# Patient Record
Sex: Male | Born: 1960
Health system: Southern US, Community
[De-identification: ages and names within clinical notes are randomized; demographics above are authoritative.]

## PROBLEM LIST (undated history)

## (undated) DIAGNOSIS — N529 Male erectile dysfunction, unspecified: Secondary | ICD-10-CM

## (undated) DIAGNOSIS — E1165 Type 2 diabetes mellitus with hyperglycemia: Secondary | ICD-10-CM

## (undated) DIAGNOSIS — K219 Gastro-esophageal reflux disease without esophagitis: Secondary | ICD-10-CM

## (undated) DIAGNOSIS — I639 Cerebral infarction, unspecified: Secondary | ICD-10-CM

## (undated) DIAGNOSIS — F1910 Other psychoactive substance abuse, uncomplicated: Secondary | ICD-10-CM

## (undated) DIAGNOSIS — G4730 Sleep apnea, unspecified: Secondary | ICD-10-CM

## (undated) DIAGNOSIS — E291 Testicular hypofunction: Secondary | ICD-10-CM

## (undated) DIAGNOSIS — M1990 Unspecified osteoarthritis, unspecified site: Secondary | ICD-10-CM

## (undated) DIAGNOSIS — R011 Cardiac murmur, unspecified: Secondary | ICD-10-CM

## (undated) DIAGNOSIS — I219 Acute myocardial infarction, unspecified: Secondary | ICD-10-CM

## (undated) DIAGNOSIS — I10 Essential (primary) hypertension: Secondary | ICD-10-CM

## (undated) DIAGNOSIS — E785 Hyperlipidemia, unspecified: Secondary | ICD-10-CM

## (undated) HISTORY — PX: COLONOSCOPY: SHX174

## (undated) HISTORY — DX: Cardiac murmur, unspecified: R01.1

## (undated) HISTORY — PX: OTHER SURGICAL HISTORY: SHX169

## (undated) HISTORY — DX: Hyperlipidemia, unspecified: E78.5

## (undated) HISTORY — DX: Type 2 diabetes mellitus with hyperglycemia: E11.65

## (undated) HISTORY — DX: Male erectile dysfunction, unspecified: N52.9

## (undated) HISTORY — DX: Testicular hypofunction: E29.1

## (undated) HISTORY — DX: Essential (primary) hypertension: I10

## (undated) HISTORY — PX: CARDIAC CATHETERIZATION: SHX172

## (undated) HISTORY — DX: Other psychoactive substance abuse, uncomplicated: F19.10

---

## 2000-03-06 ENCOUNTER — Encounter: Payer: Self-pay | Admitting: Family Medicine

## 2000-03-06 ENCOUNTER — Ambulatory Visit (HOSPITAL_COMMUNITY): Admission: RE | Admit: 2000-03-06 | Discharge: 2000-03-06 | Payer: Self-pay | Admitting: Family Medicine

## 2001-09-03 ENCOUNTER — Ambulatory Visit (HOSPITAL_BASED_OUTPATIENT_CLINIC_OR_DEPARTMENT_OTHER): Admission: RE | Admit: 2001-09-03 | Discharge: 2001-09-03 | Payer: Self-pay | Admitting: Family Medicine

## 2003-01-12 ENCOUNTER — Encounter: Admission: RE | Admit: 2003-01-12 | Discharge: 2003-04-12 | Payer: Self-pay | Admitting: Internal Medicine

## 2010-04-02 LAB — HM COLONOSCOPY

## 2010-04-02 LAB — HM DIABETES EYE EXAM

## 2010-08-22 LAB — HM COLONOSCOPY

## 2011-03-23 ENCOUNTER — Ambulatory Visit (INDEPENDENT_AMBULATORY_CARE_PROVIDER_SITE_OTHER): Payer: Managed Care, Other (non HMO)

## 2011-03-23 DIAGNOSIS — R509 Fever, unspecified: Secondary | ICD-10-CM

## 2011-03-23 DIAGNOSIS — E119 Type 2 diabetes mellitus without complications: Secondary | ICD-10-CM

## 2011-03-23 DIAGNOSIS — R197 Diarrhea, unspecified: Secondary | ICD-10-CM

## 2011-03-29 ENCOUNTER — Telehealth: Payer: Self-pay | Admitting: Internal Medicine

## 2011-03-29 NOTE — Telephone Encounter (Signed)
Received copies from [facility] 03/29/11. Forwarded 54pages to Dr. Candi Leash review.

## 2011-04-04 ENCOUNTER — Encounter: Payer: Self-pay | Admitting: Internal Medicine

## 2011-04-04 DIAGNOSIS — L659 Nonscarring hair loss, unspecified: Secondary | ICD-10-CM | POA: Insufficient documentation

## 2011-04-04 DIAGNOSIS — I10 Essential (primary) hypertension: Secondary | ICD-10-CM

## 2011-04-04 DIAGNOSIS — IMO0001 Reserved for inherently not codable concepts without codable children: Secondary | ICD-10-CM

## 2011-04-04 DIAGNOSIS — E785 Hyperlipidemia, unspecified: Secondary | ICD-10-CM

## 2011-04-04 DIAGNOSIS — E291 Testicular hypofunction: Secondary | ICD-10-CM

## 2011-04-04 DIAGNOSIS — Z0000 Encounter for general adult medical examination without abnormal findings: Secondary | ICD-10-CM | POA: Insufficient documentation

## 2011-04-04 DIAGNOSIS — E118 Type 2 diabetes mellitus with unspecified complications: Secondary | ICD-10-CM | POA: Insufficient documentation

## 2011-04-04 DIAGNOSIS — E1169 Type 2 diabetes mellitus with other specified complication: Secondary | ICD-10-CM | POA: Insufficient documentation

## 2011-04-04 DIAGNOSIS — N529 Male erectile dysfunction, unspecified: Secondary | ICD-10-CM

## 2011-04-04 HISTORY — DX: Male erectile dysfunction, unspecified: N52.9

## 2011-04-04 HISTORY — DX: Essential (primary) hypertension: I10

## 2011-04-04 HISTORY — DX: Hyperlipidemia, unspecified: E78.5

## 2011-04-04 HISTORY — DX: Reserved for inherently not codable concepts without codable children: IMO0001

## 2011-04-04 HISTORY — DX: Testicular hypofunction: E29.1

## 2011-04-05 ENCOUNTER — Ambulatory Visit (INDEPENDENT_AMBULATORY_CARE_PROVIDER_SITE_OTHER): Payer: Self-pay | Admitting: Internal Medicine

## 2011-04-05 ENCOUNTER — Other Ambulatory Visit: Payer: Self-pay | Admitting: Internal Medicine

## 2011-04-05 ENCOUNTER — Encounter: Payer: Self-pay | Admitting: Internal Medicine

## 2011-04-05 ENCOUNTER — Other Ambulatory Visit (INDEPENDENT_AMBULATORY_CARE_PROVIDER_SITE_OTHER): Payer: Managed Care, Other (non HMO)

## 2011-04-05 DIAGNOSIS — I10 Essential (primary) hypertension: Secondary | ICD-10-CM

## 2011-04-05 DIAGNOSIS — Z0000 Encounter for general adult medical examination without abnormal findings: Secondary | ICD-10-CM

## 2011-04-05 DIAGNOSIS — IMO0001 Reserved for inherently not codable concepts without codable children: Secondary | ICD-10-CM

## 2011-04-05 DIAGNOSIS — E291 Testicular hypofunction: Secondary | ICD-10-CM

## 2011-04-05 DIAGNOSIS — N529 Male erectile dysfunction, unspecified: Secondary | ICD-10-CM

## 2011-04-05 DIAGNOSIS — E785 Hyperlipidemia, unspecified: Secondary | ICD-10-CM

## 2011-04-05 LAB — URINALYSIS, ROUTINE W REFLEX MICROSCOPIC
Bilirubin Urine: NEGATIVE
Hgb urine dipstick: NEGATIVE
Ketones, ur: NEGATIVE
Leukocytes, UA: NEGATIVE
Nitrite: NEGATIVE
Specific Gravity, Urine: 1.02 (ref 1.000–1.030)
Total Protein, Urine: NEGATIVE
Urine Glucose: 250
Urobilinogen, UA: 0.2 (ref 0.0–1.0)
pH: 6 (ref 5.0–8.0)

## 2011-04-05 LAB — CBC WITH DIFFERENTIAL/PLATELET
Basophils Absolute: 0 10*3/uL (ref 0.0–0.1)
Basophils Relative: 0.4 % (ref 0.0–3.0)
Eosinophils Absolute: 0.1 10*3/uL (ref 0.0–0.7)
Eosinophils Relative: 1.6 % (ref 0.0–5.0)
HCT: 40.4 % (ref 39.0–52.0)
Hemoglobin: 14 g/dL (ref 13.0–17.0)
Lymphocytes Relative: 22.8 % (ref 12.0–46.0)
Lymphs Abs: 1.7 10*3/uL (ref 0.7–4.0)
MCHC: 34.6 g/dL (ref 30.0–36.0)
MCV: 94.1 fl (ref 78.0–100.0)
Monocytes Absolute: 0.5 10*3/uL (ref 0.1–1.0)
Monocytes Relative: 6.2 % (ref 3.0–12.0)
Neutro Abs: 5.3 10*3/uL (ref 1.4–7.7)
Neutrophils Relative %: 69 % (ref 43.0–77.0)
Platelets: 171 10*3/uL (ref 150.0–400.0)
RBC: 4.29 Mil/uL (ref 4.22–5.81)
RDW: 12.8 % (ref 11.5–14.6)
WBC: 7.7 10*3/uL (ref 4.5–10.5)

## 2011-04-05 LAB — LDL CHOLESTEROL, DIRECT: Direct LDL: 99.7 mg/dL

## 2011-04-05 LAB — COMPREHENSIVE METABOLIC PANEL
ALT: 49 U/L (ref 0–53)
AST: 30 U/L (ref 0–37)
Albumin: 4.1 g/dL (ref 3.5–5.2)
Alkaline Phosphatase: 73 U/L (ref 39–117)
BUN: 14 mg/dL (ref 6–23)
CO2: 28 mEq/L (ref 19–32)
Calcium: 9.3 mg/dL (ref 8.4–10.5)
Chloride: 105 mEq/L (ref 96–112)
Creatinine, Ser: 1.2 mg/dL (ref 0.4–1.5)
GFR: 69.77 mL/min (ref >60.00)
Glucose, Bld: 187 mg/dL — ABNORMAL HIGH (ref 70–99)
Potassium: 4.1 mEq/L (ref 3.5–5.1)
Sodium: 140 mEq/L (ref 135–145)
Total Bilirubin: 0.6 mg/dL (ref 0.3–1.2)
Total Protein: 6.4 g/dL (ref 6.0–8.3)

## 2011-04-05 LAB — TSH: TSH: 6.44 u[IU]/mL — ABNORMAL HIGH (ref 0.35–5.50)

## 2011-04-05 LAB — LIPID PANEL
Cholesterol: 172 mg/dL (ref 0–200)
HDL: 29.4 mg/dL — ABNORMAL LOW (ref >39.00)
Total CHOL/HDL Ratio: 6
Triglycerides: 277 mg/dL — ABNORMAL HIGH (ref 0.0–149.0)
VLDL: 55.4 mg/dL — ABNORMAL HIGH (ref 0.0–40.0)

## 2011-04-05 LAB — PSA: PSA: 0.85 ng/mL (ref 0.10–4.00)

## 2011-04-05 LAB — HM DIABETES FOOT EXAM: HM Diabetic Foot Exam: NORMAL

## 2011-04-05 MED ORDER — TESTOSTERONE CYPIONATE 200 MG/ML IM SOLN: 200.0000 mg | INTRAMUSCULAR | Status: DC

## 2011-04-05 NOTE — Patient Instructions (Signed)
Diabetes, Type 2 Diabetes is a long-lasting (chronic) disease. In type 2 diabetes, the pancreas does not make enough insulin (a hormone), and the body does not respond normally to the insulin that is made. This type of diabetes was also previously called adult-onset diabetes. It usually occurs after the age of 40, but it can occur at any age.  CAUSES  Type 2 diabetes happens because the pancreasis not making enough insulin or your body has trouble using the insulin that your pancreas does make properly. SYMPTOMS   Drinking more than usual.   Urinating more than usual.   Blurred vision.   Dry, itchy skin.   Frequent infections.   Feeling more tired than usual (fatigue).  DIAGNOSIS The diagnosis of type 2 diabetes is usually made by one of the following tests:  Fasting blood glucose test. You will not eat for at least 8 hours and then take a blood test.   Random blood glucose test. Your blood glucose (sugar) is checked at any time of the day regardless of when you ate.   Oral glucose tolerance test (OGTT). Your blood glucose is measured after you have not eaten (fasted) and then after you drink a glucose containing beverage.  TREATMENT   Healthy eating.   Exercise.   Medicine, if needed.   Monitoring blood glucose.   Seeing your caregiver regularly.  HOME CARE INSTRUCTIONS   Check your blood glucose at least once a day. More frequent monitoring may be necessary, depending on your medicines and on how well your diabetes is controlled. Your caregiver will advise you.   Take your medicine as directed by your caregiver.   Do not smoke.   Make wise food choices. Ask your caregiver for information. Weight loss can improve your diabetes.   Learn about low blood glucose (hypoglycemia) and how to treat it.   Get your eyes checked regularly.   Have a yearly physical exam. Have your blood pressure checked and your blood and urine tested.   Wear a pendant or bracelet saying  that you have diabetes.   Check your feet every night for cuts, sores, blisters, and redness. Let your caregiver know if you have any problems.  SEEK MEDICAL CARE IF:   You have problems keeping your blood glucose in target range.   You have problems with your medicines.   You have symptoms of an illness that do not improve after 24 hours.   You have a sore or wound that is not healing.   You notice a change in vision or a new problem with your vision.   You have a fever.  MAKE SURE YOU:  Understand these instructions.   Will watch your condition.   Will get help right away if you are not doing well or get worse.  Document Released: 03/19/2005 Document Revised: 11/30/2010 Document Reviewed: 09/04/2010 [employer] Patient Information 2012 [employer].Health Maintenance, Males A healthy lifestyle and preventative care can promote health and wellness.  Maintain regular health, dental, and eye exams.   Eat a healthy diet. Foods like vegetables, fruits, whole grains, low-fat dairy products, and lean protein foods contain the nutrients you need without too many calories. Decrease your intake of foods high in solid fats, added sugars, and salt. Get information about a proper diet from your caregiver, if necessary.   Regular physical exercise is one of the most important things you can do for your health. Most adults should get at least 150 minutes of moderate-intensity exercise (any activity   that increases your heart rate and causes you to sweat) each week. In addition, most adults need muscle-strengthening exercises on 2 or more days a week.    Maintain a healthy weight. The body mass index (BMI) is a screening tool to identify possible weight problems. It provides an estimate of body fat based on height and weight. Your caregiver can help determine your BMI, and can help you achieve or maintain a healthy weight. For adults 20 years and older:   A BMI below 18.5 is considered  underweight.   A BMI of 18.5 to 24.9 is normal.   A BMI of 25 to 29.9 is considered overweight.   A BMI of 30 and above is considered obese.   Maintain normal blood lipids and cholesterol by exercising and minimizing your intake of saturated fat. Eat a balanced diet with plenty of fruits and vegetables. Blood tests for lipids and cholesterol should begin at age 20 and be repeated every 5 years. If your lipid or cholesterol levels are high, you are over 50, or you are a high risk for heart disease, you may need your cholesterol levels checked more frequently.Ongoing high lipid and cholesterol levels should be treated with medicines, if diet and exercise are not effective.   If you smoke, find out from your caregiver how to quit. If you do not use tobacco, do not start.   If you choose to drink alcohol, do not exceed 2 drinks per day. One drink is considered to be 12 ounces (355 mL) of beer, 5 ounces (148 mL) of wine, or 1.5 ounces (44 mL) of liquor.   Avoid use of street drugs. Do not share needles with anyone. Ask for help if you need support or instructions about stopping the use of drugs.   High blood pressure causes heart disease and increases the risk of stroke. Blood pressure should be checked at least every 1 to 2 years. Ongoing high blood pressure should be treated with medicines if weight loss and exercise are not effective.   If you are 45 to 52 years old, ask your caregiver if you should take aspirin to prevent heart disease.   Diabetes screening involves taking a blood sample to check your fasting blood sugar level. This should be done once every 3 years, after age 45, if you are within normal weight and without risk factors for diabetes. Testing should be considered at a younger age or be carried out more frequently if you are overweight and have at least 1 risk factor for diabetes.   Colorectal cancer can be detected and often prevented. Most routine colorectal cancer screening  begins at the age of 50 and continues through age 75. However, your caregiver may recommend screening at an earlier age if you have risk factors for colon cancer. On a yearly basis, your caregiver may provide home test kits to check for hidden blood in the stool. Use of a small camera at the end of a tube, to directly examine the colon (sigmoidoscopy or colonoscopy), can detect the earliest forms of colorectal cancer. Talk to your caregiver about this at age 50, when routine screening begins. Direct examination of the colon should be repeated every 5 to 10 years through age 75, unless early forms of pre-cancerous polyps or small growths are found.   Healthy men should no longer receive prostate-specific antigen (PSA) blood tests as part of routine cancer screening. Consult with your caregiver about prostate cancer screening.   Practice safe sex. Use   condoms and avoid high-risk sexual practices to reduce the spread of sexually transmitted infections (STIs).   Use sunscreen with a sun protection factor (SPF) of 30 or greater. Apply sunscreen liberally and repeatedly throughout the day. You should seek shade when your shadow is shorter than you. Protect yourself by wearing long sleeves, pants, a wide-brimmed hat, and sunglasses year round, whenever you are outdoors.   Notify your caregiver of new moles or changes in moles, especially if there is a change in shape or color. Also notify your caregiver if a mole is larger than the size of a pencil eraser.   A one-time screening for abdominal aortic aneurysm (AAA) and surgical repair of large AAAs by sound wave imaging (ultrasonography) is recommended for ages 65 to 75 years who are current or former smokers.   Stay current with your immunizations.  Document Released: 09/15/2007 Document Revised: 11/29/2010 Document Reviewed: 08/14/2010 [employer] Patient Information 2012 [employer]. 

## 2011-04-05 NOTE — Assessment & Plan Note (Signed)
I will check his a1c and will monitor his renal function as well

## 2011-04-05 NOTE — Progress Notes (Signed)
Subjective:    Patient ID: Karl Morgan, male    DOB: 06-Feb-1961, 51 y.o.   MRN: [id number]  Diabetes He presents for his follow-up diabetic visit. He has type 2 diabetes mellitus. His disease course has been stable. There are no hypoglycemic associated symptoms. Pertinent negatives for hypoglycemia include no headaches, pallor or sweats. Associated symptoms include fatigue. Pertinent negatives for diabetes include no blurred vision, no chest pain, no foot paresthesias, no foot ulcerations, no polydipsia, no polyphagia, no polyuria, no visual change, no weakness and no weight loss. There are no hypoglycemic complications. Symptoms are stable. There are no diabetic complications. Current diabetic treatment includes oral agent (dual therapy). He is compliant with treatment most of the time. His weight is stable. He is following a generally healthy diet. Meal planning includes avoidance of concentrated sweets. He has not had a previous visit with a dietician. He participates in exercise intermittently. There is no change in his home blood glucose trend. An ACE inhibitor/angiotensin II receptor blocker is being taken. Eye exam is current.  Hypertension This is a chronic problem. The current episode started more than 1 year ago. The problem has been gradually improving since onset. The problem is controlled. Pertinent negatives include no anxiety, blurred vision, chest pain, headaches, malaise/fatigue, neck pain, orthopnea, palpitations, peripheral edema, PND, shortness of breath or sweats. Past treatments include ACE inhibitors. The current treatment provides moderate improvement. There are no compliance problems.  There is no history of chronic renal disease.  Hyperlipidemia This is a chronic problem. The current episode started more than 1 year ago. The problem is controlled. Recent lipid tests were reviewed and are variable. Exacerbating diseases include diabetes and obesity. He has no history of chronic  renal disease, hypothyroidism, liver disease or nephrotic syndrome. Pertinent negatives include no chest pain, focal sensory loss, focal weakness, leg pain, myalgias or shortness of breath. Current antihyperlipidemic treatment includes statins. The current treatment provides moderate improvement of lipids. Compliance problems include adherence to exercise and adherence to diet.       Review of Systems  Constitutional: Positive for fatigue. Negative for fever, chills, weight loss, malaise/fatigue, diaphoresis, activity change, appetite change and unexpected weight change.  HENT: Negative.  Negative for neck pain.   Eyes: Negative.  Negative for blurred vision.  Respiratory: Negative for apnea, cough, choking, chest tightness, shortness of breath, wheezing and stridor.   Cardiovascular: Negative for chest pain, palpitations, orthopnea, leg swelling and PND.  Gastrointestinal: Negative for nausea, vomiting, abdominal pain, diarrhea, constipation and blood in stool.  Genitourinary: Negative.  Negative for dysuria, urgency, polyuria, frequency, hematuria, flank pain, decreased urine volume, discharge, penile swelling, scrotal swelling, enuresis, difficulty urinating, genital sores, penile pain and testicular pain.  Musculoskeletal: Negative for myalgias, back pain, joint swelling, arthralgias and gait problem.  Skin: Negative for color change, pallor, rash and wound.  Neurological: Negative.  Negative for focal weakness, weakness and headaches.  Hematological: Negative for polydipsia, polyphagia and adenopathy. Does not bruise/bleed easily.  Psychiatric/Behavioral: Negative.        Objective:   Physical Exam  Vitals reviewed. Constitutional: He is oriented to person, place, and time. He appears well-developed and well-nourished. No distress.  HENT:  Head: Normocephalic and atraumatic.  Mouth/Throat: Oropharynx is clear and moist. No oropharyngeal exudate.  Eyes: Conjunctivae are normal. Right  eye exhibits no discharge. Left eye exhibits no discharge. No scleral icterus.  Neck: Normal range of motion. Neck supple. No JVD present. No tracheal deviation present. No thyromegaly present.  Cardiovascular: Normal rate, regular rhythm, normal heart sounds and intact distal pulses.  Exam reveals no gallop and no friction rub.   No murmur heard. Pulmonary/Chest: Effort normal and breath sounds normal. No stridor. No respiratory distress. He has no wheezes. He has no rales. He exhibits no tenderness.  Abdominal: Soft. Bowel sounds are normal. He exhibits no distension. There is no tenderness. There is no rebound and no guarding. Hernia confirmed negative in the right inguinal area and confirmed negative in the left inguinal area.  Genitourinary: Rectum normal, prostate normal, testes normal and penis normal. Rectal exam shows no external hemorrhoid, no internal hemorrhoid, no fissure, no mass, no tenderness and anal tone normal. Guaiac negative stool. Prostate is not enlarged and not tender. Right testis shows no mass, no swelling and no tenderness. Right testis is descended. Left testis shows no mass, no swelling and no tenderness. Left testis is descended. Circumcised. No penile tenderness. No discharge found.  Musculoskeletal: Normal range of motion. He exhibits no edema and no tenderness.  Lymphadenopathy:    He has no cervical adenopathy.       Right: No inguinal adenopathy present.       Left: No inguinal adenopathy present.  Neurological: He is oriented to person, place, and time.  Skin: Skin is warm and dry. No rash noted. He is not diaphoretic. No erythema. No pallor.  Psychiatric: He has a normal mood and affect. His behavior is normal. Judgment and thought content normal.          Assessment & Plan:

## 2011-04-05 NOTE — Assessment & Plan Note (Signed)
His BP is well controlled, I will check his lytes and renal function 

## 2011-04-05 NOTE — Assessment & Plan Note (Signed)
He has not been on the T for about 6 weeks and has noticed a recurrence of symptoms so I wrote an Rx today

## 2011-04-05 NOTE — Assessment & Plan Note (Signed)
Exam done, labs ordered, vaccines updated, pt ed material was given 

## 2011-04-05 NOTE — Assessment & Plan Note (Signed)
He is doing well on pravachol, I will check his labs today 

## 2011-04-06 ENCOUNTER — Ambulatory Visit: Payer: Managed Care, Other (non HMO)

## 2011-04-06 ENCOUNTER — Encounter: Payer: Self-pay | Admitting: Internal Medicine

## 2011-04-06 DIAGNOSIS — E291 Testicular hypofunction: Secondary | ICD-10-CM

## 2011-04-06 LAB — HEMOGLOBIN A1C: Hgb A1c MFr Bld: 8.3 % — ABNORMAL HIGH (ref 4.6–6.5)

## 2011-04-20 ENCOUNTER — Ambulatory Visit: Payer: Managed Care, Other (non HMO)

## 2011-04-20 DIAGNOSIS — E291 Testicular hypofunction: Secondary | ICD-10-CM

## 2011-04-20 MED ORDER — TESTOSTERONE CYPIONATE 200 MG/ML IM SOLN
200.0000 mg | INTRAMUSCULAR | Status: DC
  Administered 2011-04-20 – 2011-07-02 (×2): 200 mg via INTRAMUSCULAR

## 2011-04-20 MED ORDER — TESTOSTERONE CYPIONATE 200 MG/ML IM SOLN: 400.0000 mg | Freq: Once | INTRAMUSCULAR | Status: DC

## 2011-04-23 ENCOUNTER — Ambulatory Visit: Payer: Managed Care, Other (non HMO)

## 2011-04-23 LAB — HM DIABETES EYE EXAM: HM Diabetic Eye Exam: NORMAL

## 2011-05-04 ENCOUNTER — Encounter: Payer: Self-pay | Admitting: Internal Medicine

## 2011-05-04 ENCOUNTER — Ambulatory Visit (INDEPENDENT_AMBULATORY_CARE_PROVIDER_SITE_OTHER): Payer: Managed Care, Other (non HMO) | Admitting: Internal Medicine

## 2011-05-04 DIAGNOSIS — E785 Hyperlipidemia, unspecified: Secondary | ICD-10-CM

## 2011-05-04 DIAGNOSIS — I10 Essential (primary) hypertension: Secondary | ICD-10-CM

## 2011-05-04 DIAGNOSIS — E039 Hypothyroidism, unspecified: Secondary | ICD-10-CM | POA: Insufficient documentation

## 2011-05-04 DIAGNOSIS — N529 Male erectile dysfunction, unspecified: Secondary | ICD-10-CM

## 2011-05-04 DIAGNOSIS — IMO0001 Reserved for inherently not codable concepts without codable children: Secondary | ICD-10-CM

## 2011-05-04 DIAGNOSIS — E291 Testicular hypofunction: Secondary | ICD-10-CM

## 2011-05-04 MED ORDER — LEVOTHYROXINE SODIUM 25 MCG PO CAPS: 1.0000 | ORAL_CAPSULE | Freq: Every day | ORAL | Status: DC

## 2011-05-04 MED ORDER — SITAGLIP PHOS-METFORMIN HCL ER 50-1000 MG PO TB24: 1.0000 | ORAL_TABLET | Freq: Every day | ORAL | Status: DC

## 2011-05-04 MED ORDER — TADALAFIL 20 MG PO TABS: 20.0000 mg | ORAL_TABLET | Freq: Every day | ORAL | Status: AC | PRN

## 2011-05-04 NOTE — Assessment & Plan Note (Signed)
Continue T replacement

## 2011-05-04 NOTE — Assessment & Plan Note (Signed)
He will improve his lifestyle modifications

## 2011-05-04 NOTE — Assessment & Plan Note (Signed)
His BP is well controlled 

## 2011-05-04 NOTE — Assessment & Plan Note (Signed)
He wants to try a higher dose of cialis

## 2011-05-04 NOTE — Assessment & Plan Note (Signed)
BS has not been well controlled so have changed him to [employer]

## 2011-05-04 NOTE — Progress Notes (Signed)
Subjective:    Patient ID: Karl Morgan, male    DOB: 1960-04-20, 51 y.o.   MRN: [id number]  Diabetes He presents for his follow-up diabetic visit. He has type 2 diabetes mellitus. His disease course has been worsening. Pertinent negatives for hypoglycemia include no dizziness, headaches, pallor, seizures, speech difficulty or tremors. Pertinent negatives for diabetes include no blurred vision, no chest pain, no fatigue, no foot paresthesias, no foot ulcerations, no polydipsia, no polyphagia, no polyuria, no visual change, no weakness and no weight loss. There are no hypoglycemic complications. Symptoms are worsening. Diabetic complications include impotence. Current diabetic treatment includes oral agent (dual therapy). He is compliant with treatment all of the time. His weight is increasing steadily. He is following a generally healthy diet. Meal planning includes avoidance of concentrated sweets. He has not had a previous visit with a dietician. He never participates in exercise. There is no change in his home blood glucose trend. An ACE inhibitor/angiotensin II receptor blocker is being taken. He does not see a podiatrist.Eye exam is current.  Thyroid Problem Presents for follow-up visit. Symptoms include weight gain. Patient reports no anxiety, cold intolerance, constipation, diaphoresis, diarrhea, dry skin, fatigue, hair loss, heat intolerance, hoarse voice, leg swelling, nail problem, palpitations, tremors, visual change or weight loss. The symptoms have been worsening.      Review of Systems  Constitutional: Positive for weight gain. Negative for fever, chills, weight loss, diaphoresis, activity change, appetite change, fatigue and unexpected weight change.  HENT: Negative.  Negative for hoarse voice.   Eyes: Negative.  Negative for blurred vision.  Respiratory: Negative for apnea, cough, choking, chest tightness, shortness of breath, wheezing and stridor.   Cardiovascular: Negative for  chest pain, palpitations and leg swelling.  Gastrointestinal: Negative for nausea, vomiting, abdominal pain, diarrhea, constipation, blood in stool, abdominal distention and anal bleeding.  Genitourinary: Positive for impotence. Negative for polyuria.  Musculoskeletal: Negative for myalgias, back pain, joint swelling, arthralgias and gait problem.  Skin: Negative for color change, pallor, rash and wound.  Neurological: Negative for dizziness, tremors, seizures, syncope, facial asymmetry, speech difficulty, weakness, light-headedness, numbness and headaches.  Hematological: Negative for cold intolerance, heat intolerance, polydipsia, polyphagia and adenopathy. Does not bruise/bleed easily.  Psychiatric/Behavioral: Negative.        Objective:   Physical Exam  Vitals reviewed. Constitutional: He is oriented to person, place, and time. He appears well-developed and well-nourished. No distress.  HENT:  Head: Normocephalic and atraumatic.  Mouth/Throat: Oropharynx is clear and moist. No oropharyngeal exudate.  Eyes: Conjunctivae are normal. Right eye exhibits no discharge. Left eye exhibits no discharge. No scleral icterus.  Neck: Normal range of motion. Neck supple. No JVD present. No tracheal deviation present. No thyromegaly present.  Cardiovascular: Normal rate, normal heart sounds and intact distal pulses.  Exam reveals no gallop and no friction rub.   No murmur heard. Pulmonary/Chest: Effort normal and breath sounds normal. No stridor. No respiratory distress. He has no wheezes. He has no rales. He exhibits no tenderness.  Abdominal: Soft. Bowel sounds are normal. He exhibits no distension and no mass. There is no tenderness. There is no rebound and no guarding.  Musculoskeletal: Normal range of motion. He exhibits edema (trace edema in both legs). He exhibits no tenderness.  Lymphadenopathy:    He has no cervical adenopathy.  Neurological: He is oriented to person, place, and time.    Skin: Skin is warm and dry. No rash noted. He is not diaphoretic. No erythema. No  pallor.  Psychiatric: He has a normal mood and affect. His behavior is normal. Judgment and thought content normal.     Lab Results  Component Value Date   WBC 7.7 04/05/2011   HGB 14.0 04/05/2011   HCT 40.4 04/05/2011   PLT 171.0 04/05/2011   GLUCOSE 187* 04/05/2011   CHOL 172 04/05/2011   TRIG 277.0* 04/05/2011   HDL 29.40* 04/05/2011   LDLDIRECT 99.7 04/05/2011   ALT 49 04/05/2011   AST 30 04/05/2011   NA 140 04/05/2011   K 4.1 04/05/2011   CL 105 04/05/2011   CREATININE 1.2 04/05/2011   BUN 14 04/05/2011   CO2 28 04/05/2011   TSH 6.44* 04/05/2011   PSA 0.85 04/05/2011   HGBA1C 8.3* 04/05/2011       Assessment & Plan:

## 2011-05-04 NOTE — Patient Instructions (Signed)
Hypothyroidism The thyroid is a large gland located in the lower front of your neck. The thyroid gland helps control metabolism. Metabolism is how your body handles food. It controls metabolism with the hormone thyroxine. When this gland is underactive (hypothyroid), it produces too little hormone.  CAUSES These include:   Absence or destruction of thyroid tissue.   Goiter due to iodine deficiency.   Goiter due to medications.   Congenital defects (since birth).   Problems with the pituitary. This causes a lack of TSH (thyroid stimulating hormone). This hormone tells the thyroid to turn out more hormone.  SYMPTOMS  Lethargy (feeling as though you have no energy)   Cold intolerance   Weight gain (in spite of normal food intake)   Dry skin   Coarse hair   Menstrual irregularity (if severe, may lead to infertility)   Slowing of thought processes  Cardiac problems are also caused by insufficient amounts of thyroid hormone. Hypothyroidism in the newborn is cretinism, and is an extreme form. It is important that this form be treated adequately and immediately or it will lead rapidly to retarded physical and mental development. DIAGNOSIS  To prove hypothyroidism, your caregiver may do blood tests and ultrasound tests. Sometimes the signs are hidden. It may be necessary for your caregiver to watch this illness with blood tests either before or after diagnosis and treatment. TREATMENT  Low levels of thyroid hormone are increased by using synthetic thyroid hormone. This is a safe, effective treatment. It usually takes about four weeks to gain the full effects of the medication. After you have the full effect of the medication, it will generally take another four weeks for problems to leave. Your caregiver may start you on low doses. If you have had heart problems the dose may be gradually increased. It is generally not an emergency to get rapidly to normal. HOME CARE INSTRUCTIONS   Take  your medications as your caregiver suggests. Let your caregiver know of any medications you are taking or start taking. Your caregiver will help you with dosage schedules.   As your condition improves, your dosage needs may increase. It will be necessary to have continuing blood tests as suggested by your caregiver.   Report all suspected medication side effects to your caregiver.  SEEK MEDICAL CARE IF: Seek medical care if you develop:  Sweating.   Tremulousness (tremors).   Anxiety.   Rapid weight loss.   Heat intolerance.   Emotional swings.   Diarrhea.   Weakness.  SEEK IMMEDIATE MEDICAL CARE IF:  You develop chest pain, an irregular heart beat (palpitations), or a rapid heart beat. MAKE SURE YOU:   Understand these instructions.   Will watch your condition.   Will get help right away if you are not doing well or get worse.  Document Released: 03/19/2005 Document Revised: 11/29/2010 Document Reviewed: 11/07/2007 [facility] Patient Information 2012 [place], [place].Diabetes, Type 2 Diabetes is a long-lasting (chronic) disease. In type 2 diabetes, the pancreas does not make enough insulin (a hormone), and the body does not respond normally to the insulin that is made. This type of diabetes was also previously called adult-onset diabetes. It usually occurs after the age of 22, but it can occur at any age.  CAUSES  Type 2 diabetes happens because the pancreasis not making enough insulin or your body has trouble using the insulin that your pancreas does make properly. SYMPTOMS   Drinking more than usual.   Urinating more than usual.   Blurred  vision.   Dry, itchy skin.   Frequent infections.   Feeling more tired than usual (fatigue).  DIAGNOSIS The diagnosis of type 2 diabetes is usually made by one of the following tests:  Fasting blood glucose test. You will not eat for at least 8 hours and then take a blood test.   Random blood glucose test. Your blood glucose  (sugar) is checked at any time of the day regardless of when you ate.   Oral glucose tolerance test (OGTT). Your blood glucose is measured after you have not eaten (fasted) and then after you drink a glucose containing beverage.  TREATMENT   Healthy eating.   Exercise.   Medicine, if needed.   Monitoring blood glucose.   Seeing your caregiver regularly.  HOME CARE INSTRUCTIONS   Check your blood glucose at least once a day. More frequent monitoring may be necessary, depending on your medicines and on how well your diabetes is controlled. Your caregiver will advise you.   Take your medicine as directed by your caregiver.   Do not smoke.   Make wise food choices. Ask your caregiver for information. Weight loss can improve your diabetes.   Learn about low blood glucose (hypoglycemia) and how to treat it.   Get your eyes checked regularly.   Have a yearly physical exam. Have your blood pressure checked and your blood and urine tested.   Wear a pendant or bracelet saying that you have diabetes.   Check your feet every night for cuts, sores, blisters, and redness. Let your caregiver know if you have any problems.  SEEK MEDICAL CARE IF:   You have problems keeping your blood glucose in target range.   You have problems with your medicines.   You have symptoms of an illness that do not improve after 24 hours.   You have a sore or wound that is not healing.   You notice a change in vision or a new problem with your vision.   You have a fever.  MAKE SURE YOU:  Understand these instructions.   Will watch your condition.   Will get help right away if you are not doing well or get worse.  Document Released: 03/19/2005 Document Revised: 11/30/2010 Document Reviewed: 09/04/2010 [facility] Patient Information 2012 [place], [place].

## 2011-05-04 NOTE — Assessment & Plan Note (Signed)
Start tirosint 

## 2011-05-18 ENCOUNTER — Ambulatory Visit: Payer: Managed Care, Other (non HMO)

## 2011-05-23 ENCOUNTER — Ambulatory Visit (INDEPENDENT_AMBULATORY_CARE_PROVIDER_SITE_OTHER): Payer: Managed Care, Other (non HMO) | Admitting: *Deleted

## 2011-05-23 DIAGNOSIS — E291 Testicular hypofunction: Secondary | ICD-10-CM

## 2011-05-23 MED ORDER — TESTOSTERONE CYPIONATE 200 MG/ML IM SOLN
200.0000 mg | Freq: Once | INTRAMUSCULAR | Status: AC
  Administered 2011-05-23: 200 mg via INTRAMUSCULAR

## 2011-06-01 ENCOUNTER — Ambulatory Visit: Payer: Managed Care, Other (non HMO)

## 2011-06-05 ENCOUNTER — Ambulatory Visit: Payer: Managed Care, Other (non HMO) | Admitting: Internal Medicine

## 2011-06-13 ENCOUNTER — Ambulatory Visit: Payer: Managed Care, Other (non HMO)

## 2011-06-15 ENCOUNTER — Ambulatory Visit: Payer: Managed Care, Other (non HMO)

## 2011-06-29 ENCOUNTER — Ambulatory Visit: Payer: Managed Care, Other (non HMO)

## 2011-07-02 ENCOUNTER — Ambulatory Visit (INDEPENDENT_AMBULATORY_CARE_PROVIDER_SITE_OTHER): Payer: Managed Care, Other (non HMO)

## 2011-07-02 DIAGNOSIS — E291 Testicular hypofunction: Secondary | ICD-10-CM

## 2011-07-13 ENCOUNTER — Ambulatory Visit (INDEPENDENT_AMBULATORY_CARE_PROVIDER_SITE_OTHER): Payer: Managed Care, Other (non HMO) | Admitting: *Deleted

## 2011-07-13 DIAGNOSIS — N529 Male erectile dysfunction, unspecified: Secondary | ICD-10-CM

## 2011-07-13 DIAGNOSIS — E291 Testicular hypofunction: Secondary | ICD-10-CM

## 2011-07-13 MED ORDER — TESTOSTERONE CYPIONATE 200 MG/ML IM SOLN
200.0000 mg | INTRAMUSCULAR | Status: DC
  Administered 2011-07-13 – 2011-11-23 (×3): 200 mg via INTRAMUSCULAR

## 2011-07-27 ENCOUNTER — Ambulatory Visit (INDEPENDENT_AMBULATORY_CARE_PROVIDER_SITE_OTHER): Payer: Managed Care, Other (non HMO)

## 2011-07-27 DIAGNOSIS — E291 Testicular hypofunction: Secondary | ICD-10-CM

## 2011-07-27 DIAGNOSIS — N529 Male erectile dysfunction, unspecified: Secondary | ICD-10-CM

## 2011-08-03 ENCOUNTER — Ambulatory Visit (INDEPENDENT_AMBULATORY_CARE_PROVIDER_SITE_OTHER): Payer: Managed Care, Other (non HMO) | Admitting: Internal Medicine

## 2011-08-03 ENCOUNTER — Encounter: Payer: Self-pay | Admitting: Internal Medicine

## 2011-08-03 ENCOUNTER — Other Ambulatory Visit (INDEPENDENT_AMBULATORY_CARE_PROVIDER_SITE_OTHER): Payer: Managed Care, Other (non HMO)

## 2011-08-03 VITALS — BP 130/80 | HR 80 | Temp 97.6°F | Resp 16 | Wt 261.0 lb

## 2011-08-03 DIAGNOSIS — N529 Male erectile dysfunction, unspecified: Secondary | ICD-10-CM

## 2011-08-03 DIAGNOSIS — E039 Hypothyroidism, unspecified: Secondary | ICD-10-CM

## 2011-08-03 DIAGNOSIS — I10 Essential (primary) hypertension: Secondary | ICD-10-CM

## 2011-08-03 DIAGNOSIS — IMO0001 Reserved for inherently not codable concepts without codable children: Secondary | ICD-10-CM

## 2011-08-03 DIAGNOSIS — E785 Hyperlipidemia, unspecified: Secondary | ICD-10-CM

## 2011-08-03 LAB — COMPREHENSIVE METABOLIC PANEL
ALT: 33 U/L (ref 0–53)
AST: 20 U/L (ref 0–37)
Albumin: 4.5 g/dL (ref 3.5–5.2)
Alkaline Phosphatase: 70 U/L (ref 39–117)
BUN: 13 mg/dL (ref 6–23)
CO2: 28 mEq/L (ref 19–32)
Calcium: 9.4 mg/dL (ref 8.4–10.5)
Chloride: 101 mEq/L (ref 96–112)
Creatinine, Ser: 1.1 mg/dL (ref 0.4–1.5)
GFR: 74.82 mL/min (ref >60.00)
Glucose, Bld: 119 mg/dL — ABNORMAL HIGH (ref 70–99)
Potassium: 3.9 mEq/L (ref 3.5–5.1)
Sodium: 138 mEq/L (ref 135–145)
Total Bilirubin: 0.7 mg/dL (ref 0.3–1.2)
Total Protein: 7.4 g/dL (ref 6.0–8.3)

## 2011-08-03 LAB — LIPID PANEL
Cholesterol: 170 mg/dL (ref 0–200)
HDL: 29.4 mg/dL — ABNORMAL LOW (ref >39.00)
LDL Cholesterol: 102 mg/dL — ABNORMAL HIGH (ref 0–99)
Total CHOL/HDL Ratio: 6
Triglycerides: 195 mg/dL — ABNORMAL HIGH (ref 0.0–149.0)
VLDL: 39 mg/dL (ref 0.0–40.0)

## 2011-08-03 LAB — HEMOGLOBIN A1C: Hgb A1c MFr Bld: 8.9 % — ABNORMAL HIGH (ref 4.6–6.5)

## 2011-08-03 LAB — TSH: TSH: 6.47 u[IU]/mL — ABNORMAL HIGH (ref 0.35–5.50)

## 2011-08-03 MED ORDER — VARDENAFIL HCL 10 MG PO TABS: 10.0000 mg | ORAL_TABLET | Freq: Every day | ORAL | Status: DC | PRN

## 2011-08-03 NOTE — Patient Instructions (Signed)

## 2011-08-03 NOTE — Progress Notes (Signed)
Subjective:    Patient ID: Karl Morgan, male    DOB: [birth year], 51 y.o.   MRN: [id number]  Erectile Dysfunction This is a chronic problem. The current episode started more than 1 year ago. The problem is unchanged. The nature of his difficulty is achieving erection, maintaining erection and penetration. He reports no anxiety, decreased libido or performance anxiety. He reports his erection duration to be 1 to 5 minutes. Irritative symptoms do not include frequency, nocturia or urgency. Obstructive symptoms do not include dribbling, incomplete emptying, an intermittent stream, a slower stream, straining or a weak stream. Pertinent negatives include no chills, dysuria, genital pain, hematuria, hesitancy or inability to urinate. Past treatments include tadalafil. The treatment provided significant relief. He has had no adverse reactions caused by medications. Risk factors include diabetes mellitus and hypertension.  Diabetes He presents for his follow-up diabetic visit. He has type 2 diabetes mellitus. His disease course has been improving. There are no hypoglycemic associated symptoms. Pertinent negatives for hypoglycemia include no dizziness, headaches, seizures, speech difficulty or tremors. Pertinent negatives for diabetes include no blurred vision, no chest pain, no fatigue, no foot paresthesias, no foot ulcerations, no polydipsia, no polyphagia, no polyuria, no visual change, no weakness and no weight loss. There are no hypoglycemic complications. Symptoms are stable. Diabetic complications include impotence. Current diabetic treatment includes oral agent (triple therapy). He is compliant with treatment all of the time. His weight is increasing steadily. He is following a generally unhealthy diet. When asked about meal planning, he reported none. He never participates in exercise. There is no change in his home blood glucose trend. An ACE inhibitor/angiotensin II receptor blocker is being taken. He  does not see a podiatrist.Eye exam is current.      Review of Systems  Constitutional: Positive for unexpected weight change (some weight gain). Negative for fever, chills, weight loss, diaphoresis, activity change, appetite change and fatigue.  HENT: Negative.   Eyes: Negative.  Negative for blurred vision.  Respiratory: Negative for cough, chest tightness, shortness of breath, wheezing and stridor.   Cardiovascular: Negative for chest pain, palpitations and leg swelling.  Gastrointestinal: Negative.   Genitourinary: Positive for impotence. Negative for dysuria, hesitancy, urgency, polyuria, frequency, hematuria, flank pain, decreased urine volume, enuresis, difficulty urinating, decreased libido, incomplete emptying and nocturia.  Musculoskeletal: Negative for myalgias, back pain, joint swelling, arthralgias and gait problem.  Skin: Negative.   Neurological: Negative for dizziness, tremors, seizures, syncope, facial asymmetry, speech difficulty, weakness, light-headedness, numbness and headaches.  Hematological: Negative for polydipsia, polyphagia and adenopathy. Does not bruise/bleed easily.  Psychiatric/Behavioral: Negative.        Objective:   Physical Exam  Vitals reviewed. Constitutional: He is oriented to person, place, and time. He appears well-developed and well-nourished. No distress.  HENT:  Head: Normocephalic and atraumatic.  Mouth/Throat: Oropharynx is clear and moist. No oropharyngeal exudate.  Eyes: Conjunctivae are normal. Right eye exhibits no discharge. Left eye exhibits no discharge. No scleral icterus.  Neck: Normal range of motion. Neck supple. No JVD present. No tracheal deviation present. No thyromegaly present.  Cardiovascular: Normal rate, regular rhythm, normal heart sounds and intact distal pulses.  Exam reveals no gallop and no friction rub.   No murmur heard. Pulmonary/Chest: Effort normal and breath sounds normal. No stridor. No respiratory distress. He  has no wheezes. He has no rales. He exhibits no tenderness.  Abdominal: Soft. Bowel sounds are normal. He exhibits no distension and no mass. There is no tenderness. There  is no rebound and no guarding.  Musculoskeletal: Normal range of motion. He exhibits edema (trace edema in BLE). He exhibits no tenderness.  Lymphadenopathy:    He has no cervical adenopathy.  Neurological: He is oriented to person, place, and time.  Skin: Skin is warm and dry. No rash noted. He is not diaphoretic. No erythema. No pallor.  Psychiatric: He has a normal mood and affect. His behavior is normal. Judgment and thought content normal.      Lab Results  Component Value Date   WBC 7.7 04/05/2011   HGB 14.0 04/05/2011   HCT 40.4 04/05/2011   PLT 171.0 04/05/2011   GLUCOSE 187* 04/05/2011   CHOL 172 04/05/2011   TRIG 277.0* 04/05/2011   HDL 29.40* 04/05/2011   LDLDIRECT 99.7 04/05/2011   ALT 49 04/05/2011   AST 30 04/05/2011   NA 140 04/05/2011   K 4.1 04/05/2011   CL 105 04/05/2011   CREATININE 1.2 04/05/2011   BUN 14 04/05/2011   CO2 28 04/05/2011   TSH 6.44* 04/05/2011   PSA 0.85 04/05/2011   HGBA1C 8.3* 04/05/2011      Assessment & Plan:

## 2011-08-03 NOTE — Assessment & Plan Note (Signed)
I will check his a1c and renal function today

## 2011-08-03 NOTE — Assessment & Plan Note (Addendum)
TSh today is high so I have increased his dose

## 2011-08-03 NOTE — Assessment & Plan Note (Signed)
I will recheck his trigs today 

## 2011-08-03 NOTE — Assessment & Plan Note (Signed)
Try levitra

## 2011-08-03 NOTE — Assessment & Plan Note (Signed)
BP is well controlled 

## 2011-08-05 ENCOUNTER — Encounter: Payer: Self-pay | Admitting: Internal Medicine

## 2011-08-05 MED ORDER — LEVOTHYROXINE SODIUM 50 MCG PO TABS: 50.0000 ug | ORAL_TABLET | Freq: Every day | ORAL | Status: DC

## 2011-08-05 NOTE — Progress Notes (Signed)
Addended by: Etta Grandchild on: 08/05/2011 10:32 AM   Modules accepted: Orders

## 2011-08-10 ENCOUNTER — Ambulatory Visit: Payer: Managed Care, Other (non HMO)

## 2011-08-13 ENCOUNTER — Other Ambulatory Visit: Payer: Self-pay | Admitting: Internal Medicine

## 2011-08-13 ENCOUNTER — Ambulatory Visit (INDEPENDENT_AMBULATORY_CARE_PROVIDER_SITE_OTHER): Payer: Managed Care, Other (non HMO) | Admitting: Internal Medicine

## 2011-08-13 VITALS — BP 137/89 | HR 73 | Temp 98.4°F | Resp 18 | Ht 72.25 in | Wt 264.0 lb

## 2011-08-13 DIAGNOSIS — L03211 Cellulitis of face: Secondary | ICD-10-CM

## 2011-08-13 DIAGNOSIS — L0201 Cutaneous abscess of face: Secondary | ICD-10-CM

## 2011-08-13 MED ORDER — DOXYCYCLINE HYCLATE 100 MG PO TABS: 100.0000 mg | ORAL_TABLET | Freq: Two times a day (BID) | ORAL | Status: AC

## 2011-08-13 NOTE — Progress Notes (Signed)
  Subjective:    Patient ID: Karl Morgan [place], male    DOB: 1960/05/10, 51 y.o.   MRN: [id number]  HPI Swollen and tender right malar area x4 days/no fever/no injury/no regional pain in nose or teeth   Review of SystemsDM     Objective:   Physical Exam 2 cm x 2 cm induration in the right malar area without redness-tender to pressure Nares clear Right upper teeth and lungs clear No preauricular nodes Lanced with an 18-gauge equals no pus, just blood       Assessment & Plan:  Problem #1 early abscess right malar Cultures sent Start doxycycline 100 twice a day #20 Hot compresses 5 minutes twice a day Follow up 5-7 days if not resolved

## 2011-08-16 LAB — WOUND CULTURE
Gram Stain: NONE SEEN
Gram Stain: NONE SEEN
Gram Stain: NONE SEEN

## 2011-08-24 ENCOUNTER — Ambulatory Visit: Payer: Managed Care, Other (non HMO)

## 2011-09-07 ENCOUNTER — Ambulatory Visit: Payer: Managed Care, Other (non HMO)

## 2011-09-11 ENCOUNTER — Ambulatory Visit (INDEPENDENT_AMBULATORY_CARE_PROVIDER_SITE_OTHER): Payer: Managed Care, Other (non HMO)

## 2011-09-11 DIAGNOSIS — E291 Testicular hypofunction: Secondary | ICD-10-CM

## 2011-09-11 MED ORDER — TESTOSTERONE CYPIONATE 200 MG/ML IM SOLN
200.0000 mg | Freq: Once | INTRAMUSCULAR | Status: AC
  Administered 2011-09-11: 200 mg via INTRAMUSCULAR

## 2011-09-24 ENCOUNTER — Ambulatory Visit (INDEPENDENT_AMBULATORY_CARE_PROVIDER_SITE_OTHER): Payer: Managed Care, Other (non HMO)

## 2011-09-24 DIAGNOSIS — E291 Testicular hypofunction: Secondary | ICD-10-CM

## 2011-09-24 MED ORDER — TESTOSTERONE CYPIONATE 200 MG/ML IM SOLN
200.0000 mg | Freq: Once | INTRAMUSCULAR | Status: AC
  Administered 2011-09-24: 200 mg via INTRAMUSCULAR

## 2011-09-25 ENCOUNTER — Ambulatory Visit: Payer: Managed Care, Other (non HMO)

## 2011-10-09 ENCOUNTER — Ambulatory Visit: Payer: Managed Care, Other (non HMO)

## 2011-10-11 ENCOUNTER — Ambulatory Visit (INDEPENDENT_AMBULATORY_CARE_PROVIDER_SITE_OTHER): Payer: Managed Care, Other (non HMO) | Admitting: *Deleted

## 2011-10-11 DIAGNOSIS — E291 Testicular hypofunction: Secondary | ICD-10-CM

## 2011-10-11 MED ORDER — TESTOSTERONE CYPIONATE 200 MG/ML IM SOLN
200.0000 mg | Freq: Once | INTRAMUSCULAR | Status: AC
  Administered 2011-10-11: 200 mg via INTRAMUSCULAR

## 2011-10-25 ENCOUNTER — Ambulatory Visit: Payer: Managed Care, Other (non HMO)

## 2011-10-26 ENCOUNTER — Ambulatory Visit (INDEPENDENT_AMBULATORY_CARE_PROVIDER_SITE_OTHER): Payer: Managed Care, Other (non HMO)

## 2011-10-26 DIAGNOSIS — E291 Testicular hypofunction: Secondary | ICD-10-CM

## 2011-10-26 MED ORDER — TESTOSTERONE CYPIONATE 200 MG/ML IM SOLN
200.0000 mg | Freq: Once | INTRAMUSCULAR | Status: AC
  Administered 2011-10-26: 200 mg via INTRAMUSCULAR

## 2011-11-08 ENCOUNTER — Ambulatory Visit (INDEPENDENT_AMBULATORY_CARE_PROVIDER_SITE_OTHER): Payer: Managed Care, Other (non HMO)

## 2011-11-08 DIAGNOSIS — E291 Testicular hypofunction: Secondary | ICD-10-CM

## 2011-11-08 MED ORDER — TESTOSTERONE CYPIONATE 200 MG/ML IM SOLN
200.0000 mg | Freq: Once | INTRAMUSCULAR | Status: AC
  Administered 2011-11-08: 200 mg via INTRAMUSCULAR

## 2011-11-20 ENCOUNTER — Telehealth: Payer: Self-pay | Admitting: Internal Medicine

## 2011-11-20 NOTE — Telephone Encounter (Signed)
The pt is coming from [place] on Friday afternoon.  He was very hopeful we could give him his testosterone shot on his way back to his house.  I let him know we dont usually do shots on Friday afternoon, but we would try to accomodate his request.   Is this okay?

## 2011-11-22 ENCOUNTER — Ambulatory Visit: Payer: Managed Care, Other (non HMO)

## 2011-11-23 ENCOUNTER — Ambulatory Visit (INDEPENDENT_AMBULATORY_CARE_PROVIDER_SITE_OTHER): Payer: Managed Care, Other (non HMO)

## 2011-11-23 DIAGNOSIS — E291 Testicular hypofunction: Secondary | ICD-10-CM

## 2011-12-05 ENCOUNTER — Ambulatory Visit (INDEPENDENT_AMBULATORY_CARE_PROVIDER_SITE_OTHER)
Admission: RE | Admit: 2011-12-05 | Discharge: 2011-12-05 | Disposition: A | Discharge status: Home / Self Care | Payer: Managed Care, Other (non HMO) | Source: Ambulatory Visit | Attending: Internal Medicine | Admitting: Internal Medicine

## 2011-12-05 ENCOUNTER — Other Ambulatory Visit (INDEPENDENT_AMBULATORY_CARE_PROVIDER_SITE_OTHER): Payer: Managed Care, Other (non HMO)

## 2011-12-05 ENCOUNTER — Encounter: Payer: Self-pay | Admitting: Internal Medicine

## 2011-12-05 ENCOUNTER — Ambulatory Visit (INDEPENDENT_AMBULATORY_CARE_PROVIDER_SITE_OTHER): Payer: Managed Care, Other (non HMO) | Admitting: Internal Medicine

## 2011-12-05 VITALS — BP 148/98 | HR 94 | Temp 97.5°F | Resp 16 | Wt 254.0 lb

## 2011-12-05 DIAGNOSIS — E291 Testicular hypofunction: Secondary | ICD-10-CM

## 2011-12-05 DIAGNOSIS — I10 Essential (primary) hypertension: Secondary | ICD-10-CM

## 2011-12-05 DIAGNOSIS — E039 Hypothyroidism, unspecified: Secondary | ICD-10-CM

## 2011-12-05 DIAGNOSIS — R200 Anesthesia of skin: Secondary | ICD-10-CM

## 2011-12-05 DIAGNOSIS — R209 Unspecified disturbances of skin sensation: Secondary | ICD-10-CM

## 2011-12-05 DIAGNOSIS — E785 Hyperlipidemia, unspecified: Secondary | ICD-10-CM

## 2011-12-05 DIAGNOSIS — M542 Cervicalgia: Secondary | ICD-10-CM | POA: Insufficient documentation

## 2011-12-05 DIAGNOSIS — R202 Paresthesia of skin: Secondary | ICD-10-CM

## 2011-12-05 DIAGNOSIS — IMO0001 Reserved for inherently not codable concepts without codable children: Secondary | ICD-10-CM

## 2011-12-05 DIAGNOSIS — N529 Male erectile dysfunction, unspecified: Secondary | ICD-10-CM

## 2011-12-05 LAB — CBC WITH DIFFERENTIAL/PLATELET
Basophils Absolute: 0 10*3/uL (ref 0.0–0.1)
Basophils Relative: 0.4 % (ref 0.0–3.0)
Eosinophils Absolute: 0.1 10*3/uL (ref 0.0–0.7)
Eosinophils Relative: 1 % (ref 0.0–5.0)
HCT: 49.3 % (ref 39.0–52.0)
Hemoglobin: 16.5 g/dL (ref 13.0–17.0)
Lymphocytes Relative: 21.6 % (ref 12.0–46.0)
Lymphs Abs: 1.7 10*3/uL (ref 0.7–4.0)
MCHC: 33.5 g/dL (ref 30.0–36.0)
MCV: 92.8 fl (ref 78.0–100.0)
Monocytes Absolute: 0.6 10*3/uL (ref 0.1–1.0)
Monocytes Relative: 7.8 % (ref 3.0–12.0)
Neutro Abs: 5.4 10*3/uL (ref 1.4–7.7)
Neutrophils Relative %: 69.2 % (ref 43.0–77.0)
Platelets: 176 10*3/uL (ref 150.0–400.0)
RBC: 5.31 Mil/uL (ref 4.22–5.81)
RDW: 12.6 % (ref 11.5–14.6)
WBC: 7.9 10*3/uL (ref 4.5–10.5)

## 2011-12-05 LAB — URINALYSIS, ROUTINE W REFLEX MICROSCOPIC
Bilirubin Urine: NEGATIVE
Hgb urine dipstick: NEGATIVE
Ketones, ur: NEGATIVE
Leukocytes, UA: NEGATIVE
Nitrite: NEGATIVE
Specific Gravity, Urine: 1.015 (ref 1.000–1.030)
Total Protein, Urine: NEGATIVE
Urine Glucose: >=1000
Urobilinogen, UA: 0.2 (ref 0.0–1.0)
pH: 6 (ref 5.0–8.0)

## 2011-12-05 LAB — VITAMIN B12: Vitamin B-12: 251 pg/mL (ref 211–911)

## 2011-12-05 LAB — HEMOGLOBIN A1C: Hgb A1c MFr Bld: 9.9 % — ABNORMAL HIGH (ref 4.6–6.5)

## 2011-12-05 LAB — FOLATE: Folate: 13.7 ng/mL (ref >5.9)

## 2011-12-05 LAB — TSH: TSH: 5.43 u[IU]/mL (ref 0.35–5.50)

## 2011-12-05 MED ORDER — GLUCOSE BLOOD VI STRP: ORAL_STRIP | Status: AC

## 2011-12-05 MED ORDER — SILDENAFIL CITRATE 100 MG PO TABS: 100.0000 mg | ORAL_TABLET | Freq: Every day | ORAL | Status: DC | PRN

## 2011-12-05 MED ORDER — TESTOSTERONE CYPIONATE 200 MG/ML IM SOLN
200.0000 mg | Freq: Once | INTRAMUSCULAR | Status: AC
  Administered 2011-12-05: 200 mg via INTRAMUSCULAR

## 2011-12-05 MED ORDER — OLMESARTAN MEDOXOMIL 40 MG PO TABS: 40.0000 mg | ORAL_TABLET | Freq: Every day | ORAL | Status: DC

## 2011-12-05 MED ORDER — BAYER CONTOUR NEXT EZ W/DEVICE KIT: 1.0000 | PACK | Freq: Two times a day (BID) | Status: DC

## 2011-12-05 NOTE — Assessment & Plan Note (Signed)
His BP is not well controlled due to non-compliance, I will check his labs today to look for secondary cause and end organ damage, he will start Benicar

## 2011-12-05 NOTE — Assessment & Plan Note (Signed)
He has responded well to viagra

## 2011-12-05 NOTE — Assessment & Plan Note (Signed)
I will check his testosterone level today. 

## 2011-12-05 NOTE — Patient Instructions (Signed)
Diabetes, Type 2 Diabetes is a long-lasting (chronic) disease. In type 2 diabetes, the pancreas does not make enough insulin (a hormone), and the body does not respond normally to the insulin that is made. This type of diabetes was also previously called adult-onset diabetes. It usually occurs after the age of 40, but it can occur at any age.  CAUSES  Type 2 diabetes happens because the pancreasis not making enough insulin or your body has trouble using the insulin that your pancreas does make properly. SYMPTOMS   Drinking more than usual.   Urinating more than usual.   Blurred vision.   Dry, itchy skin.   Frequent infections.   Feeling more tired than usual (fatigue).  DIAGNOSIS The diagnosis of type 2 diabetes is usually made by one of the following tests:  Fasting blood glucose test. You will not eat for at least 8 hours and then take a blood test.   Random blood glucose test. Your blood glucose (sugar) is checked at any time of the day regardless of when you ate.   Oral glucose tolerance test (OGTT). Your blood glucose is measured after you have not eaten (fasted) and then after you drink a glucose containing beverage.  TREATMENT   Healthy eating.   Exercise.   Medicine, if needed.   Monitoring blood glucose.   Seeing your caregiver regularly.  HOME CARE INSTRUCTIONS   Check your blood glucose at least once a day. More frequent monitoring may be necessary, depending on your medicines and on how well your diabetes is controlled. Your caregiver will advise you.   Take your medicine as directed by your caregiver.   Do not smoke.   Make wise food choices. Ask your caregiver for information. Weight loss can improve your diabetes.   Learn about low blood glucose (hypoglycemia) and how to treat it.   Get your eyes checked regularly.   Have a yearly physical exam. Have your blood pressure checked and your blood and urine tested.   Wear a pendant or bracelet saying  that you have diabetes.   Check your feet every night for cuts, sores, blisters, and redness. Let your caregiver know if you have any problems.  SEEK MEDICAL CARE IF:   You have problems keeping your blood glucose in target range.   You have problems with your medicines.   You have symptoms of an illness that do not improve after 24 hours.   You have a sore or wound that is not healing.   You notice a change in vision or a new problem with your vision.   You have a fever.  MAKE SURE YOU:  Understand these instructions.   Will watch your condition.   Will get help right away if you are not doing well or get worse.  Document Released: 03/19/2005 Document Revised: 03/08/2011 Document Reviewed: 09/04/2010 [employer] Patient Information 2012 [employer].Hypertension As your heart beats, it forces blood through your arteries. This force is your blood pressure. If the pressure is too high, it is called hypertension (HTN) or high blood pressure. HTN is dangerous because you may have it and not know it. High blood pressure may mean that your heart has to work harder to pump blood. Your arteries may be narrow or stiff. The extra work puts you at risk for heart disease, stroke, and other problems.  Blood pressure consists of two numbers, a higher number over a lower, 110/72, for example. It is stated as "110 over 72." The ideal   is below 120 for the top number (systolic) and under 80 for the bottom (diastolic). Write down your blood pressure today. You should pay close attention to your blood pressure if you have certain conditions such as:  Heart failure.   Prior heart attack.   Diabetes   Chronic kidney disease.   Prior stroke.   Multiple risk factors for heart disease.  To see if you have HTN, your blood pressure should be measured while you are seated with your arm held at the level of the heart. It should be measured at least twice. A one-time elevated blood pressure reading  (especially in the Emergency Department) does not mean that you need treatment. There may be conditions in which the blood pressure is different between your right and left arms. It is important to see your caregiver soon for a recheck. Most people have essential hypertension which means that there is not a specific cause. This type of high blood pressure may be lowered by changing lifestyle factors such as:  Stress.   Smoking.   Lack of exercise.   Excessive weight.   Drug/tobacco/alcohol use.   Eating less salt.  Most people do not have symptoms from high blood pressure until it has caused damage to the body. Effective treatment can often prevent, delay or reduce that damage. TREATMENT  When a cause has been identified, treatment for high blood pressure is directed at the cause. There are a large number of medications to treat HTN. These fall into several categories, and your caregiver will help you select the medicines that are best for you. Medications may have side effects. You should review side effects with your caregiver. If your blood pressure stays high after you have made lifestyle changes or started on medicines,   Your medication(s) may need to be changed.   Other problems may need to be addressed.   Be certain you understand your prescriptions, and know how and when to take your medicine.   Be sure to follow up with your caregiver within the time frame advised (usually within two weeks) to have your blood pressure rechecked and to review your medications.   If you are taking more than one medicine to lower your blood pressure, make sure you know how and at what times they should be taken. Taking two medicines at the same time can result in blood pressure that is too low.  SEEK IMMEDIATE MEDICAL CARE IF:  You develop a severe headache, blurred or changing vision, or confusion.   You have unusual weakness or numbness, or a faint feeling.   You have severe chest or  abdominal pain, vomiting, or breathing problems.  MAKE SURE YOU:   Understand these instructions.   Will watch your condition.   Will get help right away if you are not doing well or get worse.  Document Released: 03/19/2005 Document Revised: 03/08/2011 Document Reviewed: 11/07/2007 [employer] Patient Information 2012 [employer]. 

## 2011-12-05 NOTE — Assessment & Plan Note (Signed)
I will check plain films of his neck to look for cervical causes and will check his labs to see if he has any metabolic causes such as B12 deficiency

## 2011-12-05 NOTE — Assessment & Plan Note (Signed)
I will check his TSH level today and will address if needed

## 2011-12-05 NOTE — Assessment & Plan Note (Signed)
Plain films today to look for ddd, spurs, etc.

## 2011-12-05 NOTE — Assessment & Plan Note (Signed)
He had some muscle aches on pravachol and has stopped taking it

## 2011-12-05 NOTE — Assessment & Plan Note (Signed)
He has decided to stop all meds and wants to check his a1c today and start anew

## 2011-12-05 NOTE — Progress Notes (Signed)
Subjective:    Patient ID: Karl Morgan [place], male    DOB: 10-30-1960, 51 y.o.   MRN: [id number]  Diabetes He presents for his follow-up diabetic visit. He has type 2 diabetes mellitus. His disease course has been worsening. There are no hypoglycemic associated symptoms. Pertinent negatives for hypoglycemia include no dizziness, headaches, seizures, speech difficulty, sweats or tremors. Associated symptoms include polydipsia, polyphagia and polyuria. Pertinent negatives for diabetes include no blurred vision, no chest pain, no fatigue, no foot paresthesias, no foot ulcerations, no visual change, no weakness and no weight loss. There are no hypoglycemic complications. Symptoms are worsening. There are no diabetic complications. He is compliant with treatment none of the time. His weight is stable. He is following a generally healthy diet. He participates in exercise intermittently. An ACE inhibitor/angiotensin II receptor blocker is not being taken. He does not see a podiatrist.Eye exam is current.  Hypertension This is a chronic problem. The current episode started more than 1 year ago. The problem has been gradually improving since onset. The problem is uncontrolled. Associated symptoms include neck pain. Pertinent negatives include no anxiety, blurred vision, chest pain, headaches, malaise/fatigue, orthopnea, palpitations, peripheral edema, PND, shortness of breath or sweats. Past treatments include nothing. The current treatment provides no improvement. Compliance problems include psychosocial issues, exercise and diet.       Review of Systems  Constitutional: Negative for fever, chills, weight loss, malaise/fatigue, diaphoresis, activity change, appetite change, fatigue and unexpected weight change.  HENT: Positive for neck pain. Negative for facial swelling and neck stiffness.   Eyes: Negative.  Negative for blurred vision.  Respiratory: Negative for cough, chest tightness, shortness of breath,  wheezing and stridor.   Cardiovascular: Negative for chest pain, palpitations, orthopnea, leg swelling and PND.  Gastrointestinal: Negative.   Genitourinary: Positive for polyuria.  Musculoskeletal: Negative for myalgias, back pain, joint swelling, arthralgias and gait problem.  Skin: Negative.   Neurological: Positive for numbness (and tingling in both hands and arms). Negative for dizziness, tremors, seizures, syncope, facial asymmetry, speech difficulty, weakness, light-headedness and headaches.  Hematological: Positive for polydipsia and polyphagia. Negative for adenopathy. Does not bruise/bleed easily.  Psychiatric/Behavioral: Negative.        Objective:   Physical Exam  Vitals reviewed. Constitutional: He is oriented to person, place, and time. He appears well-developed and well-nourished. No distress.  HENT:  Head: Normocephalic and atraumatic.  Mouth/Throat: Oropharynx is clear and moist. No oropharyngeal exudate.  Eyes: Conjunctivae are normal. Right eye exhibits no discharge. Left eye exhibits no discharge. No scleral icterus.  Neck: Normal range of motion. Neck supple. No JVD present. No tracheal deviation present. No thyromegaly present.  Cardiovascular: Normal rate, regular rhythm, normal heart sounds and intact distal pulses.  Exam reveals no gallop and no friction rub.   No murmur heard. Pulmonary/Chest: Effort normal and breath sounds normal. No stridor. No respiratory distress. He has no wheezes. He has no rales. He exhibits no tenderness.  Abdominal: Soft. Bowel sounds are normal. He exhibits no distension and no mass. There is no tenderness. There is no rebound and no guarding.  Musculoskeletal: Normal range of motion. He exhibits no edema and no tenderness.       Cervical back: Normal. He exhibits normal range of motion, no tenderness, no bony tenderness, no swelling, no edema, no deformity, no laceration, no pain, no spasm and normal pulse.  Lymphadenopathy:    He has  no cervical adenopathy.  Neurological: He is alert and oriented to person,  place, and time. He has normal strength. He displays no atrophy, no tremor and normal reflexes. No cranial nerve deficit or sensory deficit. He exhibits normal muscle tone. He displays a negative Romberg sign. He displays no seizure activity. Coordination and gait normal. He displays no Babinski's sign on the right side. He displays no Babinski's sign on the left side.  Reflex Scores:      Tricep reflexes are 1+ on the right side and 1+ on the left side.      Bicep reflexes are 1+ on the right side and 1+ on the left side.      Brachioradialis reflexes are 1+ on the right side and 1+ on the left side.      Patellar reflexes are 1+ on the right side and 1+ on the left side.      Achilles reflexes are 1+ on the right side and 1+ on the left side. Skin: Skin is warm and dry. No rash noted. He is not diaphoretic. No erythema. No pallor.  Psychiatric: He has a normal mood and affect. His behavior is normal. Judgment and thought content normal.      Lab Results  Component Value Date   WBC 7.7 04/05/2011   HGB 14.0 04/05/2011   HCT 40.4 04/05/2011   PLT 171.0 04/05/2011   GLUCOSE 119* 08/03/2011   CHOL 170 08/03/2011   TRIG 195.0* 08/03/2011   HDL 29.40* 08/03/2011   LDLDIRECT 99.7 04/05/2011   LDLCALC 102* 08/03/2011   ALT 33 08/03/2011   AST 20 08/03/2011   NA 138 08/03/2011   K 3.9 08/03/2011   CL 101 08/03/2011   CREATININE 1.1 08/03/2011   BUN 13 08/03/2011   CO2 28 08/03/2011   TSH 6.47* 08/03/2011   PSA 0.85 04/05/2011   HGBA1C 8.9* 08/03/2011      Assessment & Plan:

## 2011-12-06 ENCOUNTER — Encounter: Payer: Self-pay | Admitting: Internal Medicine

## 2011-12-06 LAB — COMPREHENSIVE METABOLIC PANEL
ALT: 32 U/L (ref 0–53)
AST: 20 U/L (ref 0–37)
Albumin: 4.2 g/dL (ref 3.5–5.2)
Alkaline Phosphatase: 70 U/L (ref 39–117)
BUN: 14 mg/dL (ref 6–23)
CO2: 24 mEq/L (ref 19–32)
Calcium: 9.3 mg/dL (ref 8.4–10.5)
Chloride: 99 mEq/L (ref 96–112)
Creatinine, Ser: 1.1 mg/dL (ref 0.4–1.5)
GFR: 73.94 mL/min (ref >60.00)
Glucose, Bld: 278 mg/dL — ABNORMAL HIGH (ref 70–99)
Potassium: 3.8 mEq/L (ref 3.5–5.1)
Sodium: 134 mEq/L — ABNORMAL LOW (ref 135–145)
Total Bilirubin: 0.9 mg/dL (ref 0.3–1.2)
Total Protein: 7 g/dL (ref 6.0–8.3)

## 2011-12-06 LAB — TESTOSTERONE, FREE, TOTAL, SHBG
Sex Hormone Binding: 22 nmol/L (ref 13–71)
Testosterone, Free: 136.3 pg/mL (ref 47.0–244.0)
Testosterone-% Free: 2.6 % (ref 1.6–2.9)
Testosterone: 523.47 ng/dL (ref 300–890)

## 2011-12-18 ENCOUNTER — Ambulatory Visit (INDEPENDENT_AMBULATORY_CARE_PROVIDER_SITE_OTHER): Payer: Managed Care, Other (non HMO) | Admitting: Internal Medicine

## 2011-12-18 ENCOUNTER — Encounter: Payer: Self-pay | Admitting: Internal Medicine

## 2011-12-18 VITALS — BP 136/84 | HR 58 | Temp 97.7°F | Resp 16 | Wt 249.0 lb

## 2011-12-18 DIAGNOSIS — I10 Essential (primary) hypertension: Secondary | ICD-10-CM

## 2011-12-18 DIAGNOSIS — E039 Hypothyroidism, unspecified: Secondary | ICD-10-CM

## 2011-12-18 DIAGNOSIS — IMO0001 Reserved for inherently not codable concepts without codable children: Secondary | ICD-10-CM

## 2011-12-18 DIAGNOSIS — E538 Deficiency of other specified B group vitamins: Secondary | ICD-10-CM

## 2011-12-18 MED ORDER — CYANOCOBALAMIN 250 MCG PO TABS: 250.0000 ug | ORAL_TABLET | Freq: Every day | ORAL | Status: DC

## 2011-12-18 MED ORDER — CANAGLIFLOZIN 300 MG PO TABS: 1.0000 | ORAL_TABLET | Freq: Every day | ORAL | Status: DC

## 2011-12-18 MED ORDER — SITAGLIPTIN PHOS-METFORMIN HCL 50-1000 MG PO TABS: 1.0000 | ORAL_TABLET | Freq: Two times a day (BID) | ORAL | Status: DC

## 2011-12-18 MED ORDER — OLMESARTAN MEDOXOMIL 40 MG PO TABS: 40.0000 mg | ORAL_TABLET | Freq: Every day | ORAL | Status: DC

## 2011-12-18 NOTE — Progress Notes (Signed)
Subjective:    Patient ID: Karl Morgan [place], male    DOB: May 22, 1960, 51 y.o.   MRN: [id number]  Diabetes He presents for his follow-up diabetic visit. He has type 2 diabetes mellitus. His disease course has been worsening. There are no hypoglycemic associated symptoms. Pertinent negatives for hypoglycemia include no dizziness. Associated symptoms include polydipsia, polyphagia and polyuria. Pertinent negatives for diabetes include no blurred vision, no chest pain, no fatigue, no foot paresthesias, no foot ulcerations, no visual change, no weakness and no weight loss. There are no hypoglycemic complications. Symptoms are worsening. There are no diabetic complications. When asked about current treatments, none were reported. He is compliant with treatment some of the time. His weight is stable. He is following a generally healthy diet. Meal planning includes avoidance of concentrated sweets. He participates in exercise intermittently. There is no change in his home blood glucose trend. His breakfast blood glucose range is generally >200 mg/dl. His lunch blood glucose range is generally >200 mg/dl. His dinner blood glucose range is generally >200 mg/dl. His highest blood glucose is >200 mg/dl. His overall blood glucose range is >200 mg/dl. An ACE inhibitor/angiotensin II receptor blocker is being taken. He does not see a podiatrist.Eye exam is current.      Review of Systems  Constitutional: Negative.  Negative for weight loss and fatigue.  HENT: Negative.   Eyes: Negative.  Negative for blurred vision.  Respiratory: Negative.   Cardiovascular: Negative.  Negative for chest pain.  Gastrointestinal: Negative.   Genitourinary: Positive for polyuria.  Musculoskeletal: Negative.   Skin: Negative.   Neurological: Negative.  Negative for dizziness and weakness.  Hematological: Positive for polydipsia and polyphagia.  Psychiatric/Behavioral: Negative.        Objective:   Physical Exam  Vitals  reviewed. Constitutional: He is oriented to person, place, and time. He appears well-developed and well-nourished. No distress.  HENT:  Head: Normocephalic and atraumatic.  Mouth/Throat: Oropharynx is clear and moist. No oropharyngeal exudate.  Eyes: Conjunctivae normal are normal. Right eye exhibits no discharge. Left eye exhibits no discharge. No scleral icterus.  Neck: Normal range of motion. Neck supple. No JVD present. No tracheal deviation present. No thyromegaly present.  Cardiovascular: Normal rate, regular rhythm, normal heart sounds and intact distal pulses.  Exam reveals no gallop and no friction rub.   No murmur heard. Pulmonary/Chest: Effort normal and breath sounds normal. No stridor. No respiratory distress. He has no wheezes. He has no rales. He exhibits no tenderness.  Abdominal: Soft. Bowel sounds are normal. He exhibits no distension and no mass. There is no tenderness. There is no rebound and no guarding.  Musculoskeletal: Normal range of motion. He exhibits no edema and no tenderness.  Lymphadenopathy:    He has no cervical adenopathy.  Neurological: He is oriented to person, place, and time.  Skin: Skin is warm and dry. No rash noted. He is not diaphoretic. No erythema. No pallor.  Psychiatric: He has a normal mood and affect. His behavior is normal. Judgment and thought content normal.     Lab Results  Component Value Date   WBC 7.9 12/05/2011   HGB 16.5 12/05/2011   HCT 49.3 12/05/2011   PLT 176.0 12/05/2011   GLUCOSE 278* 12/05/2011   CHOL 170 08/03/2011   TRIG 195.0* 08/03/2011   HDL 29.40* 08/03/2011   LDLDIRECT 99.7 04/05/2011   LDLCALC 102* 08/03/2011   ALT 32 12/05/2011   AST 20 12/05/2011   NA 134 Repeated and verified X2.*  12/05/2011   K 3.8 12/05/2011   CL 99 12/05/2011   CREATININE 1.1 12/05/2011   BUN 14 12/05/2011   CO2 24 12/05/2011   TSH 5.43 12/05/2011   PSA 0.85 04/05/2011   HGBA1C 9.9* 12/05/2011       Assessment & Plan:

## 2011-12-18 NOTE — Assessment & Plan Note (Signed)
Recent TSH was ok ?

## 2011-12-18 NOTE — Patient Instructions (Signed)

## 2011-12-18 NOTE — Assessment & Plan Note (Signed)
I have asked him to start a new regimen for blood sugar control

## 2011-12-18 NOTE — Assessment & Plan Note (Signed)
His BP is well controlled 

## 2011-12-18 NOTE — Assessment & Plan Note (Signed)
Start oral B12 replacement therapy 

## 2011-12-19 ENCOUNTER — Ambulatory Visit (INDEPENDENT_AMBULATORY_CARE_PROVIDER_SITE_OTHER): Payer: Managed Care, Other (non HMO)

## 2011-12-19 DIAGNOSIS — E291 Testicular hypofunction: Secondary | ICD-10-CM

## 2011-12-19 MED ORDER — TESTOSTERONE CYPIONATE 200 MG/ML IM SOLN
200.0000 mg | INTRAMUSCULAR | Status: DC
  Administered 2011-12-19: 200 mg via INTRAMUSCULAR

## 2011-12-28 ENCOUNTER — Telehealth: Payer: Self-pay

## 2011-12-28 NOTE — Telephone Encounter (Signed)
Received fax from pharmacy stating that insurance will not cover Benicar w/o a prior auth. Need to call [phone]  to proceed with request (ID#   [id number]   ). PA form request received from [facility], pending completion.

## 2012-01-01 NOTE — Telephone Encounter (Signed)
Received PA form from insurance company, pending completeion

## 2012-01-02 ENCOUNTER — Ambulatory Visit (INDEPENDENT_AMBULATORY_CARE_PROVIDER_SITE_OTHER): Payer: Managed Care, Other (non HMO)

## 2012-01-02 DIAGNOSIS — E291 Testicular hypofunction: Secondary | ICD-10-CM

## 2012-01-02 MED ORDER — TESTOSTERONE CYPIONATE 200 MG/ML IM SOLN
200.0000 mg | Freq: Once | INTRAMUSCULAR | Status: AC
  Administered 2012-01-02: 200 mg via INTRAMUSCULAR

## 2012-01-04 ENCOUNTER — Ambulatory Visit: Payer: Managed Care, Other (non HMO) | Admitting: Internal Medicine

## 2012-01-07 NOTE — Telephone Encounter (Signed)
Pa approved 12/05/11-01/03/13, pharmacy notiifed

## 2012-01-16 ENCOUNTER — Other Ambulatory Visit: Payer: Self-pay | Admitting: Internal Medicine

## 2012-01-22 ENCOUNTER — Ambulatory Visit (INDEPENDENT_AMBULATORY_CARE_PROVIDER_SITE_OTHER): Payer: Managed Care, Other (non HMO)

## 2012-01-22 DIAGNOSIS — E291 Testicular hypofunction: Secondary | ICD-10-CM

## 2012-01-22 MED ORDER — TESTOSTERONE CYPIONATE 200 MG/ML IM SOLN
200.0000 mg | Freq: Once | INTRAMUSCULAR | Status: AC
  Administered 2012-01-22: 200 mg via INTRAMUSCULAR

## 2012-02-08 ENCOUNTER — Ambulatory Visit: Payer: Managed Care, Other (non HMO)

## 2012-02-08 DIAGNOSIS — E291 Testicular hypofunction: Secondary | ICD-10-CM

## 2012-02-18 MED ORDER — TESTOSTERONE CYPIONATE 200 MG/ML IM SOLN: 400.0000 mg | Freq: Once | INTRAMUSCULAR | Status: DC

## 2012-02-20 ENCOUNTER — Ambulatory Visit (INDEPENDENT_AMBULATORY_CARE_PROVIDER_SITE_OTHER): Payer: Managed Care, Other (non HMO) | Admitting: *Deleted

## 2012-02-20 DIAGNOSIS — E291 Testicular hypofunction: Secondary | ICD-10-CM

## 2012-02-20 MED ORDER — TESTOSTERONE CYPIONATE 200 MG/ML IM SOLN
200.0000 mg | Freq: Once | INTRAMUSCULAR | Status: AC
  Administered 2012-02-20: 200 mg via INTRAMUSCULAR

## 2012-02-21 ENCOUNTER — Ambulatory Visit: Payer: Managed Care, Other (non HMO)

## 2012-03-05 ENCOUNTER — Ambulatory Visit (INDEPENDENT_AMBULATORY_CARE_PROVIDER_SITE_OTHER): Payer: Managed Care, Other (non HMO)

## 2012-03-05 DIAGNOSIS — E291 Testicular hypofunction: Secondary | ICD-10-CM

## 2012-03-05 MED ORDER — TESTOSTERONE CYPIONATE 200 MG/ML IM SOLN
200.0000 mg | Freq: Once | INTRAMUSCULAR | Status: AC
  Administered 2012-03-05: 200 mg via INTRAMUSCULAR

## 2012-03-06 ENCOUNTER — Ambulatory Visit: Payer: Managed Care, Other (non HMO)

## 2012-03-12 ENCOUNTER — Other Ambulatory Visit: Payer: Self-pay

## 2012-03-12 DIAGNOSIS — IMO0001 Reserved for inherently not codable concepts without codable children: Secondary | ICD-10-CM

## 2012-03-12 MED ORDER — SITAGLIPTIN PHOS-METFORMIN HCL 50-1000 MG PO TABS
1.0000 | ORAL_TABLET | Freq: Two times a day (BID) | ORAL | Status: DC
Start: 2012-03-12 — End: 2012-08-11

## 2012-03-18 ENCOUNTER — Ambulatory Visit (INDEPENDENT_AMBULATORY_CARE_PROVIDER_SITE_OTHER): Payer: Managed Care, Other (non HMO) | Admitting: Internal Medicine

## 2012-03-18 ENCOUNTER — Encounter: Payer: Self-pay | Admitting: Internal Medicine

## 2012-03-18 ENCOUNTER — Other Ambulatory Visit (INDEPENDENT_AMBULATORY_CARE_PROVIDER_SITE_OTHER): Payer: Managed Care, Other (non HMO)

## 2012-03-18 VITALS — BP 124/88 | HR 76 | Temp 97.6°F | Resp 16 | Wt 248.0 lb

## 2012-03-18 DIAGNOSIS — I10 Essential (primary) hypertension: Secondary | ICD-10-CM

## 2012-03-18 DIAGNOSIS — E039 Hypothyroidism, unspecified: Secondary | ICD-10-CM

## 2012-03-18 DIAGNOSIS — E785 Hyperlipidemia, unspecified: Secondary | ICD-10-CM

## 2012-03-18 DIAGNOSIS — E291 Testicular hypofunction: Secondary | ICD-10-CM

## 2012-03-18 DIAGNOSIS — IMO0001 Reserved for inherently not codable concepts without codable children: Secondary | ICD-10-CM

## 2012-03-18 LAB — COMPREHENSIVE METABOLIC PANEL
ALT: 27 U/L (ref 0–53)
AST: 21 U/L (ref 0–37)
Albumin: 4.4 g/dL (ref 3.5–5.2)
Alkaline Phosphatase: 62 U/L (ref 39–117)
BUN: 17 mg/dL (ref 6–23)
CO2: 28 mEq/L (ref 19–32)
Calcium: 9.4 mg/dL (ref 8.4–10.5)
Chloride: 102 mEq/L (ref 96–112)
Creatinine, Ser: 1.2 mg/dL (ref 0.4–1.5)
GFR: 65.61 mL/min (ref >60.00)
Glucose, Bld: 118 mg/dL — ABNORMAL HIGH (ref 70–99)
Potassium: 4.2 mEq/L (ref 3.5–5.1)
Sodium: 138 mEq/L (ref 135–145)
Total Bilirubin: 0.8 mg/dL (ref 0.3–1.2)
Total Protein: 7.5 g/dL (ref 6.0–8.3)

## 2012-03-18 LAB — LIPID PANEL
Cholesterol: 195 mg/dL (ref 0–200)
HDL: 25.4 mg/dL — ABNORMAL LOW (ref >39.00)
Total CHOL/HDL Ratio: 8
Triglycerides: 204 mg/dL — ABNORMAL HIGH (ref 0.0–149.0)
VLDL: 40.8 mg/dL — ABNORMAL HIGH (ref 0.0–40.0)

## 2012-03-18 LAB — TSH: TSH: 3.72 u[IU]/mL (ref 0.35–5.50)

## 2012-03-18 LAB — HEMOGLOBIN A1C: Hgb A1c MFr Bld: 8 % — ABNORMAL HIGH (ref 4.6–6.5)

## 2012-03-18 LAB — LDL CHOLESTEROL, DIRECT: Direct LDL: 157.3 mg/dL

## 2012-03-18 LAB — HM DIABETES FOOT EXAM: HM Diabetic Foot Exam: NORMAL

## 2012-03-18 MED ORDER — TESTOSTERONE CYPIONATE 200 MG/ML IM SOLN
200.0000 mg | Freq: Once | INTRAMUSCULAR | Status: AC
  Administered 2012-03-18: 200 mg via INTRAMUSCULAR

## 2012-03-18 NOTE — Progress Notes (Signed)
Subjective:    Patient ID: Karl Morgan, male    DOB: [birth year], 51 y.o.   MRN: [id number]  Diabetes He presents for his follow-up diabetic visit. He has type 2 diabetes mellitus. There are no hypoglycemic associated symptoms. Pertinent negatives for hypoglycemia include no pallor. Pertinent negatives for diabetes include no blurred vision, no chest pain, no fatigue, no foot paresthesias, no foot ulcerations, no polydipsia, no polyphagia, no polyuria, no visual change, no weakness and no weight loss. There are no hypoglycemic complications. Symptoms are stable. There are no diabetic complications. Current diabetic treatment includes oral agent (dual therapy) (he never started invokana). He is compliant with treatment most of the time. His weight is stable. He is following a generally healthy diet. Meal planning includes avoidance of concentrated sweets. He participates in exercise intermittently. There is no change in his home blood glucose trend. His breakfast blood glucose range is generally 130-140 mg/dl. His lunch blood glucose range is generally 140-180 mg/dl. His dinner blood glucose range is generally 180-200 mg/dl. His highest blood glucose is 180-200 mg/dl. His overall blood glucose range is 140-180 mg/dl. An ACE inhibitor/angiotensin II receptor blocker is being taken. He does not see a podiatrist.Eye exam is current.      Review of Systems  Constitutional: Negative for fever, chills, weight loss, diaphoresis, activity change, appetite change, fatigue and unexpected weight change.  HENT: Negative.   Eyes: Negative.  Negative for blurred vision.  Respiratory: Negative for cough, chest tightness, shortness of breath, wheezing and stridor.   Cardiovascular: Negative for chest pain, palpitations and leg swelling.  Gastrointestinal: Negative for nausea, vomiting, abdominal pain, diarrhea, constipation and anal bleeding.  Genitourinary: Negative.  Negative for polyuria.  Musculoskeletal:  Negative for myalgias, back pain, joint swelling, arthralgias and gait problem.  Skin: Negative for color change, pallor, rash and wound.  Neurological: Negative.  Negative for weakness.  Hematological: Negative for polydipsia, polyphagia and adenopathy. Does not bruise/bleed easily.  Psychiatric/Behavioral: Negative.        Objective:   Physical Exam  Vitals reviewed. Constitutional: He is oriented to person, place, and time. He appears well-developed and well-nourished. No distress.  HENT:  Head: Normocephalic and atraumatic.  Mouth/Throat: Oropharynx is clear and moist. No oropharyngeal exudate.  Eyes: Conjunctivae normal are normal. Right eye exhibits no discharge. Left eye exhibits no discharge. No scleral icterus.  Neck: Normal range of motion. Neck supple. No JVD present. No tracheal deviation present. No thyromegaly present.  Cardiovascular: Normal rate, regular rhythm, normal heart sounds and intact distal pulses.  Exam reveals no gallop and no friction rub.   No murmur heard. Pulmonary/Chest: Effort normal and breath sounds normal. No stridor. No respiratory distress. He has no wheezes. He has no rales. He exhibits no tenderness.  Abdominal: Soft. Bowel sounds are normal. He exhibits no distension and no mass. There is no tenderness. There is no rebound and no guarding.  Musculoskeletal: Normal range of motion. He exhibits no edema and no tenderness.  Lymphadenopathy:    He has no cervical adenopathy.  Neurological: He is oriented to person, place, and time.  Skin: Skin is warm and dry. No rash noted. He is not diaphoretic. No erythema. No pallor.  Psychiatric: He has a normal mood and affect. His behavior is normal. Judgment and thought content normal.     Lab Results  Component Value Date   WBC 7.9 12/05/2011   HGB 16.5 12/05/2011   HCT 49.3 12/05/2011   PLT 176.0 12/05/2011  GLUCOSE 278* 12/05/2011   CHOL 170 08/03/2011   TRIG 195.0* 08/03/2011   HDL 29.40* 08/03/2011    LDLDIRECT 99.7 04/05/2011   LDLCALC 102* 08/03/2011   ALT 32 12/05/2011   AST 20 12/05/2011   NA 134 Repeated and verified X2.* 12/05/2011   K 3.8 12/05/2011   CL 99 12/05/2011   CREATININE 1.1 12/05/2011   BUN 14 12/05/2011   CO2 24 12/05/2011   TSH 5.43 12/05/2011   PSA 0.85 04/05/2011   HGBA1C 9.9* 12/05/2011       Assessment & Plan:

## 2012-03-18 NOTE — Assessment & Plan Note (Signed)
His BP is well controlled Today I will check his lytes and renal function 

## 2012-03-18 NOTE — Patient Instructions (Signed)

## 2012-03-18 NOTE — Assessment & Plan Note (Signed)
I will recheck his TSH today and will treat if needed

## 2012-03-18 NOTE — Assessment & Plan Note (Signed)
I will repeat his FLP today

## 2012-03-18 NOTE — Assessment & Plan Note (Signed)
I will recheck his a1c today and will also look at his c-peptide He has not been willing to add invokana I will monitor his renal function today

## 2012-03-19 ENCOUNTER — Encounter: Payer: Self-pay | Admitting: Internal Medicine

## 2012-03-19 LAB — C-PEPTIDE: C-Peptide: 2.7 ng/mL (ref 0.80–3.90)

## 2012-04-03 ENCOUNTER — Ambulatory Visit: Payer: Managed Care, Other (non HMO)

## 2012-04-04 ENCOUNTER — Ambulatory Visit (INDEPENDENT_AMBULATORY_CARE_PROVIDER_SITE_OTHER): Payer: Managed Care, Other (non HMO) | Admitting: *Deleted

## 2012-04-04 DIAGNOSIS — E291 Testicular hypofunction: Secondary | ICD-10-CM

## 2012-04-04 MED ORDER — TESTOSTERONE CYPIONATE 200 MG/ML IM SOLN
200.0000 mg | Freq: Once | INTRAMUSCULAR | Status: AC
  Administered 2012-04-04: 200 mg via INTRAMUSCULAR

## 2012-04-17 ENCOUNTER — Ambulatory Visit: Payer: Managed Care, Other (non HMO)

## 2012-04-18 ENCOUNTER — Ambulatory Visit (INDEPENDENT_AMBULATORY_CARE_PROVIDER_SITE_OTHER): Payer: Managed Care, Other (non HMO)

## 2012-04-18 DIAGNOSIS — E291 Testicular hypofunction: Secondary | ICD-10-CM

## 2012-04-18 MED ORDER — TESTOSTERONE CYPIONATE 200 MG/ML IM SOLN
200.0000 mg | INTRAMUSCULAR | Status: DC
  Administered 2012-04-18 – 2012-07-29 (×6): 200 mg via INTRAMUSCULAR

## 2012-05-01 ENCOUNTER — Ambulatory Visit: Payer: Managed Care, Other (non HMO)

## 2012-05-02 ENCOUNTER — Ambulatory Visit (INDEPENDENT_AMBULATORY_CARE_PROVIDER_SITE_OTHER): Payer: Managed Care, Other (non HMO)

## 2012-05-02 DIAGNOSIS — E291 Testicular hypofunction: Secondary | ICD-10-CM

## 2012-05-02 MED ORDER — TESTOSTERONE CYPIONATE 200 MG/ML IM SOLN: 200.0000 mg | INTRAMUSCULAR | Status: AC

## 2012-06-02 ENCOUNTER — Ambulatory Visit (INDEPENDENT_AMBULATORY_CARE_PROVIDER_SITE_OTHER): Payer: Managed Care, Other (non HMO)

## 2012-06-02 DIAGNOSIS — E291 Testicular hypofunction: Secondary | ICD-10-CM

## 2012-06-02 MED ORDER — TESTOSTERONE CYPIONATE 200 MG/ML IM SOLN
200.0000 mg | Freq: Once | INTRAMUSCULAR | Status: AC
  Administered 2012-06-02: 200 mg via INTRAMUSCULAR

## 2012-06-17 ENCOUNTER — Ambulatory Visit (INDEPENDENT_AMBULATORY_CARE_PROVIDER_SITE_OTHER): Payer: Managed Care, Other (non HMO)

## 2012-06-17 DIAGNOSIS — E291 Testicular hypofunction: Secondary | ICD-10-CM

## 2012-07-01 ENCOUNTER — Ambulatory Visit (INDEPENDENT_AMBULATORY_CARE_PROVIDER_SITE_OTHER): Payer: Managed Care, Other (non HMO) | Admitting: Internal Medicine

## 2012-07-01 DIAGNOSIS — E291 Testicular hypofunction: Secondary | ICD-10-CM

## 2012-07-15 ENCOUNTER — Ambulatory Visit: Payer: Managed Care, Other (non HMO)

## 2012-07-15 DIAGNOSIS — E291 Testicular hypofunction: Secondary | ICD-10-CM

## 2012-07-17 ENCOUNTER — Ambulatory Visit: Payer: Managed Care, Other (non HMO) | Admitting: Internal Medicine

## 2012-07-29 ENCOUNTER — Ambulatory Visit (INDEPENDENT_AMBULATORY_CARE_PROVIDER_SITE_OTHER): Payer: Managed Care, Other (non HMO)

## 2012-07-29 DIAGNOSIS — E291 Testicular hypofunction: Secondary | ICD-10-CM

## 2012-08-11 ENCOUNTER — Ambulatory Visit (INDEPENDENT_AMBULATORY_CARE_PROVIDER_SITE_OTHER): Payer: Managed Care, Other (non HMO) | Admitting: Family Medicine

## 2012-08-11 ENCOUNTER — Encounter: Payer: Self-pay | Admitting: Family Medicine

## 2012-08-11 VITALS — BP 167/102 | HR 77 | Temp 97.0°F | Resp 16 | Ht 73.0 in | Wt 246.0 lb

## 2012-08-11 DIAGNOSIS — IMO0001 Reserved for inherently not codable concepts without codable children: Secondary | ICD-10-CM

## 2012-08-11 DIAGNOSIS — I10 Essential (primary) hypertension: Secondary | ICD-10-CM

## 2012-08-11 LAB — BASIC METABOLIC PANEL
BUN: 16 mg/dL (ref 6–23)
CO2: 22 mEq/L (ref 19–32)
Calcium: 9.2 mg/dL (ref 8.4–10.5)
Chloride: 102 mEq/L (ref 96–112)
Creat: 1.12 mg/dL (ref 0.50–1.35)
Glucose, Bld: 353 mg/dL — ABNORMAL HIGH (ref 70–99)
Potassium: 4.2 mEq/L (ref 3.5–5.3)
Sodium: 133 mEq/L — ABNORMAL LOW (ref 135–145)

## 2012-08-11 LAB — POCT GLYCOSYLATED HEMOGLOBIN (HGB A1C): Hemoglobin A1C: 9.1

## 2012-08-11 LAB — GLUCOSE, POCT (MANUAL RESULT ENTRY): POC Glucose: 355 mg/dl — AB (ref 70–99)

## 2012-08-11 MED ORDER — GLIMEPIRIDE 2 MG PO TABS: 2.0000 mg | ORAL_TABLET | Freq: Every day | ORAL | Status: DC

## 2012-08-11 MED ORDER — LISINOPRIL 20 MG PO TABS: 20.0000 mg | ORAL_TABLET | Freq: Every day | ORAL | Status: DC

## 2012-08-11 MED ORDER — METFORMIN HCL 1000 MG PO TABS: 1000.0000 mg | ORAL_TABLET | Freq: Two times a day (BID) | ORAL | Status: DC

## 2012-08-11 NOTE — Patient Instructions (Signed)
Keep a record of your blood pressures outside of the office and bring them to the next office visit. Start the metformin and amaryl as discussed, recheck with levels in next 2 weeks.

## 2012-08-11 NOTE — Progress Notes (Signed)
Subjective:    Patient ID: Karl Morgan, male    DOB: 08-01-1960, 52 y.o.   MRN: [id number]  HPI Karl Morgan is a 52 y.o. male New patient to me, seen at [facility] for acute issues in past. Hx of HTN, Dm2, hypothyroidism, hypogonadism, hyperlipdemia.  Last appt with primary provider 03/18/2012 - Sanda Linger. Planning on changing doctors.  Could not come to agreement on meds - Janumet and Benicar not covered by insurance. Prior on Victoza - unknown dose, and metformin and felt like those were working better.  Also had taken glimepiride in past. No trouble with these 2 meds.   DM2 - off all meds for past 6 weeks. Did try discount card, and worked first time, but $300 next time.  Symptomatic lows on victoza, but not on glimepiride. home blood sugars in 250-300 range. Last Hemoglobin Aic 8.0 in December on Janumet. 50/1000mg  BID. No abd pain, but has been thirsty past 6 weeks of meds.    Hypogonadism - takes testosterone injections. Still going to other office for injections every 2 weeks.   HTN - off Benicar for past 6 weeks due to cost. No chest pains, weakness, headache. No known allergies to Ace inhibitors and took lisinopril in past without difficulty.   Recovered alcoholic - sober for past 13 years.  No recent AA involvement, no desire to drink.   Hx of hyperlipidemia - but myalgias with pravastatin.    Review of Systems  Constitutional: Negative for fatigue and unexpected weight change.  Eyes: Negative for visual disturbance.  Respiratory: Negative for cough, chest tightness and shortness of breath.   Cardiovascular: Negative for chest pain, palpitations and leg swelling.  Gastrointestinal: Negative for abdominal pain and blood in stool.  Neurological: Negative for dizziness, speech difficulty, light-headedness and headaches.      Objective:   Physical Exam  Vitals reviewed. Constitutional: He is oriented to person, place, and time. He appears well-developed and well-nourished.   Obese.  HENT:  Head: Normocephalic and atraumatic.  Eyes: Pupils are equal, round, and reactive to light.  Cardiovascular: Normal rate, regular rhythm, normal heart sounds and intact distal pulses.   Pulmonary/Chest: Effort normal and breath sounds normal.  Abdominal: Soft. There is no tenderness.  Neurological: He is alert and oriented to person, place, and time.  Microfilament testing of feet normal bilaterally.  Skin: Skin is warm, dry and intact. No rash noted.  Psychiatric: He has a normal mood and affect. His behavior is normal.      Results for orders placed in visit on 08/11/12  GLUCOSE, POCT (MANUAL RESULT ENTRY)      Result Value Range   POC Glucose 355 (*) 70 - 99 mg/dl  POCT GLYCOSYLATED HEMOGLOBIN (HGB A1C)      Result Value Range   Hemoglobin A1C 9.1         Assessment & Plan:  Karl Morgan is a 52 y.o. male Type II or unspecified type diabetes mellitus without mention of complication, uncontrolled - Plan: POCT glucose (manual entry), POCT glycosylated hemoglobin (Hb A1C), Basic metabolic panel, metFORMIN (GLUCOPHAGE) 1000 MG tablet, glimepiride (AMARYL) 2 MG tablet  Unspecified essential hypertension - Plan: Basic metabolic panel, lisinopril (PRINIVIL,ZESTRIL) 20 MG tablet  HTN - uncontrolled with med nonadherence due to cost. No know hx of ACE allergy and tolerated lisinopril prior, so will try that again. Will change to lisinopril 20mg  QD, check home BP's and recheck in 2 weeks. Likely will need higher dose as prior  at 40mg  Benicar. Orthostatic precautions discussed.   DM2- uncontrolled, med nonadherence due to cost. Start metformin 1000mg  BID, amaryl 2mg  po qd in place of prior regimen.  Hypoglycemic precautions reviewed. Recheck in next 2 weeks.  Discussed that we can reschedule physical, and address concerns on patient health survey at future ov as primary role today was to restart treatment for HTN, DM2.   Patient Instructions  Keep a record of your  blood pressures outside of the office and bring them to the next office visit. Start the metformin and amaryl as discussed, recheck with levels in next 2 weeks.

## 2012-08-12 ENCOUNTER — Ambulatory Visit: Payer: Managed Care, Other (non HMO)

## 2012-08-18 ENCOUNTER — Ambulatory Visit (INDEPENDENT_AMBULATORY_CARE_PROVIDER_SITE_OTHER): Payer: Managed Care, Other (non HMO) | Admitting: *Deleted

## 2012-08-18 DIAGNOSIS — E291 Testicular hypofunction: Secondary | ICD-10-CM

## 2012-08-18 MED ORDER — TESTOSTERONE CYPIONATE 200 MG/ML IM SOLN
200.0000 mg | Freq: Once | INTRAMUSCULAR | Status: AC
  Administered 2012-08-18: 200 mg via INTRAMUSCULAR

## 2012-09-01 ENCOUNTER — Ambulatory Visit (INDEPENDENT_AMBULATORY_CARE_PROVIDER_SITE_OTHER): Payer: Managed Care, Other (non HMO) | Admitting: Family Medicine

## 2012-09-01 ENCOUNTER — Encounter: Payer: Self-pay | Admitting: Family Medicine

## 2012-09-01 VITALS — BP 132/84 | HR 74 | Temp 98.1°F | Resp 16 | Ht 72.0 in | Wt 245.4 lb

## 2012-09-01 DIAGNOSIS — R7989 Other specified abnormal findings of blood chemistry: Secondary | ICD-10-CM

## 2012-09-01 DIAGNOSIS — IMO0001 Reserved for inherently not codable concepts without codable children: Secondary | ICD-10-CM

## 2012-09-01 DIAGNOSIS — E291 Testicular hypofunction: Secondary | ICD-10-CM

## 2012-09-01 DIAGNOSIS — I10 Essential (primary) hypertension: Secondary | ICD-10-CM

## 2012-09-01 DIAGNOSIS — N529 Male erectile dysfunction, unspecified: Secondary | ICD-10-CM

## 2012-09-01 MED ORDER — LISINOPRIL 20 MG PO TABS: 20.0000 mg | ORAL_TABLET | Freq: Every day | ORAL | Status: DC

## 2012-09-01 MED ORDER — GLIMEPIRIDE 4 MG PO TABS: 4.0000 mg | ORAL_TABLET | Freq: Every day | ORAL | Status: DC

## 2012-09-01 MED ORDER — METFORMIN HCL 1000 MG PO TABS: 1000.0000 mg | ORAL_TABLET | Freq: Two times a day (BID) | ORAL | Status: DC

## 2012-09-01 NOTE — Progress Notes (Signed)
Subjective:    Patient ID: Karl Morgan, male    DOB: [birth year], 52 y.o.   MRN: [id number]  HPI Karl Morgan is a 52 y.o. male  See last ov May 12th - new pt to me at that time. Plans on rescheduling physical.   DM2- started on Metformin 1000mg  BID, amaryl 2mg  qd last ov. Home blood sugars - home blood sugars around 10, 240 after meals, lowest 98 (didnt eat all day and working outside).  No symptomatic lows. Average 160-170's. Taking amaryl in am. No more 300's.   Results for orders placed in visit on 08/11/12  BASIC METABOLIC PANEL      Result Value Range   Sodium 133 (*) 135 - 145 mEq/L   Potassium 4.2  3.5 - 5.3 mEq/L   Chloride 102  96 - 112 mEq/L   CO2 22  19 - 32 mEq/L   Glucose, Bld 353 (*) 70 - 99 mg/dL   BUN 16  6 - 23 mg/dL   Creat 4.09  8.11 - 9.14 mg/dL   Calcium 9.2  8.4 - 78.2 mg/dL  GLUCOSE, POCT (MANUAL RESULT ENTRY)      Result Value Range   POC Glucose 355 (*) 70 - 99 mg/dl  POCT GLYCOSYLATED HEMOGLOBIN (HGB A1C)      Result Value Range   Hemoglobin A1C 9.1      HTN - uncontrolled with med nonadherence due to cost (benicar) last ov.  lisinopril 20mg  QD started. home BP's.  No new side effects of meds.   Hypogonadism - takes testosterone injections. Still going to other office for injections every 2 weeks. On injections for past 3 years. No recent urology eval. Feels better taking this med, takes viagra, but just works ok. Would be willing to discuss.   Hx of hyperlipidemia - but myalgias with pravastatin.    Review of Systems  Constitutional: Negative for fatigue and unexpected weight change.  Eyes: Negative for visual disturbance.  Respiratory: Negative for cough, chest tightness and shortness of breath.   Cardiovascular: Negative for chest pain, palpitations and leg swelling.  Gastrointestinal: Negative for abdominal pain and blood in stool.  Neurological: Negative for dizziness, light-headedness and headaches.       Objective:   Physical Exam   Vitals reviewed. Constitutional: He is oriented to person, place, and time. He appears well-developed and well-nourished.  Overweight/obese.   HENT:  Head: Normocephalic and atraumatic.  Eyes: Pupils are equal, round, and reactive to light.  Cardiovascular: Normal rate, regular rhythm, normal heart sounds and intact distal pulses.   Pulmonary/Chest: Effort normal and breath sounds normal.  Abdominal: Soft. There is no tenderness.  Neurological: He is alert and oriented to person, place, and time.  Microfilament testing of feet normal bilaterally.  Skin: Skin is warm, dry and intact. No rash noted.  Psychiatric: He has a normal mood and affect. His behavior is normal.      Assessment & Plan:  Karl Morgan is a 52 y.o. male Type II or unspecified type diabetes mellitus without mention of complication, uncontrolled prior off meds.  improving control on home readings, but still elevated.   No true lows, but discussed not to skip meals while taking these meds. , but  Plan: metFORMIN (GLUCOPHAGE) 1000 MG tablet BID - continue, and increase glimepiride (AMARYL) to 4 MG tablet with first large meal. Plan on A1c in 2 months.   Unspecified essential hypertension - Plan: lisinopril (PRINIVIL,ZESTRIL) 20 MG tablet.  Stable.  continue same dose.   Low testosterone - Plan: Ambulatory referral to Urology to discuss treatment, given underlying diabetes and possible cardiovascular risk. He would like to transition to different practice than prior provider for his injections additionally.  Additionally plans on discussing erectile dysfunction options with urology.  Meds ordered this encounter  Medications  . metFORMIN (GLUCOPHAGE) 1000 MG tablet    Sig: Take 1 tablet (1,000 mg total) by mouth 2 (two) times daily with a meal.    Dispense:  180 tablet    Refill:  0  . lisinopril (PRINIVIL,ZESTRIL) 20 MG tablet    Sig: Take 1 tablet (20 mg total) by mouth daily.    Dispense:  90 tablet    Refill:  0   . glimepiride (AMARYL) 4 MG tablet    Sig: Take 1 tablet (4 mg total) by mouth daily before breakfast.    Dispense:  90 tablet    Refill:  1   Patient Instructions  Increase amaryl to 4mg  each day with first main meal.  Continue same doses of other meds. Recheck in 2 months for repeat hemoglobin A1c. Keep a record of your blood pressures/blood sugars outside of the office and bring them to the next office visit. Return to the clinic or go to the nearest emergency room if any of your symptoms worsen or new symptoms occur. I will also refer you to a urologist to discuss the testosterone replacement and your other symptoms.

## 2012-09-01 NOTE — Patient Instructions (Signed)
Increase amaryl to 4mg  each day with first main meal.  Continue same doses of other meds. Recheck in 2 months for repeat hemoglobin A1c. Keep a record of your blood pressures/blood sugars outside of the office and bring them to the next office visit. Return to the clinic or go to the nearest emergency room if any of your symptoms worsen or new symptoms occur. I will also refer you to a urologist to discuss the testosterone replacement and your other symptoms.

## 2012-11-03 ENCOUNTER — Telehealth: Payer: Self-pay | Admitting: *Deleted

## 2012-11-03 ENCOUNTER — Encounter: Payer: Managed Care, Other (non HMO) | Admitting: Family Medicine

## 2012-11-03 NOTE — Telephone Encounter (Signed)
Called patient's mobile number not in service, then I left message at the home phone voice-mail to come in 3:30 p m because we have a slot available. Per Dr Neva Seat.

## 2012-11-03 NOTE — Progress Notes (Signed)
Subjective:    Patient ID: Karl Morgan, male    DOB: [birth year], 52 y.o.   MRN: [id number]  HPI Karl Morgan is a 52 y.o. male DM2 -  Hgb A1c off meds 9.1 on 08/11/12.  intially started on metformin 1000 MG BID, amaryl 2mg  QD - increased glimepiride (AMARYL) to 4 MG tablet at last ov d/t elevated readings.  Home blood sugars: Dentist: Optho: Hx of myalgias/intolerance to statin - pravastatin.   Unspecified essential hypertension - on lisinopril 20mg  qd. Outside Bp's:   Results for orders placed in visit on 08/11/12  BASIC METABOLIC PANEL      Result Value Range   Sodium 133 (*) 135 - 145 mEq/L   Potassium 4.2  3.5 - 5.3 mEq/L   Chloride 102  96 - 112 mEq/L   CO2 22  19 - 32 mEq/L   Glucose, Bld 353 (*) 70 - 99 mg/dL   BUN 16  6 - 23 mg/dL   Creat 4.09  8.11 - 9.14 mg/dL   Calcium 9.2  8.4 - 78.2 mg/dL  GLUCOSE, POCT (MANUAL RESULT ENTRY)      Result Value Range   POC Glucose 355 (*) 70 - 99 mg/dl  POCT GLYCOSYLATED HEMOGLOBIN (HGB A1C)      Result Value Range   Hemoglobin A1C 9.1      Low testosterone - referred to urology for mgt options. Prior on testosterone injections at last practice.  Past Medical History  Diagnosis Date  . Type II or unspecified type diabetes mellitus without mention of complication, uncontrolled 04/04/2011  . Unspecified essential hypertension 04/04/2011  . Hyperlipidemia 04/04/2011  . Hypogonadism male 04/04/2011  . Alopecia 04/04/2011  . Erectile dysfunction 04/04/2011  . Diabetes mellitus   . Substance abuse   . Heart murmur    No past surgical history on file. Allergies  Allergen Reactions  . Pravachol (Pravastatin Sodium)     Muscle aches   Prior to Admission medications   Medication Sig Start Date End Date Taking? Authorizing Provider  Blood Glucose Monitoring Suppl (CONTOUR NEXT EZ MONITOR) W/DEVICE KIT 1 Act by Does not apply route 2 (two) times daily. 12/05/11   Etta Grandchild, MD  glimepiride (AMARYL) 4 MG tablet Take 1 tablet (4 mg  total) by mouth daily before breakfast. 09/01/12   Shade Flood, MD  glucose blood [facility] CONTOUR NEXT TEST) test strip Use as instructed 12/05/11 12/04/12  Etta Grandchild, MD  lisinopril (PRINIVIL,ZESTRIL) 20 MG tablet Take 1 tablet (20 mg total) by mouth daily. 09/01/12   Shade Flood, MD  metFORMIN (GLUCOPHAGE) 1000 MG tablet Take 1 tablet (1,000 mg total) by mouth 2 (two) times daily with a meal. 09/01/12   Shade Flood, MD  sildenafil (VIAGRA) 100 MG tablet Take 1 tablet (100 mg total) by mouth daily as needed for erectile dysfunction. 12/05/11 08/11/12  Etta Grandchild, MD  vitamin B-12 (CYANOCOBALAMIN) 250 MCG tablet Take 1 tablet (250 mcg total) by mouth daily. 12/18/11   Etta Grandchild, MD   History   Social History  . Marital Status: Married    Spouse Name: N/A    Number of Children: N/A  . Years of Education: N/A   Occupational History  . OPERATIONS MANAGER of maintenance [employer]   Social History Main Topics  . Smoking status: Never Smoker   . Smokeless tobacco: Never Used  . Alcohol Use: No  . Drug Use: No  . Sexually Active:  Yes   Other Topics Concern  . Not on file   Social History Narrative   Caffienated drinks-no   Seat belt use often-yes   Regular Exercise-no   Smoke alarm in the home-yes   Firearms/guns in the home-yes   History of physical abuse-no                 Review of Systems     Objective:   Physical Exam        Assessment & Plan:   This encounter was created in error - please disregard.

## 2012-11-04 ENCOUNTER — Telehealth: Payer: Self-pay

## 2012-11-04 NOTE — Telephone Encounter (Signed)
Patient wanted to call and apologize to Dr. Neva Seat for missing his appointment with him on Monday.  He had been out of town and when he got home last night he heard the message calling to remind him of his appointment.  He has rescheduled for 11-10-12.

## 2012-11-10 ENCOUNTER — Ambulatory Visit (INDEPENDENT_AMBULATORY_CARE_PROVIDER_SITE_OTHER): Payer: 59 | Admitting: Family Medicine

## 2012-11-10 ENCOUNTER — Encounter: Payer: Self-pay | Admitting: Family Medicine

## 2012-11-10 VITALS — BP 138/72 | HR 86 | Temp 98.6°F | Resp 16 | Ht 72.0 in | Wt 256.8 lb

## 2012-11-10 DIAGNOSIS — N529 Male erectile dysfunction, unspecified: Secondary | ICD-10-CM

## 2012-11-10 DIAGNOSIS — I10 Essential (primary) hypertension: Secondary | ICD-10-CM

## 2012-11-10 DIAGNOSIS — E119 Type 2 diabetes mellitus without complications: Secondary | ICD-10-CM

## 2012-11-10 DIAGNOSIS — IMO0001 Reserved for inherently not codable concepts without codable children: Secondary | ICD-10-CM

## 2012-11-10 LAB — POCT GLYCOSYLATED HEMOGLOBIN (HGB A1C): Hemoglobin A1C: 7.1

## 2012-11-10 MED ORDER — SILDENAFIL CITRATE 100 MG PO TABS: 50.0000 mg | ORAL_TABLET | Freq: Every day | ORAL | Status: DC | PRN

## 2012-11-10 MED ORDER — LISINOPRIL 20 MG PO TABS: 20.0000 mg | ORAL_TABLET | Freq: Every day | ORAL | Status: DC

## 2012-11-10 MED ORDER — METFORMIN HCL 1000 MG PO TABS: 1000.0000 mg | ORAL_TABLET | Freq: Two times a day (BID) | ORAL | Status: DC

## 2012-11-10 NOTE — Progress Notes (Signed)
Subjective:    Patient ID: Stephfon F [place], male    DOB: 1960-11-24, 52 y.o.   MRN: [id number]  HPI Clearance F [place] is a 52 y.o. male  DM2 - last ov 09/01/12. Intially off meds on May office visit, restarted meds, then in follow up home readings still elevated.  Increased amaryl to 4mg  Qam, continue d metformin 1gram BID. Occasional queasy if not eating enough food in the morning - jittery, but lowest reading 94, no true hypoglycemia. After meals around 160, usually around 110-120.  Missed few doses of meds on vacation few weeks ago, otherwise taking meds well.  HTN - outside BP 130/80's.    Low testosterone - followed by Dr. Retta Diones. Has testosterone self injections once per week.     Review of Systems  Constitutional: Negative for fatigue and unexpected weight change.  Eyes: Negative for visual disturbance.  Respiratory: Negative for cough, chest tightness and shortness of breath.   Cardiovascular: Negative for chest pain, palpitations and leg swelling.  Gastrointestinal: Negative for abdominal pain and blood in stool.  Neurological: Negative for dizziness, light-headedness and headaches.       Objective:   Physical Exam  Vitals reviewed. Constitutional: He is oriented to person, place, and time. He appears well-developed and well-nourished. No distress.  Overweight.  HENT:  Head: Normocephalic and atraumatic.  Eyes: Pupils are equal, round, and reactive to light.  Cardiovascular: Normal rate, regular rhythm, normal heart sounds and intact distal pulses.   Pulmonary/Chest: Effort normal and breath sounds normal.  Abdominal: Soft. There is no tenderness.  Neurological: He is alert and oriented to person, place, and time.  Skin: Skin is warm, dry and intact. No rash noted.  Psychiatric: He has a normal mood and affect. His behavior is normal.    Results for orders placed in visit on 11/10/12  POCT GLYCOSYLATED HEMOGLOBIN (HGB A1C)      Result Value Range   Hemoglobin A1C 7.1         Assessment & Plan:  Nyron F [place] is a 52 y.o. male Type II or unspecified type diabetes mellitus without mention of complication, not stated as uncontrolled - Plan: - improved - near goal - scontineu same doses of meds, encouraged wt loss - goal of 8-10 pounds by next ov.   Unspecified essential hypertension - Plan: lisinopril (PRINIVIL,ZESTRIL) 20 MG tablet.  Stable. Cont current meds.   Erectile dysfunction - Plan: sildenafil (VIAGRA) 100 MG tablet refilled - try 1/2 dose if possible, but also advised not to exceed 100mg  dosage.   Continue follow up with urology for testosterone.   Meds ordered this encounter  Medications  . sildenafil (VIAGRA) 100 MG tablet    Sig: Take 0.5-1 tablets (50-100 mg total) by mouth daily as needed for erectile dysfunction.    Dispense:  5 tablet    Refill:  11  . metFORMIN (GLUCOPHAGE) 1000 MG tablet    Sig: Take 1 tablet (1,000 mg total) by mouth 2 (two) times daily with a meal.    Dispense:  180 tablet    Refill:  0  . lisinopril (PRINIVIL,ZESTRIL) 20 MG tablet    Sig: Take 1 tablet (20 mg total) by mouth daily.    Dispense:  90 tablet    Refill:  0   Patient Instructions  Continue same doses of meds, recheck in 3 months. Work on diet and goal of 8-10 pound weight loss in 3 months. Return to the clinic or go to the nearest  emergency room if any of your symptoms worsen or new symptoms occur.

## 2012-11-10 NOTE — Patient Instructions (Signed)
Continue same doses of meds, recheck in 3 months. Work on diet and goal of 8-10 pound weight loss in 3 months. Return to the clinic or go to the nearest emergency room if any of your symptoms worsen or new symptoms occur.

## 2012-11-11 ENCOUNTER — Encounter: Payer: Self-pay | Admitting: Radiology

## 2012-11-11 DIAGNOSIS — E291 Testicular hypofunction: Secondary | ICD-10-CM

## 2012-11-30 ENCOUNTER — Other Ambulatory Visit: Payer: Self-pay | Admitting: Family Medicine

## 2013-01-11 ENCOUNTER — Other Ambulatory Visit: Payer: Self-pay | Admitting: Internal Medicine

## 2013-04-03 ENCOUNTER — Other Ambulatory Visit: Payer: Self-pay | Admitting: Family Medicine

## 2013-04-20 ENCOUNTER — Encounter: Payer: Self-pay | Admitting: Family Medicine

## 2013-04-20 ENCOUNTER — Ambulatory Visit (INDEPENDENT_AMBULATORY_CARE_PROVIDER_SITE_OTHER): Payer: 59 | Admitting: Family Medicine

## 2013-04-20 VITALS — BP 124/88 | HR 74 | Temp 98.5°F | Resp 18 | Ht 72.0 in | Wt 255.6 lb

## 2013-04-20 DIAGNOSIS — E1165 Type 2 diabetes mellitus with hyperglycemia: Principal | ICD-10-CM

## 2013-04-20 DIAGNOSIS — E119 Type 2 diabetes mellitus without complications: Secondary | ICD-10-CM

## 2013-04-20 DIAGNOSIS — IMO0001 Reserved for inherently not codable concepts without codable children: Secondary | ICD-10-CM

## 2013-04-20 DIAGNOSIS — I10 Essential (primary) hypertension: Secondary | ICD-10-CM

## 2013-04-20 LAB — POCT GLYCOSYLATED HEMOGLOBIN (HGB A1C): Hemoglobin A1C: 7.6

## 2013-04-20 LAB — GLUCOSE, POCT (MANUAL RESULT ENTRY): POC Glucose: 167 mg/dl — AB (ref 70–99)

## 2013-04-20 MED ORDER — GLIMEPIRIDE 4 MG PO TABS: ORAL_TABLET | ORAL | Status: DC

## 2013-04-20 MED ORDER — LISINOPRIL 20 MG PO TABS: ORAL_TABLET | ORAL | Status: DC

## 2013-04-20 MED ORDER — METFORMIN HCL 1000 MG PO TABS: 1000.0000 mg | ORAL_TABLET | Freq: Two times a day (BID) | ORAL | Status: DC

## 2013-04-20 NOTE — Patient Instructions (Signed)
Work on improved diet as discussed. Stay on same diabetes medications for now. You should receive a call or letter about your lab results within the next week to 10 days.  Recheck in the next three months - fasting for bloodwork that visit, or have blood drawn fasting morning of visit.  Return to the clinic or go to the nearest emergency room if any of your symptoms worsen or new symptoms occur.

## 2013-04-20 NOTE — Progress Notes (Signed)
Subjective:    Patient ID: Karl Morgan, male    DOB: 26-Dec-1960, 53 y.o.   MRN: [id number]  This chart was scribed for Wendie Agreste, MD by Maree Erie, ED Scribe. The patient was seen in room 23. Patient's care was started at 4:08 PM.  Chief Complaint  Patient presents with  . Medication Refill  . Diabetes   PCP: Scarlette Calico, MD   HPI  Karl Morgan is a 53 y.o. male who presents to office for follow up. Last office visit 11/10/12.  Diabetes: Last A1C 7.1, weight 256 lb. Goal of 8-10 lb weight loss by next office visit. Weight 255 today. Takes Lisinopril for hypertension and diabetes. Last creatinine 1.12, 07/2012. He states that he is checking his blood sugar at home sporadically. The morning readings have been around 160-170. The blood sugar has been up to the 280's in the afternoon. He believes there may be a chance his glucometer is inaccurate. He states he had decreased diet control over the holidays, but he is back in control this month. He has not been exercising. He checked it once and had a low reading of 78 a few months ago. He has had possible low symptoms but has been able to eat something with complete relief. He reports increased anxiety recently due to changes in work environment and stressful environment at home with his house foundation issues. He believes the situational anxiety should subside soon.   ED/Hypogonadism: Followed by Dr. Diona Fanti last testosterone 439, PSA 1.18, Hemoglobin 15.9 on 04/14/13.    Patient Active Problem List   Diagnosis Date Noted  . B12 deficiency 12/18/2011  . Unspecified hypothyroidism 05/04/2011  . Routine general medical examination at a health care facility 04/05/2011  . Type II or unspecified type diabetes mellitus without mention of complication, uncontrolled 04/04/2011  . Unspecified essential hypertension 04/04/2011  . Hyperlipidemia 04/04/2011  . Hypogonadism male 04/04/2011  . Alopecia 04/04/2011  . Erectile  dysfunction 04/04/2011   Past Medical History  Diagnosis Date  . Type II or unspecified type diabetes mellitus without mention of complication, uncontrolled 04/04/2011  . Unspecified essential hypertension 04/04/2011  . Hyperlipidemia 04/04/2011  . Hypogonadism male 04/04/2011  . Alopecia 04/04/2011  . Erectile dysfunction 04/04/2011  . Diabetes mellitus   . Substance abuse   . Heart murmur    No past surgical history on file. Allergies  Allergen Reactions  . Pravachol [Pravastatin Sodium]     Muscle aches   Prior to Admission medications   Medication Sig Start Date End Date Taking? Authorizing Provider  Blood Glucose Monitoring Suppl (CONTOUR NEXT EZ MONITOR) W/DEVICE KIT 1 Act by Does not apply route 2 (two) times daily. 12/05/11  Yes Janith Lima, MD  glimepiride (AMARYL) 4 MG tablet TAKE 1 TABLET (4 MG TOTAL) BY MOUTH DAILY BEFORE BREAKFAST. 11/30/12  Yes Wendie Agreste, MD  lisinopril (PRINIVIL,ZESTRIL) 20 MG tablet Take 1 tablet (20 mg total) by mouth daily. 11/10/12  Yes Wendie Agreste, MD  lisinopril (PRINIVIL,ZESTRIL) 20 MG tablet TAKE 1 TABLET (20 MG TOTAL) BY MOUTH DAILY. 11/30/12  Yes Wendie Agreste, MD  metFORMIN (GLUCOPHAGE) 1000 MG tablet Take 1 tablet (1,000 mg total) by mouth 2 (two) times daily with a meal. 11/10/12  Yes Wendie Agreste, MD  metFORMIN (GLUCOPHAGE) 1000 MG tablet Take 1 tablet (1,000 mg total) by mouth 2 (two) times daily with a meal. PATIENT NEEDS OFFICE VISIT FOR ADDITIONAL REFILLS 04/03/13  Yes Ranell Patrick  Carlota Raspberry, MD  sildenafil (VIAGRA) 100 MG tablet Take 0.5-1 tablets (50-100 mg total) by mouth daily as needed for erectile dysfunction. 11/10/12  Yes Wendie Agreste, MD  vitamin B-12 (CYANOCOBALAMIN) 250 MCG tablet TAKE 1 TABLET (250 MCG TOTAL) BY MOUTH DAILY. 01/11/13  Yes Janith Lima, MD   History   Social History  . Marital Status: Married    Spouse Name: N/A    Number of Children: N/A  . Years of Education: N/A   Occupational History  .  OPERATIONS MANAGER of maintenance [employer]   Social History Main Topics  . Smoking status: Never Smoker   . Smokeless tobacco: Never Used  . Alcohol Use: No  . Drug Use: No  . Sexual Activity: Yes   Other Topics Concern  . Not on file   Social History Narrative   Caffienated drinks-no   Seat belt use often-yes   Regular Exercise-no   Smoke alarm in the home-yes   Firearms/guns in the home-yes   History of physical abuse-no                   Review of Systems  Constitutional: Negative for fever, chills, diaphoresis, appetite change and fatigue.  HENT: Negative for mouth sores, sore throat and trouble swallowing.   Eyes: Negative for visual disturbance.  Respiratory: Negative for cough, chest tightness, shortness of breath and wheezing.   Cardiovascular: Negative for chest pain.  Gastrointestinal: Negative for nausea, vomiting, abdominal pain, diarrhea and abdominal distention.  Endocrine: Negative for polydipsia, polyphagia and polyuria.  Genitourinary: Negative for dysuria, frequency and hematuria.  Musculoskeletal: Negative for gait problem.  Skin: Negative for color change, pallor and rash.  Neurological: Negative for dizziness, syncope, light-headedness and headaches.  Hematological: Does not bruise/bleed easily.  Psychiatric/Behavioral: Negative for behavioral problems and confusion. The patient is nervous/anxious (with recent stressors only. ).        Objective:   Physical Exam  Vitals reviewed. Constitutional: He is oriented to person, place, and time. He appears well-developed and well-nourished.  HENT:  Head: Normocephalic and atraumatic.  Eyes: EOM are normal. Pupils are equal, round, and reactive to light.  Neck: No JVD present. Carotid bruit is not present.  Cardiovascular: Normal rate, regular rhythm and normal heart sounds.   No murmur heard. Pulmonary/Chest: Effort normal and breath sounds normal. He has no rales.  Musculoskeletal: He exhibits  no edema.  Neurological: He is alert and oriented to person, place, and time.  Skin: Skin is warm and dry.  Psychiatric: He has a normal mood and affect.     Filed Vitals:   04/20/13 1528  BP: 124/88  Pulse: 74  Temp: 98.5 F (36.9 C)  TempSrc: Oral  Resp: 18  Height: 6' (1.829 m)  Weight: 255 lb 9.6 oz (115.939 kg)  SpO2: 99%   Lab Results  Component Value Date   HGBA1C 7.6 04/20/2013      Assessment & Plan:   Stephanie F [place] Morgan is a 53 y.o. male Type II or unspecified type diabetes mellitus without mention of complication, uncontrolled - Plan: POCT glucose (manual entry), POCT glycosylated hemoglobin (Hb A1C), glimepiride (AMARYL) 4 MG tablet, metFORMIN (GLUCOPHAGE) 1000 MG tablet - less controlled, but no changes at present as he plans on improved diet control and activity - limited some recently with work/life stressors - if these do not improve, advised to come talk to me about these if needed. Continue metformin and Amaryl at current doses.   HTN (  hypertension) - Plan: lisinopril (PRINIVIL,ZESTRIL) 20 MG tablet, Basic metabolic panel - controlled. Cont same doses, recheck BMP. Repeat ov in 3 months.    Meds ordered this encounter  Medications  . lisinopril (PRINIVIL,ZESTRIL) 20 MG tablet    Sig: TAKE 1 TABLET (20 MG TOTAL) BY MOUTH DAILY.    Dispense:  90 tablet    Refill:  1  . glimepiride (AMARYL) 4 MG tablet    Sig: TAKE 1 TABLET (4 MG TOTAL) BY MOUTH DAILY BEFORE BREAKFAST.    Dispense:  90 tablet    Refill:  1  . metFORMIN (GLUCOPHAGE) 1000 MG tablet    Sig: Take 1 tablet (1,000 mg total) by mouth 2 (two) times daily with a meal.    Dispense:  180 tablet    Refill:  1   Patient Instructions  Work on improved diet as discussed. Stay on same diabetes medications for now. You should receive a call or letter about your lab results within the next week to 10 days.  Recheck in the next three months - fasting for bloodwork that visit, or have blood drawn fasting morning  of visit.  Return to the clinic or go to the nearest emergency room if any of your symptoms worsen or new symptoms occur.   I personally performed the services described in this documentation, which was scribed in my presence. The recorded information has been reviewed and considered, and addended by me as needed.

## 2013-04-21 LAB — BASIC METABOLIC PANEL
BUN: 14 mg/dL (ref 6–23)
CO2: 25 mEq/L (ref 19–32)
Calcium: 9.1 mg/dL (ref 8.4–10.5)
Chloride: 101 mEq/L (ref 96–112)
Creat: 1.53 mg/dL — ABNORMAL HIGH (ref 0.50–1.35)
Glucose, Bld: 164 mg/dL — ABNORMAL HIGH (ref 70–99)
Potassium: 4.1 mEq/L (ref 3.5–5.3)
Sodium: 138 mEq/L (ref 135–145)

## 2013-04-28 ENCOUNTER — Other Ambulatory Visit: Payer: Self-pay | Admitting: Family Medicine

## 2013-04-28 DIAGNOSIS — R7989 Other specified abnormal findings of blood chemistry: Secondary | ICD-10-CM

## 2013-05-07 ENCOUNTER — Other Ambulatory Visit: Payer: Self-pay | Admitting: Family Medicine

## 2013-06-23 ENCOUNTER — Inpatient Hospital Stay: Payer: Self-pay | Admitting: Internal Medicine

## 2013-06-23 LAB — CK TOTAL AND CKMB (NOT AT ARMC)
CK, Total: 553 U/L — ABNORMAL HIGH
CK, Total: 1574 U/L — ABNORMAL HIGH
CK, Total: 1156 U/L — ABNORMAL HIGH
CK-MB: 12 ng/mL — ABNORMAL HIGH (ref 0.5–3.6)
CK-MB: 42.8 ng/mL — ABNORMAL HIGH (ref 0.5–3.6)
CK-MB: 42 ng/mL — ABNORMAL HIGH (ref 0.5–3.6)

## 2013-06-23 LAB — CBC WITH DIFFERENTIAL/PLATELET
Basophil #: 0.1 10*3/uL (ref 0.0–0.1)
Basophil %: 0.6 %
Eosinophil #: 0.1 10*3/uL (ref 0.0–0.7)
Eosinophil %: 1.3 %
HCT: 49.4 % (ref 40.0–52.0)
HGB: 16.8 g/dL (ref 13.0–18.0)
Lymphocyte #: 1.8 10*3/uL (ref 1.0–3.6)
Lymphocyte %: 15.7 %
MCH: 31.5 pg (ref 26.0–34.0)
MCHC: 34 g/dL (ref 32.0–36.0)
MCV: 93 fL (ref 80–100)
Monocyte #: 0.7 x10 3/mm (ref 0.2–1.0)
Monocyte %: 6.3 %
Neutrophil #: 8.8 10*3/uL — ABNORMAL HIGH (ref 1.4–6.5)
Neutrophil %: 76.1 %
Platelet: 181 10*3/uL (ref 150–440)
RBC: 5.34 10*6/uL (ref 4.40–5.90)
RDW: 13 % (ref 11.5–14.5)
WBC: 11.5 10*3/uL — ABNORMAL HIGH (ref 3.8–10.6)

## 2013-06-23 LAB — TROPONIN I
Troponin-I: >40 ng/mL
Troponin-I: >40 ng/mL
Troponin-I: >40 ng/mL

## 2013-06-23 LAB — COMPREHENSIVE METABOLIC PANEL
Albumin: 4.3 g/dL (ref 3.4–5.0)
Alkaline Phosphatase: 97 U/L
Anion Gap: 5 — ABNORMAL LOW (ref 7–16)
BUN: 14 mg/dL (ref 7–18)
Bilirubin,Total: 0.8 mg/dL (ref 0.2–1.0)
Calcium, Total: 9.1 mg/dL (ref 8.5–10.1)
Chloride: 101 mmol/L (ref 98–107)
Co2: 28 mmol/L (ref 21–32)
Creatinine: 1.18 mg/dL (ref 0.60–1.30)
EGFR (African American): >60
EGFR (Non-African Amer.): >60
Glucose: 258 mg/dL — ABNORMAL HIGH (ref 65–99)
Osmolality: 278 (ref 275–301)
Potassium: 4.1 mmol/L (ref 3.5–5.1)
SGOT(AST): 101 U/L — ABNORMAL HIGH (ref 15–37)
SGPT (ALT): 53 U/L (ref 12–78)
Sodium: 134 mmol/L — ABNORMAL LOW (ref 136–145)
Total Protein: 7.6 g/dL (ref 6.4–8.2)

## 2013-06-23 LAB — URINALYSIS, COMPLETE
Bacteria: NONE SEEN
Glucose,UR: >=500 mg/dL (ref 0–75)
Ketone: NEGATIVE
Leukocyte Esterase: NEGATIVE
Nitrite: NEGATIVE
Ph: 6 (ref 4.5–8.0)
Protein: NEGATIVE
RBC,UR: <1 /HPF (ref 0–5)
Specific Gravity: 1.023 (ref 1.003–1.030)
Squamous Epithelial: NONE SEEN
WBC UR: <1 /HPF (ref 0–5)

## 2013-06-23 LAB — LIPID PANEL
Cholesterol: 182 mg/dL (ref 0–200)
HDL Cholesterol: 21 mg/dL — ABNORMAL LOW (ref 40–60)
Ldl Cholesterol, Calc: 109 mg/dL — ABNORMAL HIGH (ref 0–100)
Triglycerides: 261 mg/dL — ABNORMAL HIGH (ref 0–200)
VLDL Cholesterol, Calc: 52 mg/dL — ABNORMAL HIGH (ref 5–40)

## 2013-06-23 LAB — HEMOGLOBIN A1C: Hemoglobin A1C: 7.6 % — ABNORMAL HIGH (ref 4.2–6.3)

## 2013-06-24 LAB — CK TOTAL AND CKMB (NOT AT ARMC)
CK, Total: 444 U/L — ABNORMAL HIGH
CK-MB: 12 ng/mL — ABNORMAL HIGH (ref 0.5–3.6)

## 2013-06-24 LAB — CBC WITH DIFFERENTIAL/PLATELET
Basophil #: 0 10*3/uL (ref 0.0–0.1)
Basophil %: 0.3 %
Eosinophil #: 0.1 10*3/uL (ref 0.0–0.7)
Eosinophil %: 0.8 %
HCT: 40.5 % (ref 40.0–52.0)
HGB: 14 g/dL (ref 13.0–18.0)
Lymphocyte #: 1.7 10*3/uL (ref 1.0–3.6)
Lymphocyte %: 14 %
MCH: 32 pg (ref 26.0–34.0)
MCHC: 34.7 g/dL (ref 32.0–36.0)
MCV: 92 fL (ref 80–100)
Monocyte #: 1 x10 3/mm (ref 0.2–1.0)
Monocyte %: 8.5 %
Neutrophil #: 9 10*3/uL — ABNORMAL HIGH (ref 1.4–6.5)
Neutrophil %: 76.4 %
Platelet: 159 10*3/uL (ref 150–440)
RBC: 4.39 10*6/uL — ABNORMAL LOW (ref 4.40–5.90)
RDW: 12.8 % (ref 11.5–14.5)
WBC: 11.8 10*3/uL — ABNORMAL HIGH (ref 3.8–10.6)

## 2013-06-24 LAB — BASIC METABOLIC PANEL
Anion Gap: 6 — ABNORMAL LOW (ref 7–16)
BUN: 13 mg/dL (ref 7–18)
Calcium, Total: 8.1 mg/dL — ABNORMAL LOW (ref 8.5–10.1)
Chloride: 103 mmol/L (ref 98–107)
Co2: 28 mmol/L (ref 21–32)
Creatinine: 1.23 mg/dL (ref 0.60–1.30)
EGFR (African American): >60
EGFR (Non-African Amer.): >60
Glucose: 152 mg/dL — ABNORMAL HIGH (ref 65–99)
Osmolality: 277 (ref 275–301)
Potassium: 3.5 mmol/L (ref 3.5–5.1)
Sodium: 137 mmol/L (ref 136–145)

## 2013-06-25 LAB — BASIC METABOLIC PANEL
Anion Gap: 3 — ABNORMAL LOW (ref 7–16)
BUN: 17 mg/dL (ref 7–18)
Calcium, Total: 8.3 mg/dL — ABNORMAL LOW (ref 8.5–10.1)
Chloride: 103 mmol/L (ref 98–107)
Co2: 27 mmol/L (ref 21–32)
Creatinine: 1.22 mg/dL (ref 0.60–1.30)
EGFR (African American): >60
EGFR (Non-African Amer.): >60
Glucose: 142 mg/dL — ABNORMAL HIGH (ref 65–99)
Osmolality: 270 (ref 275–301)
Potassium: 3.6 mmol/L (ref 3.5–5.1)
Sodium: 133 mmol/L — ABNORMAL LOW (ref 136–145)

## 2013-07-20 ENCOUNTER — Ambulatory Visit: Payer: 59 | Admitting: Family Medicine

## 2013-09-22 ENCOUNTER — Other Ambulatory Visit: Payer: Self-pay | Admitting: Family Medicine

## 2013-09-22 LAB — LIPID PANEL
Cholesterol: 109 mg/dL (ref 0–200)
HDL Cholesterol: 25 mg/dL — ABNORMAL LOW (ref 40–60)
Ldl Cholesterol, Calc: 64 mg/dL (ref 0–100)
Triglycerides: 99 mg/dL (ref 0–200)
VLDL Cholesterol, Calc: 20 mg/dL (ref 5–40)

## 2013-09-22 LAB — HEMOGLOBIN A1C: Hemoglobin A1C: 7.4 % — ABNORMAL HIGH (ref 4.2–6.3)

## 2014-05-03 DIAGNOSIS — E291 Testicular hypofunction: Secondary | ICD-10-CM

## 2014-07-24 NOTE — Consult Note (Signed)
Chief Complaint:  Subjective/Chief Complaint Pt state sto fel great . He is ready to go home, Denies cp or sob   VITAL SIGNS/ANCILLARY NOTES: **Vital Signs.:   26-Mar-15 06:10  Vital Signs Type Routine  Temperature Temperature (F) 97.8  Celsius 36.5  Temperature Source oral  Pulse Pulse 63  Systolic BP Systolic BP 456  Diastolic BP (mmHg) Diastolic BP (mmHg) 69  Mean BP 81  Pulse Ox % Pulse Ox % 95  Pulse Ox Activity Level  At rest  Oxygen Delivery Room Air/ 21 %  *Intake and Output.:   Daily 26-Mar-15 07:00  Grand Totals Intake:  1082 Output:  1350    Net:  -52 24 Hr.:  -268  Oral Intake      In:  832  IV (Primary)      In:  250  Urine ml     Out:  1350  Length of Stay Totals Intake:  4512 Output:  3750    Net:  762   Brief Assessment:  GEN well developed, well nourished, no acute distress   Cardiac Regular  murmur present   Respiratory normal resp effort  clear BS   Gastrointestinal Normal   Gastrointestinal details normal Soft   EXTR negative cyanosis/clubbing, negative edema   Lab Results:  Hepatic:  24-Mar-15 09:20   Bilirubin, Total 0.8  Alkaline Phosphatase 97 (45-117 NOTE: New Reference Range 02/20/13)  SGPT (ALT) 53  SGOT (AST)  101  Total Protein, Serum 7.6  Albumin, Serum 4.3  Cardiology:  24-Mar-15 08:57   Ventricular Rate 86  Atrial Rate 86  P-R Interval 156  QRS Duration 82  QT 342  QTc 409  P Axis 41  R Axis 19  T Axis 9  ECG interpretation Normal sinus rhythm Low voltage QRS ST elevation consider lateral injury or acute infarct Statement Not Found (#821) Abnormal ECG No previous ECGs available ----------unconfirmed---------- Confirmed by OVERREAD, NOT ([id number]), editor PEARSON, BARBARA ([id number]) on 06/24/2013 9:59:52 AM    09:34   Ventricular Rate 86  Atrial Rate 86  P-R Interval 164  QRS Duration 84  QT 346  QTc 414  P Axis 49  R Axis 22  T Axis 9  ECG interpretation Normal sinus rhythm ST elevation consider lateral injury  or acute infarct *** ** ** ** * ACUTE MI  ** ** ** ** Abnormal ECG No previous ECGs available ----------unconfirmed---------- Confirmed by OVERREAD, NOT ([id number]), editor PEARSON, BARBARA ([id number]) on 06/24/2013 10:00:09 AM  Routine Chem:  24-Mar-15 09:20   Glucose, Serum  258  BUN 14  Creatinine (comp) 1.18  Sodium, Serum  134  Potassium, Serum 4.1  Chloride, Serum 101  CO2, Serum 28  Calcium (Total), Serum 9.1  Anion Gap  5  Osmolality (calc) 278  eGFR (African American) >60  eGFR (Non-African American) >60 (eGFR values <20m/min/1.73 m2 may be an indication of chronic kidney disease (CKD). Calculated eGFR is useful in patients with stable renal function. The eGFR calculation will not be reliable in acutely ill patients when serum creatinine is changing rapidly. It is not useful in  patients on dialysis. The eGFR calculation may not be applicable to patients at the low and high extremes of body sizes, pregnant women, and vegetarians.)  Result Comment troponin - REPEATED AND CONFIRMED BY DILUTION  - c ann thompson,rn 06/23/2013 '@1057' /srb  - READ-BACK PROCESS PERFORMED.  Result(s) reported on 23 Jun 2013 at 10:04AM.  Hemoglobin A1c ([facility]  7.6 (The American Diabetes Association recommends that  a primary goal of therapy should be <7% and that physicians should reevaluate the treatment regimen in patients with HbA1c values consistently >8%.)    13:04   Result Comment troponin - REPEATED AND CONFIRMED BY DILUTION  - previously called 06/23/2013 @ 1057  - by srb..srb  Result(s) reported on 23 Jun 2013 at 02:29PM.  Cholesterol, Serum 182  Triglycerides, Serum  261  HDL (INHOUSE)  21  VLDL Cholesterol Calculated  52  LDL Cholesterol Calculated  109 (Result(s) reported on 23 Jun 2013 at 01:49PM.)    17:13   Result Comment TROPONIN - RESULTS VERIFIED BY REPEAT TESTING.  - PREVIOUSLY CALLED/1057;06-23-13/BY [facility]B.  Result(s) reported on 23 Jun 2013 at 06:40PM.  Cardiac:  24-Mar-15 09:20    CK, Total  553 (39-308 NOTE: NEW REFERENCE RANGE  05/04/2013)  CPK-MB, Serum  12.0 (Result(s) reported on 23 Jun 2013 at 12:50PM.)  Troponin I  > 40.00 (0.00-0.05 0.05 ng/mL or less: NEGATIVE  Repeat testing in 3-6 hrs  if clinically indicated. >0.05 ng/mL: POTENTIAL  MYOCARDIAL INJURY. Repeat  testing in 3-6 hrs if  clinically indicated. NOTE: An increase or decrease  of 30% or more on serial  testing suggests a  clinically important change)    13:04   CK, Total  1574 (39-308 NOTE: NEW REFERENCE RANGE  05/04/2013)  CPK-MB, Serum  42.8 (Result(s) reported on 23 Jun 2013 at 02:29PM.)  Troponin I  > 40.00 (0.00-0.05 0.05 ng/mL or less: NEGATIVE  Repeat testing in 3-6 hrs  if clinically indicated. >0.05 ng/mL: POTENTIAL  MYOCARDIAL INJURY. Repeat  testing in 3-6 hrs if  clinically indicated. NOTE: An increase or decrease  of 30% or more on serial  testing suggests a  clinically important change)    17:13   CK, Total  1156 (39-308 NOTE: NEW REFERENCE RANGE  05/04/2013)  CPK-MB, Serum  42.0 (Result(s) reported on 23 Jun 2013 at 06:49PM.)  Troponin I  > 40.00 (0.00-0.05 0.05 ng/mL or less: NEGATIVE  Repeat testing in 3-6 hrs  if clinically indicated. >0.05 ng/mL: POTENTIAL  MYOCARDIAL INJURY. Repeat  testing in 3-6 hrs if  clinically indicated. NOTE: An increase or decrease  of 30% or more on serial  testing suggests a  clinically important change)  Routine UA:  24-Mar-15 19:42   Color (UA) Straw  Clarity (UA) Clear  Glucose (UA) >=500  Ketones (UA) Negative  Specific Gravity (UA) 1.023  pH (UA) 6.0  Protein (UA) Negative  Nitrite (UA) Negative  Leukocyte Esterase (UA) Negative (Result(s) reported on 23 Jun 2013 at 08:32PM.)  RBC (UA) <1 /HPF  WBC (UA) <1 /HPF  Bacteria (UA) NONE SEEN  Epithelial Cells (UA) NONE SEEN  Result(s) reported on 23 Jun 2013 at 08:32PM.  Routine Hem:  24-Mar-15 09:20   WBC (CBC)  11.5  RBC (CBC) 5.34  Hemoglobin (CBC) 16.8   Hematocrit (CBC) 49.4  Platelet Count (CBC) 181  MCV 93  MCH 31.5  MCHC 34.0  RDW 13.0  Neutrophil % 76.1  Lymphocyte % 15.7  Monocyte % 6.3  Eosinophil % 1.3  Basophil % 0.6  Neutrophil #  8.8  Lymphocyte # 1.8  Monocyte # 0.7  Eosinophil # 0.1  Basophil # 0.1 (Result(s) reported on 23 Jun 2013 at 09:42AM.)   Radiology Results: XRay:    24-Mar-15 09:15, Chest Portable Single View  Chest Portable Single View   REASON FOR EXAM:    cp  COMMENTS:       PROCEDURE: DXR -  DXR PORTABLE CHEST SINGLE VIEW  - Jun 23 2013  9:15AM     CLINICAL DATA:  Chest pain    EXAM:  PORTABLE CHEST - 1 VIEW    COMPARISON:  None.    FINDINGS:  Cardiomediastinal silhouette is unremarkable. No acute infiltrate or  pleural effusion. No pulmonary edema. Bony thorax is unremarkable.   IMPRESSION:  No active disease.      Electronically Signed    By: Lahoma Crocker M.D.    On: 06/23/2013 09:21         Verified By: Ephraim Hamburger, M.D.,  Cardiology:    24-Mar-15 08:57, ED ECG  Ventricular Rate 86  Atrial Rate 86  P-R Interval 156  QRS Duration 82  QTc 409  P Axis 41  R Axis 19  T Axis 9  ECG interpretation   Normal sinus rhythm  Low voltage QRS  ST elevation consider lateral injury or acute infarct  Statement Not Found (#821)  Abnormal ECG  No previous ECGs available  ----------unconfirmed----------  Confirmed by OVERREAD, NOT ([id number]), editor PEARSON, BARBARA ([id number]) on 06/24/2013 9:59:52 AM  ED ECG     24-Mar-15 09:34, ECG  Ventricular Rate 86  Atrial Rate 86  P-R Interval 164  QRS Duration 84  QT 346  QTc 414  P Axis 49  R Axis 22  T Axis 9  ECG interpretation   Normal sinus rhythm  ST elevation consider lateral injury or acute infarct  *** ** ** ** * ACUTE MI  ** ** ** **  Abnormal ECG  No previous ECGs available  ----------unconfirmed----------  Confirmed by OVERREAD, NOT ([id number]), editor PEARSON, BARBARA ([id number]) on 06/24/2013 10:00:09 AM  ECG    Assessment/Plan:   Assessment/Plan:  Assessment IMP S/P STEMI Ant MI S/P PCI stent DES LAD DM HTN Obesity Hyperlipidemia DM .   Plan PLAN ASA 81 mg QD Plavix 75 mg daily x 1 yr B-blockers ACE Statin Cardiac rehab Wgt lost and exerise DM control D/C home today  RTW 07/06/13 F/U Adryanna Friedt 07/06/13 am   Electronic Signatures: Lujean Amel D (MD)  (Signed 26-Mar-15 10:12)  Authored: Chief Complaint, VITAL SIGNS/ANCILLARY NOTES, Brief Assessment, Lab Results, Radiology Results, Assessment/Plan   Last Updated: 26-Mar-15 10:12 by Lujean Amel D (MD)

## 2014-07-24 NOTE — Consult Note (Signed)
PATIENT NAME:  Karl Morgan, Karl Morgan MR#:  [id number] DATE OF BIRTH:  01-17-1961  DATE OF CONSULTATION:  06/23/2013  REFERRING PHYSICIAN:  Dwayne D. Clayborn Bigness, MD CONSULTING PHYSICIAN:  Karl Sink, MD  REASON FOR ADMISSION: ST elevation MI, acute, of the anterior wall.   CHIEF COMPLAINT:  chest pain.   PRIMARY CARE PHYSICIAN: Karl Dupre Myrla Halsted, MD.   HISTORY OF PRESENT ILLNESS:  [name] is a very nice 54 year old gentleman who has history of diabetes and hypertension. The patient is also taking testosterone and Viagra for erectile dysfunction and low energy. He was doing well up until 3 weeks ago, whenever he started having occasional chest pains after meals that he describes as indigestion. Last night, around 11:00 p.m. after dinner, the patient started having a fullness sensation and heaviness sensation at the level of the chest. He felt that it was ingestion so he provoked himself to vomit. After that, he continued to have pain all over the night, 5 to 6 out of 10 in intensity, for what the patient was not able to sleep. He says that he was able to sleep for an hour whenever his pain is starting to come down around 3 to 4. This morning he had an appointment to see his doctor in the office and he actually was having less discomfort. He said it was about 2 out of 10, for what he was apprehensive of coming into the Emergency Department. The patient was offered to have an ambulance from the clinic, but he decided to drive himself to the ER. The pain was mid sternal with radiation to the left arm and it was waxing and waning through the night. There was mild nausea associated but no significant shortness of breath although the patient states that lately, whenever he goes up stairs, he gets really short of breath and tired. He took some TUMS and Mylanta without any resolution of the symptoms. In the Emergency Department, the patient was evaluated and showed anterior ST elevation at the level of  leads III, IV and V5 for what it was about 2 mm elevation. He had significant elevation of his systolic blood pressures in the 40s for what the patient was sent to the cath lab. He had evidence of multivessel disease involving the LAD, total distal LAD, mid LAD and circ. The RCA had moderate disease. The patient had a mid LAD drug-eluting stent. Patient was transferred to the CCU after stent placement and we were asked to evaluate the patient for medical management.   REVIEW OF SYSTEMS:  A 12-system review of systems is done.   CONSTITUTIONAL: The patient denies any fever. Positive weight gain 3 years but no significant weight loss.  EYES: No blurry vision, double vision.  EARS, NOSE, THROAT: No tinnitus, ear pain, hearing loss.  RESPIRATORY: No cough, wheezing or hemoptysis. Positive shortness of breath whenever he walks distances more than 150 feet or 200 feet, or going upstairs. No painful respirations. No COPD.   CARDIOVASCULAR: Positive chest pain. No orthopnea, no edema. No arrhythmias. No syncope. No palpitations.  GASTROINTESTINAL: No nausea, vomiting, abdominal pain, constipation or diarrhea. Positive for indigestion as mentioned above.  GENITOURINARY: No dysuria, hematuria, changes in frequency.  ENDOCRINE: No polyuria, polydipsia, polyphagia, cold or heat intolerance.  HEMATOLOGIC AND LYMPHATIC: No anemia, easy bruising or bleeding.  SKIN: No rashes or petechiae.  MUSCULOSKELETAL: No significant neck pain or back pain.  NEUROLOGIC: No numbness, tingling, CVAs or TIA.   PSYCHIATRIC: No insomnia or  depression.   PAST MEDICAL HISTORY: 1.  Hypertension.  2.  Uncontrolled diabetes type 2, non-insulin-dependent, last hemoglobin A1c 8.1.  3.  Low testosterone.  4.  Hyperlipidemia with hypertriglyceridemia.  5.  Colon polyps.  ALLERGIES: Not known drug allergies.   PAST SURGICAL HISTORY: Colonoscopy with resection of polyps about 3 years ago. The patient is due for his next colonoscopy  now.   FAMILY HISTORY: Negative for coronary artery disease or MIs. Cancer runs in his family, multiple uncles, but the patient does not know what type of cancers. No diabetes in the family. Positive for hypertension in his father. His mom had COPD and his dad had significant dementia.   SOCIAL HISTORY: The patient is not a smoker he has never smoked. He is a recovered alcoholic, he has been clean for 14 years. He does not use any drugs. 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 He just started this work 2 months ago. He just recently moved into [place]. The patient has a diet that he is heavy on pork and fat.   MEDICATIONS: Metformin 1000 mg twice daily, lisinopril 20 mg daily, glimepiride 4 mg daily, testosterone 2 mg subcutaneously scheduled weekly.   PHYSICAL EXAMINATION: VITAL SIGNS: Currently his blood pressure is ranging about 254Y to 706C systolic, diastolics in between 69 to 94. His heart rate is about 70, temperature 98.5, pulse ox 98% on 2 liters nasal cannula.  GENERAL: The patient is alert, oriented x 3, in no acute distress at this moment. No respiratory distress. Hemodynamically stable.  HEENT: His pupils are equal and reactive. Extraocular movements are intact. Mucosa are moist. Anicteric sclerae. Pink conjunctivae. No oral lesions. No oropharyngeal exudates.  NECK: Supple. No JVD. No thyromegaly. No adenopathy. No rigidity.  CARDIOVASCULAR: Regular rate and rhythm. No murmurs, rubs or gallops are appreciated. No displacement of PMI. No tenderness to palpation of anterior chest wall.  LUNGS: Clear without any wheezing or crepitus. No use of accessory muscles.  ABDOMEN: Soft, nontender, nondistended. No hepatosplenomegaly. No masses. Bowel sounds are positive.  GENITAL: Negative for external lesions. The patient had a catheterization in the right inguinal area which is normal without any signs of hematomas.  EXTREMITIES: No edema, cyanosis or clubbing.   SKIN: Warm, no rashes or petechiae.  VASCULAR: Pulses +2. Capillary refill less than 3.  NEUROLOGIC: Cranial nerves II through XII intact. Strength is 5 out of 5 all 4 extremities.  PSYCHIATRY: Alert and oriented x 3 without any agitation.  LYMPHATIC: Negative for lymphadenopathy in neck or supraclavicular areas.   LABORATORY, DIAGNOSTIC AND RADIOLOGICAL DATA:    1.  Glucose is ranging about 217 to 258, creatinine 1.18, sodium 134, potassium 4.1. His LDL is 109, VDL is 52, triglycerides 261, HDL is low at 21. His hemoglobin A1c is 7.6. His AST is slightly elevated 101, other LFTs within normal limits. His total CK was 553 with a CK-MB of 12 and a troponin above 40.  2.  His white blood count was 11.5, hemoglobin 16, platelet count 181.  3.  Chest x-ray shows no active cardiopulmonary disease, no infiltrates.  4.  His ejection fraction is 40% to 45% on cardiac catheterization.   ASSESSMENT AND PLAN: This is a very nice 54 year old gentleman with diabetes, hypertension, hyperlipidemia, low testosterone, colon polyps, comes with acute ST elevation myocardial infarction.  1.  ST elevation myocardial infarction. The patient came to the Emergency Department from the clinic, having chest pain since 11:00 p.m. last  night. Likely, the intensity of the pain was not severe due to patient's diabetes, which could cause to have certain types of neuropathy and decreased pain perception. The patient went to the catheterization lab and had a drug-eluding stent placed x 2 on the left anterior descending artery. The patient has multiple-vessel disease and Dr. Clayborn Morgan was considering sending him to [facility] for cardiac bypass although he decided to put the stents for now. The patient is admitted to the Critical Care Unit and we were consulted to manage other medical problems.  2.  As far as his hypertension, we are going to start him on a beta blocker since the patient had a heart attack anyway and we are going to try to  keep his the blood pressures under control, at least below 140. Labetalol has been ordered for p.r.n. blood pressure management of more than 160.  3.  As far as his diabetes, his hemoglobin A1c last time in the office was 8.1, right now is 7.6. His blood sugars are above 200. We are going to try to keep his blood sugars definitely below 150 if possible without compromising him to have hypoglycemia. The patient is on metformin and glimepiride at home. Since he has been n.p.o. and he has had cardiac catheterization, we are going to hold metformin and glimepiride and start him on low-dose Lantus for now and insulin sliding scale. Check Accu-Cheks every 4 hours and correct from them. We have to be aggressive with his diabetes management. Continue diet. The patient will benefit with diabetes education as he is really not compliant with any diet.  4.  Hyperlipidemia. The patient has LDL above 100 with elevated triglycerides. He will benefit from starting a statin.  I put him on atorvastatin 20 mg once a day. The patient will benefit from niacin since his HDL is low and fenofibrate since his triglycerides are high.  For now, we are just going to start him on a statin and tailor his lipid medications later. It seems he is noncompliant for treatment.  5.  As far as his low testosterone, we are going to hold testosterone treatments for now.  6.  As far as his deep vein thrombosis prophylaxis, the patient is currently on Angiomax drip. He was on a heparin drip. That has been discontinued.  7.  Gastrointestinal prophylaxis. Start the patient on Protonix.  8.  Ischemic cardiomyopathy with ejection fraction of 40% to 45%. The patient is already taking lisinopril. Continue beta blocker for now. Do an echocardiogram within the next several weeks to see if there is any significant improvement after cardiac stents. Continue aspirin and Plavix as antiplatelet therapy.   TIME SPENT: I spent about 45 minutes with this patient.    Thank you, Dr. Clayborn Morgan, for allowing me to participate in the care of your patient.   ____________________________ Babbie Sink, MD rsg:cs D: 06/23/2013 14:16:00 ET T: 06/23/2013 15:12:47 ET JOB#: [id number]  cc: Shedd Sink, MD, <Dictator> Natnael Biederman America Brown MD ELECTRONICALLY SIGNED 06/28/2013 13:49

## 2014-07-24 NOTE — Consult Note (Signed)
Chief Complaint:  Subjective/Chief Complaint Pt pain free today and feels well. Denies sob or palps.   VITAL SIGNS/ANCILLARY NOTES: **Vital Signs.:   25-Mar-15 08:00  Vital Signs Type Routine  Temperature Temperature (F) 98.2  Celsius 36.7  Temperature Source oral  Pulse Pulse 78  Pulse source if not from Vital Sign Device per cardiac monitor  Systolic BP Systolic BP 93  Diastolic BP (mmHg) Diastolic BP (mmHg) 60  Mean BP 71  Pulse Ox % Pulse Ox % 94  Pulse Ox Activity Level  At rest  Oxygen Delivery Room Air/ 21 %  Pulse Ox Heart Rate 78  *Intake and Output.:   25-Mar-15 09:40  Grand Totals Intake:   Output:  500    Net:  -500 24 Hr.:  -13  Urine ml     Out:  500  Urinary Method  Urinal   Brief Assessment:  GEN well developed, well nourished, no acute distress   Cardiac Regular  murmur present   Respiratory normal resp effort  clear BS   Gastrointestinal Normal   Gastrointestinal details normal Soft   EXTR negative cyanosis/clubbing, negative edema   Lab Results: Hepatic:  24-Mar-15 09:20   Bilirubin, Total 0.8  Alkaline Phosphatase 97 (45-117 NOTE: New Reference Range 02/20/13)  SGPT (ALT) 53  SGOT (AST)  101  Total Protein, Serum 7.6  Albumin, Serum 4.3  Cardiology:  24-Mar-15 08:57   Ventricular Rate 86  Atrial Rate 86  P-R Interval 156  QRS Duration 82  QT 342  QTc 409  P Axis 41  R Axis 19  T Axis 9  ECG interpretation Normal sinus rhythm Low voltage QRS ST elevation consider lateral injury or acute infarct Statement Not Found (#821) Abnormal ECG No previous ECGs available ----------unconfirmed---------- Confirmed by OVERREAD, NOT ([id number]), editor PEARSON, BARBARA ([id number]) on 06/24/2013 9:59:52 AM    09:34   Ventricular Rate 86  Atrial Rate 86  P-R Interval 164  QRS Duration 84  QT 346  QTc 414  P Axis 49  R Axis 22  T Axis 9  ECG interpretation Normal sinus rhythm ST elevation consider lateral injury or acute infarct *** ** ** ** *  ACUTE MI  ** ** ** ** Abnormal ECG No previous ECGs available ----------unconfirmed---------- Confirmed by OVERREAD, NOT (100), editor PEARSON, BARBARA ([id number]) on 06/24/2013 10:00:09 AM  Routine Chem:  24-Mar-15 09:20   Glucose, Serum  258  BUN 14  Creatinine (comp) 1.18  Sodium, Serum  134  Potassium, Serum 4.1  Chloride, Serum 101  CO2, Serum 28  Calcium (Total), Serum 9.1  Anion Gap  5  Osmolality (calc) 278  eGFR (African American) >60  eGFR (Non-African American) >60 (eGFR values <77m/min/1.73 m2 may be an indication of chronic kidney disease (CKD). Calculated eGFR is useful in patients with stable renal function. The eGFR calculation will not be reliable in acutely ill patients when serum creatinine is changing rapidly. It is not useful in  patients on dialysis. The eGFR calculation may not be applicable to patients at the low and high extremes of body sizes, pregnant women, and vegetarians.)  Result Comment troponin - REPEATED AND CONFIRMED BY DILUTION  - c ann thompson,rn 06/23/2013 '@1057' /srb  - READ-BACK PROCESS PERFORMED.  Result(s) reported on 23 Jun 2013 at 10:04AM.  Hemoglobin A1c ([facility]  7.6 (The American Diabetes Association recommends that a primary goal of therapy should be <7% and that physicians should reevaluate the treatment regimen in patients with HbA1c values consistently >8%.)  13:04   Result Comment troponin - REPEATED AND CONFIRMED BY DILUTION  - previously called 06/23/2013 @ 1057  - by srb..srb  Result(s) reported on 23 Jun 2013 at 02:29PM.  Cholesterol, Serum 182  Triglycerides, Serum  261  HDL (INHOUSE)  21  VLDL Cholesterol Calculated  52  LDL Cholesterol Calculated  109 (Result(s) reported on 23 Jun 2013 at 01:49PM.)    17:13   Result Comment TROPONIN - RESULTS VERIFIED BY REPEAT TESTING.  - PREVIOUSLY CALLED/1057;06-23-13/BY [facility].  Result(s) reported on 23 Jun 2013 at 06:40PM.  Cardiac:  24-Mar-15 09:20   CK, Total  553 (39-308 NOTE:  NEW REFERENCE RANGE  05/04/2013)  CPK-MB, Serum  12.0 (Result(s) reported on 23 Jun 2013 at 12:50PM.)  Troponin I  > 40.00 (0.00-0.05 0.05 ng/mL or less: NEGATIVE  Repeat testing in 3-6 hrs  if clinically indicated. >0.05 ng/mL: POTENTIAL  MYOCARDIAL INJURY. Repeat  testing in 3-6 hrs if  clinically indicated. NOTE: An increase or decrease  of 30% or more on serial  testing suggests a  clinically important change)    13:04   CK, Total  1574 (39-308 NOTE: NEW REFERENCE RANGE  05/04/2013)  CPK-MB, Serum  42.8 (Result(s) reported on 23 Jun 2013 at 02:29PM.)  Troponin I  > 40.00 (0.00-0.05 0.05 ng/mL or less: NEGATIVE  Repeat testing in 3-6 hrs  if clinically indicated. >0.05 ng/mL: POTENTIAL  MYOCARDIAL INJURY. Repeat  testing in 3-6 hrs if  clinically indicated. NOTE: An increase or decrease  of 30% or more on serial  testing suggests a  clinically important change)    17:13   CK, Total  1156 (39-308 NOTE: NEW REFERENCE RANGE  05/04/2013)  CPK-MB, Serum  42.0 (Result(s) reported on 23 Jun 2013 at 06:49PM.)  Troponin I  > 40.00 (0.00-0.05 0.05 ng/mL or less: NEGATIVE  Repeat testing in 3-6 hrs  if clinically indicated. >0.05 ng/mL: POTENTIAL  MYOCARDIAL INJURY. Repeat  testing in 3-6 hrs if  clinically indicated. NOTE: An increase or decrease  of 30% or more on serial  testing suggests a  clinically important change)  Routine UA:  24-Mar-15 19:42   Color (UA) Straw  Clarity (UA) Clear  Glucose (UA) >=500  Ketones (UA) Negative  Specific Gravity (UA) 1.023  pH (UA) 6.0  Protein (UA) Negative  Nitrite (UA) Negative  Leukocyte Esterase (UA) Negative (Result(s) reported on 23 Jun 2013 at 08:32PM.)  RBC (UA) <1 /HPF  WBC (UA) <1 /HPF  Bacteria (UA) NONE SEEN  Epithelial Cells (UA) NONE SEEN  Result(s) reported on 23 Jun 2013 at 08:32PM.  Routine Hem:  24-Mar-15 09:20   WBC (CBC)  11.5  RBC (CBC) 5.34  Hemoglobin (CBC) 16.8  Hematocrit (CBC) 49.4   Platelet Count (CBC) 181  MCV 93  MCH 31.5  MCHC 34.0  RDW 13.0  Neutrophil % 76.1  Lymphocyte % 15.7  Monocyte % 6.3  Eosinophil % 1.3  Basophil % 0.6  Neutrophil #  8.8  Lymphocyte # 1.8  Monocyte # 0.7  Eosinophil # 0.1  Basophil # 0.1 (Result(s) reported on 23 Jun 2013 at 09:42AM.)   Radiology Results: XRay:    24-Mar-15 09:15, Chest Portable Single View  Chest Portable Single View   REASON FOR EXAM:    cp  COMMENTS:       PROCEDURE: DXR - DXR PORTABLE CHEST SINGLE VIEW  - Jun 23 2013  9:15AM     CLINICAL DATA:  Chest pain    EXAM:  PORTABLE CHEST - 1 VIEW    COMPARISON:  None.    FINDINGS:  Cardiomediastinal silhouette is unremarkable. No acute infiltrate or  pleural effusion. No pulmonary edema. Bony thorax is unremarkable.   IMPRESSION:  No active disease.      Electronically Signed    By: Lahoma Crocker M.D.    On: 06/23/2013 09:21         Verified By: Ephraim Hamburger, M.D.,  Cardiology:    24-Mar-15 08:57, ED ECG  Ventricular Rate 86  Atrial Rate 86  P-R Interval 156  QRS Duration 82  QT 342  QTc 409  P Axis 41  R Axis 19  T Axis 9  ECG interpretation   Normal sinus rhythm  Low voltage QRS  ST elevation consider lateral injury or acute infarct  Statement Not Found (#821)  Abnormal ECG  No previous ECGs available  ----------unconfirmed----------  Confirmed by OVERREAD, NOT ([id number]), editor PEARSON, BARBARA ([id number]) on 06/24/2013 9:59:52 AM  ED ECG     24-Mar-15 09:34, ECG  Ventricular Rate 86  Atrial Rate 86  P-R Interval 164  QRS Duration 84  QT 346  QTc 414  P Axis 49  R Axis 22  T Axis 9  ECG interpretation   Normal sinus rhythm  ST elevation consider lateral injury or acute infarct  *** ** ** ** * ACUTE MI  ** ** ** **  Abnormal ECG  No previous ECGs available  ----------unconfirmed----------  Confirmed by OVERREAD, NOT (100), editor PEARSON, BARBARA ([id number]) on 06/24/2013 10:00:09 AM  ECG    Assessment/Plan:  Assessment/Plan:   Assessment IMP S/P STEMI Ant MI S/P PCI stent LAD HTN DM Hyper;lipiidemia Obesity .   Plan PLAN Transfer to tele Increase activity ambulate in hall Lipitor 80 mg QD Metoprolol bid ACE post MI Agree with DM control Brillinta bid x 26yrHTN control ASA 81 mg daily Hopefu;lly d/c home in am   Electronic Signatures: CLujean AmelD (MD)  (Signed 25-Mar-15 12:37)  Authored: Chief Complaint, VITAL SIGNS/ANCILLARY NOTES, Brief Assessment, Lab Results, Radiology Results, Assessment/Plan   Last Updated: 25-Mar-15 12:37 by CYolonda Kida(MD)

## 2014-07-24 NOTE — Consult Note (Signed)
Brief Consult Note: Diagnosis: STEMI.   Patient was seen by consultant.   Consult note dictated.   Orders entered.   Comments: 54 yo male with stemi 2 stents/\ dm and htn control.  Electronic Signatures: James Ivanoff, Roselie Awkward (MD)  (Signed 24-Mar-15 12:34)  Authored: Brief Consult Note   Last Updated: 24-Mar-15 12:34 by James Ivanoff, Roselie Awkward (MD)

## 2014-07-24 NOTE — Consult Note (Signed)
PATIENT NAME:  Karl Morgan, Karl Morgan MR#:  [id number] DATE OF BIRTH:  [birth year]  DATE OF CONSULTATION:  06/23/2013  REFERRING PHYSICIAN:  Dr. Manuella Ghazi, as well as Dr. Cinda Quest in the emergency room. CONSULTING PHYSICIAN:  Denese Mentink D. Britainy Kozub, MD  INDICATION: Acute STEMI anterior wall myocardial infarction.   HISTORY OF PRESENT ILLNESS: 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 States he has a history of diabetes, obesity, low testosterone, hypertension, was in his usual state of health until yesterday when he started having what he describes as indigestion or chest pain, midsternal with radiation to his arm. He had it off and on all evening long, starting at about 8:00 p.m. The did not essentially go away. He got sweaty, he got hot. He took a shower. He took Nurse, adult or Mylanta. His symptoms waxed and waned. He finally came in the morning. He went to his primary doctor. After describing the symptoms, his primary doctor told him to come to the Emergency Room by ambulance. The patient refused and drove himself. On evaluation in the ER at about 9:00, he had EKG done which showed anterior ST elevation, III, IV and V, of at least 2 mm in normal sinus rhythm. He was also hypertensive at about 200, so, at that point, a STEMI was initiated. There was a patient on the table in the Cath Lab at the time, that we removed, and brought [name] to the Cath Lab for evaluation. Initial cath showed evidence of multivessel disease involving the LAD, which he had total distal LAD in 1995, mid LAD, also had significant disease in the circ and a diagonal. RCA had mild to moderate disease. EF was somewhere around to 40% to 45% with anterior hypo. There was consideration about sending him to surgery, but our initial reaction was to try to balloon open the vessels to allow flow and then maybe transfer him to surgery. After ballooning the vessels, he had some flow with dissection in the distal LAD  and reduction of stenosis in the mid LAD. He was then, at that point, reconsidered for possible treatment of the LAD with partial revascularization but with effort of avoiding surgery, so our plan changed and we decided to stent the distal LAD, as well as the mid LAD with DES.  The patient did reasonably well and our plan was to essentially treat additional vessels medically. The decision tree about possible surgery, then reconsideration for intervention may have delayed his door to balloon time slightly, but the patient was a late presenter to begin with by presenting over 12 hours after his initial symptoms began.   REVIEW OF SYSTEMS: The patient complains of shortness of breath, chest pain, sweating, mild weakness, mild fatigue, low testosterone, hypertension. Denies any blackouts or syncope. Denies any significant nausea. No vomiting. No fever. No chills. No weight loss. No weight gain. No hemoptysis or hematemesis. Denies bright red blood per rectum.   PAST MEDICAL HISTORY: Again, diabetes, obesity, low T, hypertension.   FAMILY HISTORY: Noncontributory.   SOCIAL HISTORY: Married. Denies smoking. He is an alcoholic but recovering. Has not drunk anything in over 14 years and works in maintenance.   MEDICATIONS: He is on testosterone 2 mg subQ, metformin 1000 mg twice a day, lisinopril 20 mg once a day, glimepiride 4 mg a day.   ALLERGIES: He states none   LABORATORY DATA: Glucose was 239. White count 11.5, H and H 16.8 and 49, platelet count of  181. BUN of 14, creatinine 1.18, sodium 138, potassium 4.1, chloride of 101, CO2 of 28. LFTs negative. Troponin was greater than 40, total CK 553, MB 12. EKG: Again, normal sinus rhythm, diffuse ST elevation anteriorly of 2 mm.   IMPRESSION:  1.  Acute ST elevation myocardial infarction, late presenter, anterior myocardial infarction.  2.  Hypertension, malignant.  3.  Obesity.  4.  Diabetes.  5.  History of alcohol abuse.  6.  Abnormal EKG.    7.   Probable hyperlipidemia.   PLAN: Agree with admit. Take to the Cath Lab for immediate cardiac cath with possible intervention. Proceed with aggressive blood pressure control. Proceed with diabetes management. We will proceed with lipid management and add statin therapy. Recommend weight loss, exercise. Recommend cardiac rehab. Consider whether intervention is necessary, whether percutaneous or surgery. We will base further evaluation on the results of the cath.   ____________________________ Jacalynn Buzzell D. Clayborn Bigness, MD ddc:dmm D: 06/23/2013 12:57:00 ET T: 06/23/2013 13:15:13 ET JOB#: [id number]  cc: Andres Vest D. Clayborn Bigness, MD, <Dictator> Yolonda Kida MD ELECTRONICALLY SIGNED 07/14/2013 12:40

## 2014-09-28 ENCOUNTER — Telehealth: Payer: Self-pay | Admitting: Family Medicine

## 2014-09-28 DIAGNOSIS — E785 Hyperlipidemia, unspecified: Secondary | ICD-10-CM

## 2014-09-28 DIAGNOSIS — E1165 Type 2 diabetes mellitus with hyperglycemia: Secondary | ICD-10-CM

## 2014-09-28 DIAGNOSIS — IMO0002 Reserved for concepts with insufficient information to code with codable children: Secondary | ICD-10-CM

## 2014-09-28 NOTE — Telephone Encounter (Signed)
Have upcoming appointment for 10-07-14 @ 4p. States he normally do lab work before coming to his appointment. He is requesting that an lab order be sent over to the hospital so he is able to do it tomorrow before he goes out of town. Once this is done please call to let him know.

## 2014-09-29 NOTE — Telephone Encounter (Signed)
Called Pt, lm that he needs to go to [facility] at [place] 3-4 days prior to his 10-07-14 appt.  Will need to be fasting when going for blood draw.

## 2014-09-29 NOTE — Telephone Encounter (Signed)
Lab work including hemoglobin A1c and fasting lipid panel is entered

## 2014-09-29 NOTE — Telephone Encounter (Signed)
Here's one 

## 2014-10-07 ENCOUNTER — Other Ambulatory Visit: Payer: Self-pay | Admitting: Family Medicine

## 2014-10-07 ENCOUNTER — Ambulatory Visit (INDEPENDENT_AMBULATORY_CARE_PROVIDER_SITE_OTHER): Payer: 59 | Admitting: Family Medicine

## 2014-10-07 ENCOUNTER — Encounter: Payer: Self-pay | Admitting: Family Medicine

## 2014-10-07 VITALS — BP 124/82 | HR 73 | Wt 255.0 lb

## 2014-10-07 DIAGNOSIS — M25512 Pain in left shoulder: Secondary | ICD-10-CM | POA: Diagnosis not present

## 2014-10-07 DIAGNOSIS — E785 Hyperlipidemia, unspecified: Secondary | ICD-10-CM

## 2014-10-07 DIAGNOSIS — I219 Acute myocardial infarction, unspecified: Secondary | ICD-10-CM | POA: Insufficient documentation

## 2014-10-07 DIAGNOSIS — E349 Endocrine disorder, unspecified: Secondary | ICD-10-CM | POA: Insufficient documentation

## 2014-10-07 DIAGNOSIS — I2510 Atherosclerotic heart disease of native coronary artery without angina pectoris: Secondary | ICD-10-CM | POA: Insufficient documentation

## 2014-10-07 DIAGNOSIS — E1165 Type 2 diabetes mellitus with hyperglycemia: Secondary | ICD-10-CM

## 2014-10-07 DIAGNOSIS — IMO0002 Reserved for concepts with insufficient information to code with codable children: Secondary | ICD-10-CM

## 2014-10-07 HISTORY — DX: Acute myocardial infarction, unspecified: I21.9

## 2014-10-07 NOTE — Progress Notes (Signed)
Name: Karl Morgan   MRN: [id number]    DOB: 04/12/1960   Date:10/07/2014       Progress Note  Subjective  Chief Complaint  Chief Complaint  Patient presents with  . Diabetes    3 month f/u    Diabetes He presents for his follow-up diabetic visit. He has type 2 diabetes mellitus. His disease course has been stable. There are no hypoglycemic associated symptoms. Pertinent negatives for diabetes include no chest pain, no polydipsia and no polyuria. Symptoms are stable. Current diabetic treatment includes oral agent (triple therapy). An ACE inhibitor/angiotensin II receptor blocker is being taken.  Hyperlipidemia This is a chronic problem. Recent lipid tests were reviewed and are high. Exacerbating diseases include diabetes. Associated symptoms include shortness of breath. Pertinent negatives include no chest pain or myalgias. Current antihyperlipidemic treatment includes statins. There are no compliance problems.  Risk factors for coronary artery disease include dyslipidemia, obesity, male sex and stress.  Shoulder Pain  The pain is present in the left shoulder and back. This is a new problem. There has been no history of extremity trauma. The problem occurs intermittently. The problem has been unchanged. The quality of the pain is described as aching. The pain is mild. Pertinent negatives include no fever, inability to bear weight, joint locking, numbness or stiffness. The symptoms are aggravated by activity. Family history does not include gout. His past medical history is significant for diabetes.      Past Medical History  Diagnosis Date  . Type II or unspecified type diabetes mellitus without mention of complication, uncontrolled 04/04/2011  . Unspecified essential hypertension 04/04/2011  . Hyperlipidemia 04/04/2011  . Hypogonadism male 04/04/2011  . Alopecia 04/04/2011  . Erectile dysfunction 04/04/2011  . Diabetes mellitus   . Substance abuse   . Heart murmur     No past surgical history  on file.  Family History  Problem Relation Age of Onset  . Hypertension Father   . Dementia Father   . Cancer Neg Hx   . Diabetes Neg Hx   . Heart disease Neg Hx   . Stroke Neg Hx   . Emphysema Mother     History   Social History  . Marital Status: Married    Spouse Name: N/A  . Number of Children: N/A  . Years of Education: N/A   Occupational History  . OPERATIONS MANAGER of [employer]   Social History Main Topics  . Smoking status: Never Smoker   . Smokeless tobacco: Never Used  . Alcohol Use: No  . Drug Use: No  . Sexual Activity: Yes   Other Topics Concern  . Not on file   Social History Narrative   Caffienated drinks-no   Seat belt use often-yes   Regular Exercise-no   Smoke alarm in the home-yes   Firearms/guns in the home-yes   History of physical abuse-no                 Current outpatient prescriptions:  .  aspirin 81 MG tablet, Take 81 mg by mouth daily., Disp: , Rfl:  .  Blood Glucose Monitoring Suppl (CONTOUR NEXT EZ MONITOR) W/DEVICE KIT, 1 Act by Does not apply route 2 (two) times daily., Disp: 1 kit, Rfl: 0 .  Clopidogrel Bisulfate (PLAVIX PO), Take by mouth daily., Disp: , Rfl:  .  glimepiride (AMARYL) 4 MG tablet, TAKE 1 TABLET (4 MG TOTAL) BY MOUTH DAILY BEFORE BREAKFAST., Disp: 90 tablet, Rfl: 1 .  lisinopril (  PRINIVIL,ZESTRIL) 20 MG tablet, TAKE 1 TABLET (20 MG TOTAL) BY MOUTH DAILY., Disp: 90 tablet, Rfl: 1 .  metFORMIN (GLUCOPHAGE) 850 MG tablet, Take 850 mg by mouth 2 (two) times daily with a meal., Disp: , Rfl:  .  sildenafil (VIAGRA) 100 MG tablet, Take 0.5-1 tablets (50-100 mg total) by mouth daily as needed for erectile dysfunction., Disp: 5 tablet, Rfl: 11 .  sitaGLIPtin (JANUVIA) 100 MG tablet, Take 100 mg by mouth daily., Disp: , Rfl:  .  vitamin B-12 (CYANOCOBALAMIN) 250 MCG tablet, TAKE 1 TABLET (250 MCG TOTAL) BY MOUTH DAILY., Disp: 90 tablet, Rfl: 2  Current facility-administered medications:  .  testosterone  cypionate (DEPOTESTOTERONE CYPIONATE) injection 200 mg, 200 mg, Intramuscular, Q14 Days, Janith Lima, MD, 200 mg at 07/29/12 1635  Allergies  Allergen Reactions  . Pravachol [Pravastatin Sodium]     Muscle aches     Review of Systems  Constitutional: Negative for fever.  Respiratory: Positive for shortness of breath.   Cardiovascular: Negative for chest pain.  Gastrointestinal: Negative for nausea, vomiting, diarrhea and constipation.  Musculoskeletal: Positive for joint pain. Negative for myalgias, back pain and stiffness.  Neurological: Negative for numbness.  Endo/Heme/Allergies: Negative for polydipsia.      Objective  Filed Vitals:   10/07/14 1608  BP: 124/82  Pulse: 73  Weight: 255 lb (115.667 kg)  SpO2: 97%    Physical Exam  Constitutional: He is oriented to person, place, and time and well-developed, well-nourished, and in no distress.  HENT:  Head: Normocephalic and atraumatic.  Eyes: Pupils are equal, round, and reactive to light.  Cardiovascular: Normal rate and regular rhythm.   Pulmonary/Chest: Effort normal and breath sounds normal.  Abdominal: Soft. Bowel sounds are normal.  Musculoskeletal:       Left shoulder: He exhibits bony tenderness and pain. He exhibits normal range of motion, no swelling, no effusion, no spasm and normal strength.  Neurological: He is alert and oriented to person, place, and time.  Skin: Skin is warm.  Nursing note and vitals reviewed.    Assessment & Plan 1. Diabetes mellitus type 2, uncontrolled  - HgB A1c  2. Hyperlipidemia  - Lipid Profile - Comprehensive metabolic panel  3. Acute pain of left shoulder Conservative treatment including gentle range of motion recommended. Patient had a complete workup for cardiac etiologies, which was negative.    Lindsey Demonte Asad A. [facility] 10/07/2014 7:09 PM

## 2014-10-13 ENCOUNTER — Other Ambulatory Visit
Admission: RE | Admit: 2014-10-13 | Discharge: 2014-10-13 | Disposition: A | Discharge status: Home / Self Care | Payer: 59 | Source: Ambulatory Visit | Attending: Family Medicine | Admitting: Family Medicine

## 2014-10-13 DIAGNOSIS — E1165 Type 2 diabetes mellitus with hyperglycemia: Secondary | ICD-10-CM | POA: Insufficient documentation

## 2014-10-13 LAB — COMPREHENSIVE METABOLIC PANEL
ALT: 61 U/L (ref 17–63)
AST: 40 U/L (ref 15–41)
Albumin: 4.3 g/dL (ref 3.5–5.0)
Alkaline Phosphatase: 189 U/L — ABNORMAL HIGH (ref 38–126)
Anion gap: 6 (ref 5–15)
BUN: 16 mg/dL (ref 6–20)
CO2: 25 mmol/L (ref 22–32)
Calcium: 9.1 mg/dL (ref 8.9–10.3)
Chloride: 105 mmol/L (ref 101–111)
Creatinine, Ser: 1.12 mg/dL (ref 0.61–1.24)
GFR calc Af Amer: >60 mL/min (ref >60)
GFR calc non Af Amer: >60 mL/min (ref >60)
Glucose, Bld: 105 mg/dL — ABNORMAL HIGH (ref 65–99)
Potassium: 4.2 mmol/L (ref 3.5–5.1)
Sodium: 136 mmol/L (ref 135–145)
Total Bilirubin: 0.8 mg/dL (ref 0.3–1.2)
Total Protein: 7.1 g/dL (ref 6.5–8.1)

## 2014-10-13 LAB — LIPID PANEL
Cholesterol: 120 mg/dL (ref 0–200)
HDL: 30 mg/dL — ABNORMAL LOW (ref >40)
LDL Cholesterol: 73 mg/dL (ref 0–99)
Total CHOL/HDL Ratio: 4 RATIO
Triglycerides: 87 mg/dL (ref <150)
VLDL: 17 mg/dL (ref 0–40)

## 2014-10-13 LAB — HEMOGLOBIN A1C: Hgb A1c MFr Bld: 7.3 % — ABNORMAL HIGH (ref 4.0–6.0)

## 2014-10-18 ENCOUNTER — Other Ambulatory Visit: Payer: Self-pay | Admitting: Family Medicine

## 2014-10-18 DIAGNOSIS — E119 Type 2 diabetes mellitus without complications: Secondary | ICD-10-CM

## 2014-11-04 ENCOUNTER — Other Ambulatory Visit: Payer: Self-pay | Admitting: Family Medicine

## 2014-12-27 ENCOUNTER — Other Ambulatory Visit: Payer: Self-pay | Admitting: Family Medicine

## 2015-01-11 ENCOUNTER — Other Ambulatory Visit: Payer: Self-pay | Admitting: Family Medicine

## 2015-01-20 ENCOUNTER — Ambulatory Visit: Payer: 59 | Admitting: Family Medicine

## 2015-01-24 ENCOUNTER — Ambulatory Visit (INDEPENDENT_AMBULATORY_CARE_PROVIDER_SITE_OTHER): Payer: 59 | Admitting: Family Medicine

## 2015-01-24 ENCOUNTER — Encounter: Payer: Self-pay | Admitting: Family Medicine

## 2015-01-24 ENCOUNTER — Other Ambulatory Visit: Payer: Self-pay | Admitting: Family Medicine

## 2015-01-24 VITALS — BP 136/82 | HR 82 | Temp 98.4°F | Resp 16 | Ht 72.0 in | Wt 248.6 lb

## 2015-01-24 DIAGNOSIS — E1165 Type 2 diabetes mellitus with hyperglycemia: Secondary | ICD-10-CM | POA: Diagnosis not present

## 2015-01-24 DIAGNOSIS — E785 Hyperlipidemia, unspecified: Secondary | ICD-10-CM

## 2015-01-24 DIAGNOSIS — IMO0001 Reserved for inherently not codable concepts without codable children: Secondary | ICD-10-CM

## 2015-01-24 DIAGNOSIS — E119 Type 2 diabetes mellitus without complications: Secondary | ICD-10-CM

## 2015-01-24 LAB — POCT GLYCOSYLATED HEMOGLOBIN (HGB A1C): Hemoglobin A1C: 8.1

## 2015-01-24 LAB — GLUCOSE, POCT (MANUAL RESULT ENTRY): POC Glucose: 117 mg/dl — AB (ref 70–99)

## 2015-01-24 NOTE — Progress Notes (Signed)
Name: Karl Morgan   MRN: [id number]    DOB: 08-29-1960   Date:01/24/2015       Progress Note  Subjective  Chief Complaint  Chief Complaint  Patient presents with  . Thyroid Problem  . Hypertension  . Gout    Diabetes He presents for his follow-up diabetic visit. He has type 2 diabetes mellitus. There are no hypoglycemic associated symptoms. Symptoms are stable. Risk factors for coronary artery disease include diabetes mellitus. He is following a diabetic diet. An ACE inhibitor/angiotensin II receptor blocker is being taken.  Hyperlipidemia This is a chronic problem. The problem is controlled. Exacerbating diseases include diabetes and obesity. Pertinent negatives include no leg pain or myalgias. Current antihyperlipidemic treatment includes statins. Risk factors for coronary artery disease include dyslipidemia, diabetes mellitus, obesity, male sex, stress and hypertension.   Past Medical History  Diagnosis Date  . Type II or unspecified type diabetes mellitus without mention of complication, uncontrolled 04/04/2011  . Unspecified essential hypertension 04/04/2011  . Hyperlipidemia 04/04/2011  . Hypogonadism male 04/04/2011  . Alopecia 04/04/2011  . Erectile dysfunction 04/04/2011  . Diabetes mellitus   . Substance abuse   . Heart murmur     No past surgical history on file.  Family History  Problem Relation Age of Onset  . Hypertension Father   . Dementia Father   . Cancer Neg Hx   . Diabetes Neg Hx   . Heart disease Neg Hx   . Stroke Neg Hx   . Emphysema Mother     Social History   Social History  . Marital Status: Married    Spouse Name: N/A  . Number of Children: N/A  . Years of Education: N/A   Occupational History  . OPERATIONS MANAGER of maintenance [place]   Social History Main Topics  . Smoking status: Never Smoker   . Smokeless tobacco: Never Used  . Alcohol Use: No  . Drug Use: No  . Sexual Activity: Yes   Other Topics Concern  . Not on file    Social History Narrative   Caffienated drinks-no   Seat belt use often-yes   Regular Exercise-no   Smoke alarm in the home-yes   Firearms/guns in the home-yes   History of physical abuse-no                 Current outpatient prescriptions:  .  aspirin 81 MG tablet, Take 81 mg by mouth daily., Disp: , Rfl:  .  atorvastatin (LIPITOR) 80 MG tablet, TAKE 1 TABLET BY MOUTH ONCE DAILY, Disp: 30 tablet, Rfl: 2 .  Blood Glucose Monitoring Suppl (CONTOUR NEXT EZ MONITOR) W/DEVICE KIT, 1 Act by Does not apply route 2 (two) times daily., Disp: 1 kit, Rfl: 0 .  Clopidogrel Bisulfate (PLAVIX PO), Take by mouth daily., Disp: , Rfl:  .  glimepiride (AMARYL) 4 MG tablet, TAKE 1 TABLET BY MOUTH ONCE DAILY, Disp: 30 tablet, Rfl: 2 .  JANUVIA 100 MG tablet, TAKE 1 TABLET BY MOUTH ONCE DAILY, Disp: 90 tablet, Rfl: 0 .  lisinopril (PRINIVIL,ZESTRIL) 5 MG tablet, TAKE 1 TABLET BY MOUTH ONCE DAILY, Disp: 30 tablet, Rfl: 2 .  metFORMIN (GLUCOPHAGE) 850 MG tablet, Take 1 tablet (850 mg total) by mouth 2 (two) times daily with a meal., Disp: 60 tablet, Rfl: 2 .  metoprolol succinate (TOPROL-XL) 25 MG 24 hr tablet, TAKE 1 TABLET BY MOUTH ONCE DAILY, Disp: 30 tablet, Rfl: 2 .  sildenafil (VIAGRA) 100 MG tablet, Take 0.5-1  tablets (50-100 mg total) by mouth daily as needed for erectile dysfunction., Disp: 5 tablet, Rfl: 11 .  vitamin B-12 (CYANOCOBALAMIN) 250 MCG tablet, TAKE 1 TABLET (250 MCG TOTAL) BY MOUTH DAILY., Disp: 90 tablet, Rfl: 2  Current facility-administered medications:  .  testosterone cypionate (DEPOTESTOTERONE CYPIONATE) injection 200 mg, 200 mg, Intramuscular, Q14 Days, Janith Lima, MD, 200 mg at 07/29/12 1635  Allergies  Allergen Reactions  . Pravachol [Pravastatin Sodium]     Muscle aches     Review of Systems  Musculoskeletal: Negative for myalgias.      Objective  Filed Vitals:   01/24/15 1536  BP: 136/82  Pulse: 82  Temp: 98.4 F (36.9 C)  Resp: 16  Height: 6'  (1.829 m)  Weight: 248 lb 9 oz (112.747 kg)  SpO2: 96%    Physical Exam  Constitutional: He is oriented to person, place, and time and well-developed, well-nourished, and in no distress.  HENT:  Head: Normocephalic and atraumatic.  Cardiovascular: Normal rate, regular rhythm and normal heart sounds.   No murmur heard. Pulmonary/Chest: Effort normal and breath sounds normal. He has no wheezes.  Abdominal: Soft. Bowel sounds are normal. He exhibits no distension. There is no tenderness.  Neurological: He is alert and oriented to person, place, and time.  Nursing note and vitals reviewed.   Assessment & Plan  1. Uncontrolled type 2 diabetes mellitus without complication, without long-term current use of insulin (HCC) We'll obtain A1c and adjust medication as appropriate. - POCT Glucose (CBG) - POCT HgB A1C  2. Hyperlipidemia  - Lipid Profile - Comprehensive Metabolic Panel (CMET)    Leeanna Slaby [facility] 01/24/2015 3:44 PM

## 2015-01-27 ENCOUNTER — Other Ambulatory Visit
Admission: RE | Admit: 2015-01-27 | Discharge: 2015-01-27 | Disposition: A | Discharge status: Home / Self Care | Payer: 59 | Source: Ambulatory Visit | Attending: Family Medicine | Admitting: Family Medicine

## 2015-01-27 DIAGNOSIS — Z029 Encounter for administrative examinations, unspecified: Secondary | ICD-10-CM | POA: Insufficient documentation

## 2015-01-27 LAB — COMPREHENSIVE METABOLIC PANEL
ALT: 43 U/L (ref 17–63)
AST: 34 U/L (ref 15–41)
Albumin: 4.2 g/dL (ref 3.5–5.0)
Alkaline Phosphatase: 141 U/L — ABNORMAL HIGH (ref 38–126)
Anion gap: 6 (ref 5–15)
BUN: 13 mg/dL (ref 6–20)
CO2: 26 mmol/L (ref 22–32)
Calcium: 9.3 mg/dL (ref 8.9–10.3)
Chloride: 105 mmol/L (ref 101–111)
Creatinine, Ser: 1.07 mg/dL (ref 0.61–1.24)
GFR calc Af Amer: >60 mL/min (ref >60)
GFR calc non Af Amer: >60 mL/min (ref >60)
Glucose, Bld: 138 mg/dL — ABNORMAL HIGH (ref 65–99)
Potassium: 4.1 mmol/L (ref 3.5–5.1)
Sodium: 137 mmol/L (ref 135–145)
Total Bilirubin: 0.8 mg/dL (ref 0.3–1.2)
Total Protein: 7.1 g/dL (ref 6.5–8.1)

## 2015-01-27 LAB — LIPID PANEL
Cholesterol: 110 mg/dL (ref 0–200)
HDL: 29 mg/dL — ABNORMAL LOW (ref >40)
LDL Cholesterol: 59 mg/dL (ref 0–99)
Total CHOL/HDL Ratio: 3.8 RATIO
Triglycerides: 108 mg/dL (ref <150)
VLDL: 22 mg/dL (ref 0–40)

## 2015-02-08 ENCOUNTER — Other Ambulatory Visit: Payer: Self-pay | Admitting: Family Medicine

## 2015-02-14 ENCOUNTER — Ambulatory Visit (INDEPENDENT_AMBULATORY_CARE_PROVIDER_SITE_OTHER): Payer: 59 | Admitting: Family Medicine

## 2015-02-14 VITALS — BP 128/90 | HR 84 | Temp 97.6°F | Resp 16 | Ht 73.0 in | Wt 246.0 lb

## 2015-02-14 DIAGNOSIS — J0100 Acute maxillary sinusitis, unspecified: Secondary | ICD-10-CM | POA: Diagnosis not present

## 2015-02-14 MED ORDER — AMOXICILLIN-POT CLAVULANATE 875-125 MG PO TABS: 1.0000 | ORAL_TABLET | Freq: Two times a day (BID) | ORAL | Status: DC

## 2015-02-14 NOTE — Progress Notes (Signed)
This chart was scribed for Karl Haber, MD by Moises Blood, medical scribe at [facility].The patient was seen in exam room 14 and the patient's care was started at 11:28 AM.  Patient ID: Bart F San Morgan MRN: [id number], DOB: 1961/01/01, 54 y.o. Date of Encounter: 02/14/2015  Primary Physician: Keith Rake, MD  Chief Complaint:  Chief Complaint  Patient presents with  . Cough    x 3 day   . Nasal Congestion    x 3 day     HPI:  Karl Morgan is a 54 y.o. male who presents to [facility] complaining of cough with nasal congestion and rhinorrhea that started 3 days ago. He's been taking tylenol but without much relief. His right ear has been a little tender but does not believe that it's infected. He denies asthma.   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   Past Medical History  Diagnosis Date  . Type II or unspecified type diabetes mellitus without mention of complication, uncontrolled 04/04/2011  . Unspecified essential hypertension 04/04/2011  . Hyperlipidemia 04/04/2011  . Hypogonadism male 04/04/2011  . Alopecia 04/04/2011  . Erectile dysfunction 04/04/2011  . Diabetes mellitus   . Substance abuse   . Heart murmur      Home Meds: Prior to Admission medications   Medication Sig Start Date End Date Taking? Authorizing Provider  aspirin 81 MG tablet Take 81 mg by mouth daily.   Yes Historical Provider, MD  atorvastatin (LIPITOR) 80 MG tablet TAKE 1 TABLET BY MOUTH ONCE DAILY 01/12/15  Yes Roselee Nova, MD  Blood Glucose Monitoring Suppl (CONTOUR NEXT EZ MONITOR) W/DEVICE KIT 1 Act by Does not apply route 2 (two) times daily. 12/05/11  Yes Janith Lima, MD  Clopidogrel Bisulfate (PLAVIX PO) Take by mouth daily.   Yes Historical Provider, MD  glimepiride (AMARYL) 4 MG tablet TAKE 1 TABLET BY MOUTH ONCE DAILY 12/29/14  Yes Roselee Nova, MD  JANUVIA 100 MG tablet TAKE 1 TABLET BY MOUTH ONCE DAILY 01/12/15   Yes Roselee Nova, MD  lisinopril (PRINIVIL,ZESTRIL) 5 MG tablet TAKE 1 TABLET BY MOUTH ONCE DAILY 02/08/15  Yes Roselee Nova, MD  metFORMIN (GLUCOPHAGE) 850 MG tablet Take 1 tablet (850 mg total) by mouth 2 (two) times daily with a meal. 01/24/15  Yes Roselee Nova, MD  metoprolol succinate (TOPROL-XL) 25 MG 24 hr tablet TAKE 1 TABLET BY MOUTH ONCE DAILY 01/12/15  Yes Roselee Nova, MD  sildenafil (VIAGRA) 100 MG tablet Take 0.5-1 tablets (50-100 mg total) by mouth daily as needed for erectile dysfunction. 11/10/12  Yes Wendie Agreste, MD  vitamin B-12 (CYANOCOBALAMIN) 250 MCG tablet TAKE 1 TABLET (250 MCG TOTAL) BY MOUTH DAILY. 01/11/13  Yes Janith Lima, MD    Allergies:  Allergies  Allergen Reactions  . Pravachol [Pravastatin Sodium]     Muscle aches    Social History   Social History  . Marital Status: Married    Spouse Name: N/A  . Number of Children: N/A  . Years of Education: N/A   Occupational History  . OPERATIONS MANAGER of maintenance [place]   Social History Main Topics  . Smoking status: Never Smoker   . Smokeless tobacco: Never Used  . Alcohol Use: No  . Drug Use: No  . Sexual Activity: Yes   Other Topics Concern  . Not  on file   Social History Narrative   Caffienated drinks-no   Seat belt use often-yes   Regular Exercise-no   Smoke alarm in the home-yes   Firearms/guns in the home-yes   History of physical abuse-no                 Review of Systems: Constitutional: negative for fever, chills, night sweats, weight changes, or fatigue  HEENT: negative for vision changes, hearing loss, ST, epistaxis, or sinus pressure; positive for rhinorrhea, congestion Cardiovascular: negative for chest pain or palpitations Respiratory: negative for hemoptysis, wheezing, shortness of breath; positive for cough Abdominal: negative for abdominal pain, nausea, vomiting, diarrhea, or constipation Dermatological: negative for rash Neurologic:  negative for headache, dizziness, or syncope All other systems reviewed and are otherwise negative with the exception to those above and in the HPI.  Physical Exam: Blood pressure 128/90, pulse 84, temperature 97.6 F (36.4 C), temperature source Oral, resp. rate 16, height '6\' 1"'  (1.854 m), weight 246 lb (111.585 kg), SpO2 98 %., Body mass index is 32.46 kg/(m^2). General: Well developed, well nourished, in no acute distress. Head: Normocephalic, atraumatic, eyes without discharge, sclera non-icteric, Bilateral auditory canals clear, TM's are without perforation, pearly grey and translucent with reflective cone of light bilaterally. Oral cavity moist, posterior pharynx without exudate, erythema, peritonsillar abscess, or post nasal drip; swollen nasal passages Neck: Supple. No thyromegaly. Full ROM. No lymphadenopathy. Lungs: Clear bilaterally to auscultation without wheezes, rales, or rhonchi. Breathing is unlabored. Heart: RRR with S1 S2. No murmurs, rubs, or gallops appreciated. Msk:  Strength and tone normal for age. Extremities/Skin: Warm and dry. No clubbing or cyanosis. No edema. No rashes or suspicious lesions. Neuro: Alert and oriented X 3. Moves all extremities spontaneously. Gait is normal. CNII-XII grossly in tact. Psych:  Responds to questions appropriately with a normal affect.    ASSESSMENT AND PLAN:  54 y.o. year old male with acute sinu-itis This chart was scribed in my presence and reviewed by me personally.    ICD-9-CM ICD-10-CM   1. Acute maxillary sinusitis, recurrence not specified 461.0 J01.00 amoxicillin-clavulanate (AUGMENTIN) 875-125 MG tablet     Signed, Karl Haber, MD    By signing my name below, I, Moises Blood, attest that this documentation has been prepared under the direction and in the presence of Karl Haber, MD. Electronically Signed: Moises Blood, Scribe. 02/14/2015 , 11:28 AM .  Signed, Karl Haber, MD 02/14/2015 11:28  AM

## 2015-02-14 NOTE — Patient Instructions (Signed)

## 2015-02-16 ENCOUNTER — Other Ambulatory Visit: Payer: Self-pay | Admitting: Family Medicine

## 2015-02-16 ENCOUNTER — Inpatient Hospital Stay
Admission: EM | Admit: 2015-02-16 | Discharge: 2015-02-17 | DRG: 066 | Disposition: A | Discharge status: Home / Self Care | Payer: 59 | Attending: Internal Medicine | Admitting: Internal Medicine

## 2015-02-16 ENCOUNTER — Emergency Department: Payer: 59

## 2015-02-16 ENCOUNTER — Encounter: Payer: Self-pay | Admitting: Internal Medicine

## 2015-02-16 DIAGNOSIS — Z888 Allergy status to other drugs, medicaments and biological substances status: Secondary | ICD-10-CM

## 2015-02-16 DIAGNOSIS — Z8249 Family history of ischemic heart disease and other diseases of the circulatory system: Secondary | ICD-10-CM

## 2015-02-16 DIAGNOSIS — R011 Cardiac murmur, unspecified: Secondary | ICD-10-CM | POA: Diagnosis present

## 2015-02-16 DIAGNOSIS — IMO0001 Reserved for inherently not codable concepts without codable children: Secondary | ICD-10-CM

## 2015-02-16 DIAGNOSIS — I252 Old myocardial infarction: Secondary | ICD-10-CM | POA: Diagnosis not present

## 2015-02-16 DIAGNOSIS — I2510 Atherosclerotic heart disease of native coronary artery without angina pectoris: Secondary | ICD-10-CM | POA: Diagnosis present

## 2015-02-16 DIAGNOSIS — I6340 Cerebral infarction due to embolism of unspecified cerebral artery: Principal | ICD-10-CM | POA: Diagnosis present

## 2015-02-16 DIAGNOSIS — Z79899 Other long term (current) drug therapy: Secondary | ICD-10-CM | POA: Diagnosis not present

## 2015-02-16 DIAGNOSIS — N529 Male erectile dysfunction, unspecified: Secondary | ICD-10-CM | POA: Diagnosis present

## 2015-02-16 DIAGNOSIS — R4781 Slurred speech: Secondary | ICD-10-CM

## 2015-02-16 DIAGNOSIS — Z820 Family history of epilepsy and other diseases of the nervous system: Secondary | ICD-10-CM | POA: Diagnosis not present

## 2015-02-16 DIAGNOSIS — Z825 Family history of asthma and other chronic lower respiratory diseases: Secondary | ICD-10-CM | POA: Diagnosis not present

## 2015-02-16 DIAGNOSIS — J0100 Acute maxillary sinusitis, unspecified: Secondary | ICD-10-CM | POA: Diagnosis present

## 2015-02-16 DIAGNOSIS — I10 Essential (primary) hypertension: Secondary | ICD-10-CM | POA: Diagnosis present

## 2015-02-16 DIAGNOSIS — Z955 Presence of coronary angioplasty implant and graft: Secondary | ICD-10-CM

## 2015-02-16 DIAGNOSIS — I639 Cerebral infarction, unspecified: Secondary | ICD-10-CM

## 2015-02-16 DIAGNOSIS — E785 Hyperlipidemia, unspecified: Secondary | ICD-10-CM | POA: Diagnosis present

## 2015-02-16 DIAGNOSIS — E119 Type 2 diabetes mellitus without complications: Secondary | ICD-10-CM | POA: Diagnosis present

## 2015-02-16 DIAGNOSIS — R200 Anesthesia of skin: Secondary | ICD-10-CM

## 2015-02-16 DIAGNOSIS — Z7982 Long term (current) use of aspirin: Secondary | ICD-10-CM | POA: Diagnosis not present

## 2015-02-16 DIAGNOSIS — E1165 Type 2 diabetes mellitus with hyperglycemia: Secondary | ICD-10-CM | POA: Diagnosis present

## 2015-02-16 DIAGNOSIS — R748 Abnormal levels of other serum enzymes: Secondary | ICD-10-CM | POA: Diagnosis present

## 2015-02-16 DIAGNOSIS — Z8673 Personal history of transient ischemic attack (TIA), and cerebral infarction without residual deficits: Secondary | ICD-10-CM

## 2015-02-16 HISTORY — DX: Acute myocardial infarction, unspecified: I21.9

## 2015-02-16 HISTORY — DX: Cerebral infarction, unspecified: I63.9

## 2015-02-16 LAB — CBC
HCT: 46.6 % (ref 40.0–52.0)
Hemoglobin: 15.6 g/dL (ref 13.0–18.0)
MCH: 31.4 pg (ref 26.0–34.0)
MCHC: 33.6 g/dL (ref 32.0–36.0)
MCV: 93.4 fL (ref 80.0–100.0)
Platelets: 155 10*3/uL (ref 150–440)
RBC: 4.98 MIL/uL (ref 4.40–5.90)
RDW: 14 % (ref 11.5–14.5)
WBC: 8.7 10*3/uL (ref 3.8–10.6)

## 2015-02-16 LAB — GLUCOSE, CAPILLARY: Glucose-Capillary: 231 mg/dL — ABNORMAL HIGH (ref 65–99)

## 2015-02-16 LAB — LIPID PANEL
Cholesterol: 120 mg/dL (ref 0–200)
HDL: 27 mg/dL — ABNORMAL LOW (ref >40)
LDL Cholesterol: 57 mg/dL (ref 0–99)
Total CHOL/HDL Ratio: 4.4 RATIO
Triglycerides: 181 mg/dL — ABNORMAL HIGH (ref <150)
VLDL: 36 mg/dL (ref 0–40)

## 2015-02-16 LAB — COMPREHENSIVE METABOLIC PANEL
ALT: 41 U/L (ref 17–63)
AST: 43 U/L — ABNORMAL HIGH (ref 15–41)
Albumin: 4.1 g/dL (ref 3.5–5.0)
Alkaline Phosphatase: 124 U/L (ref 38–126)
Anion gap: 9 (ref 5–15)
BUN: 14 mg/dL (ref 6–20)
CO2: 24 mmol/L (ref 22–32)
Calcium: 9.2 mg/dL (ref 8.9–10.3)
Chloride: 107 mmol/L (ref 101–111)
Creatinine, Ser: 1.21 mg/dL (ref 0.61–1.24)
GFR calc Af Amer: >60 mL/min (ref >60)
GFR calc non Af Amer: >60 mL/min (ref >60)
Glucose, Bld: 130 mg/dL — ABNORMAL HIGH (ref 65–99)
Potassium: 4.6 mmol/L (ref 3.5–5.1)
Sodium: 140 mmol/L (ref 135–145)
Total Bilirubin: 0.9 mg/dL (ref 0.3–1.2)
Total Protein: 7.3 g/dL (ref 6.5–8.1)

## 2015-02-16 LAB — DIFFERENTIAL
Basophils Absolute: 0 10*3/uL (ref 0–0.1)
Basophils Relative: 1 %
Eosinophils Absolute: 0.2 10*3/uL (ref 0–0.7)
Eosinophils Relative: 3 %
Lymphocytes Relative: 14 %
Lymphs Abs: 1.2 10*3/uL (ref 1.0–3.6)
Monocytes Absolute: 0.9 10*3/uL (ref 0.2–1.0)
Monocytes Relative: 10 %
Neutro Abs: 6.3 10*3/uL (ref 1.4–6.5)
Neutrophils Relative %: 72 %

## 2015-02-16 LAB — TROPONIN I
Troponin I: 0.39 ng/mL — ABNORMAL HIGH (ref <0.031)
Troponin I: 0.32 ng/mL — ABNORMAL HIGH (ref <0.031)

## 2015-02-16 MED ORDER — ASPIRIN 81 MG PO CHEW
324.0000 mg | CHEWABLE_TABLET | Freq: Once | ORAL | Status: AC
  Administered 2015-02-16: 324 mg via ORAL
  Filled 2015-02-16: qty 4

## 2015-02-16 MED ORDER — AMOXICILLIN-POT CLAVULANATE 875-125 MG PO TABS
1.0000 | ORAL_TABLET | Freq: Two times a day (BID) | ORAL | Status: DC
  Administered 2015-02-16 – 2015-02-17 (×2): 1 via ORAL
  Filled 2015-02-16 (×2): qty 1

## 2015-02-16 MED ORDER — INSULIN ASPART 100 UNIT/ML ~~LOC~~ SOLN: 0.0000 [IU] | Freq: Three times a day (TID) | SUBCUTANEOUS | Status: DC

## 2015-02-16 MED ORDER — METFORMIN HCL 850 MG PO TABS
850.0000 mg | ORAL_TABLET | Freq: Two times a day (BID) | ORAL | Status: DC
  Filled 2015-02-16 (×3): qty 1

## 2015-02-16 MED ORDER — CLOPIDOGREL BISULFATE 75 MG PO TABS
75.0000 mg | ORAL_TABLET | Freq: Every day | ORAL | Status: DC
  Administered 2015-02-17: 12:00:00 75 mg via ORAL
  Filled 2015-02-16: qty 1

## 2015-02-16 MED ORDER — INSULIN ASPART 100 UNIT/ML ~~LOC~~ SOLN: 0.0000 [IU] | Freq: Every day | SUBCUTANEOUS | Status: DC

## 2015-02-16 MED ORDER — LINAGLIPTIN 5 MG PO TABS
5.0000 mg | ORAL_TABLET | Freq: Every day | ORAL | Status: DC
  Administered 2015-02-17: 5 mg via ORAL
  Filled 2015-02-16: qty 1

## 2015-02-16 MED ORDER — GLIMEPIRIDE 2 MG PO TABS
4.0000 mg | ORAL_TABLET | Freq: Every day | ORAL | Status: DC
Start: 2015-02-17 — End: 2015-02-17
  Administered 2015-02-17: 12:00:00 4 mg via ORAL
  Filled 2015-02-16: qty 2

## 2015-02-16 MED ORDER — ASPIRIN EC 81 MG PO TBEC
81.0000 mg | DELAYED_RELEASE_TABLET | Freq: Every day | ORAL | Status: DC
  Administered 2015-02-17: 81 mg via ORAL
  Filled 2015-02-16: qty 1

## 2015-02-16 MED ORDER — SODIUM CHLORIDE 0.9 % IJ SOLN
3.0000 mL | Freq: Two times a day (BID) | INTRAMUSCULAR | Status: DC
  Administered 2015-02-16 – 2015-02-17 (×2): 3 mL via INTRAVENOUS

## 2015-02-16 MED ORDER — ATORVASTATIN CALCIUM 20 MG PO TABS
80.0000 mg | ORAL_TABLET | Freq: Every day | ORAL | Status: DC
  Administered 2015-02-16: 80 mg via ORAL
  Filled 2015-02-16: qty 4

## 2015-02-16 NOTE — ED Notes (Signed)
Report given to Iris, RN.

## 2015-02-16 NOTE — ED Notes (Signed)
Tele neurologist on [facility] with EDP at this time.

## 2015-02-16 NOTE — H&P (Addendum)
[place] [facility] [place] NAME: Karl Morgan    MR#:  [id number]  DATE OF BIRTH:  03/21/1961  DATE OF ADMISSION:  02/16/2015  PRIMARY CARE PHYSICIAN: Keith Rake, MD   REQUESTING/REFERRING PHYSICIAN: Archie Balboa  CHIEF COMPLAINT:   Chief Complaint  Patient presents with  . Code Stroke    HISTORY OF PRESENT ILLNESS: Karl Morgan  is a 54 y.o. 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 When he was at work today around 3 PM he suddenly felt numbness and tingling in his arms and he felt uneasy- so he went to his secretary's office and wanted to tell her but he could not speak clearly. Concerned with this they just immediately transferred him to emergency room, code stroke was activated- CT scan of the head showed the questionable artifact on left cerebellum. TeleMedicine neurologic consult was done- he suggested as his symptoms are very minor, and a stroke might be subacute- not a candidate for TPA but to admit for further workup for stroke. During my exam patient says that he still have some fine motor deficit on his left arm, he has very good power we can hold the things but he does not feel he would be able to write with that hand- and he is left-handed. But he denies any other complaints.  PAST MEDICAL HISTORY:   Past Medical History  Diagnosis Date  . Type II or unspecified type diabetes mellitus without mention of complication, uncontrolled 04/04/2011  . Unspecified essential hypertension 04/04/2011  . Hyperlipidemia 04/04/2011  . Hypogonadism male 04/04/2011  . Alopecia 04/04/2011  . Erectile dysfunction 04/04/2011  . Diabetes mellitus   . Substance abuse   . Heart murmur   . Myocardial infarction [facility])     PAST SURGICAL HISTORY:  Past Surgical History  Procedure Laterality Date   . Cardiac catheterization      SOCIAL HISTORY:  Social History  Substance Use Topics  . Smoking status: Never Smoker   . Smokeless tobacco: Never Used  . Alcohol Use: No    FAMILY HISTORY:  Family History  Problem Relation Age of Onset  . Hypertension Father   . Dementia Father   . Cancer Neg Hx   . Diabetes Neg Hx   . Heart disease Neg Hx   . Stroke Neg Hx   . Emphysema Mother     DRUG ALLERGIES:  Allergies  Allergen Reactions  . Pravachol [Pravastatin Sodium] Other (See Comments)    Reaction:  Muscle aches     REVIEW OF SYSTEMS:   CONSTITUTIONAL: No fever, fatigue or weakness.  EYES: No blurred or double vision.  EARS, NOSE, AND THROAT: No tinnitus or ear pain.  RESPIRATORY: No cough, shortness of breath, wheezing or hemoptysis.  CARDIOVASCULAR: No chest pain, orthopnea, edema.  GASTROINTESTINAL: No nausea, vomiting, diarrhea or abdominal pain.  GENITOURINARY: No dysuria, hematuria.  ENDOCRINE: No polyuria, nocturia,  HEMATOLOGY: No anemia, easy bruising or bleeding SKIN: No rash or lesion. MUSCULOSKELETAL: No joint pain or arthritis.   NEUROLOGIC: Left upper extremity tingling, numbness, weakness.  PSYCHIATRY: No anxiety or depression.   MEDICATIONS AT HOME:  Prior to Admission medications   Medication Sig Start Date End Date Taking? Authorizing Provider  Alprostadil, Vasodilator, (EDEX IC) 1 Dose by Intracavernosal route as needed (for erectile dysfunstion). Pt is unsure of the dose.  Yes Historical Provider, MD  amoxicillin-clavulanate (AUGMENTIN) 875-125 MG tablet Take 1 tablet by mouth 2 (two) times daily. 02/14/15  Yes Robyn Haber, MD  aspirin EC 81 MG tablet Take 81 mg by mouth daily.   Yes Historical Provider, MD  atorvastatin (LIPITOR) 80 MG tablet Take 80 mg by mouth at bedtime.   Yes Historical Provider, MD  clopidogrel (PLAVIX) 75 MG tablet Take 75 mg by mouth daily.   Yes Historical Provider, MD  glimepiride (AMARYL) 4 MG tablet Take 4 mg by  mouth daily.   Yes Historical Provider, MD  lisinopril (PRINIVIL,ZESTRIL) 5 MG tablet Take 5 mg by mouth at bedtime.   Yes Historical Provider, MD  metFORMIN (GLUCOPHAGE) 850 MG tablet Take 1 tablet (850 mg total) by mouth 2 (two) times daily with a meal. 01/24/15  Yes Roselee Nova, MD  metoprolol succinate (TOPROL-XL) 25 MG 24 hr tablet Take 25 mg by mouth daily.   Yes Historical Provider, MD  sildenafil (VIAGRA) 100 MG tablet Take 0.5-1 tablets (50-100 mg total) by mouth daily as needed for erectile dysfunction. 11/10/12  Yes Wendie Agreste, MD  sitaGLIPtin (JANUVIA) 100 MG tablet Take 100 mg by mouth daily.   Yes Historical Provider, MD  testosterone cypionate (DEPOTESTOSTERONE CYPIONATE) 200 MG/ML injection Inject 200 mg into the muscle every 7 (seven) days. Pt uses on Saturday.   Yes Historical Provider, MD  valACYclovir (VALTREX) 1000 MG tablet Take 1,000 mg by mouth 2 (two) times daily as needed (for fever blisters).   Yes Historical Provider, MD      PHYSICAL EXAMINATION:   VITAL SIGNS: Blood pressure 162/128, pulse 80, temperature 97.8 F (36.6 C), temperature source Oral, resp. rate 18, SpO2 97 %.  GENERAL:  54 y.o.-year-old patient lying in the bed with no acute distress.  EYES: Pupils equal, round, reactive to light and accommodation. No scleral icterus. Extraocular muscles intact.  HEENT: Head atraumatic, normocephalic. Oropharynx and nasopharynx clear.  NECK:  Supple, no jugular venous distention. No thyroid enlargement, no tenderness.  LUNGS: Normal breath sounds bilaterally, no wheezing, rales,rhonchi or crepitation. No use of accessory muscles of respiration.  CARDIOVASCULAR: S1, S2 normal. No murmurs, rubs, or gallops.  ABDOMEN: Soft, nontender, nondistended. Bowel sounds present. No organomegaly or mass.  EXTREMITIES: No pedal edema, cyanosis, or clubbing.  NEUROLOGIC: Cranial nerves II through XII are intact. Muscle strength 5/5 in all extremities. Sensation intact.  Gait not checked.  PSYCHIATRIC: The patient is alert and oriented x 3.  SKIN: No obvious rash, lesion, or ulcer.   LABORATORY PANEL:   CBC  Recent Labs Lab 02/16/15 1543  WBC 8.7  HGB 15.6  HCT 46.6  PLT 155  MCV 93.4  MCH 31.4  MCHC 33.6  RDW 14.0  LYMPHSABS 1.2  MONOABS 0.9  EOSABS 0.2  BASOSABS 0.0   ------------------------------------------------------------------------------------------------------------------  Chemistries   Recent Labs Lab 02/16/15 1543  NA 140  K 4.6  CL 107  CO2 24  GLUCOSE 130*  BUN 14  CREATININE 1.21  CALCIUM 9.2  AST 43*  ALT 41  ALKPHOS 124  BILITOT 0.9   ------------------------------------------------------------------------------------------------------------------ estimated creatinine clearance is 90.3 mL/min (by C-G formula based on Cr of 1.21). ------------------------------------------------------------------------------------------------------------------ No results for input(s): TSH, T4TOTAL, T3FREE, THYROIDAB in the last 72 hours.  Invalid input(s): FREET3   Coagulation profile No results for input(s): INR, PROTIME in the last 168 hours. ------------------------------------------------------------------------------------------------------------------- No results for input(s): DDIMER in the last 72 hours. -------------------------------------------------------------------------------------------------------------------  Cardiac Enzymes  Recent  Labs Lab 02/16/15 1543  TROPONINI 0.39*   ------------------------------------------------------------------------------------------------------------------ Invalid input(s): POCBNP  ---------------------------------------------------------------------------------------------------------------  Urinalysis    Component Value Date/Time   COLORURINE Straw 06/23/2013 1942   COLORURINE Yellow 12/05/2011 1615   APPEARANCEUR Clear 06/23/2013 1942   APPEARANCEUR CLEAR  12/05/2011 1615   LABSPEC 1.023 06/23/2013 1942   LABSPEC 1.015 12/05/2011 1615   PHURINE 6.0 06/23/2013 1942   PHURINE 6.0 12/05/2011 1615   GLUCOSEU >=500 06/23/2013 1942   GLUCOSEU >=1000 12/05/2011 1615   HGBUR NEGATIVE 12/05/2011 1615   BILIRUBINUR NEGATIVE 12/05/2011 1615   KETONESUR Negative 06/23/2013 1942   KETONESUR NEGATIVE 12/05/2011 1615   PROTEINUR Negative 06/23/2013 1942   UROBILINOGEN 0.2 12/05/2011 1615   NITRITE Negative 06/23/2013 1942   NITRITE NEGATIVE 12/05/2011 1615   LEUKOCYTESUR Negative 06/23/2013 1942   LEUKOCYTESUR NEGATIVE 12/05/2011 1615     RADIOLOGY: Ct Head Wo Contrast  02/16/2015  CLINICAL DATA:  Code stroke, sudden onset of slurred speech and numbness to BILATERAL upper extremities and face, type II diabetes mellitus, unspecified essential hypertension, hyperlipidemia EXAM: CT HEAD WITHOUT CONTRAST TECHNIQUE: Contiguous axial images were obtained from the base of the skull through the vertex without intravenous contrast. COMPARISON:  None FINDINGS: Normal ventricular morphology. No midline shift or mass effect. Low-attenuation at lateral aspect of LEFT cerebellum question infarct versus artifact from beam hardening images 7-8. Otherwise normal appearance of brain parenchyma. No intracranial hemorrhage, mass lesion, or acute infarction. Visualized paranasal sinuses and mastoid air cells clear. Bones unremarkable. IMPRESSION: Questionable artifact at lateral aspect of LEFT cerebellum, cannot exclude infarct. No intracranial hemorrhage or additional focal parenchymal brain abnormalities identified. Findings called to Dr. Jimmye Norman on 02/16/2015 at 1525 hours. Electronically Signed   By: Lavonia Dana M.D.   On: 02/16/2015 15:27    EKG:  Normal sinus rhythm  IMPRESSION AND PLAN:  * Acute stroke  Admit to medical surgical floor. Cardiac monitoring.  He is already on aspirin and Plavix continue that for now.  Check his lipid profile, hemoglobin A1c.   Blood pressure is running around 150-160- so we will hold his hypertensive medications and allow permissive hypertension at this point.  Get an MRI of the brain and MR angiogram on brain, carotid Doppler study and echocardiogram.  Neurology consult for further management.  As he has no significant weakness he does not need a physical therapy but he might need occupational therapy help for his fine motor skills.  * Diabetes  Continue home medications, give insulin sliding scale coverage.  * Hypertension  Currently we'll hold his hypertensive medications and allow permissive hypertension.  * Coronary artery disease status post stent  Continue aspirin and Plavix, continue statins.  * Erectile dysfunction Hold testosterone and sildenafil at this point.  * Sinusitis  He was seen by ENT and started on Augmentin 2 days ago, so will continue that in here Hospital.  * Elevated troponin   It can be high in stroke, No chest pain.    Monitor serial troponin.  All the records are reviewed and case discussed with ED provider. Management plans discussed with the patient, family and they are in agreement.  CODE STATUS: Full    TOTAL TIME TAKING CARE OF THIS PATIENT: 50 minutes.    Vaughan Basta M.D on 02/16/2015   Between 7am to 6pm - Pager - [phone]  After 6pm go to [email or web address] - password [email or web address] [facility]  [facility]  Office  [phone]  CC: Primary care physician; Keith Rake, MD  Note: This dictation was prepared with Dragon dictation along with smaller phrase technology. Any transcriptional errors that result from this process are unintentional.

## 2015-02-16 NOTE — ED Provider Notes (Signed)
[facility] Emergency Department Provider Note   ____________________________________________  Time seen: 1522  I have reviewed the triage vital signs and the nursing notes.   HISTORY  Chief Complaint Code Stroke   History limited by: Not Limited   HPI Karl Morgan is a 54 y.o. male who presents to the emergency department today because of concerns for strokelike symptoms. Patient states that these symptoms started roughly 25 minutes ago. He states he was walking down the hallway when he started feeling dizziness he got into the office and when he tried to speaking noticed he had slurred speech. In addition he had bilateral arm and hand numbness. He denied any headache. He denied any concurrent chest pain or shortness of breath. He currently states that he feels like his speech has completely normalized. His only complaints at the time of my examination was some continual left lower arm numbness and left face altered sensation. He denies similar symptoms in the past. He states he has been getting over a recent cold    Past Medical History  Diagnosis Date  . Type II or unspecified type diabetes mellitus without mention of complication, uncontrolled 04/04/2011  . Unspecified essential hypertension 04/04/2011  . Hyperlipidemia 04/04/2011  . Hypogonadism male 04/04/2011  . Alopecia 04/04/2011  . Erectile dysfunction 04/04/2011  . Diabetes mellitus   . Substance abuse   . Heart murmur     Patient Active Problem List   Diagnosis Date Noted  . Acute pain of left shoulder 10/07/2014  . Arteriosclerosis of coronary artery 10/07/2014  . Heart attack ([place]) 10/07/2014  . Hypotestosteronism 10/07/2014  . B12 deficiency 12/18/2011  . Unspecified hypothyroidism 05/04/2011  . Routine general medical examination at a health care facility 04/05/2011  . Diabetes mellitus type 2, uncontrolled ([place]) 04/04/2011  . Unspecified essential hypertension 04/04/2011  . Hyperlipidemia  04/04/2011  . Hypogonadism male 04/04/2011  . Alopecia 04/04/2011  . Erectile dysfunction 04/04/2011    No past surgical history on file.  Current Outpatient Rx  Name  Route  Sig  Dispense  Refill  . amoxicillin-clavulanate (AUGMENTIN) 875-125 MG tablet   Oral   Take 1 tablet by mouth 2 (two) times daily.   20 tablet   0   . aspirin 81 MG tablet   Oral   Take 81 mg by mouth daily.         Marland Kitchen atorvastatin (LIPITOR) 80 MG tablet      TAKE 1 TABLET BY MOUTH ONCE DAILY   30 tablet   2   . Blood Glucose Monitoring Suppl (CONTOUR NEXT EZ MONITOR) W/DEVICE KIT   Does not apply   1 Act by Does not apply route 2 (two) times daily.   1 kit   0   . Clopidogrel Bisulfate (PLAVIX PO)   Oral   Take by mouth daily.         Marland Kitchen glimepiride (AMARYL) 4 MG tablet      TAKE 1 TABLET BY MOUTH ONCE DAILY   30 tablet   2   . JANUVIA 100 MG tablet      TAKE 1 TABLET BY MOUTH ONCE DAILY   90 tablet   0   . lisinopril (PRINIVIL,ZESTRIL) 5 MG tablet      TAKE 1 TABLET BY MOUTH ONCE DAILY   30 tablet   2   . metFORMIN (GLUCOPHAGE) 850 MG tablet   Oral   Take 1 tablet (850 mg total) by mouth 2 (two) times  daily with a meal.   180 tablet   0   . metoprolol succinate (TOPROL-XL) 25 MG 24 hr tablet      TAKE 1 TABLET BY MOUTH ONCE DAILY   30 tablet   2   . sildenafil (VIAGRA) 100 MG tablet   Oral   Take 0.5-1 tablets (50-100 mg total) by mouth daily as needed for erectile dysfunction.   5 tablet   11   . valACYclovir (VALTREX) 1000 MG tablet      TAKE 2 TABLETS BY MOUTH EVERY 12 HOURS   4 tablet   5   . vitamin B-12 (CYANOCOBALAMIN) 250 MCG tablet      TAKE 1 TABLET (250 MCG TOTAL) BY MOUTH DAILY.   90 tablet   2     Allergies Pravachol  Family History  Problem Relation Age of Onset  . Hypertension Father   . Dementia Father   . Cancer Neg Hx   . Diabetes Neg Hx   . Heart disease Neg Hx   . Stroke Neg Hx   . Emphysema Mother     Social  History Social History  Substance Use Topics  . Smoking status: Never Smoker   . Smokeless tobacco: Never Used  . Alcohol Use: No    Review of Systems  Constitutional: Negative for fever. Cardiovascular: Negative for chest pain. Respiratory: Negative for shortness of breath. Gastrointestinal: Negative for abdominal pain, vomiting and diarrhea. Genitourinary: Negative for dysuria. Musculoskeletal: Negative for back pain. Skin: Negative for rash. Neurological: Slurred speech, left arm numbness, facial numbness  10-point ROS otherwise negative.  ____________________________________________   PHYSICAL EXAM:  VITAL SIGNS: ED Triage Vitals  Enc Vitals Group     BP 02/16/15 1518 184/109 mmHg     Pulse Rate 02/16/15 1518 74     Resp 02/16/15 1518 19     Temp 02/16/15 1518 97.4 F (36.3 C)     Temp Source 02/16/15 1518 Oral     SpO2 02/16/15 1518 100 %     Weight --      Height --      Head Cir --      Peak Flow --      Pain Score 02/16/15 1523 0   Constitutional: Alert and oriented. Well appearing and in no distress. Eyes: Conjunctivae are normal. PERRL. Normal extraocular movements. ENT   Head: Normocephalic and atraumatic.   Nose: No congestion/rhinnorhea.   Mouth/Throat: Mucous membranes are moist.   Neck: No stridor. Hematological/Lymphatic/Immunilogical: No cervical lymphadenopathy. Cardiovascular: Normal rate, regular rhythm.  No murmurs, rubs, or gallops. Respiratory: Normal respiratory effort without tachypnea nor retractions. Breath sounds are clear and equal bilaterally. No wheezes/rales/rhonchi. Gastrointestinal: Soft and nontender. No distention. There is no CVA tenderness. Genitourinary: Deferred Musculoskeletal: Normal range of motion in all extremities. No joint effusions.  No lower extremity tenderness nor edema. Neurologic:  Normal speech and language. Strength 5 out of 5 in upper and lower extremities. No drift. Face symmetric. Tongue  midline. No gross focal neurologic deficits are appreciated. Subjective numbness to the left hand. NIHSS 1 Skin:  Skin is warm, dry and intact. No rash noted. Psychiatric: Mood and affect are normal. Speech and behavior are normal. Patient exhibits appropriate insight and judgment.  ____________________________________________    LABS (pertinent positives/negatives)  Labs Reviewed  COMPREHENSIVE METABOLIC PANEL - Abnormal; Notable for the following:    Glucose, Bld 130 (*)    AST 43 (*)    All other components within normal limits  TROPONIN I - Abnormal; Notable for the following:    Troponin I 0.39 (*)    All other components within normal limits  CBC  DIFFERENTIAL  HEMOGLOBIN A1C  LIPID PANEL  CBG MONITORING, ED     ____________________________________________   EKG  I, Nance Pear, attending physician, personally viewed and interpreted this EKG  EKG Time: 1525 Rate: 77 Rhythm: NSR Axis: normal Intervals: qtc 414 QRS: narrow ST changes: no st elevation Impression: normal ekg ____________________________________________    RADIOLOGY  CT head IMPRESSION: Questionable artifact at lateral aspect of LEFT cerebellum, cannot exclude infarct.  No intracranial hemorrhage or additional focal parenchymal brain abnormalities identified.   ____________________________________________   PROCEDURES  Procedure(s) performed: None  Critical Care performed: Yes, see critical care note(s)  CRITICAL CARE Performed by: Nance Pear   Total critical care time: 35 minutes  Critical care time was exclusive of separately billable procedures and treating other patients.  Critical care was necessary to treat or prevent imminent or life-threatening deterioration.  Critical care was time spent personally by me on the following activities: development of treatment plan with patient and/or surrogate as well as nursing, discussions with consultants, evaluation of  patient's response to treatment, examination of patient, obtaining history from patient or surrogate, ordering and performing treatments and interventions, ordering and review of laboratory studies, ordering and review of radiographic studies, pulse oximetry and re-evaluation of patient's condition. ____________________________________________   INITIAL IMPRESSION / ASSESSMENT AND PLAN / ED COURSE  Pertinent labs & imaging results that were available during my care of the patient were reviewed by me and considered in my medical decision making (see chart for details).  Patient presented to the emergency department today after episode of dizziness, slurred speech and arm numbness. By the time of my examination the patient slurred speech had improved however he still had subjective numbness to his left distal arm and face. No weakness appreciated. Given the concern for possible acute stroke versus TIA head CT was done which did show an area of hypoattenuation in the cerebellum. Furthermore telemetry neurology was consulted. They did evaluate the patient and did not recommend TPA even though the patient was within the time window. I agree with foregoing TPA at this time given rapidly improving symptoms and only finding of some left arm numbness and left face numbness. We will however plan on admission to the hospital for further stroke workup.  ____________________________________________   FINAL CLINICAL IMPRESSION(S) / ED DIAGNOSES  Final diagnoses:  Slurred speech  Hand numbness     Nance Pear, MD 02/16/15 1807

## 2015-02-16 NOTE — ED Notes (Signed)
Pt here from work, reports dizziness followed by slurred speech. Pt reports bilateral facial numbness, numbness and tingling to bilateral arms.

## 2015-02-16 NOTE — ED Notes (Signed)
Report called to H[place], RN.  Pt transferring to floor.

## 2015-02-16 NOTE — ED Notes (Signed)
3333 called. Pt taken to CT by this RN.

## 2015-02-16 NOTE — Progress Notes (Signed)
Because of Suspicion of stroke - We will allow permissive Htn of systolic Up to [id number] and diastolic up to [id number]. Can give Hydralazine 10 mg Inj- if BP is more than that.

## 2015-02-17 ENCOUNTER — Inpatient Hospital Stay
Admit: 2015-02-17 | Discharge: 2015-02-17 | Disposition: A | Discharge status: Home / Self Care | Payer: 59 | Attending: Internal Medicine | Admitting: Internal Medicine

## 2015-02-17 ENCOUNTER — Inpatient Hospital Stay: Payer: 59

## 2015-02-17 LAB — TROPONIN I: Troponin I: 0.32 ng/mL — ABNORMAL HIGH (ref <0.031)

## 2015-02-17 LAB — BASIC METABOLIC PANEL
Anion gap: 3 — ABNORMAL LOW (ref 5–15)
BUN: 11 mg/dL (ref 6–20)
CO2: 26 mmol/L (ref 22–32)
Calcium: 9 mg/dL (ref 8.9–10.3)
Chloride: 111 mmol/L (ref 101–111)
Creatinine, Ser: 0.94 mg/dL (ref 0.61–1.24)
GFR calc Af Amer: >60 mL/min (ref >60)
GFR calc non Af Amer: >60 mL/min (ref >60)
Glucose, Bld: 138 mg/dL — ABNORMAL HIGH (ref 65–99)
Potassium: 4 mmol/L (ref 3.5–5.1)
Sodium: 140 mmol/L (ref 135–145)

## 2015-02-17 LAB — CBC
HCT: 44.2 % (ref 40.0–52.0)
Hemoglobin: 15.1 g/dL (ref 13.0–18.0)
MCH: 31.6 pg (ref 26.0–34.0)
MCHC: 34.1 g/dL (ref 32.0–36.0)
MCV: 92.7 fL (ref 80.0–100.0)
Platelets: 140 10*3/uL — ABNORMAL LOW (ref 150–440)
RBC: 4.77 MIL/uL (ref 4.40–5.90)
RDW: 14.1 % (ref 11.5–14.5)
WBC: 7.6 10*3/uL (ref 3.8–10.6)

## 2015-02-17 LAB — GLUCOSE, CAPILLARY
Glucose-Capillary: 129 mg/dL — ABNORMAL HIGH (ref 65–99)
Glucose-Capillary: 196 mg/dL — ABNORMAL HIGH (ref 65–99)

## 2015-02-17 LAB — SEDIMENTATION RATE: Sed Rate: 5 mm/hr (ref 0–20)

## 2015-02-17 LAB — HEMOGLOBIN A1C: Hgb A1c MFr Bld: 7.2 % — ABNORMAL HIGH (ref 4.0–6.0)

## 2015-02-17 LAB — VITAMIN B12: Vitamin B-12: 266 pg/mL (ref 180–914)

## 2015-02-17 MED ORDER — GADOBENATE DIMEGLUMINE 529 MG/ML IV SOLN
20.0000 mL | Freq: Once | INTRAVENOUS | Status: AC | PRN
  Administered 2015-02-17: 20 mL via INTRAVENOUS

## 2015-02-17 NOTE — Discharge Summary (Signed)
[facility] NAME: Karl Morgan    MR#:  [id number]  DATE OF BIRTH:  06-18-1960  DATE OF ADMISSION:  02/16/2015 ADMITTING PHYSICIAN: Vaughan Basta, MD  DATE OF DISCHARGE: 02/17/15  PRIMARY CARE PHYSICIAN: Keith Rake, MD    ADMISSION DIAGNOSIS:  Slurred speech [R47.81] CVA (cerebral infarction) [I63.9] Hand numbness [R20.8]  DISCHARGE DIAGNOSIS:  Principal Problem:   Stroke (cerebrum) ([place])   SECONDARY DIAGNOSIS:   Past Medical History  Diagnosis Date  . Type II or unspecified type diabetes mellitus without mention of complication, uncontrolled 04/04/2011  . Unspecified essential hypertension 04/04/2011  . Hyperlipidemia 04/04/2011  . Hypogonadism male 04/04/2011  . Alopecia 04/04/2011  . Erectile dysfunction 04/04/2011  . Diabetes mellitus   . Substance abuse   . Heart murmur   . Myocardial infarction ([place])   . Stroke [facility])     HOSPITAL COURSE:   54 year old male with past medical history significant for diabetes, hypertension, hyperlipidemia, erectile dysfunction presents to the hospital secondary to changes in speech and also left-sided tingling and numbness.  #1 acute CVA-infarct in the posterior right MCA territory. Neurological symptoms have resolved almost completely except minimal tingling cough left hand medial 3 fingers. -Patient already on aspirin and Plavix. He might have come off of Plavix recently for colonoscopy procedure. -Could have been an embolic infarct. MRA with no occlusions -Echocardiogram for cardiac emboli done but pending at this time. That will need outpatient follow-up for TEE and may be a loop recorder at this time. -Continue his aspirin and Plavix and statin. -Appreciate neurology consult. Outpatient physical therapy and occupational therapy recommended however patient refused at this time.  #2 hypertension-blood pressure medicines held yesterday to improve perfusion - restart  metoprolol and lisinopril at discharge  #3 DM- hba1c of 7.2 - cont metformin, januvia, amaryl as well  #4 Erectile dysfunction-continue outpatient medications.  Patient will be discharged today.  DISCHARGE CONDITIONS:   Stable  CONSULTS OBTAINED:  Treatment Team:  Valora Corporal, MD  DRUG ALLERGIES:   Allergies  Allergen Reactions  . Pravachol [Pravastatin Sodium] Other (See Comments)    Reaction:  Muscle aches     DISCHARGE MEDICATIONS:   Current Discharge Medication List    CONTINUE these medications which have NOT CHANGED   Details  Alprostadil, Vasodilator, (EDEX IC) 1 Dose by Intracavernosal route as needed (for erectile dysfunstion). Pt is unsure of the dose.    amoxicillin-clavulanate (AUGMENTIN) 875-125 MG tablet Take 1 tablet by mouth 2 (two) times daily. Qty: 20 tablet, Refills: 0   Associated Diagnoses: Acute maxillary sinusitis, recurrence not specified    aspirin EC 81 MG tablet Take 81 mg by mouth daily.    atorvastatin (LIPITOR) 80 MG tablet Take 80 mg by mouth at bedtime.    clopidogrel (PLAVIX) 75 MG tablet Take 75 mg by mouth daily.    glimepiride (AMARYL) 4 MG tablet Take 4 mg by mouth daily.    lisinopril (PRINIVIL,ZESTRIL) 5 MG tablet Take 5 mg by mouth at bedtime.    metFORMIN (GLUCOPHAGE) 850 MG tablet Take 1 tablet (850 mg total) by mouth 2 (two) times daily with a meal. Qty: 180 tablet, Refills: 0   Associated Diagnoses: Type 2 diabetes mellitus without complication, without long-term current use of insulin (HCC)    metoprolol succinate (TOPROL-XL) 25 MG 24 hr tablet Take 25 mg by mouth daily.    sildenafil (VIAGRA) 100 MG tablet Take 0.5-1 tablets (50-100 mg total) by  mouth daily as needed for erectile dysfunction. Qty: 5 tablet, Refills: 11   Associated Diagnoses: Erectile dysfunction    sitaGLIPtin (JANUVIA) 100 MG tablet Take 100 mg by mouth daily.    testosterone cypionate (DEPOTESTOSTERONE CYPIONATE) 200 MG/ML injection Inject  200 mg into the muscle every 7 (seven) days. Pt uses on Saturday.    valACYclovir (VALTREX) 1000 MG tablet Take 1,000 mg by mouth 2 (two) times daily as needed (for fever blisters).         DISCHARGE INSTRUCTIONS:   1. PCP f/u in 1-2 weeks 2. F/u with cardiology in 2-3 weeks for possible loop monitor or TEE to find cause of embolic stroke  If you experience worsening of your admission symptoms, develop shortness of breath, life threatening emergency, suicidal or homicidal thoughts you must seek medical attention immediately by calling 911 or calling your MD immediately  if symptoms less severe.  You Must read complete instructions/literature along with all the possible adverse reactions/side effects for all the Medicines you take and that have been prescribed to you. Take any new Medicines after you have completely understood and accept all the possible adverse reactions/side effects.   Please note  You were cared for by a hospitalist during your hospital stay. If you have any questions about your discharge medications or the care you received while you were in the hospital after you are discharged, you can call the unit and asked to speak with the hospitalist on call if the hospitalist that took care of you is not available. Once you are discharged, your primary care physician will handle any further medical issues. Please note that NO REFILLS for any discharge medications will be authorized once you are discharged, as it is imperative that you return to your primary care physician (or establish a relationship with a primary care physician if you do not have one) for your aftercare needs so that they can reassess your need for medications and monitor your lab values.    Today   CHIEF COMPLAINT:   Chief Complaint  Patient presents with  . Code Stroke    VITAL SIGNS:  Blood pressure 189/107, pulse 73, temperature 97.7 F (36.5 C), temperature source Oral, resp. rate 19, height 6'  (1.829 m), weight 110.542 kg (243 lb 11.2 oz), SpO2 100 %.  I/O:   Intake/Output Summary (Last 24 hours) at 02/17/15 1306 Last data filed at 02/17/15 1009  Gross per 24 hour  Intake    120 ml  Output    850 ml  Net   -730 ml    PHYSICAL EXAMINATION:   Physical Exam  GENERAL:  54 y.o.-year-old patient lying in the bed with no acute distress.  EYES: Pupils equal, round, reactive to light and accommodation. No scleral icterus. Extraocular muscles intact.  HEENT: Head atraumatic, normocephalic. Oropharynx and nasopharynx clear.  NECK:  Supple, no jugular venous distention. No thyroid enlargement, no tenderness.  LUNGS: Normal breath sounds bilaterally, no wheezing, rales,rhonchi or crepitation. No use of accessory muscles of respiration.  CARDIOVASCULAR: S1, S2 normal. No murmurs, rubs, or gallops.  ABDOMEN: Soft, non-tender, non-distended. Bowel sounds present. No organomegaly or mass.  EXTREMITIES: No pedal edema, cyanosis, or clubbing.  NEUROLOGIC: Patient is left handed. Cranial nerves II through XII are intact. Muscle strength 5/5 in all extremities. Sensation intact except some tingling of medial 3 fingers of left hand. Gait not checked.  PSYCHIATRIC: The patient is alert and oriented x 3.  SKIN: No obvious rash, lesion, or ulcer.  DATA REVIEW:   CBC  Recent Labs Lab 02/17/15 0536  WBC 7.6  HGB 15.1  HCT 44.2  PLT 140*    Chemistries   Recent Labs Lab 02/16/15 1543 02/17/15 0536  NA 140 140  K 4.6 4.0  CL 107 111  CO2 24 26  GLUCOSE 130* 138*  BUN 14 11  CREATININE 1.21 0.94  CALCIUM 9.2 9.0  AST 43*  --   ALT 41  --   ALKPHOS 124  --   BILITOT 0.9  --     Cardiac Enzymes  Recent Labs Lab 02/17/15 0536  TROPONINI 0.32*    Microbiology Results  Results for orders placed or performed in visit on 08/13/11  Wound culture     Status: None   Collection Time: 08/13/11 10:01 AM  Result Value Ref Range Status   Gram Stain No WBC Seen  Final   Gram  Stain No Squamous Epithelial Cells Seen  Final   Gram Stain No Organisms Seen  Final   Organism ID, Bacteria Multiple Organisms Present,None Predominant  Final    Comment: No Staphylococcus aureus isolated NO GROUP A STREP (S. PYOGENES) ISOLATED    RADIOLOGY:  Ct Head Wo Contrast  02/16/2015  CLINICAL DATA:  Code stroke, sudden onset of slurred speech and numbness to BILATERAL upper extremities and face, type II diabetes mellitus, unspecified essential hypertension, hyperlipidemia EXAM: CT HEAD WITHOUT CONTRAST TECHNIQUE: Contiguous axial images were obtained from the base of the skull through the vertex without intravenous contrast. COMPARISON:  None FINDINGS: Normal ventricular morphology. No midline shift or mass effect. Low-attenuation at lateral aspect of LEFT cerebellum question infarct versus artifact from beam hardening images 7-8. Otherwise normal appearance of brain parenchyma. No intracranial hemorrhage, mass lesion, or acute infarction. Visualized paranasal sinuses and mastoid air cells clear. Bones unremarkable. IMPRESSION: Questionable artifact at lateral aspect of LEFT cerebellum, cannot exclude infarct. No intracranial hemorrhage or additional focal parenchymal brain abnormalities identified. Findings called to Dr. Jimmye Norman on 02/16/2015 at 1525 hours. Electronically Signed   By: Lavonia Dana M.D.   On: 02/16/2015 15:27   Mr Virgel Paling Wo Contrast  02/17/2015  CLINICAL DATA:  54 year old male with episode of abnormal speech and lightheadedness yesterday with left hand numbness. Symptom improvement, mild residual left hand numbness. Initial encounter. EXAM: MRI HEAD WITHOUT CONTRAST MRA HEAD WITHOUT CONTRAST MRA NECK WITHOUT AND WITH CONTRAST TECHNIQUE: Multiplanar, multiecho pulse sequences of the brain and surrounding structures were obtained without intravenous contrast. Angiographic images of the Circle of Willis were obtained using MRA technique without intravenous contrast.  Angiographic images of the neck were obtained using MRA technique without and with intravenous contrast. Carotid stenosis measurements (when applicable) are obtained utilizing NASCET criteria, using the distal internal carotid diameter as the denominator. CONTRAST:  44mL MULTIHANCE GADOBENATE DIMEGLUMINE 529 MG/ML IV SOLN COMPARISON:  Head CT without contrast 02/16/2015. FINDINGS: MRI HEAD FINDINGS Small area of cortically based restricted diffusion in the superior right motor strip (series 100, image 44). Associated T2 and FLAIR hyperintensity without hemorrhage or mass effect. No contralateral restricted diffusion. Major intracranial vascular flow voids are preserved with dominant appearing distal left vertebral artery. The hypodensity in the left cerebellum seen yesterday by CT corresponds to a small area of chronic cerebellar encephalomalacia (series 11, image 14). No definite associated chronic blood products. Aside from this an the acute findings gray and white matter signal is within normal limits throughout the brain. No midline shift, mass effect, evidence of mass lesion,  ventriculomegaly, extra-axial collection or acute intracranial hemorrhage. Cervicomedullary junction and pituitary are within normal limits. Negative visualized cervical spine. Normal bone marrow signal. Visible internal auditory structures appear normal. Right mastoid effusion. Negative nasopharynx. Normal stylomastoid foramina. Mild paranasal sinus mucosal thickening. Negative orbit and scalp soft tissues. MRA NECK FINDINGS Precontrast time-of-flight images demonstrate antegrade flow in both carotid and vertebral arteries in the neck, however, the right vertebral artery is very diminutive. There is some irregularity at both carotid bifurcations. Post-contrast MRA images reveal a 3 vessel arch configuration. No great vessel origin stenosis. Mildly tortuous right CCA. No right CCA stenosis. At the right carotid bifurcation the right ICA  origin and bulb are irregular, but there is no hemodynamically significant stenosis. Mild cervical right ICA tortuosity. Mild tortuosity and irregularity proximal left CCA without hemodynamically significant stenosis. Widely patent left carotid bifurcation, left ICA origin, and bulb. Mild cervical left ICA tortuosity. No proximal subclavian artery stenosis. The dominant left vertebral artery has a normal origin and is without stenosis to the posterior fossa. The non dominant right vertebral artery is diminutive. Its origin is poorly visible. It might be primarily formed by thyrocervical branches. A remains patent throughout the neck to the skullbase. MRA HEAD FINDINGS Antegrade flow in the posterior circulation which appears supplied by the dominant left vertebral artery. The distal left vertebral artery is better demonstrated on the post-contrast neck MRA images above. No distal left vertebral artery stenosis. Normal left PICA origin. The distal right vertebral artery is diminutive and poorly visualized. On the basis of the above MRA, I suspected terminates in the PICA. No basilar artery stenosis. SCA and PCA origins are within normal limits, however, the left PCA is "Duplicated" . The superior left PCA division is supplied from the left posterior communicating artery, the low inferior division is supplied from the basilar. The right posterior communicating artery is normal. Right PCA branches are within normal limits. Antegrade flow in both ICA siphons. No siphon stenosis. Ophthalmic and posterior communicating artery origins are normal. Patent carotid termini. MCA and ACA origins are normal. Anterior communicating artery and visualized bilateral ACA branches are within normal limits. Visualized left MCA branches are within normal limits. Right MCA origin, M1 segment, bifurcation, and visualized right MCA branches are within normal limits. IMPRESSION: 1. Small acute infarcts in the posterior right MCA territory  affecting the motor strip. No hemorrhage or mass effect. 2. Small chronic left cerebellar infarct, corresponding to the head CT finding yesterday. 3. No emergent large vessel occlusion or stenosis. No major circle of Willis branch stenosis or occlusion. Atherosclerosis primarily at the right carotid bifurcation. 4. Normal variation of the posterior circulation anatomy including diminutive right vertebral artery and "duplicated" left PCA. Electronically Signed   By: Genevie Ann M.D.   On: 02/17/2015 11:15   Mr Angiogram Neck W Wo Contrast  02/17/2015  CLINICAL DATA:  54 year old male with episode of abnormal speech and lightheadedness yesterday with left hand numbness. Symptom improvement, mild residual left hand numbness. Initial encounter. EXAM: MRI HEAD WITHOUT CONTRAST MRA HEAD WITHOUT CONTRAST MRA NECK WITHOUT AND WITH CONTRAST TECHNIQUE: Multiplanar, multiecho pulse sequences of the brain and surrounding structures were obtained without intravenous contrast. Angiographic images of the Circle of Willis were obtained using MRA technique without intravenous contrast. Angiographic images of the neck were obtained using MRA technique without and with intravenous contrast. Carotid stenosis measurements (when applicable) are obtained utilizing NASCET criteria, using the distal internal carotid diameter as the denominator. CONTRAST:  78mL MULTIHANCE  GADOBENATE DIMEGLUMINE 529 MG/ML IV SOLN COMPARISON:  Head CT without contrast 02/16/2015. FINDINGS: MRI HEAD FINDINGS Small area of cortically based restricted diffusion in the superior right motor strip (series 100, image 44). Associated T2 and FLAIR hyperintensity without hemorrhage or mass effect. No contralateral restricted diffusion. Major intracranial vascular flow voids are preserved with dominant appearing distal left vertebral artery. The hypodensity in the left cerebellum seen yesterday by CT corresponds to a small area of chronic cerebellar encephalomalacia  (series 11, image 14). No definite associated chronic blood products. Aside from this an the acute findings gray and white matter signal is within normal limits throughout the brain. No midline shift, mass effect, evidence of mass lesion, ventriculomegaly, extra-axial collection or acute intracranial hemorrhage. Cervicomedullary junction and pituitary are within normal limits. Negative visualized cervical spine. Normal bone marrow signal. Visible internal auditory structures appear normal. Right mastoid effusion. Negative nasopharynx. Normal stylomastoid foramina. Mild paranasal sinus mucosal thickening. Negative orbit and scalp soft tissues. MRA NECK FINDINGS Precontrast time-of-flight images demonstrate antegrade flow in both carotid and vertebral arteries in the neck, however, the right vertebral artery is very diminutive. There is some irregularity at both carotid bifurcations. Post-contrast MRA images reveal a 3 vessel arch configuration. No great vessel origin stenosis. Mildly tortuous right CCA. No right CCA stenosis. At the right carotid bifurcation the right ICA origin and bulb are irregular, but there is no hemodynamically significant stenosis. Mild cervical right ICA tortuosity. Mild tortuosity and irregularity proximal left CCA without hemodynamically significant stenosis. Widely patent left carotid bifurcation, left ICA origin, and bulb. Mild cervical left ICA tortuosity. No proximal subclavian artery stenosis. The dominant left vertebral artery has a normal origin and is without stenosis to the posterior fossa. The non dominant right vertebral artery is diminutive. Its origin is poorly visible. It might be primarily formed by thyrocervical branches. A remains patent throughout the neck to the skullbase. MRA HEAD FINDINGS Antegrade flow in the posterior circulation which appears supplied by the dominant left vertebral artery. The distal left vertebral artery is better demonstrated on the post-contrast  neck MRA images above. No distal left vertebral artery stenosis. Normal left PICA origin. The distal right vertebral artery is diminutive and poorly visualized. On the basis of the above MRA, I suspected terminates in the PICA. No basilar artery stenosis. SCA and PCA origins are within normal limits, however, the left PCA is "Duplicated" . The superior left PCA division is supplied from the left posterior communicating artery, the low inferior division is supplied from the basilar. The right posterior communicating artery is normal. Right PCA branches are within normal limits. Antegrade flow in both ICA siphons. No siphon stenosis. Ophthalmic and posterior communicating artery origins are normal. Patent carotid termini. MCA and ACA origins are normal. Anterior communicating artery and visualized bilateral ACA branches are within normal limits. Visualized left MCA branches are within normal limits. Right MCA origin, M1 segment, bifurcation, and visualized right MCA branches are within normal limits. IMPRESSION: 1. Small acute infarcts in the posterior right MCA territory affecting the motor strip. No hemorrhage or mass effect. 2. Small chronic left cerebellar infarct, corresponding to the head CT finding yesterday. 3. No emergent large vessel occlusion or stenosis. No major circle of Willis branch stenosis or occlusion. Atherosclerosis primarily at the right carotid bifurcation. 4. Normal variation of the posterior circulation anatomy including diminutive right vertebral artery and "duplicated" left PCA. Electronically Signed   By: Genevie Ann M.D.   On: 02/17/2015 11:15  Mr Brain Wo Contrast  02/17/2015  CLINICAL DATA:  54 year old male with episode of abnormal speech and lightheadedness yesterday with left hand numbness. Symptom improvement, mild residual left hand numbness. Initial encounter. EXAM: MRI HEAD WITHOUT CONTRAST MRA HEAD WITHOUT CONTRAST MRA NECK WITHOUT AND WITH CONTRAST TECHNIQUE: Multiplanar,  multiecho pulse sequences of the brain and surrounding structures were obtained without intravenous contrast. Angiographic images of the Circle of Willis were obtained using MRA technique without intravenous contrast. Angiographic images of the neck were obtained using MRA technique without and with intravenous contrast. Carotid stenosis measurements (when applicable) are obtained utilizing NASCET criteria, using the distal internal carotid diameter as the denominator. CONTRAST:  65mL MULTIHANCE GADOBENATE DIMEGLUMINE 529 MG/ML IV SOLN COMPARISON:  Head CT without contrast 02/16/2015. FINDINGS: MRI HEAD FINDINGS Small area of cortically based restricted diffusion in the superior right motor strip (series 100, image 44). Associated T2 and FLAIR hyperintensity without hemorrhage or mass effect. No contralateral restricted diffusion. Major intracranial vascular flow voids are preserved with dominant appearing distal left vertebral artery. The hypodensity in the left cerebellum seen yesterday by CT corresponds to a small area of chronic cerebellar encephalomalacia (series 11, image 14). No definite associated chronic blood products. Aside from this an the acute findings gray and white matter signal is within normal limits throughout the brain. No midline shift, mass effect, evidence of mass lesion, ventriculomegaly, extra-axial collection or acute intracranial hemorrhage. Cervicomedullary junction and pituitary are within normal limits. Negative visualized cervical spine. Normal bone marrow signal. Visible internal auditory structures appear normal. Right mastoid effusion. Negative nasopharynx. Normal stylomastoid foramina. Mild paranasal sinus mucosal thickening. Negative orbit and scalp soft tissues. MRA NECK FINDINGS Precontrast time-of-flight images demonstrate antegrade flow in both carotid and vertebral arteries in the neck, however, the right vertebral artery is very diminutive. There is some irregularity at both  carotid bifurcations. Post-contrast MRA images reveal a 3 vessel arch configuration. No great vessel origin stenosis. Mildly tortuous right CCA. No right CCA stenosis. At the right carotid bifurcation the right ICA origin and bulb are irregular, but there is no hemodynamically significant stenosis. Mild cervical right ICA tortuosity. Mild tortuosity and irregularity proximal left CCA without hemodynamically significant stenosis. Widely patent left carotid bifurcation, left ICA origin, and bulb. Mild cervical left ICA tortuosity. No proximal subclavian artery stenosis. The dominant left vertebral artery has a normal origin and is without stenosis to the posterior fossa. The non dominant right vertebral artery is diminutive. Its origin is poorly visible. It might be primarily formed by thyrocervical branches. A remains patent throughout the neck to the skullbase. MRA HEAD FINDINGS Antegrade flow in the posterior circulation which appears supplied by the dominant left vertebral artery. The distal left vertebral artery is better demonstrated on the post-contrast neck MRA images above. No distal left vertebral artery stenosis. Normal left PICA origin. The distal right vertebral artery is diminutive and poorly visualized. On the basis of the above MRA, I suspected terminates in the PICA. No basilar artery stenosis. SCA and PCA origins are within normal limits, however, the left PCA is "Duplicated" . The superior left PCA division is supplied from the left posterior communicating artery, the low inferior division is supplied from the basilar. The right posterior communicating artery is normal. Right PCA branches are within normal limits. Antegrade flow in both ICA siphons. No siphon stenosis. Ophthalmic and posterior communicating artery origins are normal. Patent carotid termini. MCA and ACA origins are normal. Anterior communicating artery and visualized bilateral ACA branches  are within normal limits. Visualized left  MCA branches are within normal limits. Right MCA origin, M1 segment, bifurcation, and visualized right MCA branches are within normal limits. IMPRESSION: 1. Small acute infarcts in the posterior right MCA territory affecting the motor strip. No hemorrhage or mass effect. 2. Small chronic left cerebellar infarct, corresponding to the head CT finding yesterday. 3. No emergent large vessel occlusion or stenosis. No major circle of Willis branch stenosis or occlusion. Atherosclerosis primarily at the right carotid bifurcation. 4. Normal variation of the posterior circulation anatomy including diminutive right vertebral artery and "duplicated" left PCA. Electronically Signed   By: Genevie Ann M.D.   On: 02/17/2015 11:15    EKG:   Orders placed or performed during the hospital encounter of 02/16/15  . ED EKG  . ED EKG  . EKG 12-Lead  . EKG 12-Lead      Management plans discussed with the patient, family and they are in agreement.  CODE STATUS:     Code Status Orders        Start     Ordered   02/16/15 2005  Full code   Continuous     02/16/15 2004      TOTAL TIME TAKING CARE OF THIS PATIENT: 37 minutes.    Gladstone Lighter M.D on 02/17/2015 at 1:06 PM  Between 7am to 6pm - Pager - [phone]  After 6pm go to [email or web address] - password [email or web address] [facility]  [facility]  Office  [phone]  CC: Primary care physician; Keith Rake, MD

## 2015-02-17 NOTE — Progress Notes (Signed)
MD order received to discharge pt home today; verbally reviewed CHL's AVS with pt including medications/no new Rxs; diet (low sodium/heart health/carb mod); increase activity slowly; pt to call Dr Manuella Ghazi and Dr Etta Quill offices on 02/18/15 to schedule appointments due to offices being closed at present; no questions voiced at this time; pt discharged via wheelchair by volunteer to the visitor's entrance

## 2015-02-17 NOTE — Progress Notes (Signed)
Inpatient Diabetes Program Recommendations  AACE/ADA: New Consensus Statement on Inpatient Glycemic Control (2015)  Target Ranges:  Prepandial:   less than 140 mg/dL      Peak postprandial:   less than 180 mg/dL (1-2 hours)      Critically ill patients:  140 - 180 mg/dL    Inpatient Diabetes Program : Spoke with the patient and his wife at the bedside- discussed the benefits of insulin and the use of insulin in the hospital per ADA guidelines. He was concerned why were not giving him Metformin and Glimiperide while he was inpatient but verbalized understanding once I discussed this with him.  Discussed the Link to Wellness program and the benefit offered through [facility]- gave him my Link to Wellness number if he should have questions. He is not interested in the Link to Wellness program because "Dr. Manuella Ghazi has me well controlled".  I have also given him the [facility] 24 hour nurse line if he has questions.   Gentry Fitz, RN, BA, MHA, CDE Diabetes Coordinator Inpatient Diabetes Program  [phone] (Team Pager) [phone] ([place]) 02/17/2015 1:55 PM

## 2015-02-17 NOTE — Progress Notes (Signed)
Patient to special procedures for echo.

## 2015-02-17 NOTE — Consult Note (Addendum)
Reason for Consult: stroke Referring Physician: Dr. Kalisetti  Karl Morgan is an 54 y.o. male.  HPI: 54 yo LHD M presents to [facility] from work due to sudden onset of facial numbness, headache and difficulty with words.  This lasted only like 5 minutes then pt developed some L arm weakness and difficulty walking.  Pt denies further headache and states that the L sided weakness last for about 5 hours and then improved but he is not quite back to baseline yet  Past Medical History  Diagnosis Date  . Type II or unspecified type diabetes mellitus without mention of complication, uncontrolled 04/04/2011  . Unspecified essential hypertension 04/04/2011  . Hyperlipidemia 04/04/2011  . Hypogonadism male 04/04/2011  . Alopecia 04/04/2011  . Erectile dysfunction 04/04/2011  . Diabetes mellitus   . Substance abuse   . Heart murmur   . Myocardial infarction ([place])   . Stroke [facility])     Past Surgical History  Procedure Laterality Date  . Cardiac catheterization      Family History  Problem Relation Age of Onset  . Hypertension Father   . Dementia Father   . Cancer Neg Hx   . Diabetes Neg Hx   . Heart disease Neg Hx   . Stroke Neg Hx   . Emphysema Mother     Social History:  reports that he has never smoked. He has never used smokeless tobacco. He reports that he does not drink alcohol or use illicit drugs.  Allergies:  Allergies  Allergen Reactions  . Pravachol [Pravastatin Sodium] Other (See Comments)    Reaction:  Muscle aches     Medications: personally reviewed by me   Results for orders placed or performed during the hospital encounter of 02/16/15 (from the past 48 hour(s))  CBC     Status: None   Collection Time: 02/16/15  3:43 PM  Result Value Ref Range   WBC 8.7 3.8 - 10.6 K/uL   RBC 4.98 4.40 - 5.90 MIL/uL   Hemoglobin 15.6 13.0 - 18.0 g/dL   HCT 46.6 40.0 - 52.0 %   MCV 93.4 80.0 - 100.0 fL   MCH 31.4 26.0 - 34.0 pg   MCHC 33.6 32.0 - 36.0 g/dL   RDW 14.0 11.5 - 14.5 %   Platelets 155 150 - 440 K/uL  Differential     Status: None   Collection Time: 02/16/15  3:43 PM  Result Value Ref Range   Neutrophils Relative % 72 %   Neutro Abs 6.3 1.4 - 6.5 K/uL   Lymphocytes Relative 14 %   Lymphs Abs 1.2 1.0 - 3.6 K/uL   Monocytes Relative 10 %   Monocytes Absolute 0.9 0.2 - 1.0 K/uL   Eosinophils Relative 3 %   Eosinophils Absolute 0.2 0 - 0.7 K/uL   Basophils Relative 1 %   Basophils Absolute 0.0 0 - 0.1 K/uL  Comprehensive metabolic panel     Status: Abnormal   Collection Time: 02/16/15  3:43 PM  Result Value Ref Range   Sodium 140 135 - 145 mmol/L   Potassium 4.6 3.5 - 5.1 mmol/L    Comment: HEMOLYSIS AT THIS LEVEL MAY AFFECT RESULT   Chloride 107 101 - 111 mmol/L   CO2 24 22 - 32 mmol/L   Glucose, Bld 130 (H) 65 - 99 mg/dL   BUN 14 6 - 20 mg/dL   Creatinine, Ser 1.21 0.61 - 1.24 mg/dL   Calcium 9.2 8.9 - 10.3 mg/dL   Total Protein  7.3 6.5 - 8.1 g/dL   Albumin 4.1 3.5 - 5.0 g/dL   AST 43 (H) 15 - 41 U/L   ALT 41 17 - 63 U/L   Alkaline Phosphatase 124 38 - 126 U/L   Total Bilirubin 0.9 0.3 - 1.2 mg/dL   GFR calc non Af Amer >60 >60 mL/min   GFR calc Af Amer >60 >60 mL/min    Comment: (NOTE) The eGFR has been calculated using the CKD EPI equation. This calculation has not been validated in all clinical situations. eGFR's persistently <60 mL/min signify possible Chronic Kidney Disease.    Anion gap 9 5 - 15  Troponin I     Status: Abnormal   Collection Time: 02/16/15  3:43 PM  Result Value Ref Range   Troponin I 0.39 (H) <0.031 ng/mL    Comment: RESULTS PREVIOUSLY CALLED MEGAN JONES ON 02/16/15 AT 1645PM BY TB.        PERSISTENTLY INCREASED TROPONIN VALUES IN THE RANGE OF 0.04-0.49 ng/mL CAN BE SEEN IN:       -UNSTABLE ANGINA       -CONGESTIVE HEART FAILURE       -MYOCARDITIS       -CHEST TRAUMA       -ARRYHTHMIAS       -LATE PRESENTING MYOCARDIAL INFARCTION       -COPD   CLINICAL FOLLOW-UP RECOMMENDED.   Hemoglobin A1c     Status:  Abnormal   Collection Time: 02/16/15  3:43 PM  Result Value Ref Range   Hgb A1c MFr Bld 7.2 (H) 4.0 - 6.0 %  Lipid panel     Status: Abnormal   Collection Time: 02/16/15  3:43 PM  Result Value Ref Range   Cholesterol 120 0 - 200 mg/dL   Triglycerides 181 (H) <150 mg/dL   HDL 27 (L) >40 mg/dL   Total CHOL/HDL Ratio 4.4 RATIO   VLDL 36 0 - 40 mg/dL   LDL Cholesterol 57 0 - 99 mg/dL    Comment:        Total Cholesterol/HDL:CHD Risk Coronary Heart Disease Risk Table                     Men   Women  1/2 Average Risk   3.4   3.3  Average Risk       5.0   4.4  2 X Average Risk   9.6   7.1  3 X Average Risk  23.4   11.0        Use the calculated Patient Ratio above and the CHD Risk Table to determine the patient's CHD Risk.        ATP III CLASSIFICATION (LDL):  <100     mg/dL   Optimal  100-129  mg/dL   Near or Above                    Optimal  130-159  mg/dL   Borderline  160-189  mg/dL   High  >190     mg/dL   Very High   Troponin I     Status: Abnormal   Collection Time: 02/16/15  9:31 PM  Result Value Ref Range   Troponin I 0.32 (H) <0.031 ng/mL    Comment: RESULTS PREVIOUSLY CALLED TO MEGAN JONES ON 02/16/15 AT 1645PM BY TB.        PERSISTENTLY INCREASED TROPONIN VALUES IN THE RANGE OF 0.04-0.49 ng/mL CAN BE SEEN IN:       -  UNSTABLE ANGINA       -CONGESTIVE HEART FAILURE       -MYOCARDITIS       -CHEST TRAUMA       -ARRYHTHMIAS       -LATE PRESENTING MYOCARDIAL INFARCTION       -COPD   CLINICAL FOLLOW-UP RECOMMENDED.   Glucose, capillary     Status: Abnormal   Collection Time: 02/16/15  9:54 PM  Result Value Ref Range   Glucose-Capillary 231 (H) 65 - 99 mg/dL  Troponin I     Status: Abnormal   Collection Time: 02/17/15  5:36 AM  Result Value Ref Range   Troponin I 0.32 (H) <0.031 ng/mL    Comment: READ BACK AND VERIFIED WITH ROBIN THOMAS ON 02/17/2015 AT 0719 [place]        PERSISTENTLY INCREASED TROPONIN VALUES IN THE RANGE OF 0.04-0.49 ng/mL CAN BE SEEN  IN:       -UNSTABLE ANGINA       -CONGESTIVE HEART FAILURE       -MYOCARDITIS       -CHEST TRAUMA       -ARRYHTHMIAS       -LATE PRESENTING MYOCARDIAL INFARCTION       -COPD   CLINICAL FOLLOW-UP RECOMMENDED.   Basic metabolic panel     Status: Abnormal   Collection Time: 02/17/15  5:36 AM  Result Value Ref Range   Sodium 140 135 - 145 mmol/L   Potassium 4.0 3.5 - 5.1 mmol/L   Chloride 111 101 - 111 mmol/L   CO2 26 22 - 32 mmol/L   Glucose, Bld 138 (H) 65 - 99 mg/dL   BUN 11 6 - 20 mg/dL   Creatinine, Ser 0.94 0.61 - 1.24 mg/dL   Calcium 9.0 8.9 - 10.3 mg/dL   GFR calc non Af Amer >60 >60 mL/min   GFR calc Af Amer >60 >60 mL/min    Comment: (NOTE) The eGFR has been calculated using the CKD EPI equation. This calculation has not been validated in all clinical situations. eGFR's persistently <60 mL/min signify possible Chronic Kidney Disease.    Anion gap 3 (L) 5 - 15  CBC     Status: Abnormal   Collection Time: 02/17/15  5:36 AM  Result Value Ref Range   WBC 7.6 3.8 - 10.6 K/uL   RBC 4.77 4.40 - 5.90 MIL/uL   Hemoglobin 15.1 13.0 - 18.0 g/dL   HCT 44.2 40.0 - 52.0 %   MCV 92.7 80.0 - 100.0 fL   MCH 31.6 26.0 - 34.0 pg   MCHC 34.1 32.0 - 36.0 g/dL   RDW 14.1 11.5 - 14.5 %   Platelets 140 (L) 150 - 440 K/uL  Glucose, capillary     Status: Abnormal   Collection Time: 02/17/15  7:17 AM  Result Value Ref Range   Glucose-Capillary 129 (H) 65 - 99 mg/dL    Ct Head Wo Contrast  02/16/2015  CLINICAL DATA:  Code stroke, sudden onset of slurred speech and numbness to BILATERAL upper extremities and face, type II diabetes mellitus, unspecified essential hypertension, hyperlipidemia EXAM: CT HEAD WITHOUT CONTRAST TECHNIQUE: Contiguous axial images were obtained from the base of the skull through the vertex without intravenous contrast. COMPARISON:  None FINDINGS: Normal ventricular morphology. No midline shift or mass effect. Low-attenuation at lateral aspect of LEFT cerebellum  question infarct versus artifact from beam hardening images 7-8. Otherwise normal appearance of brain parenchyma. No intracranial hemorrhage, mass lesion, or acute infarction. Visualized paranasal  sinuses and mastoid air cells clear. Bones unremarkable. IMPRESSION: Questionable artifact at lateral aspect of LEFT cerebellum, cannot exclude infarct. No intracranial hemorrhage or additional focal parenchymal brain abnormalities identified. Findings called to Dr. Jimmye Norman on 02/16/2015 at 1525 hours. Electronically Signed   By: Lavonia Dana M.D.   On: 02/16/2015 15:27    Review of Systems  Constitutional: Negative.   HENT: Negative.   Eyes: Negative.   Respiratory: Negative.   Cardiovascular: Negative.   Gastrointestinal: Negative.   Genitourinary: Negative.   Musculoskeletal: Negative.   Skin: Negative.   Neurological: Positive for dizziness, tingling, speech change and focal weakness. Negative for tremors, sensory change, seizures and loss of consciousness.  Psychiatric/Behavioral: Negative.    Blood pressure 139/98, pulse 65, temperature 97.9 F (36.6 C), temperature source Oral, resp. rate 18, height 6' (1.829 m), weight 110.542 kg (243 lb 11.2 oz), SpO2 98 %. Physical Exam  Nursing note and vitals reviewed. Constitutional: He appears well-developed and well-nourished. No distress.  HENT:  Head: Normocephalic and atraumatic.  Right Ear: External ear normal.  Left Ear: External ear normal.  Nose: Nose normal.  Mouth/Throat: Oropharynx is clear and moist.  Eyes: Conjunctivae and EOM are normal. Pupils are equal, round, and reactive to light. No scleral icterus.  Neck: Normal range of motion. Neck supple.  Cardiovascular: Normal rate, regular rhythm, normal heart sounds and intact distal pulses.   No murmur heard. Respiratory: Effort normal and breath sounds normal.  GI: Soft. Bowel sounds are normal. He exhibits no distension.  Musculoskeletal: Normal range of motion. He exhibits no  edema.  Neurological:  A+Ox4, nl speech and language PERRLA, EOMI, nl VF, face symmetric, tongue midline 5/5 B, nl tone L dysmmetria arm > leg, nl on R 1+/4 B, mute plantars No neglect, nl pin and temp  NIHSS 2  Skin: Skin is warm and dry. He is not diaphoretic.  Psychiatric: He has a normal mood and affect.   CT of head personally reviewed by me and shows trace white matter changes  Assessment/Plan: 1.  Probable L cerebellar infarct-  Due to dysmmetria on exam but weakness can sometimes appear similar when mild.  Likely due to embolic source if true such as vertebrobasilar disease vs. cardioembolic -  Pt not given tPA due to mild symptoms per chart -  MRI of brain -  MRA of head and neck -  Echo pending -  Continue ASA 34m daily, plavix and lipitor 887m-  Pt needs to exercise more -  Check B12 -  Needs better DM control with goal Hem A1c < 6 -  Permissive HTN ok now until MRI returns -  Will follow  Caitlan Chauca, MACrowell1/17/2016, 9:58 AM     MRI shows small R embolic appearing stroke.  Will need TEE with loop recorder if TTE and carotids negative.  Will send hypercoaguable disorder panel too.

## 2015-02-17 NOTE — Progress Notes (Signed)
   02/17/15 1400  Clinical Encounter Type  Visited With Patient and family together  Visit Type Initial  Referral From Nurse  Consult/Referral To Chaplain  Spiritual Encounters  Spiritual Needs Literature  Advance Directives (For Healthcare)  Does patient have an advance directive? No  Would patient like information on creating an advanced directive? Yes - Scientist, clinical (histocompatibility and immunogenetics) given  Provided Advance Directive forms and education to patient and his wife.  Pt was in the process of being discharged so he said he would take the information home and would bring it back if he decided to complete it.  Pt and his wife thanked me for coming to visit with them.  [place] [phone]

## 2015-02-17 NOTE — Progress Notes (Signed)
*  PRELIMINARY RESULTS* Echocardiogram 2D Echocardiogram has been performed.  Karl Morgan 02/17/2015, 9:24 AM

## 2015-02-17 NOTE — Evaluation (Signed)
Physical Therapy Evaluation Patient Details Name: Karl Morgan MRN: [id number] DOB: 09-30-1960 Today's Date: 02/17/2015   History of Present Illness  54 yo male with onset of CVA with acute findings posterior R MCA, chronic cerebellar encephalomalacia  Clinical Impression  Pt voices concern to be finished with therapy today and ready to go.  Reinforced that PT was ordered to ensure his safety.  Pt agreed to think about outpt therapy and will possibly go for a couple sessions to get some balance training exercises.      Follow Up Recommendations Outpatient PT    Equipment Recommendations  None recommended by PT    Recommendations for Other Services       Precautions / Restrictions Precautions Precautions: Fall Restrictions Weight Bearing Restrictions: No      Mobility  Bed Mobility Overal bed mobility: Modified Independent                Transfers Overall transfer level: Modified independent                  Ambulation/Gait Ambulation/Gait assistance: Modified independent (Device/Increase time) (pt rushes through but no LOB or ) Ambulation Distance (Feet): 250 Feet Assistive device: None (supervised) Gait Pattern/deviations: Step-through pattern;Wide base of support (faster than recommended pace) Gait velocity: rushed Gait velocity interpretation: at or above normal speed for age/gender General Gait Details: Pt is complaining about wanting to leave and walking faster than recommended  Stairs Stairs: Yes Stairs assistance: Supervision Stair Management: One rail Right;Alternating pattern;Forwards Number of Stairs: 14 (x 2) General stair comments: using railing to stabilze gait  Wheelchair Mobility    Modified Rankin (Stroke Patients Only) Modified Rankin (Stroke Patients Only) Pre-Morbid Rankin Score: No significant disability Modified Rankin: Slight disability     Balance Overall balance assessment: Needs assistance Sitting-balance support:  Feet supported Sitting balance-Leahy Scale: Good   Postural control: Posterior lean Standing balance support: No upper extremity supported Standing balance-Leahy Scale: Fair                   Standardized Balance Assessment Standardized Balance Assessment : Berg Balance Test Berg Balance Test Sit to Stand: Able to stand without using hands and stabilize independently Standing Unsupported: Able to stand safely 2 minutes Sitting with Back Unsupported but Feet Supported on Floor or Stool: Able to sit safely and securely 2 minutes Stand to Sit: Sits safely with minimal use of hands Transfers: Able to transfer safely, minor use of hands Turn 360 Degrees: Able to turn 360 degrees safely one side only in 4 seconds or less Standing Unsupported, One Foot in Front: Able to plae foot ahead of the other independently and hold 30 seconds Standing on One Leg: Able to lift leg independently and hold 5-10 seconds         Pertinent Vitals/Pain      Home Living Family/patient expects to be discharged to:: Private residence Living Arrangements: Spouse/significant other Available Help at Discharge: Family;Available 24 hours/day Type of Home: House Home Access: Stairs to enter Entrance Stairs-Rails:  (yes one rail did not state a side) Entrance Stairs-Number of Steps:  (Pt was impatient and did not say)   Home Equipment: None      Prior Function 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)               Hand Dominance        Extremity/Trunk Assessment   Upper Extremity Assessment: LUE deficits/detail  LUE Deficits / Details: coordination changes related to accuracy of aim    Lower Extremity Assessment:  (Pt has coordination changes with single leg stance and tande)      Cervical / Trunk Assessment: Normal  Communication   Communication: No difficulties  Cognition Arousal/Alertness: Awake/alert Behavior During Therapy: Agitated Overall  Cognitive Status: Within Functional Limits for tasks assessed                      General Comments General comments (skin integrity, edema, etc.): Pt was able to walk with some slight changes in gait but no real LOB, but did have isues with his tandem and backward gait, unsteady.  Will recommend  to follow up with outpatient but may not be interested.    Exercises        Assessment/Plan    PT Assessment All further PT needs can be met in the next venue of care  PT Diagnosis Other (comment) (balance changes BLE's)   PT Problem List Decreased balance;Decreased coordination;Decreased safety awareness;Obesity  PT Treatment Interventions     PT Goals (Current goals can be found in the Care Plan section) Acute Rehab PT Goals Patient Stated Goal: go home today PT Goal Formulation: With patient/family Time For Goal Achievement: 03/03/15 Potential to Achieve Goals: Good    Frequency     Barriers to discharge        Co-evaluation               End of Session   Activity Tolerance: Patient tolerated treatment well Patient left: in bed;with call bell/phone within reach;with family/visitor present (sitting bedside) Nurse Communication: Mobility status         Time: 5183-4373 PT Time Calculation (min) (ACUTE ONLY): 23 min   Charges:   PT Evaluation $Initial PT Evaluation Tier I: 1 Procedure PT Treatments $Gait Training: 8-22 mins   PT G CodesRamond Dial 03/10/15, 2:28 PM   [name], PT MS Acute Rehab Dept. Number: [facility] [phone] and [place] [phone]

## 2015-02-17 NOTE — Plan of Care (Signed)
Problem: Physical Regulation: Goal: Will remain free from infection Outcome: Progressing Pt on Augmentin that was initiated prior to admission  Problem: Skin Integrity: Goal: Risk for impaired skin integrity will decrease Outcome: Progressing No skin issues  Problem: Nutrition: Goal: Adequate nutrition will be maintained Outcome: Progressing Pt ate 100 % of Chick Fila brought in by spouse. CBG at bedtime was > 200. Pt refused ssi coverage  Problem: Health Behavior/Discharge Planning: Goal: Ability to manage health-related needs will improve Outcome: Progressing Pt verbalized understanding of stroke related materials. Also, pt said after he had his heart attack he told himself that he would do better as far as his health was concerned. Pt has history of diabetes. Refused bedtime insulin wanted his Metformin. Explanation given that insulin is given depending on the blood sugar. NIH (0). No deficits noted.

## 2015-02-17 NOTE — Progress Notes (Signed)
Patient remains in special procedures at this time.  1000 medications unable to be given.

## 2015-02-21 ENCOUNTER — Ambulatory Visit (INDEPENDENT_AMBULATORY_CARE_PROVIDER_SITE_OTHER): Payer: 59 | Admitting: Family Medicine

## 2015-02-21 ENCOUNTER — Encounter: Payer: Self-pay | Admitting: Family Medicine

## 2015-02-21 VITALS — BP 144/90 | HR 96 | Temp 97.9°F | Resp 16 | Ht 72.0 in | Wt 244.3 lb

## 2015-02-21 DIAGNOSIS — B009 Herpesviral infection, unspecified: Secondary | ICD-10-CM | POA: Diagnosis not present

## 2015-02-21 DIAGNOSIS — I63511 Cerebral infarction due to unspecified occlusion or stenosis of right middle cerebral artery: Secondary | ICD-10-CM | POA: Insufficient documentation

## 2015-02-21 DIAGNOSIS — B0089 Other herpesviral infection: Secondary | ICD-10-CM

## 2015-02-21 MED ORDER — VALACYCLOVIR HCL 1 G PO TABS: 1000.0000 mg | ORAL_TABLET | Freq: Two times a day (BID) | ORAL | Status: DC

## 2015-02-21 NOTE — Progress Notes (Signed)
Name: Karl Morgan   MRN: [id number]    DOB: 03-10-1961   Date:02/21/2015       Progress Note  Subjective  Chief Complaint  Chief Complaint  Patient presents with  . Hospitalization Follow-up    Stroke 02/16/2015    HPI  Pt. Is here for hospital follow up for Stroke. He had an acute MCA territory infarct on Wednesday, November 14th, 2016. Hospital records reviewed.   Patient is also requesting prescription for Valtrex 1000 mg every 12 hours to be taken in case of cold sores. Sc[place]dt has history of herpes simplex infection with outbreaks when he is experiencing increased stress such as work stress. Past Medical History  Diagnosis Date  . Type II or unspecified type diabetes mellitus without mention of complication, uncontrolled 04/04/2011  . Unspecified essential hypertension 04/04/2011  . Hyperlipidemia 04/04/2011  . Hypogonadism male 04/04/2011  . Alopecia 04/04/2011  . Erectile dysfunction 04/04/2011  . Diabetes mellitus   . Substance abuse   . Heart murmur   . Myocardial infarction ([place])   . Stroke [facility])     Past Surgical History  Procedure Laterality Date  . Cardiac catheterization      Family History  Problem Relation Age of Onset  . Hypertension Father   . Dementia Father   . Cancer Neg Hx   . Diabetes Neg Hx   . Heart disease Neg Hx   . Stroke Neg Hx   . Emphysema Mother     Social History   Social History  . Marital Status: Married    Spouse Name: N/A  . Number of Children: N/A  . Years of Education: N/A   Occupational History  . OPERATIONS MANAGER of maintenance [place]   Social History Main Topics  . Smoking status: Never Smoker   . Smokeless tobacco: Never Used  . Alcohol Use: No  . Drug Use: No  . Sexual Activity: Yes   Other Topics Concern  . Not on file   Social History Narrative   Caffienated drinks-no   Seat belt use often-yes   Regular Exercise-no   Smoke alarm in the home-yes   Firearms/guns in the home-yes   History of physical  abuse-no                 Current outpatient prescriptions:  .  Alprostadil, Vasodilator, (EDEX IC), 1 Dose by Intracavernosal route as needed (for erectile dysfunstion). Pt is unsure of the dose., Disp: , Rfl:  .  amoxicillin-clavulanate (AUGMENTIN) 875-125 MG tablet, Take 1 tablet by mouth 2 (two) times daily., Disp: 20 tablet, Rfl: 0 .  aspirin EC 81 MG tablet, Take 81 mg by mouth daily., Disp: , Rfl:  .  atorvastatin (LIPITOR) 80 MG tablet, Take 80 mg by mouth at bedtime., Disp: , Rfl:  .  clopidogrel (PLAVIX) 75 MG tablet, Take 75 mg by mouth daily., Disp: , Rfl:  .  glimepiride (AMARYL) 4 MG tablet, Take 4 mg by mouth daily., Disp: , Rfl:  .  lisinopril (PRINIVIL,ZESTRIL) 5 MG tablet, Take 5 mg by mouth at bedtime., Disp: , Rfl:  .  metFORMIN (GLUCOPHAGE) 850 MG tablet, Take 1 tablet (850 mg total) by mouth 2 (two) times daily with a meal., Disp: 180 tablet, Rfl: 0 .  metoprolol succinate (TOPROL-XL) 25 MG 24 hr tablet, Take 25 mg by mouth daily., Disp: , Rfl:  .  sildenafil (VIAGRA) 100 MG tablet, Take 0.5-1 tablets (50-100 mg total) by mouth daily as needed for  erectile dysfunction., Disp: 5 tablet, Rfl: 11 .  sitaGLIPtin (JANUVIA) 100 MG tablet, Take 100 mg by mouth daily., Disp: , Rfl:  .  testosterone cypionate (DEPOTESTOSTERONE CYPIONATE) 200 MG/ML injection, Inject 200 mg into the muscle every 7 (seven) days. Pt uses on Saturday., Disp: , Rfl:  .  valACYclovir (VALTREX) 1000 MG tablet, Take 1,000 mg by mouth 2 (two) times daily as needed (for fever blisters)., Disp: , Rfl:   Current facility-administered medications:  .  testosterone cypionate (DEPOTESTOTERONE CYPIONATE) injection 200 mg, 200 mg, Intramuscular, Q14 Days, Janith Lima, MD, 200 mg at 07/29/12 1635  Allergies  Allergen Reactions  . Pravachol [Pravastatin Sodium] Other (See Comments)    Reaction:  Muscle aches      Review of Systems  Respiratory: Negative for shortness of breath.   Cardiovascular:  Negative for chest pain.  Neurological: Positive for weakness. Negative for tingling and speech change.    Objective  Filed Vitals:   02/21/15 1409  BP: 144/90  Pulse: 96  Temp: 97.9 F (36.6 C)  TempSrc: Oral  Resp: 16  Height: 6' (1.829 m)  Weight: 244 lb 4.8 oz (110.814 kg)  SpO2: 96%    Physical Exam  Constitutional: He is oriented to person, place, and time and well-developed, well-nourished, and in no distress.  HENT:  Head: Normocephalic and atraumatic.  Cardiovascular: Normal rate and regular rhythm.   Pulmonary/Chest: Effort normal and breath sounds normal.  Neurological: He is alert and oriented to person, place, and time. He has normal strength.  Nursing note and vitals reviewed.    Assessment & Plan  1. Acute right MCA stroke [facility]) Referral to neurology for further continuation of care. Lab work obtained at the hospital for assessment of stroke is still pending and will need follow-up by neurologist. Will also follow-up with cardiology for TEE. Continue on Plavix. - Ambulatory referral to Neurology  2. Recurrent oral herpes simplex infection  - valACYclovir (VALTREX) 1000 MG tablet; Take 1 tablet (1,000 mg total) by mouth 2 (two) times daily.  Dispense: 30 tablet; Refill: 0   Jannely Henthorn [facility] 02/21/2015 2:46 PM

## 2015-02-22 LAB — PROTHROMBIN GENE MUTATION

## 2015-02-22 LAB — LUPUS ANTICOAGULANT PANEL
DRVVT: 36.4 s (ref 0.0–44.0)
PTT Lupus Anticoagulant: 32 s (ref 0.0–40.6)

## 2015-02-22 LAB — FACTOR 5 LEIDEN

## 2015-02-22 LAB — PROTEIN S, TOTAL AND FREE
Protein S Ag, Free: 116 % (ref 57–157)
Protein S Ag, Total: 129 % (ref 60–150)

## 2015-03-03 ENCOUNTER — Encounter: Payer: Self-pay | Admitting: Family Medicine

## 2015-03-29 ENCOUNTER — Other Ambulatory Visit: Payer: Self-pay | Admitting: Family Medicine

## 2015-04-08 DIAGNOSIS — Z8673 Personal history of transient ischemic attack (TIA), and cerebral infarction without residual deficits: Secondary | ICD-10-CM | POA: Diagnosis not present

## 2015-04-11 ENCOUNTER — Other Ambulatory Visit: Payer: Self-pay | Admitting: Family Medicine

## 2015-04-20 ENCOUNTER — Other Ambulatory Visit: Payer: Self-pay | Admitting: Family Medicine

## 2015-04-27 ENCOUNTER — Ambulatory Visit (INDEPENDENT_AMBULATORY_CARE_PROVIDER_SITE_OTHER): Payer: 59 | Admitting: Family Medicine

## 2015-04-27 ENCOUNTER — Encounter: Payer: Self-pay | Admitting: Family Medicine

## 2015-04-27 VITALS — BP 128/80 | HR 80 | Temp 97.9°F | Resp 18 | Ht 72.0 in | Wt 244.5 lb

## 2015-04-27 DIAGNOSIS — E118 Type 2 diabetes mellitus with unspecified complications: Secondary | ICD-10-CM | POA: Diagnosis not present

## 2015-04-27 DIAGNOSIS — I10 Essential (primary) hypertension: Secondary | ICD-10-CM | POA: Diagnosis not present

## 2015-04-27 DIAGNOSIS — E785 Hyperlipidemia, unspecified: Secondary | ICD-10-CM

## 2015-04-27 NOTE — Progress Notes (Signed)
Name: Karl Morgan   MRN: [id number]    DOB: 1960-05-14   Date:04/27/2015       Progress Note  Subjective  Chief Complaint  Chief Complaint  Patient presents with  . Hypertension    3 month follow ups  . Diabetes  . Hyperlipidemia    Hypertension This is a chronic problem. The problem is controlled. Pertinent negatives include no blurred vision, chest pain, headaches, palpitations or shortness of breath. Past treatments include beta blockers and ACE inhibitors. Hypertensive end-organ damage includes CAD/MI and CVA.  Diabetes He presents for his follow-up diabetic visit. He has type 2 diabetes mellitus. Pertinent negatives for hypoglycemia include no headaches. Pertinent negatives for diabetes include no blurred vision and no chest pain. Diabetic complications include a CVA.  Hyperlipidemia This is a chronic problem. The problem is controlled. Recent lipid tests were reviewed and are normal. Exacerbating diseases include obesity. Pertinent negatives include no chest pain, myalgias or shortness of breath. Current antihyperlipidemic treatment includes statins.    Past Medical History  Diagnosis Date  . Type II or unspecified type diabetes mellitus without mention of complication, uncontrolled 04/04/2011  . Unspecified essential hypertension 04/04/2011  . Hyperlipidemia 04/04/2011  . Hypogonadism male 04/04/2011  . Alopecia 04/04/2011  . Erectile dysfunction 04/04/2011  . Diabetes mellitus   . Substance abuse   . Heart murmur   . Myocardial infarction ([place])   . Stroke [facility])     Past Surgical History  Procedure Laterality Date  . Cardiac catheterization      Family History  Problem Relation Age of Onset  . Hypertension Father   . Dementia Father   . Cancer Neg Hx   . Diabetes Neg Hx   . Heart disease Neg Hx   . Stroke Neg Hx   . Emphysema Mother     Social History   Social History  . Marital Status: Married    Spouse Name: N/A  . Number of Children: N/A  . Years of  Education: N/A   Occupational History  . 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 Smoking status: Never Smoker   . Smokeless tobacco: Never Used  . Alcohol Use: No  . Drug Use: No  . Sexual Activity: Yes   Other Topics Concern  . Not on file   Social History Narrative   Caffienated drinks-no   Seat belt use often-yes   Regular Exercise-no   Smoke alarm in the home-yes   Firearms/guns in the home-yes   History of physical abuse-no                Current outpatient prescriptions:  .  Alprostadil, Vasodilator, (EDEX IC), 1 Dose by Intracavernosal route as needed (for erectile dysfunstion). Pt is unsure of the dose., Disp: , Rfl:  .  aspirin EC 81 MG tablet, Take 81 mg by mouth daily., Disp: , Rfl:  .  atorvastatin (LIPITOR) 80 MG tablet, Take 80 mg by mouth at bedtime., Disp: , Rfl:  .  atorvastatin (LIPITOR) 80 MG tablet, TAKE 1 TABLET BY MOUTH ONCE DAILY, Disp: 30 tablet, Rfl: 2 .  clopidogrel (PLAVIX) 75 MG tablet, Take 75 mg by mouth daily., Disp: , Rfl:  .  glimepiride (AMARYL) 4 MG tablet, TAKE 1 TABLET BY MOUTH ONCE DAILY, Disp: 30 tablet, Rfl: 2 .  JANUVIA 100 MG tablet, TAKE 1 TABLET BY MOUTH ONCE DAILY, Disp: 90 tablet, Rfl: 0 .  lisinopril (PRINIVIL,ZESTRIL) 5 MG tablet,  Take 5 mg by mouth at bedtime., Disp: , Rfl:  .  metFORMIN (GLUCOPHAGE) 850 MG tablet, Take 1 tablet (850 mg total) by mouth 2 (two) times daily with a meal., Disp: 180 tablet, Rfl: 0 .  metoprolol succinate (TOPROL-XL) 25 MG 24 hr tablet, Take 25 mg by mouth daily., Disp: , Rfl:  .  metoprolol succinate (TOPROL-XL) 25 MG 24 hr tablet, TAKE 1 TABLET BY MOUTH ONCE DAILY, Disp: 30 tablet, Rfl: 2 .  sildenafil (VIAGRA) 100 MG tablet, Take 0.5-1 tablets (50-100 mg total) by mouth daily as needed for erectile dysfunction., Disp: 5 tablet, Rfl: 11 .  sitaGLIPtin (JANUVIA) 100 MG tablet, Take 100 mg by mouth daily., Disp: , Rfl:  .  testosterone cypionate  (DEPOTESTOSTERONE CYPIONATE) 200 MG/ML injection, Inject 200 mg into the muscle every 7 (seven) days. Pt uses on Saturday., Disp: , Rfl:  .  valACYclovir (VALTREX) 1000 MG tablet, Take 1 tablet (1,000 mg total) by mouth 2 (two) times daily., Disp: 30 tablet, Rfl: 0  Current facility-administered medications:  .  testosterone cypionate (DEPOTESTOTERONE CYPIONATE) injection 200 mg, 200 mg, Intramuscular, Q14 Days, Janith Lima, MD, 200 mg at 07/29/12 1635  Allergies  Allergen Reactions  . Pravachol [Pravastatin Sodium] Other (See Comments)    Reaction:  Muscle aches     Review of Systems  Eyes: Negative for blurred vision.  Respiratory: Negative for shortness of breath.   Cardiovascular: Negative for chest pain and palpitations.  Musculoskeletal: Negative for myalgias.  Neurological: Negative for headaches.    Objective  Filed Vitals:   04/27/15 1326  BP: 128/80  Pulse: 80  Temp: 97.9 F (36.6 C)  TempSrc: Oral  Resp: 18  Height: 6' (1.829 m)  Weight: 244 lb 8 oz (110.904 kg)  SpO2: 98%    Physical Exam  Constitutional: He is oriented to person, place, and time and well-developed, well-nourished, and in no distress.  HENT:  Head: Normocephalic and atraumatic.  Cardiovascular: Normal rate, regular rhythm and normal heart sounds.   Pulmonary/Chest: Effort normal and breath sounds normal.  Musculoskeletal: He exhibits no edema.  Neurological: He is alert and oriented to person, place, and time.  Psychiatric: Mood, memory, affect and judgment normal.  Nursing note and vitals reviewed.    Assessment & Plan  1. Controlled type 2 diabetes mellitus with complication, without long-term current use of insulin (HCC) A1c stable on current therapy. Advised patient to increase physical activity to achieve sustained weight loss which will improve blood pressure, diabetes, and cholesterol and lower the risk of subsequent heart attack or stroke.  2. Essential hypertension BP  stable and controlled on present therapy  3. Hyperlipidemia FLP obtained while inpatient at [facility] for acute stroke. Continue on high-dose statin therapy and recheck in one month.   Cresencio Reesor [facility] 04/27/2015 1:36 PM

## 2015-05-03 DIAGNOSIS — H2000 Unspecified acute and subacute iridocyclitis: Secondary | ICD-10-CM | POA: Diagnosis not present

## 2015-05-05 ENCOUNTER — Telehealth: Payer: Self-pay | Admitting: Family Medicine

## 2015-05-05 ENCOUNTER — Other Ambulatory Visit: Payer: Self-pay | Admitting: Family Medicine

## 2015-05-05 DIAGNOSIS — E785 Hyperlipidemia, unspecified: Secondary | ICD-10-CM

## 2015-05-05 NOTE — Telephone Encounter (Signed)
Pt has an appt on 05/30/15 for 1 mth FU and Fasting Labs. Pt would like his order before he comes in so he can get his labs done and have the results back by the time he comes to his appt. Please advise pt.

## 2015-05-05 NOTE — Telephone Encounter (Signed)
Lab orders are ready for pick up

## 2015-05-09 ENCOUNTER — Other Ambulatory Visit: Payer: Self-pay | Admitting: Family Medicine

## 2015-05-10 DIAGNOSIS — H2000 Unspecified acute and subacute iridocyclitis: Secondary | ICD-10-CM | POA: Diagnosis not present

## 2015-05-19 DIAGNOSIS — L821 Other seborrheic keratosis: Secondary | ICD-10-CM | POA: Diagnosis not present

## 2015-05-19 DIAGNOSIS — L570 Actinic keratosis: Secondary | ICD-10-CM | POA: Diagnosis not present

## 2015-05-19 DIAGNOSIS — L820 Inflamed seborrheic keratosis: Secondary | ICD-10-CM | POA: Diagnosis not present

## 2015-05-26 DIAGNOSIS — E119 Type 2 diabetes mellitus without complications: Secondary | ICD-10-CM | POA: Diagnosis not present

## 2015-05-30 ENCOUNTER — Ambulatory Visit: Payer: 59 | Admitting: Family Medicine

## 2015-06-10 ENCOUNTER — Other Ambulatory Visit
Admission: RE | Admit: 2015-06-10 | Discharge: 2015-06-10 | Disposition: A | Discharge status: Home / Self Care | Payer: 59 | Source: Ambulatory Visit | Attending: Urology | Admitting: Urology

## 2015-06-10 DIAGNOSIS — E291 Testicular hypofunction: Secondary | ICD-10-CM | POA: Diagnosis not present

## 2015-06-10 LAB — HEMOGLOBIN AND HEMATOCRIT, BLOOD
HCT: 48.7 % (ref 40.0–52.0)
Hemoglobin: 16.2 g/dL (ref 13.0–18.0)

## 2015-06-11 LAB — PSA: PSA: 1.11 ng/mL (ref 0.00–4.00)

## 2015-06-11 LAB — TESTOSTERONE: Testosterone: 987 ng/dL (ref 348–1197)

## 2015-06-13 ENCOUNTER — Other Ambulatory Visit: Payer: Self-pay | Admitting: Family Medicine

## 2015-06-13 DIAGNOSIS — E785 Hyperlipidemia, unspecified: Secondary | ICD-10-CM

## 2015-06-15 DIAGNOSIS — N5201 Erectile dysfunction due to arterial insufficiency: Secondary | ICD-10-CM | POA: Diagnosis not present

## 2015-06-15 DIAGNOSIS — E291 Testicular hypofunction: Secondary | ICD-10-CM | POA: Diagnosis not present

## 2015-06-20 ENCOUNTER — Other Ambulatory Visit
Admission: RE | Admit: 2015-06-20 | Discharge: 2015-06-20 | Disposition: A | Discharge status: Home / Self Care | Payer: 59 | Source: Ambulatory Visit | Attending: Family Medicine | Admitting: Family Medicine

## 2015-06-20 ENCOUNTER — Other Ambulatory Visit: Payer: Self-pay | Admitting: Family Medicine

## 2015-06-20 DIAGNOSIS — E785 Hyperlipidemia, unspecified: Secondary | ICD-10-CM | POA: Insufficient documentation

## 2015-06-20 LAB — BASIC METABOLIC PANEL
Anion gap: 6 (ref 5–15)
BUN: 12 mg/dL (ref 6–20)
CO2: 25 mmol/L (ref 22–32)
Calcium: 9.2 mg/dL (ref 8.9–10.3)
Chloride: 104 mmol/L (ref 101–111)
Creatinine, Ser: 1.03 mg/dL (ref 0.61–1.24)
GFR calc Af Amer: >60 mL/min (ref >60)
GFR calc non Af Amer: >60 mL/min (ref >60)
Glucose, Bld: 137 mg/dL — ABNORMAL HIGH (ref 65–99)
Potassium: 4.1 mmol/L (ref 3.5–5.1)
Sodium: 135 mmol/L (ref 135–145)

## 2015-06-20 LAB — LIPID PANEL
Cholesterol: 101 mg/dL (ref 0–200)
HDL: 28 mg/dL — ABNORMAL LOW (ref >40)
LDL Cholesterol: 56 mg/dL (ref 0–99)
Total CHOL/HDL Ratio: 3.6 RATIO
Triglycerides: 84 mg/dL (ref <150)
VLDL: 17 mg/dL (ref 0–40)

## 2015-06-22 ENCOUNTER — Encounter: Payer: Self-pay | Admitting: Family Medicine

## 2015-06-22 ENCOUNTER — Ambulatory Visit: Payer: 59 | Admitting: Family Medicine

## 2015-06-22 ENCOUNTER — Ambulatory Visit (INDEPENDENT_AMBULATORY_CARE_PROVIDER_SITE_OTHER): Payer: 59 | Admitting: Family Medicine

## 2015-06-22 VITALS — BP 136/82 | HR 78 | Temp 97.7°F | Resp 18 | Ht 72.0 in | Wt 245.4 lb

## 2015-06-22 DIAGNOSIS — E119 Type 2 diabetes mellitus without complications: Secondary | ICD-10-CM | POA: Diagnosis not present

## 2015-06-22 DIAGNOSIS — I10 Essential (primary) hypertension: Secondary | ICD-10-CM

## 2015-06-22 DIAGNOSIS — R748 Abnormal levels of other serum enzymes: Secondary | ICD-10-CM | POA: Diagnosis not present

## 2015-06-22 DIAGNOSIS — E785 Hyperlipidemia, unspecified: Secondary | ICD-10-CM | POA: Diagnosis not present

## 2015-06-22 LAB — POCT GLYCOSYLATED HEMOGLOBIN (HGB A1C): Hemoglobin A1C: 6.4

## 2015-06-22 LAB — GLUCOSE, POCT (MANUAL RESULT ENTRY): POC Glucose: 130 mg/dl — AB (ref 70–99)

## 2015-06-22 NOTE — Progress Notes (Signed)
Name: Karl Morgan   MRN: [id number]    DOB: 07/21/1960   Date:06/22/2015       Progress Note  Subjective  Chief Complaint  Chief Complaint  Patient presents with  . Diabetes    pt here for DM follow up    HPI  Diabetes Mellitus Type II: A1c 6.4%, doing well, has lost 10 lbs in last 7-8 months. Has eliminated all sodas from his dietAnd is trying to be more physically active. Currently on Januvia, metformin, and glimepiride. Taking all 3 without any side effects.  Hyperlipidemia: FLP shows LDL 56, (goal <70). On high-dose statin therapy. No side effects reported.   Hypertension: Blood pressure is well controlled, takes metoprolol, and lisinopril for elevated blood pressure. No chest pain, palpitations, headaches, blurry vision, or other concerning symptoms. No medication side effects reported  Past Medical History  Diagnosis Date  . Type II or unspecified type diabetes mellitus without mention of complication, uncontrolled 04/04/2011  . Unspecified essential hypertension 04/04/2011  . Hyperlipidemia 04/04/2011  . Hypogonadism male 04/04/2011  . Alopecia 04/04/2011  . Erectile dysfunction 04/04/2011  . Diabetes mellitus   . Substance abuse   . Heart murmur   . Myocardial infarction ([place])   . Stroke [facility])     Past Surgical History  Procedure Laterality Date  . Cardiac catheterization      Family History  Problem Relation Age of Onset  . Hypertension Father   . Dementia Father   . Cancer Neg Hx   . Diabetes Neg Hx   . Heart disease Neg Hx   . Stroke Neg Hx   . Emphysema Mother     Social History   Social History  . Marital Status: Married    Spouse Name: N/A  . Number of Children: N/A  . Years of Education: N/A   Occupational History  . 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 Smoking status: Never Smoker   . Smokeless tobacco: Never Used  . Alcohol Use: No  . Drug Use: No  . Sexual Activity: Yes   Other Topics Concern  . Not  on file   Social History Narrative   Caffienated drinks-no   Seat belt use often-yes   Regular Exercise-no   Smoke alarm in the home-yes   Firearms/guns in the home-yes   History of physical abuse-no                 Current outpatient prescriptions:  .  Alprostadil, Vasodilator, (EDEX IC), 1 Dose by Intracavernosal route as needed (for erectile dysfunstion). Pt is unsure of the dose., Disp: , Rfl:  .  aspirin EC 81 MG tablet, Take 81 mg by mouth daily., Disp: , Rfl:  .  atorvastatin (LIPITOR) 80 MG tablet, Take 80 mg by mouth at bedtime., Disp: , Rfl:  .  atorvastatin (LIPITOR) 80 MG tablet, TAKE 1 TABLET BY MOUTH ONCE DAILY, Disp: 30 tablet, Rfl: 2 .  clopidogrel (PLAVIX) 75 MG tablet, Take 75 mg by mouth daily., Disp: , Rfl:  .  glimepiride (AMARYL) 4 MG tablet, TAKE 1 TABLET BY MOUTH ONCE DAILY, Disp: 30 tablet, Rfl: 2 .  JANUVIA 100 MG tablet, TAKE 1 TABLET BY MOUTH ONCE DAILY, Disp: 90 tablet, Rfl: 0 .  lisinopril (PRINIVIL,ZESTRIL) 5 MG tablet, TAKE 1 TABLET BY MOUTH ONCE DAILY, Disp: 30 tablet, Rfl: 3 .  metFORMIN (GLUCOPHAGE) 850 MG tablet, TAKE 1 TABLET BY MOUTH 2 TIMES DAILY WITH A  MEAL., Disp: 180 tablet, Rfl: 0 .  metoprolol succinate (TOPROL-XL) 25 MG 24 hr tablet, Take 25 mg by mouth daily., Disp: , Rfl:  .  metoprolol succinate (TOPROL-XL) 25 MG 24 hr tablet, TAKE 1 TABLET BY MOUTH ONCE DAILY, Disp: 30 tablet, Rfl: 2 .  sildenafil (VIAGRA) 100 MG tablet, Take 0.5-1 tablets (50-100 mg total) by mouth daily as needed for erectile dysfunction., Disp: 5 tablet, Rfl: 11 .  sitaGLIPtin (JANUVIA) 100 MG tablet, Take 100 mg by mouth daily., Disp: , Rfl:  .  testosterone cypionate (DEPOTESTOSTERONE CYPIONATE) 200 MG/ML injection, Inject 200 mg into the muscle every 7 (seven) days. Pt uses on Saturday., Disp: , Rfl:  .  valACYclovir (VALTREX) 1000 MG tablet, Take 1 tablet (1,000 mg total) by mouth 2 (two) times daily., Disp: 30 tablet, Rfl: 0  Current facility-administered  medications:  .  testosterone cypionate (DEPOTESTOTERONE CYPIONATE) injection 200 mg, 200 mg, Intramuscular, Q14 Days, Janith Lima, MD, 200 mg at 07/29/12 1635  Allergies  Allergen Reactions  . Pravachol [Pravastatin Sodium] Other (See Comments)    Reaction:  Muscle aches      Review of Systems  Constitutional: Negative for fever and chills.  Eyes: Negative for blurred vision and double vision.  Cardiovascular: Negative for chest pain and palpitations.  Gastrointestinal: Negative for abdominal pain.  Genitourinary: Negative for frequency.  Musculoskeletal: Negative for myalgias and back pain.  Neurological: Negative for headaches.    Objective  Filed Vitals:   06/22/15 1401  BP: 136/82  Pulse: 78  Temp: 97.7 F (36.5 C)  Resp: 18  Height: 6' (1.829 m)  Weight: 245 lb 6 oz (111.301 kg)  SpO2: 96%    Physical Exam  Constitutional: He is oriented to person, place, and time and well-developed, well-nourished, and in no distress.  HENT:  Head: Normocephalic and atraumatic.  Cardiovascular: Normal rate and regular rhythm.   Pulmonary/Chest: Effort normal and breath sounds normal.  Abdominal: Soft. Bowel sounds are normal.  Neurological: He is alert and oriented to person, place, and time.  Psychiatric: Mood, memory, affect and judgment normal.  Nursing note and vitals reviewed.    Assessment & Plan  1. Controlled type 2 diabetes mellitus without complication, without long-term current use of insulin (HCC)  A1c is very well controlled. Advised to continue on present dietary and pharmacotherapy. Recheck in 3-4 month - POCT HgB A1C - POCT Glucose (CBG) - Urine Microalbumin w/creat. ratio  2. Essential hypertension BP is at goal on present therapy.  3. Elevated liver enzymes  - Comprehensive Metabolic Panel (CMET)  4. Hyperlipidemia Continue high-dose statin therapy, no adverse effects. Goal LDL less than 70.    Romyn Boswell [facility] 06/22/2015 2:37 PM

## 2015-06-25 ENCOUNTER — Emergency Department (HOSPITAL_COMMUNITY): Payer: 59

## 2015-06-25 ENCOUNTER — Encounter (HOSPITAL_COMMUNITY): Payer: Self-pay | Admitting: Nurse Practitioner

## 2015-06-25 ENCOUNTER — Emergency Department (HOSPITAL_COMMUNITY)
Admission: EM | Admit: 2015-06-25 | Discharge: 2015-06-25 | Disposition: A | Discharge status: Home / Self Care | Payer: 59 | Attending: Emergency Medicine | Admitting: Emergency Medicine

## 2015-06-25 ENCOUNTER — Emergency Department (HOSPITAL_COMMUNITY)
Admission: EM | Admit: 2015-06-25 | Discharge: 2015-06-25 | Discharge status: Against Medical Advice | Payer: 59 | Source: Home / Self Care

## 2015-06-25 DIAGNOSIS — Z79899 Other long term (current) drug therapy: Secondary | ICD-10-CM | POA: Diagnosis not present

## 2015-06-25 DIAGNOSIS — E785 Hyperlipidemia, unspecified: Secondary | ICD-10-CM | POA: Insufficient documentation

## 2015-06-25 DIAGNOSIS — Z7902 Long term (current) use of antithrombotics/antiplatelets: Secondary | ICD-10-CM | POA: Diagnosis not present

## 2015-06-25 DIAGNOSIS — E119 Type 2 diabetes mellitus without complications: Secondary | ICD-10-CM | POA: Diagnosis not present

## 2015-06-25 DIAGNOSIS — Y998 Other external cause status: Secondary | ICD-10-CM | POA: Insufficient documentation

## 2015-06-25 DIAGNOSIS — I10 Essential (primary) hypertension: Secondary | ICD-10-CM | POA: Diagnosis not present

## 2015-06-25 DIAGNOSIS — Y9289 Other specified places as the place of occurrence of the external cause: Secondary | ICD-10-CM | POA: Insufficient documentation

## 2015-06-25 DIAGNOSIS — I252 Old myocardial infarction: Secondary | ICD-10-CM | POA: Insufficient documentation

## 2015-06-25 DIAGNOSIS — Z7984 Long term (current) use of oral hypoglycemic drugs: Secondary | ICD-10-CM | POA: Diagnosis not present

## 2015-06-25 DIAGNOSIS — W293XXA Contact with powered garden and outdoor hand tools and machinery, initial encounter: Secondary | ICD-10-CM | POA: Diagnosis not present

## 2015-06-25 DIAGNOSIS — R011 Cardiac murmur, unspecified: Secondary | ICD-10-CM | POA: Diagnosis not present

## 2015-06-25 DIAGNOSIS — Z7982 Long term (current) use of aspirin: Secondary | ICD-10-CM | POA: Insufficient documentation

## 2015-06-25 DIAGNOSIS — N529 Male erectile dysfunction, unspecified: Secondary | ICD-10-CM | POA: Diagnosis not present

## 2015-06-25 DIAGNOSIS — Z9889 Other specified postprocedural states: Secondary | ICD-10-CM | POA: Diagnosis not present

## 2015-06-25 DIAGNOSIS — Z23 Encounter for immunization: Secondary | ICD-10-CM | POA: Insufficient documentation

## 2015-06-25 DIAGNOSIS — Z872 Personal history of diseases of the skin and subcutaneous tissue: Secondary | ICD-10-CM | POA: Insufficient documentation

## 2015-06-25 DIAGNOSIS — Z8673 Personal history of transient ischemic attack (TIA), and cerebral infarction without residual deficits: Secondary | ICD-10-CM | POA: Diagnosis not present

## 2015-06-25 DIAGNOSIS — S81812A Laceration without foreign body, left lower leg, initial encounter: Secondary | ICD-10-CM | POA: Diagnosis not present

## 2015-06-25 DIAGNOSIS — Y9389 Activity, other specified: Secondary | ICD-10-CM | POA: Diagnosis not present

## 2015-06-25 MED ORDER — SULFAMETHOXAZOLE-TRIMETHOPRIM 800-160 MG PO TABS: 1.0000 | ORAL_TABLET | Freq: Two times a day (BID) | ORAL | Status: AC

## 2015-06-25 MED ORDER — LIDOCAINE-EPINEPHRINE (PF) 2 %-1:200000 IJ SOLN
10.0000 mL | Freq: Once | INTRAMUSCULAR | Status: AC
  Administered 2015-06-25: 10 mL
  Filled 2015-06-25: qty 20

## 2015-06-25 MED ORDER — TETANUS-DIPHTH-ACELL PERTUSSIS 5-2.5-18.5 LF-MCG/0.5 IM SUSP
0.5000 mL | Freq: Once | INTRAMUSCULAR | Status: AC
  Administered 2015-06-25: 0.5 mL via INTRAMUSCULAR
  Filled 2015-06-25: qty 0.5

## 2015-06-25 NOTE — Discharge Instructions (Signed)
Laceration Care, Adult  A laceration is a cut that goes through all layers of the skin. The cut also goes into the tissue that is right under the skin. Some cuts heal on their own. Others need to be closed with stitches (sutures), staples, skin adhesive strips, or wound glue. Taking care of your cut lowers your risk of infection and helps your cut to heal better.  HOW TO TAKE CARE OF YOUR CUT  For stitches or staples:  · Keep the wound clean and dry.  · If you were given a bandage (dressing), you should change it at least one time per day or as told by your doctor. You should also change it if it gets wet or dirty.  · Keep the wound completely dry for the first 24 hours or as told by your doctor. After that time, you may take a shower or a bath. However, make sure that the wound is not soaked in water until after the stitches or staples have been removed.  · Clean the wound one time each day or as told by your doctor:    Wash the wound with soap and water.    Rinse the wound with water until all of the soap comes off.    Pat the wound dry with a clean towel. Do not rub the wound.  · After you clean the wound, put a thin layer of antibiotic ointment on it as told by your doctor. This ointment:    Helps to prevent infection.    Keeps the bandage from sticking to the wound.  · Have your stitches or staples removed as told by your doctor.  If your doctor used skin adhesive strips:   · Keep the wound clean and dry.  · If you were given a bandage, you should change it at least one time per day or as told by your doctor. You should also change it if it gets dirty or wet.  · Do not get the skin adhesive strips wet. You can take a shower or a bath, but be careful to keep the wound dry.  · If the wound gets wet, pat it dry with a clean towel. Do not rub the wound.  · Skin adhesive strips fall off on their own. You can trim the strips as the wound heals. Do not remove any strips that are still stuck to the wound. They will  fall off after a while.  If your doctor used wound glue:  · Try to keep your wound dry, but you may briefly wet it in the shower or bath. Do not soak the wound in water, such as by swimming.  · After you take a shower or a bath, gently pat the wound dry with a clean towel. Do not rub the wound.  · Do not do any activities that will make you really sweaty until the skin glue has fallen off on its own.  · Do not apply liquid, cream, or ointment medicine to your wound while the skin glue is still on.  · If you were given a bandage, you should change it at least one time per day or as told by your doctor. You should also change it if it gets dirty or wet.  · If a bandage is placed over the wound, do not let the tape for the bandage touch the skin glue.  · Do not pick at the glue. The skin glue usually stays on for 5-10 days. Then, it   or when wound glue stays in place and the wound is healed. Make sure to wear a sunscreen of at least 30 SPF.  Take over-the-counter and prescription medicines only as told by your doctor.  If you were given antibiotic medicine or ointment, take or apply it as told by your doctor. Do not stop using the antibiotic even if your wound is getting better.  Do not scratch or pick at the wound.  Keep all follow-up visits as told by your doctor. This is important.  Check your wound every day for signs of infection. Watch for:  Redness, swelling, or pain.  Fluid, blood, or pus.  Raise (elevate) the injured area above the level of your heart while you are sitting or lying down, if possible. GET HELP IF:  You got a tetanus shot and you have any of these problems at the injection site:  Swelling.  Very bad pain.  Redness.  Bleeding.  You have a fever.  A wound that was  closed breaks open.  You notice a bad smell coming from your wound or your bandage.  You notice something coming out of the wound, such as wood or glass.  Medicine does not help your pain.  You have more redness, swelling, or pain at the site of your wound.  You have fluid, blood, or pus coming from your wound.  You notice a change in the color of your skin near your wound.  You need to change the bandage often because fluid, blood, or pus is coming from the wound.  You start to have a new rash.  You start to have numbness around the wound. GET HELP RIGHT AWAY IF:  You have very bad swelling around the wound.  Your pain suddenly gets worse and is very bad.  You notice painful lumps near the wound or on skin that is anywhere on your body.  You have a red streak going away from your wound.  The wound is on your hand or foot and you cannot move a finger or toe like you usually can.  The wound is on your hand or foot and you notice that your fingers or toes look pale or bluish.   This information is not intended to replace advice given to you by your health care provider. Make sure you discuss any questions you have with your health care provider.   Take antibiotics as prescribed to prevent infection. Keep wound clean and dry. May wash with soap and water. You will need to have your sutures removed in 7 days. Return to the emergency department if you experience severe redness and swelling around your wound, fevers, chills, drainage from the wound.

## 2015-06-25 NOTE — ED Provider Notes (Signed)
CSN: [id number]     Arrival date & time 06/25/15  1727 History  By signing my name below, I, Emmanuella Mensah, attest that this documentation has been prepared under the direction and in the presence of [employer], PA-C. Electronically Signed: Judithann Sauger, ED Scribe. 06/25/2015. 6:18 PM.    Chief Complaint  Patient presents with  . Laceration   The history is provided by the patient. No language interpreter was used.   HPI Comments: Karl Morgan is a 55 y.o. male with a hx of DM and HLD who presents to the Emergency Department complaining of an non-actively bleeding laceration to the medial aspect of his left leg above his left ankle s/p injury with a chainsaw that occurred at 4 pm (2 hours ago). Pt presents with a gauze over the area. He explains that he was cutting down a stump and he was kicking it down when the chainsaw cut him in his left leg. He states that he did not feel any pain immediately but noticed that his pants had been cut in the process. Pt is unaware of his last tetanus vaccine. He denies any fever, numbness, or tingling.    Past Medical History  Diagnosis Date  . Type II or unspecified type diabetes mellitus without mention of complication, uncontrolled 04/04/2011  . Unspecified essential hypertension 04/04/2011  . Hyperlipidemia 04/04/2011  . Hypogonadism male 04/04/2011  . Alopecia 04/04/2011  . Erectile dysfunction 04/04/2011  . Diabetes mellitus   . Substance abuse   . Heart murmur   . Myocardial infarction ([place])   . Stroke [facility])    Past Surgical History  Procedure Laterality Date  . Cardiac catheterization     Family History  Problem Relation Age of Onset  . Hypertension Father   . Dementia Father   . Cancer Neg Hx   . Diabetes Neg Hx   . Heart disease Neg Hx   . Stroke Neg Hx   . Emphysema Mother    Social History  Substance Use Topics  . Smoking status: Never Smoker   . Smokeless tobacco: Never Used  . Alcohol Use: No    Review of Systems   All other systems reviewed and are negative.     Allergies  Pravachol  Home Medications   Prior to Admission medications   Medication Sig Start Date End Date Taking? Authorizing Provider  Alprostadil, Vasodilator, (EDEX IC) 1 Dose by Intracavernosal route as needed (for erectile dysfunstion). Pt is unsure of the dose.    Historical Provider, MD  aspirin EC 81 MG tablet Take 81 mg by mouth daily.    Historical Provider, MD  atorvastatin (LIPITOR) 80 MG tablet Take 80 mg by mouth at bedtime.    Historical Provider, MD  atorvastatin (LIPITOR) 80 MG tablet TAKE 1 TABLET BY MOUTH ONCE DAILY 04/21/15   Roselee Nova, MD  clopidogrel (PLAVIX) 75 MG tablet Take 75 mg by mouth daily.    Historical Provider, MD  glimepiride (AMARYL) 4 MG tablet TAKE 1 TABLET BY MOUTH ONCE DAILY 06/20/15   Roselee Nova, MD  JANUVIA 100 MG tablet TAKE 1 TABLET BY MOUTH ONCE DAILY 04/11/15   Roselee Nova, MD  lisinopril (PRINIVIL,ZESTRIL) 5 MG tablet TAKE 1 TABLET BY MOUTH ONCE DAILY 06/13/15   Roselee Nova, MD  metFORMIN (GLUCOPHAGE) 850 MG tablet TAKE 1 TABLET BY MOUTH 2 TIMES DAILY WITH A MEAL. 05/09/15   Roselee Nova, MD  metoprolol succinate (  TOPROL-XL) 25 MG 24 hr tablet Take 25 mg by mouth daily.    Historical Provider, MD  metoprolol succinate (TOPROL-XL) 25 MG 24 hr tablet TAKE 1 TABLET BY MOUTH ONCE DAILY 04/11/15   Roselee Nova, MD  sildenafil (VIAGRA) 100 MG tablet Take 0.5-1 tablets (50-100 mg total) by mouth daily as needed for erectile dysfunction. 11/10/12   Wendie Agreste, MD  sitaGLIPtin (JANUVIA) 100 MG tablet Take 100 mg by mouth daily.    Historical Provider, MD  testosterone cypionate (DEPOTESTOSTERONE CYPIONATE) 200 MG/ML injection Inject 200 mg into the muscle every 7 (seven) days. Pt uses on Saturday.    Historical Provider, MD  valACYclovir (VALTREX) 1000 MG tablet Take 1 tablet (1,000 mg total) by mouth 2 (two) times daily. 02/21/15   Roselee Nova, MD   BP 183/94 mmHg   Pulse 95  Temp(Src) 98 F (36.7 C) (Oral)  Resp 18  Wt 242 lb 11.2 oz (110.088 kg)  SpO2 98% Physical Exam  Constitutional: He is oriented to person, place, and time. He appears well-developed and well-nourished. No distress.  HENT:  Head: Normocephalic and atraumatic.  Eyes: Conjunctivae are normal. Right eye exhibits no discharge. Left eye exhibits no discharge. No scleral icterus.  Cardiovascular: Normal rate.   Pulmonary/Chest: Effort normal.  Musculoskeletal:  3 cm horizontal laceration to left medial shin. No foreign body seen, no surrounding erythema, no active bleeding, no obvious bony deformity, no sensory deficits.   Neurological: He is alert and oriented to person, place, and time. Coordination normal.  Skin: Skin is warm and dry. No rash noted. He is not diaphoretic. No erythema. No pallor.  Psychiatric: He has a normal mood and affect. His behavior is normal.  Nursing note and vitals reviewed.   ED Course  Procedures (including critical care time) DIAGNOSTIC STUDIES: Oxygen Saturation is 98% on RA, normal by my interpretation.    COORDINATION OF CARE: 6:00 PM- Pt advised of plan for treatment and pt agrees. Pt will receive x-ray for further evaluation.   Imaging Review Dg Tibia/fibula Left  06/25/2015  CLINICAL DATA:  Laceration to anterior shin from chainsaw this p.m., possible foreign body EXAM: LEFT TIBIA AND FIBULA - 2 VIEW COMPARISON:  None. FINDINGS: Two views of the left tibia fibula submitted. No acute fracture or subluxation. No radiopaque foreign body is noted. Mild soft tissue/ skin regularity distal tibial region medially. IMPRESSION: No acute fracture or subluxation. No radiopaque foreign body. Soft tissue and skin irregularity distal tibial region medially. Clinical correlation is necessary. Electronically Signed   By: Lahoma Crocker M.D.   On: 06/25/2015 19:30    Donnald Garre, PA-C has personally reviewed and evaluated these images as part of her medical  decision-making.  LACERATION REPAIR PROCEDURE NOTE The patient's identification was confirmed and consent was obtained. This procedure was performed by Donnald Garre, PA-C at 8:25 PM. Site: left shin Sterile procedures observed: yes Anesthetic used (type and amt): lidocaine 2% w/ epi, 44ml Suture type/size: 3-0 prolene Length: 3cm3 # of Sutures: 4 Technique:simple interrupted Complexity: simple Antibx ointment applied: bacitracin Tetanus UTD or ordered: yes Site anesthetized, irrigated with NS, explored without evidence of foreign body, wound well approximated, site covered with dry, sterile dressing.  Patient tolerated procedure well without complications. Instructions for care discussed verbally and patient provided with additional written instructions for homecare and f/u.   MDM   Final diagnoses:  Laceration of leg, left, initial encounter  Tdap booster given.Pressure irrigation performed. Laceration  occurred < 8 hours prior to repair which was well tolerated. Pt has hx of DM, will d/c home with abx.  Discussed suture home care w pt and answered questions. Pt to f-u for wound check and suture removal in 7 days. Pt is hemodynamically stable w no complaints prior to dc.      I personally performed the services described in this documentation, which was scribed in my presence. The recorded information has been reviewed and is accurate.      Dondra Spry [place], PA-C 06/25/15 2029  Davonna Belling, MD 06/25/15 [id number]

## 2015-06-25 NOTE — ED Notes (Signed)
Pt with laceration to L inner ankle/calf with chainsaw pta, oozing blood, no pain, cms intact

## 2015-07-05 ENCOUNTER — Encounter: Payer: Self-pay | Admitting: Family Medicine

## 2015-07-05 ENCOUNTER — Ambulatory Visit (INDEPENDENT_AMBULATORY_CARE_PROVIDER_SITE_OTHER): Payer: 59 | Admitting: Family Medicine

## 2015-07-05 VITALS — BP 118/70 | HR 75 | Temp 98.0°F | Resp 17 | Ht 72.0 in | Wt 241.8 lb

## 2015-07-05 DIAGNOSIS — S81812D Laceration without foreign body, left lower leg, subsequent encounter: Secondary | ICD-10-CM | POA: Diagnosis not present

## 2015-07-05 DIAGNOSIS — S81812A Laceration without foreign body, left lower leg, initial encounter: Secondary | ICD-10-CM | POA: Insufficient documentation

## 2015-07-05 NOTE — Progress Notes (Signed)
Name: Karl Morgan   MRN: [id number]    DOB: July 08, 1960   Date:07/05/2015       Progress Note  Subjective  Chief Complaint  Chief Complaint  Patient presents with  . Acute Visit    Removal of stitches    HPI  Patient presents for removal of stitches placed in the [facility] ED after sustaining injury to his left lower leg while operating a chainsaw. Sutures placed on 06/25/2015. He was advised to have the sutures removed within 7-10 days  Past Medical History  Diagnosis Date  . Type II or unspecified type diabetes mellitus without mention of complication, uncontrolled 04/04/2011  . Unspecified essential hypertension 04/04/2011  . Hyperlipidemia 04/04/2011  . Hypogonadism male 04/04/2011  . Alopecia 04/04/2011  . Erectile dysfunction 04/04/2011  . Diabetes mellitus   . Substance abuse   . Heart murmur   . Myocardial infarction ([place])   . Stroke [facility])     Past Surgical History  Procedure Laterality Date  . Cardiac catheterization      Family History  Problem Relation Age of Onset  . Hypertension Father   . Dementia Father   . Cancer Neg Hx   . Diabetes Neg Hx   . Heart disease Neg Hx   . Stroke Neg Hx   . Emphysema Mother     Social History   Social History  . Marital Status: Married    Spouse Name: N/A  . Number of Children: N/A  . Years of Education: N/A   Occupational History  . OPERATIONS MANAGER of maintenance [employer]   Social History Main Topics  . Smoking status: Never Smoker   . Smokeless tobacco: Never Used  . Alcohol Use: No  . Drug Use: No  . Sexual Activity: Yes   Other Topics Concern  . Not on file   Social History Narrative   Caffienated drinks-no   Seat belt use often-yes   Regular Exercise-no   Smoke alarm in the home-yes   Firearms/guns in the home-yes   History of physical abuse-no                 Current outpatient prescriptions:  .  Alprostadil, Vasodilator, (EDEX IC), 1 Dose by Intracavernosal route as needed (for erectile  dysfunstion). Pt is unsure of the dose., Disp: , Rfl:  .  aspirin EC 81 MG tablet, Take 81 mg by mouth daily., Disp: , Rfl:  .  atorvastatin (LIPITOR) 80 MG tablet, Take 80 mg by mouth at bedtime., Disp: , Rfl:  .  atorvastatin (LIPITOR) 80 MG tablet, TAKE 1 TABLET BY MOUTH ONCE DAILY, Disp: 30 tablet, Rfl: 2 .  clopidogrel (PLAVIX) 75 MG tablet, Take 75 mg by mouth daily., Disp: , Rfl:  .  glimepiride (AMARYL) 4 MG tablet, TAKE 1 TABLET BY MOUTH ONCE DAILY, Disp: 30 tablet, Rfl: 2 .  JANUVIA 100 MG tablet, TAKE 1 TABLET BY MOUTH ONCE DAILY, Disp: 90 tablet, Rfl: 0 .  lisinopril (PRINIVIL,ZESTRIL) 5 MG tablet, TAKE 1 TABLET BY MOUTH ONCE DAILY, Disp: 30 tablet, Rfl: 3 .  metFORMIN (GLUCOPHAGE) 850 MG tablet, TAKE 1 TABLET BY MOUTH 2 TIMES DAILY WITH A MEAL., Disp: 180 tablet, Rfl: 0 .  metoprolol succinate (TOPROL-XL) 25 MG 24 hr tablet, Take 25 mg by mouth daily., Disp: , Rfl:  .  metoprolol succinate (TOPROL-XL) 25 MG 24 hr tablet, TAKE 1 TABLET BY MOUTH ONCE DAILY, Disp: 30 tablet, Rfl: 2 .  sildenafil (VIAGRA) 100 MG tablet,  Take 0.5-1 tablets (50-100 mg total) by mouth daily as needed for erectile dysfunction., Disp: 5 tablet, Rfl: 11 .  sitaGLIPtin (JANUVIA) 100 MG tablet, Take 100 mg by mouth daily., Disp: , Rfl:  .  testosterone cypionate (DEPOTESTOSTERONE CYPIONATE) 200 MG/ML injection, Inject 200 mg into the muscle every 7 (seven) days. Pt uses on Saturday., Disp: , Rfl:  .  valACYclovir (VALTREX) 1000 MG tablet, Take 1 tablet (1,000 mg total) by mouth 2 (two) times daily., Disp: 30 tablet, Rfl: 0  Current facility-administered medications:  .  testosterone cypionate (DEPOTESTOTERONE CYPIONATE) injection 200 mg, 200 mg, Intramuscular, Q14 Days, Janith Lima, MD, 200 mg at 07/29/12 1635  Allergies  Allergen Reactions  . Pravachol [Pravastatin Sodium] Other (See Comments)    Reaction:  Muscle aches      ROS    Objective  Filed Vitals:   07/05/15 1518  BP: 118/70  Pulse:  75  Temp: 98 F (36.7 C)  TempSrc: Oral  Resp: 17  Height: 6' (1.829 m)  Weight: 241 lb 12.8 oz (109.68 kg)  SpO2: 98%    Physical Exam  Musculoskeletal:       Legs: Left lower extremity medial aspect has a healing sutured laceration, some surrounding erythema, no drainage,  Nursing note and vitals reviewed.     Assessment & Plan  1. Laceration of left lower leg, subsequent encounter Advised to have sutures removed either at an urgent care or the ER within a day or 2 after the laceration has healed completely. patient verbalized agreement   Algie Westry Asad A. [facility] 07/05/2015 7:07 PM

## 2015-07-06 ENCOUNTER — Other Ambulatory Visit
Admission: RE | Admit: 2015-07-06 | Discharge: 2015-07-06 | Disposition: A | Discharge status: Home / Self Care | Payer: 59 | Source: Ambulatory Visit | Attending: Family Medicine | Admitting: Family Medicine

## 2015-07-06 DIAGNOSIS — R748 Abnormal levels of other serum enzymes: Secondary | ICD-10-CM | POA: Diagnosis not present

## 2015-07-06 LAB — COMPREHENSIVE METABOLIC PANEL
ALT: 31 U/L (ref 17–63)
AST: 26 U/L (ref 15–41)
Albumin: 4.5 g/dL (ref 3.5–5.0)
Alkaline Phosphatase: 93 U/L (ref 38–126)
Anion gap: 5 (ref 5–15)
BUN: 15 mg/dL (ref 6–20)
CO2: 25 mmol/L (ref 22–32)
Calcium: 9.2 mg/dL (ref 8.9–10.3)
Chloride: 104 mmol/L (ref 101–111)
Creatinine, Ser: 1.15 mg/dL (ref 0.61–1.24)
GFR calc Af Amer: >60 mL/min (ref >60)
GFR calc non Af Amer: >60 mL/min (ref >60)
Glucose, Bld: 170 mg/dL — ABNORMAL HIGH (ref 65–99)
Potassium: 4.3 mmol/L (ref 3.5–5.1)
Sodium: 134 mmol/L — ABNORMAL LOW (ref 135–145)
Total Bilirubin: 0.8 mg/dL (ref 0.3–1.2)
Total Protein: 7.4 g/dL (ref 6.5–8.1)

## 2015-07-07 DIAGNOSIS — Z4802 Encounter for removal of sutures: Secondary | ICD-10-CM | POA: Diagnosis not present

## 2015-07-07 LAB — MICROALBUMIN / CREATININE URINE RATIO
Creatinine, Urine: 62.6 mg/dL
Microalb Creat Ratio: <4.8 mg/g creat (ref 0.0–30.0)
Microalb, Ur: <3 ug/mL — ABNORMAL HIGH

## 2015-07-11 ENCOUNTER — Other Ambulatory Visit: Payer: Self-pay | Admitting: Family Medicine

## 2015-07-20 ENCOUNTER — Other Ambulatory Visit: Payer: Self-pay | Admitting: Family Medicine

## 2015-08-05 DIAGNOSIS — S81812S Laceration without foreign body, left lower leg, sequela: Secondary | ICD-10-CM | POA: Diagnosis not present

## 2015-08-05 DIAGNOSIS — L03116 Cellulitis of left lower limb: Secondary | ICD-10-CM | POA: Diagnosis not present

## 2015-08-09 ENCOUNTER — Telehealth: Payer: Self-pay | Admitting: *Deleted

## 2015-08-09 NOTE — Telephone Encounter (Signed)
oened in error

## 2015-08-15 ENCOUNTER — Other Ambulatory Visit: Payer: Self-pay | Admitting: Family Medicine

## 2015-09-06 DIAGNOSIS — E669 Obesity, unspecified: Secondary | ICD-10-CM | POA: Diagnosis not present

## 2015-09-06 DIAGNOSIS — I2510 Atherosclerotic heart disease of native coronary artery without angina pectoris: Secondary | ICD-10-CM | POA: Diagnosis not present

## 2015-09-06 DIAGNOSIS — I209 Angina pectoris, unspecified: Secondary | ICD-10-CM | POA: Diagnosis not present

## 2015-09-06 DIAGNOSIS — I10 Essential (primary) hypertension: Secondary | ICD-10-CM | POA: Diagnosis not present

## 2015-09-08 ENCOUNTER — Other Ambulatory Visit: Payer: Self-pay | Admitting: Family Medicine

## 2015-09-19 ENCOUNTER — Other Ambulatory Visit: Payer: Self-pay | Admitting: Family Medicine

## 2015-09-22 ENCOUNTER — Encounter: Payer: Self-pay | Admitting: Family Medicine

## 2015-09-22 ENCOUNTER — Ambulatory Visit (INDEPENDENT_AMBULATORY_CARE_PROVIDER_SITE_OTHER): Payer: 59 | Admitting: Family Medicine

## 2015-09-22 VITALS — BP 118/75 | HR 78 | Temp 98.0°F | Resp 15 | Ht 72.0 in | Wt 243.8 lb

## 2015-09-22 DIAGNOSIS — E785 Hyperlipidemia, unspecified: Secondary | ICD-10-CM | POA: Diagnosis not present

## 2015-09-22 DIAGNOSIS — E118 Type 2 diabetes mellitus with unspecified complications: Secondary | ICD-10-CM | POA: Diagnosis not present

## 2015-09-22 DIAGNOSIS — I10 Essential (primary) hypertension: Secondary | ICD-10-CM

## 2015-09-22 NOTE — Progress Notes (Signed)
Name: Karl Morgan   MRN: [id number]    DOB: 1961/02/21   Date:09/22/2015       Progress Note  Subjective  Chief Complaint  Chief Complaint  Patient presents with  . Follow-up    3 mo  . Diabetes    Diabetes He presents for his follow-up diabetic visit. He has type 2 diabetes mellitus. His disease course has been stable. Pertinent negatives for hypoglycemia include no headaches. Pertinent negatives for diabetes include no chest pain, no fatigue, no polydipsia and no polyuria. Diabetic complications include a CVA. Current diabetic treatment includes oral agent (triple therapy).  Hypertension This is a chronic problem. The problem is unchanged. The problem is controlled. Pertinent negatives include no chest pain, headaches or palpitations. Past treatments include beta blockers and ACE inhibitors. Hypertensive end-organ damage includes CAD/MI and CVA.  Hyperlipidemia This is a chronic problem. The problem is controlled. Recent lipid tests were reviewed and are normal. Exacerbating diseases include diabetes and obesity. Pertinent negatives include no chest pain, leg pain or myalgias. Current antihyperlipidemic treatment includes statins.     Past Medical History  Diagnosis Date  . Type II or unspecified type diabetes mellitus without mention of complication, uncontrolled 04/04/2011  . Unspecified essential hypertension 04/04/2011  . Hyperlipidemia 04/04/2011  . Hypogonadism male 04/04/2011  . Alopecia 04/04/2011  . Erectile dysfunction 04/04/2011  . Diabetes mellitus   . Substance abuse   . Heart murmur   . Myocardial infarction ([place])   . Stroke [facility])     Past Surgical History  Procedure Laterality Date  . Cardiac catheterization      Family History  Problem Relation Age of Onset  . Hypertension Father   . Dementia Father   . Cancer Neg Hx   . Diabetes Neg Hx   . Heart disease Neg Hx   . Stroke Neg Hx   . Emphysema Mother     Social History   Social History  . Marital Status:  Married    Spouse Name: N/A  . Number of Children: N/A  . Years of Education: N/A   Occupational History  . 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 Smoking status: Never Smoker   . Smokeless tobacco: Never Used  . Alcohol Use: No  . Drug Use: No  . Sexual Activity: Yes   Other Topics Concern  . Not on file   Social History Narrative   Caffienated drinks-no   Seat belt use often-yes   Regular Exercise-no   Smoke alarm in the home-yes   Firearms/guns in the home-yes   History of physical abuse-no                Current outpatient prescriptions:  .  Alprostadil, Vasodilator, (EDEX IC), 1 Dose by Intracavernosal route as needed (for erectile dysfunstion). Pt is unsure of the dose., Disp: , Rfl:  .  aspirin EC 81 MG tablet, Take 81 mg by mouth daily., Disp: , Rfl:  .  atorvastatin (LIPITOR) 80 MG tablet, TAKE 1 TABLET BY MOUTH ONCE DAILY, Disp: 30 tablet, Rfl: 2 .  clopidogrel (PLAVIX) 75 MG tablet, Take 75 mg by mouth daily., Disp: , Rfl:  .  glimepiride (AMARYL) 4 MG tablet, TAKE 1 TABLET BY MOUTH ONCE DAILY, Disp: 30 tablet, Rfl: 2 .  JANUVIA 100 MG tablet, TAKE 1 TABLET BY MOUTH ONCE DAILY, Disp: 90 tablet, Rfl: 0 .  lisinopril (PRINIVIL,ZESTRIL) 5 MG tablet, TAKE 1 TABLET BY MOUTH  ONCE DAILY, Disp: 30 tablet, Rfl: 3 .  metFORMIN (GLUCOPHAGE) 850 MG tablet, TAKE 1 TABLET BY MOUTH 2 TIMES DAILY WITH A MEAL., Disp: 180 tablet, Rfl: 0 .  metoprolol succinate (TOPROL-XL) 25 MG 24 hr tablet, TAKE 1 TABLET BY MOUTH ONCE DAILY, Disp: 30 tablet, Rfl: 2 .  sildenafil (VIAGRA) 100 MG tablet, Take 0.5-1 tablets (50-100 mg total) by mouth daily as needed for erectile dysfunction., Disp: 5 tablet, Rfl: 11 .  testosterone cypionate (DEPOTESTOSTERONE CYPIONATE) 200 MG/ML injection, Inject 200 mg into the muscle every 7 (seven) days. Pt uses on Saturday., Disp: , Rfl:  .  valACYclovir (VALTREX) 1000 MG tablet, TAKE 1 TABLET BY MOUTH TWICE DAILY, Disp:  30 tablet, Rfl: 0  Current facility-administered medications:  .  testosterone cypionate (DEPOTESTOTERONE CYPIONATE) injection 200 mg, 200 mg, Intramuscular, Q14 Days, Janith Lima, MD, 200 mg at 07/29/12 1635  Allergies  Allergen Reactions  . Pravachol [Pravastatin Sodium] Other (See Comments)    Reaction:  Muscle aches     Review of Systems  Constitutional: Negative for fatigue.  Cardiovascular: Negative for chest pain and palpitations.  Musculoskeletal: Negative for myalgias.  Neurological: Negative for headaches.  Endo/Heme/Allergies: Negative for polydipsia.    Objective  Filed Vitals:   09/22/15 1532  BP: 118/75  Pulse: 78  Temp: 98 F (36.7 C)  TempSrc: Oral  Resp: 15  Height: 6' (1.829 m)  Weight: 243 lb 12.8 oz (110.587 kg)  SpO2: 97%    Physical Exam  Constitutional: He is oriented to person, place, and time and well-developed, well-nourished, and in no distress.  HENT:  Head: Normocephalic and atraumatic.  Cardiovascular: Normal rate, regular rhythm and normal heart sounds.   Pulmonary/Chest: Effort normal and breath sounds normal. He has no wheezes. He has no rhonchi.  Abdominal: Soft. Bowel sounds are normal. There is no tenderness.  Musculoskeletal:       Right ankle: He exhibits no swelling.       Left ankle: He exhibits no swelling.  Neurological: He is alert and oriented to person, place, and time.  Nursing note and vitals reviewed.   Assessment & Plan  1. Controlled type 2 diabetes mellitus with complication, without long-term current use of insulin (HCC) A1c is at goal at 6.9%, continue on present diabetic regimen. - POCT HgB A1C - POCT Glucose (CBG)  2. Hyperlipidemia Fasting lipid panel at goal, labs reviewed from March 2017. Repeat today - Lipid Profile - Comprehensive Metabolic Panel (CMET)  3. Essential hypertension Blood pressure at goal and well controlled on present therapy   Micheala Morissette Asad A. [facility] 09/22/2015 3:51 PM

## 2015-09-28 ENCOUNTER — Other Ambulatory Visit: Payer: Self-pay | Admitting: Family Medicine

## 2015-10-03 ENCOUNTER — Other Ambulatory Visit: Payer: Self-pay | Admitting: Family Medicine

## 2015-10-03 DIAGNOSIS — M25511 Pain in right shoulder: Secondary | ICD-10-CM | POA: Diagnosis not present

## 2015-10-03 DIAGNOSIS — M19011 Primary osteoarthritis, right shoulder: Secondary | ICD-10-CM | POA: Diagnosis not present

## 2015-10-20 DIAGNOSIS — M67911 Unspecified disorder of synovium and tendon, right shoulder: Secondary | ICD-10-CM | POA: Diagnosis not present

## 2015-10-20 DIAGNOSIS — M542 Cervicalgia: Secondary | ICD-10-CM | POA: Diagnosis not present

## 2015-10-21 ENCOUNTER — Other Ambulatory Visit: Payer: Self-pay | Admitting: Family Medicine

## 2015-11-14 ENCOUNTER — Other Ambulatory Visit: Payer: Self-pay | Admitting: Family Medicine

## 2015-11-19 DIAGNOSIS — M25511 Pain in right shoulder: Secondary | ICD-10-CM | POA: Diagnosis not present

## 2015-11-19 DIAGNOSIS — M542 Cervicalgia: Secondary | ICD-10-CM | POA: Diagnosis not present

## 2015-11-19 DIAGNOSIS — M4722 Other spondylosis with radiculopathy, cervical region: Secondary | ICD-10-CM | POA: Diagnosis not present

## 2015-12-26 ENCOUNTER — Ambulatory Visit: Payer: 59 | Admitting: Family Medicine

## 2015-12-28 ENCOUNTER — Other Ambulatory Visit: Payer: Self-pay | Admitting: Family Medicine

## 2015-12-30 ENCOUNTER — Telehealth: Payer: Self-pay | Admitting: Family Medicine

## 2015-12-30 DIAGNOSIS — E785 Hyperlipidemia, unspecified: Secondary | ICD-10-CM

## 2015-12-30 NOTE — Telephone Encounter (Signed)
PT IS COMING IN FOR APPT ON OCT 9TH FOR FOLLOW UP WITH BLOOD WORK BUT THE PATIENT IS WANTING ORDERS FOR BLOOD WORK FIRST SO THAT WHEN HE SEES YOU ON THE 9TH YOU WILL HAVE THE BLOOD WORK RESULTS ALREADY WITH YOU . PLEASE ADVISE FOR HE SAID HE DID NOT WANTTO MAKE 2 TRIPS.

## 2015-12-30 NOTE — Telephone Encounter (Signed)
Lab work is printed and ready for pick-up. We will obtain point-of-care testing at the time of his office visit

## 2016-01-02 ENCOUNTER — Telehealth: Payer: Self-pay | Admitting: Family Medicine

## 2016-01-02 ENCOUNTER — Other Ambulatory Visit: Payer: Self-pay

## 2016-01-02 DIAGNOSIS — E785 Hyperlipidemia, unspecified: Secondary | ICD-10-CM

## 2016-01-02 DIAGNOSIS — E118 Type 2 diabetes mellitus with unspecified complications: Secondary | ICD-10-CM

## 2016-01-02 MED ORDER — GLIMEPIRIDE 4 MG PO TABS: 4.0000 mg | ORAL_TABLET | Freq: Every day | ORAL | 0 refills | Status: DC

## 2016-01-02 NOTE — Telephone Encounter (Signed)
Prescription for glimepiride is sent to [facility] employee pharmacy

## 2016-01-02 NOTE — Telephone Encounter (Signed)
Pt needs refill on Glimepiride to be sent to [facility]. Pt took his last one today. Pt has an appt on 01/09/2016.

## 2016-01-03 ENCOUNTER — Other Ambulatory Visit
Admission: RE | Admit: 2016-01-03 | Discharge: 2016-01-03 | Disposition: A | Discharge status: Home / Self Care | Payer: 59 | Source: Ambulatory Visit | Attending: Family Medicine | Admitting: Family Medicine

## 2016-01-03 DIAGNOSIS — E785 Hyperlipidemia, unspecified: Secondary | ICD-10-CM | POA: Insufficient documentation

## 2016-01-03 LAB — COMPREHENSIVE METABOLIC PANEL
ALT: 49 U/L (ref 17–63)
AST: 33 U/L (ref 15–41)
Albumin: 4.2 g/dL (ref 3.5–5.0)
Alkaline Phosphatase: 69 U/L (ref 38–126)
Anion gap: 6 (ref 5–15)
BUN: 12 mg/dL (ref 6–20)
CO2: 25 mmol/L (ref 22–32)
Calcium: 9 mg/dL (ref 8.9–10.3)
Chloride: 107 mmol/L (ref 101–111)
Creatinine, Ser: 1.19 mg/dL (ref 0.61–1.24)
GFR calc Af Amer: >60 mL/min (ref >60)
GFR calc non Af Amer: >60 mL/min (ref >60)
Glucose, Bld: 150 mg/dL — ABNORMAL HIGH (ref 65–99)
Potassium: 4.3 mmol/L (ref 3.5–5.1)
Sodium: 138 mmol/L (ref 135–145)
Total Bilirubin: 1 mg/dL (ref 0.3–1.2)
Total Protein: 6.7 g/dL (ref 6.5–8.1)

## 2016-01-03 LAB — LIPID PANEL
Cholesterol: 127 mg/dL (ref 0–200)
HDL: 28 mg/dL — ABNORMAL LOW (ref >40)
LDL Cholesterol: 79 mg/dL (ref 0–99)
Total CHOL/HDL Ratio: 4.5 RATIO
Triglycerides: 98 mg/dL (ref <150)
VLDL: 20 mg/dL (ref 0–40)

## 2016-01-03 NOTE — Telephone Encounter (Signed)
Tried calling pt to inform of refill, no answer.

## 2016-01-09 ENCOUNTER — Encounter: Payer: Self-pay | Admitting: Family Medicine

## 2016-01-09 ENCOUNTER — Ambulatory Visit (INDEPENDENT_AMBULATORY_CARE_PROVIDER_SITE_OTHER): Payer: 59 | Admitting: Family Medicine

## 2016-01-09 VITALS — BP 126/72 | HR 76 | Temp 97.8°F | Resp 16 | Ht 72.0 in | Wt 240.8 lb

## 2016-01-09 DIAGNOSIS — Z8673 Personal history of transient ischemic attack (TIA), and cerebral infarction without residual deficits: Secondary | ICD-10-CM | POA: Diagnosis not present

## 2016-01-09 DIAGNOSIS — E118 Type 2 diabetes mellitus with unspecified complications: Secondary | ICD-10-CM

## 2016-01-09 DIAGNOSIS — E785 Hyperlipidemia, unspecified: Secondary | ICD-10-CM | POA: Diagnosis not present

## 2016-01-09 DIAGNOSIS — I10 Essential (primary) hypertension: Secondary | ICD-10-CM | POA: Diagnosis not present

## 2016-01-09 LAB — POCT GLYCOSYLATED HEMOGLOBIN (HGB A1C): Hemoglobin A1C: 7.1

## 2016-01-09 LAB — GLUCOSE, POCT (MANUAL RESULT ENTRY): POC Glucose: 187 mg/dl — AB (ref 70–99)

## 2016-01-09 MED ORDER — SITAGLIPTIN PHOSPHATE 100 MG PO TABS: 100.0000 mg | ORAL_TABLET | Freq: Every day | ORAL | 0 refills | Status: DC

## 2016-01-09 NOTE — Progress Notes (Signed)
Name: Karl Morgan   MRN: [id number]    DOB: October 06, 1960   Date:01/09/2016       Progress Note  Subjective  Chief Complaint  Chief Complaint  Patient presents with  . Diabetes    3 month follow up  . Hyperlipidemia  . Hypertension    Diabetes  He presents for his follow-up diabetic visit. He has type 2 diabetes mellitus. His disease course has been stable. Pertinent negatives for hypoglycemia include no headaches. Pertinent negatives for diabetes include no blurred vision, no chest pain, no fatigue, no polydipsia and no polyuria. Diabetic complications include a CVA. Current diabetic treatment includes oral agent (monotherapy). He is following a diabetic (drinks a lot of diet soda) diet. His breakfast blood glucose range is generally 110-130 mg/dl. An ACE inhibitor/angiotensin II receptor blocker is being taken.  Hyperlipidemia  This is a chronic problem. The problem is uncontrolled. Recent lipid tests were reviewed and are high. Exacerbating diseases include diabetes and obesity. He has no history of nephrotic syndrome. Pertinent negatives include no chest pain, myalgias or shortness of breath. Current antihyperlipidemic treatment includes statins.  Hypertension  This is a chronic problem. The problem is unchanged. The problem is controlled. Pertinent negatives include no blurred vision, chest pain, headaches, palpitations or shortness of breath. Hypertensive end-organ damage includes CAD/MI and CVA. There is no history of kidney disease.     Past Medical History:  Diagnosis Date  . Alopecia 04/04/2011  . Diabetes mellitus   . Erectile dysfunction 04/04/2011  . Heart murmur   . Hyperlipidemia 04/04/2011  . Hypogonadism male 04/04/2011  . Myocardial infarction   . Stroke ([place])   . Substance abuse   . Type II or unspecified type diabetes mellitus without mention of complication, uncontrolled 04/04/2011  . Unspecified essential hypertension 04/04/2011    Past Surgical History:  Procedure  Laterality Date  . CARDIAC CATHETERIZATION      Family History  Problem Relation Age of Onset  . Hypertension Father   . Dementia Father   . Emphysema Mother   . Cancer Neg Hx   . Diabetes Neg Hx   . Heart disease Neg Hx   . Stroke Neg Hx     Social History   Social History  . Marital status: Married    Spouse name: N/A  . Number of children: N/A  . Years of education: N/A   Occupational History  . OPERATIONS MANAGER of maintenance [place]   Social History Main Topics  . Smoking status: Never Smoker  . Smokeless tobacco: Never Used  . Alcohol use No  . Drug use: No  . Sexual activity: Yes   Other Topics Concern  . Not on file   Social History Narrative   Caffienated drinks-no   Seat belt use often-yes   Regular Exercise-no   Smoke alarm in the home-yes   Firearms/guns in the home-yes   History of physical abuse-no                 Current Outpatient Prescriptions:  .  Alprostadil, Vasodilator, (EDEX IC), 1 Dose by Intracavernosal route as needed (for erectile dysfunstion). Pt is unsure of the dose., Disp: , Rfl:  .  aspirin EC 81 MG tablet, Take 81 mg by mouth daily., Disp: , Rfl:  .  atorvastatin (LIPITOR) 80 MG tablet, TAKE 1 TABLET BY MOUTH ONCE DAILY, Disp: 30 tablet, Rfl: 2 .  clopidogrel (PLAVIX) 75 MG tablet, Take 75 mg by mouth daily., Disp: ,  Rfl:  .  glimepiride (AMARYL) 4 MG tablet, Take 1 tablet (4 mg total) by mouth daily., Disp: 90 tablet, Rfl: 0 .  JANUVIA 100 MG tablet, TAKE 1 TABLET BY MOUTH ONCE DAILY, Disp: 90 tablet, Rfl: 0 .  lisinopril (PRINIVIL,ZESTRIL) 5 MG tablet, TAKE 1 TABLET BY MOUTH ONCE DAILY, Disp: 30 tablet, Rfl: 4 .  metFORMIN (GLUCOPHAGE) 850 MG tablet, TAKE 1 TABLET BY MOUTH 2 TIMES DAILY WITH A MEAL, Disp: 180 tablet, Rfl: 0 .  metoprolol succinate (TOPROL-XL) 25 MG 24 hr tablet, TAKE 1 TABLET BY MOUTH ONCE DAILY, Disp: 30 tablet, Rfl: 2 .  sildenafil (VIAGRA) 100 MG tablet, Take 0.5-1 tablets (50-100 mg total) by  mouth daily as needed for erectile dysfunction., Disp: 5 tablet, Rfl: 11 .  testosterone cypionate (DEPOTESTOSTERONE CYPIONATE) 200 MG/ML injection, Inject 200 mg into the muscle every 7 (seven) days. Pt uses on Saturday., Disp: , Rfl:  .  valACYclovir (VALTREX) 1000 MG tablet, TAKE 1 TABLET BY MOUTH TWICE DAILY, Disp: 30 tablet, Rfl: 0  Current Facility-Administered Medications:  .  testosterone cypionate (DEPOTESTOTERONE CYPIONATE) injection 200 mg, 200 mg, Intramuscular, Q14 Days, Janith Lima, MD, 200 mg at 07/29/12 1635  Allergies  Allergen Reactions  . Pravachol [Pravastatin Sodium] Other (See Comments)    Reaction:  Muscle aches      Review of Systems  Constitutional: Negative for fatigue.  Eyes: Negative for blurred vision.  Respiratory: Negative for shortness of breath.   Cardiovascular: Negative for chest pain and palpitations.  Musculoskeletal: Negative for myalgias.  Neurological: Negative for headaches.  Endo/Heme/Allergies: Negative for polydipsia.    Objective  Vitals:   01/09/16 0853  BP: 126/72  Pulse: 76  Resp: 16  Temp: 97.8 F (36.6 C)  TempSrc: Oral  SpO2: 96%  Weight: 240 lb 12.8 oz (109.2 kg)  Height: 6' (1.829 m)    Physical Exam  Constitutional: He is oriented to person, place, and time and well-developed, well-nourished, and in no distress.  HENT:  Head: Normocephalic and atraumatic.  Cardiovascular: Normal rate, regular rhythm and normal heart sounds.   No murmur heard. Pulmonary/Chest: Effort normal and breath sounds normal. He has no wheezes.  Neurological: He is alert and oriented to person, place, and time.  Psychiatric: Mood, memory, affect and judgment normal.  Nursing note and vitals reviewed.      Assessment & Plan  1. Controlled type 2 diabetes mellitus with complication, without long-term current use of insulin (HCC) Hemoglobin A1c 7.1%, well-controlled diabetes, continue present pharmacotherapy - POCT HgB A1C - POCT  Glucose (CBG) - sitaGLIPtin (JANUVIA) 100 MG tablet; Take 1 tablet (100 mg total) by mouth daily.  Dispense: 90 tablet; Refill: 0  2. History of stroke Takes Plavix 75 mg daily  3. Essential hypertension BP stable and controlled on present antihypertensive therapy  4. Hyperlipidemia, unspecified hyperlipidemia type Goal LDL less than 70, on high-dose atorvastatin. Will work on diet and exercise for next 3 months and recheck   [employer] [facility] 01/09/2016 9:14 AM

## 2016-01-16 ENCOUNTER — Other Ambulatory Visit: Payer: Self-pay | Admitting: Family Medicine

## 2016-02-29 ENCOUNTER — Encounter: Payer: Self-pay | Admitting: Family Medicine

## 2016-02-29 ENCOUNTER — Ambulatory Visit (INDEPENDENT_AMBULATORY_CARE_PROVIDER_SITE_OTHER): Payer: 59 | Admitting: Family Medicine

## 2016-02-29 VITALS — BP 124/80 | HR 83 | Temp 97.7°F | Resp 18 | Ht 72.0 in | Wt 245.3 lb

## 2016-02-29 DIAGNOSIS — Z0000 Encounter for general adult medical examination without abnormal findings: Secondary | ICD-10-CM | POA: Diagnosis not present

## 2016-02-29 LAB — CBC WITH DIFFERENTIAL/PLATELET
Basophils Absolute: 0 cells/uL (ref 0–200)
Basophils Relative: 0 %
Eosinophils Absolute: 152 cells/uL (ref 15–500)
Eosinophils Relative: 2 %
HCT: 46.2 % (ref 38.5–50.0)
Hemoglobin: 15.7 g/dL (ref 13.2–17.1)
Lymphocytes Relative: 15 %
Lymphs Abs: 1140 cells/uL (ref 850–3900)
MCH: 31.5 pg (ref 27.0–33.0)
MCHC: 34 g/dL (ref 32.0–36.0)
MCV: 92.8 fL (ref 80.0–100.0)
MPV: 11.4 fL (ref 7.5–12.5)
Monocytes Absolute: 684 cells/uL (ref 200–950)
Monocytes Relative: 9 %
Neutro Abs: 5624 cells/uL (ref 1500–7800)
Neutrophils Relative %: 74 %
Platelets: 166 10*3/uL (ref 140–400)
RBC: 4.98 MIL/uL (ref 4.20–5.80)
RDW: 13.5 % (ref 11.0–15.0)
WBC: 7.6 10*3/uL (ref 3.8–10.8)

## 2016-02-29 LAB — TSH: TSH: 3.42 mIU/L (ref 0.40–4.50)

## 2016-02-29 NOTE — Progress Notes (Signed)
Name: Karl Morgan   MRN: [id number]    DOB: 10/29/1960   Date:02/29/2016       Progress Note  Subjective  Chief Complaint  Chief Complaint  Patient presents with  . Annual Exam    HPI  Pt. presents for a Complete Physical Exam.  PSA and DRE usually performed by Urologist.  Colonoscopy performed in November 2016.    Past Medical History:  Diagnosis Date  . Alopecia 04/04/2011  . Diabetes mellitus   . Erectile dysfunction 04/04/2011  . Heart murmur   . Hyperlipidemia 04/04/2011  . Hypogonadism male 04/04/2011  . Myocardial infarction   . Stroke ([place])   . Substance abuse   . Type II or unspecified type diabetes mellitus without mention of complication, uncontrolled 04/04/2011  . Unspecified essential hypertension 04/04/2011    Past Surgical History:  Procedure Laterality Date  . CARDIAC CATHETERIZATION      Family History  Problem Relation Age of Onset  . Hypertension Father   . Dementia Father   . Emphysema Mother   . Cancer Neg Hx   . Diabetes Neg Hx   . Heart disease Neg Hx   . Stroke Neg Hx     Social History   Social History  . Marital status: Married    Spouse name: N/A  . Number of children: N/A  . Years of education: N/A   Occupational History  . OPERATIONS MANAGER of maintenance    Social History Main Topics  . Smoking status: Never Smoker  . Smokeless tobacco: Never Used  . Alcohol use No  . Drug use: No  . Sexual activity: Yes   Other Topics Concern  . Not on file   Social History Narrative   Caffienated drinks-no   Seat belt use often-yes   Regular Exercise-no   Smoke alarm in the home-yes   Firearms/guns in the home-yes   History of physical abuse-no                 Current Outpatient Prescriptions:  .  Alprostadil, Vasodilator, (EDEX IC), 1 Dose by Intracavernosal route as needed (for erectile dysfunstion). Pt is unsure of the dose., Disp: , Rfl:  .  aspirin EC 81 MG tablet, Take 81 mg by mouth daily., Disp: , Rfl:  .   atorvastatin (LIPITOR) 80 MG tablet, TAKE 1 TABLET BY MOUTH ONCE DAILY, Disp: 30 tablet, Rfl: 2 .  clopidogrel (PLAVIX) 75 MG tablet, Take 75 mg by mouth daily., Disp: , Rfl:  .  glimepiride (AMARYL) 4 MG tablet, Take 1 tablet (4 mg total) by mouth daily., Disp: 90 tablet, Rfl: 0 .  lisinopril (PRINIVIL,ZESTRIL) 5 MG tablet, TAKE 1 TABLET BY MOUTH ONCE DAILY, Disp: 30 tablet, Rfl: 4 .  metFORMIN (GLUCOPHAGE) 850 MG tablet, TAKE 1 TABLET BY MOUTH 2 TIMES DAILY WITH A MEAL, Disp: 180 tablet, Rfl: 0 .  metoprolol succinate (TOPROL-XL) 25 MG 24 hr tablet, TAKE 1 TABLET BY MOUTH ONCE DAILY, Disp: 30 tablet, Rfl: 2 .  sildenafil (VIAGRA) 100 MG tablet, Take 0.5-1 tablets (50-100 mg total) by mouth daily as needed for erectile dysfunction., Disp: 5 tablet, Rfl: 11 .  sitaGLIPtin (JANUVIA) 100 MG tablet, Take 1 tablet (100 mg total) by mouth daily., Disp: 90 tablet, Rfl: 0 .  testosterone cypionate (DEPOTESTOSTERONE CYPIONATE) 200 MG/ML injection, Inject 200 mg into the muscle every 7 (seven) days. Pt uses on Saturday., Disp: , Rfl:  .  valACYclovir (VALTREX) 1000 MG tablet, TAKE 1 TABLET  BY MOUTH TWICE DAILY, Disp: 30 tablet, Rfl: 0  Current Facility-Administered Medications:  .  testosterone cypionate (DEPOTESTOTERONE CYPIONATE) injection 200 mg, 200 mg, Intramuscular, Q14 Days, Janith Lima, MD, 200 mg at 07/29/12 1635  Allergies  Allergen Reactions  . Pravachol [Pravastatin Sodium] Other (See Comments)    Reaction:  Muscle aches     Review of Systems  Constitutional: Negative for chills, fever and malaise/fatigue.  HENT: Negative for congestion and sore throat.   Eyes: Negative for blurred vision and double vision.  Respiratory: Negative for cough, sputum production and shortness of breath.   Cardiovascular: Negative for chest pain and leg swelling.  Gastrointestinal: Negative for abdominal pain, blood in stool and melena.  Genitourinary: Negative for dysuria and hematuria.   Musculoskeletal: Negative for back pain and neck pain.  Neurological: Negative for dizziness and headaches.  Psychiatric/Behavioral: Negative for depression. The patient is not nervous/anxious and does not have insomnia.     Objective  Vitals:   02/29/16 0911  BP: 124/80  Pulse: 83  Resp: 18  Temp: 97.7 F (36.5 C)  TempSrc: Oral  SpO2: 95%  Weight: 245 lb 4.8 oz (111.3 kg)  Height: 6' (1.829 m)    Physical Exam  Constitutional: He is oriented to person, place, and time and well-developed, well-nourished, and in no distress.  HENT:  Head: Normocephalic and atraumatic.  Cardiovascular: Normal rate, regular rhythm, S1 normal, S2 normal and normal heart sounds.   No murmur heard. Pulmonary/Chest: Effort normal and breath sounds normal. He has no wheezes. He has no rhonchi.  Abdominal: Soft. Bowel sounds are normal. There is no tenderness.  Genitourinary:  Genitourinary Comments: Deferred   Musculoskeletal:       Right ankle: He exhibits no swelling.       Left ankle: He exhibits no swelling.  Neurological: He is alert and oriented to person, place, and time.  Psychiatric: Mood, memory, affect and judgment normal.  Nursing note and vitals reviewed.     Assessment & Plan  1. Annual physical exam Age appropriate laboratory screenings. - CBC with Differential - TSH - Vitamin D (25 hydroxy)   Jimmie Dattilio Asad A. [facility] 02/29/2016 9:21 AM

## 2016-03-01 LAB — VITAMIN D 25 HYDROXY (VIT D DEFICIENCY, FRACTURES): Vit D, 25-Hydroxy: 37 ng/mL (ref 30–100)

## 2016-03-05 ENCOUNTER — Other Ambulatory Visit: Payer: Self-pay | Admitting: Family Medicine

## 2016-03-30 ENCOUNTER — Other Ambulatory Visit: Payer: Self-pay | Admitting: Family Medicine

## 2016-03-30 DIAGNOSIS — E118 Type 2 diabetes mellitus with unspecified complications: Secondary | ICD-10-CM

## 2016-04-02 DIAGNOSIS — J018 Other acute sinusitis: Secondary | ICD-10-CM | POA: Diagnosis not present

## 2016-04-02 DIAGNOSIS — R0982 Postnasal drip: Secondary | ICD-10-CM | POA: Diagnosis not present

## 2016-04-09 ENCOUNTER — Other Ambulatory Visit: Payer: Self-pay | Admitting: Family Medicine

## 2016-04-09 DIAGNOSIS — E118 Type 2 diabetes mellitus with unspecified complications: Secondary | ICD-10-CM

## 2016-04-13 ENCOUNTER — Ambulatory Visit: Payer: 59 | Admitting: Family Medicine

## 2016-04-16 ENCOUNTER — Other Ambulatory Visit: Payer: Self-pay | Admitting: Family Medicine

## 2016-04-20 ENCOUNTER — Encounter: Payer: Self-pay | Admitting: Family Medicine

## 2016-04-20 ENCOUNTER — Ambulatory Visit (INDEPENDENT_AMBULATORY_CARE_PROVIDER_SITE_OTHER): Payer: 59 | Admitting: Family Medicine

## 2016-04-20 VITALS — BP 122/77 | HR 78 | Temp 98.2°F | Resp 16 | Ht 72.0 in | Wt 245.7 lb

## 2016-04-20 DIAGNOSIS — I10 Essential (primary) hypertension: Secondary | ICD-10-CM

## 2016-04-20 DIAGNOSIS — E118 Type 2 diabetes mellitus with unspecified complications: Secondary | ICD-10-CM | POA: Diagnosis not present

## 2016-04-20 DIAGNOSIS — E785 Hyperlipidemia, unspecified: Secondary | ICD-10-CM | POA: Diagnosis not present

## 2016-04-20 DIAGNOSIS — J0101 Acute recurrent maxillary sinusitis: Secondary | ICD-10-CM

## 2016-04-20 DIAGNOSIS — J0100 Acute maxillary sinusitis, unspecified: Secondary | ICD-10-CM | POA: Insufficient documentation

## 2016-04-20 LAB — GLUCOSE, POCT (MANUAL RESULT ENTRY): POC Glucose: 206 mg/dl — AB (ref 70–99)

## 2016-04-20 LAB — POCT GLYCOSYLATED HEMOGLOBIN (HGB A1C): Hemoglobin A1C: 7.7

## 2016-04-20 MED ORDER — FLUTICASONE PROPIONATE 50 MCG/ACT NA SUSP: 2.0000 | Freq: Every day | NASAL | 0 refills | Status: DC

## 2016-04-20 NOTE — Progress Notes (Signed)
Name: Karl Morgan   MRN: [id number]    DOB: 08-30-1960   Date:04/20/2016       Progress Note  Subjective  Chief Complaint  Chief Complaint  Patient presents with  . Follow-up    3 mo    Diabetes  He presents for his follow-up diabetic visit. He has type 2 diabetes mellitus. His disease course has been worsening. Pertinent negatives for hypoglycemia include no headaches. Pertinent negatives for diabetes include no blurred vision, no chest pain, no fatigue, no polydipsia and no polyuria. Diabetic complications include a CVA. Current diabetic treatment includes oral agent (triple therapy). He is following a generally healthy (has had a lot of sweets over the holidays.) diet. An ACE inhibitor/angiotensin II receptor blocker is being taken.  Hyperlipidemia  This is a chronic problem. The problem is uncontrolled. Recent lipid tests were reviewed and are high (Goal LDL< 70). Exacerbating diseases include diabetes and obesity. He has no history of nephrotic syndrome. Pertinent negatives include no chest pain, myalgias or shortness of breath. Current antihyperlipidemic treatment includes statins.  Hypertension  This is a chronic problem. The problem is unchanged. The problem is controlled. Pertinent negatives include no blurred vision, chest pain, headaches, palpitations or shortness of breath. Past treatments include beta blockers and ACE inhibitors. Hypertensive end-organ damage includes CAD/MI and CVA. There is no history of kidney disease.    Past Medical History:  Diagnosis Date  . Alopecia 04/04/2011  . Diabetes mellitus   . Erectile dysfunction 04/04/2011  . Heart murmur   . Hyperlipidemia 04/04/2011  . Hypogonadism male 04/04/2011  . Myocardial infarction   . Stroke ([place])   . Substance abuse   . Type II or unspecified type diabetes mellitus without mention of complication, uncontrolled 04/04/2011  . Unspecified essential hypertension 04/04/2011    Past Surgical History:  Procedure Laterality  Date  . CARDIAC CATHETERIZATION      Family History  Problem Relation Age of Onset  . Hypertension Father   . Dementia Father   . Emphysema Mother   . Cancer Neg Hx   . Diabetes Neg Hx   . Heart disease Neg Hx   . Stroke Neg Hx     Social History   Social History  . Marital status: Married    Spouse name: N/A  . Number of children: N/A  . Years of education: N/A   Occupational History  . OPERATIONS MANAGER of maintenance    Social History Main Topics  . Smoking status: Never Smoker  . Smokeless tobacco: Never Used  . Alcohol use No  . Drug use: No  . Sexual activity: Yes   Other Topics Concern  . Not on file   Social History Narrative   Caffienated drinks-no   Seat belt use often-yes   Regular Exercise-no   Smoke alarm in the home-yes   Firearms/guns in the home-yes   History of physical abuse-no                 Current Outpatient Prescriptions:  .  Alprostadil, Vasodilator, (EDEX IC), 1 Dose by Intracavernosal route as needed (for erectile dysfunstion). Pt is unsure of the dose., Disp: , Rfl:  .  aspirin EC 81 MG tablet, Take 81 mg by mouth daily., Disp: , Rfl:  .  atorvastatin (LIPITOR) 80 MG tablet, TAKE 1 TABLET BY MOUTH ONCE DAILY, Disp: 30 tablet, Rfl: 2 .  clopidogrel (PLAVIX) 75 MG tablet, Take 75 mg by mouth daily., Disp: , Rfl:  .  glimepiride (AMARYL) 4 MG tablet, TAKE 1 TABLET (4 MG TOTAL) BY MOUTH DAILY., Disp: 90 tablet, Rfl: 0 .  JANUVIA 100 MG tablet, TAKE 1 TABLET (100 MG TOTAL) BY MOUTH DAILY., Disp: 90 tablet, Rfl: 0 .  lisinopril (PRINIVIL,ZESTRIL) 5 MG tablet, TAKE 1 TABLET BY MOUTH ONCE DAILY, Disp: 30 tablet, Rfl: 4 .  metFORMIN (GLUCOPHAGE) 850 MG tablet, TAKE 1 TABLET BY MOUTH 2 TIMES DAILY WITH A MEAL, Disp: 180 tablet, Rfl: 0 .  metoprolol succinate (TOPROL-XL) 25 MG 24 hr tablet, TAKE 1 TABLET BY MOUTH ONCE DAILY, Disp: 30 tablet, Rfl: 2 .  sildenafil (VIAGRA) 100 MG tablet, Take 0.5-1 tablets (50-100 mg total) by  mouth daily as needed for erectile dysfunction., Disp: 5 tablet, Rfl: 11 .  testosterone cypionate (DEPOTESTOSTERONE CYPIONATE) 200 MG/ML injection, Inject 200 mg into the muscle every 7 (seven) days. Pt uses on Saturday., Disp: , Rfl:  .  valACYclovir (VALTREX) 1000 MG tablet, TAKE 1 TABLET BY MOUTH TWICE DAILY, Disp: 30 tablet, Rfl: 0  Current Facility-Administered Medications:  .  testosterone cypionate (DEPOTESTOTERONE CYPIONATE) injection 200 mg, 200 mg, Intramuscular, Q14 Days, Janith Lima, MD, 200 mg at 07/29/12 1635  Allergies  Allergen Reactions  . Pravachol [Pravastatin Sodium] Other (See Comments)    Reaction:  Muscle aches      Review of Systems  Constitutional: Negative for fatigue.  Eyes: Negative for blurred vision.  Respiratory: Negative for shortness of breath.   Cardiovascular: Negative for chest pain and palpitations.  Musculoskeletal: Negative for myalgias.  Neurological: Negative for headaches.  Endo/Heme/Allergies: Negative for polydipsia.    Objective  Vitals:   04/20/16 1015  BP: 122/77  Pulse: 78  Resp: 16  Temp: 98.2 F (36.8 C)  TempSrc: Oral  SpO2: 97%  Weight: 245 lb 11.2 oz (111.4 kg)  Height: 6' (1.829 m)    Physical Exam  Constitutional: He is oriented to person, place, and time and well-developed, well-nourished, and in no distress.  HENT:  Head: Normocephalic and atraumatic.  Nose: Right sinus exhibits no maxillary sinus tenderness. Left sinus exhibits no maxillary sinus tenderness.  Nasal turbinate hypertrophy, mucosal inflammation.   Cardiovascular: Normal rate, regular rhythm and normal heart sounds.   No murmur heard. Pulmonary/Chest: Effort normal and breath sounds normal. He has no wheezes.  Abdominal: Soft. Bowel sounds are normal. There is no tenderness.  Neurological: He is alert and oriented to person, place, and time.  Psychiatric: Mood, memory, affect and judgment normal.  Nursing note and vitals  reviewed.      Assessment & Plan  1. Controlled type 2 diabetes mellitus with complication, without long-term current use of insulin (HCC) Point-of-care A1c is 7.7%, worse due to eating sweets over the holidays etc, sometimes forgets to take the second dose of metformin. Reassess in 3 months. - POCT HgB A1C - POCT Glucose (CBG)  2. Essential hypertension BP stable on present anti-hypertensive therapy.  3. Hyperlipidemia, unspecified hyperlipidemia type LDL not at goal, repeat today, consider switching to Crestor 40 mg. - Lipid Profile - COMPLETE METABOLIC PANEL WITH GFR  4. Acute recurrent maxillary sinusitis Has been to Urgent Care and finished a course of Doxycycline, still has drainage and congestion with pressure over the maxillary sinuses. Recommended Flonase and referral to ENT if still experiencing persistent symptoms. - fluticasone (FLONASE) 50 MCG/ACT nasal spray; Place 2 sprays into both nostrils daily.  Dispense: 16 g; Refill: 0   Marvel Mcphillips Asad A. [facility] 04/20/2016 10:24 AM

## 2016-05-03 ENCOUNTER — Ambulatory Visit (INDEPENDENT_AMBULATORY_CARE_PROVIDER_SITE_OTHER): Payer: 59 | Admitting: Family Medicine

## 2016-05-03 ENCOUNTER — Encounter: Payer: Self-pay | Admitting: Family Medicine

## 2016-05-03 VITALS — BP 112/84 | HR 87 | Temp 98.1°F | Resp 16 | Ht 72.0 in | Wt 245.9 lb

## 2016-05-03 DIAGNOSIS — J0100 Acute maxillary sinusitis, unspecified: Secondary | ICD-10-CM | POA: Diagnosis not present

## 2016-05-03 MED ORDER — AZITHROMYCIN 250 MG PO TABS: ORAL_TABLET | ORAL | 0 refills | Status: DC

## 2016-05-03 NOTE — Progress Notes (Signed)
Name: Karl Morgan   MRN: [id number]    DOB: June 08, 1960   Date:05/03/2016       Progress Note  Subjective  Chief Complaint  Chief Complaint  Patient presents with  . URI    cough, congested, burning in chest, thick white phelgm    Sinusitis  This is a new problem. The problem is unchanged. There has been no fever. Associated symptoms include congestion, coughing, headaches and sinus pressure. Pertinent negatives include no sore throat. Treatments tried: has been taking Flonase, has finished 10-day course of Amoxicillin prescribed by Urgent Care in January.     Past Medical History:  Diagnosis Date  . Alopecia 04/04/2011  . Diabetes mellitus   . Erectile dysfunction 04/04/2011  . Heart murmur   . Hyperlipidemia 04/04/2011  . Hypogonadism male 04/04/2011  . Myocardial infarction   . Stroke ([place])   . Substance abuse   . Type II or unspecified type diabetes mellitus without mention of complication, uncontrolled 04/04/2011  . Unspecified essential hypertension 04/04/2011    Past Surgical History:  Procedure Laterality Date  . CARDIAC CATHETERIZATION      Family History  Problem Relation Age of Onset  . Hypertension Father   . Dementia Father   . Emphysema Mother   . Cancer Neg Hx   . Diabetes Neg Hx   . Heart disease Neg Hx   . Stroke Neg Hx     Social History   Social History  . Marital status: Married    Spouse name: N/A  . Number of children: N/A  . 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 Smoking status: Never Smoker  . Smokeless tobacco: Never Used  . Alcohol use No  . Drug use: No  . Sexual activity: Yes   Other Topics Concern  . Not on file   Social History Narrative   Caffienated drinks-no   Seat belt use often-yes   Regular Exercise-no   Smoke alarm in the home-yes   Firearms/guns in the home-yes   History of physical abuse-no                 Current  Outpatient Prescriptions:  .  Alprostadil, Vasodilator, (EDEX IC), 1 Dose by Intracavernosal route as needed (for erectile dysfunstion). Pt is unsure of the dose., Disp: , Rfl:  .  aspirin EC 81 MG tablet, Take 81 mg by mouth daily., Disp: , Rfl:  .  atorvastatin (LIPITOR) 80 MG tablet, TAKE 1 TABLET BY MOUTH ONCE DAILY, Disp: 30 tablet, Rfl: 2 .  clopidogrel (PLAVIX) 75 MG tablet, Take 75 mg by mouth daily., Disp: , Rfl:  .  fluticasone (FLONASE) 50 MCG/ACT nasal spray, Place 2 sprays into both nostrils daily., Disp: 16 g, Rfl: 0 .  glimepiride (AMARYL) 4 MG tablet, TAKE 1 TABLET (4 MG TOTAL) BY MOUTH DAILY., Disp: 90 tablet, Rfl: 0 .  JANUVIA 100 MG tablet, TAKE 1 TABLET (100 MG TOTAL) BY MOUTH DAILY., Disp: 90 tablet, Rfl: 0 .  lisinopril (PRINIVIL,ZESTRIL) 5 MG tablet, TAKE 1 TABLET BY MOUTH ONCE DAILY, Disp: 30 tablet, Rfl: 4 .  metFORMIN (GLUCOPHAGE) 850 MG tablet, TAKE 1 TABLET BY MOUTH 2 TIMES DAILY WITH A MEAL, Disp: 180 tablet, Rfl: 0 .  metoprolol succinate (TOPROL-XL) 25 MG 24 hr tablet, TAKE 1 TABLET BY MOUTH ONCE DAILY, Disp: 30 tablet, Rfl: 2 .  sildenafil (VIAGRA) 100 MG tablet,  Take 0.5-1 tablets (50-100 mg total) by mouth daily as needed for erectile dysfunction., Disp: 5 tablet, Rfl: 11 .  testosterone cypionate (DEPOTESTOSTERONE CYPIONATE) 200 MG/ML injection, Inject 200 mg into the muscle every 7 (seven) days. Pt uses on Saturday., Disp: , Rfl:  .  valACYclovir (VALTREX) 1000 MG tablet, TAKE 1 TABLET BY MOUTH TWICE DAILY, Disp: 30 tablet, Rfl: 0  Current Facility-Administered Medications:  .  testosterone cypionate (DEPOTESTOTERONE CYPIONATE) injection 200 mg, 200 mg, Intramuscular, Q14 Days, Janith Lima, MD, 200 mg at 07/29/12 1635  Allergies  Allergen Reactions  . Pravachol [Pravastatin Sodium] Other (See Comments)    Reaction:  Muscle aches      Review of Systems  HENT: Positive for congestion and sinus pressure. Negative for sore throat.   Respiratory: Positive  for cough.   Neurological: Positive for headaches.      Objective  Vitals:   05/03/16 1523  BP: 112/84  Pulse: 87  Resp: 16  Temp: 98.1 F (36.7 C)  TempSrc: Oral  SpO2: 97%  Weight: 245 lb 14.4 oz (111.5 kg)  Height: 6' (1.829 m)    Physical Exam  Constitutional: He is oriented to person, place, and time and well-developed, well-nourished, and in no distress.  HENT:  Head: Normocephalic and atraumatic.  Right Ear: Tympanic membrane and ear canal normal.  Left Ear: Tympanic membrane and ear canal normal.  Nose: Right sinus exhibits maxillary sinus tenderness. Left sinus exhibits maxillary sinus tenderness.  Mouth/Throat: Posterior oropharyngeal erythema present.  Nasal turbinate hypertrophy, mucosal inflammation.  Cardiovascular: Normal rate, regular rhythm, S1 normal, S2 normal and normal heart sounds.   No murmur heard. Pulmonary/Chest: Effort normal and breath sounds normal. He has no wheezes.  Neurological: He is alert and oriented to person, place, and time.  Nursing note and vitals reviewed.      Assessment & Plan  1. Acute non-recurrent maxillary sinusitis By symptoms and exam, patient has taken amoxicillin in the recent past. We will start on Z-Pak for treatment. Advised increased fluid intake. - azithromycin (ZITHROMAX) 250 MG tablet; 2 tabs po day 1, then 1 tab po q  day x 4 days  Dispense: 6 each; Refill: 0   Syed Asad A. [facility] 05/03/2016 3:46 PM

## 2016-05-21 DIAGNOSIS — L578 Other skin changes due to chronic exposure to nonionizing radiation: Secondary | ICD-10-CM | POA: Diagnosis not present

## 2016-05-21 DIAGNOSIS — L570 Actinic keratosis: Secondary | ICD-10-CM | POA: Diagnosis not present

## 2016-05-21 DIAGNOSIS — I10 Essential (primary) hypertension: Secondary | ICD-10-CM | POA: Diagnosis not present

## 2016-05-21 DIAGNOSIS — E119 Type 2 diabetes mellitus without complications: Secondary | ICD-10-CM | POA: Diagnosis not present

## 2016-05-21 DIAGNOSIS — D229 Melanocytic nevi, unspecified: Secondary | ICD-10-CM | POA: Diagnosis not present

## 2016-05-21 DIAGNOSIS — L812 Freckles: Secondary | ICD-10-CM | POA: Diagnosis not present

## 2016-05-21 DIAGNOSIS — Z1283 Encounter for screening for malignant neoplasm of skin: Secondary | ICD-10-CM | POA: Diagnosis not present

## 2016-05-21 DIAGNOSIS — L821 Other seborrheic keratosis: Secondary | ICD-10-CM | POA: Diagnosis not present

## 2016-06-12 ENCOUNTER — Other Ambulatory Visit: Payer: Self-pay | Admitting: Family Medicine

## 2016-06-19 ENCOUNTER — Other Ambulatory Visit: Payer: Self-pay | Admitting: Family Medicine

## 2016-06-19 ENCOUNTER — Encounter: Payer: Self-pay | Admitting: Family Medicine

## 2016-06-19 DIAGNOSIS — E118 Type 2 diabetes mellitus with unspecified complications: Secondary | ICD-10-CM

## 2016-06-25 ENCOUNTER — Other Ambulatory Visit
Admission: RE | Admit: 2016-06-25 | Discharge: 2016-06-25 | Disposition: A | Discharge status: Home / Self Care | Payer: 59 | Source: Ambulatory Visit | Attending: Family Medicine | Admitting: Family Medicine

## 2016-06-25 DIAGNOSIS — E785 Hyperlipidemia, unspecified: Secondary | ICD-10-CM | POA: Diagnosis not present

## 2016-06-25 LAB — COMPREHENSIVE METABOLIC PANEL
ALT: 35 U/L (ref 17–63)
AST: 28 U/L (ref 15–41)
Albumin: 4 g/dL (ref 3.5–5.0)
Alkaline Phosphatase: 62 U/L (ref 38–126)
Anion gap: 7 (ref 5–15)
BUN: 14 mg/dL (ref 6–20)
CO2: 23 mmol/L (ref 22–32)
Calcium: 8.9 mg/dL (ref 8.9–10.3)
Chloride: 106 mmol/L (ref 101–111)
Creatinine, Ser: 1.15 mg/dL (ref 0.61–1.24)
GFR calc Af Amer: >60 mL/min (ref >60)
GFR calc non Af Amer: >60 mL/min (ref >60)
Glucose, Bld: 174 mg/dL — ABNORMAL HIGH (ref 65–99)
Potassium: 4.1 mmol/L (ref 3.5–5.1)
Sodium: 136 mmol/L (ref 135–145)
Total Bilirubin: 1.1 mg/dL (ref 0.3–1.2)
Total Protein: 6.6 g/dL (ref 6.5–8.1)

## 2016-06-25 LAB — LIPID PANEL
Cholesterol: 122 mg/dL (ref 0–200)
HDL: 28 mg/dL — ABNORMAL LOW (ref >40)
LDL Cholesterol: 74 mg/dL (ref 0–99)
Total CHOL/HDL Ratio: 4.4 RATIO
Triglycerides: 98 mg/dL (ref <150)
VLDL: 20 mg/dL (ref 0–40)

## 2016-07-09 ENCOUNTER — Other Ambulatory Visit: Payer: Self-pay | Admitting: Family Medicine

## 2016-07-09 DIAGNOSIS — E118 Type 2 diabetes mellitus with unspecified complications: Secondary | ICD-10-CM

## 2016-07-17 ENCOUNTER — Encounter: Payer: Self-pay | Admitting: Family Medicine

## 2016-07-17 ENCOUNTER — Ambulatory Visit (INDEPENDENT_AMBULATORY_CARE_PROVIDER_SITE_OTHER): Payer: 59 | Admitting: Family Medicine

## 2016-07-17 VITALS — BP 115/80 | HR 86 | Temp 98.0°F | Resp 17 | Ht 72.0 in | Wt 247.9 lb

## 2016-07-17 DIAGNOSIS — E785 Hyperlipidemia, unspecified: Secondary | ICD-10-CM | POA: Diagnosis not present

## 2016-07-17 DIAGNOSIS — I10 Essential (primary) hypertension: Secondary | ICD-10-CM | POA: Diagnosis not present

## 2016-07-17 DIAGNOSIS — E118 Type 2 diabetes mellitus with unspecified complications: Secondary | ICD-10-CM | POA: Diagnosis not present

## 2016-07-17 LAB — POCT GLYCOSYLATED HEMOGLOBIN (HGB A1C): Hemoglobin A1C: 8.2

## 2016-07-17 NOTE — Progress Notes (Signed)
Name: Karl Morgan   MRN: [id number]    DOB: 1960/07/05   Date:07/17/2016       Progress Note  Subjective  Chief Complaint  Chief Complaint  Patient presents with  . Follow-up    3 mo    Diabetes  He presents for his follow-up diabetic visit. He has type 2 diabetes mellitus. His disease course has been worsening. Pertinent negatives for hypoglycemia include no headaches. Pertinent negatives for diabetes include no blurred vision, no chest pain, no fatigue, no polydipsia and no polyuria. Diabetic complications include a CVA and heart disease. Current diabetic treatment includes oral agent (triple therapy). He is following a generally healthy (has had a lot of sweets over the last few days and weeks in addition to other calorie rich foods) diet. He monitors blood glucose at home 1-2 x per week. His breakfast blood glucose range is generally 140-180 mg/dl. An ACE inhibitor/angiotensin II receptor blocker is being taken.  Hyperlipidemia  This is a chronic problem. The problem is uncontrolled. Recent lipid tests were reviewed and are high (Goal LDL< 70). Exacerbating diseases include diabetes and obesity. He has no history of nephrotic syndrome. Pertinent negatives include no chest pain, myalgias or shortness of breath. Current antihyperlipidemic treatment includes statins.  Hypertension  This is a chronic problem. The problem is unchanged. The problem is controlled. Pertinent negatives include no blurred vision, chest pain, headaches, palpitations or shortness of breath. Past treatments include beta blockers and ACE inhibitors. Hypertensive end-organ damage includes CAD/MI and CVA. There is no history of kidney disease.     Past Medical History:  Diagnosis Date  . Alopecia 04/04/2011  . Diabetes mellitus   . Erectile dysfunction 04/04/2011  . Heart murmur   . Hyperlipidemia 04/04/2011  . Hypogonadism male 04/04/2011  . Myocardial infarction ([place])   . Stroke ([place])   . Substance abuse   . Type II  or unspecified type diabetes mellitus without mention of complication, uncontrolled 04/04/2011  . Unspecified essential hypertension 04/04/2011    Past Surgical History:  Procedure Laterality Date  . CARDIAC CATHETERIZATION      Family History  Problem Relation Age of Onset  . Hypertension Father   . Dementia Father   . Emphysema Mother   . Cancer Neg Hx   . Diabetes Neg Hx   . Heart disease Neg Hx   . Stroke Neg Hx     Social History   Social History  . Marital status: Married    Spouse name: N/A  . Number of children: N/A  . Years of education: N/A   Occupational History  . OPERATIONS MANAGER of maintenance [employer]   Social History Main Topics  . Smoking status: Never Smoker  . Smokeless tobacco: Never Used  . Alcohol use No  . Drug use: No  . Sexual activity: Yes   Other Topics Concern  . Not on file   Social History Narrative   Caffienated drinks-no   Seat belt use often-yes   Regular Exercise-no   Smoke alarm in the home-yes   Firearms/guns in the home-yes   History of physical abuse-no                 Current Outpatient Prescriptions:  .  Alprostadil, Vasodilator, (EDEX IC), 1 Dose by Intracavernosal route as needed (for erectile dysfunstion). Pt is unsure of the dose., Disp: , Rfl:  .  aspirin EC 81 MG tablet, Take 81 mg by mouth daily., Disp: , Rfl:  .  atorvastatin (LIPITOR) 80 MG tablet, TAKE 1 TABLET BY MOUTH ONCE DAILY, Disp: 30 tablet, Rfl: 2 .  clopidogrel (PLAVIX) 75 MG tablet, Take 75 mg by mouth daily., Disp: , Rfl:  .  glimepiride (AMARYL) 4 MG tablet, TAKE 1 TABLET (4 MG TOTAL) BY MOUTH DAILY., Disp: 90 tablet, Rfl: 0 .  JANUVIA 100 MG tablet, TAKE 1 TABLET (100 MG TOTAL) BY MOUTH DAILY., Disp: 90 tablet, Rfl: 0 .  lisinopril (PRINIVIL,ZESTRIL) 5 MG tablet, TAKE 1 TABLET BY MOUTH ONCE DAILY, Disp: 30 tablet, Rfl: 4 .  metFORMIN (GLUCOPHAGE) 850 MG tablet, TAKE 1 TABLET BY MOUTH 2 TIMES DAILY WITH A MEAL, Disp: 180 tablet, Rfl: 0 .   metoprolol succinate (TOPROL-XL) 25 MG 24 hr tablet, TAKE 1 TABLET BY MOUTH ONCE DAILY, Disp: 30 tablet, Rfl: 2 .  sildenafil (VIAGRA) 100 MG tablet, Take 0.5-1 tablets (50-100 mg total) by mouth daily as needed for erectile dysfunction., Disp: 5 tablet, Rfl: 11 .  testosterone cypionate (DEPOTESTOSTERONE CYPIONATE) 200 MG/ML injection, Inject 200 mg into the muscle every 7 (seven) days. Pt uses on Saturday., Disp: , Rfl:  .  valACYclovir (VALTREX) 1000 MG tablet, TAKE 1 TABLET BY MOUTH TWICE DAILY, Disp: 30 tablet, Rfl: 0 .  azithromycin (ZITHROMAX) 250 MG tablet, 2 tabs po day 1, then 1 tab po q  day x 4 days (Patient not taking: Reported on 07/17/2016), Disp: 6 each, Rfl: 0 .  fluticasone (FLONASE) 50 MCG/ACT nasal spray, Place 2 sprays into both nostrils daily. (Patient not taking: Reported on 07/17/2016), Disp: 16 g, Rfl: 0  Current Facility-Administered Medications:  .  testosterone cypionate (DEPOTESTOTERONE CYPIONATE) injection 200 mg, 200 mg, Intramuscular, Q14 Days, Janith Lima, MD, 200 mg at 07/29/12 1635  Allergies  Allergen Reactions  . Pravachol [Pravastatin Sodium] Other (See Comments)    Reaction:  Muscle aches      Review of Systems  Constitutional: Negative for fatigue.  Eyes: Negative for blurred vision.  Respiratory: Negative for shortness of breath.   Cardiovascular: Negative for chest pain and palpitations.  Musculoskeletal: Negative for myalgias.  Neurological: Negative for headaches.  Endo/Heme/Allergies: Negative for polydipsia.     Objective  Vitals:   07/17/16 1537  BP: 115/80  Pulse: 86  Resp: 17  Temp: 98 F (36.7 C)  TempSrc: Oral  SpO2: 96%  Weight: 247 lb 14.4 oz (112.4 kg)  Height: 6' (1.829 m)    Physical Exam  Constitutional: He is oriented to person, place, and time and well-developed, well-nourished, and in no distress.  HENT:  Head: Normocephalic and atraumatic.  Cardiovascular: Normal rate, regular rhythm and normal heart sounds.    No murmur heard. Pulmonary/Chest: Effort normal and breath sounds normal. He has no wheezes.  Abdominal: Soft. Bowel sounds are normal. There is no tenderness.  Neurological: He is alert and oriented to person, place, and time.  Psychiatric: Mood, memory, affect and judgment normal.  Nursing note and vitals reviewed.      Assessment & Plan  1. Controlled type 2 diabetes mellitus with complication, without long-term current use of insulin (HCC) Point-of-care A1c is 8.2%, poorly controlled to the patient advised to modify dietary and lifestyle to lower carbohydrate consumption, increase physical activity, recheck in 3 months - POCT HgB A1C  2. Hyperlipidemia, unspecified hyperlipidemia type Elevated LDL cholesterol over 70, advised to improve dietary and lifestyle factors, decrease consumption of high-cholesterol foods. Increase physical activity  3. Essential hypertension BP stable on present antihypertensive therapy   Dossie Der  Asad A[facility] 07/17/2016 3:57 PM

## 2016-07-19 ENCOUNTER — Ambulatory Visit: Payer: 59 | Admitting: Family Medicine

## 2016-08-08 DIAGNOSIS — N5201 Erectile dysfunction due to arterial insufficiency: Secondary | ICD-10-CM | POA: Diagnosis not present

## 2016-08-08 DIAGNOSIS — E291 Testicular hypofunction: Secondary | ICD-10-CM | POA: Diagnosis not present

## 2016-08-08 DIAGNOSIS — Z125 Encounter for screening for malignant neoplasm of prostate: Secondary | ICD-10-CM | POA: Diagnosis not present

## 2016-09-04 ENCOUNTER — Other Ambulatory Visit: Payer: Self-pay | Admitting: Family Medicine

## 2016-09-10 ENCOUNTER — Other Ambulatory Visit: Payer: Self-pay | Admitting: Family Medicine

## 2016-09-10 DIAGNOSIS — E118 Type 2 diabetes mellitus with unspecified complications: Secondary | ICD-10-CM

## 2016-09-17 DIAGNOSIS — I2510 Atherosclerotic heart disease of native coronary artery without angina pectoris: Secondary | ICD-10-CM | POA: Diagnosis not present

## 2016-09-17 DIAGNOSIS — E669 Obesity, unspecified: Secondary | ICD-10-CM | POA: Diagnosis not present

## 2016-09-17 DIAGNOSIS — I209 Angina pectoris, unspecified: Secondary | ICD-10-CM | POA: Diagnosis not present

## 2016-09-17 DIAGNOSIS — I10 Essential (primary) hypertension: Secondary | ICD-10-CM | POA: Diagnosis not present

## 2016-10-10 ENCOUNTER — Telehealth: Payer: Self-pay | Admitting: Family Medicine

## 2016-10-10 DIAGNOSIS — E118 Type 2 diabetes mellitus with unspecified complications: Secondary | ICD-10-CM

## 2016-10-10 NOTE — Telephone Encounter (Signed)
Pt needs refill on Januvia. He is out. [facility] pharmacy.

## 2016-10-11 MED ORDER — SITAGLIPTIN PHOSPHATE 100 MG PO TABS: 100.0000 mg | ORAL_TABLET | Freq: Every day | ORAL | 0 refills | Status: DC

## 2016-10-11 NOTE — Telephone Encounter (Signed)
Prescription for Januvia is sent to patient's pharmacy

## 2016-10-11 NOTE — Telephone Encounter (Signed)
LMOM to inform pt refill at pharmacy

## 2016-10-16 ENCOUNTER — Ambulatory Visit (INDEPENDENT_AMBULATORY_CARE_PROVIDER_SITE_OTHER): Payer: 59 | Admitting: Family Medicine

## 2016-10-16 ENCOUNTER — Encounter: Payer: Self-pay | Admitting: Family Medicine

## 2016-10-16 ENCOUNTER — Ambulatory Visit: Payer: 59 | Admitting: Family Medicine

## 2016-10-16 VITALS — BP 138/81 | HR 76 | Temp 98.3°F | Resp 16 | Ht 72.0 in | Wt 240.2 lb

## 2016-10-16 DIAGNOSIS — E118 Type 2 diabetes mellitus with unspecified complications: Secondary | ICD-10-CM | POA: Diagnosis not present

## 2016-10-16 DIAGNOSIS — I10 Essential (primary) hypertension: Secondary | ICD-10-CM | POA: Diagnosis not present

## 2016-10-16 DIAGNOSIS — E7800 Pure hypercholesterolemia, unspecified: Secondary | ICD-10-CM

## 2016-10-16 LAB — POCT GLYCOSYLATED HEMOGLOBIN (HGB A1C): Hemoglobin A1C: 7.1

## 2016-10-16 MED ORDER — ATORVASTATIN CALCIUM 80 MG PO TABS: 80.0000 mg | ORAL_TABLET | Freq: Every day | ORAL | 1 refills | Status: DC

## 2016-10-16 MED ORDER — METFORMIN HCL 850 MG PO TABS: 850.0000 mg | ORAL_TABLET | Freq: Every day | ORAL | 0 refills | Status: DC

## 2016-10-16 MED ORDER — METOPROLOL SUCCINATE ER 25 MG PO TB24: 25.0000 mg | ORAL_TABLET | Freq: Every day | ORAL | 0 refills | Status: DC

## 2016-10-16 NOTE — Progress Notes (Signed)
Name: Karl Morgan   MRN: [id number]    DOB: 1960-07-17   Date:10/16/2016       Progress Note  Subjective  Chief Complaint  Chief Complaint  Patient presents with  . Follow-up    3 mo    Diabetes  He presents for his follow-up diabetic visit. He has type 2 diabetes mellitus. His disease course has been stable. Pertinent negatives for hypoglycemia include no headaches. Pertinent negatives for diabetes include no blurred vision, no chest pain, no fatigue, no foot paresthesias, no polydipsia and no polyuria. Diabetic complications include a CVA and heart disease. Current diabetic treatment includes oral agent (triple therapy). He is following a generally healthy and diabetic diet. Frequency home blood tests: does not check blood glucose  An ACE inhibitor/angiotensin II receptor blocker is being taken.  Hyperlipidemia  This is a chronic problem. The problem is uncontrolled. Recent lipid tests were reviewed and are normal (Goal LDL< 70). Exacerbating diseases include diabetes and obesity. He has no history of nephrotic syndrome. Pertinent negatives include no chest pain, myalgias or shortness of breath. Current antihyperlipidemic treatment includes statins.  Hypertension  This is a chronic problem. The problem is unchanged. The problem is controlled. Pertinent negatives include no blurred vision, chest pain, headaches, palpitations or shortness of breath. Past treatments include beta blockers and ACE inhibitors. Hypertensive end-organ damage includes CAD/MI and CVA. There is no history of kidney disease.    Past Medical History:  Diagnosis Date  . Alopecia 04/04/2011  . Diabetes mellitus   . Erectile dysfunction 04/04/2011  . Heart murmur   . Hyperlipidemia 04/04/2011  . Hypogonadism male 04/04/2011  . Myocardial infarction ([place])   . Stroke ([place])   . Substance abuse   . Type II or unspecified type diabetes mellitus without mention of complication, uncontrolled 04/04/2011  . Unspecified essential  hypertension 04/04/2011    Past Surgical History:  Procedure Laterality Date  . CARDIAC CATHETERIZATION      Family History  Problem Relation Age of Onset  . Hypertension Father   . Dementia Father   . Emphysema Mother   . Cancer Neg Hx   . Diabetes Neg Hx   . Heart disease Neg Hx   . Stroke Neg Hx     Social History   Social History  . Marital status: Married    Spouse name: N/A  . Number of children: N/A  . Years of education: N/A   Occupational History  . 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 Smoking status: Never Smoker  . Smokeless tobacco: Never Used  . Alcohol use No  . Drug use: No  . Sexual activity: Yes   Other Topics Concern  . Not on file   Social History Narrative   Caffienated drinks-no   Seat belt use often-yes   Regular Exercise-no   Smoke alarm in the home-yes   Firearms/guns in the home-yes   History of physical abuse-no                 Current Outpatient Prescriptions:  .  Alprostadil, Vasodilator, (EDEX IC), 1 Dose by Intracavernosal route as needed (for erectile dysfunstion). Pt is unsure of the dose., Disp: , Rfl:  .  aspirin EC 81 MG tablet, Take 81 mg by mouth daily., Disp: , Rfl:  .  atorvastatin (LIPITOR) 80 MG tablet, TAKE 1 TABLET BY MOUTH ONCE DAILY, Disp: 30 tablet, Rfl: 2 .  clopidogrel (PLAVIX) 75 MG tablet,  Take 75 mg by mouth daily., Disp: , Rfl:  .  glimepiride (AMARYL) 4 MG tablet, TAKE 1 TABLET (4 MG TOTAL) BY MOUTH DAILY., Disp: 90 tablet, Rfl: 0 .  lisinopril (PRINIVIL,ZESTRIL) 5 MG tablet, TAKE 1 TABLET BY MOUTH ONCE DAILY, Disp: 90 tablet, Rfl: 0 .  metFORMIN (GLUCOPHAGE) 850 MG tablet, TAKE 1 TABLET BY MOUTH 2 TIMES DAILY WITH A MEAL, Disp: 180 tablet, Rfl: 0 .  metoprolol succinate (TOPROL-XL) 25 MG 24 hr tablet, TAKE 1 TABLET BY MOUTH ONCE DAILY, Disp: 30 tablet, Rfl: 2 .  sildenafil (VIAGRA) 100 MG tablet, Take 0.5-1 tablets (50-100 mg total) by mouth daily as needed for  erectile dysfunction., Disp: 5 tablet, Rfl: 11 .  sitaGLIPtin (JANUVIA) 100 MG tablet, Take 1 tablet (100 mg total) by mouth daily., Disp: 90 tablet, Rfl: 0 .  testosterone cypionate (DEPOTESTOSTERONE CYPIONATE) 200 MG/ML injection, Inject 200 mg into the muscle every 7 (seven) days. Pt uses on Saturday., Disp: , Rfl:  .  valACYclovir (VALTREX) 1000 MG tablet, TAKE 1 TABLET BY MOUTH TWICE DAILY, Disp: 30 tablet, Rfl: 0  Current Facility-Administered Medications:  .  testosterone cypionate (DEPOTESTOTERONE CYPIONATE) injection 200 mg, 200 mg, Intramuscular, Q14 Days, Janith Lima, MD, 200 mg at 07/29/12 1635  Allergies  Allergen Reactions  . Pravachol [Pravastatin Sodium] Other (See Comments)    Reaction:  Muscle aches      Review of Systems  Constitutional: Negative for fatigue.  Eyes: Negative for blurred vision.  Respiratory: Negative for shortness of breath.   Cardiovascular: Negative for chest pain and palpitations.  Musculoskeletal: Negative for myalgias.  Neurological: Negative for headaches.  Endo/Heme/Allergies: Negative for polydipsia.     Objective  Vitals:   10/16/16 0950  BP: 138/81  Pulse: 76  Resp: 16  Temp: 98.3 F (36.8 C)  TempSrc: Oral  SpO2: 96%  Weight: 240 lb 3.2 oz (109 kg)  Height: 6' (1.829 m)    Physical Exam  Constitutional: He is oriented to person, place, and time and well-developed, well-nourished, and in no distress.  HENT:  Head: Normocephalic and atraumatic.  Cardiovascular: Normal rate, regular rhythm and normal heart sounds.   No murmur heard. Pulmonary/Chest: Effort normal and breath sounds normal. He has no wheezes.  Abdominal: Soft. Bowel sounds are normal. There is no tenderness.  Neurological: He is alert and oriented to person, place, and time.  Psychiatric: Mood, memory, affect and judgment normal.  Nursing note and vitals reviewed.     Recent Results (from the past 2160 hour(s))  POCT HgB A1C     Status: Abnormal    Collection Time: 10/16/16 10:05 AM  Result Value Ref Range   Hemoglobin A1C 7.1      Assessment & Plan  1. Controlled type 2 diabetes mellitus with complication, without long-term current use of insulin (HCC) Point-of-care A1c 7.1%, well-controlled diabetes, change metformin to 850 mg daily - POCT HgB A1C - metFORMIN (GLUCOPHAGE) 850 MG tablet; Take 1 tablet (850 mg total) by mouth daily with breakfast.  Dispense: 90 tablet; Refill: 0  2. Essential hypertension BP stable on present hypertensive therapy - metoprolol succinate (TOPROL-XL) 25 MG 24 hr tablet; Take 1 tablet (25 mg total) by mouth daily.  Dispense: 90 tablet; Refill: 0  3. Pure hypercholesterolemia  - atorvastatin (LIPITOR) 80 MG tablet; Take 1 tablet (80 mg total) by mouth daily.  Dispense: 90 tablet; Refill: 1   Tyreanna Bisesi Asad A. [facility] 10/16/2016 10:09  AM

## 2016-12-11 ENCOUNTER — Other Ambulatory Visit: Payer: Self-pay | Admitting: Family Medicine

## 2016-12-25 ENCOUNTER — Other Ambulatory Visit: Payer: Self-pay | Admitting: Family Medicine

## 2016-12-25 DIAGNOSIS — E118 Type 2 diabetes mellitus with unspecified complications: Secondary | ICD-10-CM

## 2017-01-07 ENCOUNTER — Other Ambulatory Visit: Payer: Self-pay | Admitting: Family Medicine

## 2017-01-07 DIAGNOSIS — E118 Type 2 diabetes mellitus with unspecified complications: Secondary | ICD-10-CM

## 2017-01-17 ENCOUNTER — Other Ambulatory Visit: Payer: Self-pay | Admitting: Family Medicine

## 2017-01-17 DIAGNOSIS — I10 Essential (primary) hypertension: Secondary | ICD-10-CM

## 2017-01-28 ENCOUNTER — Other Ambulatory Visit: Payer: Self-pay | Admitting: Family Medicine

## 2017-01-28 DIAGNOSIS — E118 Type 2 diabetes mellitus with unspecified complications: Secondary | ICD-10-CM

## 2017-02-18 ENCOUNTER — Ambulatory Visit: Payer: 59 | Admitting: Family Medicine

## 2017-02-25 ENCOUNTER — Ambulatory Visit (INDEPENDENT_AMBULATORY_CARE_PROVIDER_SITE_OTHER): Payer: 59 | Admitting: Family Medicine

## 2017-02-25 ENCOUNTER — Encounter: Payer: Self-pay | Admitting: Family Medicine

## 2017-02-25 VITALS — BP 122/78 | HR 74 | Temp 98.4°F | Resp 14 | Wt 238.6 lb

## 2017-02-25 DIAGNOSIS — E118 Type 2 diabetes mellitus with unspecified complications: Secondary | ICD-10-CM

## 2017-02-25 DIAGNOSIS — E7800 Pure hypercholesterolemia, unspecified: Secondary | ICD-10-CM

## 2017-02-25 DIAGNOSIS — I10 Essential (primary) hypertension: Secondary | ICD-10-CM | POA: Diagnosis not present

## 2017-02-25 LAB — POCT GLYCOSYLATED HEMOGLOBIN (HGB A1C): Hemoglobin A1C: 6.7

## 2017-02-25 MED ORDER — GLIMEPIRIDE 2 MG PO TABS: 2.0000 mg | ORAL_TABLET | Freq: Every day | ORAL | 1 refills | Status: DC

## 2017-02-25 NOTE — Progress Notes (Signed)
Name: Karl Morgan   MRN: [id number]    DOB: 1960/04/09   Date:02/25/2017       Progress Note  Subjective  Chief Complaint  Chief Complaint  Patient presents with  . Diabetes    f/u    Diabetes  He presents for his follow-up diabetic visit. He has type 2 diabetes mellitus. His disease course has been improving. Hypoglycemia symptoms include dizziness, nervousness/anxiousness and sweats. Pertinent negatives for hypoglycemia include no headaches. Pertinent negatives for diabetes include no blurred vision, no chest pain, no fatigue, no polydipsia and no polyuria. Diabetic complications include a CVA and heart disease. Current diabetic treatment includes oral agent (triple therapy). He is following a generally healthy diet. His breakfast blood glucose range is generally 140-180 mg/dl. An ACE inhibitor/angiotensin II receptor blocker is being taken.  Hyperlipidemia  This is a chronic problem. The problem is uncontrolled. Recent lipid tests were reviewed and are high (Goal LDL< 70). Exacerbating diseases include diabetes and obesity. He has no history of nephrotic syndrome. Pertinent negatives include no chest pain, myalgias or shortness of breath. Current antihyperlipidemic treatment includes statins.  Hypertension  This is a chronic problem. The problem is unchanged. The problem is controlled. Associated symptoms include sweats. Pertinent negatives include no blurred vision, chest pain, headaches, palpitations or shortness of breath. Past treatments include beta blockers and ACE inhibitors. Hypertensive end-organ damage includes CAD/MI and CVA. There is no history of kidney disease.     Past Medical History:  Diagnosis Date  . Alopecia 04/04/2011  . Diabetes mellitus   . Erectile dysfunction 04/04/2011  . Heart murmur   . Hyperlipidemia 04/04/2011  . Hypogonadism male 04/04/2011  . Myocardial infarction ([place])   . Stroke ([place])   . Substance abuse ([place])   . Type II or unspecified type diabetes  mellitus without mention of complication, uncontrolled 04/04/2011  . Unspecified essential hypertension 04/04/2011    Past Surgical History:  Procedure Laterality Date  . CARDIAC CATHETERIZATION      Family History  Problem Relation Age of Onset  . Hypertension Father   . Dementia Father   . Emphysema Mother   . Cancer Neg Hx   . Diabetes Neg Hx   . Heart disease Neg Hx   . Stroke Neg Hx     Social History   Socioeconomic History  . Marital status: Married    Spouse name: Not on file  . Number of children: Not on file  . Years of education: Not on file  . Highest education level: Not on file  Social Needs  . Financial resource strain: Not on file  . Food insecurity - worry: Not on file  . Food insecurity - inability: Not on file  . Transportation needs - medical: Not on file  . Transportation needs - non-medical: Not on file  Occupational History  . Occupation: OPERATIONS MANAGER of maintenance    Employer: [employer]  Tobacco Use  . Smoking status: Never Smoker  . Smokeless tobacco: Never Used  Substance and Sexual Activity  . Alcohol use: No  . Drug use: No  . Sexual activity: Yes  Other Topics Concern  . Not on file  Social History Narrative   Caffienated drinks-no   Seat belt use often-yes   Regular Exercise-no   Smoke alarm in the home-yes   Firearms/guns in the home-yes   History of physical abuse-no                 Current Outpatient  Medications:  .  aspirin EC 81 MG tablet, Take 81 mg by mouth daily., Disp: , Rfl:  .  atorvastatin (LIPITOR) 80 MG tablet, Take 1 tablet (80 mg total) by mouth daily., Disp: 90 tablet, Rfl: 1 .  clopidogrel (PLAVIX) 75 MG tablet, Take 75 mg by mouth daily., Disp: , Rfl:  .  glimepiride (AMARYL) 4 MG tablet, TAKE 1 TABLET (4 MG TOTAL) BY MOUTH DAILY., Disp: 90 tablet, Rfl: 0 .  JANUVIA 100 MG tablet, TAKE 1 TABLET (100 MG TOTAL) BY MOUTH DAILY., Disp: 90 tablet, Rfl: 0 .  lisinopril (PRINIVIL,ZESTRIL) 5 MG tablet,  TAKE 1 TABLET BY MOUTH ONCE DAILY, Disp: 90 tablet, Rfl: 0 .  metFORMIN (GLUCOPHAGE) 850 MG tablet, TAKE 1 TABLET BY MOUTH DAILY WITH BREAKFAST., Disp: 90 tablet, Rfl: 0 .  metoprolol succinate (TOPROL-XL) 25 MG 24 hr tablet, TAKE 1 TABLET (25 MG TOTAL) BY MOUTH DAILY., Disp: 90 tablet, Rfl: 0 .  nitroGLYCERIN (NITROSTAT) 0.4 MG SL tablet, Take 1 tablet under tongue as directed as needed [Take 1 tablet q5 minutes with max dose of 3 doses. If 3rd dose is needed, call 911], Disp: , Rfl:  .  testosterone cypionate (DEPOTESTOSTERONE CYPIONATE) 200 MG/ML injection, Inject 200 mg into the muscle every 7 (seven) days. Pt uses on Saturday., Disp: , Rfl:  .  valACYclovir (VALTREX) 1000 MG tablet, TAKE 1 TABLET BY MOUTH TWICE DAILY, Disp: 30 tablet, Rfl: 0 .  Alprostadil, Vasodilator, (EDEX IC), 1 Dose by Intracavernosal route as needed (for erectile dysfunstion). Pt is unsure of the dose., Disp: , Rfl:  .  sildenafil (VIAGRA) 100 MG tablet, Take 0.5-1 tablets (50-100 mg total) by mouth daily as needed for erectile dysfunction. (Patient not taking: Reported on 02/25/2017), Disp: 5 tablet, Rfl: 11  Current Facility-Administered Medications:  .  testosterone cypionate (DEPOTESTOTERONE CYPIONATE) injection 200 mg, 200 mg, Intramuscular, Q14 Days, Janith Lima, MD, 200 mg at 07/29/12 1635  Allergies  Allergen Reactions  . Pravachol [Pravastatin Sodium] Other (See Comments)    Reaction:  Muscle aches      Review of Systems  Constitutional: Negative for fatigue.  Eyes: Negative for blurred vision.  Respiratory: Negative for shortness of breath.   Cardiovascular: Negative for chest pain and palpitations.  Musculoskeletal: Negative for myalgias.  Neurological: Positive for dizziness. Negative for headaches.  Endo/Heme/Allergies: Negative for polydipsia.  Psychiatric/Behavioral: The patient is nervous/anxious.      Objective  Vitals:   02/25/17 1602  BP: 122/78  Pulse: 74  Resp: 14  Temp: 98.4  F (36.9 C)  TempSrc: Oral  SpO2: 95%  Weight: 238 lb 9.6 oz (108.2 kg)    Physical Exam  Constitutional: He is oriented to person, place, and time and well-developed, well-nourished, and in no distress.  HENT:  Head: Normocephalic and atraumatic.  Musculoskeletal: He exhibits no edema.  Neurological: He is alert and oriented to person, place, and time.  Psychiatric: Mood, memory, affect and judgment normal.  Nursing note and vitals reviewed.      Assessment & Plan  1. Controlled type 2 diabetes mellitus with complication, without long-term current use of insulin (HCC) A1c was 6.7%, well controlled diabetes, decrease Glimepiride to 2 mg daily to avoid hypoglycemic episodes at least 3 months - POCT HgB A1C - glimepiride (AMARYL) 2 MG tablet; Take 1 tablet (2 mg total) by mouth daily with breakfast.  Dispense: 90 tablet; Refill: 1  2. Pure hypercholesterolemia  - Lipid panel - COMPLETE METABOLIC PANEL WITH GFR  3. Essential hypertension BP stable on present anti-hypertensive treatment   Brae Gartman Asad A. [facility] 02/25/2017 4:21 PM

## 2017-03-08 ENCOUNTER — Other Ambulatory Visit: Payer: Self-pay | Admitting: Family Medicine

## 2017-03-20 ENCOUNTER — Other Ambulatory Visit: Payer: Self-pay | Admitting: Family Medicine

## 2017-03-21 ENCOUNTER — Other Ambulatory Visit
Admission: RE | Admit: 2017-03-21 | Discharge: 2017-03-21 | Disposition: A | Discharge status: Home / Self Care | Payer: 59 | Source: Ambulatory Visit | Attending: Family Medicine | Admitting: Family Medicine

## 2017-03-21 DIAGNOSIS — E7800 Pure hypercholesterolemia, unspecified: Secondary | ICD-10-CM | POA: Diagnosis not present

## 2017-03-21 LAB — COMPREHENSIVE METABOLIC PANEL
ALT: 29 U/L (ref 17–63)
AST: 25 U/L (ref 15–41)
Albumin: 4 g/dL (ref 3.5–5.0)
Alkaline Phosphatase: 80 U/L (ref 38–126)
Anion gap: 8 (ref 5–15)
BUN: 12 mg/dL (ref 6–20)
CO2: 25 mmol/L (ref 22–32)
Calcium: 9.3 mg/dL (ref 8.9–10.3)
Chloride: 103 mmol/L (ref 101–111)
Creatinine, Ser: 1.06 mg/dL (ref 0.61–1.24)
GFR calc Af Amer: >60 mL/min (ref >60)
GFR calc non Af Amer: >60 mL/min (ref >60)
Glucose, Bld: 200 mg/dL — ABNORMAL HIGH (ref 65–99)
Potassium: 4 mmol/L (ref 3.5–5.1)
Sodium: 136 mmol/L (ref 135–145)
Total Bilirubin: 1.1 mg/dL (ref 0.3–1.2)
Total Protein: 6.9 g/dL (ref 6.5–8.1)

## 2017-03-21 LAB — LIPID PANEL
Cholesterol: 122 mg/dL (ref 0–200)
HDL: 30 mg/dL — ABNORMAL LOW (ref >40)
LDL Cholesterol: 75 mg/dL (ref 0–99)
Total CHOL/HDL Ratio: 4.1 RATIO
Triglycerides: 87 mg/dL (ref <150)
VLDL: 17 mg/dL (ref 0–40)

## 2017-03-29 ENCOUNTER — Other Ambulatory Visit: Payer: Self-pay | Admitting: Family Medicine

## 2017-03-29 DIAGNOSIS — E118 Type 2 diabetes mellitus with unspecified complications: Secondary | ICD-10-CM

## 2017-04-15 ENCOUNTER — Other Ambulatory Visit: Payer: Self-pay | Admitting: Family Medicine

## 2017-04-15 DIAGNOSIS — E118 Type 2 diabetes mellitus with unspecified complications: Secondary | ICD-10-CM

## 2017-04-22 ENCOUNTER — Other Ambulatory Visit: Payer: Self-pay | Admitting: Family Medicine

## 2017-04-22 DIAGNOSIS — E7800 Pure hypercholesterolemia, unspecified: Secondary | ICD-10-CM

## 2017-04-22 DIAGNOSIS — I10 Essential (primary) hypertension: Secondary | ICD-10-CM

## 2017-04-22 DIAGNOSIS — E118 Type 2 diabetes mellitus with unspecified complications: Secondary | ICD-10-CM

## 2017-06-25 ENCOUNTER — Other Ambulatory Visit: Payer: Self-pay

## 2017-06-25 NOTE — Telephone Encounter (Signed)
Refill request for Hypertension medication: Lisinopril 5 mg   Last office visit pertaining to hypertension: 02/25/2017  BP Readings from Last 3 Encounters:  05/03/16 112/84  04/20/16 122/77  02/29/16 124/80     Lab Results  Component Value Date   CREATININE 1.06 03/21/2017   BUN 12 03/21/2017   NA 136 03/21/2017   K 4.0 03/21/2017   CL 103 03/21/2017   CO2 25 03/21/2017     Follow-ups on file. 08/22/2017

## 2017-06-27 MED ORDER — LISINOPRIL 5 MG PO TABS: 5.0000 mg | ORAL_TABLET | Freq: Every day | ORAL | 0 refills | Status: DC

## 2017-06-27 NOTE — Telephone Encounter (Signed)
Left detailed voicemail

## 2017-06-27 NOTE — Telephone Encounter (Signed)
This is actually Dr Delight Ovens pt and he has an appt scheduled for 08/22/17. Please advise

## 2017-06-27 NOTE — Telephone Encounter (Signed)
Please let patient know that I've sent in his refill and look forward to seeing him in May

## 2017-06-27 NOTE — Telephone Encounter (Signed)
Last BP 122/78 Last Cr and K+ normal Rx approved

## 2017-06-27 NOTE — Addendum Note (Signed)
Addended by: Nizar Cutler, Satira Anis on: 06/27/2017 12:30 PM   Modules accepted: Orders

## 2017-06-27 NOTE — Telephone Encounter (Signed)
Please call and schedule appointment for refills.

## 2017-07-19 ENCOUNTER — Other Ambulatory Visit: Payer: Self-pay

## 2017-07-19 DIAGNOSIS — E118 Type 2 diabetes mellitus with unspecified complications: Secondary | ICD-10-CM

## 2017-07-19 MED ORDER — SITAGLIPTIN PHOSPHATE 100 MG PO TABS: 100.0000 mg | ORAL_TABLET | Freq: Every day | ORAL | 1 refills | Status: DC

## 2017-07-19 NOTE — Telephone Encounter (Signed)
Patient wants to know if Dr Sanda Klein will be prompting this before the end of the day as he requested this at the first of the week with the pharmacy. Advised patient it can take anywhere from 24-72 business hours.

## 2017-07-19 NOTE — Telephone Encounter (Signed)
Cr reviewed; A1c reviewed Rx sent Patient notified by voicemail

## 2017-07-25 ENCOUNTER — Other Ambulatory Visit: Payer: Self-pay

## 2017-07-25 DIAGNOSIS — I10 Essential (primary) hypertension: Secondary | ICD-10-CM

## 2017-07-25 DIAGNOSIS — E118 Type 2 diabetes mellitus with unspecified complications: Secondary | ICD-10-CM

## 2017-07-25 MED ORDER — METOPROLOL SUCCINATE ER 25 MG PO TB24: 25.0000 mg | ORAL_TABLET | Freq: Every day | ORAL | 0 refills | Status: DC

## 2017-07-25 MED ORDER — METFORMIN HCL 850 MG PO TABS: 850.0000 mg | ORAL_TABLET | Freq: Every day | ORAL | 0 refills | Status: DC

## 2017-07-25 NOTE — Telephone Encounter (Signed)
Last Cr and A1c reviewed; Rxs approved

## 2017-08-16 DIAGNOSIS — Z125 Encounter for screening for malignant neoplasm of prostate: Secondary | ICD-10-CM | POA: Diagnosis not present

## 2017-08-16 DIAGNOSIS — E291 Testicular hypofunction: Secondary | ICD-10-CM | POA: Diagnosis not present

## 2017-08-16 DIAGNOSIS — N5201 Erectile dysfunction due to arterial insufficiency: Secondary | ICD-10-CM | POA: Diagnosis not present

## 2017-08-22 ENCOUNTER — Encounter: Payer: Self-pay | Admitting: Family Medicine

## 2017-08-22 ENCOUNTER — Ambulatory Visit: Payer: 59 | Admitting: Family Medicine

## 2017-08-22 VITALS — BP 144/92 | HR 83 | Temp 98.6°F | Resp 14 | Ht 72.0 in | Wt 236.9 lb

## 2017-08-22 DIAGNOSIS — Z5181 Encounter for therapeutic drug level monitoring: Secondary | ICD-10-CM | POA: Diagnosis not present

## 2017-08-22 DIAGNOSIS — L659 Nonscarring hair loss, unspecified: Secondary | ICD-10-CM | POA: Diagnosis not present

## 2017-08-22 DIAGNOSIS — I10 Essential (primary) hypertension: Secondary | ICD-10-CM

## 2017-08-22 DIAGNOSIS — E349 Endocrine disorder, unspecified: Secondary | ICD-10-CM

## 2017-08-22 DIAGNOSIS — M778 Other enthesopathies, not elsewhere classified: Secondary | ICD-10-CM

## 2017-08-22 DIAGNOSIS — E669 Obesity, unspecified: Secondary | ICD-10-CM | POA: Diagnosis not present

## 2017-08-22 DIAGNOSIS — N529 Male erectile dysfunction, unspecified: Secondary | ICD-10-CM | POA: Diagnosis not present

## 2017-08-22 DIAGNOSIS — E039 Hypothyroidism, unspecified: Secondary | ICD-10-CM

## 2017-08-22 DIAGNOSIS — I2510 Atherosclerotic heart disease of native coronary artery without angina pectoris: Secondary | ICD-10-CM | POA: Diagnosis not present

## 2017-08-22 DIAGNOSIS — E118 Type 2 diabetes mellitus with unspecified complications: Secondary | ICD-10-CM

## 2017-08-22 DIAGNOSIS — Z6832 Body mass index (BMI) 32.0-32.9, adult: Secondary | ICD-10-CM

## 2017-08-22 DIAGNOSIS — E7800 Pure hypercholesterolemia, unspecified: Secondary | ICD-10-CM

## 2017-08-22 DIAGNOSIS — Z8673 Personal history of transient ischemic attack (TIA), and cerebral infarction without residual deficits: Secondary | ICD-10-CM | POA: Diagnosis not present

## 2017-08-22 DIAGNOSIS — E291 Testicular hypofunction: Secondary | ICD-10-CM

## 2017-08-22 DIAGNOSIS — E1169 Type 2 diabetes mellitus with other specified complication: Secondary | ICD-10-CM | POA: Diagnosis not present

## 2017-08-22 MED ORDER — METOPROLOL SUCCINATE ER 50 MG PO TB24: 50.0000 mg | ORAL_TABLET | Freq: Every day | ORAL | 3 refills | Status: DC

## 2017-08-22 MED ORDER — LISINOPRIL 10 MG PO TABS: 10.0000 mg | ORAL_TABLET | Freq: Every day | ORAL | 3 refills | Status: DC

## 2017-08-22 NOTE — Assessment & Plan Note (Addendum)
Explained SGLT-2 and GLP-1 options for secondary prevention of coronary artery disease; explained that the DPP-4 does not give [place] those benefits; he opted to NOT change today, leave meds as they are; see what the labs show; foot exam by MD; encouraged weight loss; patient will call for eye appt (due)

## 2017-08-22 NOTE — Assessment & Plan Note (Signed)
Diabetes complicated by his obesity

## 2017-08-22 NOTE — Assessment & Plan Note (Signed)
Encouraged weight loss; he declined referral to nutritionist

## 2017-08-22 NOTE — Assessment & Plan Note (Signed)
Managed by urologist 

## 2017-08-22 NOTE — Assessment & Plan Note (Signed)
Managed by urolologist; aware of cardiac risk

## 2017-08-22 NOTE — Assessment & Plan Note (Signed)
Alopecia totalis; chronic, stable

## 2017-08-22 NOTE — Assessment & Plan Note (Signed)
Encouraged diet low in saturated fats; check lipids; goal LDL is less than 70 right now

## 2017-08-22 NOTE — Assessment & Plan Note (Signed)
Managed by urologist; patient is aware of cardiac risk with testosterone supplementation

## 2017-08-22 NOTE — Assessment & Plan Note (Signed)
Managed by cardiologist; will increase the beta-blocker given his hypertension today and heart rate above 80; continue anti-platelet agents per cardiologist; goal LDL less than 70

## 2017-08-22 NOTE — Assessment & Plan Note (Signed)
Not to goal; heart rate above 80, so will increase metoprolol from 25 mg to 50 mg daily, and also increase ACE-I from 5 to 10 mg daily; return in 1 week for recheck with CMA and BMP (ordered); stress reduction, weight loss will also help

## 2017-08-22 NOTE — Progress Notes (Signed)
BP (!) 144/92   Pulse 83   Temp 98.6 F (37 C) (Oral)   Resp 14   Ht 6' (1.829 m)   Wt 236 lb 14.4 oz (107.5 kg)   SpO2 97%   BMI 32.13 kg/m    Subjective:    Patient ID: Karl Morgan, male    DOB: 01/21/1961, 57 y.o.   MRN: [id number]  HPI: Karl Morgan is a 57 y.o. male  Chief Complaint  Patient presents with  . Follow-up    former [facility] pt  . Hypertension    pt states has been increasing  . Cough    with congestion  . Elbow Pain    onset 6-8 months in right    HPI Patient is new to me; previous provider left our practice  Type 2 diabetes; diagnosed with this about 15 years ago, but really got good treatment about 10 years ago; eye exam was March 2018; Karl Morgan'll schedule an appointment soon; no problems with medicines in terms of cost or compliance  Hypertension; BP has been going up; loves Karl Morgan job but more stress; if Karl Morgan can get by himself, that helps; 5:30 to 7:00 pm quiet time to catch so not behind the next day   Obesity; Karl Morgan has lost 25 pounds on purpose; Karl Morgan has a bowflex and works out with Karl Morgan wife; she is disciplined to work out more than Karl Morgan is; watches TV and will try to go to bed by 11 pm and get up at 7 am; was up to 260 pounds at one point  Urologist, Dr. Eulogio Ditch; advised him to get a cuff; testosterone supplementation and ED, patient says Karl Morgan is aware of cardiac risk with testosterone; number was over 1100 and urologist is having him get retested soon; Karl Morgan was extremely lethargic before starting the testosterone  Hx of MI and stroke; no symptoms; seeing Dr. Clayborn Bigness in July (cardiologist)  High cholesterol; on statin; no muscle aches; low HDL noted on last 4 draws  Cough; Karl Morgan never gets sick and then woke up Monday, cold chills, teeth chattering; tylenol and woke up in pouring sweat, then a little congestion in the throat; odd just mentioned, fine since then  RIGHT elbow pain; hx of bursitiis; fam hx of arthritis (GF); in the joint and between the two bones  and radiates through the elbow medially; really bothers him when Karl Morgan uses; resting helps; LEFT-handed; taking tylenol now and then; 3.5 to 4 in pain; lifting something heavy hurts  Karl Morgan mentions that Karl Morgan has chronic alopecia (totalis)  Karl Morgan is in recovery from alcoholism, 20 years  Lab Results  Component Value Date   TSH 3.42 02/29/2016    Depression screen [facility] 2/9 08/22/2017 02/25/2017 10/16/2016 07/17/2016 04/20/2016  Decreased Interest 0 0 0 0 0  Down, Depressed, Hopeless 0 0 0 0 0  PHQ - 2 Score 0 0 0 0 0    Relevant past medical, surgical, family and social history reviewed Past Medical History:  Diagnosis Date  . Alopecia 04/04/2011  . Diabetes mellitus   . Erectile dysfunction 04/04/2011  . Heart murmur   . Hyperlipidemia 04/04/2011  . Hypogonadism male 04/04/2011  . Myocardial infarction ([place])   . Stroke ([place])   . Substance abuse ([place])   . Type II or unspecified type diabetes mellitus without mention of complication, uncontrolled 04/04/2011  . Unspecified essential hypertension 04/04/2011   Past Surgical History:  Procedure Laterality Date  . CARDIAC CATHETERIZATION     Family History  Problem Relation Age of Onset  . Hypertension Father   . Dementia Father   . Emphysema Mother   . Cancer Neg Hx   . Diabetes Neg Hx   . Heart disease Neg Hx   . Stroke Neg Hx    Social History   Tobacco Use  . Smoking status: Never Smoker  . Smokeless tobacco: Never Used  Substance Use Topics  . Alcohol use: No  . Drug use: No    Interim medical history since last visit reviewed. Allergies and medications reviewed  Review of Systems Per HPI unless specifically indicated above     Objective:    BP (!) 144/92   Pulse 83   Temp 98.6 F (37 C) (Oral)   Resp 14   Ht 6' (1.829 m)   Wt 236 lb 14.4 oz (107.5 kg)   SpO2 97%   BMI 32.13 kg/m   Wt Readings from Last 3 Encounters:  08/22/17 236 lb 14.4 oz (107.5 kg)  02/25/17 238 lb 9.6 oz (108.2 kg)  10/16/16 240 lb 3.2 oz (109 kg)      Physical Exam  Constitutional: Karl Morgan appears well-developed and well-nourished. No distress.  obese  HENT:  Head: Normocephalic and atraumatic.  Eyes: EOM are normal. No scleral icterus.  Neck: No thyromegaly present.  Cardiovascular: Normal rate and regular rhythm.  Pulmonary/Chest: Effort normal and breath sounds normal.  Abdominal: Soft. Bowel sounds are normal. Karl Morgan exhibits no distension.  Musculoskeletal: Karl Morgan exhibits no edema.       Right elbow: Karl Morgan exhibits normal range of motion, no swelling and no deformity.  Area of usual tenderness in between the lateral epicondyle and the olecranon, NOT between olecranon and medial epicondyle (RIGHT elbow)  Neurological: Karl Morgan is alert.  UE strength 5/5; no muscle atrophy of the RUE  Skin: Skin is warm and dry. No pallor.  Alopecia totalis  Psychiatric: Karl Morgan has a normal mood and affect. Karl Morgan behavior is normal. Judgment and thought content normal.   Diabetic Foot Form - Detailed   Diabetic Foot Exam - detailed Diabetic Foot exam was performed with the following findings:  Yes 08/22/2017  9:35 AM  Visual Foot Exam completed.:  Yes  Pulse Foot Exam completed.:  Yes  Right Dorsalis Pedis:  Present Left Dorsalis Pedis:  Present  Sensory Foot Exam Completed.:  Yes Semmes-Weinstein Monofilament Test R Site 1-Great Toe:  Pos L Site 1-Great Toe:  Pos         Results for orders placed or performed during the hospital encounter of 03/21/17  Lipid panel  Result Value Ref Range   Cholesterol 122 0 - 200 mg/dL   Triglycerides 87 <150 mg/dL   HDL 30 (L) >40 mg/dL   Total CHOL/HDL Ratio 4.1 RATIO   VLDL 17 0 - 40 mg/dL   LDL Cholesterol 75 0 - 99 mg/dL  Comprehensive metabolic panel  Result Value Ref Range   Sodium 136 135 - 145 mmol/L   Potassium 4.0 3.5 - 5.1 mmol/L   Chloride 103 101 - 111 mmol/L   CO2 25 22 - 32 mmol/L   Glucose, Bld 200 (H) 65 - 99 mg/dL   BUN 12 6 - 20 mg/dL   Creatinine, Ser 1.06 0.61 - 1.24 mg/dL   Calcium 9.3 8.9 -  10.3 mg/dL   Total Protein 6.9 6.5 - 8.1 g/dL   Albumin 4.0 3.5 - 5.0 g/dL   AST 25 15 - 41 U/L   ALT 29 17 - 63 U/L  Alkaline Phosphatase 80 38 - 126 U/L   Total Bilirubin 1.1 0.3 - 1.2 mg/dL   GFR calc non Af Amer >60 >60 mL/min   GFR calc Af Amer >60 >60 mL/min   Anion gap 8 5 - 15      Assessment & Plan:   Problem List Items Addressed This Visit      Cardiovascular and Mediastinum   Essential hypertension (Chronic)    Not to goal; heart rate above 80, so will increase metoprolol from 25 mg to 50 mg daily, and also increase ACE-I from 5 to 10 mg daily; return in 1 week for recheck with CMA and BMP (ordered); stress reduction, weight loss will also help      Relevant Medications   metoprolol succinate (TOPROL-XL) 50 MG 24 hr tablet   lisinopril (PRINIVIL,ZESTRIL) 10 MG tablet   Arteriosclerosis of coronary artery (Chronic)    Managed by cardiologist; will increase the beta-blocker given Karl Morgan hypertension today and heart rate above 80; continue anti-platelet agents per cardiologist; goal LDL less than 70      Relevant Medications   metoprolol succinate (TOPROL-XL) 50 MG 24 hr tablet   lisinopril (PRINIVIL,ZESTRIL) 10 MG tablet     Endocrine   Hypothyroidism    Check TSH today      Relevant Medications   metoprolol succinate (TOPROL-XL) 50 MG 24 hr tablet   Other Relevant Orders   TSH   Hypogonadism male (Chronic)    Managed by urologist; patient is aware of cardiac risk with testosterone supplementation      Diabetes mellitus type 2, controlled, with complications ([place]) - Primary (Chronic)    Explained SGLT-2 and GLP-1 options for secondary prevention of coronary artery disease; explained that the DPP-4 does not give [place] those benefits; Karl Morgan opted to NOT change today, leave meds as they are; see what the labs show; foot exam by MD; encouraged weight loss; patient will call for eye appt (due)      Relevant Medications   lisinopril (PRINIVIL,ZESTRIL) 10 MG tablet    Other Relevant Orders   Microalbumin / creatinine urine ratio   Hemoglobin A1c   Diabetes mellitus type 2 in obese (HCC)    Diabetes complicated by Karl Morgan obesity      Relevant Medications   lisinopril (PRINIVIL,ZESTRIL) 10 MG tablet     Musculoskeletal and Integument   Alopecia (Chronic)    Alopecia totalis; chronic, stable        Genitourinary   Erectile dysfunction (Chronic)    Managed by urologist        Other   Obesity (BMI 30.0-34.9)    Encouraged weight loss; Karl Morgan declined referral to nutritionist      Hypotestosteronism    Managed by urolologist; aware of cardiac risk      Hyperlipidemia (Chronic)    Encouraged diet low in saturated fats; check lipids; goal LDL is less than 70 right now      Relevant Medications   metoprolol succinate (TOPROL-XL) 50 MG 24 hr tablet   lisinopril (PRINIVIL,ZESTRIL) 10 MG tablet   Other Relevant Orders   Lipid panel   History of stroke    Goal LDL less than 70; increase antihypertensives; stress reduction encouraged, mindfulness, relaxed breathing, etc. ; Karl Morgan is not interested in classes through the hospital       Other Visit Diagnoses    Medication monitoring encounter       Relevant Orders   COMPLETE METABOLIC PANEL WITH GFR   Basic metabolic panel  BMI 32.0-32.9,adult       see AVS   Right elbow tendonitis       try band, limit activity; topical ice with protective cloth layer; tylenol; advised AGAINST NSAIDs; we'll treat conservatively now, to ortho if not improving       Follow up plan: Return in about 1 week (around 08/29/2017) for blood pressure recheck with CMA; 3.5 months with Dr. Sanda Klein, labs 1 week prior.  An after-visit summary was printed and given to the patient at [place].  Please see the patient instructions which may contain other information and recommendations beyond what is mentioned above in the assessment and plan.  Meds ordered this encounter  Medications  . metoprolol succinate (TOPROL-XL) 50 MG  24 hr tablet    Sig: Take 1 tablet (50 mg total) by mouth daily. Take with or immediately following a meal.    Dispense:  90 tablet    Refill:  3  . lisinopril (PRINIVIL,ZESTRIL) 10 MG tablet    Sig: Take 1 tablet (10 mg total) by mouth daily.    Dispense:  90 tablet    Refill:  3    Orders Placed This Encounter  Procedures  . Microalbumin / creatinine urine ratio  . Lipid panel  . Hemoglobin A1c  . COMPLETE METABOLIC PANEL WITH GFR  . TSH  . Basic metabolic panel

## 2017-08-22 NOTE — Assessment & Plan Note (Signed)
Check TSH today

## 2017-08-22 NOTE — Assessment & Plan Note (Signed)
Goal LDL less than 70; increase antihypertensives; stress reduction encouraged, mindfulness, relaxed breathing, etc. ; he is not interested in classes through the hospital

## 2017-08-22 NOTE — Patient Instructions (Addendum)
Please do increase your metoprolol to 50 mg daily and your lisinopril to 10 mg daily Try to follow the DASH guidelines (DASH stands for Dietary Approaches to Stop Hypertension). Try to limit the sodium in your diet to no more than 1,500mg  of sodium per day. Certainly try to not exceed 2,000 mg per day at the very most. Do not add salt when cooking or at the table.  Check the sodium amount on labels when shopping, and choose items lower in sodium when given a choice. Avoid or limit foods that already contain a lot of sodium. Eat a diet rich in fruits and vegetables and whole grains, and try to lose weight if overweight or obese  Check out the information at familydoctor.org entitled "Nutrition for Weight Loss: What You Need to Know about Fad Diets" Try to lose between 1-2 pounds per week by taking in fewer calories and burning off more calories You can succeed by limiting portions, limiting foods dense in calories and fat, becoming more active, and drinking 8 glasses of water a day (64 ounces) Don't skip meals, especially breakfast, as skipping meals may alter your metabolism Do not use over-the-counter weight loss pills or gimmicks that claim rapid weight loss A healthy BMI (or body mass index) is between 18.5 and 24.9 You can calculate your ideal BMI at the [place] website [employer]fr  Please do see your eye doctor regularly, and have your eyes examined every year (or more often per his or her recommendation) Check your feet every night and let me know right away of any sores, infections, numbness, etc. Try to limit sweets, white bread, white rice, white potatoes It is okay with me for you to not check your fingerstick blood sugars (per [employer] of Endocrinology Best Practices), unless you are interested and feel it would be helpful for you  You can try temperature (ice after using the arm), tylenol, band; let me know if not better

## 2017-08-23 LAB — COMPLETE METABOLIC PANEL WITH GFR
AG Ratio: 1.8 (calc) (ref 1.0–2.5)
ALT: 24 U/L (ref 9–46)
AST: 18 U/L (ref 10–35)
Albumin: 4.4 g/dL (ref 3.6–5.1)
Alkaline phosphatase (APISO): 79 U/L (ref 40–115)
BUN: 14 mg/dL (ref 7–25)
CO2: 25 mmol/L (ref 20–32)
Calcium: 9.6 mg/dL (ref 8.6–10.3)
Chloride: 103 mmol/L (ref 98–110)
Creat: 1.23 mg/dL (ref 0.70–1.33)
GFR, Est African American: 75 mL/min/{1.73_m2} (ref >=60)
GFR, Est Non African American: 65 mL/min/{1.73_m2} (ref >=60)
Globulin: 2.4 g/dL (calc) (ref 1.9–3.7)
Glucose, Bld: 136 mg/dL (ref 65–139)
Potassium: 4.6 mmol/L (ref 3.5–5.3)
Sodium: 138 mmol/L (ref 135–146)
Total Bilirubin: 0.9 mg/dL (ref 0.2–1.2)
Total Protein: 6.8 g/dL (ref 6.1–8.1)

## 2017-08-23 LAB — HEMOGLOBIN A1C
Hgb A1c MFr Bld: 6.6 % of total Hgb — ABNORMAL HIGH (ref <5.7)
Mean Plasma Glucose: 143 (calc)
eAG (mmol/L): 7.9 (calc)

## 2017-08-23 LAB — TSH: TSH: 3.78 mIU/L (ref 0.40–4.50)

## 2017-08-23 LAB — LIPID PANEL
Cholesterol: 122 mg/dL (ref <200)
HDL: 34 mg/dL — ABNORMAL LOW (ref >40)
LDL Cholesterol (Calc): 71 mg/dL (calc)
Non-HDL Cholesterol (Calc): 88 mg/dL (calc) (ref <130)
Total CHOL/HDL Ratio: 3.6 (calc) (ref <5.0)
Triglycerides: 85 mg/dL (ref <150)

## 2017-08-23 LAB — MICROALBUMIN / CREATININE URINE RATIO
Creatinine, Urine: 128 mg/dL (ref 20–320)
Microalb Creat Ratio: 2 mcg/mg creat (ref <30)
Microalb, Ur: 0.3 mg/dL

## 2017-08-28 ENCOUNTER — Telehealth: Payer: Self-pay | Admitting: Family Medicine

## 2017-08-28 ENCOUNTER — Telehealth: Payer: Self-pay

## 2017-08-28 NOTE — Telephone Encounter (Signed)
Pt notified about labs, but does not want them sent through my chart.  He states does not have/check and would always like a call about them.

## 2017-08-28 NOTE — Telephone Encounter (Signed)
Left detailed voicemail

## 2017-08-28 NOTE — Telephone Encounter (Signed)
Copied from [place] [id number]. Topic: Quick Communication - See Telephone Encounter >> Aug 28, 2017 12:54 PM Bea Graff, NT wrote: CRM for notification. See Telephone encounter for: 08/28/17. Pt would like a call with his lab results

## 2017-08-28 NOTE — Telephone Encounter (Signed)
Then please either offer to mail the information or read him the information, as I went into great detail Thank you

## 2017-08-28 NOTE — Telephone Encounter (Signed)
Please ask him to read the detailed MyChart notes that I sent him about his lab results please Thank you Also, we received a note from the insurance company reminding him about the need for an eye exam; offer assistance with referral if desired; ask him to have eye doctor send [place] a copy of the note

## 2017-08-29 ENCOUNTER — Ambulatory Visit: Payer: 59

## 2017-08-29 NOTE — Telephone Encounter (Signed)
Already did, but for future reference

## 2017-09-02 DIAGNOSIS — E291 Testicular hypofunction: Secondary | ICD-10-CM | POA: Diagnosis not present

## 2017-09-05 ENCOUNTER — Ambulatory Visit: Payer: 59

## 2017-09-05 VITALS — BP 126/80 | HR 77

## 2017-09-05 DIAGNOSIS — I10 Essential (primary) hypertension: Secondary | ICD-10-CM

## 2017-09-17 DIAGNOSIS — I10 Essential (primary) hypertension: Secondary | ICD-10-CM | POA: Diagnosis not present

## 2017-09-17 DIAGNOSIS — I2510 Atherosclerotic heart disease of native coronary artery without angina pectoris: Secondary | ICD-10-CM | POA: Diagnosis not present

## 2017-09-17 DIAGNOSIS — I209 Angina pectoris, unspecified: Secondary | ICD-10-CM | POA: Diagnosis not present

## 2017-09-17 DIAGNOSIS — E669 Obesity, unspecified: Secondary | ICD-10-CM | POA: Diagnosis not present

## 2017-09-23 ENCOUNTER — Other Ambulatory Visit: Payer: Self-pay

## 2017-09-23 DIAGNOSIS — E118 Type 2 diabetes mellitus with unspecified complications: Secondary | ICD-10-CM

## 2017-09-23 MED ORDER — GLIMEPIRIDE 2 MG PO TABS: 2.0000 mg | ORAL_TABLET | Freq: Every day | ORAL | 1 refills | Status: DC

## 2017-10-14 ENCOUNTER — Other Ambulatory Visit: Payer: Self-pay

## 2017-10-14 MED ORDER — VALACYCLOVIR HCL 1 G PO TABS
ORAL_TABLET | ORAL | 5 refills | Status: DC
Start: 2017-10-14 — End: 2018-10-21

## 2017-10-14 NOTE — Telephone Encounter (Signed)
New instructions written

## 2017-10-29 ENCOUNTER — Other Ambulatory Visit: Payer: Self-pay | Admitting: Family Medicine

## 2017-10-29 ENCOUNTER — Telehealth: Payer: Self-pay | Admitting: Family Medicine

## 2017-10-29 DIAGNOSIS — E118 Type 2 diabetes mellitus with unspecified complications: Secondary | ICD-10-CM

## 2017-10-29 DIAGNOSIS — E7800 Pure hypercholesterolemia, unspecified: Secondary | ICD-10-CM

## 2017-10-29 MED ORDER — ATORVASTATIN CALCIUM 80 MG PO TABS: 80.0000 mg | ORAL_TABLET | Freq: Every day | ORAL | 1 refills | Status: DC

## 2017-10-29 NOTE — Telephone Encounter (Signed)
Left voicemail with pharmacy that only 7 day supply given due to needing blood work

## 2017-10-29 NOTE — Telephone Encounter (Signed)
Pt notified states he will get blood work done next week.  He is currently at a conference out state this week.  Please provide 1 weeks supply

## 2017-10-29 NOTE — Telephone Encounter (Signed)
Copied from [place] [id number]. Topic: Quick Communication - Rx Refill/Question >> Oct 29, 2017 11:21 AM Oliver Pila B wrote: Medication: metFORMIN (GLUCOPHAGE) 850 MG tablet [[id number]]   Pharmacy called to clarify the quantity; contact to advise

## 2017-10-29 NOTE — Telephone Encounter (Signed)
Patient was supposed to have a BMP done in June I understand he refused at that time Please ask him to have this done at the hospital today or tomorrow (please ORDER) I'll need this for the metformin We need to make sure his kidneys are okay

## 2017-10-29 NOTE — Telephone Encounter (Signed)
Limited Rx approved of the metformin He refused BMP in June; CMA stressed the importance of getting this done

## 2017-11-05 ENCOUNTER — Telehealth: Payer: Self-pay | Admitting: Family Medicine

## 2017-11-05 NOTE — Telephone Encounter (Signed)
Copied from [place]. Topic: General - Other >> Nov 05, 2017  5:01 PM Cecelia Byars, NT wrote: Reason for CRM: Patient called and needs to have have the order for labs please call once done  [phone]

## 2017-11-06 ENCOUNTER — Other Ambulatory Visit: Payer: Self-pay

## 2017-11-06 DIAGNOSIS — Z5181 Encounter for therapeutic drug level monitoring: Secondary | ICD-10-CM

## 2017-11-06 NOTE — Telephone Encounter (Signed)
BMP ordered and released to hospital

## 2017-11-11 ENCOUNTER — Other Ambulatory Visit: Payer: Self-pay

## 2017-11-11 ENCOUNTER — Other Ambulatory Visit
Admission: RE | Admit: 2017-11-11 | Discharge: 2017-11-11 | Disposition: A | Discharge status: Home / Self Care | Payer: 59 | Source: Ambulatory Visit | Attending: Family Medicine | Admitting: Family Medicine

## 2017-11-11 ENCOUNTER — Telehealth: Payer: Self-pay | Admitting: Family Medicine

## 2017-11-11 DIAGNOSIS — Z5181 Encounter for therapeutic drug level monitoring: Secondary | ICD-10-CM | POA: Diagnosis not present

## 2017-11-11 DIAGNOSIS — K635 Polyp of colon: Secondary | ICD-10-CM

## 2017-11-11 DIAGNOSIS — E782 Mixed hyperlipidemia: Secondary | ICD-10-CM

## 2017-11-11 DIAGNOSIS — E118 Type 2 diabetes mellitus with unspecified complications: Secondary | ICD-10-CM

## 2017-11-11 DIAGNOSIS — K625 Hemorrhage of anus and rectum: Secondary | ICD-10-CM

## 2017-11-11 DIAGNOSIS — E778 Other disorders of glycoprotein metabolism: Secondary | ICD-10-CM

## 2017-11-11 LAB — COMPREHENSIVE METABOLIC PANEL
ALT: 26 U/L (ref 0–44)
AST: 21 U/L (ref 15–41)
Albumin: 3.9 g/dL (ref 3.5–5.0)
Alkaline Phosphatase: 68 U/L (ref 38–126)
Anion gap: 4 — ABNORMAL LOW (ref 5–15)
BUN: 13 mg/dL (ref 6–20)
CO2: 26 mmol/L (ref 22–32)
Calcium: 8.9 mg/dL (ref 8.9–10.3)
Chloride: 108 mmol/L (ref 98–111)
Creatinine, Ser: 1.22 mg/dL (ref 0.61–1.24)
GFR calc Af Amer: >60 mL/min (ref >60)
GFR calc non Af Amer: >60 mL/min (ref >60)
Glucose, Bld: 148 mg/dL — ABNORMAL HIGH (ref 70–99)
Potassium: 4.2 mmol/L (ref 3.5–5.1)
Sodium: 138 mmol/L (ref 135–145)
Total Bilirubin: 0.8 mg/dL (ref 0.3–1.2)
Total Protein: 6.1 g/dL — ABNORMAL LOW (ref 6.5–8.1)

## 2017-11-11 MED ORDER — METFORMIN HCL 850 MG PO TABS: 850.0000 mg | ORAL_TABLET | Freq: Every day | ORAL | 5 refills | Status: DC

## 2017-11-11 NOTE — Telephone Encounter (Signed)
For some reason, lab did not come to me Says provider not in system Please resolve with lab or Karl Morgan so his results come to my box officially ---------------------------------- Let patient know that his kidney function is stable and he can continue the metformin His protein level is low; that can come from liver problems, loss of protein through the kidneys, through the gut, etc. I'd like to ask him to have a urine dip for protein, which can certainly be done at the hospital if that's more convenient for him, or he may come by here Once I see that, I'll decide on the next step Thank you

## 2017-11-11 NOTE — Telephone Encounter (Signed)
CMA did talk to him earlier

## 2017-11-11 NOTE — Telephone Encounter (Signed)
I spoke with patient; I'll be glad to get labs prior to his visits going forward; I explained why I needed the labs after adjusting his BP medicine prior to approving metformin (discussed kidney issues, lactic acidosis, etc.); he understood  We discussed his low protein; protein loss through kidneys or GI tract discussed; note to CMA also talked about liver issues; he has hx of liver enzyme abnormality but last transaminases were normal  He'll get urine dip POCT here next week to look for protein  His bowel movements have changed; seeing some blood in the stool; touch of blood when wiping; some stomach issues he explains as we talked about protein and enteropathy; he did some research on-line He'll see Dr. Melina Copa, he'll make his own appointment; referral entered I entered his labs to be done a few days prior to his September 10th appointment so we can discuss at his visit

## 2017-11-11 NOTE — Telephone Encounter (Signed)
Please call patient to discuss

## 2017-11-12 NOTE — Telephone Encounter (Signed)
Called they canceled and changed lab due to it the order not being through [place].

## 2017-11-18 DIAGNOSIS — L570 Actinic keratosis: Secondary | ICD-10-CM | POA: Diagnosis not present

## 2017-11-18 DIAGNOSIS — L821 Other seborrheic keratosis: Secondary | ICD-10-CM | POA: Diagnosis not present

## 2017-11-18 DIAGNOSIS — L918 Other hypertrophic disorders of the skin: Secondary | ICD-10-CM | POA: Diagnosis not present

## 2017-11-18 DIAGNOSIS — D2372 Other benign neoplasm of skin of left lower limb, including hip: Secondary | ICD-10-CM | POA: Diagnosis not present

## 2017-11-18 DIAGNOSIS — L820 Inflamed seborrheic keratosis: Secondary | ICD-10-CM | POA: Diagnosis not present

## 2017-11-18 DIAGNOSIS — L812 Freckles: Secondary | ICD-10-CM | POA: Diagnosis not present

## 2017-11-18 DIAGNOSIS — L578 Other skin changes due to chronic exposure to nonionizing radiation: Secondary | ICD-10-CM | POA: Diagnosis not present

## 2017-11-18 DIAGNOSIS — Z1283 Encounter for screening for malignant neoplasm of skin: Secondary | ICD-10-CM | POA: Diagnosis not present

## 2017-11-28 ENCOUNTER — Ambulatory Visit (INDEPENDENT_AMBULATORY_CARE_PROVIDER_SITE_OTHER): Payer: 59

## 2017-11-28 ENCOUNTER — Other Ambulatory Visit: Payer: Self-pay

## 2017-11-28 DIAGNOSIS — R809 Proteinuria, unspecified: Secondary | ICD-10-CM

## 2017-11-28 DIAGNOSIS — E778 Other disorders of glycoprotein metabolism: Secondary | ICD-10-CM

## 2017-11-28 DIAGNOSIS — E782 Mixed hyperlipidemia: Secondary | ICD-10-CM

## 2017-11-28 DIAGNOSIS — Z5181 Encounter for therapeutic drug level monitoring: Secondary | ICD-10-CM

## 2017-11-28 DIAGNOSIS — E118 Type 2 diabetes mellitus with unspecified complications: Secondary | ICD-10-CM

## 2017-11-28 LAB — POCT URINALYSIS DIPSTICK
Bilirubin, UA: NEGATIVE
Blood, UA: NEGATIVE
Glucose, UA: NEGATIVE
Ketones, UA: NEGATIVE
Leukocytes, UA: NEGATIVE
Nitrite, UA: NEGATIVE
Odor: NORMAL
Protein, UA: NEGATIVE
Spec Grav, UA: 1.02 (ref 1.010–1.025)
Urobilinogen, UA: 0.2 E.U./dL
pH, UA: 6 (ref 5.0–8.0)

## 2017-11-28 NOTE — Addendum Note (Signed)
Addended by: Docia Furl on: 11/28/2017 11:42 AM   Modules accepted: Orders

## 2017-12-05 ENCOUNTER — Other Ambulatory Visit
Admission: RE | Admit: 2017-12-05 | Discharge: 2017-12-05 | Disposition: A | Discharge status: Home / Self Care | Payer: 59 | Source: Ambulatory Visit | Attending: Family Medicine | Admitting: Family Medicine

## 2017-12-05 DIAGNOSIS — E778 Other disorders of glycoprotein metabolism: Secondary | ICD-10-CM | POA: Diagnosis present

## 2017-12-05 DIAGNOSIS — E118 Type 2 diabetes mellitus with unspecified complications: Secondary | ICD-10-CM | POA: Insufficient documentation

## 2017-12-05 DIAGNOSIS — Z5181 Encounter for therapeutic drug level monitoring: Secondary | ICD-10-CM | POA: Diagnosis present

## 2017-12-05 DIAGNOSIS — E782 Mixed hyperlipidemia: Secondary | ICD-10-CM | POA: Insufficient documentation

## 2017-12-05 DIAGNOSIS — K625 Hemorrhage of anus and rectum: Secondary | ICD-10-CM | POA: Diagnosis not present

## 2017-12-05 DIAGNOSIS — K5900 Constipation, unspecified: Secondary | ICD-10-CM | POA: Diagnosis not present

## 2017-12-05 LAB — LIPID PANEL
Cholesterol: 113 mg/dL (ref 0–200)
HDL: 26 mg/dL — ABNORMAL LOW (ref >40)
LDL Cholesterol: 59 mg/dL (ref 0–99)
Total CHOL/HDL Ratio: 4.3 RATIO
Triglycerides: 138 mg/dL (ref <150)
VLDL: 28 mg/dL (ref 0–40)

## 2017-12-05 LAB — COMPREHENSIVE METABOLIC PANEL
ALT: 25 U/L (ref 0–44)
AST: 22 U/L (ref 15–41)
Albumin: 4 g/dL (ref 3.5–5.0)
Alkaline Phosphatase: 62 U/L (ref 38–126)
Anion gap: 6 (ref 5–15)
BUN: 18 mg/dL (ref 6–20)
CO2: 24 mmol/L (ref 22–32)
Calcium: 8.8 mg/dL — ABNORMAL LOW (ref 8.9–10.3)
Chloride: 110 mmol/L (ref 98–111)
Creatinine, Ser: 1.25 mg/dL — ABNORMAL HIGH (ref 0.61–1.24)
GFR calc Af Amer: >60 mL/min (ref >60)
GFR calc non Af Amer: >60 mL/min (ref >60)
Glucose, Bld: 172 mg/dL — ABNORMAL HIGH (ref 70–99)
Potassium: 4.2 mmol/L (ref 3.5–5.1)
Sodium: 140 mmol/L (ref 135–145)
Total Bilirubin: 0.9 mg/dL (ref 0.3–1.2)
Total Protein: 6.5 g/dL (ref 6.5–8.1)

## 2017-12-05 LAB — HEMOGLOBIN A1C
Hgb A1c MFr Bld: 6.6 % — ABNORMAL HIGH (ref 4.8–5.6)
Mean Plasma Glucose: 142.72 mg/dL

## 2017-12-05 LAB — PROTIME-INR
INR: 1
Prothrombin Time: 13.1 seconds (ref 11.4–15.2)

## 2017-12-06 LAB — MICROALBUMIN / CREATININE URINE RATIO
Creatinine, Urine: 121.1 mg/dL
Microalb Creat Ratio: 2.6 mg/g creat (ref 0.0–30.0)
Microalb, Ur: 3.1 ug/mL — ABNORMAL HIGH

## 2017-12-10 ENCOUNTER — Encounter: Payer: Self-pay | Admitting: Family Medicine

## 2017-12-10 ENCOUNTER — Ambulatory Visit: Payer: 59 | Admitting: Family Medicine

## 2017-12-10 DIAGNOSIS — Z8673 Personal history of transient ischemic attack (TIA), and cerebral infarction without residual deficits: Secondary | ICD-10-CM

## 2017-12-10 DIAGNOSIS — I2510 Atherosclerotic heart disease of native coronary artery without angina pectoris: Secondary | ICD-10-CM

## 2017-12-10 DIAGNOSIS — E291 Testicular hypofunction: Secondary | ICD-10-CM | POA: Diagnosis not present

## 2017-12-10 DIAGNOSIS — E1169 Type 2 diabetes mellitus with other specified complication: Secondary | ICD-10-CM | POA: Diagnosis not present

## 2017-12-10 DIAGNOSIS — E669 Obesity, unspecified: Secondary | ICD-10-CM | POA: Diagnosis not present

## 2017-12-10 DIAGNOSIS — E782 Mixed hyperlipidemia: Secondary | ICD-10-CM | POA: Diagnosis not present

## 2017-12-10 NOTE — Progress Notes (Signed)
BP 136/74   Pulse 77   Temp 98.4 F (36.9 C) (Oral)   Ht 6' (1.829 m)   Wt 238 lb 4.8 oz (108.1 kg)   SpO2 98%   BMI 32.32 kg/m    Subjective:    Patient ID: Karl Morgan, male    DOB: 1960-07-11, 57 y.o.   MRN: [id number]  HPI: Karl Morgan is a 57 y.o. male  Chief Complaint  Patient presents with  . Follow-up    HPI Patient is here for f/u He had labs done at the hospital last week and we reviewed them in detail  Type 2 diabetes mellitus; last glucose at the hospital was 172 Lab Results  Component Value Date   HGBA1C 6.6 (H) 12/05/2017  Urine microalb:Cr normal  Creatinine bumped up just a bit to 1.25; normal potassium  HDL was only 26 at the last draw; he is on testosterone; this is a drop from 34; he will try to be more consistent with injections; works and active and body will be aching at the end of the day, doing projects and things like that, weekend warrior; he will try to use stairs  Blood in stool had stopped; hard stool, hard to pass, little fissure; GI checked him out and it was good; started Miralax and benefiber; drinks soda, lots of that with ice  Protein level back to normal; normal INR  Depression screen [facility] 2/9 12/10/2017 08/22/2017 02/25/2017 10/16/2016 07/17/2016  Decreased Interest 0 0 0 0 0  Down, Depressed, Hopeless 0 0 0 0 0  PHQ - 2 Score 0 0 0 0 0    Relevant past medical, surgical, family and social history reviewed Past Medical History:  Diagnosis Date  . Alopecia 04/04/2011  . Diabetes mellitus   . Erectile dysfunction 04/04/2011  . Heart murmur   . Hyperlipidemia 04/04/2011  . Hypogonadism male 04/04/2011  . Myocardial infarction ([place])   . Stroke ([place])   . Substance abuse ([place])   . Type II or unspecified type diabetes mellitus without mention of complication, uncontrolled 04/04/2011  . Unspecified essential hypertension 04/04/2011   Past Surgical History:  Procedure Laterality Date  . CARDIAC CATHETERIZATION     Family History    Problem Relation Age of Onset  . Hypertension Father   . Dementia Father   . Emphysema Mother   . Cancer Neg Hx   . Diabetes Neg Hx   . Heart disease Neg Hx   . Stroke Neg Hx    Social History   Tobacco Use  . Smoking status: Never Smoker  . Smokeless tobacco: Never Used  Substance Use Topics  . Alcohol use: No  . Drug use: No    Interim medical history since last visit reviewed. Allergies and medications reviewed  Review of Systems Per HPI unless specifically indicated above     Objective:    BP 136/74   Pulse 77   Temp 98.4 F (36.9 C) (Oral)   Ht 6' (1.829 m)   Wt 238 lb 4.8 oz (108.1 kg)   SpO2 98%   BMI 32.32 kg/m   Wt Readings from Last 3 Encounters:  12/10/17 238 lb 4.8 oz (108.1 kg)  08/22/17 236 lb 14.4 oz (107.5 kg)  02/25/17 238 lb 9.6 oz (108.2 kg)    Physical Exam  Constitutional: He appears well-developed and well-nourished. No distress.  HENT:  Head: Normocephalic and atraumatic.  Eyes: EOM are normal. No scleral icterus.  Neck: No thyromegaly present.  Cardiovascular: Normal rate and regular rhythm.  Pulmonary/Chest: Effort normal and breath sounds normal.  Abdominal: Soft. Bowel sounds are normal. He exhibits no distension.  Musculoskeletal: He exhibits no edema.  Neurological: Coordination normal.  Skin: Skin is warm and dry. No pallor.  Psychiatric: He has a normal mood and affect. His behavior is normal. Judgment and thought content normal.   Diabetic Foot Form - Detailed   Diabetic Foot Exam - detailed Diabetic Foot exam was performed with the following findings:  Yes 12/10/2017  8:45 AM  Visual Foot Exam completed.:  Yes  Pulse Foot Exam completed.:  Yes  Right Dorsalis Pedis:  Present Left Dorsalis Pedis:  Present  Sensory Foot Exam Completed.:  Yes Semmes-Weinstein Monofilament Test R Site 1-Great Toe:  Pos L Site 1-Great Toe:  Pos        Results for orders placed or performed during the hospital encounter of 12/05/17   Urine Microalbumin w/creat. ratio  Result Value Ref Range   Microalb, Ur 3.1 (H) Not Estab. ug/mL   Microalb Creat Ratio 2.6 0.0 - 30.0 mg/g creat   Creatinine, Urine 121.1 Not Estab. mg/dL  Comprehensive Metabolic Panel (CMET)  Result Value Ref Range   Sodium 140 135 - 145 mmol/L   Potassium 4.2 3.5 - 5.1 mmol/L   Chloride 110 98 - 111 mmol/L   CO2 24 22 - 32 mmol/L   Glucose, Bld 172 (H) 70 - 99 mg/dL   BUN 18 6 - 20 mg/dL   Creatinine, Ser 1.25 (H) 0.61 - 1.24 mg/dL   Calcium 8.8 (L) 8.9 - 10.3 mg/dL   Total Protein 6.5 6.5 - 8.1 g/dL   Albumin 4.0 3.5 - 5.0 g/dL   AST 22 15 - 41 U/L   ALT 25 0 - 44 U/L   Alkaline Phosphatase 62 38 - 126 U/L   Total Bilirubin 0.9 0.3 - 1.2 mg/dL   GFR calc non Af Amer >60 >60 mL/min   GFR calc Af Amer >60 >60 mL/min   Anion gap 6 5 - 15  Lipid panel  Result Value Ref Range   Cholesterol 113 0 - 200 mg/dL   Triglycerides 138 <150 mg/dL   HDL 26 (L) >40 mg/dL   Total CHOL/HDL Ratio 4.3 RATIO   VLDL 28 0 - 40 mg/dL   LDL Cholesterol 59 0 - 99 mg/dL  HgB A1c  Result Value Ref Range   Hgb A1c MFr Bld 6.6 (H) 4.8 - 5.6 %   Mean Plasma Glucose 142.72 mg/dL  INR/PT  Result Value Ref Range   Prothrombin Time 13.1 11.4 - 15.2 seconds   INR 1.00       Assessment & Plan:   Problem List Items Addressed This Visit      Cardiovascular and Mediastinum   Arteriosclerosis of coronary artery (Chronic)    Discussed the low HDL; encouraged modest weight loss to raise the HDL; discussed possible effect of testosterone on the HDL and he can talk to cardiologista bout that        Endocrine   Hypogonadism male (Chronic)    He is working with his urologist; explained that testosterone may adversely affect lipids, lower HDL; he may discuss with urologist and cardiologist      Diabetes mellitus type 2 in obese ([place])    Encouraged modest weight loss; foot exam by MD; A1c controlled; normal urine microalbumin:Cr; if working hard on days when he might  not eat, skip the sulfonylurea; okay to not check  FSBS with my blessing        Other   Obesity (BMI 30.0-34.9)    Encouraged weight loss      Hyperlipidemia (Chronic)    LDL at goal, but HDL too low; encuraged him to talk to his cardiologist      History of stroke    Goal LDL under 70; he is there          Follow up plan: No follow-ups on file.  An after-visit summary was printed and given to the patient at [place].  Please see the patient instructions which may contain other information and recommendations beyond what is mentioned above in the assessment and plan.  No orders of the defined types were placed in this encounter.   No orders of the defined types were placed in this encounter.

## 2017-12-10 NOTE — Assessment & Plan Note (Signed)
Goal LDL under 70; he is there

## 2017-12-10 NOTE — Patient Instructions (Addendum)
Recheck labs in 3 months and have labs done a few days prior "Just breathe" Try to increase steps and activity with Dr. Etta Quill blessing Talk with him about the low HDL Try to lose 5-10 pounds

## 2017-12-10 NOTE — Assessment & Plan Note (Signed)
Encouraged modest weight loss; foot exam by MD; A1c controlled; normal urine microalbumin:Cr; if working hard on days when he might not eat, skip the sulfonylurea; okay to not check FSBS with my blessing

## 2017-12-10 NOTE — Assessment & Plan Note (Signed)
LDL at goal, but HDL too low; encuraged him to talk to his cardiologist

## 2017-12-10 NOTE — Assessment & Plan Note (Signed)
He is working with his urologist; explained that testosterone may adversely affect lipids, lower HDL; he may discuss with urologist and cardiologist

## 2017-12-10 NOTE — Assessment & Plan Note (Signed)
Discussed the low HDL; encouraged modest weight loss to raise the HDL; discussed possible effect of testosterone on the HDL and he can talk to cardiologista bout that

## 2017-12-10 NOTE — Assessment & Plan Note (Signed)
Encouraged weight loss 

## 2017-12-26 DIAGNOSIS — H5213 Myopia, bilateral: Secondary | ICD-10-CM | POA: Diagnosis not present

## 2017-12-26 LAB — HM DIABETES EYE EXAM

## 2018-01-13 ENCOUNTER — Other Ambulatory Visit: Payer: Self-pay | Admitting: Family Medicine

## 2018-01-13 DIAGNOSIS — E118 Type 2 diabetes mellitus with unspecified complications: Secondary | ICD-10-CM

## 2018-01-13 NOTE — Telephone Encounter (Signed)
Reviewed GFR; Rx approved

## 2018-01-16 ENCOUNTER — Other Ambulatory Visit: Payer: Self-pay | Admitting: Otolaryngology

## 2018-01-16 DIAGNOSIS — J3489 Other specified disorders of nose and nasal sinuses: Secondary | ICD-10-CM | POA: Diagnosis not present

## 2018-01-16 DIAGNOSIS — J342 Deviated nasal septum: Secondary | ICD-10-CM | POA: Diagnosis not present

## 2018-01-16 DIAGNOSIS — R0981 Nasal congestion: Secondary | ICD-10-CM

## 2018-01-16 DIAGNOSIS — R51 Headache: Secondary | ICD-10-CM | POA: Diagnosis not present

## 2018-01-22 ENCOUNTER — Ambulatory Visit
Admission: RE | Admit: 2018-01-22 | Discharge: 2018-01-22 | Disposition: A | Discharge status: Home / Self Care | Payer: 59 | Source: Ambulatory Visit | Attending: Otolaryngology | Admitting: Otolaryngology

## 2018-01-22 DIAGNOSIS — J342 Deviated nasal septum: Secondary | ICD-10-CM | POA: Insufficient documentation

## 2018-01-22 DIAGNOSIS — R0981 Nasal congestion: Secondary | ICD-10-CM

## 2018-01-22 DIAGNOSIS — J3489 Other specified disorders of nose and nasal sinuses: Secondary | ICD-10-CM | POA: Diagnosis not present

## 2018-01-27 DIAGNOSIS — J324 Chronic pansinusitis: Secondary | ICD-10-CM | POA: Diagnosis not present

## 2018-01-27 DIAGNOSIS — J301 Allergic rhinitis due to pollen: Secondary | ICD-10-CM | POA: Diagnosis not present

## 2018-03-12 ENCOUNTER — Ambulatory Visit: Payer: 59 | Admitting: Family Medicine

## 2018-03-20 ENCOUNTER — Ambulatory Visit: Payer: 59 | Admitting: Family Medicine

## 2018-03-20 ENCOUNTER — Encounter: Payer: Self-pay | Admitting: Family Medicine

## 2018-03-20 VITALS — BP 122/78 | HR 74 | Temp 97.7°F | Ht 72.0 in | Wt 243.6 lb

## 2018-03-20 DIAGNOSIS — E1169 Type 2 diabetes mellitus with other specified complication: Secondary | ICD-10-CM

## 2018-03-20 DIAGNOSIS — E782 Mixed hyperlipidemia: Secondary | ICD-10-CM

## 2018-03-20 DIAGNOSIS — I2510 Atherosclerotic heart disease of native coronary artery without angina pectoris: Secondary | ICD-10-CM | POA: Diagnosis not present

## 2018-03-20 DIAGNOSIS — E118 Type 2 diabetes mellitus with unspecified complications: Secondary | ICD-10-CM

## 2018-03-20 DIAGNOSIS — Z8673 Personal history of transient ischemic attack (TIA), and cerebral infarction without residual deficits: Secondary | ICD-10-CM | POA: Diagnosis not present

## 2018-03-20 DIAGNOSIS — B009 Herpesviral infection, unspecified: Secondary | ICD-10-CM | POA: Diagnosis not present

## 2018-03-20 DIAGNOSIS — I10 Essential (primary) hypertension: Secondary | ICD-10-CM

## 2018-03-20 DIAGNOSIS — E669 Obesity, unspecified: Secondary | ICD-10-CM

## 2018-03-20 DIAGNOSIS — Z5181 Encounter for therapeutic drug level monitoring: Secondary | ICD-10-CM | POA: Diagnosis not present

## 2018-03-20 NOTE — Assessment & Plan Note (Signed)
He'll be working on this

## 2018-03-20 NOTE — Patient Instructions (Signed)
Consider L-lysine or B complex vitamins to help prevent some of the fever blisters Check out the information at familydoctor.org entitled "Nutrition for Weight Loss: What You Need to Know about Fad Diets" Try to lose between 1-2 pounds per week by taking in fewer calories and burning off more calories You can succeed by limiting portions, limiting foods dense in calories and fat, becoming more active, and drinking 8 glasses of water a day (64 ounces) Don't skip meals, especially breakfast, as skipping meals may alter your metabolism Do not use over-the-counter weight loss pills or gimmicks that claim rapid weight loss A healthy BMI (or body mass index) is between 18.5 and 24.9 You can calculate your ideal BMI at the [place] website ClubMonetize.fr Try to follow the DASH guidelines (DASH stands for Dietary Approaches to Stop Hypertension). Try to limit the sodium in your diet to no more than 1,500mg  of sodium per day. Certainly try to not exceed 2,000 mg per day at the very most. Do not add salt when cooking or at the table.  Check the sodium amount on labels when shopping, and choose items lower in sodium when given a choice. Avoid or limit foods that already contain a lot of sodium. Eat a diet rich in fruits and vegetables and whole grains, and try to lose weight if overweight or obese Try to limit saturated fats in your diet (bologna, hot dogs, barbeque, cheeseburgers, hamburgers, steak, bacon, sausage, cheese, etc.) and get more fresh fruits, vegetables, and whole grains

## 2018-03-20 NOTE — Assessment & Plan Note (Signed)
Asx; continue to f/u with cardiologist; work to lose weight, control sugars, pressure, lipids

## 2018-03-20 NOTE — Assessment & Plan Note (Signed)
Valtrex PRN; consider L-lysine or B complex vitamin

## 2018-03-20 NOTE — Progress Notes (Signed)
BP 122/78   Pulse 74   Temp 97.7 F (36.5 C) (Oral)   Ht 6' (1.829 m)   Wt 243 lb 9.6 oz (110.5 kg)   SpO2 98%   BMI 33.04 kg/m    Subjective:    Patient ID: Karl Morgan, male    DOB: [birth year], 57 y.o.   MRN: [id number]  HPI: Karl Morgan is a 57 y.o. male  Chief Complaint  Patient presents with  . 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 Pryor Ochoa; would need to come off plavix and not worth the risk right now to patient  Fever blister outbreaks occasionally; takes them early and it wipes them out  Depression screen [facility] 2/9 03/20/2018 04/20/2016 09/22/2015 07/05/2015 06/22/2015  Decreased Interest 0 0 0 0 0  Down, Depressed, Hopeless 0 0 0 0 0  PHQ - 2 Score 0 0 0 0 0  Altered sleeping 0 - - - -  Tired, decreased energy 0 - - - -  Change in appetite 0 - - - -  Feeling bad or failure about yourself  0 - - - -  Trouble concentrating 0 - - - -  Moving slowly or fidgety/restless 0 - - - -  PHQ-9 Score 0 - - - -  Difficult doing work/chores Not difficult at all - - - -  Some encounter information is confidential and restricted. Go to Review Flowsheets activity to see all data.   Fall Risk   03/20/2018 04/20/2016 09/22/2015 07/05/2015 06/22/2015  Falls in the past year? 0 No No No No  Number falls in past yr: 0 - - - -  Injury with Fall? 0 - - - -  Some encounter information is confidential and restricted. Go to Review Flowsheets activity to see all data.    Relevant past medical, surgical, family and social history reviewed Past Medical History:  Diagnosis Date  . Alopecia 04/04/2011  . Diabetes mellitus   . Erectile dysfunction 04/04/2011  . Heart murmur   . Hyperlipidemia 04/04/2011  . Hypogonadism male 04/04/2011  . Myocardial infarction ([place])   . Stroke ([place])   . Substance abuse ([place])   . Type II or unspecified type diabetes mellitus without mention of complication, uncontrolled 04/04/2011  . Unspecified essential hypertension 04/04/2011   Past Surgical History:  Procedure Laterality Date  . CARDIAC CATHETERIZATION     Family History  Problem Relation Age of Onset  . Hypertension Father   . Dementia Father   . Emphysema Mother   . Cancer Neg Hx   . Diabetes Neg Hx   .  Heart disease Neg Hx   . Stroke Neg Hx    Social History   Tobacco Use  . Smoking status: Never Smoker  . Smokeless tobacco: Never Used  Substance Use Topics  . Alcohol use: No  . Drug use: No     Office Visit from 03/20/2018 in [facility]  AUDIT-C Score  0      Interim medical history since last visit reviewed. Allergies and medications reviewed  Review of Systems Per HPI unless specifically indicated above     Objective:    BP 122/78   Pulse 74   Temp 97.7 F (36.5 C) (Oral)   Ht 6' (1.829 m)   Wt 243 lb 9.6 oz (110.5 kg)   SpO2 98%   BMI 33.04 kg/m   Wt Readings from Last 3 Encounters:  03/20/18 243 lb 9.6 oz (110.5 kg)  05/03/16 245 lb 14.4 oz (111.5 kg)  04/20/16 245 lb 11.2 oz (111.4 kg)    Physical Exam Constitutional:      General: He is not in acute distress.    Appearance: He is well-developed.  HENT:     Head: Normocephalic and atraumatic.    Eyes:     General: No scleral icterus. Neck:     Thyroid: No thyromegaly.  Cardiovascular:     Rate and Rhythm: Normal rate and regular rhythm.  Pulmonary:     Effort: Pulmonary effort is normal.     Breath sounds: Normal breath sounds.  Abdominal:     General: Bowel sounds are normal. There is no distension.     Palpations: Abdomen is soft.  Skin:    General: Skin is warm and dry.     Coloration: Skin is not pale.  Neurological:     Coordination: Coordination normal.  Psychiatric:        Behavior: Behavior normal.        Thought Content: Thought content normal.        Judgment: Judgment normal.     Results for orders placed or performed in visit on 12/30/17  HM DIABETES EYE EXAM  Result Value Ref Range   HM Diabetic Eye Exam No Retinopathy No Retinopathy      Assessment & Plan:   Problem List Items Addressed This Visit      Cardiovascular and Mediastinum   Essential hypertension (Chronic)    Continue ACE-I, beta-blocker; he'll be working on weight loss      Arteriosclerosis of coronary artery (Chronic)    Asx; continue to f/u with cardiologist; work to lose weight, control sugars, pressure, lipids        Endocrine   Diabetes mellitus type 2, controlled, with complications ([place]) (Chronic)   Diabetes mellitus type 2 in obese ([place])    Foot exam by MD today; last A1c readings show good control; check A1c 6 months from last; he'll try to lose weight      Relevant Orders   Hemoglobin A1c     Other   Obesity (BMI 30.0-34.9)    He'll be working on this      Hyperlipidemia (Chronic)    Goal LDL less than 70; discussed his low HDL; he'll work on weight loss      Relevant Orders   Lipid panel   History of stroke    On aspirin and plavix      Herpes simplex type 1 infection    Valtrex PRN; consider L-lysine or B complex vitamin  Other Visit Diagnoses    Medication monitoring encounter    -  Primary   Relevant Orders   CBC with  Differential/Platelet   Comprehensive Metabolic Panel (CMET)       Follow up plan: Return in about 3 months (around 06/06/2018) for labs only, then visit with Dr. Sanda Klein several days later.  An after-visit summary was printed and given to the patient at [facility].  Please see the patient instructions which may contain other information and recommendations beyond what is mentioned above in the assessment and plan.  No orders of the defined types were placed in this encounter.   Orders Placed This Encounter  Procedures  . CBC with Differential/Platelet  . Lipid panel  . Hemoglobin A1c  . Comprehensive Metabolic Panel (CMET)

## 2018-03-20 NOTE — Assessment & Plan Note (Signed)
Continue ACE-I, beta-blocker; he'll be working on weight loss

## 2018-03-20 NOTE — Assessment & Plan Note (Deleted)
Foot exam by MD today; last A1c readings show good control; check A1c 6 months from last; he'll try to lose weight

## 2018-03-20 NOTE — Assessment & Plan Note (Signed)
Goal LDL less than 70; discussed his low HDL; he'll work on weight loss

## 2018-03-20 NOTE — Assessment & Plan Note (Signed)
Foot exam by MD today; last A1c readings show good control; check A1c 6 months from last; he'll try to lose weight

## 2018-03-20 NOTE — Assessment & Plan Note (Signed)
On aspirin and plavix.  

## 2018-03-25 ENCOUNTER — Other Ambulatory Visit: Payer: Self-pay | Admitting: Family Medicine

## 2018-03-25 DIAGNOSIS — E118 Type 2 diabetes mellitus with unspecified complications: Secondary | ICD-10-CM

## 2018-04-28 ENCOUNTER — Other Ambulatory Visit: Payer: Self-pay | Admitting: Family Medicine

## 2018-04-28 DIAGNOSIS — E7800 Pure hypercholesterolemia, unspecified: Secondary | ICD-10-CM

## 2018-05-08 ENCOUNTER — Other Ambulatory Visit: Payer: Self-pay | Admitting: Family Medicine

## 2018-05-08 DIAGNOSIS — E118 Type 2 diabetes mellitus with unspecified complications: Secondary | ICD-10-CM

## 2018-05-08 NOTE — Telephone Encounter (Signed)
Lab Results  Component Value Date   CREATININE 1.25 (H) 12/05/2017

## 2018-06-24 ENCOUNTER — Ambulatory Visit: Payer: 59 | Admitting: Family Medicine

## 2018-07-16 ENCOUNTER — Other Ambulatory Visit: Payer: Self-pay | Admitting: Family Medicine

## 2018-07-16 DIAGNOSIS — E118 Type 2 diabetes mellitus with unspecified complications: Secondary | ICD-10-CM

## 2018-07-17 NOTE — Telephone Encounter (Signed)
Please remind patient that he was due for labs in early March. We are aware of the Coronavirus outbreak causing many patients to be wary of having office visits and labs drawn. If he is comfortable getting labs done, we would appreciate that. If he wants to wait another few weeks, we understand. Just a reminder that his medicines do require monitoring.  Thank you I'll refill med

## 2018-07-18 ENCOUNTER — Other Ambulatory Visit
Admission: RE | Admit: 2018-07-18 | Discharge: 2018-07-18 | Disposition: A | Discharge status: Home / Self Care | Payer: 59 | Attending: Family Medicine | Admitting: Family Medicine

## 2018-07-18 ENCOUNTER — Other Ambulatory Visit: Payer: Self-pay

## 2018-07-18 DIAGNOSIS — E1169 Type 2 diabetes mellitus with other specified complication: Secondary | ICD-10-CM | POA: Insufficient documentation

## 2018-07-18 DIAGNOSIS — E782 Mixed hyperlipidemia: Secondary | ICD-10-CM | POA: Diagnosis not present

## 2018-07-18 DIAGNOSIS — Z5181 Encounter for therapeutic drug level monitoring: Secondary | ICD-10-CM | POA: Insufficient documentation

## 2018-07-18 DIAGNOSIS — E669 Obesity, unspecified: Secondary | ICD-10-CM | POA: Insufficient documentation

## 2018-07-18 LAB — CBC WITH DIFFERENTIAL/PLATELET
Abs Immature Granulocytes: 0.02 10*3/uL (ref 0.00–0.07)
Basophils Absolute: 0 10*3/uL (ref 0.0–0.1)
Basophils Relative: 1 %
Eosinophils Absolute: 0.2 10*3/uL (ref 0.0–0.5)
Eosinophils Relative: 2 %
HCT: 44.7 % (ref 39.0–52.0)
Hemoglobin: 15.5 g/dL (ref 13.0–17.0)
Immature Granulocytes: 0 %
Lymphocytes Relative: 18 %
Lymphs Abs: 1.5 10*3/uL (ref 0.7–4.0)
MCH: 32.2 pg (ref 26.0–34.0)
MCHC: 34.7 g/dL (ref 30.0–36.0)
MCV: 92.9 fL (ref 80.0–100.0)
Monocytes Absolute: 0.7 10*3/uL (ref 0.1–1.0)
Monocytes Relative: 9 %
Neutro Abs: 6.1 10*3/uL (ref 1.7–7.7)
Neutrophils Relative %: 70 %
Platelets: 157 10*3/uL (ref 150–400)
RBC: 4.81 MIL/uL (ref 4.22–5.81)
RDW: 12.2 % (ref 11.5–15.5)
WBC: 8.6 10*3/uL (ref 4.0–10.5)
nRBC: 0 % (ref 0.0–0.2)

## 2018-07-18 LAB — COMPREHENSIVE METABOLIC PANEL
ALT: 32 U/L (ref 0–44)
AST: 21 U/L (ref 15–41)
Albumin: 4.1 g/dL (ref 3.5–5.0)
Alkaline Phosphatase: 88 U/L (ref 38–126)
Anion gap: 10 (ref 5–15)
BUN: 15 mg/dL (ref 6–20)
CO2: 23 mmol/L (ref 22–32)
Calcium: 9.2 mg/dL (ref 8.9–10.3)
Chloride: 101 mmol/L (ref 98–111)
Creatinine, Ser: 1.01 mg/dL (ref 0.61–1.24)
GFR calc Af Amer: >60 mL/min (ref >60)
GFR calc non Af Amer: >60 mL/min (ref >60)
Glucose, Bld: 236 mg/dL — ABNORMAL HIGH (ref 70–99)
Potassium: 4.3 mmol/L (ref 3.5–5.1)
Sodium: 134 mmol/L — ABNORMAL LOW (ref 135–145)
Total Bilirubin: 0.9 mg/dL (ref 0.3–1.2)
Total Protein: 6.6 g/dL (ref 6.5–8.1)

## 2018-07-18 LAB — HEMOGLOBIN A1C
Hgb A1c MFr Bld: 7.5 % — ABNORMAL HIGH (ref 4.8–5.6)
Mean Plasma Glucose: 168.55 mg/dL

## 2018-07-18 LAB — LIPID PANEL
Cholesterol: 128 mg/dL (ref 0–200)
HDL: 26 mg/dL — ABNORMAL LOW (ref >40)
LDL Cholesterol: 80 mg/dL (ref 0–99)
Total CHOL/HDL Ratio: 4.9 RATIO
Triglycerides: 110 mg/dL (ref <150)
VLDL: 22 mg/dL (ref 0–40)

## 2018-07-18 NOTE — Telephone Encounter (Signed)
Pt.notified

## 2018-07-22 ENCOUNTER — Encounter: Payer: Self-pay | Admitting: Family Medicine

## 2018-07-22 ENCOUNTER — Other Ambulatory Visit: Payer: Self-pay

## 2018-07-22 ENCOUNTER — Ambulatory Visit (INDEPENDENT_AMBULATORY_CARE_PROVIDER_SITE_OTHER): Payer: 59 | Admitting: Family Medicine

## 2018-07-22 VITALS — Ht 72.0 in | Wt 241.7 lb

## 2018-07-22 DIAGNOSIS — E118 Type 2 diabetes mellitus with unspecified complications: Secondary | ICD-10-CM | POA: Diagnosis not present

## 2018-07-22 DIAGNOSIS — E669 Obesity, unspecified: Secondary | ICD-10-CM

## 2018-07-22 DIAGNOSIS — I10 Essential (primary) hypertension: Secondary | ICD-10-CM

## 2018-07-22 DIAGNOSIS — I2510 Atherosclerotic heart disease of native coronary artery without angina pectoris: Secondary | ICD-10-CM | POA: Diagnosis not present

## 2018-07-22 DIAGNOSIS — Z5181 Encounter for therapeutic drug level monitoring: Secondary | ICD-10-CM | POA: Diagnosis not present

## 2018-07-22 DIAGNOSIS — E1169 Type 2 diabetes mellitus with other specified complication: Secondary | ICD-10-CM | POA: Diagnosis not present

## 2018-07-22 DIAGNOSIS — E782 Mixed hyperlipidemia: Secondary | ICD-10-CM

## 2018-07-22 MED ORDER — METOPROLOL SUCCINATE ER 50 MG PO TB24: 50.0000 mg | ORAL_TABLET | Freq: Every day | ORAL | 3 refills | Status: DC

## 2018-07-22 MED ORDER — METFORMIN HCL 850 MG PO TABS: 850.0000 mg | ORAL_TABLET | Freq: Every day | ORAL | 1 refills | Status: DC

## 2018-07-22 NOTE — Assessment & Plan Note (Signed)
Discussed options, staying on statin and adding zetia and adding injectable; he wants to do better eating and recheck in 3 months and then agrees to adjust medicine at that time if needed

## 2018-07-22 NOTE — Assessment & Plan Note (Signed)
Patient's LDL is not to goal; explained risk of heart attack, witih higher than ideal LDL and low HDL; offered adding zetia or adding injectable (PCSK9 inhibitor); he declined; he wants to stay right where he is on 80 mg of atorvastatin and try to watch his diet and eat better; he knows he has been controlled (LDL 59) on regimen with better eating, so he wants to try to change his lifestyle and recheck labs in 3 months; if still not to goal then, he agrees to adjust medicine

## 2018-07-22 NOTE — Progress Notes (Signed)
Ht 6' (1.829 m)   Wt 241 lb 11.2 oz (109.6 kg)   BMI 32.78 kg/m    Subjective:    Patient ID: Karl Morgan, male    DOB: 1960-06-15, 58 y.o.   MRN: [id number]  HPI: Karl Morgan is a 58 y.o. male  Chief Complaint  Patient presents with  . Follow-up    HPI Virtual Visit via Telephone/Video Note   I connected with the patient via:  Doximity video I verified that I am speaking with the correct person using two identifiers.  Call started: 12:10 pm Call terminated: 12:36 pm Total length of call: 36 minutes   I discussed the limitations, risks, and privacy concerns of performing an evaluation and management service by telephone and the availability of in-person appointments. I explained that he/she may be responsible for charges related to this service. The patient expressed understanding and agreed to proceed.  Provider location: home, upstairs office with door closed, earphones/headset on Patient location: office Additional participants: no one  Patient recently had labs done and we are reviewing those today  Type 2 diabetes he has not been eating well these last few months; he does not check his FSBS; he can tell when it goes low; lots of stress eating, ice cream at night; A1c 7.5; he has gained 10 pounds since last June; 5 pounds over last few months; he has had some thirst  High cholesterol; "I've eaten horribly the last two mnths"; he stopping and grabbing hot dogs and hamburgers; anything he can eat; he can improve it  Hypertension; all stress induced; checks BP every day; this morning 138/86; at work, 150/91; you wouldn't believe what's going on here" with COVID-19; they are asking him for plans to do more with less; he thinks it will get better; dealing with his mother who is   Recent loss of his father; it was dementia, long term issue; he was on comfort care and died at home; he was a shell of a man; he is doing okay 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 Go to Review Flowsheets activity to see all data.   Fall Risk  07/22/2018 03/20/2018 04/20/2016 09/22/2015 07/05/2015  Falls in the past year? 0 0 No No No  Number falls in past yr: - 0 - - -  Injury with Fall? - 0 - - -  Some encounter information is confidential and restricted. Go to Review Flowsheets activity to see all data.    Relevant past medical, surgical, family and social history reviewed Past Medical History:  Diagnosis Date  . Alopecia 04/04/2011  . Diabetes mellitus   . Erectile dysfunction 04/04/2011  . Heart murmur   . Hyperlipidemia 04/04/2011  . Hypogonadism male 04/04/2011  . Myocardial infarction ([place])   .  Stroke ([place])   . Substance abuse ([place])   . Type II or unspecified type diabetes mellitus without mention of complication, uncontrolled 04/04/2011  . Unspecified essential hypertension 04/04/2011   Past Surgical History:  Procedure Laterality Date  . CARDIAC CATHETERIZATION     Family History  Problem Relation Age of Onset  . Hypertension Father   . Dementia Father   . Emphysema Mother   . Cancer Neg Hx   . Diabetes Neg Hx   . Heart disease Neg Hx   . Stroke Neg Hx    Social History   Tobacco Use  . Smoking status: Never Smoker  . Smokeless tobacco: Never Used  Substance Use Topics  . Alcohol use: No  . Drug use: No     Office Visit from 07/22/2018 in [facility]  AUDIT-C Score  0      Interim  medical history since last visit reviewed. Allergies and medications reviewed  Review of Systems  Constitutional: Positive for unexpected weight change.  Cardiovascular: Negative for chest pain.   Per HPI unless specifically indicated above     Objective:    Ht 6' (1.829 m)   Wt 241 lb 11.2 oz (109.6 kg)   BMI 32.78 kg/m   Wt Readings from Last 3 Encounters:  07/22/18 241 lb 11.2 oz (109.6 kg)  03/20/18 243 lb 9.6 oz (110.5 kg)  05/03/16 245 lb 14.4 oz (111.5 kg)    Physical Exam Constitutional:      General: He is not in acute distress. Eyes:     General: No scleral icterus. Pulmonary:     Effort: No tachypnea or respiratory distress.  Skin:    Coloration: Skin is not jaundiced or pale.  Neurological:     Mental Status: He is alert.  Psychiatric:        Mood and Affect: Mood is not anxious or depressed.        Speech: Speech is not rapid and pressured, delayed or slurred.        Behavior: Behavior normal.        Cognition and Memory: Cognition normal.     Results for orders placed or performed during the hospital encounter of 07/18/18  Comprehensive Metabolic Panel (CMET)  Result Value Ref Range   Sodium 134 (L) 135 - 145 mmol/L   Potassium 4.3 3.5 - 5.1 mmol/L   Chloride 101 98 - 111 mmol/L   CO2 23 22 - 32 mmol/L   Glucose, Bld 236 (H) 70 - 99 mg/dL   BUN 15 6 - 20 mg/dL   Creatinine, Ser 1.01 0.61 - 1.24 mg/dL   Calcium 9.2 8.9 - 10.3 mg/dL   Total Protein 6.6 6.5 - 8.1 g/dL   Albumin 4.1 3.5 - 5.0 g/dL   AST 21 15 - 41 U/L   ALT 32 0 - 44 U/L   Alkaline Phosphatase 88 38 - 126 U/L   Total Bilirubin 0.9 0.3 - 1.2 mg/dL   GFR calc non Af Amer >60 >60 mL/min   GFR calc Af Amer >60 >60 mL/min   Anion gap 10 5 - 15  Hemoglobin A1c  Result Value Ref Range   Hgb A1c MFr Bld 7.5 (H) 4.8 - 5.6 %   Mean Plasma Glucose 168.55 mg/dL  Lipid panel  Result Value Ref Range   Cholesterol 128 0 - 200 mg/dL   Triglycerides 110 <150 mg/dL   HDL 26 (L) >40 mg/dL    Total CHOL/HDL Ratio 4.9  RATIO   VLDL 22 0 - 40 mg/dL   LDL Cholesterol 80 0 - 99 mg/dL  CBC with Differential/Platelet  Result Value Ref Range   WBC 8.6 4.0 - 10.5 K/uL   RBC 4.81 4.22 - 5.81 MIL/uL   Hemoglobin 15.5 13.0 - 17.0 g/dL   HCT 44.7 39.0 - 52.0 %   MCV 92.9 80.0 - 100.0 fL   MCH 32.2 26.0 - 34.0 pg   MCHC 34.7 30.0 - 36.0 g/dL   RDW 12.2 11.5 - 15.5 %   Platelets 157 150 - 400 K/uL   nRBC 0.0 0.0 - 0.2 %   Neutrophils Relative % 70 %   Neutro Abs 6.1 1.7 - 7.7 K/uL   Lymphocytes Relative 18 %   Lymphs Abs 1.5 0.7 - 4.0 K/uL   Monocytes Relative 9 %   Monocytes Absolute 0.7 0.1 - 1.0 K/uL   Eosinophils Relative 2 %   Eosinophils Absolute 0.2 0.0 - 0.5 K/uL   Basophils Relative 1 %   Basophils Absolute 0.0 0.0 - 0.1 K/uL   Immature Granulocytes 0 %   Abs Immature Granulocytes 0.02 0.00 - 0.07 K/uL      Assessment & Plan:   Problem List Items Addressed This Visit      Cardiovascular and Mediastinum   Essential hypertension (Chronic)    Suggested yoga or other relaxation activities to help with his stress      Relevant Medications   metoprolol succinate (TOPROL-XL) 50 MG 24 hr tablet   Arteriosclerosis of coronary artery (Chronic)    Patient's LDL is not to goal; explained risk of heart attack, witih higher than ideal LDL and low HDL; offered adding zetia or adding injectable (PCSK9 inhibitor); he declined; he wants to stay right where he is on 80 mg of atorvastatin and try to watch his diet and eat better; he knows he has been controlled (LDL 59) on regimen with better eating, so he wants to try to change his lifestyle and recheck labs in 3 months; if still not to goal then, he agrees to adjust medicine      Relevant Medications   metoprolol succinate (TOPROL-XL) 50 MG 24 hr tablet     Endocrine   Diabetes mellitus type 2, controlled, with complications (H[facility]) (Chronic)   Relevant Medications   metFORMIN (GLUCOPHAGE) 850 MG tablet   Diabetes mellitus type 2  in obese [facility]    He feels strongly about not adding medicine; wants to continue his current regimen and will try to do what he's supposed to      Relevant Medications   metFORMIN (GLUCOPHAGE) 850 MG tablet   Other Relevant Orders   Comprehensive metabolic panel   Hemoglobin A1c   Lipid panel     Other   Hyperlipidemia (Chronic)    Discussed options, staying on statin and adding zetia and adding injectable; he wants to do better eating and recheck in 3 months and then agrees to adjust medicine at that time if needed      Relevant Medications   metoprolol succinate (TOPROL-XL) 50 MG 24 hr tablet    Other Visit Diagnoses    Medication monitoring encounter    -  Primary   Relevant Orders   Comprehensive metabolic panel       Follow up plan: Return in about 3 months (around 10/21/2018) for follow-up visit with Dr. Sanda Klein, labs 2-5 days prior.  Meds ordered this encounter  Medications  . metFORMIN (GLUCOPHAGE) 850 MG tablet  Sig: Take 1 tablet (850 mg total) by mouth daily with breakfast.    Dispense:  90 tablet    Refill:  1  . metoprolol succinate (TOPROL-XL) 50 MG 24 hr tablet    Sig: Take 1 tablet (50 mg total) by mouth daily. Take with or immediately following a meal.    Dispense:  90 tablet    Refill:  3    Orders Placed This Encounter  Procedures  . Comprehensive metabolic panel  . Hemoglobin A1c  . Lipid panel

## 2018-07-22 NOTE — Assessment & Plan Note (Signed)
Suggested yoga or other relaxation activities to help with his stress

## 2018-07-22 NOTE — Assessment & Plan Note (Signed)
He feels strongly about not adding medicine; wants to continue his current regimen and will try to do what he's supposed to

## 2018-08-27 ENCOUNTER — Other Ambulatory Visit: Payer: Self-pay | Admitting: Family Medicine

## 2018-09-15 DIAGNOSIS — Z125 Encounter for screening for malignant neoplasm of prostate: Secondary | ICD-10-CM | POA: Diagnosis not present

## 2018-09-15 DIAGNOSIS — E291 Testicular hypofunction: Secondary | ICD-10-CM | POA: Diagnosis not present

## 2018-10-01 ENCOUNTER — Other Ambulatory Visit: Payer: Self-pay | Admitting: Family Medicine

## 2018-10-01 DIAGNOSIS — E118 Type 2 diabetes mellitus with unspecified complications: Secondary | ICD-10-CM

## 2018-10-01 NOTE — Telephone Encounter (Signed)
Pt called in and stated that he is out of this med.  He would like one more refill til he can make it to his new pt appt with Dr [facility]und[facility] pharmacy

## 2018-10-02 ENCOUNTER — Encounter: Payer: Self-pay | Admitting: Family Medicine

## 2018-10-21 ENCOUNTER — Ambulatory Visit: Payer: 59 | Admitting: Family Medicine

## 2018-10-21 ENCOUNTER — Other Ambulatory Visit: Payer: Self-pay

## 2018-10-21 ENCOUNTER — Encounter: Payer: Self-pay | Admitting: Internal Medicine

## 2018-10-21 ENCOUNTER — Ambulatory Visit (INDEPENDENT_AMBULATORY_CARE_PROVIDER_SITE_OTHER): Payer: 59 | Admitting: Internal Medicine

## 2018-10-21 DIAGNOSIS — I219 Acute myocardial infarction, unspecified: Secondary | ICD-10-CM | POA: Diagnosis not present

## 2018-10-21 DIAGNOSIS — E782 Mixed hyperlipidemia: Secondary | ICD-10-CM | POA: Diagnosis not present

## 2018-10-21 DIAGNOSIS — Z1159 Encounter for screening for other viral diseases: Secondary | ICD-10-CM

## 2018-10-21 DIAGNOSIS — Z8673 Personal history of transient ischemic attack (TIA), and cerebral infarction without residual deficits: Secondary | ICD-10-CM | POA: Diagnosis not present

## 2018-10-21 DIAGNOSIS — E1169 Type 2 diabetes mellitus with other specified complication: Secondary | ICD-10-CM

## 2018-10-21 DIAGNOSIS — Z13818 Encounter for screening for other digestive system disorders: Secondary | ICD-10-CM

## 2018-10-21 DIAGNOSIS — E559 Vitamin D deficiency, unspecified: Secondary | ICD-10-CM | POA: Diagnosis not present

## 2018-10-21 DIAGNOSIS — J329 Chronic sinusitis, unspecified: Secondary | ICD-10-CM | POA: Insufficient documentation

## 2018-10-21 DIAGNOSIS — E7800 Pure hypercholesterolemia, unspecified: Secondary | ICD-10-CM

## 2018-10-21 DIAGNOSIS — E669 Obesity, unspecified: Secondary | ICD-10-CM

## 2018-10-21 DIAGNOSIS — B009 Herpesviral infection, unspecified: Secondary | ICD-10-CM

## 2018-10-21 DIAGNOSIS — R809 Proteinuria, unspecified: Secondary | ICD-10-CM | POA: Diagnosis not present

## 2018-10-21 DIAGNOSIS — M1990 Unspecified osteoarthritis, unspecified site: Secondary | ICD-10-CM | POA: Diagnosis not present

## 2018-10-21 DIAGNOSIS — I10 Essential (primary) hypertension: Secondary | ICD-10-CM

## 2018-10-21 DIAGNOSIS — Z1329 Encounter for screening for other suspected endocrine disorder: Secondary | ICD-10-CM

## 2018-10-21 MED ORDER — VALACYCLOVIR HCL 1 G PO TABS: ORAL_TABLET | ORAL | 5 refills | Status: DC

## 2018-10-21 MED ORDER — ATORVASTATIN CALCIUM 80 MG PO TABS: 80.0000 mg | ORAL_TABLET | Freq: Every day | ORAL | 3 refills | Status: DC

## 2018-10-21 NOTE — Patient Instructions (Signed)
Please try sugar free ice cream Look into The next 56 days online nutritional plan not sure if there is a cost but has helped people reach health related goals I.e control diabetes, cholesterol, and blood pressure   Please make sure when you see a specialist they always fax me the note   Cinnamon capsules 500 mg 1-2 x per day may also help you blood sugars     Diabetes Mellitus and Nutrition, Adult When you have diabetes (diabetes mellitus), it is very important to have healthy eating habits because your blood sugar (glucose) levels are greatly affected by what you eat and drink. Eating healthy foods in the appropriate amounts, at about the same times every day, can help you:  Control your blood glucose.  Lower your risk of heart disease.  Improve your blood pressure.  Reach or maintain a healthy weight. Every person with diabetes is different, and each person has different needs for a meal plan. Your health care provider may recommend that you work with a diet and nutrition specialist (dietitian) to make a meal plan that is best for you. Your meal plan may vary depending on factors such as:  The calories you need.  The medicines you take.  Your weight.  Your blood glucose, blood pressure, and cholesterol levels.  Your activity level.  Other health conditions you have, such as heart or kidney disease. How do carbohydrates affect me? Carbohydrates, also called carbs, affect your blood glucose level more than any other type of food. Eating carbs naturally raises the amount of glucose in your blood. Carb counting is a method for keeping track of how many carbs you eat. Counting carbs is important to keep your blood glucose at a healthy level, especially if you use insulin or take certain oral diabetes medicines. It is important to know how many carbs you can safely have in each meal. This is different for every person. Your dietitian can help you calculate how many carbs you should  have at each meal and for each snack. Foods that contain carbs include:  Bread, cereal, rice, pasta, and crackers.  Potatoes and corn.  Peas, beans, and lentils.  Milk and yogurt.  Fruit and juice.  Desserts, such as cakes, cookies, ice cream, and candy. How does alcohol affect me? Alcohol can cause a sudden decrease in blood glucose (hypoglycemia), especially if you use insulin or take certain oral diabetes medicines. Hypoglycemia can be a life-threatening condition. Symptoms of hypoglycemia (sleepiness, dizziness, and confusion) are similar to symptoms of having too much alcohol. If your health care provider says that alcohol is safe for you, follow these guidelines:  Limit alcohol intake to no more than 1 drink per day for nonpregnant women and 2 drinks per day for men. One drink equals 12 oz of beer, 5 oz of wine, or 1 oz of hard liquor.  Do not drink on an empty stomach.  Keep yourself hydrated with water, diet soda, or unsweetened iced tea.  Keep in mind that regular soda, juice, and other mixers may contain a lot of sugar and must be counted as carbs. What are tips for following this plan?  Reading food labels  Start by checking the serving size on the "Nutrition Facts" label of packaged foods and drinks. The amount of calories, carbs, fats, and other nutrients listed on the label is based on one serving of the item. Many items contain more than one serving per package.  Check the total grams (g) of carbs  in one serving. You can calculate the number of servings of carbs in one serving by dividing the total carbs by 15. For example, if a food has 30 g of total carbs, it would be equal to 2 servings of carbs.  Check the number of grams (g) of saturated and trans fats in one serving. Choose foods that have low or no amount of these fats.  Check the number of milligrams (mg) of salt (sodium) in one serving. Most people should limit total sodium intake to less than 2,300 mg per  day.  Always check the nutrition information of foods labeled as "low-fat" or "nonfat". These foods may be higher in added sugar or refined carbs and should be avoided.  Talk to your dietitian to identify your daily goals for nutrients listed on the label. Shopping  Avoid buying canned, premade, or processed foods. These foods tend to be high in fat, sodium, and added sugar.  Shop around the outside edge of the grocery store. This includes fresh fruits and vegetables, bulk grains, fresh meats, and fresh dairy. Cooking  Use low-heat cooking methods, such as baking, instead of high-heat cooking methods like deep frying.  Cook using healthy oils, such as olive, canola, or sunflower oil.  Avoid cooking with butter, cream, or high-fat meats. Meal planning  Eat meals and snacks regularly, preferably at the same times every day. Avoid going long periods of time without eating.  Eat foods high in fiber, such as fresh fruits, vegetables, beans, and whole grains. Talk to your dietitian about how many servings of carbs you can eat at each meal.  Eat 4-6 ounces (oz) of lean protein each day, such as lean meat, chicken, fish, eggs, or tofu. One oz of lean protein is equal to: ? 1 oz of meat, chicken, or fish. ? 1 egg. ?  cup of tofu.  Eat some foods each day that contain healthy fats, such as avocado, nuts, seeds, and fish. Lifestyle  Check your blood glucose regularly.  Exercise regularly as told by your health care provider. This may include: ? 150 minutes of moderate-intensity or vigorous-intensity exercise each week. This could be brisk walking, biking, or water aerobics. ? Stretching and doing strength exercises, such as yoga or weightlifting, at least 2 times a week.  Take medicines as told by your health care provider.  Do not use any products that contain nicotine or tobacco, such as cigarettes and e-cigarettes. If you need help quitting, ask your health care provider.  Work with  a Social worker or diabetes educator to identify strategies to manage stress and any emotional and social challenges. Questions to ask a health care provider  Do I need to meet with a diabetes educator?  Do I need to meet with a dietitian?  What number can I call if I have questions?  When are the best times to check my blood glucose? Where to find more information:  American Diabetes Association: diabetes.org  Academy of Nutrition and Dietetics: www.eatright.[employer] of Diabetes and Digestive and Kidney Diseases (NIH): [employer] Summary  A healthy meal plan will help you control your blood glucose and maintain a healthy lifestyle.  Working with a diet and nutrition specialist (dietitian) can help you make a meal plan that is best for you.  Keep in mind that carbohydrates (carbs) and alcohol have immediate effects on your blood glucose levels. It is important to count carbs and to use alcohol carefully. This information is not intended to replace advice  given to you by your health care provider. Make sure you discuss any questions you have with your health care provider. Document Released: 12/14/2004 Document Revised: 03/01/2017 Document Reviewed: 04/23/2016 Elsevier Patient Education  2020 [employer].  Exercising to Lose Weight Exercise is structured, repetitive physical activity to improve fitness and health. Getting regular exercise is important for everyone. It is especially important if you are overweight. Being overweight increases your risk of heart disease, stroke, diabetes, high blood pressure, and several types of cancer. Reducing your calorie intake and exercising can help you lose weight. Exercise is usually categorized as moderate or vigorous intensity. To lose weight, most people need to do a certain amount of moderate-intensity or vigorous-intensity exercise each week. Moderate-intensity exercise  Moderate-intensity exercise is any activity that  gets you moving enough to burn at least three times more energy (calories) than if you were sitting. Examples of moderate exercise include:  Walking a mile in 15 minutes.  Doing light yard work.  Biking at an easy pace. Most people should get at least 150 minutes (2 hours and 30 minutes) a week of moderate-intensity exercise to maintain their body weight. Vigorous-intensity exercise Vigorous-intensity exercise is any activity that gets you moving enough to burn at least six times more calories than if you were sitting. When you exercise at this intensity, you should be working hard enough that you are not able to carry on a conversation. Examples of vigorous exercise include:  Running.  Playing a team sport, such as football, basketball, and soccer.  Jumping rope. Most people should get at least 75 minutes (1 hour and 15 minutes) a week of vigorous-intensity exercise to maintain their body weight. How can exercise affect me? When you exercise enough to burn more calories than you eat, you lose weight. Exercise also reduces body fat and builds muscle. The more muscle you have, the more calories you burn. Exercise also:  Improves mood.  Reduces stress and tension.  Improves your overall fitness, flexibility, and endurance.  Increases bone strength. The amount of exercise you need to lose weight depends on:  Your age.  The type of exercise.  Any health conditions you have.  Your overall physical ability. Talk to your health care provider about how much exercise you need and what types of activities are safe for you. What actions can I take to lose weight? Nutrition   Make changes to your diet as told by your health care provider or diet and nutrition specialist (dietitian). This may include: ? Eating fewer calories. ? Eating more protein. ? Eating less unhealthy fats. ? Eating a diet that includes fresh fruits and vegetables, whole grains, low-fat dairy products, and lean  protein. ? Avoiding foods with added fat, salt, and sugar.  Drink plenty of water while you exercise to prevent dehydration or heat stroke. Activity  Choose an activity that you enjoy and set realistic goals. Your health care provider can help you make an exercise plan that works for you.  Exercise at a moderate or vigorous intensity most days of the week. ? The intensity of exercise may vary from person to person. You can tell how intense a workout is for you by paying attention to your breathing and heartbeat. Most people will notice their breathing and heartbeat get faster with more intense exercise.  Do resistance training twice each week, such as: ? Push-ups. ? Sit-ups. ? Lifting weights. ? Using resistance bands.  Getting short amounts of exercise can be just as helpful  as long structured periods of exercise. If you have trouble finding time to exercise, try to include exercise in your daily routine. ? Get up, stretch, and walk around every 30 minutes throughout the day. ? Go for a walk during your lunch break. ? Park your car farther away from your destination. ? If you take public transportation, get off one stop early and walk the rest of the way. ? Make phone calls while standing up and walking around. ? Take the stairs instead of elevators or escalators.  Wear comfortable clothes and shoes with good support.  Do not exercise so much that you hurt yourself, feel dizzy, or get very short of breath. Where to find more information  U.S. Department of Health and Human Services: [employer].at  Centers for Disease Control and Prevention (CDC): http://www.wolf.info/ Contact a health care provider:  Before starting a new exercise program.  If you have questions or concerns about your weight.  If you have a medical problem that keeps you from exercising. Get help right away if you have any of the following while exercising:  Injury.  Dizziness.  Difficulty breathing or shortness of  breath that does not go away when you stop exercising.  Chest pain.  Rapid heartbeat. Summary  Being overweight increases your risk of heart disease, stroke, diabetes, high blood pressure, and several types of cancer.  Losing weight happens when you burn more calories than you eat.  Reducing the amount of calories you eat in addition to getting regular moderate or vigorous exercise each week helps you lose weight. This information is not intended to replace advice given to you by your health care provider. Make sure you discuss any questions you have with your health care provider. Document Released: 04/21/2010 Document Revised: 04/01/2017 Document Reviewed: 04/01/2017 Elsevier Patient Education  2020 [employer].

## 2018-10-21 NOTE — Progress Notes (Addendum)
Virtual Visit via Video Note  I connected with Karl Morgan   on 10/21/18 at  4:15 PM EDT by a video enabled telemedicine application and verified that I am speaking with the correct person using two identifiers.  Location patient: work [employer]  Persons participating in the virtual visit: patient, provider  I discussed the limitations of evaluation and management by telemedicine and the availability of in person appointments. The patient expressed understanding and agreed to proceed.   HPI: 1. HTN/HLD/CAD/stroke on lipitor 80 mg qhs, plavix 75 mg qd, aspirin 81 mg qd, lisinopril 10 mg qd, toprol xl 50 mg qd BP per pt runs 130s-140s/80s-90s disc with him goal with history <130/<80 and consider titration up of meds if BP not at goal for now he does not want to make medication changes  2. DM 2 A1c 7.5 07/18/2018 on metformin 850 mg qd when tried to go up had GI sxs on januvia 100 mg qd and amaryl 2 mg qd. He does like to eat ice cream and when stressed out does not make healthy diet choices at times. He has had sx's of low blood sugar at times which are nausea, lightheadedness so he eats candy when this happens  3. HSV on lips needs refill of valtrex which he takes prn 2 mg bid x 1 day   ROS: See pertinent positives and negatives per HPI. General: energy normal, weight stable  HEENT: no sore throat  CV: no chest pain Lungs: no sob  GI: no abdominal pain  MSK: no joint pain  GU+ED f/u [facility]  Skin: no issues due dermatology upcoming appt  Neuro: no h/a  Psych: no anxiety/depression   Past Medical History:  Diagnosis Date  . Alopecia 04/04/2011  . Diabetes mellitus   . Erectile dysfunction 04/04/2011  . Heart murmur   . Hyperlipidemia 04/04/2011  . Hypogonadism male 04/04/2011  . Myocardial infarction ([place])   . Stroke ([place])   . Substance abuse ([place])   . Type II or unspecified type diabetes mellitus without mention of complication, uncontrolled 04/04/2011  . Unspecified  essential hypertension 04/04/2011    Past Surgical History:  Procedure Laterality Date  . CARDIAC CATHETERIZATION      Family History  Problem Relation Age of Onset  . Hypertension Father   . Dementia Father        died age 59 in 06-25-17  . Memory loss Father   . Emphysema Mother   . COPD Mother   . Thyroid disease Mother   . Cancer Neg Hx   . Diabetes Neg Hx   . Heart disease Neg Hx   . Stroke Neg Hx     SOCIAL HX:  Married 2 kids daughter and son  Oldest of siblings has 2 brothers and 1 sister  Never smoker  Works Manufacturing systems engineer of [employer]   DPR wife Loletha Carrow   Wants to retire age 58 y.o and travel to [place] and [place]   Current Outpatient Medications:  .  Alprostadil, Vasodilator, (EDEX IC), 1 Dose by Intracavernosal route as needed (for erectile dysfunstion). Pt is unsure of the dose., Disp: , Rfl:  .  aspirin EC 81 MG tablet, Take 81 mg by mouth daily., Disp: , Rfl:  .  atorvastatin (LIPITOR) 80 MG tablet, Take 1 tablet (80 mg total) by mouth daily at 6 PM., Disp: 90 tablet, Rfl: 3 .  clopidogrel (PLAVIX) 75 MG tablet, Take 75 mg by mouth daily., Disp: , Rfl:  .  glimepiride (AMARYL) 2 MG tablet, TAKE 1 TABLET (2 MG TOTAL) BY MOUTH DAILY WITH BREAKFAST., Disp: 90 tablet, Rfl: 1 .  JANUVIA 100 MG tablet, TAKE 1 TABLET BY MOUTH DAILY., Disp: 90 tablet, Rfl: 1 .  lisinopril (ZESTRIL) 10 MG tablet, TAKE 1 TABLET (10 MG TOTAL) BY MOUTH DAILY., Disp: 90 tablet, Rfl: 3 .  metFORMIN (GLUCOPHAGE) 850 MG tablet, Take 1 tablet (850 mg total) by mouth daily with breakfast., Disp: 90 tablet, Rfl: 1 .  metoprolol succinate (TOPROL-XL) 50 MG 24 hr tablet, Take 1 tablet (50 mg total) by mouth daily. Take with or immediately following a meal., Disp: 90 tablet, Rfl: 3 .  nitroGLYCERIN (NITROSTAT) 0.4 MG SL tablet, Take 1 tablet under tongue as directed as needed [Take 1 tablet q5 minutes with max dose of 3 doses. If 3rd dose is needed, call 911], Disp: , Rfl:  .  sildenafil (VIAGRA) 100 MG  tablet, Take 0.5-1 tablets (50-100 mg total) by mouth daily as needed for erectile dysfunction., Disp: 5 tablet, Rfl: 11 .  testosterone cypionate (DEPOTESTOSTERONE CYPIONATE) 200 MG/ML injection, Inject 200 mg into the muscle every 7 (seven) days. Pt uses on Saturday., Disp: , Rfl:  .  valACYclovir (VALTREX) 1000 MG tablet, Take two pills (2,000 mg) by mouth at onset of oral fever blister, then two more pills twelve hours later x 1 day, Disp: 30 tablet, Rfl: 5  Current Facility-Administered Medications:  .  testosterone cypionate (DEPOTESTOTERONE CYPIONATE) injection 200 mg, 200 mg, Intramuscular, Q14 Days, Janith Lima, MD, 200 mg at 07/29/12 1635  EXAM:  VITALS per patient if applicable:  GENERAL: alert, oriented, appears well and in no acute distress  HEENT: atraumatic, conjunttiva clear, no obvious abnormalities on inspection of external nose and ears  NECK: normal movements of the head and neck  LUNGS: on inspection no signs of respiratory distress, breathing rate appears normal, no obvious gross SOB, gasping or wheezing  CV: no obvious cyanosis  MS: moves all visible extremities without noticeable abnormality  PSYCH/NEURO: pleasant and cooperative, no obvious depression or anxiety, speech and thought processing grossly intact  ASSESSMENT AND PLAN:  Discussed the following assessment and plan:  Essential hypertension  Mixed hyperlipidemia  Myocardial infarction, unspecified MI type, unspecified artery (HCC)  H/o stroke  -cont meds toprol 50 mg xl qd, plavix 75, aspirin 81 mg qd, lisinopril 10 mg qd if BP uncontrolled > goal or <130/,80 consider increase lisinopril to 20 mg  -refilled lipitor today 80 mg qhs  -will CC Dr. Clayborn Bigness cards appt upcoming   Diabetes mellitus type 2 in obese with proteinuria and hyperglycemia  A1C 7.5  -cont meds  Check fasting labs  -if A1C uncontrolled consider increase amaryl to 4 mg daily from 2 mg qd   HSV infection - Plan:  valACYclovir (VALTREX) 1000 MG tablet, 2 g bid prn outbreak refilled today   HM Flu shot at work  tdap had 06/25/15 Disc shingrix today to consider in the future   PSA, ED, low T with Dr. Tonette Bihari [facility] saw w/in the last few months 2020 get records  09/15/18 [facility] appt low T PSA 09/15/18 1.28, T was 1003.6  Colonoscopy 02/04/15 Dr. Melina Copa diverticulosis f/u in 5 years [place]    Prior PCP Dr. Manuella Ghazi, Dr. Sanda Klein  Dermatology Dr. Nehemiah Massed due to f/u in 10 or 02/2019  [facility] [place] [facility] due to see 11/2018   ENT Dr. Pryor Ochoa  Cards-Dr. Clayborn Bigness   I discussed the assessment and treatment  plan with the patient. The patient was provided an opportunity to ask questions and all were answered. The patient agreed with the plan and demonstrated an understanding of the instructions.   The patient was advised to call back or seek an in-person evaluation if the symptoms worsen or if the condition fails to improve as anticipated.  Time spent 25 minutes  Delorise Jackson, MD

## 2018-10-23 ENCOUNTER — Encounter: Payer: Self-pay | Admitting: Internal Medicine

## 2018-10-23 DIAGNOSIS — I2510 Atherosclerotic heart disease of native coronary artery without angina pectoris: Secondary | ICD-10-CM | POA: Diagnosis not present

## 2018-10-23 DIAGNOSIS — E669 Obesity, unspecified: Secondary | ICD-10-CM | POA: Diagnosis not present

## 2018-10-23 DIAGNOSIS — I209 Angina pectoris, unspecified: Secondary | ICD-10-CM | POA: Diagnosis not present

## 2018-10-23 DIAGNOSIS — I10 Essential (primary) hypertension: Secondary | ICD-10-CM | POA: Diagnosis not present

## 2018-10-28 ENCOUNTER — Other Ambulatory Visit
Admission: RE | Admit: 2018-10-28 | Discharge: 2018-10-28 | Disposition: A | Discharge status: Home / Self Care | Payer: 59 | Source: Ambulatory Visit | Attending: Internal Medicine | Admitting: Internal Medicine

## 2018-10-28 DIAGNOSIS — E1169 Type 2 diabetes mellitus with other specified complication: Secondary | ICD-10-CM | POA: Diagnosis not present

## 2018-10-28 DIAGNOSIS — Z5181 Encounter for therapeutic drug level monitoring: Secondary | ICD-10-CM | POA: Diagnosis not present

## 2018-10-28 DIAGNOSIS — E669 Obesity, unspecified: Secondary | ICD-10-CM | POA: Insufficient documentation

## 2018-10-28 LAB — CBC WITH DIFFERENTIAL/PLATELET
Abs Immature Granulocytes: 0.04 10*3/uL (ref 0.00–0.07)
Basophils Absolute: 0 10*3/uL (ref 0.0–0.1)
Basophils Relative: 1 %
Eosinophils Absolute: 0.2 10*3/uL (ref 0.0–0.5)
Eosinophils Relative: 3 %
HCT: 43.7 % (ref 39.0–52.0)
Hemoglobin: 15 g/dL (ref 13.0–17.0)
Immature Granulocytes: 1 %
Lymphocytes Relative: 18 %
Lymphs Abs: 1.4 10*3/uL (ref 0.7–4.0)
MCH: 32 pg (ref 26.0–34.0)
MCHC: 34.3 g/dL (ref 30.0–36.0)
MCV: 93.2 fL (ref 80.0–100.0)
Monocytes Absolute: 0.8 10*3/uL (ref 0.1–1.0)
Monocytes Relative: 10 %
Neutro Abs: 5.3 10*3/uL (ref 1.7–7.7)
Neutrophils Relative %: 67 %
Platelets: 142 10*3/uL — ABNORMAL LOW (ref 150–400)
RBC: 4.69 MIL/uL (ref 4.22–5.81)
RDW: 12.2 % (ref 11.5–15.5)
WBC: 7.8 10*3/uL (ref 4.0–10.5)
nRBC: 0 % (ref 0.0–0.2)

## 2018-10-28 LAB — COMPREHENSIVE METABOLIC PANEL
ALT: 31 U/L (ref 0–44)
AST: 27 U/L (ref 15–41)
Albumin: 4.2 g/dL (ref 3.5–5.0)
Alkaline Phosphatase: 68 U/L (ref 38–126)
Anion gap: 6 (ref 5–15)
BUN: 11 mg/dL (ref 6–20)
CO2: 23 mmol/L (ref 22–32)
Calcium: 8.9 mg/dL (ref 8.9–10.3)
Chloride: 108 mmol/L (ref 98–111)
Creatinine, Ser: 1 mg/dL (ref 0.61–1.24)
GFR calc Af Amer: >60 mL/min (ref >60)
GFR calc non Af Amer: >60 mL/min (ref >60)
Glucose, Bld: 140 mg/dL — ABNORMAL HIGH (ref 70–99)
Potassium: 4.1 mmol/L (ref 3.5–5.1)
Sodium: 137 mmol/L (ref 135–145)
Total Bilirubin: 1 mg/dL (ref 0.3–1.2)
Total Protein: 6.6 g/dL (ref 6.5–8.1)

## 2018-10-28 LAB — HEMOGLOBIN A1C
Hgb A1c MFr Bld: 7 % — ABNORMAL HIGH (ref 4.8–5.6)
Mean Plasma Glucose: 154.2 mg/dL

## 2018-12-29 DIAGNOSIS — H5213 Myopia, bilateral: Secondary | ICD-10-CM | POA: Diagnosis not present

## 2018-12-29 LAB — HM DIABETES EYE EXAM

## 2018-12-31 ENCOUNTER — Encounter: Payer: Self-pay | Admitting: Internal Medicine

## 2019-01-26 ENCOUNTER — Telehealth: Payer: Self-pay | Admitting: Internal Medicine

## 2019-01-26 ENCOUNTER — Other Ambulatory Visit: Payer: Self-pay | Admitting: Internal Medicine

## 2019-01-26 ENCOUNTER — Other Ambulatory Visit: Payer: Self-pay | Admitting: Family Medicine

## 2019-01-26 DIAGNOSIS — E118 Type 2 diabetes mellitus with unspecified complications: Secondary | ICD-10-CM

## 2019-01-26 MED ORDER — SITAGLIPTIN PHOSPHATE 100 MG PO TABS: 100.0000 mg | ORAL_TABLET | Freq: Every day | ORAL | 1 refills | Status: DC

## 2019-01-26 NOTE — Telephone Encounter (Signed)
Medication: JANUVIA 100 MG tablet [id number]   Has the patient contacted their pharmacy? Yes  (Agent: If no, request that the patient contact the pharmacy for the refill.) (Agent: If yes, when and what did the pharmacy advise?)  Preferred Pharmacy (with phone number or street name): [place], [place] [phone] (Phone) [phone] (Fax)    Agent: Please be advised that RX refills may take up to 3 business days. We ask that you follow-up with your pharmacy.

## 2019-02-24 ENCOUNTER — Other Ambulatory Visit: Payer: Self-pay

## 2019-02-24 ENCOUNTER — Encounter: Payer: Self-pay | Admitting: Internal Medicine

## 2019-02-24 ENCOUNTER — Ambulatory Visit (INDEPENDENT_AMBULATORY_CARE_PROVIDER_SITE_OTHER): Payer: 59 | Admitting: Internal Medicine

## 2019-02-24 VITALS — Ht 72.0 in | Wt 235.0 lb

## 2019-02-24 DIAGNOSIS — E118 Type 2 diabetes mellitus with unspecified complications: Secondary | ICD-10-CM

## 2019-02-24 DIAGNOSIS — E538 Deficiency of other specified B group vitamins: Secondary | ICD-10-CM

## 2019-02-24 DIAGNOSIS — F439 Reaction to severe stress, unspecified: Secondary | ICD-10-CM

## 2019-02-24 DIAGNOSIS — E782 Mixed hyperlipidemia: Secondary | ICD-10-CM | POA: Diagnosis not present

## 2019-02-24 DIAGNOSIS — E039 Hypothyroidism, unspecified: Secondary | ICD-10-CM | POA: Diagnosis not present

## 2019-02-24 DIAGNOSIS — E559 Vitamin D deficiency, unspecified: Secondary | ICD-10-CM | POA: Diagnosis not present

## 2019-02-24 DIAGNOSIS — I10 Essential (primary) hypertension: Secondary | ICD-10-CM

## 2019-02-24 DIAGNOSIS — Z0000 Encounter for general adult medical examination without abnormal findings: Secondary | ICD-10-CM

## 2019-02-24 DIAGNOSIS — Z13818 Encounter for screening for other digestive system disorders: Secondary | ICD-10-CM | POA: Diagnosis not present

## 2019-02-24 MED ORDER — LISINOPRIL 20 MG PO TABS: 20.0000 mg | ORAL_TABLET | Freq: Every day | ORAL | 3 refills | Status: DC

## 2019-02-24 NOTE — Progress Notes (Signed)
Virtual Visit via Video Note  I connected with Karl Morgan   on 02/24/19 at  4:00 PM EST by a video enabled telemedicine application and verified that I am speaking with the correct person using two identifiers.  Location patient:work  Location provider:work or home office Persons participating in the virtual visit: patient, provider  I discussed the limitations of evaluation and management by telemedicine and the availability of in person appointments. The patient expressed understanding and agreed to proceed.   HPI: 1. Annual  2. HTN uncontrolled esp when under stress at work can be 150s/90s avg 130s/80s he does have cuff he is c/w increase of BP meds causing ED which he f/u with urology about  On toprol 50 mg xl and lis 10 mg qd  3. Stress/anxiety declines medications to tx  4. DM 2 A1C 7.0 on amaryl 2 mg qd metformin 850 mg qd   ROS: See pertinent positives and negatives per HPI. General: wt stable  HEENT: no sore throat  CV: no chest pain  Lungs: no sob  GI: no ab pain  GU: +ED  MSK: no issues  Neuro: no h/a  Psych: +stress/anxiety  Skin : no issues    Past Medical History:  Diagnosis Date  . Alopecia 04/04/2011  . Diabetes mellitus   . Erectile dysfunction 04/04/2011  . Heart murmur   . Hyperlipidemia 04/04/2011  . Hypogonadism male 04/04/2011  . Myocardial infarction ([place])   . Stroke ([place])   . Substance abuse ([place])   . Type II or unspecified type diabetes mellitus without mention of complication, uncontrolled 04/04/2011  . Unspecified essential hypertension 04/04/2011    Past Surgical History:  Procedure Laterality Date  . CARDIAC CATHETERIZATION      Family History  Problem Relation Age of Onset  . Hypertension Father   . Dementia Father        died age 52 in 07-17-2017  . Memory loss Father   . Emphysema Mother   . COPD Mother   . Thyroid disease Mother   . Cancer Neg Hx   . Diabetes Neg Hx   . Heart disease Neg Hx   . Stroke Neg Hx     SOCIAL HX:  Caffienated  drinks-no Seat belt use often-yes Regular Exercise-no Smoke alarm in the home-yes Firearms/guns in the home-yes History of physical abuse-no  Married 2 kids daughter and son  Oldest of siblings has 2 brothers and 1 sister  Never smoker  Works Physicist, medical and [employer] wants to retire at 58 y.o. works in Education administrator   DPR wife Vickie     Current Outpatient Medications:  .  Alprostadil, Vasodilator, (EDEX IC), 1 Dose by Intracavernosal route as needed (for erectile dysfunstion). Pt is unsure of the dose., Disp: , Rfl:  .  aspirin EC 81 MG tablet, Take 81 mg by mouth daily., Disp: , Rfl:  .  atorvastatin (LIPITOR) 80 MG tablet, Take 1 tablet (80 mg total) by mouth daily at 6 PM., Disp: 90 tablet, Rfl: 3 .  clopidogrel (PLAVIX) 75 MG tablet, Take 75 mg by mouth daily., Disp: , Rfl:  .  glimepiride (AMARYL) 2 MG tablet, TAKE 1 TABLET (2 MG TOTAL) BY MOUTH DAILY WITH BREAKFAST., Disp: 90 tablet, Rfl: 1 .  lisinopril (ZESTRIL) 20 MG tablet, Take 1 tablet (20 mg total) by mouth daily., Disp: 90 tablet, Rfl: 3 .  metFORMIN (GLUCOPHAGE) 850 MG tablet, Take 1 tablet (850 mg total) by mouth daily  with breakfast., Disp: 90 tablet, Rfl: 1 .  metoprolol succinate (TOPROL-XL) 50 MG 24 hr tablet, Take 1 tablet (50 mg total) by mouth daily. Take with or immediately following a meal., Disp: 90 tablet, Rfl: 3 .  nitroGLYCERIN (NITROSTAT) 0.4 MG SL tablet, Take 1 tablet under tongue as directed as needed [Take 1 tablet q5 minutes with max dose of 3 doses. If 3rd dose is needed, call 911], Disp: , Rfl:  .  sildenafil (VIAGRA) 100 MG tablet, Take 0.5-1 tablets (50-100 mg total) by mouth daily as needed for erectile dysfunction., Disp: 5 tablet, Rfl: 11 .  sitaGLIPtin (JANUVIA) 100 MG tablet, Take 1 tablet (100 mg total) by mouth daily., Disp: 90 tablet, Rfl: 1 .  testosterone cypionate (DEPOTESTOSTERONE CYPIONATE) 200 MG/ML injection, Inject 200 mg into the muscle every 7 (seven)  days. Pt uses on Saturday., Disp: , Rfl:  .  valACYclovir (VALTREX) 1000 MG tablet, Take two pills (2,000 mg) by mouth at onset of oral fever blister, then two more pills twelve hours later x 1 day, Disp: 30 tablet, Rfl: 5  Current Facility-Administered Medications:  .  testosterone cypionate (DEPOTESTOTERONE CYPIONATE) injection 200 mg, 200 mg, Intramuscular, Q14 Days, Janith Lima, MD, 200 mg at 07/29/12 1635  EXAM:  VITALS per patient if applicable:  GENERAL: alert, oriented, appears well and in no acute distress  HEENT: atraumatic, conjunttiva clear, no obvious abnormalities on inspection of external nose and ears  NECK: normal movements of the head and neck  LUNGS: on inspection no signs of respiratory distress, breathing rate appears normal, no obvious gross SOB, gasping or wheezing  CV: no obvious cyanosis  MS: moves all visible extremities without noticeable abnormality  PSYCH/NEURO: pleasant and cooperative, no obvious depression or anxiety, speech and thought processing grossly intact  ASSESSMENT AND PLAN:  Discussed the following assessment and plan:  Annual physical exam Flu shot at work 12/2018  tdap had 06/25/15 Disc shingrix today to consider in the future wants to wait until covid 19 vaccine comes out and he gest this  pna 23 had 04/05/14 due 04/2019    PSA, ED, low T with Dr. Tonette Bihari [facility] saw w/in the last few months 2020 get records  09/15/18 [facility] appt low T PSA 09/15/18 1.28, T was 1003.6  -on testosterone Q1-2 weeks per urology   Colonoscopy 02/04/15 Dr. Melina Copa diverticulosis f/u in 5 years  due 02/2020   Will check hep C   Dermatology Dr. Nehemiah Massed due to f/u in 10 or 02/2019 pt will call for appt as of 02/24/2019  Never smoker  Former etoh abuse in 34s but quit in 50s   rec healthy diet and exercise  Essential hypertension - Plan: lisinopril (ZESTRIL) 20 MG tablet, Comprehensive metabolic panel, CBC with  Differential/Platelet, Lipid panel Increase lis 10 to 20 mg  Cont BB same dose Consider add hctz 12.5 mg in future if not at goal <130/<80    Controlled type 2 diabetes mellitus with complication, without long-term current use of insulin ([place]) - Plan: HgB A1c, Microalbumin / creatinine urine ratio ROI eye exam   Mixed hyperlipidemia -cont statin   Hypothyroidism, unspecified type - Plan: TSH was on levo/tirosint 25 mcg to 50 cmg in 2013 now not on any medication   Vitamin D deficiency - Plan: Vitamin D (25 hydroxy)  B12 deficiency - Plan: B12  Stress/anxiety  Declines medication to tx  Disc deep breathing techniques  Eye [place] [facility] saw 11/2018 ROI sent today 02/24/2019  ENT Dr. Pryor Ochoa  Cards-Dr. Clayborn Bigness  Prior PCP Dr. Manuella Ghazi, Dr. Sanda Klein  -we discussed possible serious and likely etiologies, options for evaluation and workup, limitations of telemedicine visit vs in person visit, treatment, treatment risks and precautions. Pt prefers to treat via telemedicine empirically rather then risking or undertaking an in person visit at this moment. Patient agrees to seek prompt in person care if worsening, new symptoms arise, or if is not improving with treatment.   I discussed the assessment and treatment plan with the patient. The patient was provided an opportunity to ask questions and all were answered. The patient agreed with the plan and demonstrated an understanding of the instructions.   The patient was advised to call back or seek an in-person evaluation if the symptoms worsen or if the condition fails to improve as anticipated.  Time spent 20 minutes  Delorise Jackson, MD

## 2019-02-24 NOTE — Patient Instructions (Addendum)
Goal blood pressure <130/<80 call the office before your next appointment if you are not at goal with the increase in medication   Preventing Unhealthy Weight Gain, Adult Staying at a healthy weight is important to your overall health. When fat builds up in your body, you may become overweight or obese. Being overweight or obese increases your risk of developing certain health problems, such as heart disease, diabetes, sleeping problems, joint problems, and some types of cancer. Unhealthy weight gain is often the result of making unhealthy food choices or not getting enough exercise. You can make changes to your lifestyle to prevent obesity and stay as healthy as possible. What nutrition changes can be made?   Eat only as much as your body needs. To do this: ? Pay attention to signs that you are hungry or full. Stop eating as soon as you feel full. ? If you feel hungry, try drinking water first before eating. Drink enough water so your urine is clear or pale yellow. ? Eat smaller portions. Pay attention to portion sizes when eating out. ? Look at serving sizes on food labels. Most foods contain more than one serving per container. ? Eat the recommended number of calories for your gender and activity level. For most active people, a daily total of 2,000 calories is appropriate. If you are trying to lose weight or are not very active, you may need to eat fewer calories. Talk with your health care provider or a diet and nutrition specialist (dietitian) about how many calories you need each day.  Choose healthy foods, such as: ? Fruits and vegetables. At each meal, try to fill at least half of your plate with fruits and vegetables. ? Whole grains, such as whole-wheat bread, brown rice, and quinoa. ? Lean meats, such as chicken or fish. ? Other healthy proteins, such as beans, eggs, or tofu. ? Healthy fats, such as nuts, seeds, fatty fish, and olive oil. ? Low-fat or fat-free dairy products.  Check  food labels, and avoid food and drinks that: ? Are high in calories. ? Have added sugar. ? Are high in sodium. ? Have saturated fats or trans fats.  Cook foods in healthier ways, such as by baking, broiling, or grilling.  Make a meal plan for the week, and shop with a grocery list to help you stay on track with your purchases. Try to avoid going to the grocery store when you are hungry.  When grocery shopping, try to shop around the outside of the store first, where the fresh foods are. Doing this helps you to avoid prepackaged foods, which can be high in sugar, salt (sodium), and fat. What lifestyle changes can be made?   Exercise for 30 or more minutes on 5 or more days each week. Exercising may include brisk walking, yard work, biking, running, swimming, and team sports like basketball and soccer. Ask your health care provider which exercises are safe for you.  Do muscle-strengthening activities, such as lifting weights or using resistance bands, on 2 or more days a week.  Do not use any products that contain nicotine or tobacco, such as cigarettes and e-cigarettes. If you need help quitting, ask your health care provider.  Limit alcohol intake to no more than 1 drink a day for nonpregnant women and 2 drinks a day for men. One drink equals 12 oz of beer, 5 oz of wine, or 1 oz of hard liquor.  Try to get 7-9 hours of sleep each night. What  other changes can be made?  Keep a food and activity journal to keep track of: ? What you ate and how many calories you had. Remember to count the calories in sauces, dressings, and side dishes. ? Whether you were active, and what exercises you did. ? Your calorie, weight, and activity goals.  Check your weight regularly. Track any changes. If you notice you have gained weight, make changes to your diet or activity routine.  Avoid taking weight-loss medicines or supplements. Talk to your health care provider before starting any new medicine or  supplement.  Talk to your health care provider before trying any new diet or exercise plan. Why are these changes important? Eating healthy, staying active, and having healthy habits can help you to prevent obesity. Those changes also:  Help you manage stress and emotions.  Help you connect with friends and family.  Improve your self-esteem.  Improve your sleep.  Prevent long-term health problems. What can happen if changes are not made? Being obese or overweight can cause you to develop joint or bone problems, which can make it hard for you to stay active or do activities you enjoy. Being obese or overweight also puts stress on your heart and lungs and can lead to health problems like diabetes, heart disease, and some cancers. Where to find more information Talk with your health care provider or a dietitian about healthy eating and healthy lifestyle choices. You may also find information from:  [place] Department of Agriculture, MyPlate: FormerBoss.no  American Heart Association: www.heart.org  Centers for Disease Control and Prevention: http://www.wolf.info/ Summary  Staying at a healthy weight is important to your overall health. It helps you to prevent certain diseases and health problems, such as heart disease, diabetes, joint problems, sleep disorders, and some types of cancer.  Being obese or overweight can cause you to develop joint or bone problems, which can make it hard for you to stay active or do activities you enjoy.  You can prevent unhealthy weight gain by eating a healthy diet, exercising regularly, not smoking, limiting alcohol, and getting enough sleep.  Talk with your health care provider or a dietitian for guidance about healthy eating and healthy lifestyle choices. This information is not intended to replace advice given to you by your health care provider. Make sure you discuss any questions you have with your health care provider. Document Released: 03/20/2016  Document Revised: 03/22/2017 Document Reviewed: 04/25/2016 Elsevier Patient Education  2020 [employer].  Budget-Friendly Healthy Eating There are many ways to save money at the grocery store and continue to eat healthy. You can be successful if you:  Plan meals according to your budget.  Make a grocery list and only purchase food according to your grocery list.  Prepare food yourself. What are tips for following this plan?  Reading food labels  Compare food labels between brand name foods and the store brand. Often the nutritional value is the same, but the store brand is lower cost.  Look for products that do not have added sugar, fat, or salt (sodium). These often cost the same but are healthier for you. Products may be labeled as: ? Sugar-free. ? Nonfat. ? Low-fat. ? Sodium-free. ? Low-sodium.  Look for lean ground beef labeled as at least 92% lean and 8% fat. Shopping  Buy only the items on your grocery list and go only to the areas of the store that have the items on your list.  Use coupons only for foods and brands  you normally buy. Avoid buying items you wouldn't normally buy simply because they are on sale.  Check online and in newspapers for weekly deals.  Buy healthy items from the bulk bins when available, such as herbs, spices, flour, pasta, nuts, and dried fruit.  Buy fruits and vegetables that are in season. Prices are usually lower on in-season produce.  Look at the unit price on the price tag. Use it to compare different brands and sizes to find out which item is the best deal.  Choose healthy items that are often low-cost, such as carrots, potatoes, apples, bananas, and oranges. Dried or canned beans are a low-cost protein source.  Buy in bulk and freeze extra food. Items you can buy in bulk include meats, fish, poultry, frozen fruits, and frozen vegetables.  Avoid buying "ready-to-eat" foods, such as pre-cut fruits and vegetables and pre-made  salads.  If possible, shop around to discover where you can find the best prices. Consider other retailers such as dollar stores, larger [employer], local fruit and vegetable stands, and farmers markets.  Do not shop when you are hungry. If you shop while hungry, it may be hard to stick to your list and budget.  Resist impulse buying. Use your grocery list as your official plan for the week.  Buy a variety of vegetables and fruits by purchasing fresh, frozen, and canned items.  Look at the top and bottom shelves for deals. Foods at eye level (eye level of an adult or child) are usually more expensive.  Be efficient with your time when shopping. The more time you spend at the store, the more money you are likely to spend.  To save money when choosing more expensive foods like meats and dairy: ? Choose cheaper cuts of meat, such as bone-in chicken thighs and drumsticks instead of skinless and boneless chicken. When you are ready to prepare the chicken, you can remove the skin yourself to make it healthier. ? Choose lean meats like chicken or [place] instead of beef. ? Choose canned seafood, such as tuna, salmon, or sardines. ? Buy eggs as a low-cost source of protein. ? Buy dried beans and peas, such as lentils, split peas, or kidney beans instead of meats. Dried beans and peas are a good alternative source of protein. ? Buy the larger tubs of yogurt instead of individual-sized containers.  Choose water instead of sodas and other sweetened beverages.  Avoid buying chips, cookies, and other "junk food." These items are usually expensive and not healthy. Cooking  Make extra food and freeze the extras in meal-sized containers or in individual portions for fast meals and snacks.  Pre-cook on days when you have extra time to prepare meals in advance. You can keep these meals in the fridge or freezer and reheat for a quick meal.  When you come home from the grocery store, wash, peel, and  cut fruits and vegetables so they are ready to use and eat. This will help reduce food waste. Meal planning  Do not eat out or get fast food. Prepare food at home.  Make a grocery list and make sure to bring it with you to the store. If you have a smart phone, you could use your phone to create your shopping list.  Plan meals and snacks according to a grocery list and budget you create.  Use leftovers in your meal plan for the week.  Look for recipes where you can cook once and make enough food for two  meals.  Include budget-friendly meals like stews, casseroles, and stir-fry dishes.  Try some meatless meals or try "no cook" meals like salads.  Make sure that half your plate is filled with fruits or vegetables. Choose from fresh, frozen, or canned fruits and vegetables. If eating canned, remember to rinse them before eating. This will remove any excess salt added for packaging. Summary  Eating healthy on a budget is possible if you plan your meals according to your budget, purchase according to your budget and grocery list, and prepare food yourself.  Tips for buying more food on a limited budget include buying generic brands, using coupons only for foods you normally buy, and buying healthy items from the bulk bins when available.  Tips for buying cheaper food to replace expensive food include choosing cheaper, lean cuts of meat, and buying dried beans and peas. This information is not intended to replace advice given to you by your health care provider. Make sure you discuss any questions you have with your health care provider. Document Released: 11/20/2013 Document Revised: 03/20/2017 Document Reviewed: 03/20/2017 Elsevier Patient Education  2020 [place] Following a healthy eating pattern may help you to achieve and maintain a healthy body weight, reduce the risk of chronic disease, and live a long and productive life. It is important to follow a healthy  eating pattern at an appropriate calorie level for your body. Your nutritional needs should be met primarily through food by choosing a variety of nutrient-rich foods. What are tips for following this plan? Reading food labels  Read labels and choose the following: ? Reduced or low sodium. ? Juices with 100% fruit juice. ? Foods with low saturated fats and high polyunsaturated and monounsaturated fats. ? Foods with whole grains, such as whole wheat, cracked wheat, brown rice, and wild rice. ? Whole grains that are fortified with folic acid. This is recommended for women who are pregnant or who want to become pregnant.  Read labels and avoid the following: ? Foods with a lot of added sugars. These include foods that contain brown sugar, corn sweetener, corn syrup, dextrose, fructose, glucose, high-fructose corn syrup, honey, invert sugar, lactose, malt syrup, maltose, molasses, raw sugar, sucrose, trehalose, or turbinado sugar.  Do not eat more than the following amounts of added sugar per day:  6 teaspoons (25 g) for women.  9 teaspoons (38 g) for men. ? Foods that contain processed or refined starches and grains. ? Refined grain products, such as white flour, degermed cornmeal, white bread, and white rice. Shopping  Choose nutrient-rich snacks, such as vegetables, whole fruits, and nuts. Avoid high-calorie and high-sugar snacks, such as potato chips, fruit snacks, and candy.  Use oil-based dressings and spreads on foods instead of solid fats such as butter, stick margarine, or cream cheese.  Limit pre-made sauces, mixes, and "instant" products such as flavored rice, instant noodles, and ready-made pasta.  Try more plant-protein sources, such as tofu, tempeh, black beans, edamame, lentils, nuts, and seeds.  Explore eating plans such as the Mediterranean diet or vegetarian diet. Cooking  Use oil to saut or stir-fry foods instead of solid fats such as butter, stick margarine, or  lard.  Try baking, boiling, grilling, or broiling instead of frying.  Remove the fatty part of meats before cooking.  Steam vegetables in water or broth. Meal planning   At meals, imagine dividing your plate into fourths: ? One-half of your plate is fruits and vegetables. ? One-fourth of your  plate is whole grains. ? One-fourth of your plate is protein, especially lean meats, poultry, eggs, tofu, beans, or nuts.  Include low-fat dairy as part of your daily diet. Lifestyle  Choose healthy options in all settings, including home, work, school, restaurants, or stores.  Prepare your food safely: ? Wash your hands after handling raw meats. ? Keep food preparation surfaces clean by regularly washing with hot, soapy water. ? Keep raw meats separate from ready-to-eat foods, such as fruits and vegetables. ? Cook seafood, meat, poultry, and eggs to the recommended internal temperature. ? Store foods at safe temperatures. In general:  Keep cold foods at 25F (4.4C) or below.  Keep hot foods at 125F (60C) or above.  Keep your freezer at [facility] (-17.8C) or below.  Foods are no longer safe to eat when they have been between the temperatures of 40-125F (4.4-60C) for more than 2 hours. What foods should I eat? Fruits Aim to eat 2 cup-equivalents of fresh, canned (in natural juice), or frozen fruits each day. Examples of 1 cup-equivalent of fruit include 1 small apple, 8 large strawberries, 1 cup canned fruit,  cup dried fruit, or 1 cup 100% juice. Vegetables Aim to eat 2-3 cup-equivalents of fresh and frozen vegetables each day, including different varieties and colors. Examples of 1 cup-equivalent of vegetables include 2 medium carrots, 2 cups raw, leafy greens, 1 cup chopped vegetable (raw or cooked), or 1 medium baked potato. Grains Aim to eat 6 ounce-equivalents of whole grains each day. Examples of 1 ounce-equivalent of grains include 1 slice of bread, 1 cup ready-to-eat cereal, 3  cups popcorn, or  cup cooked rice, pasta, or cereal. Meats and other proteins Aim to eat 5-6 ounce-equivalents of protein each day. Examples of 1 ounce-equivalent of protein include 1 egg, 1/2 cup nuts or seeds, or 1 tablespoon (16 g) peanut butter. A cut of meat or fish that is the size of a deck of cards is about 3-4 ounce-equivalents.  Of the protein you eat each week, try to have at least 8 ounces come from seafood. This includes salmon, trout, herring, and anchovies. Dairy Aim to eat 3 cup-equivalents of fat-free or low-fat dairy each day. Examples of 1 cup-equivalent of dairy include 1 cup (240 mL) milk, 8 ounces (250 g) yogurt, 1 ounces (44 g) natural cheese, or 1 cup (240 mL) fortified soy milk. Fats and oils  Aim for about 5 teaspoons (21 g) per day. Choose monounsaturated fats, such as canola and olive oils, avocados, peanut butter, and most nuts, or polyunsaturated fats, such as sunflower, corn, and soybean oils, walnuts, pine nuts, sesame seeds, sunflower seeds, and flaxseed. Beverages  Aim for six 8-oz glasses of water per day. Limit coffee to three to five 8-oz cups per day.  Limit caffeinated beverages that have added calories, such as soda and energy drinks.  Limit alcohol intake to no more than 1 drink a day for nonpregnant women and 2 drinks a day for men. One drink equals 12 oz of beer (355 mL), 5 oz of wine (148 mL), or 1 oz of hard liquor (44 mL). Seasoning and other foods  Avoid adding excess amounts of salt to your foods. Try flavoring foods with herbs and spices instead of salt.  Avoid adding sugar to foods.  Try using oil-based dressings, sauces, and spreads instead of solid fats. This information is based on general [place] nutrition guidelines. For more information, visit BuildDNA.es. Exact amounts may vary based on your nutrition needs. Summary  A healthy eating plan may help you to maintain a healthy weight, reduce the risk of chronic diseases, and stay  active throughout your life.  Plan your meals. Make sure you eat the right portions of a variety of nutrient-rich foods.  Try baking, boiling, grilling, or broiling instead of frying.  Choose healthy options in all settings, including home, work, school, restaurants, or stores. This information is not intended to replace advice given to you by your health care provider. Make sure you discuss any questions you have with your health care provider. Document Released: 07/01/2017 Document Revised: 07/01/2017 Document Reviewed: 07/01/2017 Elsevier Patient Education  [place] DASH stands for "Dietary Approaches to Stop Hypertension." The DASH eating plan is a healthy eating plan that has been shown to reduce high blood pressure (hypertension). It may also reduce your risk for type 2 diabetes, heart disease, and stroke. The DASH eating plan may also help with weight loss. What are tips for following this plan?  General guidelines  Avoid eating more than 2,300 mg (milligrams) of salt (sodium) a day. If you have hypertension, you may need to reduce your sodium intake to 1,500 mg a day.  Limit alcohol intake to no more than 1 drink a day for nonpregnant women and 2 drinks a day for men. One drink equals 12 oz of beer, 5 oz of wine, or 1 oz of hard liquor.  Work with your health care provider to maintain a healthy body weight or to lose weight. Ask what an ideal weight is for you.  Get at least 30 minutes of exercise that causes your heart to beat faster (aerobic exercise) most days of the week. Activities may include walking, swimming, or biking.  Work with your health care provider or diet and nutrition specialist (dietitian) to adjust your eating plan to your individual calorie needs. Reading food labels   Check food labels for the amount of sodium per serving. Choose foods with less than 5 percent of the Daily Value of sodium. Generally, foods with less than 300 mg  of sodium per serving fit into this eating plan.  To find whole grains, look for the word "whole" as the first word in the ingredient list. Shopping  Buy products labeled as "low-sodium" or "no salt added."  Buy fresh foods. Avoid canned foods and premade or frozen meals. Cooking  Avoid adding salt when cooking. Use salt-free seasonings or herbs instead of table salt or sea salt. Check with your health care provider or pharmacist before using salt substitutes.  Do not fry foods. Cook foods using healthy methods such as baking, boiling, grilling, and broiling instead.  Cook with heart-healthy oils, such as olive, canola, soybean, or sunflower oil. Meal planning  Eat a balanced diet that includes: ? 5 or more servings of fruits and vegetables each day. At each meal, try to fill half of your plate with fruits and vegetables. ? Up to 6-8 servings of whole grains each day. ? Less than 6 oz of lean meat, poultry, or fish each day. A 3-oz serving of meat is about the same size as a deck of cards. One egg equals 1 oz. ? 2 servings of low-fat dairy each day. ? A serving of nuts, seeds, or beans 5 times each week. ? Heart-healthy fats. Healthy fats called Omega-3 fatty acids are found in foods such as flaxseeds and coldwater fish, like sardines, salmon, and mackerel.  Limit how much you eat of the following: ?  Canned or prepackaged foods. ? Food that is high in trans fat, such as fried foods. ? Food that is high in saturated fat, such as fatty meat. ? Sweets, desserts, sugary drinks, and other foods with added sugar. ? Full-fat dairy products.  Do not salt foods before eating.  Try to eat at least 2 vegetarian meals each week.  Eat more home-cooked food and less restaurant, buffet, and fast food.  When eating at a restaurant, ask that your food be prepared with less salt or no salt, if possible. What foods are recommended? The items listed may not be a complete list. Talk with your  dietitian about what dietary choices are best for you. Grains Whole-grain or whole-wheat bread. Whole-grain or whole-wheat pasta. Brown rice. [place]. Bulgur. Whole-grain and low-sodium cereals. Pita bread. Low-fat, low-sodium crackers. Whole-wheat flour tortillas. Vegetables Fresh or frozen vegetables (raw, steamed, roasted, or grilled). Low-sodium or reduced-sodium tomato and vegetable juice. Low-sodium or reduced-sodium tomato sauce and tomato paste. Low-sodium or reduced-sodium canned vegetables. Fruits All fresh, dried, or frozen fruit. Canned fruit in natural juice (without added sugar). Meat and other protein foods Skinless chicken or [place]. Ground chicken or [place]. Pork with fat trimmed off. Fish and seafood. Egg whites. Dried beans, peas, or lentils. Unsalted nuts, nut butters, and seeds. Unsalted canned beans. Lean cuts of beef with fat trimmed off. Low-sodium, lean deli meat. Dairy Low-fat (1%) or fat-free (skim) milk. Fat-free, low-fat, or reduced-fat cheeses. Nonfat, low-sodium ricotta or cottage cheese. Low-fat or nonfat yogurt. Low-fat, low-sodium cheese. Fats and oils Soft margarine without trans fats. Vegetable oil. Low-fat, reduced-fat, or light mayonnaise and salad dressings (reduced-sodium). Canola, safflower, olive, soybean, and sunflower oils. Avocado. Seasoning and other foods Herbs. Spices. Seasoning mixes without salt. Unsalted popcorn and pretzels. Fat-free sweets. What foods are not recommended? The items listed may not be a complete list. Talk with your dietitian about what dietary choices are best for you. Grains Baked goods made with fat, such as croissants, muffins, or some breads. Dry pasta or rice meal packs. Vegetables Creamed or fried vegetables. Vegetables in a cheese sauce. Regular canned vegetables (not low-sodium or reduced-sodium). Regular canned tomato sauce and paste (not low-sodium or reduced-sodium). Regular tomato and vegetable juice (not  low-sodium or reduced-sodium). Angie Fava. Olives. Fruits Canned fruit in a light or heavy syrup. Fried fruit. Fruit in cream or butter sauce. Meat and other protein foods Fatty cuts of meat. Ribs. Fried meat. Berniece Salines. Sausage. Bologna and other processed lunch meats. Salami. Fatback. Hotdogs. Bratwurst. Salted nuts and seeds. Canned beans with added salt. Canned or smoked fish. Whole eggs or egg yolks. Chicken or [place] with skin. Dairy Whole or 2% milk, cream, and half-and-half. Whole or full-fat cream cheese. Whole-fat or sweetened yogurt. Full-fat cheese. Nondairy creamers. Whipped toppings. Processed cheese and cheese spreads. Fats and oils Butter. Stick margarine. Lard. Shortening. Ghee. Bacon fat. Tropical oils, such as coconut, palm kernel, or palm oil. Seasoning and other foods Salted popcorn and pretzels. Onion salt, garlic salt, seasoned salt, table salt, and sea salt. Worcestershire sauce. Tartar sauce. Barbecue sauce. Teriyaki sauce. Soy sauce, including reduced-sodium. Steak sauce. Canned and packaged gravies. Fish sauce. Oyster sauce. Cocktail sauce. Horseradish that you find on the shelf. Ketchup. Mustard. Meat flavorings and tenderizers. Bouillon cubes. Hot sauce and Tabasco sauce. Premade or packaged marinades. Premade or packaged taco seasonings. Relishes. Regular salad dressings. Where to find more information:  National Heart, Lung, and [place]: https://wilson-eaton.com/  American Heart Association: www.heart.org Summary  The DASH eating plan is a healthy eating plan that has been shown to reduce high blood pressure (hypertension). It may also reduce your risk for type 2 diabetes, heart disease, and stroke.  With the DASH eating plan, you should limit salt (sodium) intake to 2,300 mg a day. If you have hypertension, you may need to reduce your sodium intake to 1,500 mg a day.  When on the DASH eating plan, aim to eat more fresh fruits and vegetables, whole grains, lean proteins,  low-fat dairy, and heart-healthy fats.  Work with your health care provider or diet and nutrition specialist (dietitian) to adjust your eating plan to your individual calorie needs. This information is not intended to replace advice given to you by your health care provider. Make sure you discuss any questions you have with your health care provider. Document Released: 03/08/2011 Document Revised: 03/01/2017 Document Reviewed: 03/12/2016 Elsevier Patient Education  [place].  Low-Sodium Eating Plan Sodium, which is an element that makes up salt, helps you maintain a healthy balance of fluids in your body. Too much sodium can increase your blood pressure and cause fluid and waste to be held in your body. Your health care provider or dietitian may recommend following this plan if you have high blood pressure (hypertension), kidney disease, liver disease, or heart failure. Eating less sodium can help lower your blood pressure, reduce swelling, and protect your heart, liver, and kidneys. What are tips for following this plan? General guidelines  Most people on this plan should limit their sodium intake to 1,500-2,000 mg (milligrams) of sodium each day. Reading food labels   The Nutrition Facts label lists the amount of sodium in one serving of the food. If you eat more than one serving, you must multiply the listed amount of sodium by the number of servings.  Choose foods with less than 140 mg of sodium per serving.  Avoid foods with 300 mg of sodium or more per serving. Shopping  Look for lower-sodium products, often labeled as "low-sodium" or "no salt added."  Always check the sodium content even if foods are labeled as "unsalted" or "no salt added".  Buy fresh foods. ? Avoid canned foods and premade or frozen meals. ? Avoid canned, cured, or processed meats  Buy breads that have less than 80 mg of sodium per slice. Cooking  Eat more home-cooked food and less restaurant,  buffet, and fast food.  Avoid adding salt when cooking. Use salt-free seasonings or herbs instead of table salt or sea salt. Check with your health care provider or pharmacist before using salt substitutes.  Cook with plant-based oils, such as canola, sunflower, or olive oil. Meal planning  When eating at a restaurant, ask that your food be prepared with less salt or no salt, if possible.  Avoid foods that contain MSG (monosodium glutamate). MSG is sometimes added to [place] food, bouillon, and some canned foods. What foods are recommended? The items listed may not be a complete list. Talk with your dietitian about what dietary choices are best for you. Grains Low-sodium cereals, including oats, puffed wheat and rice, and shredded wheat. Low-sodium crackers. Unsalted rice. Unsalted pasta. Low-sodium bread. Whole-grain breads and whole-grain pasta. Vegetables Fresh or frozen vegetables. "No salt added" canned vegetables. "No salt added" tomato sauce and paste. Low-sodium or reduced-sodium tomato and vegetable juice. Fruits Fresh, frozen, or canned fruit. Fruit juice. Meats and other protein foods Fresh or frozen (no salt added) meat, poultry, seafood, and fish. Low-sodium canned  tuna and salmon. Unsalted nuts. Dried peas, beans, and lentils without added salt. Unsalted canned beans. Eggs. Unsalted nut butters. Dairy Milk. Soy milk. Cheese that is naturally low in sodium, such as ricotta cheese, fresh mozzarella, or Swiss cheese Low-sodium or reduced-sodium cheese. Cream cheese. Yogurt. Fats and oils Unsalted butter. Unsalted margarine with no trans fat. Vegetable oils such as canola or olive oils. Seasonings and other foods Fresh and dried herbs and spices. Salt-free seasonings. Low-sodium mustard and ketchup. Sodium-free salad dressing. Sodium-free light mayonnaise. Fresh or refrigerated horseradish. Lemon juice. Vinegar. Homemade, reduced-sodium, or low-sodium soups. Unsalted popcorn and  pretzels. Low-salt or salt-free chips. What foods are not recommended? The items listed may not be a complete list. Talk with your dietitian about what dietary choices are best for you. Grains Instant hot cereals. Bread stuffing, pancake, and biscuit mixes. Croutons. Seasoned rice or pasta mixes. Noodle soup cups. Boxed or frozen macaroni and cheese. Regular salted crackers. Self-rising flour. Vegetables Sauerkraut, pickled vegetables, and relishes. Olives. [place] fries. Onion rings. Regular canned vegetables (not low-sodium or reduced-sodium). Regular canned tomato sauce and paste (not low-sodium or reduced-sodium). Regular tomato and vegetable juice (not low-sodium or reduced-sodium). Frozen vegetables in sauces. Meats and other protein foods Meat or fish that is salted, canned, smoked, spiced, or pickled. Bacon, ham, sausage, hotdogs, corned beef, chipped beef, packaged lunch meats, salt pork, jerky, pickled herring, anchovies, regular canned tuna, sardines, salted nuts. Dairy Processed cheese and cheese spreads. Cheese curds. Blue cheese. Feta cheese. String cheese. Regular cottage cheese. Buttermilk. Canned milk. Fats and oils Salted butter. Regular margarine. Ghee. Bacon fat. Seasonings and other foods Onion salt, garlic salt, seasoned salt, table salt, and sea salt. Canned and packaged gravies. Worcestershire sauce. Tartar sauce. Barbecue sauce. Teriyaki sauce. Soy sauce, including reduced-sodium. Steak sauce. Fish sauce. Oyster sauce. Cocktail sauce. Horseradish that you find on the shelf. Regular ketchup and mustard. Meat flavorings and tenderizers. Bouillon cubes. Hot sauce and Tabasco sauce. Premade or packaged marinades. Premade or packaged taco seasonings. Relishes. Regular salad dressings. Salsa. Potato and tortilla chips. Corn chips and puffs. Salted popcorn and pretzels. Canned or dried soups. Pizza. Frozen entrees and pot pies. Summary  Eating less sodium can help lower your blood  pressure, reduce swelling, and protect your heart, liver, and kidneys.  Most people on this plan should limit their sodium intake to 1,500-2,000 mg (milligrams) of sodium each day.  Canned, boxed, and frozen foods are high in sodium. Restaurant foods, fast foods, and pizza are also very high in sodium. You also get sodium by adding salt to food.  Try to cook at home, eat more fresh fruits and vegetables, and eat less fast food, canned, processed, or prepared foods. This information is not intended to replace advice given to you by your health care provider. Make sure you discuss any questions you have with your health care provider. Document Released: 09/08/2001 Document Revised: 03/01/2017 Document Reviewed: 03/12/2016 Elsevier Patient Education  2020 [place] is a normal reaction to life events. Stress is what you feel when life demands more than you are used to, or more than you think you can handle. Some stress can be useful, such as studying for a test or meeting a deadline at work. Stress that occurs too often or for too long can cause problems. It can affect your emotional health and interfere with relationships and normal daily activities. Too much stress can weaken your body's defense system (immune system) and increase your risk  for physical illness. If you already have a medical problem, stress can make it worse. What are the causes? All sorts of life events can cause stress. An event that causes stress for one person may not be stressful for another person. Major life events, whether positive or negative, commonly cause stress. Examples include:  Losing a job or starting a new job.  Losing a loved one.  Moving to a new town or home.  Getting married or divorced.  Having a baby.  Injury or illness. Less obvious life events can also cause stress, especially if they occur day after day or in combination with each other. Examples include:  Working long  hours.  Driving in traffic.  Caring for children.  Being in debt.  Being in a difficult relationship. What are the signs or symptoms? Stress can cause emotional symptoms, including:  Anxiety. This is feeling worried, afraid, on edge, overwhelmed, or out of control.  Anger, including irritation or impatience.  Depression. This is feeling sad, down, helpless, or guilty.  Trouble focusing, remembering, or making decisions. Stress can cause physical symptoms, including:  Aches and pains. These may affect your head, neck, back, stomach, or other areas of your body.  Tight muscles or a clenched jaw.  Low energy.  Trouble sleeping. Stress can cause unhealthy behaviors, including:  Eating to feel better (overeating) or skipping meals.  Working too much or putting off tasks.  Smoking, drinking alcohol, or using drugs to feel better. How is this diagnosed? Stress is diagnosed through an assessment by your health care provider. He or she may diagnose this condition based on:  Your symptoms and any stressful life events.  Your medical history.  Tests to rule out other causes of your symptoms. Depending on your condition, your health care provider may refer you to a specialist for further evaluation. How is this treated?  Stress management techniques are the recommended treatment for stress. Medicine is not typically recommended for the treatment of stress. Techniques to reduce your reaction to stressful life events include:  Stress identification. Monitor yourself for symptoms of stress and identify what causes stress for you. These skills may help you to avoid or prepare for stressful events.  Time management. Set your priorities, keep a calendar of events, and learn to say no. Taking these actions can help you avoid making too many commitments. Techniques for coping with stress include:  Rethinking the problem. Try to think realistically about stressful events rather  than ignoring them or overreacting. Try to find the positives in a stressful situation rather than focusing on the negatives.  Exercise. Physical exercise can release both physical and emotional tension. The key is to find a form of exercise that you enjoy and do it regularly.  Relaxation techniques. These relax the body and mind. The key is to find one or more that you enjoy and use the technique(s) regularly. Examples include: ? Meditation, deep breathing, or progressive relaxation techniques. ? Yoga or tai chi. ? Biofeedback, mindfulness techniques, or journaling. ? Listening to music, being out in nature, or participating in other hobbies.  Practicing a healthy lifestyle. Eat a balanced diet, drink plenty of water, limit or avoid caffeine, and get plenty of sleep.  Having a strong support network. Spend time with family, friends, or other people you enjoy being around. Express your feelings and talk things over with someone you trust. Counseling or talk therapy with a mental health professional may be helpful if you are having trouble managing  stress on your own. Follow these instructions at home: Lifestyle   Avoid drugs.  Do not use any products that contain nicotine or tobacco, such as cigarettes and e-cigarettes. If you need help quitting, ask your health care provider.  Limit alcohol intake to no more than 1 drink a day for nonpregnant women and 2 drinks a day for men. One drink equals 12 oz of beer, 5 oz of wine, or 1 oz of hard liquor.  Do not use alcohol or drugs to relax.  Eat a balanced diet that includes fresh fruits and vegetables, whole grains, lean meats, fish, eggs, and beans, and low-fat dairy. Avoid processed foods and foods high in added fat, sugar, and salt.  Exercise at least 30 minutes on 5 or more days each week.  Get 7-8 hours of sleep each night. General instructions   Practice stress management techniques as discussed with your health care  provider.  Drink enough fluid to keep your urine clear or pale yellow.  Take over-the-counter and prescription medicines only as told by your health care provider.  Keep all follow-up visits as told by your health care provider. This is important. Contact a health care provider if:  Your symptoms get worse.  You have new symptoms.  You feel overwhelmed by your problems and can no longer manage them on your own. Get help right away if:  You have thoughts of hurting yourself or others. If you ever feel like you may hurt yourself or others, or have thoughts about taking your own life, get help right away. You can go to your nearest emergency department or call:  Your local emergency services (911 in the [place]  A suicide crisis helpline, such as the [place] at 8593884165. This is open 24 hours a day. Summary  Stress is a normal reaction to life events. It can cause problems if it happens too often or for too long.  Practicing stress management techniques is the best way to treat stress.  Counseling or talk therapy with a mental health professional may be helpful if you are having trouble managing stress on your own. This information is not intended to replace advice given to you by your health care provider. Make sure you discuss any questions you have with your health care provider. Document Released: 09/12/2000 Document Revised: 03/01/2017 Document Reviewed: 05/09/2016 Elsevier Patient Education  2020 [employer].

## 2019-03-31 ENCOUNTER — Other Ambulatory Visit: Payer: Self-pay

## 2019-03-31 ENCOUNTER — Other Ambulatory Visit
Admission: RE | Admit: 2019-03-31 | Discharge: 2019-03-31 | Disposition: A | Discharge status: Home / Self Care | Payer: 59 | Source: Ambulatory Visit | Attending: Internal Medicine | Admitting: Internal Medicine

## 2019-03-31 DIAGNOSIS — E039 Hypothyroidism, unspecified: Secondary | ICD-10-CM | POA: Insufficient documentation

## 2019-03-31 DIAGNOSIS — I10 Essential (primary) hypertension: Secondary | ICD-10-CM | POA: Diagnosis not present

## 2019-03-31 DIAGNOSIS — E538 Deficiency of other specified B group vitamins: Secondary | ICD-10-CM | POA: Insufficient documentation

## 2019-03-31 DIAGNOSIS — E559 Vitamin D deficiency, unspecified: Secondary | ICD-10-CM | POA: Diagnosis not present

## 2019-03-31 DIAGNOSIS — Z13818 Encounter for screening for other digestive system disorders: Secondary | ICD-10-CM | POA: Insufficient documentation

## 2019-03-31 DIAGNOSIS — E118 Type 2 diabetes mellitus with unspecified complications: Secondary | ICD-10-CM | POA: Diagnosis not present

## 2019-03-31 LAB — COMPREHENSIVE METABOLIC PANEL
ALT: 39 U/L (ref 0–44)
AST: 24 U/L (ref 15–41)
Albumin: 4.2 g/dL (ref 3.5–5.0)
Alkaline Phosphatase: 90 U/L (ref 38–126)
Anion gap: 10 (ref 5–15)
BUN: 16 mg/dL (ref 6–20)
CO2: 20 mmol/L — ABNORMAL LOW (ref 22–32)
Calcium: 9.4 mg/dL (ref 8.9–10.3)
Chloride: 102 mmol/L (ref 98–111)
Creatinine, Ser: 1.1 mg/dL (ref 0.61–1.24)
GFR calc Af Amer: >60 mL/min (ref >60)
GFR calc non Af Amer: >60 mL/min (ref >60)
Glucose, Bld: 283 mg/dL — ABNORMAL HIGH (ref 70–99)
Potassium: 4.3 mmol/L (ref 3.5–5.1)
Sodium: 132 mmol/L — ABNORMAL LOW (ref 135–145)
Total Bilirubin: 1.1 mg/dL (ref 0.3–1.2)
Total Protein: 7 g/dL (ref 6.5–8.1)

## 2019-03-31 LAB — LIPID PANEL
Cholesterol: 148 mg/dL (ref 0–200)
HDL: 28 mg/dL — ABNORMAL LOW (ref >40)
LDL Cholesterol: 93 mg/dL (ref 0–99)
Total CHOL/HDL Ratio: 5.3 RATIO
Triglycerides: 136 mg/dL (ref <150)
VLDL: 27 mg/dL (ref 0–40)

## 2019-03-31 LAB — CBC WITH DIFFERENTIAL/PLATELET
Abs Immature Granulocytes: 0.04 10*3/uL (ref 0.00–0.07)
Basophils Absolute: 0 10*3/uL (ref 0.0–0.1)
Basophils Relative: 0 %
Eosinophils Absolute: 0.2 10*3/uL (ref 0.0–0.5)
Eosinophils Relative: 2 %
HCT: 45.3 % (ref 39.0–52.0)
Hemoglobin: 16.2 g/dL (ref 13.0–17.0)
Immature Granulocytes: 1 %
Lymphocytes Relative: 16 %
Lymphs Abs: 1.3 10*3/uL (ref 0.7–4.0)
MCH: 31.2 pg (ref 26.0–34.0)
MCHC: 35.8 g/dL (ref 30.0–36.0)
MCV: 87.3 fL (ref 80.0–100.0)
Monocytes Absolute: 0.6 10*3/uL (ref 0.1–1.0)
Monocytes Relative: 8 %
Neutro Abs: 6.1 10*3/uL (ref 1.7–7.7)
Neutrophils Relative %: 73 %
Platelets: 168 10*3/uL (ref 150–400)
RBC: 5.19 MIL/uL (ref 4.22–5.81)
RDW: 12.2 % (ref 11.5–15.5)
WBC: 8.3 10*3/uL (ref 4.0–10.5)
nRBC: 0 % (ref 0.0–0.2)

## 2019-03-31 LAB — HEPATITIS C ANTIBODY: HCV Ab: NONREACTIVE

## 2019-03-31 LAB — TSH: TSH: 5.328 u[IU]/mL — ABNORMAL HIGH (ref 0.350–4.500)

## 2019-03-31 LAB — HEMOGLOBIN A1C
Hgb A1c MFr Bld: 8.7 % — ABNORMAL HIGH (ref 4.8–5.6)
Mean Plasma Glucose: 202.99 mg/dL

## 2019-03-31 LAB — VITAMIN D 25 HYDROXY (VIT D DEFICIENCY, FRACTURES): Vit D, 25-Hydroxy: 28.08 ng/mL — ABNORMAL LOW (ref 30–100)

## 2019-03-31 LAB — VITAMIN B12: Vitamin B-12: 211 pg/mL (ref 180–914)

## 2019-04-01 ENCOUNTER — Other Ambulatory Visit: Payer: Self-pay | Admitting: Internal Medicine

## 2019-04-01 DIAGNOSIS — R946 Abnormal results of thyroid function studies: Secondary | ICD-10-CM

## 2019-04-01 LAB — MICROALBUMIN / CREATININE URINE RATIO
Creatinine, Urine: 121 mg/dL
Microalb Creat Ratio: 10 mg/g creat (ref 0–29)
Microalb, Ur: 11.7 ug/mL — ABNORMAL HIGH

## 2019-04-15 ENCOUNTER — Other Ambulatory Visit: Payer: Self-pay | Admitting: Internal Medicine

## 2019-04-15 DIAGNOSIS — E118 Type 2 diabetes mellitus with unspecified complications: Secondary | ICD-10-CM

## 2019-04-15 MED ORDER — GLIMEPIRIDE 2 MG PO TABS: 2.0000 mg | ORAL_TABLET | Freq: Every day | ORAL | 3 refills | Status: DC

## 2019-05-13 ENCOUNTER — Other Ambulatory Visit: Payer: Self-pay | Admitting: Family Medicine

## 2019-05-13 DIAGNOSIS — E118 Type 2 diabetes mellitus with unspecified complications: Secondary | ICD-10-CM

## 2019-05-13 NOTE — Telephone Encounter (Signed)
Request for refill- needs PCP changed on Rx.

## 2019-05-18 ENCOUNTER — Other Ambulatory Visit: Payer: Self-pay | Admitting: Internal Medicine

## 2019-05-18 DIAGNOSIS — E118 Type 2 diabetes mellitus with unspecified complications: Secondary | ICD-10-CM

## 2019-05-18 DIAGNOSIS — E1165 Type 2 diabetes mellitus with hyperglycemia: Secondary | ICD-10-CM

## 2019-05-18 MED ORDER — METFORMIN HCL 850 MG PO TABS: 850.0000 mg | ORAL_TABLET | Freq: Every day | ORAL | 3 refills | Status: DC

## 2019-07-22 ENCOUNTER — Other Ambulatory Visit: Payer: Self-pay | Admitting: Internal Medicine

## 2019-07-22 DIAGNOSIS — E118 Type 2 diabetes mellitus with unspecified complications: Secondary | ICD-10-CM

## 2019-07-22 MED ORDER — SITAGLIPTIN PHOSPHATE 100 MG PO TABS: 100.0000 mg | ORAL_TABLET | Freq: Every day | ORAL | 3 refills | Status: DC

## 2019-07-31 ENCOUNTER — Emergency Department (HOSPITAL_COMMUNITY): Payer: 59

## 2019-07-31 ENCOUNTER — Inpatient Hospital Stay (HOSPITAL_COMMUNITY)
Admission: EM | Admit: 2019-07-31 | Discharge: 2019-08-03 | DRG: 041 | Disposition: A | Discharge status: Home / Self Care | Payer: 59 | Attending: Neurology | Admitting: Neurology

## 2019-07-31 DIAGNOSIS — I081 Rheumatic disorders of both mitral and tricuspid valves: Secondary | ICD-10-CM | POA: Diagnosis not present

## 2019-07-31 DIAGNOSIS — R531 Weakness: Secondary | ICD-10-CM | POA: Diagnosis not present

## 2019-07-31 DIAGNOSIS — I129 Hypertensive chronic kidney disease with stage 1 through stage 4 chronic kidney disease, or unspecified chronic kidney disease: Secondary | ICD-10-CM | POA: Diagnosis present

## 2019-07-31 DIAGNOSIS — E039 Hypothyroidism, unspecified: Secondary | ICD-10-CM | POA: Diagnosis not present

## 2019-07-31 DIAGNOSIS — E1169 Type 2 diabetes mellitus with other specified complication: Secondary | ICD-10-CM | POA: Diagnosis present

## 2019-07-31 DIAGNOSIS — R52 Pain, unspecified: Secondary | ICD-10-CM | POA: Diagnosis not present

## 2019-07-31 DIAGNOSIS — Z79899 Other long term (current) drug therapy: Secondary | ICD-10-CM

## 2019-07-31 DIAGNOSIS — I2510 Atherosclerotic heart disease of native coronary artery without angina pectoris: Secondary | ICD-10-CM | POA: Diagnosis present

## 2019-07-31 DIAGNOSIS — Z825 Family history of asthma and other chronic lower respiratory diseases: Secondary | ICD-10-CM

## 2019-07-31 DIAGNOSIS — N1831 Chronic kidney disease, stage 3a: Secondary | ICD-10-CM | POA: Diagnosis not present

## 2019-07-31 DIAGNOSIS — H5509 Other forms of nystagmus: Secondary | ICD-10-CM | POA: Diagnosis present

## 2019-07-31 DIAGNOSIS — Z8349 Family history of other endocrine, nutritional and metabolic diseases: Secondary | ICD-10-CM | POA: Diagnosis not present

## 2019-07-31 DIAGNOSIS — E291 Testicular hypofunction: Secondary | ICD-10-CM | POA: Diagnosis present

## 2019-07-31 DIAGNOSIS — Z8249 Family history of ischemic heart disease and other diseases of the circulatory system: Secondary | ICD-10-CM

## 2019-07-31 DIAGNOSIS — N529 Male erectile dysfunction, unspecified: Secondary | ICD-10-CM | POA: Diagnosis present

## 2019-07-31 DIAGNOSIS — I619 Nontraumatic intracerebral hemorrhage, unspecified: Secondary | ICD-10-CM | POA: Diagnosis not present

## 2019-07-31 DIAGNOSIS — E1159 Type 2 diabetes mellitus with other circulatory complications: Secondary | ICD-10-CM | POA: Diagnosis not present

## 2019-07-31 DIAGNOSIS — R42 Dizziness and giddiness: Secondary | ICD-10-CM | POA: Diagnosis not present

## 2019-07-31 DIAGNOSIS — I340 Nonrheumatic mitral (valve) insufficiency: Secondary | ICD-10-CM | POA: Diagnosis not present

## 2019-07-31 DIAGNOSIS — I700 Atherosclerosis of aorta: Secondary | ICD-10-CM | POA: Diagnosis present

## 2019-07-31 DIAGNOSIS — E669 Obesity, unspecified: Secondary | ICD-10-CM | POA: Diagnosis present

## 2019-07-31 DIAGNOSIS — I63441 Cerebral infarction due to embolism of right cerebellar artery: Secondary | ICD-10-CM | POA: Diagnosis not present

## 2019-07-31 DIAGNOSIS — I10 Essential (primary) hypertension: Secondary | ICD-10-CM | POA: Diagnosis present

## 2019-07-31 DIAGNOSIS — Z888 Allergy status to other drugs, medicaments and biological substances status: Secondary | ICD-10-CM

## 2019-07-31 DIAGNOSIS — E118 Type 2 diabetes mellitus with unspecified complications: Secondary | ICD-10-CM | POA: Diagnosis present

## 2019-07-31 DIAGNOSIS — R270 Ataxia, unspecified: Secondary | ICD-10-CM | POA: Diagnosis present

## 2019-07-31 DIAGNOSIS — Z7982 Long term (current) use of aspirin: Secondary | ICD-10-CM

## 2019-07-31 DIAGNOSIS — Z6832 Body mass index (BMI) 32.0-32.9, adult: Secondary | ICD-10-CM

## 2019-07-31 DIAGNOSIS — H9191 Unspecified hearing loss, right ear: Secondary | ICD-10-CM | POA: Diagnosis present

## 2019-07-31 DIAGNOSIS — R29818 Other symptoms and signs involving the nervous system: Secondary | ICD-10-CM | POA: Diagnosis not present

## 2019-07-31 DIAGNOSIS — R4781 Slurred speech: Secondary | ICD-10-CM | POA: Diagnosis not present

## 2019-07-31 DIAGNOSIS — I252 Old myocardial infarction: Secondary | ICD-10-CM | POA: Diagnosis not present

## 2019-07-31 DIAGNOSIS — Z818 Family history of other mental and behavioral disorders: Secondary | ICD-10-CM | POA: Diagnosis not present

## 2019-07-31 DIAGNOSIS — N183 Chronic kidney disease, stage 3 unspecified: Secondary | ICD-10-CM | POA: Diagnosis present

## 2019-07-31 DIAGNOSIS — B009 Herpesviral infection, unspecified: Secondary | ICD-10-CM

## 2019-07-31 DIAGNOSIS — E1165 Type 2 diabetes mellitus with hyperglycemia: Secondary | ICD-10-CM | POA: Diagnosis not present

## 2019-07-31 DIAGNOSIS — I161 Hypertensive emergency: Secondary | ICD-10-CM | POA: Diagnosis present

## 2019-07-31 DIAGNOSIS — Z20822 Contact with and (suspected) exposure to covid-19: Secondary | ICD-10-CM | POA: Diagnosis present

## 2019-07-31 DIAGNOSIS — E785 Hyperlipidemia, unspecified: Secondary | ICD-10-CM | POA: Diagnosis present

## 2019-07-31 DIAGNOSIS — Z7984 Long term (current) use of oral hypoglycemic drugs: Secondary | ICD-10-CM

## 2019-07-31 DIAGNOSIS — I639 Cerebral infarction, unspecified: Secondary | ICD-10-CM | POA: Diagnosis not present

## 2019-07-31 DIAGNOSIS — Z955 Presence of coronary angioplasty implant and graft: Secondary | ICD-10-CM | POA: Diagnosis not present

## 2019-07-31 DIAGNOSIS — G4489 Other headache syndrome: Secondary | ICD-10-CM | POA: Diagnosis not present

## 2019-07-31 DIAGNOSIS — Z8673 Personal history of transient ischemic attack (TIA), and cerebral infarction without residual deficits: Secondary | ICD-10-CM | POA: Diagnosis not present

## 2019-07-31 DIAGNOSIS — E1122 Type 2 diabetes mellitus with diabetic chronic kidney disease: Secondary | ICD-10-CM | POA: Diagnosis present

## 2019-07-31 DIAGNOSIS — I6523 Occlusion and stenosis of bilateral carotid arteries: Secondary | ICD-10-CM | POA: Diagnosis not present

## 2019-07-31 DIAGNOSIS — R519 Headache, unspecified: Secondary | ICD-10-CM | POA: Diagnosis not present

## 2019-07-31 DIAGNOSIS — Z7902 Long term (current) use of antithrombotics/antiplatelets: Secondary | ICD-10-CM

## 2019-07-31 DIAGNOSIS — I6389 Other cerebral infarction: Secondary | ICD-10-CM | POA: Diagnosis not present

## 2019-07-31 LAB — CBC
HCT: 45.2 % (ref 39.0–52.0)
Hemoglobin: 15.5 g/dL (ref 13.0–17.0)
MCH: 32 pg (ref 26.0–34.0)
MCHC: 34.3 g/dL (ref 30.0–36.0)
MCV: 93.2 fL (ref 80.0–100.0)
Platelets: 133 10*3/uL — ABNORMAL LOW (ref 150–400)
RBC: 4.85 MIL/uL (ref 4.22–5.81)
RDW: 11.9 % (ref 11.5–15.5)
WBC: 8.8 10*3/uL (ref 4.0–10.5)
nRBC: 0 % (ref 0.0–0.2)

## 2019-07-31 LAB — APTT: aPTT: 24 seconds (ref 24–36)

## 2019-07-31 LAB — COMPREHENSIVE METABOLIC PANEL
ALT: 35 U/L (ref 0–44)
AST: 26 U/L (ref 15–41)
Albumin: 4 g/dL (ref 3.5–5.0)
Alkaline Phosphatase: 79 U/L (ref 38–126)
Anion gap: 11 (ref 5–15)
BUN: 16 mg/dL (ref 6–20)
CO2: 25 mmol/L (ref 22–32)
Calcium: 9.4 mg/dL (ref 8.9–10.3)
Chloride: 100 mmol/L (ref 98–111)
Creatinine, Ser: 1.34 mg/dL — ABNORMAL HIGH (ref 0.61–1.24)
GFR calc Af Amer: >60 mL/min (ref >60)
GFR calc non Af Amer: 58 mL/min — ABNORMAL LOW (ref >60)
Glucose, Bld: 190 mg/dL — ABNORMAL HIGH (ref 70–99)
Potassium: 4.2 mmol/L (ref 3.5–5.1)
Sodium: 136 mmol/L (ref 135–145)
Total Bilirubin: 0.9 mg/dL (ref 0.3–1.2)
Total Protein: 6.4 g/dL — ABNORMAL LOW (ref 6.5–8.1)

## 2019-07-31 LAB — DIFFERENTIAL
Abs Immature Granulocytes: 0.03 10*3/uL (ref 0.00–0.07)
Basophils Absolute: 0 10*3/uL (ref 0.0–0.1)
Basophils Relative: 1 %
Eosinophils Absolute: 0.2 10*3/uL (ref 0.0–0.5)
Eosinophils Relative: 3 %
Immature Granulocytes: 0 %
Lymphocytes Relative: 19 %
Lymphs Abs: 1.6 10*3/uL (ref 0.7–4.0)
Monocytes Absolute: 0.7 10*3/uL (ref 0.1–1.0)
Monocytes Relative: 8 %
Neutro Abs: 6.2 10*3/uL (ref 1.7–7.7)
Neutrophils Relative %: 69 %

## 2019-07-31 LAB — ETHANOL: Alcohol, Ethyl (B): <10 mg/dL (ref <10)

## 2019-07-31 LAB — PROTIME-INR
INR: 1 (ref 0.8–1.2)
Prothrombin Time: 12.5 seconds (ref 11.4–15.2)

## 2019-07-31 LAB — I-STAT CHEM 8, ED
BUN: 17 mg/dL (ref 6–20)
Calcium, Ion: 1.13 mmol/L — ABNORMAL LOW (ref 1.15–1.40)
Chloride: 100 mmol/L (ref 98–111)
Creatinine, Ser: 1.2 mg/dL (ref 0.61–1.24)
Glucose, Bld: 175 mg/dL — ABNORMAL HIGH (ref 70–99)
HCT: 44 % (ref 39.0–52.0)
Hemoglobin: 15 g/dL (ref 13.0–17.0)
Potassium: 4.1 mmol/L (ref 3.5–5.1)
Sodium: 136 mmol/L (ref 135–145)
TCO2: 27 mmol/L (ref 22–32)

## 2019-07-31 LAB — GLUCOSE, CAPILLARY: Glucose-Capillary: 242 mg/dL — ABNORMAL HIGH (ref 70–99)

## 2019-07-31 MED ORDER — SODIUM CHLORIDE 0.9 % IV SOLN: 50.0000 mL | Freq: Once | INTRAVENOUS | Status: DC

## 2019-07-31 MED ORDER — CHLORHEXIDINE GLUCONATE CLOTH 2 % EX PADS: 6.0000 | MEDICATED_PAD | Freq: Every day | CUTANEOUS | Status: DC

## 2019-07-31 MED ORDER — CLEVIDIPINE BUTYRATE 0.5 MG/ML IV EMUL
0.0000 mg/h | INTRAVENOUS | Status: DC
  Administered 2019-07-31: 4 mg/h via INTRAVENOUS
  Administered 2019-07-31: 23:00:00 8 mg/h via INTRAVENOUS
  Filled 2019-07-31: qty 50

## 2019-07-31 MED ORDER — SODIUM CHLORIDE 0.9 % IV SOLN: INTRAVENOUS | Status: DC

## 2019-07-31 MED ORDER — ACETAMINOPHEN 160 MG/5ML PO SOLN: 650.0000 mg | ORAL | Status: DC | PRN

## 2019-07-31 MED ORDER — IOHEXOL 350 MG/ML SOLN
75.0000 mL | Freq: Once | INTRAVENOUS | Status: AC | PRN
  Administered 2019-07-31: 75 mL via INTRAVENOUS

## 2019-07-31 MED ORDER — ACETAMINOPHEN 650 MG RE SUPP: 650.0000 mg | RECTAL | Status: DC | PRN

## 2019-07-31 MED ORDER — LABETALOL HCL 5 MG/ML IV SOLN
20.0000 mg | Freq: Once | INTRAVENOUS | Status: AC
  Administered 2019-07-31: 20:00:00 20 mg via INTRAVENOUS

## 2019-07-31 MED ORDER — SODIUM CHLORIDE 0.9 % IV SOLN
50.0000 mL/h | INTRAVENOUS | Status: DC
  Administered 2019-07-31 – 2019-08-01 (×2): 50 mL/h via INTRAVENOUS

## 2019-07-31 MED ORDER — ALTEPLASE (STROKE) FULL DOSE INFUSION
90.0000 mg | Freq: Once | INTRAVENOUS | Status: AC
  Administered 2019-07-31: 90 mg via INTRAVENOUS
  Filled 2019-07-31: qty 100

## 2019-07-31 MED ORDER — ACETAMINOPHEN 325 MG PO TABS
650.0000 mg | ORAL_TABLET | ORAL | Status: DC | PRN
  Administered 2019-08-02: 650 mg via ORAL
  Filled 2019-07-31: qty 2

## 2019-07-31 MED ORDER — STROKE: EARLY STAGES OF RECOVERY BOOK
Freq: Once | Status: AC
  Filled 2019-07-31: qty 1

## 2019-07-31 MED ORDER — PANTOPRAZOLE SODIUM 40 MG IV SOLR: 40.0000 mg | Freq: Every day | INTRAVENOUS | Status: DC

## 2019-07-31 MED ORDER — INSULIN ASPART 100 UNIT/ML ~~LOC~~ SOLN
0.0000 [IU] | SUBCUTANEOUS | Status: DC
  Administered 2019-07-31: 5 [IU] via SUBCUTANEOUS
  Administered 2019-08-01: 09:00:00 2 [IU] via SUBCUTANEOUS
  Administered 2019-08-01 (×2): 5 [IU] via SUBCUTANEOUS
  Administered 2019-08-01: 05:00:00 3 [IU] via SUBCUTANEOUS
  Administered 2019-08-01: 14:00:00 8 [IU] via SUBCUTANEOUS

## 2019-07-31 MED ORDER — CLEVIDIPINE BUTYRATE 0.5 MG/ML IV EMUL
INTRAVENOUS | Status: AC
  Filled 2019-07-31: qty 50

## 2019-07-31 NOTE — ED Provider Notes (Signed)
[facility] EMERGENCY DEPARTMENT Provider Note   CSN: [id number] Arrival date & time: 07/31/19  [id number]     History Chief Complaint  Patient presents with  . Headache  . Dizziness  . Weakness    Karl Morgan is a 59 y.o. male.  Patient is a 59 year old male with past medical history of prior CVA, type 2 diabetes, coronary artery disease with stent, hypertension, hyperlipidemia.  He presents today for evaluation of acute onset of headache, facial numbness, blurred vision, and buzzing in his ears.  This came on suddenly while he was helping remodel his mother's home.  Symptoms started at approximately 6 PM this evening.  EMS was called and the patient was transported here.  Initially, when the symptoms began, he was unable to keep his balance and walk and had to crawl.  BP by EMS was elevated over 200.  The history is provided by the patient.  Headache Pain location:  Generalized Quality:  Dull Radiates to:  Does not radiate Onset quality:  Sudden Timing:  Constant      Past Medical History:  Diagnosis Date  . Alopecia 04/04/2011  . Diabetes mellitus   . Erectile dysfunction 04/04/2011  . Heart murmur   . Hyperlipidemia 04/04/2011  . Hypogonadism male 04/04/2011  . Myocardial infarction ([place])   . Stroke ([place])   . Substance abuse ([place])   . Type II or unspecified type diabetes mellitus without mention of complication, uncontrolled 04/04/2011  . Unspecified essential hypertension 04/04/2011    Patient Active Problem List   Diagnosis Date Noted  . Arthritis 10/21/2018  . Sinusitis 10/21/2018  . Diabetes mellitus type 2 in obese ([place]) 08/22/2017  . Obesity (BMI 30.0-34.9) 08/22/2017  . Herpes simplex type 1 infection 02/21/2015  . Recurrent oral herpes simplex infection 02/21/2015  . History of stroke 02/16/2015  . Arteriosclerosis of coronary artery 10/07/2014  . Heart attack ([place]) 10/07/2014  . Hypotestosteronism 10/07/2014  . B12 deficiency 12/18/2011  .  Hypothyroidism 05/04/2011  . Annual physical exam 04/05/2011  . Diabetes mellitus type 2, controlled, with complications ([place]) 0000000  . Essential hypertension 04/04/2011  . Hyperlipidemia 04/04/2011  . Hypogonadism male 04/04/2011  . Alopecia 04/04/2011  . Erectile dysfunction 04/04/2011    Past Surgical History:  Procedure Laterality Date  . CARDIAC CATHETERIZATION         Family History  Problem Relation Age of Onset  . Hypertension Father   . Dementia Father        died age 43 in 07-02-2017  . Memory loss Father   . Emphysema Mother   . COPD Mother   . Thyroid disease Mother   . Cancer Neg Hx   . Diabetes Neg Hx   . Heart disease Neg Hx   . Stroke Neg Hx     Social History   Tobacco Use  . Smoking status: Never Smoker  . Smokeless tobacco: Never Used  Substance Use Topics  . Alcohol use: No    Comment: former etoh abuse in 52s   . Drug use: No    Home Medications Prior to Admission medications   Medication Sig Start Date End Date Taking? Authorizing Provider  Alprostadil, Vasodilator, (EDEX IC) 1 Dose by Intracavernosal route as needed (for erectile dysfunstion). Pt is unsure of the dose.    [provider]  aspirin EC 81 MG tablet Take 81 mg by mouth daily.    [provider]  atorvastatin (LIPITOR) 80 MG tablet Take 1  tablet (80 mg total) by mouth daily at 6 PM. 10/21/18   McLean-Scocuzza, Nino Glow, MD  clopidogrel (PLAVIX) 75 MG tablet Take 75 mg by mouth daily.    [provider]  glimepiride (AMARYL) 2 MG tablet Take 1 tablet (2 mg total) by mouth daily with breakfast. 04/15/19   McLean-Scocuzza, Nino Glow, MD  lisinopril (ZESTRIL) 20 MG tablet Take 1 tablet (20 mg total) by mouth daily. 02/24/19   McLean-Scocuzza, Nino Glow, MD  metFORMIN (GLUCOPHAGE) 850 MG tablet Take 1 tablet (850 mg total) by mouth daily with breakfast. 05/18/19   McLean-Scocuzza, Nino Glow, MD  metoprolol succinate (TOPROL-XL) 50 MG 24 hr tablet Take 1 tablet (50 mg  total) by mouth daily. Take with or immediately following a meal. 07/22/18   Lada, Satira Anis, MD  nitroGLYCERIN (NITROSTAT) 0.4 MG SL tablet Take 1 tablet under tongue as directed as needed [Take 1 tablet q5 minutes with max dose of 3 doses. If 3rd dose is needed, call 911]    [provider]  sildenafil (VIAGRA) 100 MG tablet Take 0.5-1 tablets (50-100 mg total) by mouth daily as needed for erectile dysfunction. 11/10/12   Wendie Agreste, MD  sitaGLIPtin (JANUVIA) 100 MG tablet Take 1 tablet (100 mg total) by mouth daily. 07/22/19   McLean-Scocuzza, Nino Glow, MD  testosterone cypionate (DEPOTESTOSTERONE CYPIONATE) 200 MG/ML injection Inject 200 mg into the muscle every 7 (seven) days. Pt uses on Saturday.    [provider]  valACYclovir (VALTREX) 1000 MG tablet Take two pills (2,000 mg) by mouth at onset of oral fever blister, then two more pills twelve hours later x 1 day 10/21/18   McLean-Scocuzza, Nino Glow, MD    Allergies    Pravachol [pravastatin sodium]  Review of Systems   Review of Systems  All other systems reviewed and are negative.   Physical Exam Updated Vital Signs BP (!) 196/107 (BP Location: Right Arm)   Pulse 87   Temp 97.7 F (36.5 C) (Oral)   Resp 17   SpO2 100%   Physical Exam Vitals and nursing note reviewed.  Constitutional:      General: He is not in acute distress.    Appearance: He is well-developed. He is not diaphoretic.  HENT:     Head: Normocephalic and atraumatic.  Eyes:     General: No visual field deficit. Cardiovascular:     Rate and Rhythm: Normal rate and regular rhythm.     Heart sounds: No murmur. No friction rub.  Pulmonary:     Effort: Pulmonary effort is normal. No respiratory distress.     Breath sounds: Normal breath sounds. No wheezing or rales.  Abdominal:     General: Bowel sounds are normal. There is no distension.     Palpations: Abdomen is soft.     Tenderness: There is no abdominal tenderness.    Musculoskeletal:        General: Normal range of motion.     Cervical back: Normal range of motion and neck supple.  Skin:    General: Skin is warm and dry.  Neurological:     Mental Status: He is alert and oriented to person, place, and time.     Cranial Nerves: No cranial nerve deficit or facial asymmetry.     Sensory: No sensory deficit.     Motor: No weakness.     Coordination: Coordination normal.     ED Results / Procedures / Treatments   Labs (all labs ordered  are listed, but only abnormal results are displayed) Labs Reviewed  ETHANOL  PROTIME-INR  APTT  CBC  DIFFERENTIAL  COMPREHENSIVE METABOLIC PANEL  RAPID URINE DRUG SCREEN, HOSP PERFORMED  URINALYSIS, ROUTINE W REFLEX MICROSCOPIC  I-STAT CHEM 8, ED    EKG EKG Interpretation  Date/Time:  Friday July 31 2019 19:17:58 EDT Ventricular Rate:  84 PR Interval:    QRS Duration: 102 QT Interval:  378 QTC Calculation: 447 R Axis:   64 Text Interpretation: Sinus rhythm Low voltage, precordial leads No significant change since 02/16/2015 Confirmed by Veryl Speak [phone]) on 07/31/2019 7:36:38 PM   Radiology No results found.  Procedures Procedures (including critical care time)  Medications Ordered in ED Medications  0.9 %  sodium chloride infusion (has no administration in time range)    ED Course  I have reviewed the triage vital signs and the nursing notes.  Pertinent labs & imaging results that were available during my care of the patient were reviewed by me and considered in my medical decision making (see chart for details).    MDM Rules/Calculators/A&P  Patient is a 59 year old male with history of prior CVA presenting with complaints of sudden onset dizziness, facial numbness, and dysarthria.  This occurred while he was remodeling a house.  Patient had difficulty ambulating and had to crawl to find help.  He was brought here by EMS approximately 80 minutes into the onset of his  illness.  Patient arrived here hypertensive but otherwise stable.  His neurologic exam at this point is essentially nonfocal with equal strength in his arms and legs and no facial droop.  He does continue to complain of dizziness.  Due to his prompt presentation and noted onset of symptoms, a code stroke was initiated and patient evaluated by Dr. Leonel Ramsay.  He emergently went for MRI and laboratory studies were obtained.  Per Dr. Leonel Ramsay will receive TPA for presumed thrombotic stroke.  He will be admitted to the neurology service for further management.  CRITICAL CARE Performed by: Veryl Speak Total critical care time: 35 minutes Critical care time was exclusive of separately billable procedures and treating other patients. Critical care was necessary to treat or prevent imminent or life-threatening deterioration. Critical care was time spent personally by me on the following activities: development of treatment plan with patient and/or surrogate as well as nursing, discussions with consultants, evaluation of patient's response to treatment, examination of patient, obtaining history from patient or surrogate, ordering and performing treatments and interventions, ordering and review of laboratory studies, ordering and review of radiographic studies, pulse oximetry and re-evaluation of patient's condition.   Final Clinical Impression(s) / ED Diagnoses Final diagnoses:  None    Rx / DC Orders ED Discharge Orders    None       Veryl Speak, MD 07/31/19 2212

## 2019-07-31 NOTE — H&P (Addendum)
Neurology H&P Reason for Consult: Dizziness Referring Physician: Stark Jock, D  CC: Dizziness  History is obtained from:patient  HPI: Karl Morgan is a 59 y.o. male with a history of previous stroke, DM, hyperlipidemia, who presents with acute dizziness, "head buzzing",  gait instability started abruptly shortly after 6 PM.  He was working on a light and then suddenly developed dizziness and had to crawl.  EMS transported him, but he was not activated his code stroke until he reached the [facility] ER.  On arrival, Dr. Stark Jock evaluated him and noted his symptoms any activated code stroke.  He was taken for an emergent head CT which was negative, and on exam he had findings consistent with acute ischemic stroke.  He was unable to walk unassisted and therefore IV TPA was offered with discussion of the risks and benefits and the patient accepted.  LKW: 6pm tpa given?: yes Premorbid modified rankin scale: 1    ROS: A 14 point ROS was performed and is negative except as noted in the HPI.   Past Medical History:  Diagnosis Date  . Alopecia 04/04/2011  . Diabetes mellitus   . Erectile dysfunction 04/04/2011  . Heart murmur   . Hyperlipidemia 04/04/2011  . Hypogonadism male 04/04/2011  . Myocardial infarction ([place])   . Stroke ([place])   . Substance abuse ([place])   . Type II or unspecified type diabetes mellitus without mention of complication, uncontrolled 04/04/2011  . Unspecified essential hypertension 04/04/2011     Family History  Problem Relation Age of Onset  . Hypertension Father   . Dementia Father        died age 39 in 28-Jun-2017  . Memory loss Father   . Emphysema Mother   . COPD Mother   . Thyroid disease Mother   . Cancer Neg Hx   . Diabetes Neg Hx   . Heart disease Neg Hx   . Stroke Neg Hx      Social History:  reports that he has never smoked. He has never used smokeless tobacco. He reports that he does not drink alcohol or use drugs.   Exam: Current vital signs: BP (!) 196/107 (BP  Location: Right Arm)   Pulse 71   Temp 97.8 F (36.6 C) (Oral)   Resp 17   SpO2 99%  Vital signs in last 24 hours: Temp:  [97.7 F (36.5 C)-97.8 F (36.6 C)] 97.8 F (36.6 C) (04/30 1927) Pulse Rate:  [71-87] 71 (04/30 28[birth year]Jun-1925) Resp:  [17] 17 (04/30 1919) BP: (196)/(107) 196/107 (04/30 1919) SpO2:  [99 %-100 %] 99 % (04/30 1926)   Physical Exam  Constitutional: Appears well-developed and well-nourished.  Psych: Affect appropriate to situation Eyes: No scleral injection HENT: No OP obstrucion MSK: no joint deformities.  Cardiovascular: Normal rate and regular rhythm.  Respiratory: Effort normal, non-labored breathing GI: Soft.  No distension. There is no tenderness.  Skin: WDI  Neuro: Mental Status: Patient is awake, alert, oriented to person, place, month, year, and situation. Patient is able to give a clear and coherent history. No signs of aphasia or neglect Cranial Nerves: II: Visual Fields are full. Pupils are slightly assymetric L 46mm and right 30mm, both are reactive to light.   III,IV, VI: EOMI with significant left beating nystagmus on leftward gaze, rotary nystagmus on upper gaze. V: Facial sensation is symmetric to temperature VII: Facial movement is symmetric.  VIII: He has diminished hearing to finger rub on the right compared to the left. X: Uvula elevates  symmetrically XI: Shoulder shrug is symmetric. XII: tongue is midline without atrophy or fasciculations.  Motor: Though he does not have clear weakness, he has impaired fine motor movements on the right compared to the left. Sensory: Sensation is symmetric to light touch and temperature in the arms and legs. Cerebellar: Ataxia on the right Gait: He is able to support himself, however he stumbles and would have fallen without assistance when trying to ambulate.   I have reviewed labs in epic and the results pertinent to this consultation are: Creatinine 1.34  I have reviewed the images obtained:  CT-negative CTA-occluded right vertebral.  Impression: 59 year old male with acute onset ataxia, dizziness, gait instability, hearing change most consistent with an [place] stroke.  It is unclear if this is embolic or thrombotic, but given the diseased vessel, I suspect thrombotic disease.  He was already on dual antiplatelet therapy with aspirin and Plavix.  He was given IV TPA, and has had significant improvement in his symptoms following the IV TPA.  Recommendations: - HgbA1c, fasting lipid panel - MRI of the brain without contrast - Frequent neuro checks - Echocardiogram - Prophylactic therapy-none for 24 hours - Risk factor modification - Telemetry monitoring - PT consult, OT consult, Speech consult - Stroke team to follow  Diabetes- SSI, hold oral for now  Hypertensive emergency- Cleviprex  This patient is critically ill and at significant risk of neurological worsening, death and care requires constant monitoring of vital signs, hemodynamics,respiratory and cardiac monitoring, neurological assessment, discussion with family, other specialists and medical decision making of high complexity. I spent 50 minutes of neurocritical care time  in the care of  this patient. This was time spent independent of any time provided by nurse practitioner or PA.  Roland Rack, MD [facility] [phone]  If 7pm- 7am, please page neurology on call as listed in [place]. 07/31/2019  10:01 PM

## 2019-07-31 NOTE — ED Triage Notes (Signed)
Pt. Complained of dizziness and a headache that started when he was looking for a screwdriver to hang a ceiling fan. Pt. Was found on hand and knees when fire department arrived, slurred speech, tongue numb and L side weakness present per EMS.

## 2019-07-31 NOTE — Progress Notes (Signed)
Pharmacist Code Stroke Response  Notified to mix tPA at 1941 by Dr. Leonel Ramsay Delivered tPA to RN at [place] tPA administered at [place]  tPA dose = 9mg  bolus over 1 minute followed by 81mg  for a total dose of 90mg  over 1 hour  Issues/delays encountered (if applicable): BP control   Karl Morgan, PharmD., BCPS, BCCCP Clinical Pharmacist Clinical phone for 07/31/19 until 11pm: [phone] If after 11pm, please refer to [facility] for unit-specific pharmacist

## 2019-07-31 NOTE — Code Documentation (Signed)
59 yo male to [facility] via EMS for dizzyness and headache, slurred speech. Upon arrival to ED, code stroke activated.  LSW 1800.  Pt has hx of prior CVA. Pt taken to CTH/CTA performed. NIHSS 4. TPA full dose administered at 1950 per Dr. Cecil Cobbs order. Labetolol 30mg  (20mg , 10mg ) and Cleviprex infusion started for BP control caused a brief delay in Alteplase infusion. New PIV also initiated.

## 2019-08-01 ENCOUNTER — Inpatient Hospital Stay (HOSPITAL_COMMUNITY): Payer: 59

## 2019-08-01 DIAGNOSIS — E1159 Type 2 diabetes mellitus with other circulatory complications: Secondary | ICD-10-CM

## 2019-08-01 DIAGNOSIS — Z8673 Personal history of transient ischemic attack (TIA), and cerebral infarction without residual deficits: Secondary | ICD-10-CM

## 2019-08-01 DIAGNOSIS — I10 Essential (primary) hypertension: Secondary | ICD-10-CM

## 2019-08-01 DIAGNOSIS — E785 Hyperlipidemia, unspecified: Secondary | ICD-10-CM

## 2019-08-01 DIAGNOSIS — I340 Nonrheumatic mitral (valve) insufficiency: Secondary | ICD-10-CM

## 2019-08-01 LAB — URINALYSIS, ROUTINE W REFLEX MICROSCOPIC
Bacteria, UA: NONE SEEN
Bilirubin Urine: NEGATIVE
Glucose, UA: >=500 mg/dL — AB
Hgb urine dipstick: NEGATIVE
Ketones, ur: 5 mg/dL — AB
Leukocytes,Ua: NEGATIVE
Nitrite: NEGATIVE
Protein, ur: NEGATIVE mg/dL
Specific Gravity, Urine: 1.011 (ref 1.005–1.030)
pH: 7 (ref 5.0–8.0)

## 2019-08-01 LAB — LIPID PANEL
Cholesterol: 130 mg/dL (ref 0–200)
HDL: 33 mg/dL — ABNORMAL LOW (ref >40)
LDL Cholesterol: 75 mg/dL (ref 0–99)
Total CHOL/HDL Ratio: 3.9 RATIO
Triglycerides: 109 mg/dL (ref <150)
VLDL: 22 mg/dL (ref 0–40)

## 2019-08-01 LAB — GLUCOSE, CAPILLARY
Glucose-Capillary: 194 mg/dL — ABNORMAL HIGH (ref 70–99)
Glucose-Capillary: 178 mg/dL — ABNORMAL HIGH (ref 70–99)
Glucose-Capillary: 279 mg/dL — ABNORMAL HIGH (ref 70–99)
Glucose-Capillary: 262 mg/dL — ABNORMAL HIGH (ref 70–99)
Glucose-Capillary: 231 mg/dL — ABNORMAL HIGH (ref 70–99)
Glucose-Capillary: 218 mg/dL — ABNORMAL HIGH (ref 70–99)

## 2019-08-01 LAB — HIV ANTIBODY (ROUTINE TESTING W REFLEX): HIV Screen 4th Generation wRfx: NONREACTIVE

## 2019-08-01 LAB — BASIC METABOLIC PANEL
Anion gap: 9 (ref 5–15)
BUN: 13 mg/dL (ref 6–20)
CO2: 24 mmol/L (ref 22–32)
Calcium: 9.3 mg/dL (ref 8.9–10.3)
Chloride: 103 mmol/L (ref 98–111)
Creatinine, Ser: 1.2 mg/dL (ref 0.61–1.24)
GFR calc Af Amer: >60 mL/min (ref >60)
GFR calc non Af Amer: >60 mL/min (ref >60)
Glucose, Bld: 235 mg/dL — ABNORMAL HIGH (ref 70–99)
Potassium: 4.2 mmol/L (ref 3.5–5.1)
Sodium: 136 mmol/L (ref 135–145)

## 2019-08-01 LAB — RAPID URINE DRUG SCREEN, HOSP PERFORMED
Amphetamines: NOT DETECTED
Barbiturates: NOT DETECTED
Benzodiazepines: NOT DETECTED
Cocaine: NOT DETECTED
Opiates: NOT DETECTED
Tetrahydrocannabinol: NOT DETECTED

## 2019-08-01 LAB — ECHOCARDIOGRAM COMPLETE
Height: 72 in
Weight: 3848.35 oz

## 2019-08-01 LAB — CBC
HCT: 45.3 % (ref 39.0–52.0)
Hemoglobin: 16.1 g/dL (ref 13.0–17.0)
MCH: 32.7 pg (ref 26.0–34.0)
MCHC: 35.5 g/dL (ref 30.0–36.0)
MCV: 92.1 fL (ref 80.0–100.0)
Platelets: 145 10*3/uL — ABNORMAL LOW (ref 150–400)
RBC: 4.92 MIL/uL (ref 4.22–5.81)
RDW: 12 % (ref 11.5–15.5)
WBC: 11.9 10*3/uL — ABNORMAL HIGH (ref 4.0–10.5)
nRBC: 0 % (ref 0.0–0.2)

## 2019-08-01 LAB — HEMOGLOBIN A1C
Hgb A1c MFr Bld: 8.1 % — ABNORMAL HIGH (ref 4.8–5.6)
Mean Plasma Glucose: 185.77 mg/dL

## 2019-08-01 LAB — MRSA PCR SCREENING: MRSA by PCR: NEGATIVE

## 2019-08-01 LAB — SARS CORONAVIRUS 2 (TAT 6-24 HRS): SARS Coronavirus 2: NEGATIVE

## 2019-08-01 MED ORDER — INSULIN ASPART 100 UNIT/ML ~~LOC~~ SOLN
0.0000 [IU] | Freq: Three times a day (TID) | SUBCUTANEOUS | Status: DC
  Administered 2019-08-02: 17:00:00 8 [IU] via SUBCUTANEOUS
  Administered 2019-08-02 – 2019-08-03 (×3): 3 [IU] via SUBCUTANEOUS

## 2019-08-01 MED ORDER — ATORVASTATIN CALCIUM 80 MG PO TABS: 80.0000 mg | ORAL_TABLET | Freq: Every day | ORAL | Status: DC

## 2019-08-01 MED ORDER — PANTOPRAZOLE SODIUM 40 MG PO TBEC
40.0000 mg | DELAYED_RELEASE_TABLET | Freq: Every day | ORAL | Status: DC
  Administered 2019-08-01 – 2019-08-03 (×3): 40 mg via ORAL
  Filled 2019-08-01 (×3): qty 1

## 2019-08-01 MED ORDER — METOPROLOL SUCCINATE ER 50 MG PO TB24
50.0000 mg | ORAL_TABLET | Freq: Every day | ORAL | Status: DC
  Administered 2019-08-01 – 2019-08-03 (×3): 50 mg via ORAL
  Filled 2019-08-01 (×3): qty 1

## 2019-08-01 MED ORDER — LISINOPRIL 20 MG PO TABS
20.0000 mg | ORAL_TABLET | Freq: Every day | ORAL | Status: DC
  Administered 2019-08-01 – 2019-08-03 (×3): 20 mg via ORAL
  Filled 2019-08-01 (×3): qty 1

## 2019-08-01 MED ORDER — ATORVASTATIN CALCIUM 80 MG PO TABS
80.0000 mg | ORAL_TABLET | Freq: Every day | ORAL | Status: DC
  Administered 2019-08-01 – 2019-08-03 (×3): 80 mg via ORAL
  Filled 2019-08-01 (×3): qty 1

## 2019-08-01 MED ORDER — LABETALOL HCL 5 MG/ML IV SOLN: 10.0000 mg | INTRAVENOUS | Status: DC | PRN

## 2019-08-01 NOTE — Evaluation (Signed)
Occupational Therapy Evaluation Patient Details Name: Karl Morgan MRN: [id number] DOB: 10-15-1960 Today's Date: 08/01/2019    History of Present Illness 59 y.o. male with history of a previous stroke, DM, CAD with previous MI, hx of substance abuse and hyperlipidemia, who presents with acute dizziness, "head buzzing",  and gait instability. TPA recieved   Clinical Impression   PTA pt fully independent, working full time. At time of eval pt presents at mod I level for BADL activity. Pt was able to demonstrate a toilet transfer, standing toileting, and functional mobility without LOB, safety risk, or AD. Pt endorses some R sided coordination deficits. Upon evaluation, coordination deficits are very mild. Educated pt on coordination building strategies for home. Tested RUE proprioception which was unremarkable. Vision and cognition are intact. No strength deficits noted. OT will sign off for no further needs are identified, pt is back to baseline. Thank you for this consult.     Follow Up Recommendations  No OT follow up    Equipment Recommendations  None recommended by OT    Recommendations for Other Services       Precautions / Restrictions Precautions Precautions: Fall Restrictions Weight Bearing Restrictions: No      Mobility Bed Mobility Overal bed mobility: Modified Independent                Transfers Overall transfer level: Independent                    Balance Overall balance assessment: Modified Independent                                         ADL either performed or assessed with clinical judgement   ADL Overall ADL's : Modified independent                                       General ADL Comments: Pt completing BADL at mod I level. Pt completed toilet transfer without external assist or device. Functional mobility was without LOB     Vision Baseline Vision/History: Wears glasses Wears Glasses: At all  times Patient Visual Report: No change from baseline Vision Assessment?: No apparent visual deficits     Perception     Praxis      Pertinent Vitals/Pain Pain Assessment: No/denies pain     Hand Dominance Right   Extremity/Trunk Assessment Upper Extremity Assessment Upper Extremity Assessment: Overall WFL for tasks assessed(mild RUE incoordination)   Lower Extremity Assessment Lower Extremity Assessment: Defer to PT evaluation       Communication Communication Communication: No difficulties   Cognition Arousal/Alertness: Awake/alert Behavior During Therapy: WFL for tasks assessed/performed Overall Cognitive Status: Within Functional Limits for tasks assessed                                     General Comments       Exercises Other Exercises Other Exercises: Educated pt on RUE coordination exercises (target practice, timing self with target practice, etc)   Shoulder Instructions      Home Living Family/patient expects to be discharged to:: Private residence Living Arrangements: Spouse/significant other Available Help at Discharge: Family Type of Home: House Home Access: Stairs to enter [employer] of  Steps: 1 Entrance Stairs-Rails: None Home Layout: One level         Bathroom Toilet: Standard Bathroom Accessibility: No   Home Equipment: None          Prior Functioning/Environment Level of Independence: Independent        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, contracting)        OT Problem List: Decreased knowledge of use of DME or AE;Decreased activity tolerance      OT Treatment/Interventions:      OT Goals(Current goals can be found in the care plan section) Acute Rehab OT Goals Patient Stated Goal: return to independence OT Goal Formulation: All assessment and education complete, DC therapy  OT Frequency:     Barriers to D/C:            Co-evaluation              AM-PAC OT "6 Clicks" Daily Activity     Outcome  Measure Help from another person eating meals?: None Help from another person taking care of personal grooming?: None Help from another person toileting, which includes using toliet, bedpan, or urinal?: None Help from another person bathing (including washing, rinsing, drying)?: None Help from another person to put on and taking off regular upper body clothing?: None Help from another person to put on and taking off regular lower body clothing?: None 6 Click Score: 24   End of Session Nurse Communication: Mobility status  Activity Tolerance: Patient tolerated treatment well Patient left: in bed;with call bell/phone within reach  OT Visit Diagnosis: Other symptoms and signs involving the nervous system (R29.898)                Time: [id number] OT Time Calculation (min): 10 min Charges:  OT General Charges $OT Visit: 1 Visit OT Evaluation $OT Eval Low Complexity: 1 Low  Zenovia Jarred, MSOT, OTR/L Acute Rehabilitation Services [facility] Office Number: [phone] Pager: [phone]  Zenovia Jarred 08/01/2019, 4:57 PM

## 2019-08-01 NOTE — Progress Notes (Signed)
  Echocardiogram 2D Echocardiogram has been performed.  Karl Morgan 08/01/2019, 3:32 PM

## 2019-08-01 NOTE — Progress Notes (Signed)
PT Cancellation Note  Patient Details Name: Karl Morgan MRN: [id number] DOB: [birth year]   Cancelled Treatment:    Reason Eval/Treat Not Completed: Patient not medically ready;Active bedrest order   Duncan Dull 08/01/2019, 7:06 AM Alben Deeds, PT DPT  Board Certified Neurologic Specialist [place] Office [phone]

## 2019-08-01 NOTE — Progress Notes (Signed)
Patient arrived from ICU via bed; patient is alert and oriented X4; patient oriented to room and unit routine; wife present at bedside; bed low and locked; fall safety reviewed; patient cooperative.

## 2019-08-01 NOTE — Progress Notes (Signed)
STROKE TEAM PROGRESS NOTE   INTERVAL HISTORY His wife and RN are at the bedside.  Pt is awake alert and doing well. He felt his symptoms have resolved, however, has not got up to walk yet. Passed swallow, will put on diet. Neuro intact. Pt had cortical stroke in 2006, recommended TEE and loop but did not get those. Will do this time. Will also consider clinical studies.    OBJECTIVE Vitals:   08/01/19 0600 08/01/19 0615 08/01/19 0630 08/01/19 0645  BP: 123/83 124/83 (!) 136/93   Pulse: 70 68 62 69  Resp: 16 14 14  (!) 9  Temp:      TempSrc:      SpO2: 97% 98% 97%   Weight:      Height:        CBC:  Recent Labs  Lab 07/31/19 1938 08/01/19 0235  WBC 8.8 11.9*  NEUTROABS 6.2  --   HGB 15.5  15.0 16.1  HCT 45.2  44.0 45.3  MCV 93.2 92.1  PLT 133* 145*    Basic Metabolic Panel:  Recent Labs  Lab 07/31/19 1938 08/01/19 0235  NA 136  136 136  K 4.2  4.1 4.2  CL 100  100 103  CO2 25 24  GLUCOSE 190*  175* 235*  BUN 16  17 13   CREATININE 1.34*  1.20 1.20  CALCIUM 9.4 9.3    Lipid Panel:     Component Value Date/Time   CHOL 130 08/01/2019 0235   CHOL 109 09/22/2013 0908   TRIG 109 08/01/2019 0235   TRIG 99 09/22/2013 0908   HDL 33 (L) 08/01/2019 0235   HDL 25 (L) 09/22/2013 0908   CHOLHDL 3.9 08/01/2019 0235   VLDL 22 08/01/2019 0235   VLDL 20 09/22/2013 0908   LDLCALC 75 08/01/2019 0235   LDLCALC 71 08/22/2017 1005   LDLCALC 64 09/22/2013 0908   HgbA1c:  Lab Results  Component Value Date   HGBA1C 8.7 (H) 03/31/2019   Urine Drug Screen:     Component Value Date/Time   LABOPIA NONE DETECTED 07/31/2019 2330   COCAINSCRNUR NONE DETECTED 07/31/2019 2330   LABBENZ NONE DETECTED 07/31/2019 2330   AMPHETMU NONE DETECTED 07/31/2019 2330   THCU NONE DETECTED 07/31/2019 2330   LABBARB NONE DETECTED 07/31/2019 2330    Alcohol Level     Component Value Date/Time   ETH <10 07/31/2019 1938    IMAGING  CT Code Stroke CTA Head W/WO contrast CT Code  Stroke CTA Neck W/WO contrast 07/31/2019 IMPRESSION:  Mild atherosclerotic disease at the carotid bifurcation without significant carotid stenosis in the neck. Left vertebral artery widely patent Right vertebral artery is a small vessel with diffuse disease and occlusion at C1 No intracranial large vessel occlusion or flow limiting stenosis.   CT HEAD CODE STROKE WO CONTRAST 07/31/2019 IMPRESSION:  1. Right frontal hypodensity most likely a small chronic infarct. Negative for hemorrhage  2. ASPECTS is 10   MRI WO Contrast - pending  Transthoracic Echocardiogram  00/00/2021 Pending  ECG - SR rate 84 BPM. (See cardiology reading for complete details)   PHYSICAL EXAM  Temp:  [97.2 F (36.2 C)-98.7 F (37.1 C)] 97.2 F (36.2 C) (05/01 0800) Pulse Rate:  [62-92] 81 (05/01 0900) Resp:  [9-27] 13 (05/01 0900) BP: (73-196)/(53-114) 161/98 (05/01 0800) SpO2:  [94 %-100 %] 98 % (05/01 0900) Weight:  [109.1 kg] 109.1 kg (04/30 2300)  General - Well nourished, well developed, in no apparent distress.  Ophthalmologic -  fundi not visualized due to noncooperation.  Cardiovascular - Regular rhythm and rate.  Mental Status -  Level of arousal and orientation to time, place, and person were intact. Language including expression, naming, repetition, comprehension was assessed and found intact. Fund of Knowledge was assessed and was intact.  Cranial Nerves II - XII - II - Visual field intact OU. III, IV, VI - Extraocular movements intact. V - Facial sensation intact bilaterally. VII - Facial movement intact bilaterally. VIII - Hearing & vestibular intact bilaterally. X - Palate elevates symmetrically. XI - Chin turning & shoulder shrug intact bilaterally. XII - Tongue protrusion intact.  Motor Strength - The patient's strength was normal in all extremities and pronator drift was absent.  Bulk was normal and fasciculations were absent.   Motor Tone - Muscle tone was assessed at the  neck and appendages and was normal.  Reflexes - The patient's reflexes were symmetrical in all extremities and he had no pathological reflexes.  Sensory - Light touch, temperature/pinprick were assessed and were symmetrical.    Coordination - The patient had normal movements in the hands and feet with no ataxia or dysmetria.  Tremor was absent.  Gait and Station - deferred.   ASSESSMENT/PLAN [name] is a 59 y.o. male with history of a previous stroke, DM, CAD with previous MI, hx of substance abuse and hyperlipidemia, who presents with acute dizziness, "head buzzing",  and gait instability which started abruptly shortly after 6 PM.  The patient received IV t-PA Friday 07/31/19 at 21:15  Stroke:  likely cerebellar infarct - MRI pending  Resultant symptoms resolved  Code Stroke CT Head - Right frontal hypodensity most likely a small chronic infarct. Negative for hemorrhage. ASPECTS is 10   CTA H&N - Mild atherosclerotic disease at the carotid bifurcation without significant carotid stenosis in the neck. 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 No intracranial large vessel occlusion or flow limiting stenosis.  MRI head - pending  2D Echo - pending  Given his recurrent strokes on DAPT, will recommend TEE and loop recorder  [employer] Virus 2 - negative  LDL - 75  HgbA1c - 8.7  UDS - neg  VTE prophylaxis - SCDs  aspirin 81 mg daily and clopidogrel 75 mg daily prior to admission, now on No antithrombotic within 24h of tPA  Patient will be counseled to be compliant with his antithrombotic medications  Ongoing aggressive stroke risk factor management  Therapy recommendations:  pending  Disposition:  Pending  Hx of stroke  02/2015 - HA, left arm weakness and difficulty walking, MRI right motor strip small infarct, EF 60-65%. MRA head and neck neg but  right VA  Hypoplastic and occluded at C1. A1C 7.2 and LDL 57. As per pt, he was on DAPT since then. He was also put on lipitor 80. Recommended TEE and loop at that time but not done.   Hypertension  Home BP meds: Zestril, Toprol  Off Cleviprex now . BP goal < 180/105 after tPA . Resume home BP meds . Long term BP goal normotensive  Hyperlipidemia  Home Lipid lowering medication: Lipitor 80 mg daily  LDL 75, goal < 70  Current lipid lowering medication: lipitor 80  Continue statin at discharge  Diabetes  Home diabetic meds: Januvia, Amaryl, metformin  Current diabetic meds: SSI   HgbA1c 8.7, goal < 7.0  CBG  monitoring  Close PCP follow up  Other Stroke Risk Factors  Obesity, Body mass index is 32.62 kg/m., recommend weight loss, diet and exercise as appropriate   Coronary artery disease  Other Active Problems  Code status - Full code  Hx of substance abuse - UDS neg  Hospital day # 1  This patient is critically ill due to recurrent stroke, s/p tPA, hyperglycemia and at significant risk of neurological worsening, death form recurrent stroke, hemorrhagic conversion, bleeding, seizure, DKA. This patient's care requires constant monitoring of vital signs, hemodynamics, respiratory and cardiac monitoring, review of multiple databases, neurological assessment, discussion with family, other specialists and medical decision making of high complexity. I spent 35 minutes of neurocritical care time in the care of this patient. I had long discussion with pt and wife at bedside, updated pt current condition, treatment plan and potential prognosis, and answered all the questions. They expressed understanding and appreciation.    Rosalin Hawking, MD PhD Stroke Neurology 08/01/2019 9:37 AM    To contact Stroke Continuity provider, please refer to [email or web address] After hours, contact General Neurology

## 2019-08-01 NOTE — H&P (View-Only) (Signed)
STROKE TEAM PROGRESS NOTE   INTERVAL HISTORY His wife and RN are at the bedside.  Pt is awake alert and doing well. He felt his symptoms have resolved, however, has not got up to walk yet. Passed swallow, will put on diet. Neuro intact. Pt had cortical stroke in 2006, recommended TEE and loop but did not get those. Will do this time. Will also consider clinical studies.    OBJECTIVE Vitals:   08/01/19 0600 08/01/19 0615 08/01/19 0630 08/01/19 0645  BP: 123/83 124/83 (!) 136/93   Pulse: 70 68 62 69  Resp: 16 14 14  (!) 9  Temp:      TempSrc:      SpO2: 97% 98% 97%   Weight:      Height:        CBC:  Recent Labs  Lab 07/31/19 1938 08/01/19 0235  WBC 8.8 11.9*  NEUTROABS 6.2  --   HGB 15.5  15.0 16.1  HCT 45.2  44.0 45.3  MCV 93.2 92.1  PLT 133* 145*    Basic Metabolic Panel:  Recent Labs  Lab 07/31/19 1938 08/01/19 0235  NA 136  136 136  K 4.2  4.1 4.2  CL 100  100 103  CO2 25 24  GLUCOSE 190*  175* 235*  BUN 16  17 13   CREATININE 1.34*  1.20 1.20  CALCIUM 9.4 9.3    Lipid Panel:     Component Value Date/Time   CHOL 130 08/01/2019 0235   CHOL 109 09/22/2013 0908   TRIG 109 08/01/2019 0235   TRIG 99 09/22/2013 0908   HDL 33 (L) 08/01/2019 0235   HDL 25 (L) 09/22/2013 0908   CHOLHDL 3.9 08/01/2019 0235   VLDL 22 08/01/2019 0235   VLDL 20 09/22/2013 0908   LDLCALC 75 08/01/2019 0235   LDLCALC 71 08/22/2017 1005   LDLCALC 64 09/22/2013 0908   HgbA1c:  Lab Results  Component Value Date   HGBA1C 8.7 (H) 03/31/2019   Urine Drug Screen:     Component Value Date/Time   LABOPIA NONE DETECTED 07/31/2019 2330   COCAINSCRNUR NONE DETECTED 07/31/2019 2330   LABBENZ NONE DETECTED 07/31/2019 2330   AMPHETMU NONE DETECTED 07/31/2019 2330   THCU NONE DETECTED 07/31/2019 2330   LABBARB NONE DETECTED 07/31/2019 2330    Alcohol Level     Component Value Date/Time   ETH <10 07/31/2019 1938    IMAGING  CT Code Stroke CTA Head W/WO contrast CT Code  Stroke CTA Neck W/WO contrast 07/31/2019 IMPRESSION:  Mild atherosclerotic disease at the carotid bifurcation without significant carotid stenosis in the neck. Left vertebral artery widely patent Right vertebral artery is a small vessel with diffuse disease and occlusion at C1 No intracranial large vessel occlusion or flow limiting stenosis.   CT HEAD CODE STROKE WO CONTRAST 07/31/2019 IMPRESSION:  1. Right frontal hypodensity most likely a small chronic infarct. Negative for hemorrhage  2. ASPECTS is 10   MRI WO Contrast - pending  Transthoracic Echocardiogram  00/00/2021 Pending  ECG - SR rate 84 BPM. (See cardiology reading for complete details)   PHYSICAL EXAM  Temp:  [97.2 F (36.2 C)-98.7 F (37.1 C)] 97.2 F (36.2 C) (05/01 0800) Pulse Rate:  [62-92] 81 (05/01 0900) Resp:  [9-27] 13 (05/01 0900) BP: (73-196)/(53-114) 161/98 (05/01 0800) SpO2:  [94 %-100 %] 98 % (05/01 0900) Weight:  [109.1 kg] 109.1 kg (04/30 2300)  General - Well nourished, well developed, in no apparent distress.  Ophthalmologic -  fundi not visualized due to noncooperation.  Cardiovascular - Regular rhythm and rate.  Mental Status -  Level of arousal and orientation to time, place, and person were intact. Language including expression, naming, repetition, comprehension was assessed and found intact. Fund of Knowledge was assessed and was intact.  Cranial Nerves II - XII - II - Visual field intact OU. III, IV, VI - Extraocular movements intact. V - Facial sensation intact bilaterally. VII - Facial movement intact bilaterally. VIII - Hearing & vestibular intact bilaterally. X - Palate elevates symmetrically. XI - Chin turning & shoulder shrug intact bilaterally. XII - Tongue protrusion intact.  Motor Strength - The patient's strength was normal in all extremities and pronator drift was absent.  Bulk was normal and fasciculations were absent.   Motor Tone - Muscle tone was assessed at the  neck and appendages and was normal.  Reflexes - The patient's reflexes were symmetrical in all extremities and he had no pathological reflexes.  Sensory - Light touch, temperature/pinprick were assessed and were symmetrical.    Coordination - The patient had normal movements in the hands and feet with no ataxia or dysmetria.  Tremor was absent.  Gait and Station - deferred.   ASSESSMENT/PLAN Mr. Sara F San [place]ino is a 59 y.o. male with history of a previous stroke, DM, CAD with previous MI, hx of substance abuse and hyperlipidemia, who presents with acute dizziness, "head buzzing",  and gait instability which started abruptly shortly after 6 PM.  The patient received IV t-PA Friday 07/31/19 at 21:15  Stroke:  likely cerebellar infarct - MRI pending  Resultant symptoms resolved  Code Stroke CT Head - Right frontal hypodensity most likely a small chronic infarct. Negative for hemorrhage. ASPECTS is 10   CTA H&N - Mild atherosclerotic disease at the carotid bifurcation without significant carotid stenosis in the neck. 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 No intracranial large vessel occlusion or flow limiting stenosis.  MRI head - pending  2D Echo - pending  Given his recurrent strokes on DAPT, will recommend TEE and loop recorder  [employer] Virus 2 - negative  LDL - 75  HgbA1c - 8.7  UDS - neg  VTE prophylaxis - SCDs  aspirin 81 mg daily and clopidogrel 75 mg daily prior to admission, now on No antithrombotic within 24h of tPA  Patient will be counseled to be compliant with his antithrombotic medications  Ongoing aggressive stroke risk factor management  Therapy recommendations:  pending  Disposition:  Pending  Hx of stroke  02/2015 - HA, left arm weakness and difficulty walking, MRI right motor strip small infarct, EF 60-65%. MRA head and neck neg but  right VA  Hypoplastic and occluded at C1. A1C 7.2 and LDL 57. As per pt, he was on DAPT since then. He was also put on lipitor 80. Recommended TEE and loop at that time but not done.   Hypertension  Home BP meds: Zestril, Toprol  Off Cleviprex now . BP goal < 180/105 after tPA . Resume home BP meds . Long term BP goal normotensive  Hyperlipidemia  Home Lipid lowering medication: Lipitor 80 mg daily  LDL 75, goal < 70  Current lipid lowering medication: lipitor 80  Continue statin at discharge  Diabetes  Home diabetic meds: Januvia, Amaryl, metformin  Current diabetic meds: SSI   HgbA1c 8.7, goal < 7.0  CBG  monitoring  Close PCP follow up  Other Stroke Risk Factors  Obesity, Body mass index is 32.62 kg/m., recommend weight loss, diet and exercise as appropriate   Coronary artery disease  Other Active Problems  Code status - Full code  Hx of substance abuse - UDS neg  Hospital day # 1  This patient is critically ill due to recurrent stroke, s/p tPA, hyperglycemia and at significant risk of neurological worsening, death form recurrent stroke, hemorrhagic conversion, bleeding, seizure, DKA. This patient's care requires constant monitoring of vital signs, hemodynamics, respiratory and cardiac monitoring, review of multiple databases, neurological assessment, discussion with family, other specialists and medical decision making of high complexity. I spent 35 minutes of neurocritical care time in the care of this patient. I had long discussion with pt and wife at bedside, updated pt current condition, treatment plan and potential prognosis, and answered all the questions. They expressed understanding and appreciation.    Rosalin Hawking, MD PhD Stroke Neurology 08/01/2019 9:37 AM    To contact Stroke Continuity provider, please refer to [email or web address] After hours, contact General Neurology

## 2019-08-01 NOTE — Procedures (Signed)
Echo attempted now that patient transferred to 3W. Patient eating. Will attempt again.

## 2019-08-01 NOTE — Evaluation (Signed)
Physical Therapy Evaluation Patient Details Name: Karl Morgan [place] MRN: [id number] DOB: November 30, 1960 Today's Date: 08/01/2019   History of Present Illness  59 y.o. male with history of a previous stroke, DM, CAD with previous MI, hx of substance abuse and hyperlipidemia, who presents with acute dizziness, "head buzzing",  and gait instability. TPA recieved  Clinical Impression  Patient seen for therapy assessment.  Mobilizing well with no noted focal deficits at this time. Educated patient on BEFAST signs. No further acute PT needs. Will sign off.     Follow Up Recommendations No PT follow up    Equipment Recommendations  None recommended by PT    Recommendations for Other Services       Precautions / Restrictions Precautions Precautions: Fall Restrictions Weight Bearing Restrictions: No      Mobility  Bed Mobility Overal bed mobility: Modified Independent                Transfers Overall transfer level: Independent               General transfer comment: no physical assist required  Ambulation/Gait Ambulation/Gait assistance: Supervision Gait Distance (Feet): 350 Feet Assistive device: None       General Gait Details: mild coordination deficit during gait with RLE  Stairs            Wheelchair Mobility    Modified Rankin (Stroke Patients Only) Modified Rankin (Stroke Patients Only) Pre-Morbid Rankin Score: No symptoms Modified Rankin: No symptoms     Balance Overall balance assessment: Modified Independent                               Standardized Balance Assessment Standardized Balance Assessment : Dynamic Gait Index   Dynamic Gait Index Level Surface: Normal Change in Gait Speed: Normal Gait with Horizontal Head Turns: Normal Gait with Vertical Head Turns: Normal Gait and Pivot Turn: Normal Step Over Obstacle: Mild Impairment Step Around Obstacles: Normal Steps: Mild Impairment Total Score: 22       Pertinent  Vitals/Pain Pain Assessment: No/denies pain    Home Living Family/patient expects to be discharged to:: Private residence Living Arrangements: Spouse/significant other Available Help at Discharge: Family Type of Home: House Home Access: Stairs to enter Entrance Stairs-Rails: None Technical brewer of Steps: 1   Home Equipment: None      Prior Function Level of Independence: Independent         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, contracting)     Journalist, newspaper   Dominant Hand: Right    Extremity/Trunk Assessment   Upper Extremity Assessment Upper Extremity Assessment: Overall WFL for tasks assessed    Lower Extremity Assessment Lower Extremity Assessment: RLE deficits/detail RLE Deficits / Details: mild assymetry noted RLE during functional task performance       Communication   Communication: No difficulties  Cognition Arousal/Alertness: Awake/alert Behavior During Therapy: WFL for tasks assessed/performed Overall Cognitive Status: Within Functional Limits for tasks assessed                                        General Comments      Exercises     Assessment/Plan    PT Assessment Patent does not need any further PT services  PT Problem List         PT Treatment Interventions  PT Goals (Current goals can be found in the Care Plan section)  Acute Rehab PT Goals PT Goal Formulation: All assessment and education complete, DC therapy    Frequency     Barriers to discharge        Co-evaluation               AM-PAC PT "6 Clicks" Mobility  Outcome Measure Help needed turning from your back to your side while in a flat bed without using bedrails?: None Help needed moving from lying on your back to sitting on the side of a flat bed without using bedrails?: None Help needed moving to and from a bed to a chair (including a wheelchair)?: None Help needed standing up from a chair using your arms (e.g., wheelchair or bedside chair)?:  A Little Help needed to walk in hospital room?: A Little Help needed climbing 3-5 steps with a railing? : A Little 6 Click Score: 21    End of Session   Activity Tolerance: Patient tolerated treatment well Patient left: in bed;with call bell/phone within reach Nurse Communication: Mobility status PT Visit Diagnosis: Other symptoms and signs involving the nervous system (R29.898)    Time: [id number] PT Time Calculation (min) (ACUTE ONLY): 17 min   Charges:   PT Evaluation $PT Eval Low Complexity: [place], PT DPT  Board Certified Neurologic Specialist Acute Rehabilitation Services Pager [phone] Office [place] 08/01/2019, 11:11 AM

## 2019-08-01 NOTE — Progress Notes (Signed)
Pt transferred to 3W-28. Wife at bedside

## 2019-08-02 ENCOUNTER — Inpatient Hospital Stay (HOSPITAL_COMMUNITY): Payer: 59

## 2019-08-02 DIAGNOSIS — E1165 Type 2 diabetes mellitus with hyperglycemia: Secondary | ICD-10-CM

## 2019-08-02 DIAGNOSIS — I63441 Cerebral infarction due to embolism of right cerebellar artery: Principal | ICD-10-CM

## 2019-08-02 LAB — CBC
HCT: 45.3 % (ref 39.0–52.0)
Hemoglobin: 15.5 g/dL (ref 13.0–17.0)
MCH: 32 pg (ref 26.0–34.0)
MCHC: 34.2 g/dL (ref 30.0–36.0)
MCV: 93.6 fL (ref 80.0–100.0)
Platelets: 139 10*3/uL — ABNORMAL LOW (ref 150–400)
RBC: 4.84 MIL/uL (ref 4.22–5.81)
RDW: 12.1 % (ref 11.5–15.5)
WBC: 10.1 10*3/uL (ref 4.0–10.5)
nRBC: 0 % (ref 0.0–0.2)

## 2019-08-02 LAB — GLUCOSE, CAPILLARY
Glucose-Capillary: 178 mg/dL — ABNORMAL HIGH (ref 70–99)
Glucose-Capillary: 198 mg/dL — ABNORMAL HIGH (ref 70–99)
Glucose-Capillary: 297 mg/dL — ABNORMAL HIGH (ref 70–99)
Glucose-Capillary: 311 mg/dL — ABNORMAL HIGH (ref 70–99)

## 2019-08-02 LAB — BASIC METABOLIC PANEL
Anion gap: 9 (ref 5–15)
BUN: 18 mg/dL (ref 6–20)
CO2: 23 mmol/L (ref 22–32)
Calcium: 8.9 mg/dL (ref 8.9–10.3)
Chloride: 106 mmol/L (ref 98–111)
Creatinine, Ser: 1.45 mg/dL — ABNORMAL HIGH (ref 0.61–1.24)
GFR calc Af Amer: >60 mL/min (ref >60)
GFR calc non Af Amer: 52 mL/min — ABNORMAL LOW (ref >60)
Glucose, Bld: 203 mg/dL — ABNORMAL HIGH (ref 70–99)
Potassium: 3.9 mmol/L (ref 3.5–5.1)
Sodium: 138 mmol/L (ref 135–145)

## 2019-08-02 MED ORDER — STROKE: EARLY STAGES OF RECOVERY BOOK
Status: AC
  Filled 2019-08-02: qty 1

## 2019-08-02 MED ORDER — LIVING WELL WITH DIABETES BOOK
Freq: Once | Status: AC
  Filled 2019-08-02: qty 1

## 2019-08-02 NOTE — Progress Notes (Addendum)
STROKE TEAM PROGRESS NOTE   INTERVAL HISTORY Wife at bedside. Pt sitting in bed, no complains, no HA or dizziness. No acute event overnight. MRI showed right SCA infarct with hemorrhagic conversion. CT repeat this am showed right SCA infarct with small central hemorrhage within the infarct.  Discussed again about TEE and loop recorder, patient is in agreement.   OBJECTIVE Vitals:   08/02/19 0025 08/02/19 0530 08/02/19 0822 08/02/19 1203  BP: 106/66 103/74 125/81 127/90  Pulse: 73 73 61 65  Resp: 16 18 18 18   Temp: 98.4 F (36.9 C) 98.2 F (36.8 C) 97.8 F (36.6 C) 98.3 F (36.8 C)  TempSrc: Oral Oral Oral Oral  SpO2: 94% 95% 98% 99%  Weight:      Height:        CBC:  Recent Labs  Lab 07/31/19 1938 07/31/19 1938 08/01/19 0235 08/02/19 0326  WBC 8.8   < > 11.9* 10.1  NEUTROABS 6.2  --   --   --   HGB 15.5  15.0   < > 16.1 15.5  HCT 45.2  44.0   < > 45.3 45.3  MCV 93.2   < > 92.1 93.6  PLT 133*   < > 145* 139*   < > = values in this interval not displayed.    Basic Metabolic Panel:  Recent Labs  Lab 08/01/19 0235 08/02/19 0326  NA 136 138  K 4.2 3.9  CL 103 106  CO2 24 23  GLUCOSE 235* 203*  BUN 13 18  CREATININE 1.20 1.45*  CALCIUM 9.3 8.9    Lipid Panel:     Component Value Date/Time   CHOL 130 08/01/2019 0235   CHOL 109 09/22/2013 0908   TRIG 109 08/01/2019 0235   TRIG 99 09/22/2013 0908   HDL 33 (L) 08/01/2019 0235   HDL 25 (L) 09/22/2013 0908   CHOLHDL 3.9 08/01/2019 0235   VLDL 22 08/01/2019 0235   VLDL 20 09/22/2013 0908   LDLCALC 75 08/01/2019 0235   LDLCALC 71 08/22/2017 1005   LDLCALC 64 09/22/2013 0908   HgbA1c:  Lab Results  Component Value Date   HGBA1C 8.1 (H) 08/01/2019   Urine Drug Screen:     Component Value Date/Time   LABOPIA NONE DETECTED 07/31/2019 2330   COCAINSCRNUR NONE DETECTED 07/31/2019 2330   LABBENZ NONE DETECTED 07/31/2019 2330   AMPHETMU NONE DETECTED 07/31/2019 2330   THCU NONE DETECTED 07/31/2019 2330    LABBARB NONE DETECTED 07/31/2019 2330    Alcohol Level     Component Value Date/Time   ETH <10 07/31/2019 1938    IMAGING  CT Code Stroke CTA Head W/WO contrast CT Code Stroke CTA Neck W/WO contrast 07/31/2019 IMPRESSION:  Mild atherosclerotic disease at the carotid bifurcation without significant carotid stenosis in the neck. Left vertebral artery widely patent Right vertebral artery is a small vessel with diffuse disease and occlusion at C1 No intracranial large vessel occlusion or flow limiting stenosis.   CT HEAD CODE STROKE WO CONTRAST 07/31/2019 IMPRESSION:  1. Right frontal hypodensity most likely a small chronic infarct. Negative for hemorrhage  2. ASPECTS is 10   MRI WO Contrast 08/01/2019 IMPRESSION: Acute infarct right superior cerebellum. Superimposed hemorrhage is present within the infarct. Hemorrhage and infarct not seen on the CT yesterday. Mild-to-moderate amount of hemorrhage. Mild local mass-effect without hydrocephalus.  CT Head WO Contrast 08/02/2019  IMPRESSION: Unchanged small area of central hemorrhage within the right cerebellar infarct. No new site of hemorrhage or  ischemia.  Transthoracic Echocardiogram  08/01/2019 IMPRESSIONS  1. Left ventricular ejection fraction, by estimation, is 65 to 70%. The  left ventricle has hyperdynamic function. The left ventricle has no  regional wall motion abnormalities. There is mild concentric left  ventricular hypertrophy. Left ventricular  diastolic parameters are consistent with Grade I diastolic dysfunction  (impaired relaxation). Elevated left atrial pressure.  2. Right ventricular systolic function is normal. The right ventricular  size is normal.  3. Right atrial size was mildly dilated.  4. The mitral valve is normal in structure. Mild mitral valve  regurgitation. No evidence of mitral stenosis.  5. The aortic valve is normal in structure. Aortic valve regurgitation is  not visualized. No aortic stenosis  is present.  6. The inferior vena cava is normal in size with greater than 50%  respiratory variability, suggesting right atrial pressure of 3 mmHg.  Conclusion(s)/Recommendation(s):   No intracardiac source of embolism detected on this transthoracic study.  A transesophageal echocardiogram is  recommended to exclude cardiac source of embolism if clinically indicated.   ECG - SR rate 84 BPM. (See cardiology reading for complete details)   PHYSICAL EXAM   Temp:  [97.6 F (36.4 C)-98.4 F (36.9 C)] 98.3 F (36.8 C) (05/02 1203) Pulse Rate:  [61-97] 65 (05/02 1203) Resp:  [16-18] 18 (05/02 1203) BP: (103-152)/(66-98) 127/90 (05/02 1203) SpO2:  [94 %-99 %] 99 % (05/02 1203)  General - Well nourished, well developed, in no apparent distress.  Ophthalmologic - fundi not visualized due to noncooperation.  Cardiovascular - Regular rhythm and rate.  Mental Status -  Level of arousal and orientation to time, place, and person were intact. Language including expression, naming, repetition, comprehension was assessed and found intact. Fund of Knowledge was assessed and was intact.  Cranial Nerves II - XII - II - Visual field intact OU. III, IV, VI - Extraocular movements intact. V - Facial sensation intact bilaterally. VII - Facial movement intact bilaterally. VIII - Hearing & vestibular intact bilaterally. X - Palate elevates symmetrically. XI - Chin turning & shoulder shrug intact bilaterally. XII - Tongue protrusion intact.  Motor Strength - The patient's strength was normal in all extremities and pronator drift was absent.  Bulk was normal and fasciculations were absent.   Motor Tone - Muscle tone was assessed at the neck and appendages and was normal.  Reflexes - The patient's reflexes were symmetrical in all extremities and he had no pathological reflexes.  Sensory - Light touch, temperature/pinprick were assessed and were symmetrical.    Coordination - The patient had  normal movements in the hands and feet with no ataxia or dysmetria.  Tremor was absent.  Gait and Station - deferred.   ASSESSMENT/PLAN Mr. Palmer F [place] is a 59 y.o. male with history of a previous stroke, DM, CAD with previous MI, hx of substance abuse and hyperlipidemia, who presents with acute dizziness, "head buzzing",  and gait instability which started abruptly shortly after 6 PM.  The patient received IV t-PA Friday 07/31/19 at 21:15  Stroke: Acute infarct right superior cerebellum. Superimposed hemorrhage is present within the infarct.   Resultant symptoms resolved  Code Stroke CT Head - Right frontal hypodensity most likely a small chronic infarct. Negative for hemorrhage. ASPECTS is 10   CTA H&N - Mild atherosclerotic disease at the carotid bifurcation without significant carotid stenosis in the neck. Left vertebral artery widely patent Right vertebral artery is a small vessel with diffuse disease and occlusion at C1  which seems to be chronic from 2006. No intracranial large vessel occlusion or flow limiting stenosis.  MRI head - 08/01/19 - Acute infarct right superior cerebellum. Superimposed hemorrhage is present within the infarct. Mild local mass-effect without hydrocephalus.  CT Head - 08/02/19 - Unchanged small area of central hemorrhage within the right cerebellar infarct. No new site of hemorrhage or ischemia  2D Echo - EF 65 - 70%. No cardiac source of emboli identified.   Given his recurrent strokes on DAPT, will recommend TEE and loop recorder on Monday  [facility] Virus 2 - negative  LDL - 75  HgbA1c - 8.7  UDS - neg  VTE prophylaxis - SCDs  aspirin 81 mg daily and clopidogrel 75 mg daily prior to admission, now on No antithrombotic given small hemorrhagic conversion. Will resume DAPT on discharge for 3 weeks and then plavix alone.  Patient will be counseled to be compliant with his antithrombotic medications  Ongoing aggressive stroke risk factor  management  Therapy recommendations:  None  Disposition:  Pending  Hx of stroke  02/2015 - HA, left arm weakness and difficulty walking, MRI right motor strip small infarct, EF 60-65%. 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 A1C 7.2 and LDL 57. As per pt, he was on DAPT since then. He was also put on lipitor 80. Recommended TEE and loop at that time but not done.   Hypertension  Home BP meds: Zestril, Toprol  Off Cleviprex now . BP goal < 180/105 after tPA . Resume home BP meds . Long term BP goal normotensive  Hyperlipidemia  Home Lipid lowering medication: Lipitor 80 mg daily  LDL 75, goal < 70  Current lipid lowering medication: lipitor 80  Continue statin at discharge  Diabetes  Home diabetic meds: Januvia, Amaryl, metformin  Current diabetic meds: SSI   HgbA1c 8.7, goal < 7.0  CBG monitoring  Close PCP follow up  Other Stroke Risk Factors  Obesity, Body mass index is 32.62 kg/m., recommend weight loss, diet and exercise as appropriate   Coronary artery disease  Other Active Problems  Code status - Full code  Hx of substance abuse - UDS neg  CKD 3a - Creatinine 1.34->1.20->1.45  Hospital day # 2   Rosalin Hawking, MD PhD Stroke Neurology 08/02/2019 4:47 PM   To contact Stroke Continuity provider, please refer to [email or web address] After hours, contact General Neurology

## 2019-08-02 NOTE — Evaluation (Signed)
Speech Language Pathology Evaluation Patient Details Name: Karl Morgan MRN: [id number] DOB: 1961/01/21 Today's Date: 08/02/2019 Time: [id number] SLP Time Calculation (min) (ACUTE ONLY): 15 min  Problem List:  Patient Active Problem List   Diagnosis Date Noted  . Stroke (cerebrum) ([place]) 07/31/2019  . Arthritis 10/21/2018  . Sinusitis 10/21/2018  . Diabetes mellitus type 2 in obese ([place]) 08/22/2017  . Obesity (BMI 30.0-34.9) 08/22/2017  . Herpes simplex type 1 infection 02/21/2015  . Recurrent oral herpes simplex infection 02/21/2015  . History of stroke 02/16/2015  . Arteriosclerosis of coronary artery 10/07/2014  . Heart attack ([place]) 10/07/2014  . Hypotestosteronism 10/07/2014  . B12 deficiency 12/18/2011  . Hypothyroidism 05/04/2011  . Annual physical exam 04/05/2011  . Diabetes mellitus type 2, controlled, with complications ([place]) 0000000  . Essential hypertension 04/04/2011  . Hyperlipidemia 04/04/2011  . Hypogonadism male 04/04/2011  . Alopecia 04/04/2011  . Erectile dysfunction 04/04/2011   Past Medical History:  Past Medical History:  Diagnosis Date  . Alopecia 04/04/2011  . Diabetes mellitus   . Erectile dysfunction 04/04/2011  . Heart murmur   . Hyperlipidemia 04/04/2011  . Hypogonadism male 04/04/2011  . Myocardial infarction ([place])   . Stroke ([place])   . Substance abuse ([place])   . Type II or unspecified type diabetes mellitus without mention of complication, uncontrolled 04/04/2011  . Unspecified essential hypertension 04/04/2011   Past Surgical History:  Past Surgical History:  Procedure Laterality Date  . CARDIAC CATHETERIZATION     HPI:  Pt is a 59 y.o. male with a history of previous stroke, DM, hyperlipidemia, substance abuse, who presents with acute dizziness, "head buzzing",  gait instability and dizziness that started abruptly.  tPA given. CT head 4/30: Right frontal hypodensity most likely a small chronic infarct. MRi 5/1: Acute infarct right superior  cerebellum. Superimposed hemorrhage is present within the infarct.    Assessment / Plan / Recommendation Clinical Impression  Pt participated in speech/language/cognition evaluation with his wife present. Both parties denied the pt having any baseline or acute deficits in the evaluated areas. The [facility] Mental Status Examination was completed to evaluate the pt's cognitive-linguistic skills. He achieved a score of 29/30 which is within the normal limits of 27 or more out of 30 and no language deficits were demonstrated. Articulatory precision was mildly reduced but speech was intelligible. Further skilled SLP services are not clinically indicated at this time. Pt, his wife, and nursing were educated regarding this and all parties verbalized understanding as well as agreement with plan of care.'    SLP Assessment  SLP Recommendation/Assessment: Patient does not need any further Speech Lanaguage Pathology Services SLP Visit Diagnosis: Cognitive communication deficit (R41.841)    Follow Up Recommendations  None    Frequency and Duration           SLP Evaluation Cognition  Overall Cognitive Status: Within Functional Limits for tasks assessed Arousal/Alertness: Awake/alert Orientation Level: Oriented X4 Attention: Focused;Sustained Focused Attention: Appears intact Sustained Attention: Appears intact Memory: Impaired Memory Impairment: Storage deficit;Retrieval deficit(Immediate: 5/5; delayed: 4/5; with cue: 1/1) Awareness: Appears intact Problem Solving: Appears intact Executive Function: Reasoning;Sequencing;Organizing Reasoning: Appears intact Sequencing: Appears intact Organizing: Appears intact       Comprehension  Auditory Comprehension Overall Auditory Comprehension: Appears within functional limits for tasks assessed Yes/No Questions: Within Functional Limits Commands: Within Functional Limits Conversation: Complex Visual  Recognition/Discrimination Discrimination: Within Function Limits    Expression Expression Primary Mode of Expression: Verbal Verbal Expression Overall Verbal Expression:  Appears within functional limits for tasks assessed Initiation: No impairment Automatic Speech: Counting;Day of week;Month of year Level of Generative/Spontaneous Verbalization: Conversation Repetition: No impairment Naming: No impairment Pragmatics: No impairment   Oral / Motor  Oral Motor/Sensory Function Overall Oral Motor/Sensory Function: Mild impairment Facial ROM: Reduced right;Suspected CN VII (facial) dysfunction Facial Symmetry: Abnormal symmetry right;Suspected CN VII (facial) dysfunction Facial Strength: Reduced right;Suspected CN VII (facial) dysfunction Facial Sensation: Within Functional Limits Lingual ROM: Within Functional Limits Lingual Symmetry: Within Functional Limits Lingual Strength: Within Functional Limits Lingual Sensation: Within Functional Limits Motor Speech Overall Motor Speech: Appears within functional limits for tasks assessed Respiration: Within functional limits Phonation: Normal Resonance: Within functional limits Articulation: Impaired Level of Impairment: Conversation Intelligibility: Intelligible Motor Planning: Witnin functional limits Motor Speech Errors: Not applicable   Jahnessa Vanduyn I. Hardin Negus, [place], [place] Office number [phone] Pager [phone]                    Karl Morgan 08/02/2019, 2:29 PM

## 2019-08-03 ENCOUNTER — Encounter (HOSPITAL_COMMUNITY)
Admission: EM | Disposition: A | Discharge status: Home / Self Care | Payer: Self-pay | Source: Home / Self Care | Attending: Neurology

## 2019-08-03 ENCOUNTER — Inpatient Hospital Stay (HOSPITAL_COMMUNITY): Payer: 59 | Admitting: Certified Registered Nurse Anesthetist

## 2019-08-03 ENCOUNTER — Inpatient Hospital Stay (HOSPITAL_COMMUNITY): Payer: 59

## 2019-08-03 ENCOUNTER — Other Ambulatory Visit: Payer: Self-pay

## 2019-08-03 ENCOUNTER — Encounter (HOSPITAL_COMMUNITY): Payer: Self-pay | Admitting: Neurology

## 2019-08-03 DIAGNOSIS — E669 Obesity, unspecified: Secondary | ICD-10-CM

## 2019-08-03 DIAGNOSIS — I6389 Other cerebral infarction: Secondary | ICD-10-CM

## 2019-08-03 DIAGNOSIS — N183 Chronic kidney disease, stage 3 unspecified: Secondary | ICD-10-CM | POA: Diagnosis present

## 2019-08-03 DIAGNOSIS — N1831 Chronic kidney disease, stage 3a: Secondary | ICD-10-CM

## 2019-08-03 DIAGNOSIS — E118 Type 2 diabetes mellitus with unspecified complications: Secondary | ICD-10-CM

## 2019-08-03 HISTORY — PX: LOOP RECORDER INSERTION: EP1214

## 2019-08-03 HISTORY — PX: TEE WITHOUT CARDIOVERSION: SHX5443

## 2019-08-03 HISTORY — PX: BUBBLE STUDY: SHX6837

## 2019-08-03 LAB — BASIC METABOLIC PANEL
Anion gap: 9 (ref 5–15)
BUN: 18 mg/dL (ref 6–20)
CO2: 21 mmol/L — ABNORMAL LOW (ref 22–32)
Calcium: 8.6 mg/dL — ABNORMAL LOW (ref 8.9–10.3)
Chloride: 107 mmol/L (ref 98–111)
Creatinine, Ser: 1.2 mg/dL (ref 0.61–1.24)
GFR calc Af Amer: >60 mL/min (ref >60)
GFR calc non Af Amer: >60 mL/min (ref >60)
Glucose, Bld: 200 mg/dL — ABNORMAL HIGH (ref 70–99)
Potassium: 4.4 mmol/L (ref 3.5–5.1)
Sodium: 137 mmol/L (ref 135–145)

## 2019-08-03 LAB — CBC
HCT: 44.9 % (ref 39.0–52.0)
Hemoglobin: 15.8 g/dL (ref 13.0–17.0)
MCH: 32.9 pg (ref 26.0–34.0)
MCHC: 35.2 g/dL (ref 30.0–36.0)
MCV: 93.5 fL (ref 80.0–100.0)
Platelets: 141 10*3/uL — ABNORMAL LOW (ref 150–400)
RBC: 4.8 MIL/uL (ref 4.22–5.81)
RDW: 12 % (ref 11.5–15.5)
WBC: 8.9 10*3/uL (ref 4.0–10.5)
nRBC: 0 % (ref 0.0–0.2)

## 2019-08-03 LAB — GLUCOSE, CAPILLARY: Glucose-Capillary: 189 mg/dL — ABNORMAL HIGH (ref 70–99)

## 2019-08-03 SURGERY — ECHOCARDIOGRAM, TRANSESOPHAGEAL
Anesthesia: Monitor Anesthesia Care

## 2019-08-03 SURGERY — LOOP RECORDER INSERTION

## 2019-08-03 MED ORDER — LIDOCAINE-EPINEPHRINE 1 %-1:100000 IJ SOLN
INTRAMUSCULAR | Status: AC
  Filled 2019-08-03: qty 2

## 2019-08-03 MED ORDER — CLOPIDOGREL BISULFATE 75 MG PO TABS: 75.0000 mg | ORAL_TABLET | Freq: Every day | ORAL | Status: DC

## 2019-08-03 MED ORDER — VALACYCLOVIR HCL 1 G PO TABS: 1000.0000 mg | ORAL_TABLET | ORAL | Status: DC

## 2019-08-03 MED ORDER — LIDOCAINE-EPINEPHRINE 1 %-1:100000 IJ SOLN
INTRAMUSCULAR | Status: DC | PRN
  Administered 2019-08-03: 20 mL

## 2019-08-03 MED ORDER — LACTATED RINGERS IV SOLN: INTRAVENOUS | Status: DC

## 2019-08-03 MED ORDER — PROPOFOL 500 MG/50ML IV EMUL
INTRAVENOUS | Status: DC | PRN
  Administered 2019-08-03: 150 ug/kg/min via INTRAVENOUS

## 2019-08-03 MED ORDER — LIDOCAINE HCL (CARDIAC) PF 100 MG/5ML IV SOSY
PREFILLED_SYRINGE | INTRAVENOUS | Status: DC | PRN
  Administered 2019-08-03: 40 mg via INTRAVENOUS

## 2019-08-03 MED ORDER — PROPOFOL 10 MG/ML IV BOLUS
INTRAVENOUS | Status: DC | PRN
  Administered 2019-08-03: 40 mg via INTRAVENOUS
  Administered 2019-08-03: 30 mg via INTRAVENOUS

## 2019-08-03 MED ORDER — ASPIRIN EC 81 MG PO TBEC: 81.0000 mg | DELAYED_RELEASE_TABLET | Freq: Every day | ORAL | 0 refills | Status: DC

## 2019-08-03 MED ORDER — PHENYLEPHRINE HCL (PRESSORS) 10 MG/ML IV SOLN
INTRAVENOUS | Status: DC | PRN
  Administered 2019-08-03 (×2): 120 ug via INTRAVENOUS

## 2019-08-03 SURGICAL SUPPLY — 2 items
MONITOR REVEAL LINQ II (Prosthesis & Implant Heart) ×1 IMPLANT
PACK LOOP INSERTION (CUSTOM PROCEDURE TRAY) ×2 IMPLANT

## 2019-08-03 NOTE — Social Work (Signed)
CSW met with pt at bedside. CSW introduced self and explained her role. CSW completed sbirt with pt.  Pt scored a 0 on the sbirt scale. Pt denied alcohol use. Pt denied substance use. Pt did not need resources at this time.  Emeterio Reeve, Latanya Presser, [place] Social Worker [phone]

## 2019-08-03 NOTE — Progress Notes (Signed)
Inpatient Diabetes Program Recommendations  AACE/ADA: New Consensus Statement on Inpatient Glycemic Control (2015)  Target Ranges:  Prepandial:   less than 140 mg/dL      Peak postprandial:   less than 180 mg/dL (1-2 hours)      Critically ill patients:  140 - 180 mg/dL   Lab Results  Component Value Date   GLUCAP 189 (H) 08/03/2019   HGBA1C 8.1 (H) 08/01/2019    Review of Glycemic Control Results for Karl Morgan, Karl Morgan (MRN [id number]) as of 08/03/2019 09:08  Ref. Range 08/02/2019 06:18 08/02/2019 12:06 08/02/2019 16:19 08/02/2019 20:00 08/03/2019 06:05  Glucose-Capillary Latest Ref Range: 70 - 99 mg/dL 178 (H) 198 (H) 297 (H) 311 (H) 189 (H)   Diabetes history: DM 2 Outpatient Diabetes medications: Amaryl 2 mg Daily, Metformin 850 mg Daily, Januvia 100 mg Daily Current orders for Inpatient glycemic control:  Novolog moderate 0-15 units tid  A1c 8.1% this admission  Inpatient Diabetes Program Recommendations:    Glucose trends increase after meal intake into the 300 range at the end of the day.  Pt may benefit from adding Novolog 4 units tid meal coverage if eating >50% of meals.  Thanks,  Tama Headings RN, MSN, BC-ADM Inpatient Diabetes Coordinator Team Pager [phone] (8a-5p)

## 2019-08-03 NOTE — Anesthesia Postprocedure Evaluation (Signed)
Anesthesia Post Note  Patient: Karl Morgan  Procedure(s) Performed: TRANSESOPHAGEAL ECHOCARDIOGRAM (TEE) (N/A ) BUBBLE STUDY     Patient location during evaluation: PACU Anesthesia Type: MAC Level of consciousness: awake and alert Pain management: pain level controlled Vital Signs Assessment: post-procedure vital signs reviewed and stable Respiratory status: spontaneous breathing, nonlabored ventilation, respiratory function stable and patient connected to nasal cannula oxygen Cardiovascular status: stable and blood pressure returned to baseline Postop Assessment: no apparent nausea or vomiting Anesthetic complications: no    Last Vitals:  Vitals:   08/03/19 1350 08/03/19 1413  BP: (!) 92/56 104/67  Pulse: 72 (!) 58  Resp: 19 19  Temp:    SpO2: 97% 98%    Last Pain:  Vitals:   08/03/19 1413  TempSrc:   PainSc: 0-No pain                 Effie Berkshire

## 2019-08-03 NOTE — Interval H&P Note (Signed)
History and Physical Interval Note:  08/03/2019 11:28 AM  Karl Morgan  has presented today for surgery, with the diagnosis of Stroke.  The various methods of treatment have been discussed with the patient and family. After consideration of risks, benefits and other options for treatment, the patient has consented to  Procedure(s): TRANSESOPHAGEAL ECHOCARDIOGRAM (TEE) (N/A) as a surgical intervention.  The patient's history has been reviewed, patient examined, no change in status, stable for surgery.  I have reviewed the patient's chart and labs.  Questions were answered to the patient's satisfaction.     Dorris Carnes

## 2019-08-03 NOTE — CV Procedure (Signed)
TEE  Patient sedated by anesthesia with Propofol intravenously Cetacaine used to numb the back of mouth  TEE proble advanced to mid esophagus without difficulty     LA, LAA without masses TVnormal PV normal MV normal AV normal  No PFO by color doppler or with injection of agitated saline\\  Thoracic aorta with mild atherosclerotic plaquing   LV with normal to mildly reduced function     Dorris Carnes, MD

## 2019-08-03 NOTE — Anesthesia Preprocedure Evaluation (Signed)
Anesthesia Evaluation  Patient identified by MRN, date of birth, ID band Patient awake    Reviewed: Allergy & Precautions, NPO status , Patient's Chart, lab work & pertinent test results  Airway Mallampati: I  TM Distance: >3 FB Neck ROM: Full    Dental no notable dental hx.    Pulmonary neg pulmonary ROS,    Pulmonary exam normal        Cardiovascular hypertension, + CAD  Normal cardiovascular exam     Neuro/Psych CVA negative psych ROS   GI/Hepatic GERD  Medicated,(+)     substance abuse  ,   Endo/Other  diabetes, Type 2, Oral Hypoglycemic AgentsHypothyroidism   Renal/GU Renal InsufficiencyRenal disease     Musculoskeletal   Abdominal Normal abdominal exam  (+)   Peds  Hematology   Anesthesia Other Findings   Reproductive/Obstetrics                             Echo:  1. Left ventricular ejection fraction, by estimation, is 65 to 70%. The  left ventricle has hyperdynamic function. The left ventricle has no  regional wall motion abnormalities. There is mild concentric left  ventricular hypertrophy. Left ventricular  diastolic parameters are consistent with Grade I diastolic dysfunction  (impaired relaxation). Elevated left atrial pressure.  2. Right ventricular systolic function is normal. The right ventricular  size is normal.  3. Right atrial size was mildly dilated.  4. The mitral valve is normal in structure. Mild mitral valve  regurgitation. No evidence of mitral stenosis.  5. The aortic valve is normal in structure. Aortic valve regurgitation is  not visualized. No aortic stenosis is present.  6. The inferior vena cava is normal in size with greater than 50%  respiratory variability, suggesting right atrial pressure of 3 mmHg.   Anesthesia Physical Anesthesia Plan  ASA: II  Anesthesia Plan: MAC   Post-op Pain Management:    Induction: Intravenous  PONV Risk  Score and Plan: 0 and Propofol infusion  Airway Management Planned: Natural Airway and Nasal Cannula  Additional Equipment: None  Intra-op Plan:   Post-operative Plan:   Informed Consent: I have reviewed the patients History and Physical, chart, labs and discussed the procedure including the risks, benefits and alternatives for the proposed anesthesia with the patient or authorized representative who has indicated his/her understanding and acceptance.       Plan Discussed with: CRNA  Anesthesia Plan Comments:         Anesthesia Quick Evaluation

## 2019-08-03 NOTE — TOC Transition Note (Signed)
Transition of Care [facility]) - CM/SW Discharge Note   Patient Details  Name: Karl Morgan MRN: [id number] Date of Birth: 01/02/1961  [facility] CM/SW Contact:  Pollie Friar, RN Phone Number: 08/03/2019, 4:17 PM   Clinical Narrative:    Pt discharging home with self care. No f/u per PT/OT and no DME needs.  Pt has transportation home.   Final next level of care: Home/Self Care Barriers to Discharge: No Barriers Identified   Patient Goals and CMS Choice        Discharge Placement                       Discharge Plan and Services                                     Social Determinants of Health (SDOH) Interventions     Readmission Risk Interventions No flowsheet data found.

## 2019-08-03 NOTE — Progress Notes (Signed)
  Echocardiogram Echocardiogram Transesophageal has been performed.  Karl Morgan 08/03/2019, 2:01 PM

## 2019-08-03 NOTE — Discharge Summary (Addendum)
Stroke Discharge Summary  Patient ID: Karl Morgan    l   MRN: [id number]      DOB: Jan 05, 1961  Date of Admission: 07/31/2019 Date of Discharge: 08/03/2019  Attending Physician:  Rosalin Hawking, MD, Stroke MD Consultant(s): Dorris Carnes MD, (cardiology-TEE), Cristopher Peru MD (EP Cardiology - loop placement)  Patient's PCP:  McLean-Scocuzza, Nino Glow, MD  DISCHARGE DIAGNOSIS:  Principal Problem:   Cerebellar infarct, right ([place]) - embolic, unknown source, s/p tPA Active Problems:   Diabetes mellitus type 2, controlled, with complications ([place])   Essential hypertension   Hyperlipidemia   Arteriosclerosis of coronary artery   History of stroke   Obesity (BMI 30.0-34.9)   CKD (chronic kidney disease), stage IIIa   Allergies as of 08/03/2019      Reactions   Pravachol [pravastatin Sodium] Other (See Comments)   Muscle aches      Medication List    TAKE these medications   alprostadil 20 MCG injection Commonly known as: EDEX 20 mcg by Intracavitary route as needed for erectile dysfunction. use no more than 3 times per week   aspirin EC 81 MG tablet Take 1 tablet (81 mg total) by mouth daily. STOP TAKING after 21 days and continue PLAVIX ALONE Start taking on: Aug 04, 2019 What changed: additional instructions   atorvastatin 80 MG tablet Commonly known as: LIPITOR Take 1 tablet (80 mg total) by mouth daily at 6 PM. What changed: when to take this   clopidogrel 75 MG tablet Commonly known as: PLAVIX Take 1 tablet (75 mg total) by mouth daily. Start taking on: Aug 04, 2019   glimepiride 2 MG tablet Commonly known as: AMARYL Take 1 tablet (2 mg total) by mouth daily with breakfast.   lisinopril 20 MG tablet Commonly known as: ZESTRIL Take 1 tablet (20 mg total) by mouth daily.   metFORMIN 850 MG tablet Commonly known as: GLUCOPHAGE Take 1 tablet (850 mg total) by mouth daily with breakfast.   metoprolol succinate 50 MG 24 hr tablet Commonly known as: TOPROL-XL Take 1 tablet  (50 mg total) by mouth daily. Take with or immediately following a meal.   Nitrostat 0.4 MG SL tablet Generic drug: nitroGLYCERIN Place 0.4 mg under the tongue every 5 (five) minutes as needed for chest pain.   omeprazole 20 MG tablet Commonly known as: PRILOSEC OTC Take 20 mg by mouth daily as needed (acid reflux/heartburn).   PEPCID AC PO Take 1-2 tablets by mouth daily.   sildenafil 100 MG tablet Commonly known as: Viagra Take 0.5-1 tablets (50-100 mg total) by mouth daily as needed for erectile dysfunction. What changed: how much to take   sitaGLIPtin 100 MG tablet Commonly known as: Januvia Take 1 tablet (100 mg total) by mouth daily.   testosterone cypionate 200 MG/ML injection Commonly known as: DEPOTESTOSTERONE CYPIONATE Inject 200 mg into the muscle every 14 (fourteen) days. Every other Sunday   valACYclovir 1000 MG tablet Commonly known as: VALTREX Take 1-2 tablets (1,000-2,000 mg total) by mouth See admin instructions. Take 1-2 tablets (1000 -2000 mg) by mouth every 12 hours for 2-3 days as needed for fever blisters       LABORATORY STUDIES CBC    Component Value Date/Time   WBC 8.9 08/03/2019 0721   RBC 4.80 08/03/2019 0721   HGB 15.8 08/03/2019 0721   HGB 14.0 06/24/2013 0611   HCT 44.9 08/03/2019 0721   HCT 40.5 06/24/2013 0611   PLT 141 (L) 08/03/2019 0721   PLT  159 06/24/2013 0611   MCV 93.5 08/03/2019 0721   MCV 92 06/24/2013 0611   MCH 32.9 08/03/2019 0721   MCHC 35.2 08/03/2019 0721   RDW 12.0 08/03/2019 0721   RDW 12.8 06/24/2013 0611   LYMPHSABS 1.6 07/31/2019 1938   LYMPHSABS 1.7 06/24/2013 0611   MONOABS 0.7 07/31/2019 1938   MONOABS 1.0 06/24/2013 0611   EOSABS 0.2 07/31/2019 1938   EOSABS 0.1 06/24/2013 0611   BASOSABS 0.0 07/31/2019 1938   BASOSABS 0.0 06/24/2013 0611   CMP    Component Value Date/Time   NA 137 08/03/2019 0721   NA 133 (L) 06/25/2013 0522   K 4.4 08/03/2019 0721   K 3.6 06/25/2013 0522   CL 107 08/03/2019  0721   CL 103 06/25/2013 0522   CO2 21 (L) 08/03/2019 0721   CO2 27 06/25/2013 0522   GLUCOSE 200 (H) 08/03/2019 0721   GLUCOSE 142 (H) 06/25/2013 0522   BUN 18 08/03/2019 0721   BUN 17 06/25/2013 0522   CREATININE 1.20 08/03/2019 0721   CREATININE 1.23 08/22/2017 1005   CALCIUM 8.6 (L) 08/03/2019 0721   CALCIUM 8.3 (L) 06/25/2013 0522   PROT 6.4 (L) 07/31/2019 1938   PROT 7.6 06/23/2013 0920   ALBUMIN 4.0 07/31/2019 1938   ALBUMIN 4.3 06/23/2013 0920   AST 26 07/31/2019 1938   AST 101 (H) 06/23/2013 0920   ALT 35 07/31/2019 1938   ALT 53 06/23/2013 0920   ALKPHOS 79 07/31/2019 1938   ALKPHOS 97 06/23/2013 0920   BILITOT 0.9 07/31/2019 1938   BILITOT 0.8 06/23/2013 0920   GFRNONAA >60 08/03/2019 0721   GFRNONAA 65 08/22/2017 1005   GFRAA >60 08/03/2019 0721   GFRAA 75 08/22/2017 1005   COAGS Lab Results  Component Value Date   INR 1.0 07/31/2019   INR 1.00 12/05/2017   Lipid Panel    Component Value Date/Time   CHOL 130 08/01/2019 0235   CHOL 109 09/22/2013 0908   TRIG 109 08/01/2019 0235   TRIG 99 09/22/2013 0908   HDL 33 (L) 08/01/2019 0235   HDL 25 (L) 09/22/2013 0908   CHOLHDL 3.9 08/01/2019 0235   VLDL 22 08/01/2019 0235   VLDL 20 09/22/2013 0908   LDLCALC 75 08/01/2019 0235   LDLCALC 71 08/22/2017 1005   LDLCALC 64 09/22/2013 0908   HgbA1C  Lab Results  Component Value Date   HGBA1C 8.1 (H) 08/01/2019   Urinalysis    Component Value Date/Time   COLORURINE COLORLESS (A) 07/31/2019 2330   APPEARANCEUR CLEAR 07/31/2019 2330   APPEARANCEUR Clear 06/23/2013 1942   LABSPEC 1.011 07/31/2019 2330   LABSPEC 1.023 06/23/2013 1942   PHURINE 7.0 07/31/2019 2330   GLUCOSEU >=500 (A) 07/31/2019 2330   GLUCOSEU >=500 06/23/2013 1942   GLUCOSEU >=1000 12/05/2011 1615   HGBUR NEGATIVE 07/31/2019 2330   BILIRUBINUR NEGATIVE 07/31/2019 2330   BILIRUBINUR neg 11/28/2017 1439   KETONESUR 5 (A) 07/31/2019 2330   PROTEINUR NEGATIVE 07/31/2019 2330    UROBILINOGEN 0.2 11/28/2017 1439   UROBILINOGEN 0.2 12/05/2011 1615   NITRITE NEGATIVE 07/31/2019 2330   LEUKOCYTESUR NEGATIVE 07/31/2019 2330   LEUKOCYTESUR Negative 06/23/2013 1942   Urine Drug Screen     Component Value Date/Time   LABOPIA NONE DETECTED 07/31/2019 2330   COCAINSCRNUR NONE DETECTED 07/31/2019 2330   LABBENZ NONE DETECTED 07/31/2019 2330   AMPHETMU NONE DETECTED 07/31/2019 2330   THCU NONE DETECTED 07/31/2019 2330   [place] DETECTED 07/31/2019 2330  Alcohol Level    Component Value Date/Time   [facility] <10 07/31/2019 1938    SIGNIFICANT DIAGNOSTIC STUDIES CT Code Stroke CTA Head W/WO contrast  Result Date: 07/31/2019 CLINICAL DATA:  Slurred speech.  Facial numbness. EXAM: CT ANGIOGRAPHY HEAD AND NECK TECHNIQUE: Multidetector CT imaging of the head and neck was performed using the standard protocol during bolus administration of intravenous contrast. Multiplanar CT image reconstructions and MIPs were obtained to evaluate the vascular anatomy. Carotid stenosis measurements (when applicable) are obtained utilizing NASCET criteria, using the distal internal carotid diameter as the denominator. CONTRAST:  67mL OMNIPAQUE IOHEXOL 350 MG/ML SOLN COMPARISON:  CT head earlier today FINDINGS: CTA NECK FINDINGS Aortic arch: Standard branching. Imaged portion shows no evidence of aneurysm or dissection. No significant stenosis of the major arch vessel origins. Right carotid system: Mild atherosclerotic disease right carotid bifurcation with less than 25% diameter stenosis right internal carotid artery. Left carotid system: Atherosclerotic calcification left carotid bifurcation with less than 25% diameter stenosis left internal carotid artery. Vertebral arteries: Left vertebral artery dominant and patent to the basilar without stenosis. Small right vertebral artery is diffusely diseased and occludes at approximately the C1 level. Skeleton: Cervical degenerative changes without acute  skeletal abnormality. Other neck: Negative Upper chest: Negative Review of the MIP images confirms the above findings CTA HEAD FINDINGS Anterior circulation: Mild atherosclerotic calcification in the cavernous carotid bilaterally without significant stenosis. Anterior and middle cerebral arteries patent bilaterally without stenosis. Posterior circulation: Left vertebral artery supplies the basilar. Right vertebral artery is small and occludes at C1. Small basilar artery which shows diffuse atherosclerotic disease. Superior cerebellar and posterior cerebral arteries patent bilaterally. Fetal origin of the posterior cerebral artery bilaterally. Venous sinuses: Normal venous enhancement. Right transverse sinus dominant. Anatomic variants: None Review of the MIP images confirms the above findings IMPRESSION: Mild atherosclerotic disease at the carotid bifurcation without significant carotid stenosis in the neck. Left vertebral artery widely patent Right vertebral artery is a small vessel with diffuse disease and occlusion at C1 No intracranial large vessel occlusion or flow limiting stenosis. Electronically Signed   By: Franchot Gallo M.D.   On: 07/31/2019 20:31   CT HEAD WO CONTRAST  Result Date: 08/02/2019 CLINICAL DATA:  Stroke follow-up EXAM: CT HEAD WITHOUT CONTRAST TECHNIQUE: Contiguous axial images were obtained from the base of the skull through the vertex without intravenous contrast. COMPARISON:  Brain MRI 08/01/2019 FINDINGS: Brain: Small area of central hemorrhage within the right cerebellar infarct is unchanged compared to the earlier MRI. There is no new site of hemorrhage or ischemia. There is mild mass effect within the posterior fossa but no hydrocephalus. Vascular: No abnormal hyperdensity of the major intracranial arteries or dural venous sinuses. No intracranial atherosclerosis. Skull: The visualized skull base, calvarium and extracranial soft tissues are normal. Sinuses/Orbits: No fluid levels or  advanced mucosal thickening of the visualized paranasal sinuses. No mastoid or middle ear effusion. The orbits are normal. IMPRESSION: Unchanged small area of central hemorrhage within the right cerebellar infarct. No new site of hemorrhage or ischemia. Electronically Signed   By: Ulyses Jarred M.D.   On: 08/02/2019 04:37   CT Code Stroke CTA Neck W/WO contrast  Result Date: 07/31/2019 CLINICAL DATA:  Slurred speech.  Facial numbness. EXAM: CT ANGIOGRAPHY HEAD AND NECK TECHNIQUE: Multidetector CT imaging of the head and neck was performed using the standard protocol during bolus administration of intravenous contrast. Multiplanar CT image reconstructions and MIPs were obtained to evaluate the vascular anatomy. Carotid  stenosis measurements (when applicable) are obtained utilizing NASCET criteria, using the distal internal carotid diameter as the denominator. CONTRAST:  38mL OMNIPAQUE IOHEXOL 350 MG/ML SOLN COMPARISON:  CT head earlier today FINDINGS: CTA NECK FINDINGS Aortic arch: Standard branching. Imaged portion shows no evidence of aneurysm or dissection. No significant stenosis of the major arch vessel origins. Right carotid system: Mild atherosclerotic disease right carotid bifurcation with less than 25% diameter stenosis right internal carotid artery. Left carotid system: Atherosclerotic calcification left carotid bifurcation with less than 25% diameter stenosis left internal carotid artery. Vertebral arteries: Left vertebral artery dominant and patent to the basilar without stenosis. Small right vertebral artery is diffusely diseased and occludes at approximately the C1 level. Skeleton: Cervical degenerative changes without acute skeletal abnormality. Other neck: Negative Upper chest: Negative Review of the MIP images confirms the above findings CTA HEAD FINDINGS Anterior circulation: Mild atherosclerotic calcification in the cavernous carotid bilaterally without significant stenosis. Anterior and middle  cerebral arteries patent bilaterally without stenosis. Posterior circulation: Left vertebral artery supplies the basilar. Right vertebral artery is small and occludes at C1. Small basilar artery which shows diffuse atherosclerotic disease. Superior cerebellar and posterior cerebral arteries patent bilaterally. Fetal origin of the posterior cerebral artery bilaterally. Venous sinuses: Normal venous enhancement. Right transverse sinus dominant. Anatomic variants: None Review of the MIP images confirms the above findings IMPRESSION: Mild atherosclerotic disease at the carotid bifurcation without significant carotid stenosis in the neck. Left vertebral artery widely patent Right vertebral artery is a small vessel with diffuse disease and occlusion at C1 No intracranial large vessel occlusion or flow limiting stenosis. Electronically Signed   By: Franchot Gallo M.D.   On: 07/31/2019 20:31   MR BRAIN WO CONTRAST  Result Date: 08/01/2019 CLINICAL DATA:  Stroke.  Dizziness.  Patient received tPA for stroke EXAM: MRI HEAD WITHOUT CONTRAST TECHNIQUE: Multiplanar, multiecho pulse sequences of the brain and surrounding structures were obtained without intravenous contrast. COMPARISON:  CT head 07/31/2019 FINDINGS: Brain: Acute infarct in the right superior cerebellum. There is associated hemorrhage within the infarct. No acute blood products are seen on CT yesterday. No other areas of acute infarct or hemorrhage. Small chronic infarct right frontal lobe. Cerebral white matter intact. Ventricle size and cerebral volume normal. Vascular: Normal arterial flow voids. Skull and upper cervical spine: No focal skeletal lesion. Sinuses/Orbits: Mild mucosal edema paranasal sinuses. Right mastoid effusion. Negative orbit Other: None IMPRESSION: Acute infarct right superior cerebellum. Superimposed hemorrhage is present within the infarct. Hemorrhage and infarct not seen on the CT yesterday. Mild-to-moderate amount of hemorrhage. Mild  local mass-effect without hydrocephalus. Preliminary report texted to Dr. Erlinda Hong via She[facility] Electronically Signed   By: Franchot Gallo M.D.   On: 08/01/2019 17:54   EP PPM/ICD IMPLANT  Result Date: 08/03/2019  CONCLUSIONS:  1. Successful implantation of a [employer] Reveal LINQ implantable loop recorder for cryptogenic stroke  2. No early apparent complications. Cristopher Peru, MD 08/03/2019 2:17 PM   ECHOCARDIOGRAM COMPLETE  Result Date: 08/01/2019    ECHOCARDIOGRAM REPORT   Patient Name:   Karl Morgan Date of Exam: 08/01/2019 Medical Rec #:  [id number]      Height:       72.0 in Accession #:    [id number]     Weight:       240.5 lb Date of Birth:  January 18, 1961      BSA:          2.304 m Patient Age:    17 years  BP:           137/78 mmHg Patient Gender: M              HR:           71 bpm. Exam Location:  Inpatient Procedure: 2D Echo, Cardiac Doppler and Color Doppler Indications:    Stroke 434.91/I163.9  History:        Patient has prior history of Echocardiogram examinations, most                 recent 02/17/2015. Previous Myocardial Infarction; Risk                 Factors:Diabetes, Hypertension and Dyslipidemia. Hx stroke.  Sonographer:    Clayton Lefort RDCS (AE) Referring Phys: [phone] MCNE[place] P [place]  1. Left ventricular ejection fraction, by estimation, is 65 to 70%. The left ventricle has hyperdynamic function. The left ventricle has no regional wall motion abnormalities. There is mild concentric left ventricular hypertrophy. Left ventricular diastolic parameters are consistent with Grade I diastolic dysfunction (impaired relaxation). Elevated left atrial pressure.  2. Right ventricular systolic function is normal. The right ventricular size is normal.  3. Right atrial size was mildly dilated.  4. The mitral valve is normal in structure. Mild mitral valve regurgitation. No evidence of mitral stenosis.  5. The aortic valve is normal in structure. Aortic valve regurgitation is not visualized. No  aortic stenosis is present.  6. The inferior vena cava is normal in size with greater than 50% respiratory variability, suggesting right atrial pressure of 3 mmHg. Conclusion(s)/Recommendation(s): No intracardiac source of embolism detected on this transthoracic study. A transesophageal echocardiogram is recommended to exclude cardiac source of embolism if clinically indicated. FINDINGS  Left Ventricle: Left ventricular ejection fraction, by estimation, is 65 to 70%. The left ventricle has hyperdynamic function. The left ventricle has no regional wall motion abnormalities. The left ventricular internal cavity size was normal in size. There is mild concentric left ventricular hypertrophy. Left ventricular diastolic parameters are consistent with Grade I diastolic dysfunction (impaired relaxation). Elevated left atrial pressure. Right Ventricle: The right ventricular size is normal. No increase in right ventricular wall thickness. Right ventricular systolic function is normal. Left Atrium: Left atrial size was normal in size. Right Atrium: Right atrial size was mildly dilated. Pericardium: There is no evidence of pericardial effusion. Mitral Valve: The mitral valve is normal in structure. Normal mobility of the mitral valve leaflets. Mild mitral valve regurgitation. No evidence of mitral valve stenosis. MV peak gradient, 2.8 mmHg. The mean mitral valve gradient is 1.0 mmHg. Tricuspid Valve: The tricuspid valve is normal in structure. Tricuspid valve regurgitation is trivial. No evidence of tricuspid stenosis. Aortic Valve: The aortic valve is normal in structure. Aortic valve regurgitation is not visualized. No aortic stenosis is present. Aortic valve mean gradient measures 5.0 mmHg. Aortic valve peak gradient measures 9.1 mmHg. Aortic valve area, by VTI measures 3.16 cm. Pulmonic Valve: The pulmonic valve was normal in structure. Pulmonic valve regurgitation is not visualized. No evidence of pulmonic stenosis.  Aorta: The aortic root is normal in size and structure. Venous: The inferior vena cava is normal in size with greater than 50% respiratory variability, suggesting right atrial pressure of 3 mmHg. IAS/Shunts: No atrial level shunt detected by color flow Doppler.  LEFT VENTRICLE PLAX 2D LVIDd:         4.50 cm  Diastology LVIDs:         3.10 cm  LV  e' lateral:   8.55 cm/s LV PW:         1.30 cm  LV E/e' lateral: 7.6 LV IVS:        1.20 cm  LV e' medial:    4.73 cm/s LVOT diam:     2.10 cm  LV E/e' medial:  13.7 LV SV:         90 LV SV Index:   39 LVOT Area:     3.46 cm  RIGHT VENTRICLE RV Basal diam:  3.50 cm RV S prime:     11.00 cm/s TAPSE (M-mode): 2.2 cm LEFT ATRIUM           Index       RIGHT ATRIUM           Index LA diam:      2.80 cm 1.22 cm/m  RA Area:     18.50 cm LA Vol (A2C): 37.9 ml 16.45 ml/m RA Volume:   61.10 ml  26.52 ml/m LA Vol (A4C): 50.0 ml 21.70 ml/m  AORTIC VALVE AV Area (Vmax):    3.12 cm AV Area (Vmean):   2.39 cm AV Area (VTI):     3.16 cm AV Vmax:           151.00 cm/s AV Vmean:          108.000 cm/s AV VTI:            0.286 m AV Peak Grad:      9.1 mmHg AV Mean Grad:      5.0 mmHg LVOT Vmax:         136.00 cm/s LVOT Vmean:        74.400 cm/s LVOT VTI:          0.261 m LVOT/AV VTI ratio: 0.91  AORTA Ao Root diam: 3.20 cm MITRAL VALVE MV Area (PHT): 2.24 cm    SHUNTS MV Peak grad:  2.8 mmHg    Systemic VTI:  0.26 m MV Mean grad:  1.0 mmHg    Systemic Diam: 2.10 cm MV Vmax:       0.84 m/s MV Vmean:      49.3 cm/s MV Decel Time: 338 msec MV E velocity: 64.60 cm/s MV A velocity: 59.70 cm/s MV E/A ratio:  1.08 Ena Dawley MD Electronically signed by Ena Dawley MD Signature Date/Time: 08/01/2019/3:50:23 PM    Final    ECHO TEE  Result Date: 08/03/2019    TRANSESOPHOGEAL ECHO REPORT   Patient Name:   Karl Morgan Date of Exam: 08/03/2019 Medical Rec #:  [id number]      Height:       72.0 in Accession #:    [id number]     Weight:       240.5 lb Date of Birth:  1960-12-08      BSA:           2.304 m Patient Age:    69 years       BP:           95/61 mmHg Patient Gender: M              HR:           70 bpm. Exam Location:  Inpatient Procedure: Transesophageal Echo, Limited Color Doppler and Saline Contrast            Bubble Study Indications:     Stroke  History:         Patient has prior history of  Echocardiogram examinations, most                  recent 08/01/2019. Risk Factors:Hypertension, Dyslipidemia and                  Diabetes.  Sonographer:     Mikki Santee RDCS (AE) Referring Phys:  [id number] PAULA V ROSS Diagnosing Phys: Dorris Carnes MD PROCEDURE: After discussion of the risks and benefits of a TEE, an informed consent was obtained from the patient. The transesophogeal probe was passed without difficulty through the esophogus of the patient. Sedation performed by different physician. The patient was monitored while under deep sedation. Anesthestetic sedation was provided intravenously by Anesthesiology: 233.65mg  of Propofol, 40mg  of Lidocaine. The patient developed no complications during the procedure. IMPRESSIONS  1. Left ventricular ejection fraction, by estimation, is 50 to 55%. The left ventricle has low normal function. The left ventricle has no regional wall motion abnormalities.  2. Right ventricular systolic function is normal. The right ventricular size is normal.  3. No left atrial/left atrial appendage thrombus was detected.  4. The mitral valve is normal in structure. Trivial mitral valve regurgitation.  5. The aortic valve is normal in structure. Aortic valve regurgitation is not visualized.  6. Minimal fixed plaquing in thoracic aorta. FINDINGS  Left Ventricle: Left ventricular ejection fraction, by estimation, is 50 to 55%. The left ventricle has low normal function. The left ventricle has no regional wall motion abnormalities. The left ventricular internal cavity size was normal in size. There is no left ventricular hypertrophy. Right Ventricle: The right ventricular  size is normal. No increase in right ventricular wall thickness. Right ventricular systolic function is normal. Left Atrium: Left atrial size was normal in size. No left atrial/left atrial appendage thrombus was detected. Right Atrium: Right atrial size was normal in size. Pericardium: There is no evidence of pericardial effusion. Mitral Valve: The mitral valve is normal in structure. Trivial mitral valve regurgitation. Tricuspid Valve: The tricuspid valve is normal in structure. Tricuspid valve regurgitation is not demonstrated. Aortic Valve: The aortic valve is normal in structure. Aortic valve regurgitation is not visualized. Pulmonic Valve: The pulmonic valve was normal in structure. Pulmonic valve regurgitation is not visualized. Aorta: Minimal fixed plaquing in thoracic aorta. The aortic root and ascending aorta are structurally normal, with no evidence of dilitation. IAS/Shunts: No atrial level shunt detected by color flow Doppler. Agitated saline contrast was given intravenously to evaluate for intracardiac shunting. Dorris Carnes MD Electronically signed by Dorris Carnes MD Signature Date/Time: 08/03/2019/2:28:35 PM    Final    CT HEAD CODE STROKE WO CONTRAST  Result Date: 07/31/2019 CLINICAL DATA:  Code stroke.  Slurred speech. EXAM: CT HEAD WITHOUT CONTRAST TECHNIQUE: Contiguous axial images were obtained from the base of the skull through the vertex without intravenous contrast. COMPARISON:  MRI head 02/17/2015 FINDINGS: Brain: Small hypodensity in the right frontal lobe. This was not present previously and is most consistent with an infarct. There appears to be some cortical volume loss in this is most likely a chronic infarct. No other area of acute infarct. Negative for hemorrhage or mass. Ventricle size normal. Vascular: Negative for hyperdense vessel Skull: Negative Sinuses/Orbits: Mild mucosal edema paranasal sinuses and right mastoid sinus. Negative orbit. Other: None ASPECTS ([place] Stroke Program  Early CT Score) - Ganglionic level infarction (caudate, lentiform nuclei, internal capsule, insula, M1-M3 cortex): 7 - Supraganglionic infarction (M4-M6 cortex): 3 Total score (0-10 with 10 being normal): 10 IMPRESSION: 1. Right frontal  hypodensity most likely a small chronic infarct. Negative for hemorrhage 2. ASPECTS is 10 3. These results were called by telephone at the time of interpretation on 07/31/2019 at 7:51 pm to provider Leonel Ramsay, who verbally acknowledged these results. Electronically Signed   By: Franchot Gallo M.D.   On: 07/31/2019 19:52      HISTORY OF PRESENT ILLNESS Karl Morgan is a 59 y.o. male with a history of previous stroke, DM, hyperlipidemia, who presents with acute dizziness, "head buzzing", gait instability which started abruptly shortly after 6 PM on 07/31/2019 ([facility]).  He was working on a light and then suddenly developed dizziness and had to crawl.  On arrival, Dr. Stark Jock evaluated him and noted his symptoms and activated the code stroke. He was taken for an emergent head CT which was negative, and on exam he had findings consistent with acute ischemic stroke.  He was unable to walk unassisted and therefore IV TPA was offered with discussion of the risks and benefits and the patient accepted. Premorbid modified rankin scale: 1.   HOSPITAL COURSE Karl Morgan is a 59 y.o. male with history of a previous stroke, DM, CAD with previous MI, hx of substance abuse and hyperlipidemia, who presents with acute dizziness, "head buzzing",and gait instability which started abruptly shortly after 6 PM.  The patient received IV t-PA Friday 07/31/19 at 21:15  Stroke: Acute infarct right superior cerebellum infarct w/ superimposed hemorrhagic transformation within the infarct - recurrent cryptogenic infarcts on DAPT   Resultant symptoms resolved  Code Stroke CT Head - Right frontal hypodensity most likely a small chronic infarct. Negative for hemorrhage. ASPECTS is 10   CTA H&N  - Mild atherosclerotic disease at the carotid bifurcation without significant carotid stenosis in the neck. 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 No intracranial large vessel occlusion or flow limiting stenosis.  MRI head - 08/01/19 - Acute infarct right superior cerebellum. Superimposed hemorrhage is present within the infarct. Mild local mass-effect without hydrocephalus.  CT Head - 08/02/19 - Unchanged small area of central hemorrhage within the right cerebellar infarct. No new site of hemorrhage or ischemia  2D Echo - EF 65 - 70%. No cardiac source of emboli identified.   TEE (no PFO, mild thoracic aorta atherosclerosis, LV fxn mildly reduced)  loop recorder placement 08/03/2019   Lacey Jensen Virus 2 - negative  LDL - 75  HgbA1c - 8.7  UDS - neg  aspirin 81 mg daily and clopidogrel 75 mg daily prior to admission, now on no antithrombotic given small hemorrhagic conversion. Resume DAPT on discharge for 3 weeks and then plavix alone.  Therapy recommendations:  None  Disposition:  return home  Hx of stroke  02/2015 - HA, left arm weakness and difficulty walking, MRI right motor strip small infarct, EF 60-65%. 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 A1C 7.2 and LDL 57. As per pt, he was on DAPT since then. He was also put on lipitor 80. Recommended TEE and loop at that time but not done.   Hypertension  Home BP meds: Zestril, Toprol  Treated w/ Cleviprex, now off  BP goal < 180/105 24h after tPA  Resume home BP meds  Long term BP goal normotensive  Hyperlipidemia  Home Lipid lowering medication: Lipitor 80 mg daily  LDL 75, goal < 70  Current lipid  lowering medication: lipitor 80  Continue statin at discharge  Diabetes type II, uncontrolled  Home diabetic meds: Januvia, Amaryl, metformin  Current diabetic meds: SSI    HgbA1c 8.7, goal < 7.0  CBG monitoring  Close PCP follow up  Other Stroke Risk Factors  Obesity, Body mass index is 32.62 kg/m., recommend weight loss, diet and exercise as appropriate   Coronary artery disease  Other Active Problems  Code status - Full code  Hx of substance abuse - UDS neg  CKD 3a - Creatinine 1.2  DISCHARGE EXAM Blood pressure 104/67, pulse (!) 58, temperature (!) 97.5 F (36.4 C), temperature source Axillary, resp. rate 19, height 6' (1.829 m), weight 109.1 kg, SpO2 98 %. General - Well nourished, well developed, in no apparent distress.  Ophthalmologic - fundi not visualized due to noncooperation.  Cardiovascular - Regular rhythm and rate.  Mental Status -  Level of arousal and orientation to time, place, and person were intact. Language including expression, naming, repetition, comprehension was assessed and found intact. Fund of Knowledge was assessed and was intact.  Cranial Nerves II - XII - II - Visual field intact OU. III, IV, VI - Extraocular movements intact. V - Facial sensation intact bilaterally. VII - Facial movement intact bilaterally. VIII - Hearing & vestibular intact bilaterally. X - Palate elevates symmetrically. XI - Chin turning & shoulder shrug intact bilaterally. XII - Tongue protrusion intact.  Motor Strength - The patient's strength was normal in all extremities and pronator drift was absent.  Bulk was normal and fasciculations were absent.   Motor Tone - Muscle tone was assessed at the neck and appendages and was normal.  Reflexes - The patient's reflexes were symmetrical in all extremities and he had no pathological reflexes.  Sensory - Light touch, temperature/pinprick were assessed and were symmetrical.    Coordination - The patient had normal movements in the hands and feet with no ataxia or dysmetria.  Tremor was absent.  Gait and Station - deferred.  Discharge Diet  [employer] thin  liquids  DISCHARGE PLAN  Disposition:  Return home, no therapy needs  aspirin 81 mg daily and clopidogrel 75 mg daily for secondary stroke prevention starting 08/04/19 for 3 weeks then PLAVIX alone.  Ongoing stroke risk factor control by Primary Care Physician at time of discharge  Follow-up PCP McLean-Scocuzza, Nino Glow, MD in 2 weeks.  Follow-up in [facility] in 4 weeks, office to schedule an appointment.   35 minutes were spent preparing discharge.  Rosalin Hawking, MD PhD Stroke Neurology 08/03/2019 7:11 PM

## 2019-08-03 NOTE — Transfer of Care (Signed)
Immediate Anesthesia Transfer of Care Note  Patient: Karl Morgan  Procedure(s) Performed: TRANSESOPHAGEAL ECHOCARDIOGRAM (TEE) (N/A )  Patient Location: Endoscopy Unit  Anesthesia Type:MAC  Level of Consciousness: drowsy  Airway & Oxygen Therapy: Patient Spontanous Breathing and Patient connected to face mask oxygen  Post-op Assessment: Report given to RN and Post -op Vital signs reviewed and stable  Post vital signs: Reviewed and stable  Last Vitals:  Vitals Value Taken Time  BP 88/54 08/03/19 1331  Temp 36.4 C 08/03/19 1331  Pulse 61 08/03/19 1332  Resp 12 08/03/19 1332  SpO2 99 % 08/03/19 1332  Vitals shown include unvalidated device data.  Last Pain:  Vitals:   08/03/19 1331  TempSrc: Axillary  PainSc: Asleep         Complications: No apparent anesthesia complications

## 2019-08-03 NOTE — Consult Note (Addendum)
ELECTROPHYSIOLOGY CONSULT NOTE  Patient ID: Karl Morgan MRN: [id number], DOB/AGE: 04/08/1960   Admit date: 07/31/2019 Date of Consult: 08/03/2019  Primary Physician: McLean-Scocuzza, Nino Glow, MD Primary Cardiologist: Dr. Clayborn Bigness [facility]) Reason for Consultation: Cryptogenic stroke ; recommendations regarding Implantable Loop Recorder, requested by Dr. Erlinda Hong  History of Present Illness Garwood Rumble[place] was admitted on 07/31/2019 with gait instability, dizziness found with stroke > tPA.    PMHx includes CAD (prior PCI 2015), prior stroke, DM, HTN, HLD,   Neurology notes; Acute infarct right superior cerebellum. Superimposed hemorrhage is present within the infarct.  Given recurrent CVA on DAPT, recommends TEE and loop for Afib suerveillence .  he has undergone workup for stroke including echocardiogram and carotid angio.  The patient has been monitored on telemetry which has demonstrated sinus rhythm with no arrhythmias.  Inpatient stroke work-up is to be completed with a TEE.   Echocardiogram this admission demonstrated  08/01/2019 IMPRESSIONS  1. Left ventricular ejection fraction, by estimation, is 65 to 70%. The  left ventricle has hyperdynamic function. The left ventricle has no  regional wall motion abnormalities. There is mild concentric left  ventricular hypertrophy. Left ventricular  diastolic parameters are consistent with Grade I diastolic dysfunction  (impaired relaxation). Elevated left atrial pressure.  2. Right ventricular systolic function is normal. The right ventricular  size is normal.  3. Right atrial size was mildly dilated.  4. The mitral valve is normal in structure. Mild mitral valve  regurgitation. No evidence of mitral stenosis.  5. The aortic valve is normal in structure. Aortic valve regurgitation is  not visualized. No aortic stenosis is present.  6. The inferior vena cava is normal in size with greater than 50%  respiratory variability, suggesting right  atrial pressure of 3 mmHg.  Conclusion(s)/Recommendation(s):   No intracardiac source of embolism detected on this transthoracic study.  A transesophageal echocardiogram is  recommended to exclude cardiac source of embolism if clinically indicated.     Lab work is reviewed.  Prior to admission, the patient denies chest pain, shortness of breath, dizziness, palpitations, or syncope.  He is recovering from their stroke with plans to home at discharge.  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  He feels these contributed to his stroke, but want to be very proactive and proceed with TEE/loop    Past Medical History:  Diagnosis Date  . Alopecia 04/04/2011  . Diabetes mellitus   . Erectile dysfunction 04/04/2011  . Heart murmur   . Hyperlipidemia 04/04/2011  . Hypogonadism male 04/04/2011  . Myocardial infarction ([place])   . Stroke ([place])   . Substance abuse ([place])   . Type II or unspecified type diabetes mellitus without mention of complication, uncontrolled 04/04/2011  . Unspecified essential hypertension 04/04/2011     Surgical History:  Past Surgical History:  Procedure Laterality Date  . CARDIAC CATHETERIZATION       Facility-Administered Medications Prior to Admission  Medication Dose Route Frequency Provider Last Rate Last Admin  . testosterone cypionate (DEPOTESTOTERONE CYPIONATE) injection 200 mg  200 mg Intramuscular Q14 Days Janith Lima, MD   200 mg at 07/29/12 1635   Medications Prior to Admission  Medication Sig Dispense Refill Last Dose  . alprostadil (EDEX) 20 MCG injection 20 mcg by Intracavitary route as needed for erectile dysfunction. use no  more than 3 times per week   07/26/2019  . aspirin EC 81 MG tablet Take 81 mg by mouth daily.   07/31/2019 at am  . atorvastatin (LIPITOR) 80 MG tablet Take 1 tablet (80 mg total) by mouth daily at 6  PM. (Patient taking differently: Take 80 mg by mouth daily. ) 90 tablet 3 07/31/2019 at am  . clopidogrel (PLAVIX) 75 MG tablet Take 75 mg by mouth daily.   07/31/2019 at am  . Famotidine (PEPCID AC PO) Take 1-2 tablets by mouth daily.   07/31/2019 at am  . glimepiride (AMARYL) 2 MG tablet Take 1 tablet (2 mg total) by mouth daily with breakfast. 90 tablet 3 07/31/2019 at am  . lisinopril (ZESTRIL) 20 MG tablet Take 1 tablet (20 mg total) by mouth daily. 90 tablet 3 07/31/2019 at am  . metFORMIN (GLUCOPHAGE) 850 MG tablet Take 1 tablet (850 mg total) by mouth daily with breakfast. 90 tablet 3 07/31/2019 at am  . metoprolol succinate (TOPROL-XL) 50 MG 24 hr tablet Take 1 tablet (50 mg total) by mouth daily. Take with or immediately following a meal. 90 tablet 3 07/31/2019 at 815  . nitroGLYCERIN (NITROSTAT) 0.4 MG SL tablet Place 0.4 mg under the tongue every 5 (five) minutes as needed for chest pain.    never taken  . omeprazole (PRILOSEC OTC) 20 MG tablet Take 20 mg by mouth daily as needed (acid reflux/heartburn).   week ago  . sildenafil (VIAGRA) 100 MG tablet Take 0.5-1 tablets (50-100 mg total) by mouth daily as needed for erectile dysfunction. (Patient taking differently: Take 100 mg by mouth daily as needed for erectile dysfunction. ) 5 tablet 11 07/28/2019  . sitaGLIPtin (JANUVIA) 100 MG tablet Take 1 tablet (100 mg total) by mouth daily. 90 tablet 3 07/31/2019 at am  . testosterone cypionate (DEPOTESTOSTERONE CYPIONATE) 200 MG/ML injection Inject 200 mg into the muscle every 14 (fourteen) days. Every other Sunday   07/19/2019  . valACYclovir (VALTREX) 1000 MG tablet Take two pills (2,000 mg) by mouth at onset of oral fever blister, then two more pills twelve hours later x 1 day (Patient taking differently: Take 1,000-2,000 mg by mouth See admin instructions. Take 1-2 tablets (1000 -2000 mg) by mouth every 12 hours for 2-3 days as needed for fever blisters) 30 tablet 5 3 weeks ago    Inpatient  Medications:  . atorvastatin  80 mg Oral Daily  . insulin aspart  0-15 Units Subcutaneous TID WC  . lisinopril  20 mg Oral Daily  . metoprolol succinate  50 mg Oral QPC breakfast  . pantoprazole  40 mg Oral Daily    Allergies:  Allergies  Allergen Reactions  . Pravachol [Pravastatin Sodium] Other (See Comments)    Muscle aches    Social History   Socioeconomic History  . Marital status: Married    Spouse name: Not on file  . Number of children: Not on file  . Years of education: Not on file  . Highest education level: Not on file  Occupational History  . Occupation: OPERATIONS MANAGER of maintenance    Employer:   Tobacco Use  . Smoking status: Never Smoker  . Smokeless tobacco: Never Used  Substance and Sexual Activity  . Alcohol use: No    Comment: former etoh abuse in 72s   . Drug use: No  . Sexual activity: Yes  Other Topics Concern  . 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 works in [place] wife Vickie             Social Determinants of Radio broadcast assistant Strain:   . Difficulty of Paying Living Expenses:   Food Insecurity:   . Worried About Charity fundraiser in the Last Year:   . Arboriculturist in the Last Year:   Transportation Needs:   . Film/video editor (Medical):   Marland Kitchen Lack of Transportation (Non-Medical):   Physical Activity:   . Days of Exercise per Week:   . Minutes of Exercise per Session:   Stress:   . Feeling of Stress :   Social Connections:   . Frequency of Communication with Friends and Family:   . Frequency of Social Gatherings with  Friends and Family:   . Attends Religious Services:   . Active Member of Clubs or Organizations:   . Attends Archivist Meetings:   Marland Kitchen Marital Status:   Intimate Partner Violence:   . Fear of Current or Ex-Partner:   . Emotionally Abused:   Marland Kitchen Physically Abused:   . Sexually Abused:      Family History  Problem Relation Age of Onset  . Hypertension Father   . Dementia Father        died age 68 in 07/15/17  . Memory loss Father   . Emphysema Mother   . COPD Mother   . Thyroid disease Mother   . Cancer Neg Hx   . Diabetes Neg Hx   . Heart disease Neg Hx   . Stroke Neg Hx       Review of Systems: All other systems reviewed and are otherwise negative except as noted above.  Physical Exam: Vitals:   08/02/19 1616 08/02/19 1956 08/03/19 0605 08/03/19 0800  BP: 124/86 (!) 141/87 121/76 124/85  Pulse: 68 67 64 90  Resp: 18 18 18 18   Temp: 98.2 F (36.8 C) 97.7 F (36.5 C) 98.5 F (36.9 C) 98.2 F (36.8 C)  TempSrc: Oral Oral Oral Oral  SpO2: 96% 99% 94% 96%  Weight:      Height:        GEN- The patient is well appearing, alert and oriented x 3 today.   Head- normocephalic, atraumatic Eyes-  Sclera clear, conjunctiva pink Ears- hearing intact Oropharynx- clear Neck- supple Lungs- CTA b/l, normal work of breathing Heart- RRR, no murmurs, rubs or gallops  GI- soft, NT, ND Extremities- no clubbing, cyanosis, or edema MS- no significant deformity or atrophy Skin- no rash or lesion Psych- euthymic mood, full affect   Labs:   Lab Results  Component Value Date   WBC 8.9 08/03/2019   HGB 15.8 08/03/2019   HCT 44.9 08/03/2019   MCV 93.5 08/03/2019   PLT 141 (L) 08/03/2019    Recent Labs  Lab 07/31/19 1938 08/01/19 0235 08/03/19 0721  NA 136  136   < > 137  K 4.2  4.1   < > 4.4  CL 100  100   < >  107  CO2 25   < > 21*  BUN 16  17   < > 18  CREATININE 1.34*  1.20   < > 1.20  CALCIUM 9.4   < > 8.6*  PROT 6.4*  --   --   BILITOT 0.9  --   --     ALKPHOS 79  --   --   ALT 35  --   --   AST 26  --   --   GLUCOSE 190*  175*   < > 200*   < > = values in this interval not displayed.   Lab Results  Component Value Date   CKTOTAL 444 (H) 06/24/2013   CKMB 12.0 (H) 06/24/2013   TROPONINI 0.32 (H) 02/17/2015   Lab Results  Component Value Date   CHOL 130 08/01/2019   CHOL 148 03/31/2019   CHOL 128 07/18/2018   Lab Results  Component Value Date   HDL 33 (L) 08/01/2019   HDL 28 (L) 03/31/2019   HDL 26 (L) 07/18/2018   Lab Results  Component Value Date   LDLCALC 75 08/01/2019   LDLCALC 93 03/31/2019   LDLCALC 80 07/18/2018   Lab Results  Component Value Date   TRIG 109 08/01/2019   TRIG 136 03/31/2019   TRIG 110 07/18/2018   Lab Results  Component Value Date   CHOLHDL 3.9 08/01/2019   CHOLHDL 5.3 03/31/2019   CHOLHDL 4.9 07/18/2018   Lab Results  Component Value Date   LDLDIRECT 157.3 03/18/2012   LDLDIRECT 99.7 04/05/2011    No results found for: DDIMER   Radiology/Studies: CT Code Stroke CTA Head W/WO contrast  Result Date: 07/31/2019 CLINICAL DATA:  Slurred speech.  Facial numbness. EXAM: CT ANGIOGRAPHY HEAD AND NECK TECHNIQUE: Multidetector CT imaging of the head and neck was performed using the standard protocol during bolus administration of intravenous contrast. Multiplanar CT image reconstructions and MIPs were obtained to evaluate the vascular anatomy. Carotid stenosis measurements (when applicable) are obtained utilizing NASCET criteria, using the distal internal carotid diameter as the denominator. CONTRAST:  75mL OMNIPAQUE IOHEXOL 350 MG/ML SOLN COMPARISON:  CT head earlier today FINDINGS: CTA NECK FINDINGS Aortic arch: Standard branching. Imaged portion shows no evidence of aneurysm or dissection. No significant stenosis of the major arch vessel origins. Right carotid system: Mild atherosclerotic disease right carotid bifurcation with less than 25% diameter stenosis right internal carotid artery. Left  carotid system: Atherosclerotic calcification left carotid bifurcation with less than 25% diameter stenosis left internal carotid artery. Vertebral arteries: Left vertebral artery dominant and patent to the basilar without stenosis. Small right vertebral artery is diffusely diseased and occludes at approximately the C1 level. Skeleton: Cervical degenerative changes without acute skeletal abnormality. Other neck: Negative Upper chest: Negative Review of the MIP images confirms the above findings CTA HEAD FINDINGS Anterior circulation: Mild atherosclerotic calcification in the cavernous carotid bilaterally without significant stenosis. Anterior and middle cerebral arteries patent bilaterally without stenosis. Posterior circulation: Left vertebral artery supplies the basilar. Right vertebral artery is small and occludes at C1. Small basilar artery which shows diffuse atherosclerotic disease. Superior cerebellar and posterior cerebral arteries patent bilaterally. Fetal origin of the posterior cerebral artery bilaterally. Venous sinuses: Normal venous enhancement. Right transverse sinus dominant. Anatomic variants: None Review of the MIP images confirms the above findings IMPRESSION: Mild atherosclerotic disease at the carotid bifurcation without significant carotid stenosis in the neck. Left vertebral artery widely patent Right vertebral artery is a small vessel with diffuse  disease and occlusion at C1 No intracranial large vessel occlusion or flow limiting stenosis. Electronically Signed   By: Franchot Gallo M.D.   On: 07/31/2019 20:31   CT HEAD WO CONTRAST  Result Date: 08/02/2019 CLINICAL DATA:  Stroke follow-up EXAM: CT HEAD WITHOUT CONTRAST TECHNIQUE: Contiguous axial images were obtained from the base of the skull through the vertex without intravenous contrast. COMPARISON:  Brain MRI 08/01/2019 FINDINGS: Brain: Small area of central hemorrhage within the right cerebellar infarct is unchanged compared to the  earlier MRI. There is no new site of hemorrhage or ischemia. There is mild mass effect within the posterior fossa but no hydrocephalus. Vascular: No abnormal hyperdensity of the major intracranial arteries or dural venous sinuses. No intracranial atherosclerosis. Skull: The visualized skull base, calvarium and extracranial soft tissues are normal. Sinuses/Orbits: No fluid levels or advanced mucosal thickening of the visualized paranasal sinuses. No mastoid or middle ear effusion. The orbits are normal. IMPRESSION: Unchanged small area of central hemorrhage within the right cerebellar infarct. No new site of hemorrhage or ischemia. Electronically Signed   By: Ulyses Jarred M.D.   On: 08/02/2019 04:37   CT Code Stroke CTA Neck W/WO contrast  Result Date: 07/31/2019 CLINICAL DATA:  Slurred speech.  Facial numbness. EXAM: CT ANGIOGRAPHY HEAD AND NECK TECHNIQUE: Multidetector CT imaging of the head and neck was performed using the standard protocol during bolus administration of intravenous contrast. Multiplanar CT image reconstructions and MIPs were obtained to evaluate the vascular anatomy. Carotid stenosis measurements (when applicable) are obtained utilizing NASCET criteria, using the distal internal carotid diameter as the denominator. CONTRAST:  54mL OMNIPAQUE IOHEXOL 350 MG/ML SOLN COMPARISON:  CT head earlier today FINDINGS: CTA NECK FINDINGS Aortic arch: Standard branching. Imaged portion shows no evidence of aneurysm or dissection. No significant stenosis of the major arch vessel origins. Right carotid system: Mild atherosclerotic disease right carotid bifurcation with less than 25% diameter stenosis right internal carotid artery. Left carotid system: Atherosclerotic calcification left carotid bifurcation with less than 25% diameter stenosis left internal carotid artery. Vertebral arteries: Left vertebral artery dominant and patent to the basilar without stenosis. Small right vertebral artery is diffusely  diseased and occludes at approximately the C1 level. Skeleton: Cervical degenerative changes without acute skeletal abnormality. Other neck: Negative Upper chest: Negative Review of the MIP images confirms the above findings CTA HEAD FINDINGS Anterior circulation: Mild atherosclerotic calcification in the cavernous carotid bilaterally without significant stenosis. Anterior and middle cerebral arteries patent bilaterally without stenosis. Posterior circulation: Left vertebral artery supplies the basilar. Right vertebral artery is small and occludes at C1. Small basilar artery which shows diffuse atherosclerotic disease. Superior cerebellar and posterior cerebral arteries patent bilaterally. Fetal origin of the posterior cerebral artery bilaterally. Venous sinuses: Normal venous enhancement. Right transverse sinus dominant. Anatomic variants: None Review of the MIP images confirms the above findings IMPRESSION: Mild atherosclerotic disease at the carotid bifurcation without significant carotid stenosis in the neck. Left vertebral artery widely patent Right vertebral artery is a small vessel with diffuse disease and occlusion at C1 No intracranial large vessel occlusion or flow limiting stenosis. Electronically Signed   By: Franchot Gallo M.D.   On: 07/31/2019 20:31   MR BRAIN WO CONTRAST  Result Date: 08/01/2019 CLINICAL DATA:  Stroke.  Dizziness.  Patient received tPA for stroke EXAM: MRI HEAD WITHOUT CONTRAST TECHNIQUE: Multiplanar, multiecho pulse sequences of the brain and surrounding structures were obtained without intravenous contrast. COMPARISON:  CT head 07/31/2019 FINDINGS: Brain: Acute infarct  in the right superior cerebellum. There is associated hemorrhage within the infarct. No acute blood products are seen on CT yesterday. No other areas of acute infarct or hemorrhage. Small chronic infarct right frontal lobe. Cerebral white matter intact. Ventricle size and cerebral volume normal. Vascular: Normal  arterial flow voids. Skull and upper cervical spine: No focal skeletal lesion. Sinuses/Orbits: Mild mucosal edema paranasal sinuses. Right mastoid effusion. Negative orbit Other: None IMPRESSION: Acute infarct right superior cerebellum. Superimposed hemorrhage is present within the infarct. Hemorrhage and infarct not seen on the CT yesterday. Mild-to-moderate amount of hemorrhage. Mild local mass-effect without hydrocephalus. Preliminary report texted to Dr. Erlinda Hong via [facility] Electronically Signed   By: Franchot Gallo M.D.   On: 08/01/2019 17:54    CT HEAD CODE STROKE WO CONTRAST  Result Date: 07/31/2019 CLINICAL DATA:  Code stroke.  Slurred speech. EXAM: CT HEAD WITHOUT CONTRAST TECHNIQUE: Contiguous axial images were obtained from the base of the skull through the vertex without intravenous contrast. COMPARISON:  MRI head 02/17/2015 FINDINGS: Brain: Small hypodensity in the right frontal lobe. This was not present previously and is most consistent with an infarct. There appears to be some cortical volume loss in this is most likely a chronic infarct. No other area of acute infarct. Negative for hemorrhage or mass. Ventricle size normal. Vascular: Negative for hyperdense vessel Skull: Negative Sinuses/Orbits: Mild mucosal edema paranasal sinuses and right mastoid sinus. Negative orbit. Other: None ASPECTS ([place] Stroke Program Early CT Score) - Ganglionic level infarction (caudate, lentiform nuclei, internal capsule, insula, M1-M3 cortex): 7 - Supraganglionic infarction (M4-M6 cortex): 3 Total score (0-10 with 10 being normal): 10 IMPRESSION: 1. Right frontal hypodensity most likely a small chronic infarct. Negative for hemorrhage 2. ASPECTS is 10 3. These results were called by telephone at the time of interpretation on 07/31/2019 at 7:51 pm to provider Leonel Ramsay, who verbally acknowledged these results. Electronically Signed   By: Franchot Gallo M.D.   On: 07/31/2019 19:52    12-lead ECG SR All prior EKG's  in EPIC reviewed with no documented atrial fibrillation  Telemetry SR  Assessment and Plan:  1. Cryptogenic stroke The patient presents with cryptogenic stroke.  The patient has a TEE planned for this today.  I spoke at length with the patient about monitoring for afib with either a 30 day event monitor or an implantable loop recorder.  Risks, benefits, and alteratives to implantable loop recorder were discussed with the patient today.   At this time, the patient is very clear in his decision to proceed with implantable loop recorder.   He did have some hemorrhagic transformation, if AFib is found early, will need to discuss with neurology timing of a/c  Wound care was reviewed with the patient (keep incision clean and dry for 3 days).  Wound check will be scheduled for the patient  Please call with questions.   Renee Dyane Dustman, PA-C 08/03/2019  EP Attending  Patient seen and examined. Agree with the findings as noted above. The patient has sustained a cryptogenic stroke with a nice recovery. He has no h/o atrial fib but is at increased risk. I have reviewed the indications/risks/benefits/goals/expectations of ILR insertion and he wishes to proceed.  Mikle Bosworth.D.

## 2019-08-03 NOTE — Discharge Instructions (Signed)
Wound care instructions (heart monitor) Keep incision clean and dry for 3 days.  You can remove outer dressing tomorrow. Leave steri-strips (little pieces of tape) on until seen in the office for wound check appointment. Call the office ([phone]) for redness, drainage, swelling, or fever.  

## 2019-08-04 ENCOUNTER — Telehealth: Payer: Self-pay

## 2019-08-04 NOTE — Telephone Encounter (Signed)
Pt called returning your call 

## 2019-08-04 NOTE — Telephone Encounter (Signed)
First attempt to contact patient for transition of care. No answer. Left message for patient to call the office back or nurse will call again tomorrow. Will follow as appropriate. Keep scheduled hospital follow up appointment scheduled 08/06/19 @ 9:00.

## 2019-08-04 NOTE — Telephone Encounter (Signed)
Transition Care Management Follow-up Telephone Call  Date of discharge and from where: 08/03/19 from [facility]  How have you been since you were released from the hospital? Patient states, "Balance not as good, R foot and leg weakness. Purposefully walking for strength and exercise". No aid in use when ambulating. Denies dizziness, headache, nausea, chest pain, vomiting. Today, BP 128/76. Appetite is good, eating more vegetables. Denies difficulty swallowing. Loop recorder in place with reddened site; notes the procedure was aggressive but still monitoring closely to make sure no infection presents. Surgical strips in place, secure. Unable to check glucose level; no glucometer. Patient states, "I feel like my diabetes is out of control and would like to consider monitoring at home".   Any questions or concerns? Why stop aspirin 81mg  after 21 days? Why do I need to change time of day to 6pm when taking atorvastatin? Notes usually takes all medications in the morning and has been taking aspirin 81mg  for many years. Desires to follow up with PMD for medication management.   Items Reviewed:  Did the pt receive and understand the discharge instructions provided? Yes  Medications obtained and verified? Yes. Taking all scheduled medications. Stop taking aspirin 81mg  in 21 days.  Continue with plavix 75mg  during and after stopping aspirin.   Any new allergies since your discharge? No  Dietary orders reviewed? Yes, heart healthy thin liquids  Do you have support at home? Yes, wife  Functional Questionnaire: (I = Independent and D = Dependent) ADLs: I  Follow up appointments reviewed:   PCP Hospital f/u appt confirmed? Scheduled to see Dr. Orland Mustard on 08/06/19 @ 9:00.  Follow up in [facility] in 4 weeks- facility will call to schedule  Are transportation arrangements needed? No  If their condition worsens, is the pt aware to call PCP  or go to the Emergency Dept.? Yes  Was the patient provided with contact information for the PCP's office or ED? Yes  Was to pt encouraged to call back with questions or concerns? Yes

## 2019-08-05 ENCOUNTER — Other Ambulatory Visit: Payer: Self-pay | Admitting: *Deleted

## 2019-08-05 DIAGNOSIS — I209 Angina pectoris, unspecified: Secondary | ICD-10-CM | POA: Diagnosis not present

## 2019-08-05 DIAGNOSIS — I639 Cerebral infarction, unspecified: Secondary | ICD-10-CM | POA: Diagnosis not present

## 2019-08-05 DIAGNOSIS — E669 Obesity, unspecified: Secondary | ICD-10-CM | POA: Diagnosis not present

## 2019-08-05 DIAGNOSIS — I2510 Atherosclerotic heart disease of native coronary artery without angina pectoris: Secondary | ICD-10-CM | POA: Diagnosis not present

## 2019-08-05 NOTE — Patient Outreach (Signed)
[facility]) Care Management  08/05/2019  Karl Morgan 02-15-1961 [id number]   Transition of care call/case closure   Referral received:08/03/19 Initial outreach:08/05/19 Insurance: UMR    Subjective: Initial successful telephone call to patient's preferred number in order to complete transition of care assessment; 2 HIPAA identifiers verified. Explained purpose of call and completed transition of care assessment.  Karl Morgan states that he is feeling much better. He denies dizziness he reports tolerating mobility, taking short walk outside. He reports a little balance problem noted but it is 99% better. He reports continuing to work on using his right  hand as recommended by inpatient physical therapy. He discussed making changes in diet limiting salt . He voiced understanding of changes to help prevent stroke , diet changes, controlling blood pressure , keeping Diabetes under control and managing stress. He is familiar with [place] Chronic condition management program and denies need for referral , he states with consistent follow up with providers he will be able to control conditions. He plans to discuss need to monitor blood sugars with MD on tomorrow he understand recent increase in his Hemoglobin A1c and control range.  He discussed stressful job, reviewed [employer] Employee assistance program he has been enrolled in the past , and declines need for contact information for program at this time. Reviewed [facility] stroke education with patient and action plan. Patient wife is available to assist in his recovery as needed. Patient plans to return to work for a few hours after visit with PCP on tomorrow.    He is unsure if he has the hospital indemnity but will look up his benefits and make contact if needed.  He uses a [facility] outpatient pharmacy at [place].     Objective:  Karl Morgan  was hospitalized at [facility] from 4/30-08/03/19 for Right  Cerebellar infarct s/p TPA , Loop recorder placement.  Comorbidities include: Type 2 Diabetes A1c 8.1%, Hypertension, Hyperlipidemia, history of stroke, Chronic kidney disease stage 111a,  He was discharged to home on 08/03/19 without the need for home health services or DME.   Assessment:  Patient voices good understanding of all discharge instructions.  See transition of care flowsheet for assessment details.   Plan:  Reviewed hospital discharge diagnosis of Stroke right superior cerebellum    and discharge treatment plan using hospital discharge instructions, assessing medication adherence, reviewing problems requiring provider notification, and discussing the importance of follow up with surgeon, primary care provider and/or specialists as directed.  No ongoing care management needs identified so will close case to [place] Management services and route successful outreach letter with [place] Management pamphlet and 24 Hour Nurse Line Magnet to [place] [facility] clinical pool to be mailed to patient's home address.  Thanked patient for their services to [facility].  Joylene Draft, RN, BSN  [place] Management Coordinator  [phone]- Mobile [phone]- Toll Free Main Office

## 2019-08-06 ENCOUNTER — Ambulatory Visit: Payer: 59 | Admitting: Internal Medicine

## 2019-08-06 ENCOUNTER — Encounter: Payer: Self-pay | Admitting: Internal Medicine

## 2019-08-06 ENCOUNTER — Other Ambulatory Visit: Payer: Self-pay | Admitting: Internal Medicine

## 2019-08-06 ENCOUNTER — Other Ambulatory Visit: Payer: Self-pay

## 2019-08-06 VITALS — BP 130/86 | HR 61 | Temp 97.3°F | Ht 72.0 in | Wt 234.6 lb

## 2019-08-06 DIAGNOSIS — I6340 Cerebral infarction due to embolism of unspecified cerebral artery: Secondary | ICD-10-CM | POA: Insufficient documentation

## 2019-08-06 DIAGNOSIS — I672 Cerebral atherosclerosis: Secondary | ICD-10-CM | POA: Diagnosis not present

## 2019-08-06 DIAGNOSIS — I6523 Occlusion and stenosis of bilateral carotid arteries: Secondary | ICD-10-CM | POA: Diagnosis not present

## 2019-08-06 DIAGNOSIS — I10 Essential (primary) hypertension: Secondary | ICD-10-CM

## 2019-08-06 DIAGNOSIS — E559 Vitamin D deficiency, unspecified: Secondary | ICD-10-CM

## 2019-08-06 DIAGNOSIS — E1165 Type 2 diabetes mellitus with hyperglycemia: Secondary | ICD-10-CM | POA: Diagnosis not present

## 2019-08-06 DIAGNOSIS — I340 Nonrheumatic mitral (valve) insufficiency: Secondary | ICD-10-CM | POA: Diagnosis not present

## 2019-08-06 DIAGNOSIS — N529 Male erectile dysfunction, unspecified: Secondary | ICD-10-CM | POA: Diagnosis not present

## 2019-08-06 DIAGNOSIS — E118 Type 2 diabetes mellitus with unspecified complications: Secondary | ICD-10-CM | POA: Diagnosis not present

## 2019-08-06 DIAGNOSIS — E785 Hyperlipidemia, unspecified: Secondary | ICD-10-CM

## 2019-08-06 DIAGNOSIS — E538 Deficiency of other specified B group vitamins: Secondary | ICD-10-CM

## 2019-08-06 DIAGNOSIS — E349 Endocrine disorder, unspecified: Secondary | ICD-10-CM

## 2019-08-06 DIAGNOSIS — R7989 Other specified abnormal findings of blood chemistry: Secondary | ICD-10-CM

## 2019-08-06 MED ORDER — GLIMEPIRIDE 2 MG PO TABS: 2.0000 mg | ORAL_TABLET | Freq: Two times a day (BID) | ORAL | 3 refills | Status: DC

## 2019-08-06 MED ORDER — LISINOPRIL 40 MG PO TABS: 40.0000 mg | ORAL_TABLET | Freq: Every day | ORAL | 3 refills | Status: DC

## 2019-08-06 MED ORDER — FREESTYLE LIBRE 14 DAY READER DEVI: 1.0000 | Freq: Two times a day (BID) | 11 refills | Status: DC

## 2019-08-06 MED ORDER — BLOOD GLUCOSE METER KIT: PACK | 0 refills | Status: DC

## 2019-08-06 MED ORDER — FREESTYLE LIBRE 14 DAY SENSOR MISC: 1.0000 | Freq: Two times a day (BID) | 11 refills | Status: DC

## 2019-08-06 MED ORDER — EZETIMIBE 10 MG PO TABS: 10.0000 mg | ORAL_TABLET | Freq: Every day | ORAL | 3 refills | Status: DC

## 2019-08-06 NOTE — Patient Instructions (Addendum)
Goal BP<130/<80  LDL goal <70  Goal sugar in am 90 to <130, goal <180 2 hours  after meals   Debrox ear wax drops 5-10 drops each ear let sit 5-10 minutes x 1 week 1x per month  Allegra/Claritin/Zyrtec or Xyzal and Flonase over the counter   If you want your ears flushed schedule a nurse visit here or go to ENT   Ezetimibe Tablets What is this medicine? EZETIMIBE (ez ET i mibe) blocks the absorption of cholesterol from the stomach. It can help lower blood cholesterol for patients who are at risk of getting heart disease or a stroke. It is only for patients whose cholesterol level is not controlled by diet. This medicine may be used for other purposes; ask your health care provider or pharmacist if you have questions. COMMON BRAND NAME(S): Zetia What should I tell my health care provider before I take this medicine? They need to know if you have any of these conditions:  liver disease  an unusual or allergic reaction to ezetimibe, medicines, foods, dyes, or preservatives  pregnant or trying to get pregnant  breast-feeding How should I use this medicine? Take this medicine by mouth with a glass of water. Follow the directions on the prescription label. This medicine can be taken with or without food. Take your doses at regular intervals. Do not take your medicine more often than directed. Talk to your pediatrician regarding the use of this medicine in children. Special care may be needed. Overdosage: If you think you have taken too much of this medicine contact a poison control center or emergency room at once. NOTE: This medicine is only for you. Do not share this medicine with others. What if I miss a dose? If you miss a dose, take it as soon as you can. If it is almost time for your next dose, take only that dose. Do not take double or extra doses. What may interact with this medicine? Do not take this medicine with any of the following  medications:  fenofibrate  gemfibrozil This medicine may also interact with the following medications:  antacids  cyclosporine  herbal medicines like red yeast rice  other medicines to lower cholesterol or triglycerides This list may not describe all possible interactions. Give your health care provider a list of all the medicines, herbs, non-prescription drugs, or dietary supplements you use. Also tell them if you smoke, drink alcohol, or use illegal drugs. Some items may interact with your medicine. What should I watch for while using this medicine? Visit your doctor or health care professional for regular checks on your progress. You will need to have your cholesterol levels checked. If you are also taking some other cholesterol medicines, you will also need to have tests to make sure your liver is working properly. Tell your doctor or health care professional if you get any unexplained muscle pain, tenderness, or weakness, especially if you also have a fever and tiredness. You need to follow a low-cholesterol, low-fat diet while you are taking this medicine. This will decrease your risk of getting heart and blood vessel disease. Exercising and avoiding alcohol and smoking can also help. Ask your doctor or dietician for advice. What side effects may I notice from receiving this medicine? Side effects that you should report to your doctor or health care professional as soon as possible:  allergic reactions like skin rash, itching or hives, swelling of the face, lips, or tongue  dark yellow or brown urine  unusually  weak or tired  yellowing of the skin or eyes Side effects that usually do not require medical attention (report to your doctor or health care professional if they continue or are bothersome):  diarrhea  dizziness  headache  stomach upset or pain This list may not describe all possible side effects. Call your doctor for medical advice about side effects. You may  report side effects to FDA at 1-800-FDA-1088. Where should I keep my medicine? Keep out of the reach of children. Store at room temperature between 15 and 30 degrees C (59 and 86 degrees F). Protect from moisture. Keep container tightly closed. Throw away any unused medicine after the expiration date. NOTE: This sheet is a summary. It may not cover all possible information. If you have questions about this medicine, talk to your doctor, pharmacist, or health care provider.  2020 Elsevier/Gold Standard (2011-09-24 15:39:09)  Diabetes Mellitus and Nutrition, Adult When you have diabetes (diabetes mellitus), it is very important to have healthy eating habits because your blood sugar (glucose) levels are greatly affected by what you eat and drink. Eating healthy foods in the appropriate amounts, at about the same times every day, can help you:  Control your blood glucose.  Lower your risk of heart disease.  Improve your blood pressure.  Reach or maintain a healthy weight. Every person with diabetes is different, and each person has different needs for a meal plan. Your health care provider may recommend that you work with a diet and nutrition specialist (dietitian) to make a meal plan that is best for you. Your meal plan may vary depending on factors such as:  The calories you need.  The medicines you take.  Your weight.  Your blood glucose, blood pressure, and cholesterol levels.  Your activity level.  Other health conditions you have, such as heart or kidney disease. How do carbohydrates affect me? Carbohydrates, also called carbs, affect your blood glucose level more than any other type of food. Eating carbs naturally raises the amount of glucose in your blood. Carb counting is a method for keeping track of how many carbs you eat. Counting carbs is important to keep your blood glucose at a healthy level, especially if you use insulin or take certain oral diabetes medicines. It is  important to know how many carbs you can safely have in each meal. This is different for every person. Your dietitian can help you calculate how many carbs you should have at each meal and for each snack. Foods that contain carbs include:  Bread, cereal, rice, pasta, and crackers.  Potatoes and corn.  Peas, beans, and lentils.  Milk and yogurt.  Fruit and juice.  Desserts, such as cakes, cookies, ice cream, and candy. How does alcohol affect me? Alcohol can cause a sudden decrease in blood glucose (hypoglycemia), especially if you use insulin or take certain oral diabetes medicines. Hypoglycemia can be a life-threatening condition. Symptoms of hypoglycemia (sleepiness, dizziness, and confusion) are similar to symptoms of having too much alcohol. If your health care provider says that alcohol is safe for you, follow these guidelines:  Limit alcohol intake to no more than 1 drink per day for nonpregnant women and 2 drinks per day for men. One drink equals 12 oz of beer, 5 oz of wine, or 1 oz of hard liquor.  Do not drink on an empty stomach.  Keep yourself hydrated with water, diet soda, or unsweetened iced tea.  Keep in mind that regular soda, juice, and other  mixers may contain a lot of sugar and must be counted as carbs. What are tips for following this plan?  Reading food labels  Start by checking the serving size on the "Nutrition Facts" label of packaged foods and drinks. The amount of calories, carbs, fats, and other nutrients listed on the label is based on one serving of the item. Many items contain more than one serving per package.  Check the total grams (g) of carbs in one serving. You can calculate the number of servings of carbs in one serving by dividing the total carbs by 15. For example, if a food has 30 g of total carbs, it would be equal to 2 servings of carbs.  Check the number of grams (g) of saturated and trans fats in one serving. Choose foods that have low or  no amount of these fats.  Check the number of milligrams (mg) of salt (sodium) in one serving. Most people should limit total sodium intake to less than 2,300 mg per day.  Always check the nutrition information of foods labeled as "low-fat" or "nonfat". These foods may be higher in added sugar or refined carbs and should be avoided.  Talk to your dietitian to identify your daily goals for nutrients listed on the label. Shopping  Avoid buying canned, premade, or processed foods. These foods tend to be high in fat, sodium, and added sugar.  Shop around the outside edge of the grocery store. This includes fresh fruits and vegetables, bulk grains, fresh meats, and fresh dairy. Cooking  Use low-heat cooking methods, such as baking, instead of high-heat cooking methods like deep frying.  Cook using healthy oils, such as olive, canola, or sunflower oil.  Avoid cooking with butter, cream, or high-fat meats. Meal planning  Eat meals and snacks regularly, preferably at the same times every day. Avoid going long periods of time without eating.  Eat foods high in fiber, such as fresh fruits, vegetables, beans, and whole grains. Talk to your dietitian about how many servings of carbs you can eat at each meal.  Eat 4-6 ounces (oz) of lean protein each day, such as lean meat, chicken, fish, eggs, or tofu. One oz of lean protein is equal to: ? 1 oz of meat, chicken, or fish. ? 1 egg. ?  cup of tofu.  Eat some foods each day that contain healthy fats, such as avocado, nuts, seeds, and fish. Lifestyle  Check your blood glucose regularly.  Exercise regularly as told by your health care provider. This may include: ? 150 minutes of moderate-intensity or vigorous-intensity exercise each week. This could be brisk walking, biking, or water aerobics. ? Stretching and doing strength exercises, such as yoga or weightlifting, at least 2 times a week.  Take medicines as told by your health care  provider.  Do not use any products that contain nicotine or tobacco, such as cigarettes and e-cigarettes. If you need help quitting, ask your health care provider.  Work with a Social worker or diabetes educator to identify strategies to manage stress and any emotional and social challenges. Questions to ask a health care provider  Do I need to meet with a diabetes educator?  Do I need to meet with a dietitian?  What number can I call if I have questions?  When are the best times to check my blood glucose? Where to find more information:  American Diabetes Association: diabetes.org  Academy of Nutrition and Dietetics: www.eatright.[employer] of Diabetes and Digestive and  Kidney Diseases (NIH): [employer] Summary  A healthy meal plan will help you control your blood glucose and maintain a healthy lifestyle.  Working with a diet and nutrition specialist (dietitian) can help you make a meal plan that is best for you.  Keep in mind that carbohydrates (carbs) and alcohol have immediate effects on your blood glucose levels. It is important to count carbs and to use alcohol carefully. This information is not intended to replace advice given to you by your health care provider. Make sure you discuss any questions you have with your health care provider. Document Revised: 03/01/2017 Document Reviewed: 04/23/2016 Elsevier Patient Education  2020 [place] Eating Plan DASH stands for "Dietary Approaches to Stop Hypertension." The DASH eating plan is a healthy eating plan that has been shown to reduce high blood pressure (hypertension). It may also reduce your risk for type 2 diabetes, heart disease, and stroke. The DASH eating plan may also help with weight loss. What are tips for following this plan?  General guidelines  Avoid eating more than 2,300 mg (milligrams) of salt (sodium) a day. If you have hypertension, you may need to reduce your sodium intake to  1,500 mg a day.  Limit alcohol intake to no more than 1 drink a day for nonpregnant women and 2 drinks a day for men. One drink equals 12 oz of beer, 5 oz of wine, or 1 oz of hard liquor.  Work with your health care provider to maintain a healthy body weight or to lose weight. Ask what an ideal weight is for you.  Get at least 30 minutes of exercise that causes your heart to beat faster (aerobic exercise) most days of the week. Activities may include walking, swimming, or biking.  Work with your health care provider or diet and nutrition specialist (dietitian) to adjust your eating plan to your individual calorie needs. Reading food labels   Check food labels for the amount of sodium per serving. Choose foods with less than 5 percent of the Daily Value of sodium. Generally, foods with less than 300 mg of sodium per serving fit into this eating plan.  To find whole grains, look for the word "whole" as the first word in the ingredient list. Shopping  Buy products labeled as "low-sodium" or "no salt added."  Buy fresh foods. Avoid canned foods and premade or frozen meals. Cooking  Avoid adding salt when cooking. Use salt-free seasonings or herbs instead of table salt or sea salt. Check with your health care provider or pharmacist before using salt substitutes.  Do not fry foods. Cook foods using healthy methods such as baking, boiling, grilling, and broiling instead.  Cook with heart-healthy oils, such as olive, canola, soybean, or sunflower oil. Meal planning  Eat a balanced diet that includes: ? 5 or more servings of fruits and vegetables each day. At each meal, try to fill half of your plate with fruits and vegetables. ? Up to 6-8 servings of whole grains each day. ? Less than 6 oz of lean meat, poultry, or fish each day. A 3-oz serving of meat is about the same size as a deck of cards. One egg equals 1 oz. ? 2 servings of low-fat dairy each day. ? A serving of nuts, seeds, or  beans 5 times each week. ? Heart-healthy fats. Healthy fats called Omega-3 fatty acids are found in foods such as flaxseeds and coldwater fish, like sardines, salmon, and mackerel.  Limit how much you  eat of the following: ? Canned or prepackaged foods. ? Food that is high in trans fat, such as fried foods. ? Food that is high in saturated fat, such as fatty meat. ? Sweets, desserts, sugary drinks, and other foods with added sugar. ? Full-fat dairy products.  Do not salt foods before eating.  Try to eat at least 2 vegetarian meals each week.  Eat more home-cooked food and less restaurant, buffet, and fast food.  When eating at a restaurant, ask that your food be prepared with less salt or no salt, if possible. What foods are recommended? The items listed may not be a complete list. Talk with your dietitian about what dietary choices are best for you. Grains Whole-grain or whole-wheat bread. Whole-grain or whole-wheat pasta. Brown rice. [place]. Bulgur. Whole-grain and low-sodium cereals. Pita bread. Low-fat, low-sodium crackers. Whole-wheat flour tortillas. Vegetables Fresh or frozen vegetables (raw, steamed, roasted, or grilled). Low-sodium or reduced-sodium tomato and vegetable juice. Low-sodium or reduced-sodium tomato sauce and tomato paste. Low-sodium or reduced-sodium canned vegetables. Fruits All fresh, dried, or frozen fruit. Canned fruit in natural juice (without added sugar). Meat and other protein foods Skinless chicken or [place]. Ground chicken or [place]. Pork with fat trimmed off. Fish and seafood. Egg whites. Dried beans, peas, or lentils. Unsalted nuts, nut butters, and seeds. Unsalted canned beans. Lean cuts of beef with fat trimmed off. Low-sodium, lean deli meat. Dairy Low-fat (1%) or fat-free (skim) milk. Fat-free, low-fat, or reduced-fat cheeses. Nonfat, low-sodium ricotta or cottage cheese. Low-fat or nonfat yogurt. Low-fat, low-sodium cheese. Fats and  oils Soft margarine without trans fats. Vegetable oil. Low-fat, reduced-fat, or light mayonnaise and salad dressings (reduced-sodium). Canola, safflower, olive, soybean, and sunflower oils. Avocado. Seasoning and other foods Herbs. Spices. Seasoning mixes without salt. Unsalted popcorn and pretzels. Fat-free sweets. What foods are not recommended? The items listed may not be a complete list. Talk with your dietitian about what dietary choices are best for you. Grains Baked goods made with fat, such as croissants, muffins, or some breads. Dry pasta or rice meal packs. Vegetables Creamed or fried vegetables. Vegetables in a cheese sauce. Regular canned vegetables (not low-sodium or reduced-sodium). Regular canned tomato sauce and paste (not low-sodium or reduced-sodium). Regular tomato and vegetable juice (not low-sodium or reduced-sodium). Angie Fava. Olives. Fruits Canned fruit in a light or heavy syrup. Fried fruit. Fruit in cream or butter sauce. Meat and other protein foods Fatty cuts of meat. Ribs. Fried meat. Berniece Salines. Sausage. Bologna and other processed lunch meats. Salami. Fatback. Hotdogs. Bratwurst. Salted nuts and seeds. Canned beans with added salt. Canned or smoked fish. Whole eggs or egg yolks. Chicken or [place] with skin. Dairy Whole or 2% milk, cream, and half-and-half. Whole or full-fat cream cheese. Whole-fat or sweetened yogurt. Full-fat cheese. Nondairy creamers. Whipped toppings. Processed cheese and cheese spreads. Fats and oils Butter. Stick margarine. Lard. Shortening. Ghee. Bacon fat. Tropical oils, such as coconut, palm kernel, or palm oil. Seasoning and other foods Salted popcorn and pretzels. Onion salt, garlic salt, seasoned salt, table salt, and sea salt. Worcestershire sauce. Tartar sauce. Barbecue sauce. Teriyaki sauce. Soy sauce, including reduced-sodium. Steak sauce. Canned and packaged gravies. Fish sauce. Oyster sauce. Cocktail sauce. Horseradish that you find on the  shelf. Ketchup. Mustard. Meat flavorings and tenderizers. Bouillon cubes. Hot sauce and Tabasco sauce. Premade or packaged marinades. Premade or packaged taco seasonings. Relishes. Regular salad dressings. Where to find more information:  National Heart, Lung, and Blood Institute: https://wilson-eaton.com/  American  Heart Association: www.heart.org Summary  The DASH eating plan is a healthy eating plan that has been shown to reduce high blood pressure (hypertension). It may also reduce your risk for type 2 diabetes, heart disease, and stroke.  With the DASH eating plan, you should limit salt (sodium) intake to 2,300 mg a day. If you have hypertension, you may need to reduce your sodium intake to 1,500 mg a day.  When on the DASH eating plan, aim to eat more fresh fruits and vegetables, whole grains, lean proteins, low-fat dairy, and heart-healthy fats.  Work with your health care provider or diet and nutrition specialist (dietitian) to adjust your eating plan to your individual calorie needs. This information is not intended to replace advice given to you by your health care provider. Make sure you discuss any questions you have with your health care provider. Document Revised: 03/01/2017 Document Reviewed: 03/12/2016 Elsevier Patient Education  2020 [employer].

## 2019-08-06 NOTE — Progress Notes (Signed)
Chief Complaint  Patient presents with  . 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 He will try exercises on his own has trouble with fine motor in right hand. He is on testosterone therapy will check with urology to see if this should be continued as this is 2nd stroke. He is s/p TPA and has a implantable loop recorder and had f/u Dr. Clayborn Bigness 08/05/19  LDL 75 and A1C 8.1 08/03/19 on lipitor 80, amaryl 2 mg qd, metformin 850, januvia 100 diet noncompliant at times but trying to change and goal weight dwon from 260 to 234 and wants to lose 25 more lbs  Echo EF 65-70% grade 1 DD mild mitral valve regurgitation   ED and low testosterone f/u with [facility] 09/2019   Review of Systems  Constitutional: Negative for weight loss.  HENT: Negative for hearing loss.   Eyes: Negative for blurred vision.  Respiratory: Negative for shortness of breath.   Cardiovascular: Negative for chest pain.  Gastrointestinal: Negative for abdominal pain.  Musculoskeletal: Negative for falls.  Skin: Negative for rash.  Neurological: Positive for dizziness, focal weakness and weakness.  Psychiatric/Behavioral: Negative for memory loss.   Past Medical History:  Diagnosis Date  . Alopecia 04/04/2011  . Diabetes mellitus   . Erectile dysfunction 04/04/2011  . Heart murmur   . Hyperlipidemia 04/04/2011  . Hypogonadism male 04/04/2011  . Myocardial infarction ([place])   . Stroke ([place])   . Substance abuse ([place])   . Type II or unspecified type diabetes mellitus without mention of complication, uncontrolled 04/04/2011  . Unspecified essential hypertension 04/04/2011   Past Surgical History:  Procedure Laterality Date  . BUBBLE STUDY  08/03/2019   Procedure: BUBBLE STUDY;  Surgeon: Fay Records, MD;  Location: [facility];   Service: Cardiovascular;;  . CARDIAC CATHETERIZATION    . LOOP RECORDER INSERTION N/A 08/03/2019   Procedure: LOOP RECORDER INSERTION;  Surgeon: Evans Lance, MD;  Location: [facility];  Service: Cardiovascular;  Laterality: N/A;  . TEE WITHOUT CARDIOVERSION N/A 08/03/2019   Procedure: TRANSESOPHAGEAL ECHOCARDIOGRAM (TEE);  Surgeon: Fay Records, MD;  Location: [facility] ENDOSCOPY;  Service: Cardiovascular;  Laterality: N/A;   Family History  Problem Relation Age of Onset  . Hypertension Father   . Dementia Father        died age 13 in 06-28-2017  . Memory loss Father   . Emphysema Mother   . COPD Mother   . Thyroid disease Mother   . Cancer Neg Hx   . Diabetes Neg Hx   . Heart disease Neg Hx   . Stroke Neg Hx    Social History   Socioeconomic History  . Marital status: Married    Spouse name: Not on file  . Number of children: Not on file  . Years of education: Not on file  . Highest education level: Not on file  Occupational History  . Occupation: OPERATIONS MANAGER of maintenance    Employer: [employer]  Tobacco Use  . Smoking status: Never Smoker  . Smokeless tobacco: Never Used  Substance and Sexual Activity  . Alcohol use: No    Comment: former etoh abuse in 21s   . Drug use: No  . Sexual activity: Yes  Other Topics Concern  . Not on file  Social History  Narrative   Caffienated drinks-no   Seat belt use often-yes   Regular Exercise-no   Smoke alarm in the home-yes   Firearms/guns in the home-yes   History of physical abuse-no      Married 2 kids daughter and son    Oldest of siblings has 2 brothers and 1 sister    Never smoker    Works Physicist, medical and [employer] wants to retire at 59 y.o. works in [employer] wife Vickie             Social Determinants of Radio broadcast assistant Strain:   . Difficulty of Paying Living Expenses:   Food Insecurity:   . Worried About Charity fundraiser in the Last Year:    . Arboriculturist in the Last Year:   Transportation Needs:   . Film/video editor (Medical):   Marland Kitchen Lack of Transportation (Non-Medical):   Physical Activity:   . Days of Exercise per Week:   . Minutes of Exercise per Session:   Stress:   . Feeling of Stress :   Social Connections:   . Frequency of Communication with Friends and Family:   . Frequency of Social Gatherings with Friends and Family:   . Attends Religious Services:   . Active Member of Clubs or Organizations:   . Attends Archivist Meetings:   Marland Kitchen Marital Status:   Intimate Partner Violence:   . Fear of Current or Ex-Partner:   . Emotionally Abused:   Marland Kitchen Physically Abused:   . Sexually Abused:    Current Meds  Medication Sig  . alprostadil (EDEX) 20 MCG injection 20 mcg by Intracavitary route as needed for erectile dysfunction. use no more than 3 times per week  . aspirin EC 81 MG tablet Take 1 tablet (81 mg total) by mouth daily. STOP TAKING after 21 days and continue PLAVIX ALONE  . atorvastatin (LIPITOR) 80 MG tablet Take 1 tablet (80 mg total) by mouth daily at 6 PM. (Patient taking differently: Take 80 mg by mouth daily. )  . clopidogrel (PLAVIX) 75 MG tablet Take 1 tablet (75 mg total) by mouth daily.  . Famotidine (PEPCID AC PO) Take 1-2 tablets by mouth daily.  Marland Kitchen glimepiride (AMARYL) 2 MG tablet Take 1 tablet (2 mg total) by mouth 2 (two) times daily with a meal.  . lisinopril (ZESTRIL) 40 MG tablet Take 1 tablet (40 mg total) by mouth daily.  . metFORMIN (GLUCOPHAGE) 850 MG tablet Take 1 tablet (850 mg total) by mouth daily with breakfast.  . metoprolol succinate (TOPROL-XL) 50 MG 24 hr tablet Take 1 tablet (50 mg total) by mouth daily. Take with or immediately following a meal.  . nitroGLYCERIN (NITROSTAT) 0.4 MG SL tablet Place 0.4 mg under the tongue every 5 (five) minutes as needed for chest pain.   Marland Kitchen omeprazole (PRILOSEC OTC) 20 MG tablet Take 20 mg by mouth daily as needed (acid  reflux/heartburn).  . sildenafil (VIAGRA) 100 MG tablet Take 0.5-1 tablets (50-100 mg total) by mouth daily as needed for erectile dysfunction. (Patient taking differently: Take 100 mg by mouth daily as needed for erectile dysfunction. )  . sitaGLIPtin (JANUVIA) 100 MG tablet Take 1 tablet (100 mg total) by mouth daily.  Marland Kitchen testosterone cypionate (DEPOTESTOSTERONE CYPIONATE) 200 MG/ML injection Inject 200 mg into the muscle every 14 (fourteen) days. Every other Sunday  . valACYclovir (VALTREX) 1000 MG  tablet Take 1-2 tablets (1,000-2,000 mg total) by mouth See admin instructions. Take 1-2 tablets (1000 -2000 mg) by mouth every 12 hours for 2-3 days as needed for fever blisters  . [DISCONTINUED] glimepiride (AMARYL) 2 MG tablet Take 1 tablet (2 mg total) by mouth daily with breakfast.  . [DISCONTINUED] lisinopril (ZESTRIL) 20 MG tablet Take 1 tablet (20 mg total) by mouth daily.   Allergies  Allergen Reactions  . Pravachol [Pravastatin Sodium] Other (See Comments)    Muscle aches   Recent Results (from the past 2160 hour(s))  MRSA PCR Screening     Status: None   Collection Time: 07/31/19  1:28 AM   Specimen: Nasal Mucosa; Nasopharyngeal  Result Value Ref Range   MRSA by PCR NEGATIVE NEGATIVE    Comment:        The GeneXpert MRSA Assay (FDA approved for NASAL specimens only), is one component of a comprehensive MRSA colonization surveillance program. It is not intended to diagnose MRSA infection nor to guide or monitor treatment for MRSA infections. Performed at [facility], [place] [address] [place], [place] [address]   I-stat chem 8, ED     Status: Abnormal   Collection Time: 07/31/19  7:38 PM  Result Value Ref Range   Sodium 136 135 - 145 mmol/L   Potassium 4.1 3.5 - 5.1 mmol/L   Chloride 100 98 - 111 mmol/L   BUN 17 6 - 20 mg/dL   Creatinine, Ser 1.20 0.61 - 1.24 mg/dL   Glucose, Bld 175 (H) 70 - 99 mg/dL    Comment: Glucose reference range applies only to samples taken  after fasting for at least 8 hours.   Calcium, Ion 1.13 (L) 1.15 - 1.40 mmol/L   TCO2 27 22 - 32 mmol/L   Hemoglobin 15.0 13.0 - 17.0 g/dL   HCT 44.0 39.0 - 52.0 %  Ethanol     Status: None   Collection Time: 07/31/19  7:38 PM  Result Value Ref Range   Alcohol, Ethyl (B) <10 <10 mg/dL    Comment: (NOTE) Lowest detectable limit for serum alcohol is 10 mg/dL. For medical purposes only. Performed at [facility], [place] [address], [place], [place] [address]   Protime-INR     Status: None   Collection Time: 07/31/19  7:38 PM  Result Value Ref Range   Prothrombin Time 12.5 11.4 - 15.2 seconds   INR 1.0 0.8 - 1.2    Comment: (NOTE) INR goal varies based on device and disease states. Performed at [facility], [place] [address], [place], [place] [address]   APTT     Status: None   Collection Time: 07/31/19  7:38 PM  Result Value Ref Range   aPTT 24 24 - 36 seconds    Comment: Performed at [facility] [address], [place], [place] [address]  CBC     Status: Abnormal   Collection Time: 07/31/19  7:38 PM  Result Value Ref Range   WBC 8.8 4.0 - 10.5 K/uL   RBC 4.85 4.22 - 5.81 MIL/uL   Hemoglobin 15.5 13.0 - 17.0 g/dL   HCT 45.2 39.0 - 52.0 %   MCV 93.2 80.0 - 100.0 fL   MCH 32.0 26.0 - 34.0 pg   MCHC 34.3 30.0 - 36.0 g/dL   RDW 11.9 11.5 - 15.5 %   Platelets 133 (L) 150 - 400 K/uL   nRBC 0.0 0.0 - 0.2 %    Comment:  Performed at [facility], [place] [address], [place], [place] [address]  Differential     Status: None   Collection Time: 07/31/19  7:38 PM  Result Value Ref Range   Neutrophils Relative % 69 %   Neutro Abs 6.2 1.7 - 7.7 K/uL   Lymphocytes Relative 19 %   Lymphs Abs 1.6 0.7 - 4.0 K/uL   Monocytes Relative 8 %   Monocytes Absolute 0.7 0.1 - 1.0 K/uL   Eosinophils Relative 3 %   Eosinophils Absolute 0.2 0.0 - 0.5 K/uL   Basophils Relative 1 %   Basophils Absolute 0.0 0.0 - 0.1 K/uL   Immature Granulocytes 0 %   Abs Immature Granulocytes  0.03 0.00 - 0.07 K/uL    Comment: Performed at [facility], [place] [address], [place], [place] [address]  Comprehensive metabolic panel     Status: Abnormal   Collection Time: 07/31/19  7:38 PM  Result Value Ref Range   Sodium 136 135 - 145 mmol/L   Potassium 4.2 3.5 - 5.1 mmol/L   Chloride 100 98 - 111 mmol/L   CO2 25 22 - 32 mmol/L   Glucose, Bld 190 (H) 70 - 99 mg/dL    Comment: Glucose reference range applies only to samples taken after fasting for at least 8 hours.   BUN 16 6 - 20 mg/dL   Creatinine, Ser 1.34 (H) 0.61 - 1.24 mg/dL   Calcium 9.4 8.9 - 10.3 mg/dL   Total Protein 6.4 (L) 6.5 - 8.1 g/dL   Albumin 4.0 3.5 - 5.0 g/dL   AST 26 15 - 41 U/L   ALT 35 0 - 44 U/L   Alkaline Phosphatase 79 38 - 126 U/L   Total Bilirubin 0.9 0.3 - 1.2 mg/dL   GFR calc non Af Amer 58 (L) >60 mL/min   GFR calc Af Amer >60 >60 mL/min   Anion gap 11 5 - 15    Comment: Performed at [facility] [address], [place], [place] [address]  SARS CORONAVIRUS 2 (TAT 6-24 HRS) Nasopharyngeal Nasopharyngeal Swab     Status: None   Collection Time: 07/31/19 10:55 PM   Specimen: Nasopharyngeal Swab  Result Value Ref Range   SARS Coronavirus 2 NEGATIVE NEGATIVE    Comment: (NOTE) SARS-CoV-2 target nucleic acids are NOT DETECTED. The SARS-CoV-2 RNA is generally detectable in upper and lower respiratory specimens during the acute phase of infection. Negative results do not preclude SARS-CoV-2 infection, do not rule out co-infections with other pathogens, and should not be used as the sole basis for treatment or other patient management decisions. Negative results must be combined with clinical observations, patient history, and epidemiological information. The expected result is Negative. Fact Sheet for Patients: SugarRoll.be Fact Sheet for Healthcare Providers: https://www.woods-mathews.com/ This test is not yet approved or cleared by the [place] FDA and  has been authorized for detection and/or diagnosis of SARS-CoV-2 by FDA under an Emergency Use Authorization (EUA). This EUA will remain  in effect (meaning this test can be used) for the duration of the COVID-19 declaration under Section 56 4(b)(1) of the Act, 21 U.S.C. section 360bbb-3(b)(1), unless the authorization is terminated or revoked sooner. Performed at [facility], [place] [address], [place],  [address]   Urine rapid drug screen (hosp performed)     Status: None   Collection Time: 07/31/19 11:30 PM  Result Value Ref Range   Opiates NONE DETECTED NONE DETECTED  Cocaine NONE DETECTED NONE DETECTED   Benzodiazepines NONE DETECTED NONE DETECTED   Amphetamines NONE DETECTED NONE DETECTED   Tetrahydrocannabinol NONE DETECTED NONE DETECTED   Barbiturates NONE DETECTED NONE DETECTED    Comment: (NOTE) DRUG SCREEN FOR MEDICAL PURPOSES ONLY.  IF CONFIRMATION IS NEEDED FOR ANY PURPOSE, NOTIFY LAB WITHIN 5 DAYS. LOWEST DETECTABLE LIMITS FOR URINE DRUG SCREEN Drug Class                     Cutoff (ng/mL) Amphetamine and metabolites    1000 Barbiturate and metabolites    200 Benzodiazepine                 275 Tricyclics and metabolites     300 Opiates and metabolites        300 Cocaine and metabolites        300 THC                            50 Performed at [facility], [place] [address] [place], [place] [address]   Urinalysis, Routine w reflex microscopic     Status: Abnormal   Collection Time: 07/31/19 11:30 PM  Result Value Ref Range   Color, Urine COLORLESS (A) YELLOW   APPearance CLEAR CLEAR   Specific Gravity, Urine 1.011 1.005 - 1.030   pH 7.0 5.0 - 8.0   Glucose, UA >=500 (A) NEGATIVE mg/dL   Hgb urine dipstick NEGATIVE NEGATIVE   Bilirubin Urine NEGATIVE NEGATIVE   Ketones, ur 5 (A) NEGATIVE mg/dL   Protein, ur NEGATIVE NEGATIVE mg/dL   Nitrite NEGATIVE NEGATIVE   Leukocytes,Ua NEGATIVE NEGATIVE   WBC, UA 0-5 0 - 5 WBC/hpf    Bacteria, UA NONE SEEN NONE SEEN    Comment: Performed at [facility] [address], [place], [place] [address]  Glucose, capillary     Status: Abnormal   Collection Time: 07/31/19 11:41 PM  Result Value Ref Range   Glucose-Capillary 242 (H) 70 - 99 mg/dL    Comment: Glucose reference range applies only to samples taken after fasting for at least 8 hours.  Hemoglobin A1c     Status: Abnormal   Collection Time: 08/01/19  2:35 AM  Result Value Ref Range   Hgb A1c MFr Bld 8.1 (H) 4.8 - 5.6 %    Comment: (NOTE) Pre diabetes:          5.7%-6.4% Diabetes:              >6.4% Glycemic control for   <7.0% adults with diabetes    Mean Plasma Glucose 185.77 mg/dL    Comment: Performed at [facility] [address] [place], [place] [address]  Lipid panel     Status: Abnormal   Collection Time: 08/01/19  2:35 AM  Result Value Ref Range   Cholesterol 130 0 - 200 mg/dL   Triglycerides 109 <150 mg/dL   HDL 33 (L) >40 mg/dL   Total CHOL/HDL Ratio 3.9 RATIO   VLDL 22 0 - 40 mg/dL   LDL Cholesterol 75 0 - 99 mg/dL    Comment:        Total Cholesterol/HDL:CHD Risk Coronary Heart Disease Risk Table                     Men   Women  1/2 Average Risk   3.4   3.3  Average Risk  5.0   4.4  2 X Average Risk   9.6   7.1  3 X Average Risk  23.4   11.0        Use the calculated Patient Ratio above and the CHD Risk Table to determine the patient's CHD Risk.        ATP III CLASSIFICATION (LDL):  <100     mg/dL   Optimal  100-129  mg/dL   Near or Above                    Optimal  130-159  mg/dL   Borderline  160-189  mg/dL   High  >190     mg/dL   Very High Performed at [facility] [address] [place], [place] [address]   Basic metabolic panel     Status: Abnormal   Collection Time: 08/01/19  2:35 AM  Result Value Ref Range   Sodium 136 135 - 145 mmol/L   Potassium 4.2 3.5 - 5.1 mmol/L   Chloride 103 98 - 111 mmol/L   CO2 24 22 - 32 mmol/L   Glucose, Bld 235  (H) 70 - 99 mg/dL    Comment: Glucose reference range applies only to samples taken after fasting for at least 8 hours.   BUN 13 6 - 20 mg/dL   Creatinine, Ser 1.20 0.61 - 1.24 mg/dL   Calcium 9.3 8.9 - 10.3 mg/dL   GFR calc non Af Amer >60 >60 mL/min   GFR calc Af Amer >60 >60 mL/min   Anion gap 9 5 - 15    Comment: Performed at [facility] [address], [place], [place] [address]  HIV Antibody (routine testing w rflx)     Status: None   Collection Time: 08/01/19  2:35 AM  Result Value Ref Range   HIV Screen 4th Generation wRfx Non Reactive Non Reactive    Comment: Performed at [facility], [place] [address], [place], [place] [address]  CBC     Status: Abnormal   Collection Time: 08/01/19  2:35 AM  Result Value Ref Range   WBC 11.9 (H) 4.0 - 10.5 K/uL   RBC 4.92 4.22 - 5.81 MIL/uL   Hemoglobin 16.1 13.0 - 17.0 g/dL   HCT 45.3 39.0 - 52.0 %   MCV 92.1 80.0 - 100.0 fL   MCH 32.7 26.0 - 34.0 pg   MCHC 35.5 30.0 - 36.0 g/dL   RDW 12.0 11.5 - 15.5 %   Platelets 145 (L) 150 - 400 K/uL   nRBC 0.0 0.0 - 0.2 %    Comment: Performed at [facility], [place] [address] [place], [place] [address]  Glucose, capillary     Status: Abnormal   Collection Time: 08/01/19  3:27 AM  Result Value Ref Range   Glucose-Capillary 194 (H) 70 - 99 mg/dL    Comment: Glucose reference range applies only to samples taken after fasting for at least 8 hours.  Glucose, capillary     Status: Abnormal   Collection Time: 08/01/19  8:19 AM  Result Value Ref Range   Glucose-Capillary 178 (H) 70 - 99 mg/dL    Comment: Glucose reference range applies only to samples taken after fasting for at least 8 hours.  Glucose, capillary     Status: Abnormal   Collection Time: 08/01/19 11:10 AM  Result Value Ref Range   Glucose-Capillary 279 (H) 70 - 99 mg/dL    Comment:  Glucose reference range applies only to samples taken after fasting for at least 8 hours.  Glucose, capillary     Status: Abnormal    Collection Time: 08/01/19  1:02 PM  Result Value Ref Range   Glucose-Capillary 262 (H) 70 - 99 mg/dL    Comment: Glucose reference range applies only to samples taken after fasting for at least 8 hours.  ECHOCARDIOGRAM COMPLETE     Status: None   Collection Time: 08/01/19  3:32 PM  Result Value Ref Range   Weight 3,848.35 oz   Height 72 in   BP 139/79 mmHg  Glucose, capillary     Status: Abnormal   Collection Time: 08/01/19  4:06 PM  Result Value Ref Range   Glucose-Capillary 231 (H) 70 - 99 mg/dL    Comment: Glucose reference range applies only to samples taken after fasting for at least 8 hours.  Glucose, capillary     Status: Abnormal   Collection Time: 08/01/19  8:24 PM  Result Value Ref Range   Glucose-Capillary 218 (H) 70 - 99 mg/dL    Comment: Glucose reference range applies only to samples taken after fasting for at least 8 hours.   Comment 1 Notify RN    Comment 2 Document in Chart   Basic metabolic panel     Status: Abnormal   Collection Time: 08/02/19  3:26 AM  Result Value Ref Range   Sodium 138 135 - 145 mmol/L   Potassium 3.9 3.5 - 5.1 mmol/L   Chloride 106 98 - 111 mmol/L   CO2 23 22 - 32 mmol/L   Glucose, Bld 203 (H) 70 - 99 mg/dL    Comment: Glucose reference range applies only to samples taken after fasting for at least 8 hours.   BUN 18 6 - 20 mg/dL   Creatinine, Ser 1.45 (H) 0.61 - 1.24 mg/dL   Calcium 8.9 8.9 - 10.3 mg/dL   GFR calc non Af Amer 52 (L) >60 mL/min   GFR calc Af Amer >60 >60 mL/min   Anion gap 9 5 - 15    Comment: Performed at [facility] [address] [place], [place] [address]  CBC     Status: Abnormal   Collection Time: 08/02/19  3:26 AM  Result Value Ref Range   WBC 10.1 4.0 - 10.5 K/uL   RBC 4.84 4.22 - 5.81 MIL/uL   Hemoglobin 15.5 13.0 - 17.0 g/dL   HCT 45.3 39.0 - 52.0 %   MCV 93.6 80.0 - 100.0 fL   MCH 32.0 26.0 - 34.0 pg   MCHC 34.2 30.0 - 36.0 g/dL   RDW 12.1 11.5 - 15.5 %   Platelets 139 (L) 150 - 400 K/uL    nRBC 0.0 0.0 - 0.2 %    Comment: Performed at [facility], [place] [address] [place], [place] [address]  Glucose, capillary     Status: Abnormal   Collection Time: 08/02/19  6:18 AM  Result Value Ref Range   Glucose-Capillary 178 (H) 70 - 99 mg/dL    Comment: Glucose reference range applies only to samples taken after fasting for at least 8 hours.   Comment 1 Notify RN    Comment 2 Document in Chart   Glucose, capillary     Status: Abnormal   Collection Time: 08/02/19 12:06 PM  Result Value Ref Range   Glucose-Capillary 198 (H) 70 - 99 mg/dL    Comment: Glucose reference range applies only to samples taken  after fasting for at least 8 hours.  Glucose, capillary     Status: Abnormal   Collection Time: 08/02/19  4:19 PM  Result Value Ref Range   Glucose-Capillary 297 (H) 70 - 99 mg/dL    Comment: Glucose reference range applies only to samples taken after fasting for at least 8 hours.  Glucose, capillary     Status: Abnormal   Collection Time: 08/02/19  8:00 PM  Result Value Ref Range   Glucose-Capillary 311 (H) 70 - 99 mg/dL    Comment: Glucose reference range applies only to samples taken after fasting for at least 8 hours.  Glucose, capillary     Status: Abnormal   Collection Time: 08/03/19  6:05 AM  Result Value Ref Range   Glucose-Capillary 189 (H) 70 - 99 mg/dL    Comment: Glucose reference range applies only to samples taken after fasting for at least 8 hours.  Basic metabolic panel     Status: Abnormal   Collection Time: 08/03/19  7:21 AM  Result Value Ref Range   Sodium 137 135 - 145 mmol/L   Potassium 4.4 3.5 - 5.1 mmol/L   Chloride 107 98 - 111 mmol/L   CO2 21 (L) 22 - 32 mmol/L   Glucose, Bld 200 (H) 70 - 99 mg/dL    Comment: Glucose reference range applies only to samples taken after fasting for at least 8 hours.   BUN 18 6 - 20 mg/dL   Creatinine, Ser 1.20 0.61 - 1.24 mg/dL   Calcium 8.6 (L) 8.9 - 10.3 mg/dL   GFR calc non Af Amer >60 >60 mL/min   GFR calc  Af Amer >60 >60 mL/min   Anion gap 9 5 - 15    Comment: Performed at [facility] [address], [place], [place] [address]  CBC     Status: Abnormal   Collection Time: 08/03/19  7:21 AM  Result Value Ref Range   WBC 8.9 4.0 - 10.5 K/uL   RBC 4.80 4.22 - 5.81 MIL/uL   Hemoglobin 15.8 13.0 - 17.0 g/dL   HCT 44.9 39.0 - 52.0 %   MCV 93.5 80.0 - 100.0 fL   MCH 32.9 26.0 - 34.0 pg   MCHC 35.2 30.0 - 36.0 g/dL   RDW 12.0 11.5 - 15.5 %   Platelets 141 (L) 150 - 400 K/uL   nRBC 0.0 0.0 - 0.2 %    Comment: Performed at [facility], [place] [address], [place], [place] [address]   Objective  Body mass index is 31.82 kg/m. Wt Readings from Last 3 Encounters:  08/06/19 234 lb 9.6 oz (106.4 kg)  08/03/19 240 lb 8.4 oz (109.1 kg)  02/24/19 235 lb (106.6 kg)   Temp Readings from Last 3 Encounters:  08/06/19 (!) 97.3 F (36.3 C) (Temporal)  08/03/19 (!) 97.5 F (36.4 C) (Axillary)  03/20/18 97.7 F (36.5 C) (Oral)   BP Readings from Last 3 Encounters:  08/06/19 130/86  08/03/19 104/67  03/20/18 122/78   Pulse Readings from Last 3 Encounters:  08/06/19 61  08/03/19 (!) 58  03/20/18 74    Physical Exam Vitals and nursing note reviewed.  Constitutional:      Appearance: Normal appearance. He is well-developed and well-groomed. He is obese.  HENT:     Head: Normocephalic and atraumatic.     Ears:     Comments: Cerumen impaction b/l ears     Mouth/Throat:     Mouth: Mucous membranes  are moist.     Pharynx: Oropharynx is clear.  Eyes:     Conjunctiva/sclera: Conjunctivae normal.     Pupils: Pupils are equal, round, and reactive to light.  Cardiovascular:     Rate and Rhythm: Normal rate and regular rhythm.     Heart sounds: Normal heart sounds. No murmur.  Pulmonary:     Effort: Pulmonary effort is normal.     Breath sounds: Normal breath sounds.  Skin:    General: Skin is warm and dry.  Neurological:     General: No focal deficit present.     Mental  Status: He is alert and oriented to person, place, and time. Mental status is at baseline.     Gait: Gait normal.     Comments: Reduced coordination right hand finger tapping slow 5/5 strength upper and lower ext b/l   Psychiatric:        Attention and Perception: Attention and perception normal.        Mood and Affect: Mood and affect normal.        Speech: Speech normal.        Behavior: Behavior normal. Behavior is cooperative.        Thought Content: Thought content normal.        Cognition and Memory: Cognition and memory normal.        Judgment: Judgment normal.     Assessment  Plan  Cerebrovascular accident (CVA) due to embolism of cerebral artery ([place]) - Plan: Ambulatory referral to Neurology, Lipid panel, Hemoglobin A1c Cont aspirin/plavix  Control risks factors  lipitor 80 add zetia 10  Increase amaryl 2 mg qd to bid  Repeat fasting labs in 3 months F/u neurology  Hyperlipidemia, unspecified hyperlipidemia type - Plan: ezetimibe (ZETIA) 10 MG tablet, CBC with Differential/Platelet  Essential hypertension - Plan: lisinopril (ZESTRIL) 40 MG tablet, Comprehensive metabolic panel, Lipid panel, CBC with Differential/Platelet  Bilateral carotid artery stenosis - Plan: Ambulatory referral to Neurology Intracranial atherosclerosis - Plan: Ambulatory referral to Neurology F/u neurology [facility] Dr. Erlinda Hong  Uncontrolled type 2 diabetes mellitus with hyperglycemia ([place]) - Plan: glimepiride (AMARYL) 2 MG tablet, blood glucose meter kit and supplies, Continuous Blood Gluc Receiver (FREESTYLE LIBRE 14 DAY READER) DEVI, Continuous Blood Gluc Sensor (FREESTYLE LIBRE 14 DAY SENSOR) MISC, Lipid panel, Hemoglobin A1c Cont meds increased amaryl 2 mg bid  In 3 months if not controlled add ozempic d/c januvia reduce amaryl  Erectile dysfunction, unspecified erectile dysfunction type - Plan: Ambulatory referral to Urology Hypotestosteronism - Plan: Ambulatory referral to Urology Ask neurology and  urology about test tx.   Elevated TSH - Plan: TSH, T4, free, T3, free, Thyroid peroxidase antibody  B12 deficiency - Plan: B12  Vitamin D deficiency - Plan: Vitamin D (25 hydroxy)   HM Flu shot at work 12/2018  tdap had 06/25/15 Disc shingrix today to consider in the future wants to wait until covid 19 vaccine comes out and he gest this  pna 23 had 04/05/14 due 04/2019  Pfizer 2/2 had    PSA, ED, low T with Dr. Tonette Bihari [facility] saw w/in the last few months 2020 get records 09/15/18 [facility] appt low T PSA 09/15/18 1.28, T was 1003.6 -on testosterone Q1-2 weeks per urology  -appt 09/2019  Colonoscopy 02/04/15 Dr. Melina Copa diverticulosis f/u in 5 years [place] due 02/2020   Hep C negative    Dermatology Dr. Nehemiah Massed due to f/u in 10 or 02/2019 pt will call for appt as of 02/24/2019  Never smoker  Former etoh abuse in 29s but quit in 46s   rec healthy diet and exercise Provider: Dr. Olivia Mackie [facility]-Scocuzza-[facility]

## 2019-08-07 ENCOUNTER — Telehealth: Payer: Self-pay | Admitting: Internal Medicine

## 2019-08-07 NOTE — Telephone Encounter (Signed)
Fax the encounter to [facility] Dr. Diona Fanti    Thanks [employer]

## 2019-08-07 NOTE — Telephone Encounter (Signed)
Faxed via epic routing to [phone]

## 2019-08-07 NOTE — Addendum Note (Signed)
Addended by: Orland Mustard on: 08/07/2019 07:31 PM   Modules accepted: Orders

## 2019-08-07 NOTE — Telephone Encounter (Signed)
-----   Message from Rosalin Hawking, MD sent at 08/07/2019 12:21 AM EDT ----- Thank you for pointing out this important issue. There are studies that testosterone use increases heart attack and stroke but the overall evidence is still inconclusive. Endocrine Society recommends to avoid testosterone within 6 months of MI or stroke. I would agree to stop testosterone for at least 6 months, but I would defer to Dr. Diona Fanti. Thanks for taking care of the patient. - [place] ----- Message ----- From: McLean-Scocuzza, Nino Glow, MD Sent: 08/06/2019  10:10 AM EDT To: Rosalin Hawking, MD  Fax note to [facility] embolic stroke on testosterone 2nd stroke how does neurology and [facility] feel about this Dr. Cipriano Bunker?

## 2019-08-12 ENCOUNTER — Telehealth: Payer: Self-pay | Admitting: Internal Medicine

## 2019-08-12 NOTE — Telephone Encounter (Signed)
-----   Message from Delorise Jackson, MD sent at 08/06/2019 10:10 AM EDT ----- Fax note to [facility] embolic stroke on testosterone 2nd stroke how does neurology and [facility] feel about this Dr. Cipriano Bunker?

## 2019-08-12 NOTE — Telephone Encounter (Signed)
Faxed via Epic Routing to Jorja Loa, M.D.

## 2019-08-12 NOTE — Telephone Encounter (Signed)
-----   Message from Delorise Jackson, MD sent at 08/07/2019  8:26 AM EDT ----- Fax encounter to Dr. Huston Foley [facility]   Thanks [place] ----- Message ----- From: Rosalin Hawking, MD Sent: 08/07/2019  12:21 AM EDT To: Nino Glow McLean-Scocuzza, MD  Thank you for pointing out this important issue. There are studies that testosterone use increases heart attack and stroke but the overall evidence is still inconclusive. Endocrine Society recommends to avoid testosterone within 6 months of MI or stroke. I would agree to stop testosterone for at least 6 months, but I would defer to Dr. Diona Fanti. Thanks for taking care of the patient. - Jindong ----- Message ----- From: McLean-Scocuzza, Nino Glow, MD Sent: 08/06/2019  10:10 AM EDT To: Rosalin Hawking, MD  Fax note to [facility] embolic stroke on testosterone 2nd stroke how does neurology and [facility] feel about this Dr. Cipriano Bunker?

## 2019-08-12 NOTE — Telephone Encounter (Signed)
Faxed via Epic routing to [phone]

## 2019-08-13 ENCOUNTER — Other Ambulatory Visit: Payer: Self-pay

## 2019-08-13 ENCOUNTER — Encounter: Payer: Self-pay | Admitting: Internal Medicine

## 2019-08-13 ENCOUNTER — Ambulatory Visit (INDEPENDENT_AMBULATORY_CARE_PROVIDER_SITE_OTHER): Payer: 59 | Admitting: Emergency Medicine

## 2019-08-13 DIAGNOSIS — Z95818 Presence of other cardiac implants and grafts: Secondary | ICD-10-CM

## 2019-08-13 DIAGNOSIS — I6340 Cerebral infarction due to embolism of unspecified cerebral artery: Secondary | ICD-10-CM

## 2019-08-13 LAB — CUP PACEART INCLINIC DEVICE CHECK
Date Time Interrogation Session: 20210513115801
Implantable Pulse Generator Implant Date: 20210503

## 2019-08-13 NOTE — Progress Notes (Signed)
ILR wound check in clinic. Steri strips removed by patient prior to visit. Incision well healed. No episodes. Patient educated about wound care and Carelink monitor. Patient to call the [facility] with monitor SN as it has not been assigned in [place]. Monthly summary reports and ROV with Dr. Lovena Le PRN.

## 2019-08-13 NOTE — Patient Instructions (Signed)
Do not come to the office for monthly "remote pacer check" appointments.  These are automatically sent to [place] through your app.  Please call the [facility] at [phone] with any questions.

## 2019-08-13 NOTE — Progress Notes (Signed)
Script for Blood glucose meter and kit faxed to [employer], [phone]  Confirmation received 08/06/19 3:25 pm.

## 2019-08-14 ENCOUNTER — Encounter: Payer: Self-pay | Admitting: *Deleted

## 2019-08-24 ENCOUNTER — Telehealth: Payer: Self-pay | Admitting: Internal Medicine

## 2019-08-24 NOTE — Telephone Encounter (Signed)
Patient wanting to know if he should take Zetia and Atorvastatin. Please advise

## 2019-08-24 NOTE — Telephone Encounter (Signed)
Goal LDL <60   So yes to both   [place]

## 2019-08-24 NOTE — Telephone Encounter (Signed)
Patient informed and verbalized understanding

## 2019-08-24 NOTE — Telephone Encounter (Signed)
Patient called and wanted to know if he should continue taking both cholesterol medications.

## 2019-08-25 ENCOUNTER — Other Ambulatory Visit: Payer: Self-pay | Admitting: Internal Medicine

## 2019-08-26 ENCOUNTER — Ambulatory Visit: Payer: 59 | Admitting: Internal Medicine

## 2019-08-27 ENCOUNTER — Ambulatory Visit: Payer: 59 | Admitting: Internal Medicine

## 2019-09-03 ENCOUNTER — Encounter: Payer: Self-pay | Admitting: Adult Health

## 2019-09-03 ENCOUNTER — Ambulatory Visit: Payer: 59 | Admitting: Adult Health

## 2019-09-03 ENCOUNTER — Inpatient Hospital Stay: Payer: 59 | Admitting: Adult Health

## 2019-09-03 ENCOUNTER — Other Ambulatory Visit: Payer: Self-pay

## 2019-09-03 VITALS — BP 129/79 | HR 66 | Ht 72.0 in | Wt 234.0 lb

## 2019-09-03 DIAGNOSIS — E785 Hyperlipidemia, unspecified: Secondary | ICD-10-CM

## 2019-09-03 DIAGNOSIS — E1165 Type 2 diabetes mellitus with hyperglycemia: Secondary | ICD-10-CM

## 2019-09-03 DIAGNOSIS — I639 Cerebral infarction, unspecified: Secondary | ICD-10-CM

## 2019-09-03 DIAGNOSIS — I10 Essential (primary) hypertension: Secondary | ICD-10-CM

## 2019-09-03 NOTE — Patient Instructions (Signed)
Continue aspirin 81 mg daily and clopidogrel 75 mg daily  and atorvastatin 80mg   for secondary stroke prevention  Continue to follow up with PCP regarding cholesterol, blood pressure and diabetes management   Continue to follow with cardiology as scheduled  Continue to monitor blood pressure at home  Maintain strict control of hypertension with blood pressure goal below 130/90, diabetes with hemoglobin A1c goal below 6.5% and cholesterol with LDL cholesterol (bad cholesterol) goal below 70 mg/dL. I also advised the patient to eat a healthy diet with plenty of whole grains, cereals, fruits and vegetables, exercise regularly and maintain ideal body weight.  Followup in the future with me in 4 months or call earlier if needed       Thank you for coming to see [place] at [facility]. I hope we have been able to provide you high quality care today.  You may receive a patient satisfaction survey over the next few weeks. We would appreciate your feedback and comments so that we may continue to improve ourselves and the health of our patients.

## 2019-09-03 NOTE — Progress Notes (Signed)
[facility] [address] [place]. [place] [address] [phone]       [place]  Mr. Maverik Foot Karl Morgan Date of Birth:  Jun 08, 1960 Medical Record Number:  [id number]   Reason for Referral:  hospital stroke follow up    SUBJECTIVE:   CHIEF COMPLAINT:  Chief Complaint  Patient presents with  . Follow-up    Tx rm here for a f/u from a stoke. Pt is still getting dizzy spells.    HPI:   [name] a 59 y.o.malewith history of a previous stroke, DM, CAD with previous MI, hx of substance abuse and hyperlipidemia, who presented on 07/31/2019 with acute dizziness, "head buzzing",and gait instability which started abruptly shortly after 6 PM.  Stroke work-up revealed acute infarct right superior cerebellum infarct with superimposed hemorrhagic transformation within the infarct -recurrent cryptogenic infarcts on DAPT.  Loop recorder placed to monitor for atrial fibrillation.  Recommended DAPT for 3 weeks and Plavix alone.  History of HTN with long-term BP goal normotensive range.  LDL 75 and recommend continuation of atorvastatin 80 mg daily.  Uncontrolled DM with A1c 8.7.  Other stroke risk factors include history of stroke cryptogenic 02/2015, obesity and CAD.  He was discharged home in stable condition.  Stroke:Acute infarct right superior cerebellum infarct w/ superimposed hemorrhagic transformation within the infarct - recurrent cryptogenic infarcts on DAPT   Resultant symptoms resolved  Code Stroke CT Head - Right frontal hypodensity most likely a small chronic infarct. Negative for hemorrhage. ASPECTS is 10   CTA H&N - Mild atherosclerotic disease at the carotid bifurcation without significant carotid stenosis in the neck. 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 No intracranial large vessel occlusion or flow limiting stenosis.   MRI head -08/01/19 -Acute infarct right superior cerebellum. Superimposed hemorrhage is present within the infarct. Mild local mass-effect without hydrocephalus.  CT Head - 08/02/19- Unchanged small area of central hemorrhage within the right cerebellar infarct. No new site of hemorrhage or ischemia  2D Echo- EF 65- 70%. No cardiac source of emboli identified.  TEE (no PFO, mild thoracic aorta atherosclerosis, LV fxn mildly reduced)  loop recorderplacement 08/03/2019   Lacey Jensen Virus 2 - negative  LDL - 75  HgbA1c- 8.7  UDS -neg  aspirin 81 mg daily and clopidogrel 75 mg dailyprior to admission, now on no antithromboticgiven small hemorrhagic conversion. Resume DAPT on discharge for 3 weeks and then plavix alone.  Therapy recommendations:None  Disposition: return home  Today, 09/03/2019, [name] is being seen today hospital follow up. He has been doing well since discharge with residual blurred vision and dizziness/imbalance. Symptoms will fluctuate but does endorse ongoing improvement.  He did not participate in any therapies and does not feel as though this is needed.  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  He has continued on aspirin and Plavix which was advised by cardiology to continue on DAPT.  Blood pressure stable at home with today's level 129/79.  Glucose levels have been ranging from 150-229 currently working with PCP for ongoing management.  Loop recorder has not shown atrial fibrillation thus far.  He questions safety of testosterone as he was previously receiving weekly injections but PCP discontinued due to stroke risk concerns.  No further concerns at this time.    ROS:   14  system review of systems performed and negative with exception of dizziness  PMH:  Past Medical History:  Diagnosis Date  . Alopecia 04/04/2011  . Diabetes mellitus   . Erectile dysfunction 04/04/2011  . Heart murmur    . Hyperlipidemia 04/04/2011  . Hypogonadism male 04/04/2011  . Myocardial infarction ([place])   . Stroke ([place])   . Substance abuse ([place])   . Type II or unspecified type diabetes mellitus without mention of complication, uncontrolled 04/04/2011  . Unspecified essential hypertension 04/04/2011    PSH:  Past Surgical History:  Procedure Laterality Date  . BUBBLE STUDY  08/03/2019   Procedure: BUBBLE STUDY;  Surgeon: Fay Records, MD;  Location: [facility];  Service: Cardiovascular;;  . CARDIAC CATHETERIZATION    . LOOP RECORDER INSERTION N/A 08/03/2019   Procedure: LOOP RECORDER INSERTION;  Surgeon: Evans Lance, MD;  Location: [facility];  Service: Cardiovascular;  Laterality: N/A;  . TEE WITHOUT CARDIOVERSION N/A 08/03/2019   Procedure: TRANSESOPHAGEAL ECHOCARDIOGRAM (TEE);  Surgeon: Fay Records, MD;  Location[facility] END[facility]COPY;  Service: Cardiovascular;  Laterality: N/A;    Social History:  Social History   Socioeconomic History  . Marital status: Married    Spouse name: Not on file  . Number of children: Not on file  . Years of education: Not on file  . Highest education level: Not on file  Occupational History  . Occupation: OPERATIONS MANAGER of maintenance    Employer: [employer]  Tobacco Use  . Smoking status: Never Smoker  . Smokeless tobacco: Never Used  Substance and Sexual Activity  . Alcohol use: No    Comment: former etoh abuse in 64s   . Drug use: No  . Sexual activity: Yes  Other Topics Concern  . Not on file  Social History Narrative   Caffienated drinks-no   Seat belt use often-yes   Regular Exercise-no   Smoke alarm in the home-yes   Firearms/guns in the home-yes   History of physical abuse-no      Married 2 kids daughter and son    Oldest of siblings has 2 brothers and 1 sister    Never smoker    Works Physicist, medical and [place] wants to retire at 59 y.o. works in Education administrator       DPR wife Karl Morgan              Social Determinants of Radio broadcast assistant Strain:   . Difficulty of Paying Living Expenses:   Food Insecurity:   . Worried About Charity fundraiser in the Last Year:   . Arboriculturist in the Last Year:   Transportation Needs:   . Film/video editor (Medical):   Marland Kitchen Lack of Transportation (Non-Medical):   Physical Activity:   . Days of Exercise per Week:   . Minutes of Exercise per Session:   Stress:   . Feeling of Stress :   Social Connections:   . Frequency of Communication with Friends and Family:   . Frequency of Social Gatherings with Friends and Family:   . Attends Religious Services:   . Active Member of Clubs or Organizations:   . Attends Archivist Meetings:   Marland Kitchen Marital Status:   Intimate Partner Violence:   . Fear of Current or Ex-Partner:   . Emotionally Abused:   Marland Kitchen Physically Abused:   . Sexually Abused:     Family History:  Family History  Problem Relation Age of Onset  . Hypertension Father   . Dementia Father        died age 31 in 05-18-2017  . Memory loss Father   . Emphysema Mother   . COPD Mother   . Thyroid disease Mother   . Cancer Neg Hx   . Diabetes Neg Hx   . Heart disease Neg Hx   . Stroke Neg Hx     Medications:   Current Outpatient Medications on File Prior to Visit  Medication Sig Dispense Refill  . alprostadil (EDEX) 20 MCG injection 20 mcg by Intracavitary route as needed for erectile dysfunction. use no more than 3 times per week    . aspirin EC 81 MG tablet Take 1 tablet (81 mg total) by mouth daily. STOP TAKING after 21 days and continue PLAVIX ALONE 21 tablet 0  . atorvastatin (LIPITOR) 80 MG tablet Take 1 tablet (80 mg total) by mouth daily at 6 PM. (Patient taking differently: Take 80 mg by mouth daily. ) 90 tablet 3  . blood glucose meter kit and supplies Dispense based on patient and insurance preference. Use up to bid daily as directed. (FOR ICD-10 E10.9, E11.9). 1 year supply of lancets and strips 1 each 0  .  clopidogrel (PLAVIX) 75 MG tablet Take 1 tablet (75 mg total) by mouth daily.    . Continuous Blood Gluc Receiver (FREESTYLE LIBRE 14 DAY READER) DEVI 1 Device by Does not apply route in the morning and at bedtime. 2 each 11  . Continuous Blood Gluc Sensor (FREESTYLE LIBRE 14 DAY SENSOR) MISC 1 Device by Does not apply route in the morning and at bedtime. 2 each 11  . ezetimibe (ZETIA) 10 MG tablet Take 1 tablet (10 mg total) by mouth daily. 90 tablet 3  . glimepiride (AMARYL) 2 MG tablet Take 1 tablet (2 mg total) by mouth 2 (two) times daily with a meal. 180 tablet 3  . lisinopril (ZESTRIL) 40 MG tablet Take 1 tablet (40 mg total) by mouth daily. 90 tablet 3  . metFORMIN (GLUCOPHAGE) 850 MG tablet Take 1 tablet (850 mg total) by mouth daily with breakfast. 90 tablet 3  . metoprolol succinate (TOPROL-XL) 50 MG 24 hr tablet Take 1 tablet (50 mg total) by mouth daily. Take with or immediately following a meal. 90 tablet 3  . omeprazole (PRILOSEC OTC) 20 MG tablet Take 20 mg by mouth daily as needed (acid reflux/heartburn).    . sildenafil (VIAGRA) 100 MG tablet Take 0.5-1 tablets (50-100 mg total) by mouth daily as needed for erectile dysfunction. (Patient taking differently: Take 100 mg by mouth daily as needed for erectile dysfunction. ) 5 tablet 11  . sitaGLIPtin (JANUVIA) 100 MG tablet Take 1 tablet (100 mg total) by mouth daily. 90 tablet 3  . valACYclovir (VALTREX) 1000 MG tablet Take 1-2 tablets (1,000-2,000 mg total) by mouth See admin instructions. Take 1-2 tablets (1000 -2000 mg) by mouth every 12 hours for 2-3 days as needed for fever blisters     No current facility-administered medications on file prior to visit.    Allergies:   Allergies  Allergen Reactions  . Pravachol [Pravastatin Sodium] Other (See Comments)    Muscle aches      OBJECTIVE:  Physical Exam  Vitals:   09/03/19 1309  BP: 129/79  Pulse: 66  Weight: 234 lb (106.1 kg)  Height: 6' (1.829 m)   Body mass  index is 31.74 kg/m. No exam data present  General: well developed, well nourished,  pleasant middle-age Caucasian male, seated, in no evident distress Head: head normocephalic and atraumatic.   Neck: supple with no carotid or supraclavicular bruits Cardiovascular: regular rate and rhythm, no murmurs Musculoskeletal: no deformity Skin:  no rash/petichiae Vascular:  Normal pulses all extremities   Neurologic Exam Mental Status: Awake and fully alert.   Speech and language.  Oriented to place and time. Recent and remote memory intact. Attention span, concentration and fund of knowledge appropriate. Mood and affect appropriate.  Cranial Nerves: Fundoscopic exam reveals sharp disc margins. Pupils equal, briskly reactive to light. Extraocular movements full without nystagmus. Visual fields full to confrontation. Hearing intact. Facial sensation intact. Face, tongue, palate moves normally and symmetrically.  Motor: Normal bulk and tone. Normal strength in all tested extremity muscles. Sensory.: intact to touch , pinprick , position and vibratory sensation.  Coordination: Rapid alternating movements normal in all extremities. Finger-to-nose and heel-to-shin performed accurately bilaterally. Gait and Station: Arises from chair without difficulty. Stance is normal. Gait demonstrates normal stride length and balance Reflexes: 1+ and symmetric. Toes downgoing.     NIHSS  0 Modified Rankin  1     ASSESSMENT: Sylvanus F Karl Morgan is a 59 y.o. year old male presented with acute dizziness, "head buzzing" and gait instability on 07/31/2019 with stroke work-up revealing acute infarct right superior cerebellum s/p tPA with superimposed hemorrhagic transformation within the infarct, infarct secondary to unknown source.  Prior history of cryptogenic stroke 2016 and has continued on DAPT since that time.  Loop recorder placed to rule out atrial fibrillation.  Vascular risk factors include prior stroke, HTN, HLD,  DM, CAD w/ MI, and history of substance abuse.  Recovered well from a stroke standpoint with occasional dizziness/imbalance     PLAN:  1. Cryptogenic right cerebellum stroke: Continue aspirin 81 mg daily and clopidogrel 75 mg daily  and atorvastatin 80 mg daily for secondary stroke prevention.  Advised ongoing use of DAPT not indicated from a neurological standpoint and only single agent alone indicated.  Per patient, cardiology advised continuation of DAPT.  Loop recorder will continue to be monitored for atrial fibrillation.  Maintain strict control of hypertension with blood pressure goal below 130/90, diabetes with hemoglobin A1c goal below 6.5% and cholesterol with LDL cholesterol (bad cholesterol) goal below 70 mg/dL.  I also advised the patient to eat a healthy diet with plenty of whole grains, cereals, fruits and vegetables, exercise regularly with at least 30 minutes of continuous activity daily and maintain ideal body weight. 2. HTN: Stable.  Continue to follow with PCP for monitoring and management 3. HLD: Stable.  Continue to follow with PCP for monitoring management as well as prescribing of atorvastatin 4. DMII: Continue to follow with PCP for monitoring management 5. In regards to testosterone use, there is insufficient evidence regarding increased risk of stroke with use of testosterone.  Advised patient if testosterone use is for medical reason due to low levels, he may continue use for ongoing benefit.  This was further discussed with Dr. Leonie Man    Follow up in 4 months or call earlier if needed   I spent 45 minutes of face-to-face and non-face-to-face time with patient.  This included previsit chart review, lab review, study review, order entry, electronic health record documentation, patient education regarding recent stroke, residual deficits, importance of managing stroke risk factors and answered all questions to patient satisfaction     Frann Rider, AGNP-BC  [facility] [phone]  [place], [place] [address]  Phone [phone] Fax [phone] Note: This document was prepared with digital dictation and possible smart phrase technology. Any transcriptional errors that result from this process are unintentional.

## 2019-09-04 NOTE — Progress Notes (Signed)
I agree with the above plan 

## 2019-09-09 ENCOUNTER — Other Ambulatory Visit: Payer: Self-pay | Admitting: Internal Medicine

## 2019-09-09 MED ORDER — METOPROLOL SUCCINATE ER 50 MG PO TB24: 50.0000 mg | ORAL_TABLET | Freq: Every day | ORAL | 3 refills | Status: DC

## 2019-09-18 DIAGNOSIS — E291 Testicular hypofunction: Secondary | ICD-10-CM | POA: Diagnosis not present

## 2019-09-18 DIAGNOSIS — Z125 Encounter for screening for malignant neoplasm of prostate: Secondary | ICD-10-CM | POA: Diagnosis not present

## 2019-09-21 ENCOUNTER — Ambulatory Visit (INDEPENDENT_AMBULATORY_CARE_PROVIDER_SITE_OTHER): Payer: 59 | Admitting: *Deleted

## 2019-09-21 DIAGNOSIS — I6340 Cerebral infarction due to embolism of unspecified cerebral artery: Secondary | ICD-10-CM | POA: Diagnosis not present

## 2019-09-21 LAB — CUP PACEART REMOTE DEVICE CHECK
Date Time Interrogation Session: 20210621065403
Implantable Pulse Generator Implant Date: 20210503

## 2019-09-22 NOTE — Progress Notes (Signed)
Carelink Summary Report / Loop Recorder 

## 2019-09-23 DIAGNOSIS — E291 Testicular hypofunction: Secondary | ICD-10-CM | POA: Diagnosis not present

## 2019-10-23 DIAGNOSIS — E291 Testicular hypofunction: Secondary | ICD-10-CM | POA: Diagnosis not present

## 2019-10-26 ENCOUNTER — Ambulatory Visit (INDEPENDENT_AMBULATORY_CARE_PROVIDER_SITE_OTHER): Payer: 59 | Admitting: *Deleted

## 2019-10-26 DIAGNOSIS — I6340 Cerebral infarction due to embolism of unspecified cerebral artery: Secondary | ICD-10-CM | POA: Diagnosis not present

## 2019-10-26 LAB — CUP PACEART REMOTE DEVICE CHECK
Date Time Interrogation Session: 20210725230641
Implantable Pulse Generator Implant Date: 20210503

## 2019-10-27 NOTE — Progress Notes (Signed)
Carelink Summary Report / Loop Recorder 

## 2019-10-30 ENCOUNTER — Other Ambulatory Visit: Payer: Self-pay

## 2019-10-30 NOTE — Patient Outreach (Signed)
[facility]) Care Management  10/30/2019  Karl Morgan 1961/02/19 [id number]  First telephone outreach attempt to obtain mRs. No answer. Left message for returned call.  Ina Homes [facility] Management Assistant [phone]

## 2019-11-03 ENCOUNTER — Other Ambulatory Visit
Admission: RE | Admit: 2019-11-03 | Discharge: 2019-11-03 | Disposition: A | Discharge status: Home / Self Care | Payer: 59 | Source: Ambulatory Visit | Attending: Internal Medicine | Admitting: Internal Medicine

## 2019-11-03 DIAGNOSIS — E538 Deficiency of other specified B group vitamins: Secondary | ICD-10-CM | POA: Insufficient documentation

## 2019-11-03 DIAGNOSIS — E785 Hyperlipidemia, unspecified: Secondary | ICD-10-CM | POA: Insufficient documentation

## 2019-11-03 DIAGNOSIS — I10 Essential (primary) hypertension: Secondary | ICD-10-CM | POA: Diagnosis not present

## 2019-11-03 DIAGNOSIS — E118 Type 2 diabetes mellitus with unspecified complications: Secondary | ICD-10-CM | POA: Insufficient documentation

## 2019-11-03 DIAGNOSIS — E559 Vitamin D deficiency, unspecified: Secondary | ICD-10-CM | POA: Insufficient documentation

## 2019-11-03 DIAGNOSIS — R7989 Other specified abnormal findings of blood chemistry: Secondary | ICD-10-CM | POA: Diagnosis not present

## 2019-11-03 DIAGNOSIS — R946 Abnormal results of thyroid function studies: Secondary | ICD-10-CM

## 2019-11-03 LAB — CBC WITH DIFFERENTIAL/PLATELET
Abs Immature Granulocytes: 0.03 10*3/uL (ref 0.00–0.07)
Basophils Absolute: 0 10*3/uL (ref 0.0–0.1)
Basophils Relative: 0 %
Eosinophils Absolute: 0.2 10*3/uL (ref 0.0–0.5)
Eosinophils Relative: 2 %
HCT: 38 % — ABNORMAL LOW (ref 39.0–52.0)
Hemoglobin: 13.4 g/dL (ref 13.0–17.0)
Immature Granulocytes: 0 %
Lymphocytes Relative: 24 %
Lymphs Abs: 1.7 10*3/uL (ref 0.7–4.0)
MCH: 33.6 pg (ref 26.0–34.0)
MCHC: 35.3 g/dL (ref 30.0–36.0)
MCV: 95.2 fL (ref 80.0–100.0)
Monocytes Absolute: 0.8 10*3/uL (ref 0.1–1.0)
Monocytes Relative: 11 %
Neutro Abs: 4.7 10*3/uL (ref 1.7–7.7)
Neutrophils Relative %: 63 %
Platelets: 166 10*3/uL (ref 150–400)
RBC: 3.99 MIL/uL — ABNORMAL LOW (ref 4.22–5.81)
RDW: 12.1 % (ref 11.5–15.5)
WBC: 7.4 10*3/uL (ref 4.0–10.5)
nRBC: 0 % (ref 0.0–0.2)

## 2019-11-03 LAB — COMPREHENSIVE METABOLIC PANEL
ALT: 24 U/L (ref 0–44)
AST: 22 U/L (ref 15–41)
Albumin: 4.2 g/dL (ref 3.5–5.0)
Alkaline Phosphatase: 59 U/L (ref 38–126)
Anion gap: 8 (ref 5–15)
BUN: 21 mg/dL — ABNORMAL HIGH (ref 6–20)
CO2: 22 mmol/L (ref 22–32)
Calcium: 9.1 mg/dL (ref 8.9–10.3)
Chloride: 104 mmol/L (ref 98–111)
Creatinine, Ser: 1.28 mg/dL — ABNORMAL HIGH (ref 0.61–1.24)
GFR calc Af Amer: >60 mL/min (ref >60)
GFR calc non Af Amer: >60 mL/min (ref >60)
Glucose, Bld: 208 mg/dL — ABNORMAL HIGH (ref 70–99)
Potassium: 4.6 mmol/L (ref 3.5–5.1)
Sodium: 134 mmol/L — ABNORMAL LOW (ref 135–145)
Total Bilirubin: 1.2 mg/dL (ref 0.3–1.2)
Total Protein: 6.8 g/dL (ref 6.5–8.1)

## 2019-11-03 LAB — TSH: TSH: 7.388 u[IU]/mL — ABNORMAL HIGH (ref 0.350–4.500)

## 2019-11-03 LAB — HEMOGLOBIN A1C
Hgb A1c MFr Bld: 8.9 % — ABNORMAL HIGH (ref 4.8–5.6)
Mean Plasma Glucose: 208.73 mg/dL

## 2019-11-03 LAB — T4, FREE: Free T4: 0.88 ng/dL (ref 0.61–1.12)

## 2019-11-03 LAB — LIPID PANEL
Cholesterol: 107 mg/dL (ref 0–200)
HDL: 26 mg/dL — ABNORMAL LOW (ref >40)
LDL Cholesterol: 58 mg/dL (ref 0–99)
Total CHOL/HDL Ratio: 4.1 RATIO
Triglycerides: 117 mg/dL (ref <150)
VLDL: 23 mg/dL (ref 0–40)

## 2019-11-03 LAB — VITAMIN B12: Vitamin B-12: 192 pg/mL (ref 180–914)

## 2019-11-03 LAB — VITAMIN D 25 HYDROXY (VIT D DEFICIENCY, FRACTURES): Vit D, 25-Hydroxy: 30.6 ng/mL (ref 30–100)

## 2019-11-04 ENCOUNTER — Other Ambulatory Visit: Payer: Self-pay | Admitting: Internal Medicine

## 2019-11-04 DIAGNOSIS — E7800 Pure hypercholesterolemia, unspecified: Secondary | ICD-10-CM

## 2019-11-04 LAB — THYROID PEROXIDASE ANTIBODY: Thyroperoxidase Ab SerPl-aCnc: 71 IU/mL — ABNORMAL HIGH (ref 0–34)

## 2019-11-04 LAB — T3, FREE: T3, Free: 3.3 pg/mL (ref 2.0–4.4)

## 2019-11-04 MED ORDER — ATORVASTATIN CALCIUM 80 MG PO TABS: 80.0000 mg | ORAL_TABLET | Freq: Every day | ORAL | 3 refills | Status: DC

## 2019-11-05 DIAGNOSIS — H6123 Impacted cerumen, bilateral: Secondary | ICD-10-CM | POA: Diagnosis not present

## 2019-11-06 ENCOUNTER — Other Ambulatory Visit: Payer: Self-pay

## 2019-11-06 ENCOUNTER — Ambulatory Visit: Payer: 59 | Admitting: Internal Medicine

## 2019-11-06 ENCOUNTER — Encounter: Payer: Self-pay | Admitting: Internal Medicine

## 2019-11-06 VITALS — BP 132/82 | HR 69 | Temp 98.5°F | Ht 72.01 in | Wt 241.2 lb

## 2019-11-06 DIAGNOSIS — R739 Hyperglycemia, unspecified: Secondary | ICD-10-CM

## 2019-11-06 DIAGNOSIS — E669 Obesity, unspecified: Secondary | ICD-10-CM | POA: Diagnosis not present

## 2019-11-06 DIAGNOSIS — E1159 Type 2 diabetes mellitus with other circulatory complications: Secondary | ICD-10-CM | POA: Diagnosis not present

## 2019-11-06 DIAGNOSIS — Z1283 Encounter for screening for malignant neoplasm of skin: Secondary | ICD-10-CM | POA: Diagnosis not present

## 2019-11-06 DIAGNOSIS — R946 Abnormal results of thyroid function studies: Secondary | ICD-10-CM

## 2019-11-06 DIAGNOSIS — Z136 Encounter for screening for cardiovascular disorders: Secondary | ICD-10-CM | POA: Diagnosis not present

## 2019-11-06 DIAGNOSIS — R1084 Generalized abdominal pain: Secondary | ICD-10-CM | POA: Diagnosis not present

## 2019-11-06 DIAGNOSIS — L570 Actinic keratosis: Secondary | ICD-10-CM | POA: Diagnosis not present

## 2019-11-06 DIAGNOSIS — Z0000 Encounter for general adult medical examination without abnormal findings: Secondary | ICD-10-CM

## 2019-11-06 DIAGNOSIS — I152 Hypertension secondary to endocrine disorders: Secondary | ICD-10-CM

## 2019-11-06 DIAGNOSIS — I10 Essential (primary) hypertension: Secondary | ICD-10-CM

## 2019-11-06 NOTE — Patient Instructions (Addendum)
Endocrine Dr. Honor Junes   B12 1000 mcg daily  Vitamin D3 4000 IU daily    Colonoscopy due 02/04/20  Call dermatology Dr. Nehemiah Massed for an appt   Eye 12/29/19 call schedule    Actinic Keratosis An actinic keratosis is a precancerous growth on the skin. If there is more than one growth, the condition is called actinic keratoses. Actinic keratoses appear most often on areas of skin that get a lot of sun exposure, including the scalp, face, ears, lips, upper back, forearms, and the backs of the hands. If left untreated, these growths may develop into a skin cancer called squamous cell carcinoma. It is important to have all these growths checked by a health care provider to determine the best treatment approach. What are the causes? Actinic keratoses are caused by getting too much ultraviolet (UV) radiation from the sun or other UV light sources. What increases the risk? You are more likely to develop this condition if you:  Have light-colored skin and blue eyes.  Have blond or red hair.  Spend a lot of time in the sun.  Do not protect your skin from the sun when outdoors.  Are an older person. The risk of developing an actinic keratosis increases with age. What are the signs or symptoms? Actinic keratoses feel like scaly, rough spots of skin. Symptoms of this condition include growths that may:  Be as small as a pinhead or as big as a quarter.  Itch, hurt, or feel sensitive.  Be skin-colored, light tan, dark tan, pink, or a combination of any of these colors. In most cases, the growths become red.  Have a small piece of pink or gray skin (skin tag) growing from them. It may be easier to notice actinic keratoses by feeling them, rather than seeing them. Sometimes, actinic keratoses disappear, but many reappear a few days to a few weeks later. How is this diagnosed? This condition is usually diagnosed with a physical exam.  A tissue sample may be removed from the actinic keratosis  and examined under a microscope (biopsy). How is this treated? If needed, this condition may be treated by:  Scraping off the actinic keratosis (curettage).  Freezing the actinic keratosis with liquid nitrogen (cryosurgery). This causes the growth to eventually fall off the skin.  Applying medicated creams or gels to destroy the cells in the growth.  Applying chemicals to the actinic keratosis to make the outer layers of skin peel off (chemical peel).  Using photodynamic therapy. In this procedure, medicated cream is applied to the actinic keratosis. This cream increases your skin's sensitivity to light. Then, a strong light is aimed at the actinic keratosis to destroy cells in the growth. Follow these instructions at home: Skin care  Apply cool, wet cloths (cool compresses) to the affected areas.  Do not scratch your skin.  Check your skin regularly for any growths, especially growths that: ? Start to itch or bleed. ? Change in size, shape, or color. Caring for the treated area  Keep the treated area clean and dry as told by your health care provider.  Do not apply any medicine, cream, or lotion to the treated area unless your health care provider tells you to do that.  Do not pick at blisters or try to break them open. This can cause infection and scarring.  If you have red or irritated skin after treatment, follow instructions from your health care provider about how to take care of the treated area. Make sure  you: ? Wash your hands with soap and water before you change your bandage (dressing). If soap and water are not available, use hand sanitizer. ? Change your dressing as told by your health care provider.  If you have red or irritated skin after treatment, check your treated area every day for signs of infection. Check for: ? Redness, swelling, or pain. ? Fluid or blood. ? Warmth. ? Pus or a bad smell. General instructions  Take or apply over-the-counter and  prescription medicines only as told by your health care provider.  Return to your normal activities as told by your health care provider. Ask your health care provider what activities are safe for you.  Have a skin exam done every year by a health care provider who is a skin specialist (dermatologist).  Keep all follow-up visits as told by your health care provider. This is important. Lifestyle  Do not use any products that contain nicotine or tobacco, such as cigarettes and e-cigarettes. If you need help quitting, ask your health care provider.  Take steps to protect your skin from the sun. ? Try to avoid the sun between 10:00 a.m. and 4:00 p.m. This is when the UV light is the strongest. ? Use a sunscreen or sunblock with SPF 30 (sun protection factor 30) or greater. ? Apply sunscreen before you are exposed to sunlight and reapply as often as directed by the instructions on the sunscreen container. ? Always wear sunglasses that have UV protection, and always wear a hat and clothing to protect your skin from sunlight. ? When possible, avoid medicines that increase your sensitivity to sunlight. ? Do not use tanning beds or other indoor tanning devices. Contact a health care provider if:  You notice any changes or new growths on your skin.  You have swelling, pain, or more redness around your treated area.  You have fluid or blood coming from your treated area.  Your treated area feels warm to the touch.  You have pus or a bad smell coming from your treated area.  You have a fever.  You have a blister that becomes large and painful. Summary  An actinic keratosis is a precancerous growth on the skin. If there is more than one growth, the condition is called actinic keratoses. In some cases, if left untreated, these growths can develop into skin cancer.  Check your skin regularly for any growths, especially growths that start to itch or bleed, or change in size, shape, or  color.  Take steps to protect your skin from the sun.  Contact a health care provider if you notice any changes or new growths on your skin.  Keep all follow-up visits as told by your health care provider. This is important. This information is not intended to replace advice given to you by your health care provider. Make sure you discuss any questions you have with your health care provider. Document Revised: 07/30/2017 Document Reviewed: 07/30/2017 Elsevier Patient Education  [place].

## 2019-11-06 NOTE — Progress Notes (Signed)
Chief Complaint  Patient presents with  . Follow-up    Pt would like to discuss lab results    Annual doing well except  1.DM 2 uncontrolled on amaryl 2, metformin 850 and januvia 100 agreeable to see endocrine as he is c/w cost of medications disc ozempic, jardiance  2. TSH elevated >7 and TPO abs elevated and this is recurrent trend as tsh > 6 in 2011-06-01 as well not currently on meds refer endocrine  3. Scalp lesion sun damage agreeable to see Dr. Raliegh Ip  Review of Systems  Constitutional: Negative for weight loss.  HENT: Negative for hearing loss.   Eyes: Negative for blurred vision.  Gastrointestinal: Negative for abdominal pain.  Musculoskeletal: Negative for falls.  Skin: Negative for rash.  Neurological: Negative for headaches.  Psychiatric/Behavioral: Negative for depression.   Past Medical History:  Diagnosis Date  . Alopecia 04/04/2011  . Diabetes mellitus   . Erectile dysfunction 04/04/2011  . Heart murmur   . Hyperlipidemia 04/04/2011  . Hypogonadism male 04/04/2011  . Myocardial infarction ([place])   . Stroke ([place])   . Substance abuse ([place])   . Type II or unspecified type diabetes mellitus without mention of complication, uncontrolled 04/04/2011  . Unspecified essential hypertension 04/04/2011   Past Surgical History:  Procedure Laterality Date  . BUBBLE STUDY  08/03/2019   Procedure: BUBBLE STUDY;  Surgeon: Fay Records, MD;  Location: [facility];  Service: Cardiovascular;;  . CARDIAC CATHETERIZATION    . LOOP RECORDER INSERTION N/A 08/03/2019   Procedure: LOOP RECORDER INSERTION;  Surgeon: Evans Lance, MD;  Location: [facility];  Service: Cardiovascular;  Laterality: N/A;  . TEE WITHOUT CARDIOVERSION N/A 08/03/2019   Procedure: TRANSESOPHAGEAL ECHOCARDIOGRAM (TEE);  Surgeon: Fay Records, MD;  Location: [facility] ENDOSCOPY;  Service: Cardiovascular;  Laterality: N/A;   Family History  Problem Relation Age of Onset  . Hypertension Father   . Dementia Father        died age  60 in 05/31/2017  . Memory loss Father   . Emphysema Mother   . COPD Mother   . Thyroid disease Mother   . Cancer Neg Hx   . Diabetes Neg Hx   . Heart disease Neg Hx   . Stroke Neg Hx    Social History   Socioeconomic History  . Marital status: Married    Spouse name: Not on file  . Number of children: Not on file  . Years of education: Not on file  . Highest education level: Not on file  Occupational History  . Occupation: OPERATIONS MANAGER of maintenance    Employer: [employer]  Tobacco Use  . Smoking status: Never Smoker  . Smokeless tobacco: Never Used  Vaping Use  . Vaping Use: Never used  Substance and Sexual Activity  . Alcohol use: No    Comment: former etoh abuse in 37s   . Drug use: No  . Sexual activity: Yes  Other Topics Concern  . Not on file  Social History Narrative   Caffienated drinks-no   Seat belt use often-yes   Regular Exercise-no   Smoke alarm in the home-yes   Firearms/guns in the home-yes   History of physical abuse-no      Married 2 kids daughter and son    Oldest of siblings has 2 brothers and 1 sister    Never smoker    Works Physicist, medical and [employer] wants to retire at 59 y.o. works  in Education administrator       DPR wife Vickie             Social Determinants of Health   Financial Resource Strain:   . Difficulty of Paying Living Expenses:   Food Insecurity:   . Worried About Charity fundraiser in the Last Year:   . Arboriculturist in the Last Year:   Transportation Needs:   . Film/video editor (Medical):   Marland Kitchen Lack of Transportation (Non-Medical):   Physical Activity:   . Days of Exercise per Week:   . Minutes of Exercise per Session:   Stress:   . Feeling of Stress :   Social Connections:   . Frequency of Communication with Friends and Family:   . Frequency of Social Gatherings with Friends and Family:   . Attends Religious Services:   . Active Member of Clubs or Organizations:   . Attends English as a second language teacher Meetings:   Marland Kitchen Marital Status:   Intimate Partner Violence:   . Fear of Current or Ex-Partner:   . Emotionally Abused:   Marland Kitchen Physically Abused:   . Sexually Abused:    Current Meds  Medication Sig  . alprostadil (EDEX) 20 MCG injection 20 mcg by Intracavitary route as needed for erectile dysfunction. use no more than 3 times per week  . aspirin EC 81 MG tablet Take 1 tablet (81 mg total) by mouth daily. STOP TAKING after 21 days and continue PLAVIX ALONE  . atorvastatin (LIPITOR) 80 MG tablet Take 1 tablet (80 mg total) by mouth daily at 6 PM.  . blood glucose meter kit and supplies Dispense based on patient and insurance preference. Use up to bid daily as directed. (FOR ICD-10 E10.9, E11.9). 1 year supply of lancets and strips  . clomiPHENE (CLOMID) 50 MG tablet Take 50 mg by mouth 3 (three) times a week.   . clopidogrel (PLAVIX) 75 MG tablet Take 1 tablet (75 mg total) by mouth daily.  . Continuous Blood Gluc Receiver (FREESTYLE LIBRE 14 DAY READER) DEVI 1 Device by Does not apply route in the morning and at bedtime.  . Continuous Blood Gluc Sensor (FREESTYLE LIBRE 14 DAY SENSOR) MISC 1 Device by Does not apply route in the morning and at bedtime.  Marland Kitchen ezetimibe (ZETIA) 10 MG tablet Take 1 tablet (10 mg total) by mouth daily.  Marland Kitchen FREESTYLE LITE test strip 1 each 3 (three) times daily.  Marland Kitchen glimepiride (AMARYL) 2 MG tablet Take 1 tablet (2 mg total) by mouth 2 (two) times daily with a meal.  . Lancets (FREESTYLE) lancets 1 each 3 (three) times daily.  Marland Kitchen lisinopril (ZESTRIL) 40 MG tablet Take 1 tablet (40 mg total) by mouth daily.  . metFORMIN (GLUCOPHAGE) 850 MG tablet Take 1 tablet (850 mg total) by mouth daily with breakfast.  . metoprolol succinate (TOPROL-XL) 50 MG 24 hr tablet Take 1 tablet (50 mg total) by mouth daily. Take with or immediately following a meal.  . omeprazole (PRILOSEC OTC) 20 MG tablet Take 20 mg by mouth daily as needed (acid reflux/heartburn).  . sildenafil  (VIAGRA) 100 MG tablet Take 0.5-1 tablets (50-100 mg total) by mouth daily as needed for erectile dysfunction. (Patient taking differently: Take 100 mg by mouth daily as needed for erectile dysfunction. )  . sitaGLIPtin (JANUVIA) 100 MG tablet Take 1 tablet (100 mg total) by mouth daily.  . valACYclovir (VALTREX) 1000 MG tablet Take 1-2 tablets (1,000-2,000 mg total) by mouth  See admin instructions. Take 1-2 tablets (1000 -2000 mg) by mouth every 12 hours for 2-3 days as needed for fever blisters   Allergies  Allergen Reactions  . Pravachol [Pravastatin Sodium] Other (See Comments)    Muscle aches   Recent Results (from the past 2160 hour(s))  CUP PACEART INCLINIC DEVICE CHECK     Status: None   Collection Time: 08/13/19 11:58 AM  Result Value Ref Range   Pulse Generator Manufacturer MERM    Date Time Interrogation Session [phone]    Pulse Gen Model LNQ22 LINQ II    Pulse Gen Serial Number [id number] [facility] Name [facility] Pulse Generator Type ICM/ILR    Implantable Pulse Generator Implant Date [id number]    Eval Rhythm VS 63   CUP PACEART REMOTE DEVICE CHECK     Status: None   Collection Time: 09/21/19  6:54 AM  Result Value Ref Range   Date Time Interrogation Session [id number]    Pulse Generator Manufacturer MERM    Pulse Gen Model LNQ22 LINQ II    Pulse Gen Serial Number [id number] [facility] Name [facility]    Implantable Pulse Generator Type ICM/ILR    Implantable Pulse Generator Implant Date [id number]    Eval Rhythm SR 67   CUP PACEART REMOTE DEVICE CHECK     Status: None   Collection Time: 10/25/19 11:06 PM  Result Value Ref Range   Date Time Interrogation Session [id number]    Pulse Generator Manufacturer MERM    Pulse Gen Model LNQ22 LINQ II    Pulse Gen Serial Number [id number] [facility] Name [facility]    Implantable Pulse Generator Type ICM/ILR    Implantable Pulse Generator Implant Date [id number]    Eval Rhythm SR at 60  bpm   Vitamin D (25 hydroxy)     Status: None   Collection Time: 11/03/19  9:01 AM  Result Value Ref Range   Vit D, 25-Hydroxy 30.60 30 - 100 ng/mL    Comment: (NOTE) Vitamin D deficiency has been defined by the [place] practice guideline as a level of serum 25-OH  vitamin D less than 20 ng/mL (1,2). The Endocrine Society went on to  further define vitamin D insufficiency as a level between 21 and 29  ng/mL (2).  1. IOM (Institute of Medicine). 2010. Dietary reference intakes for  calcium and D. [place]: The [employer]. 2. Holick MF, [place], Bischoff-Ferrari HA, et al. Evaluation,  treatment, and prevention of vitamin D deficiency: an Endocrine  Society clinical practice guideline, JCEM. 2011 Jul; 96(7): 1911-30.  Performed at [facility], [place] [address], [place], [place] [address]   T3, free     Status: None   Collection Time: 11/03/19  9:01 AM  Result Value Ref Range   T3, Free 3.3 2.0 - 4.4 pg/mL    Comment: (NOTE) Performed At: [facility] [place], [place] [id number] Rush Farmer MD [id number]   B12     Status: None   Collection Time: 11/03/19  9:01 AM  Result Value Ref Range   Vitamin B-12 192 180 - 914 pg/mL    Comment: (NOTE) This assay is not validated for testing neonatal or myeloproliferative syndrome specimens for Vitamin B12 levels. Performed at [facility], [place] [address] [place], [place] [address]   Thyroid peroxidase antibody     Status:  Abnormal   Collection Time: 11/03/19  9:01 AM  Result Value Ref Range   Thyroperoxidase Ab SerPl-aCnc 71 (H) 0 - 34 IU/mL    Comment: (NOTE) Performed At: [facility] [place], [place] [id number] Rush Farmer MD [id number]   T4, free     Status: None   Collection Time: 11/03/19  9:01 AM  Result Value Ref Range   Free T4 0.88 0.61 - 1.12 ng/dL    Comment: (NOTE) Biotin ingestion may  interfere with free T4 tests. If the results are inconsistent with the TSH level, previous test results, or the clinical presentation, then consider biotin interference. If needed, order repeat testing after stopping biotin. Performed at [facility], [address] [place], [place] [address]   TSH     Status: Abnormal   Collection Time: 11/03/19  9:01 AM  Result Value Ref Range   TSH 7.388 (H) 0.350 - 4.500 uIU/mL    Comment: Performed by a 3rd Generation assay with a functional sensitivity of <=0.01 uIU/mL. Performed at [facility], [address] [place], [place] [address]   Hemoglobin A1c     Status: Abnormal   Collection Time: 11/03/19  9:01 AM  Result Value Ref Range   Hgb A1c MFr Bld 8.9 (H) 4.8 - 5.6 %    Comment: (NOTE) Pre diabetes:          5.7%-6.4%  Diabetes:              >6.4%  Glycemic control for   <7.0% adults with diabetes    Mean Plasma Glucose 208.73 mg/dL    Comment: Performed at [facility] [address], [place], [place] [address]  CBC with Differential/Platelet     Status: Abnormal   Collection Time: 11/03/19  9:01 AM  Result Value Ref Range   WBC 7.4 4.0 - 10.5 K/uL   RBC 3.99 (L) 4.22 - 5.81 MIL/uL   Hemoglobin 13.4 13.0 - 17.0 g/dL   HCT 38.0 (L) 39 - 52 %   MCV 95.2 80.0 - 100.0 fL   MCH 33.6 26.0 - 34.0 pg   MCHC 35.3 30.0 - 36.0 g/dL   RDW 12.1 11.5 - 15.5 %   Platelets 166 150 - 400 K/uL   nRBC 0.0 0.0 - 0.2 %   Neutrophils Relative % 63 %   Neutro Abs 4.7 1.7 - 7.7 K/uL   Lymphocytes Relative 24 %   Lymphs Abs 1.7 0.7 - 4.0 K/uL   Monocytes Relative 11 %   Monocytes Absolute 0.8 0 - 1 K/uL   Eosinophils Relative 2 %   Eosinophils Absolute 0.2 0 - 0 K/uL   Basophils Relative 0 %   Basophils Absolute 0.0 0 - 0 K/uL   Immature Granulocytes 0 %   Abs Immature Granulocytes 0.03 0.00 - 0.07 K/uL    Comment: Performed at [facility], [address] [place], [place] [address]  Lipid panel      Status: Abnormal   Collection Time: 11/03/19  9:01 AM  Result Value Ref Range   Cholesterol 107 0 - 200 mg/dL   Triglycerides 117 <150 mg/dL   HDL 26 (L) >40 mg/dL   Total CHOL/HDL Ratio 4.1 RATIO   VLDL 23 0 - 40 mg/dL   LDL Cholesterol 58 0 - 99 mg/dL    Comment:        Total Cholesterol/HDL:CHD Risk Coronary Heart Disease Risk Table  Men   Women  1/2 Average Risk   3.4   3.3  Average Risk       5.0   4.4  2 X Average Risk   9.6   7.1  3 X Average Risk  23.4   11.0        Use the calculated Patient Ratio above and the CHD Risk Table to determine the patient's CHD Risk.        ATP III CLASSIFICATION (LDL):  <100     mg/dL   Optimal  100-129  mg/dL   Near or Above                    Optimal  130-159  mg/dL   Borderline  160-189  mg/dL   High  >190     mg/dL   Very High Performed at [facility], [address] [place], [place] [address]   Comprehensive metabolic panel     Status: Abnormal   Collection Time: 11/03/19  9:01 AM  Result Value Ref Range   Sodium 134 (L) 135 - 145 mmol/L   Potassium 4.6 3.5 - 5.1 mmol/L   Chloride 104 98 - 111 mmol/L   CO2 22 22 - 32 mmol/L   Glucose, Bld 208 (H) 70 - 99 mg/dL    Comment: Glucose reference range applies only to samples taken after fasting for at least 8 hours.   BUN 21 (H) 6 - 20 mg/dL   Creatinine, Ser 1.28 (H) 0.61 - 1.24 mg/dL   Calcium 9.1 8.9 - 10.3 mg/dL   Total Protein 6.8 6.5 - 8.1 g/dL   Albumin 4.2 3.5 - 5.0 g/dL   AST 22 15 - 41 U/L   ALT 24 0 - 44 U/L   Alkaline Phosphatase 59 38 - 126 U/L   Total Bilirubin 1.2 0.3 - 1.2 mg/dL   GFR calc non Af Amer >60 >60 mL/min   GFR calc Af Amer >60 >60 mL/min   Anion gap 8 5 - 15    Comment: Performed at [facility], [address] [place], [place] [address]   Objective  Body mass index is 32.71 kg/m. Wt Readings from Last 3 Encounters:  11/06/19 241 lb 3.2 oz (109.4 kg)  09/03/19 234 lb (106.1 kg)  08/06/19 234 lb 9.6 oz  (106.4 kg)   Temp Readings from Last 3 Encounters:  11/06/19 98.5 F (36.9 C)  08/06/19 (!) 97.3 F (36.3 C) (Temporal)  08/03/19 (!) 97.5 F (36.4 C) (Axillary)   BP Readings from Last 3 Encounters:  11/06/19 132/82  09/03/19 129/79  08/06/19 130/86   Pulse Readings from Last 3 Encounters:  11/06/19 69  09/03/19 66  08/06/19 61    Physical Exam Vitals and nursing note reviewed.  Constitutional:      Appearance: Normal appearance. He is well-developed and well-groomed. He is obese.  HENT:     Head: Normocephalic and atraumatic.  Eyes:     Conjunctiva/sclera: Conjunctivae normal.     Pupils: Pupils are equal, round, and reactive to light.  Cardiovascular:     Rate and Rhythm: Normal rate and regular rhythm.     Heart sounds: Normal heart sounds. No murmur heard.   Pulmonary:     Effort: Pulmonary effort is normal.     Breath sounds: Normal breath sounds.  Abdominal:     General: Abdomen is flat. Bowel sounds are normal.  Skin:    General: Skin is warm and  dry.       Neurological:     General: No focal deficit present.     Mental Status: He is alert and oriented to person, place, and time. Mental status is at baseline.     Gait: Gait normal.  Psychiatric:        Attention and Perception: Attention and perception normal.        Mood and Affect: Mood and affect normal.        Speech: Speech normal.        Behavior: Behavior normal. Behavior is cooperative.        Thought Content: Thought content normal.        Cognition and Memory: Cognition and memory normal.        Judgment: Judgment normal.     Assessment  Plan  Annual physical exam Flu shot at [facility] had 06/25/15 Disc shingrix today to consider in the futurewants to wait until covid 19 vaccine comes out and he gest this  pna 23 had 04/05/14 due 04/2019 Pfizer 2/2 had  PSA, ED, low T with Dr. Tonette Bihari [facility] saw w/in the last few months 2020 get records 09/15/18 [facility]  appt low T PSA 09/15/18 1.28, T was 1003.6 -on testosterone Q1-2 weeks per urology -appt 09/2019  Colonoscopy 02/04/15 Dr. Melina Copa diverticulosis f/u in 5 years [facility] 11/2021pt to call and schedule  Eye exam due 12/29/19 scheduled per pt  Hep C negative   Dermatology Dr. Nehemiah Massed due to f/u in 10 or 11/2020pt will call for appt as of 02/24/2019  -referred today Never smoker  Former etoh abuse in 25s but quit in 54s   rec healthy diet and exercise  Abnormal thyroid function test - Plan: US THYROID, Ambulatory referral to Endocrinology Hypertension associated with diabetes ([place]) uncontrolled A1C 8.9 Results for [place], Laquinton F (MRN [id number]) as of 11/06/2019 08:44  Ref. Range 11/11/2017 12:08 12/05/2017 16:24 07/18/2018 10:07 10/28/2018 09:32 03/31/2019 10:00 07/31/2019 19:38 07/31/2019 19:38 08/01/2019 02:35 08/02/2019 03:26 08/03/2019 07:21 11/03/2019 09:01  Glucose Latest Ref Range: 70 - 99 mg/dL 148 (H) 172 (H) 236 (H) 140 (H) 283 (H) 175 (H) 190 (H) 235 (H) 203 (H) 200 (H) 208 (H)  Hemoglobin A1C Latest Ref Range: 4.8 - 5.6 %  6.6 (H) 7.5 (H) 7.0 (H) 8.7 (H)   8.1 (H)   8.9 (H)     - Plan: Ambulatory referral to Endocrinology Results for [place], Yonathan F (MRN [id number]) as of 11/06/2019 08:44  Ref. Range 04/05/2011 15:39 08/03/2011 14:19 12/05/2011 16:15 03/18/2012 09:15 02/29/2016 09:38 08/22/2017 10:05 03/31/2019 10:00 11/03/2019 09:01  TSH Latest Ref Range: 0.350 - 4.500 uIU/mL 6.44 (H) 6.47 (H) 5.43 3.72 3.42 3.78 5.328 (H) 7.388 (H)   Results for [place], Oral F (MRN [id number]) as of 11/06/2019 08:44  Ref. Range 03/31/2019 10:00 11/03/2019 09:01  TSH Latest Ref Range: 0.350 - 4.500 uIU/mL 5.328 (H) 7.388 (H)  Triiodothyronine,Free,Serum Latest Ref Range: 2.0 - 4.4 pg/mL  3.3  T4,Free(Direct) Latest Ref Range: 0.61 - 1.12 ng/dL  0.88  Thyroperoxidase Ab SerPl-aCnc Latest Ref Range: 0 - 34 IU/mL  71 (H)    Actinic keratosis - Plan: Ambulatory referral to Dermatology Skin cancer screening -  Plan: Ambulatory referral to Dermatology  rec healthy diet and exercise     Provider: Dr. Olivia Mackie McLean-Scocuzza-[facility]

## 2019-11-09 ENCOUNTER — Telehealth: Payer: Self-pay | Admitting: Internal Medicine

## 2019-11-09 NOTE — Telephone Encounter (Signed)
Patient has been informed.

## 2019-11-09 NOTE — Telephone Encounter (Signed)
Patient called office back. He really would like his lab results. Would someone please call him today.

## 2019-11-09 NOTE — Telephone Encounter (Signed)
Pt called in about his results want a called back because he did not get all the info.

## 2019-11-10 ENCOUNTER — Other Ambulatory Visit: Payer: Self-pay

## 2019-11-10 NOTE — Patient Outreach (Signed)
[facility]) Care Management  11/10/2019  Karl Morgan 1960-07-08 [id number]  Second telephone outreach attempt to obtain mRs. Patient answered call and requested a call back later this afternoon to complete mRs.  CMA will make follow up call this afternoon.  Ina Homes [facility] Management Assistant [phone]

## 2019-11-10 NOTE — Patient Outreach (Signed)
[facility]) Care Management  11/10/2019  Karl Morgan Jun 10, 1960 [id number]  3 outreach attempts were completed to obtain mRs. mRs could not be obtained because patient never returned my calls. mRs=7    [facility] Management Assistant [phone]

## 2019-11-11 ENCOUNTER — Telehealth: Payer: Self-pay | Admitting: Internal Medicine

## 2019-11-11 ENCOUNTER — Other Ambulatory Visit: Payer: Self-pay | Admitting: Internal Medicine

## 2019-11-11 DIAGNOSIS — B009 Herpesviral infection, unspecified: Secondary | ICD-10-CM

## 2019-11-11 MED ORDER — VALACYCLOVIR HCL 1 G PO TABS: 1000.0000 mg | ORAL_TABLET | ORAL | 3 refills | Status: DC

## 2019-11-11 NOTE — Telephone Encounter (Signed)
Pharmacy requesting a refill of valtrex. Not last sent in by you. Okay to send in?

## 2019-11-12 ENCOUNTER — Other Ambulatory Visit: Payer: 59

## 2019-11-12 ENCOUNTER — Other Ambulatory Visit: Payer: Self-pay

## 2019-11-12 ENCOUNTER — Ambulatory Visit: Payer: 59

## 2019-11-12 ENCOUNTER — Ambulatory Visit
Admission: RE | Admit: 2019-11-12 | Discharge: 2019-11-12 | Disposition: A | Discharge status: Home / Self Care | Payer: 59 | Source: Ambulatory Visit | Attending: Internal Medicine | Admitting: Internal Medicine

## 2019-11-12 ENCOUNTER — Encounter: Payer: Self-pay | Admitting: Internal Medicine

## 2019-11-12 DIAGNOSIS — R1084 Generalized abdominal pain: Secondary | ICD-10-CM | POA: Diagnosis not present

## 2019-11-12 DIAGNOSIS — K760 Fatty (change of) liver, not elsewhere classified: Secondary | ICD-10-CM | POA: Insufficient documentation

## 2019-11-12 DIAGNOSIS — Z136 Encounter for screening for cardiovascular disorders: Secondary | ICD-10-CM | POA: Diagnosis not present

## 2019-11-12 DIAGNOSIS — I10 Essential (primary) hypertension: Secondary | ICD-10-CM | POA: Diagnosis not present

## 2019-11-12 DIAGNOSIS — R946 Abnormal results of thyroid function studies: Secondary | ICD-10-CM | POA: Insufficient documentation

## 2019-11-12 DIAGNOSIS — E079 Disorder of thyroid, unspecified: Secondary | ICD-10-CM | POA: Diagnosis not present

## 2019-11-12 DIAGNOSIS — N1330 Unspecified hydronephrosis: Secondary | ICD-10-CM | POA: Diagnosis not present

## 2019-11-12 DIAGNOSIS — K824 Cholesterolosis of gallbladder: Secondary | ICD-10-CM | POA: Insufficient documentation

## 2019-11-26 DIAGNOSIS — D2372 Other benign neoplasm of skin of left lower limb, including hip: Secondary | ICD-10-CM | POA: Diagnosis not present

## 2019-11-26 DIAGNOSIS — L570 Actinic keratosis: Secondary | ICD-10-CM | POA: Diagnosis not present

## 2019-11-26 DIAGNOSIS — L638 Other alopecia areata: Secondary | ICD-10-CM | POA: Diagnosis not present

## 2019-11-29 LAB — CUP PACEART REMOTE DEVICE CHECK
Date Time Interrogation Session: 20210827230536
Implantable Pulse Generator Implant Date: 20210503

## 2019-11-30 ENCOUNTER — Ambulatory Visit (INDEPENDENT_AMBULATORY_CARE_PROVIDER_SITE_OTHER): Payer: 59 | Admitting: *Deleted

## 2019-11-30 DIAGNOSIS — I6340 Cerebral infarction due to embolism of unspecified cerebral artery: Secondary | ICD-10-CM

## 2019-12-01 ENCOUNTER — Other Ambulatory Visit: Payer: Self-pay | Admitting: Urology

## 2019-12-01 NOTE — Progress Notes (Signed)
Carelink Summary Report / Loop Recorder 

## 2019-12-24 ENCOUNTER — Other Ambulatory Visit: Payer: Self-pay | Admitting: "Endocrinology

## 2019-12-24 DIAGNOSIS — E230 Hypopituitarism: Secondary | ICD-10-CM | POA: Diagnosis not present

## 2019-12-24 DIAGNOSIS — E1159 Type 2 diabetes mellitus with other circulatory complications: Secondary | ICD-10-CM | POA: Diagnosis not present

## 2019-12-24 DIAGNOSIS — E1169 Type 2 diabetes mellitus with other specified complication: Secondary | ICD-10-CM | POA: Diagnosis not present

## 2019-12-24 DIAGNOSIS — E039 Hypothyroidism, unspecified: Secondary | ICD-10-CM | POA: Diagnosis not present

## 2019-12-30 DIAGNOSIS — N5201 Erectile dysfunction due to arterial insufficiency: Secondary | ICD-10-CM | POA: Diagnosis not present

## 2019-12-30 DIAGNOSIS — E291 Testicular hypofunction: Secondary | ICD-10-CM | POA: Diagnosis not present

## 2019-12-31 LAB — CUP PACEART REMOTE DEVICE CHECK
Date Time Interrogation Session: 20210929230516
Implantable Pulse Generator Implant Date: 20210503

## 2020-01-04 ENCOUNTER — Ambulatory Visit (INDEPENDENT_AMBULATORY_CARE_PROVIDER_SITE_OTHER): Payer: 59

## 2020-01-04 ENCOUNTER — Ambulatory Visit (INDEPENDENT_AMBULATORY_CARE_PROVIDER_SITE_OTHER): Payer: 59 | Admitting: Adult Health

## 2020-01-04 ENCOUNTER — Encounter: Payer: Self-pay | Admitting: Adult Health

## 2020-01-04 ENCOUNTER — Other Ambulatory Visit: Payer: Self-pay

## 2020-01-04 VITALS — BP 111/67 | HR 58 | Ht 72.0 in | Wt 242.0 lb

## 2020-01-04 DIAGNOSIS — I6340 Cerebral infarction due to embolism of unspecified cerebral artery: Secondary | ICD-10-CM

## 2020-01-04 DIAGNOSIS — E785 Hyperlipidemia, unspecified: Secondary | ICD-10-CM | POA: Diagnosis not present

## 2020-01-04 DIAGNOSIS — I10 Essential (primary) hypertension: Secondary | ICD-10-CM

## 2020-01-04 DIAGNOSIS — I639 Cerebral infarction, unspecified: Secondary | ICD-10-CM | POA: Diagnosis not present

## 2020-01-04 DIAGNOSIS — E1165 Type 2 diabetes mellitus with hyperglycemia: Secondary | ICD-10-CM

## 2020-01-04 NOTE — Progress Notes (Signed)
I agree with the above plan 

## 2020-01-04 NOTE — Progress Notes (Signed)
[facility] [address] [place]. [place] [address] [phone]       STROKE FOLLOW UP NOTE  [name] Karl Morgan Date of Birth:  [birth year] Medical Record Number:  [id number]   Reason for Referral: stroke follow up    SUBJECTIVE:   CHIEF COMPLAINT:  Chief Complaint  Patient presents with  . Follow-up    rm 9  . Cerebrovascular Accident    Pt said he has no new concern.    HPI:   Today, 01/04/2020, [name] returns for 36-monthstroke follow-up unaccompanied.  He has been doing well since prior visit with occasional imbalance/dizziness greater when standing from a bent over position but he believes this could possibly be due to blood pressure.  Denies new or worsening stroke/TIA symptoms. Remains on aspirin and Plavix without bleeding or bruising.  Remains on atorvastatin and Zetia without myalgias.  Blood pressure today satisfactory at 111/67.  Recently establish care with endocrinology for uncontrolled DM with recent A1c 8.6.  Recently diagnosed with hypothyroidism and started on levothyroxine.  Loop recorder has not shown atrial fibrillation thus far.  No concerns at this time.     History provided for reference purposes only Initial visit 09/03/2019 JM: [name] being seen today hospital follow up. He has been doing well since discharge with residual blurred vision and dizziness/imbalance. Symptoms will fluctuate but does endorse ongoing improvement.  He did not participate in any therapies and does not feel as though this is needed.  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  Blood pressure stable at home with today's level 129/79.  Glucose levels have been ranging from 150-229 currently working with PCP for ongoing management.  Loop recorder has not shown atrial fibrillation thus far.  He questions safety  of testosterone as he was previously receiving weekly injections but PCP discontinued due to stroke risk concerns.  No further concerns at this time.  Stroke admission 07/31/2019 KarlKarl F Canadais a 59y.o.malewith history of a previous stroke, DM, CAD with previous MI, hx of substance abuse and hyperlipidemia, who presented on 07/31/2019 with acute dizziness, "head buzzing",and gait instability which started abruptly shortly after 6 PM.  Stroke work-up revealed acute infarct right superior cerebellum infarct with superimposed hemorrhagic transformation within the infarct -recurrent cryptogenic infarcts on DAPT.  Loop recorder placed to monitor for atrial fibrillation.  Recommended DAPT for 3 weeks and Plavix alone.  History of HTN with long-term BP goal normotensive range.  LDL 75 and recommend continuation of atorvastatin 80 mg daily.  Uncontrolled DM with A1c 8.7.  Other stroke risk factors include history of stroke cryptogenic 02/2015, obesity and CAD.  He was discharged home in stable condition.  Stroke:Acute infarct right superior cerebellum infarct w/ superimposed hemorrhagic transformation within the infarct - recurrent cryptogenic infarcts on DAPT   Resultant symptoms resolved  Code Stroke CT Head - Right frontal hypodensity most likely a small chronic infarct. Negative for hemorrhage. ASPECTS is 10   CTA H&N - Mild atherosclerotic disease at the carotid bifurcation without significant carotid stenosis in the neck. 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 No intracranial large vessel occlusion or flow limiting stenosis.  MRI head -08/01/19 -Acute infarct right superior cerebellum. Superimposed  hemorrhage is present within the infarct. Mild local mass-effect without hydrocephalus.  CT Head - 08/02/19- Unchanged small area of central hemorrhage within the right cerebellar  infarct. No new site of hemorrhage or ischemia  2D Echo- EF 65- 70%. No cardiac source of emboli identified.  TEE (no PFO, mild thoracic aorta atherosclerosis, LV fxn mildly reduced)  loop recorderplacement 08/03/2019   Lacey Jensen Virus 2 - negative  LDL - 75  HgbA1c- 8.7  UDS -neg  aspirin 81 mg daily and clopidogrel 75 mg dailyprior to admission, now on no antithromboticgiven small hemorrhagic conversion. Resume DAPT on discharge for 3 weeks and then plavix alone.  Therapy recommendations:None  Disposition: return home   ROS:   14 system review of systems performed and negative with exception of dizziness  PMH:  Past Medical History:  Diagnosis Date  . Alopecia 04/04/2011  . Diabetes mellitus   . Erectile dysfunction 04/04/2011  . Heart murmur   . Hyperlipidemia 04/04/2011  . Hypogonadism male 04/04/2011  . Myocardial infarction ([place])   . Stroke ([place])   . Substance abuse ([place])   . Type II or unspecified type diabetes mellitus without mention of complication, uncontrolled 04/04/2011  . Unspecified essential hypertension 04/04/2011    PSH:  Past Surgical History:  Procedure Laterality Date  . BUBBLE STUDY  08/03/2019   Procedure: BUBBLE STUDY;  Surgeon: Fay Records, MD;  Location: [place];  Service: Cardiovascular;;  . CARDIAC CATHETERIZATION    . LOOP RECORDER INSERTION N/A 08/03/2019   Procedure: LOOP RECORDER INSERTION;  Surgeon: Evans Lance, MD;  Location: [place] CV LAB;  Service: Cardiovascular;  Laterality: N/A;  . TEE WITHOUT CARDIOVERSION N/A 08/03/2019   Procedure: TRANSESOPHAGEAL ECHOCARDIOGRAM (TEE);  Surgeon: Fay Records, MD;  Location: [facility] ENDOSCOPY;  Service: Cardiovascular;  Laterality: N/A;    Social History:  Social History   Socioeconomic History  . Marital status: Married    Spouse name: Not on file  . Number of children: Not on file  . Years of education: Not on file  . Highest education level: Not on file  Occupational  History  . Occupation: OPERATIONS MANAGER of maintenance    Employer: [employer]  Tobacco Use  . Smoking status: Never Smoker  . Smokeless tobacco: Never Used  Vaping Use  . Vaping Use: Never used  Substance and Sexual Activity  . Alcohol use: No    Comment: former etoh abuse in 21s   . Drug use: No  . Sexual activity: Yes  Other Topics Concern  . 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 Difficulty of Paying Living Expenses: Not on file  Food Insecurity:   . Worried About Charity fundraiser in the Last Year: Not on file  . Ran Out of Food in the Last Year: Not on  file  Transportation Needs:   . Film/video editor (Medical): Not on file  . Lack of Transportation (Non-Medical): Not on file  Physical Activity:   . Days of Exercise per Week: Not on file  . Minutes of Exercise per Session: Not on file  Stress:   . Feeling of Stress : Not on file  Social Connections:   . Frequency of Communication with Friends and Family: Not on file  . Frequency of Social Gatherings with Friends and Family: Not on file  . Attends Religious Services: Not on file  . Active Member of Clubs or Organizations: Not on file  . Attends Archivist Meetings: Not on file  . Marital Status: Not on file  Intimate Partner Violence:   . Fear of Current or Ex-Partner: Not on file  . Emotionally Abused: Not on file  . Physically Abused: Not on file  . Sexually Abused: Not on file    Family History:  Family  History  Problem Relation Age of Onset  . Hypertension Father   . Dementia Father        died age 80 in 06/28/17  . Memory loss Father   . Emphysema Mother   . COPD Mother   . Thyroid disease Mother   . Cancer Neg Hx   . Diabetes Neg Hx   . Heart disease Neg Hx   . Stroke Neg Hx     Medications:   Current Outpatient Medications on File Prior to Visit  Medication Sig Dispense Refill  . alprostadil (EDEX) 20 MCG injection 20 mcg by Intracavitary route as needed for erectile dysfunction. use no more than 3 times per week    . aspirin EC 81 MG tablet Take 1 tablet (81 mg total) by mouth daily. STOP TAKING after 21 days and continue PLAVIX ALONE 21 tablet 0  . atorvastatin (LIPITOR) 80 MG tablet Take 1 tablet (80 mg total) by mouth daily at 6 PM. 90 tablet 3  . blood glucose meter kit and supplies Dispense based on patient and insurance preference. Use up to bid daily as directed. (FOR ICD-10 E10.9, E11.9). 1 year supply of lancets and strips 1 each 0  . clomiPHENE (CLOMID) 50 MG tablet Take 50 mg by mouth 3 (three) times a week.     . clopidogrel (PLAVIX) 75 MG tablet Take 1 tablet (75 mg total) by mouth daily.    . Continuous Blood Gluc Receiver (FREESTYLE LIBRE 14 DAY READER) DEVI 1 Device by Does not apply route in the morning and at bedtime. 2 each 11  . Continuous Blood Gluc Sensor (FREESTYLE LIBRE 14 DAY SENSOR) MISC 1 Device by Does not apply route in the morning and at bedtime. 2 each 11  . ezetimibe (ZETIA) 10 MG tablet Take 1 tablet (10 mg total) by mouth daily. 90 tablet 3  . FREESTYLE LITE test strip 1 each 3 (three) times daily.    Marland Kitchen glimepiride (AMARYL) 2 MG tablet Take 1 tablet (2 mg total) by mouth 2 (two) times daily with a meal. 180 tablet 3  . Lancets (FREESTYLE) lancets 1 each 3 (three) times daily.    Marland Kitchen LEVOTHYROXINE SODIUM PO Take by mouth.    Marland Kitchen lisinopril (ZESTRIL) 40 MG tablet Take 1 tablet (40 mg total) by mouth daily. 90 tablet 3  . metFORMIN (GLUCOPHAGE) 850 MG  tablet Take 1 tablet (850 mg total) by mouth daily with breakfast. (Patient taking differently: Take 850 mg by mouth daily with breakfast. BID)  90 tablet 3  . metoprolol succinate (TOPROL-XL) 50 MG 24 hr tablet Take 1 tablet (50 mg total) by mouth daily. Take with or immediately following a meal. 90 tablet 3  . omeprazole (PRILOSEC OTC) 20 MG tablet Take 20 mg by mouth daily as needed (acid reflux/heartburn).    . sildenafil (VIAGRA) 100 MG tablet Take 0.5-1 tablets (50-100 mg total) by mouth daily as needed for erectile dysfunction. (Patient taking differently: Take 100 mg by mouth daily as needed for erectile dysfunction. ) 5 tablet 11  . sitaGLIPtin (JANUVIA) 100 MG tablet Take 1 tablet (100 mg total) by mouth daily. 90 tablet 3  . valACYclovir (VALTREX) 1000 MG tablet Take 1-2 tablets (1,000-2,000 mg total) by mouth See admin instructions. Take 1-2 tablets (1000 -2000 mg) by mouth every 12 hours for 2-3 days as needed for fever blisters 90 tablet 3   No current facility-administered medications on file prior to visit.    Allergies:   Allergies  Allergen Reactions  . Pravachol [Pravastatin Sodium] Other (See Comments)    Muscle aches      OBJECTIVE:  Physical Exam  Vitals:   01/04/20 1541  BP: 111/67  Pulse: (!) 58  Weight: 242 lb (109.8 kg)  Height: 6' (1.829 m)   Body mass index is 32.82 kg/m. No exam data present  General: well developed, well nourished, pleasant middle-age Caucasian male, seated, in no evident distress Head: head normocephalic and atraumatic.   Neck: supple with no carotid or supraclavicular bruits Cardiovascular: regular rate and rhythm, no murmurs Musculoskeletal: no deformity Skin:  no rash/petichiae Vascular:  Normal pulses all extremities   Neurologic Exam Mental Status: Awake and fully alert.   Fluent speech and language.  Oriented to place and time. Recent and remote memory intact. Attention span, concentration and fund of knowledge  appropriate. Mood and affect appropriate.  Cranial Nerves: Pupils equal, briskly reactive to light. Extraocular movements full without nystagmus. Visual fields full to confrontation. Hearing intact. Facial sensation intact. Face, tongue, palate moves normally and symmetrically.  Motor: Normal bulk and tone. Normal strength in all tested extremity muscles. Sensory.: intact to touch , pinprick , position and vibratory sensation.  Coordination: Rapid alternating movements normal in all extremities. Finger-to-nose and heel-to-shin performed accurately bilaterally. Gait and Station: Arises from chair without difficulty. Stance is normal. Gait demonstrates normal stride length and balance without use of assistive device. Reflexes: 1+ and symmetric. Toes downgoing.      ASSESSMENT: Karl Morgan is a 59 y.o. year old male presented with acute dizziness, "head buzzing" and gait instability on 07/31/2019 with stroke work-up revealing acute infarct right superior cerebellum s/p tPA with superimposed hemorrhagic transformation within the infarct, infarct secondary to unknown source.  Prior history of cryptogenic stroke 2016 and has continued on DAPT since that time.  Loop recorder placed to rule out atrial fibrillation.  Vascular risk factors include prior stroke, HTN, HLD, DM, CAD w/ MI, and history of substance abuse.       PLAN:  1. Cryptogenic right cerebellum stroke:  a. Recovered well without residual deficits.  Occasional dizziness appearing to be positional likely more related to blood pressure. b. Continue aspirin 81 mg daily and clopidogrel 75 mg daily  and atorvastatin 80 mg daily for secondary stroke prevention.  Advised patient that prolonged DAPT duration not indicated from a stroke standpoint but continues on DAPT per cardiology recommendations Karl. Loop recorder has not shown atrial fibrillation thus far - will continue to be monitored  by cardiology.   d. Discussed secondary stroke  prevention measures and importance of close PCP/cardiology follow-up for aggressive stroke risk factor management 2. HTN: BP goal<130/90.  Stable.  On lisinopril and metoprolol per PCP/cardiology 3. HLD: LDL goal<70. On atorvastatin 80 mg daily and Zetia per cardiology/PCP 4. DMII: Uncontrolled with recent A1c 8.9.  A1c goal<7.0. On Januvia, Metformin and glimepiride per endocrinology.  Recently established care with endocrinology    Overall stable from stroke standpoint routinely followed by multiple providers therefore recommend follow-up on a as needed basis   I spent 30 minutes of face-to-face and non-face-to-face time with patient.  This included previsit chart review, lab review, study review, order entry, electronic health record documentation, patient education regarding cryptogenic stroke, loop recorder review, importance of managing stroke risk factors and answered all questions to patient satisfaction   Frann Rider, [facility]  [facility] [address] [place], [place] [address]  Phone [phone] Fax [phone] Note: This document was prepared with digital dictation and possible smart phrase technology. Any transcriptional errors that result from this process are unintentional.

## 2020-01-04 NOTE — Patient Instructions (Signed)
Continue aspirin 81 mg daily and clopidogrel 75 mg daily  and atovastatin  for secondary stroke prevention  Continue to follow up with PCP regarding cholesterol and blood pressure management  Continue to follow with endocrinology for diabetic management Maintain strict control of hypertension with blood pressure goal below 130/90, diabetes with hemoglobin A1c goal below 7.0% and cholesterol with LDL cholesterol (bad cholesterol) goal below 70 mg/dL.    Overall stable from stroke standpoint and recommend follow up on a as needed basis      Thank you for coming to see [place] at [facility]. I hope we have been able to provide you high quality care today.  You may receive a patient satisfaction survey over the next few weeks. We would appreciate your feedback and comments so that we may continue to improve ourselves and the health of our patients.

## 2020-01-05 NOTE — Progress Notes (Signed)
Carelink Summary Report / Loop Recorder 

## 2020-01-08 ENCOUNTER — Telehealth: Payer: Self-pay | Admitting: Internal Medicine

## 2020-01-08 NOTE — Telephone Encounter (Signed)
Faxed to [employer] for a care consideration for diabetes faxed on 01-07-20

## 2020-01-22 DIAGNOSIS — E119 Type 2 diabetes mellitus without complications: Secondary | ICD-10-CM | POA: Diagnosis not present

## 2020-01-22 LAB — HM DIABETES EYE EXAM

## 2020-01-27 ENCOUNTER — Ambulatory Visit: Payer: 59 | Admitting: Dermatology

## 2020-01-31 ENCOUNTER — Encounter: Payer: Self-pay | Admitting: Internal Medicine

## 2020-02-02 ENCOUNTER — Other Ambulatory Visit: Payer: Self-pay | Admitting: Family Medicine

## 2020-02-02 DIAGNOSIS — Z03818 Encounter for observation for suspected exposure to other biological agents ruled out: Secondary | ICD-10-CM | POA: Diagnosis not present

## 2020-02-02 DIAGNOSIS — J019 Acute sinusitis, unspecified: Secondary | ICD-10-CM | POA: Diagnosis not present

## 2020-02-03 LAB — CUP PACEART REMOTE DEVICE CHECK
Date Time Interrogation Session: 20211101230336
Implantable Pulse Generator Implant Date: 20210503

## 2020-02-08 ENCOUNTER — Ambulatory Visit (INDEPENDENT_AMBULATORY_CARE_PROVIDER_SITE_OTHER): Payer: 59

## 2020-02-08 DIAGNOSIS — I6340 Cerebral infarction due to embolism of unspecified cerebral artery: Secondary | ICD-10-CM | POA: Diagnosis not present

## 2020-02-08 NOTE — Progress Notes (Signed)
Carelink Summary Report / Loop Recorder 

## 2020-02-24 ENCOUNTER — Telehealth: Payer: Self-pay | Admitting: Internal Medicine

## 2020-02-24 NOTE — Telephone Encounter (Signed)
Rejection Reason - Patient Declined" [place] said on Feb 24, 2020 12:48 PM

## 2020-03-12 LAB — CUP PACEART REMOTE DEVICE CHECK
Date Time Interrogation Session: 20211204230404
Implantable Pulse Generator Implant Date: 20210503

## 2020-03-14 ENCOUNTER — Ambulatory Visit (INDEPENDENT_AMBULATORY_CARE_PROVIDER_SITE_OTHER): Payer: 59

## 2020-03-14 DIAGNOSIS — I6340 Cerebral infarction due to embolism of unspecified cerebral artery: Secondary | ICD-10-CM | POA: Diagnosis not present

## 2020-03-15 ENCOUNTER — Other Ambulatory Visit: Payer: Self-pay | Admitting: Unknown Physician Specialty

## 2020-03-28 NOTE — Progress Notes (Signed)
Carelink Summary Report / Loop Recorder 

## 2020-04-06 DIAGNOSIS — I152 Hypertension secondary to endocrine disorders: Secondary | ICD-10-CM | POA: Diagnosis not present

## 2020-04-06 DIAGNOSIS — E230 Hypopituitarism: Secondary | ICD-10-CM | POA: Diagnosis not present

## 2020-04-06 DIAGNOSIS — E1159 Type 2 diabetes mellitus with other circulatory complications: Secondary | ICD-10-CM | POA: Diagnosis not present

## 2020-04-14 ENCOUNTER — Other Ambulatory Visit: Payer: Self-pay | Admitting: "Endocrinology

## 2020-04-14 DIAGNOSIS — I152 Hypertension secondary to endocrine disorders: Secondary | ICD-10-CM | POA: Diagnosis not present

## 2020-04-14 DIAGNOSIS — E1169 Type 2 diabetes mellitus with other specified complication: Secondary | ICD-10-CM | POA: Diagnosis not present

## 2020-04-14 DIAGNOSIS — E1159 Type 2 diabetes mellitus with other circulatory complications: Secondary | ICD-10-CM | POA: Diagnosis not present

## 2020-04-14 DIAGNOSIS — E119 Type 2 diabetes mellitus without complications: Secondary | ICD-10-CM | POA: Diagnosis not present

## 2020-04-18 ENCOUNTER — Ambulatory Visit (INDEPENDENT_AMBULATORY_CARE_PROVIDER_SITE_OTHER): Payer: 59

## 2020-04-18 DIAGNOSIS — I6340 Cerebral infarction due to embolism of unspecified cerebral artery: Secondary | ICD-10-CM | POA: Diagnosis not present

## 2020-04-18 LAB — CUP PACEART REMOTE DEVICE CHECK
Date Time Interrogation Session: 20220115230554
Implantable Pulse Generator Implant Date: 20210503

## 2020-04-20 DIAGNOSIS — Z86010 Personal history of colonic polyps: Secondary | ICD-10-CM | POA: Diagnosis not present

## 2020-04-20 DIAGNOSIS — Z1211 Encounter for screening for malignant neoplasm of colon: Secondary | ICD-10-CM | POA: Diagnosis not present

## 2020-04-20 LAB — HM COLONOSCOPY

## 2020-04-30 NOTE — Progress Notes (Signed)
Carelink Summary Report / Loop Recorder 

## 2020-05-10 ENCOUNTER — Other Ambulatory Visit: Payer: Self-pay | Admitting: Internal Medicine

## 2020-05-10 ENCOUNTER — Encounter: Payer: Self-pay | Admitting: Internal Medicine

## 2020-05-10 ENCOUNTER — Ambulatory Visit: Payer: 59 | Admitting: Internal Medicine

## 2020-05-10 ENCOUNTER — Other Ambulatory Visit: Payer: Self-pay

## 2020-05-10 VITALS — BP 124/82 | HR 68 | Temp 98.0°F | Ht 72.0 in | Wt 239.8 lb

## 2020-05-10 DIAGNOSIS — E1165 Type 2 diabetes mellitus with hyperglycemia: Secondary | ICD-10-CM | POA: Diagnosis not present

## 2020-05-10 DIAGNOSIS — R7989 Other specified abnormal findings of blood chemistry: Secondary | ICD-10-CM | POA: Diagnosis not present

## 2020-05-10 DIAGNOSIS — I10 Essential (primary) hypertension: Secondary | ICD-10-CM | POA: Diagnosis not present

## 2020-05-10 MED ORDER — SITAGLIPTIN PHOSPHATE 100 MG PO TABS: 100.0000 mg | ORAL_TABLET | Freq: Every day | ORAL | 3 refills | Status: DC

## 2020-05-10 MED ORDER — LISINOPRIL 40 MG PO TABS: 40.0000 mg | ORAL_TABLET | Freq: Every day | ORAL | 3 refills | Status: DC

## 2020-05-10 MED ORDER — GLIMEPIRIDE 2 MG PO TABS: 2.0000 mg | ORAL_TABLET | Freq: Two times a day (BID) | ORAL | 3 refills | Status: DC

## 2020-05-10 NOTE — Progress Notes (Addendum)
Chief Complaint  Patient presents with  . Follow-up   F/u  1. HTN improved on lis 40 mg qd toprol 50 mg qd xl improved   2. Dm 2 uncontrolled A1C 9.2 04/2020 04/06/20 on farxiga 10 mg qd, metformin 850 mg bid, lipitor 80 mg qhs, zetia 10 mg qd, amaryl 2 mg bid Januvia, farxiga 10 mg qd  He is working out 15 minutes daily treadmill since Xmas  3. Elevated TSH tsh 5.524 on levo 50 mcg qd per [facility] endocrine Dr. Honor Junes f/u labs 07/13/20 and f/u 07/20/20   Review of Systems  Constitutional: Negative for weight loss.  HENT: Negative for hearing loss.   Eyes: Negative for blurred vision.  Respiratory: Negative for shortness of breath.   Cardiovascular: Negative for chest pain.  Gastrointestinal: Negative for abdominal pain.  Musculoskeletal: Negative for falls and joint pain.  Skin: Negative for rash.  Neurological: Negative for headaches.  Psychiatric/Behavioral: Negative for depression and memory loss.   Past Medical History:  Diagnosis Date  . Alopecia 04/04/2011  . Diabetes mellitus   . Erectile dysfunction 04/04/2011  . Heart murmur   . Hyperlipidemia 04/04/2011  . Hypogonadism male 04/04/2011  . Myocardial infarction ([place])   . Stroke ([place])   . Substance abuse ([place])   . Type II or unspecified type diabetes mellitus without mention of complication, uncontrolled 04/04/2011  . Unspecified essential hypertension 04/04/2011   Past Surgical History:  Procedure Laterality Date  . BUBBLE STUDY  08/03/2019   Procedure: BUBBLE STUDY;  Surgeon: Fay Records, MD;  Location: [place];  Service: Cardiovascular;;  . CARDIAC CATHETERIZATION    . LOOP RECORDER INSERTION N/A 08/03/2019   Procedure: LOOP RECORDER INSERTION;  Surgeon: Evans Lance, MD;  Location: [place] CV LAB;  Service: Cardiovascular;  Laterality: N/A;  . TEE WITHOUT CARDIOVERSION N/A 08/03/2019   Procedure: TRANSESOPHAGEAL ECHOCARDIOGRAM (TEE);  Surgeon: Fay Records, MD;  Location: [facility] ENDOSCOPY;  Service: Cardiovascular;   Laterality: N/A;   Family History  Problem Relation Age of Onset  . Hypertension Father   . Dementia Father        died age 51 in 2017/06/10  . Memory loss Father   . Emphysema Mother   . COPD Mother   . Thyroid disease Mother   . Cancer Neg Hx   . Diabetes Neg Hx   . Heart disease Neg Hx   . Stroke Neg Hx    Social History   Socioeconomic History  . Marital status: Married    Spouse name: Not on file  . Number of children: Not on file  . Years of education: Not on file  . Highest education level: Not on file  Occupational History  . Occupation: OPERATIONS MANAGER of maintenance    Employer: [employer]  Tobacco Use  . Smoking status: Never Smoker  . Smokeless tobacco: Never Used  Vaping Use  . Vaping Use: Never used  Substance and Sexual Activity  . Alcohol use: No    Comment: former etoh abuse in 76s   . Drug use: No  . Sexual activity: Yes  Other Topics Concern  . Not on file  Social History Narrative   Caffienated drinks-no   Seat belt use often-yes   Regular Exercise-no   Smoke alarm in the home-yes   Firearms/guns in the home-yes   History of physical abuse-no      Married 2 kids daughter and son    Oldest of siblings has 2 brothers and  1 sister    Never smoker    Works Physicist, medical and [employer] wants to retire at 60 y.o. works in [place] wife Vickie             Social Determinants of Radio broadcast assistant Strain: Not on [employer] Insecurity: Not on file  Transportation Needs: Not on file  Physical Activity: Not on file  Stress: Not on file  Social Connections: Not on file  Intimate Partner Violence: Not on file   Current Meds  Medication Sig  . alprostadil (EDEX) 20 MCG injection 20 mcg by Intracavitary route as needed for erectile dysfunction. use no more than 3 times per week  . aspirin EC 81 MG tablet Take 1 tablet (81 mg total) by mouth daily. STOP TAKING after 21 days and continue PLAVIX  ALONE  . atorvastatin (LIPITOR) 80 MG tablet Take 1 tablet (80 mg total) by mouth daily at 6 PM.  . blood glucose meter kit and supplies Dispense based on patient and insurance preference. Use up to bid daily as directed. (FOR ICD-10 E10.9, E11.9). 1 year supply of lancets and strips  . clomiPHENE (CLOMID) 50 MG tablet Take 50 mg by mouth 3 (three) times a week.   . clopidogrel (PLAVIX) 75 MG tablet Take 1 tablet (75 mg total) by mouth daily.  . Continuous Blood Gluc Receiver (FREESTYLE LIBRE 14 DAY READER) DEVI 1 Device by Does not apply route in the morning and at bedtime.  . Continuous Blood Gluc Sensor (FREESTYLE LIBRE 14 DAY SENSOR) MISC 1 Device by Does not apply route in the morning and at bedtime.  . dapagliflozin propanediol (FARXIGA) 10 MG TABS tablet Take 10 mg by mouth in the morning.  . ezetimibe (ZETIA) 10 MG tablet Take 1 tablet (10 mg total) by mouth daily.  Marland Kitchen FREESTYLE LITE test strip 1 each 3 (three) times daily.  . Lancets (FREESTYLE) lancets 1 each 3 (three) times daily.  Marland Kitchen LEVOTHYROXINE SODIUM PO Take 50 mcg by mouth daily. 30 minutes before food  . metFORMIN (GLUCOPHAGE) 850 MG tablet Take 1 tablet (850 mg total) by mouth daily with breakfast. (Patient taking differently: Take 850 mg by mouth daily with breakfast. BID)  . metoprolol succinate (TOPROL-XL) 50 MG 24 hr tablet Take 1 tablet (50 mg total) by mouth daily. Take with or immediately following a meal.  . omeprazole (PRILOSEC OTC) 20 MG tablet Take 20 mg by mouth daily as needed (acid reflux/heartburn).  . sildenafil (VIAGRA) 100 MG tablet Take 0.5-1 tablets (50-100 mg total) by mouth daily as needed for erectile dysfunction. (Patient taking differently: Take 100 mg by mouth daily as needed for erectile dysfunction.)  . valACYclovir (VALTREX) 1000 MG tablet Take 1-2 tablets (1,000-2,000 mg total) by mouth See admin instructions. Take 1-2 tablets (1000 -2000 mg) by mouth every 12 hours for 2-3 days as needed for fever  blisters  . [DISCONTINUED] glimepiride (AMARYL) 2 MG tablet Take 1 tablet (2 mg total) by mouth 2 (two) times daily with a meal.  . [DISCONTINUED] lisinopril (ZESTRIL) 40 MG tablet Take 1 tablet (40 mg total) by mouth daily.  . [DISCONTINUED] sitaGLIPtin (JANUVIA) 100 MG tablet Take 1 tablet (100 mg total) by mouth daily.   Allergies  Allergen Reactions  . Pravachol [Pravastatin Sodium] Other (See Comments)    Muscle aches   Recent Results (from the past 2160 hour(s))  CUP PACEART REMOTE DEVICE  CHECK     Status: None   Collection Time: 03/05/20 11:04 PM  Result Value Ref Range   Date Time Interrogation Session [id number]    Pulse Generator Manufacturer MERM    Pulse Gen Model OEV03 JKKX II    Pulse Gen Serial Number [id number] [place] [facility] Name [facility]    Implantable Pulse Generator Type ICM/ILR    Implantable Pulse Generator Implant Date [id number]   CUP PACEART REMOTE DEVICE CHECK     Status: None   Collection Time: 04/16/20 11:05 PM  Result Value Ref Range   Date Time Interrogation Session [id number]    Pulse Generator Manufacturer MERM    Pulse Gen Model LNQ22 [id number] II    Pulse Gen Serial Number [id number] [place] [facility] Name [facility]    Implantable Pulse Generator Type ICM/ILR    Implantable Pulse Generator Implant Date [id number]    Objective  Body mass index is 32.52 kg/m. Wt Readings from Last 3 Encounters:  05/10/20 239 lb 12.8 oz (108.8 kg)  01/04/20 242 lb (109.8 kg)  11/06/19 241 lb 3.2 oz (109.4 kg)   Temp Readings from Last 3 Encounters:  05/10/20 98 F (36.7 C) (Oral)  11/06/19 98.5 F (36.9 C)  08/06/19 (!) 97.3 F (36.3 C) (Temporal)   BP Readings from Last 3 Encounters:  05/10/20 124/82  01/04/20 111/67  11/06/19 132/82   Pulse Readings from Last 3 Encounters:  05/10/20 68  01/04/20 (!) 58  11/06/19 69    Physical Exam Vitals and nursing note reviewed.  Constitutional:      Appearance: Normal appearance. He is well-developed and  well-groomed. He is obese.  HENT:     Head: Normocephalic and atraumatic.     Right Ear: There is impacted cerumen.     Left Ear: There is impacted cerumen.  Eyes:     Conjunctiva/sclera: Conjunctivae normal.     Pupils: Pupils are equal, round, and reactive to light.  Cardiovascular:     Rate and Rhythm: Normal rate and regular rhythm.     Heart sounds: Normal heart sounds. No murmur heard.   Pulmonary:     Effort: Pulmonary effort is normal.     Breath sounds: Normal breath sounds.  Abdominal:     Tenderness: There is no abdominal tenderness.  Skin:    General: Skin is warm and dry.  Neurological:     General: No focal deficit present.     Mental Status: He is alert and oriented to person, place, and time. Mental status is at baseline.     Gait: Gait normal.  Psychiatric:        Attention and Perception: Attention and perception normal.        Mood and Affect: Mood and affect normal.        Speech: Speech normal.        Behavior: Behavior normal. Behavior is cooperative.        Thought Content: Thought content normal.        Cognition and Memory: Cognition and memory normal.        Judgment: Judgment normal.     Assessment  Plan  Essential hypertension - Plan: lisinopril (ZESTRIL) 40 MG tablet, toprol xl 50 mg qd  Monitor BP  Uncontrolled type 2 diabetes mellitus with hyperglycemia (HCC) - Plan: glimepiride (AMARYL) 2 MG tablet bid , januvia 100 mg qd metformin 850 mg bid, farxiga 10 mg qd f/u 07/13/20 and 07/20/20   Elevated TSH  On levo 50 mcg qd   HM Flu shot at work10/04/2019  tdap had 06/25/15 Disc shingrix today to consider in the futurewants to wait until covid 19 vaccine comes out and he gts this  Consider shingrix  pna 23 had 04/05/14 due 04/2019 Pfizer 3/3 had PSA, ED, low T with Dr. Tonette Bihari [facility] saw w/in the last few months 2020 get records 09/15/18 [facility] appt low T PSA 09/15/18 1.28, T was 1003.6 -on testosterone Q1-2 weeks  per urology -appt 12/30/19 seen Dr. Diona Fanti PSA 09/23/19 PSA 1.01 and testosterone 232 as of 12/30/19 stopped testosterone on clomid rec f/u endocrine  Colonoscopy 02/04/15 Dr. Melina Copa diverticulosis f/u in 5 years [facility] 11/2021pt to call and schedule  -sent ROI Dr. Melina Copa 04/20/20 diverticulosis otherwise normal f/u in 10 year Dr. Nehemiah Settle [facility] 08/22/10 +polyps   Eye exam 01/2020 consider f/u before fall 2022   Hep C negative  Dermatology Dr. Nehemiah Massed due to f/u in 10 or 11/2020pt will call for appt as of 02/24/2019  -seen [facility] in 2021 [facility] Dr. Michele Mcalpine 11/26/19 [place], [place], alopecia totalis rec wheat belly diet  Never smoker  Former etoh abuse in 58s but quit in 50s   rec healthy diet and exercise  Urology as of 2021 had prostate exam f/u Dr. Marzetta Board   Provider: Dr. Olivia Mackie McLean-[facility]ocuz[facility]

## 2020-05-10 NOTE — Patient Instructions (Addendum)
Consider shingrix vaccine x 2 doses and prevnar 13 vaccine   Pneumococcal Conjugate Vaccine (Prevnar 13) Suspension for Injection-consider this in the future   What is this medicine? PNEUMOCOCCAL VACCINE (NEU mo KOK al vak SEEN) is a vaccine used to prevent pneumococcus bacterial infections. These bacteria can cause serious infections like pneumonia, meningitis, and blood infections. This vaccine will lower your chance of getting pneumonia. If you do get pneumonia, it can make your symptoms milder and your illness shorter. This vaccine will not treat an infection and will not cause infection. This vaccine is recommended for infants and young children, adults with certain medical conditions, and adults 36 years or older. This medicine may be used for other purposes; ask your health care provider or pharmacist if you have questions. COMMON BRAND NAME(S): Prevnar, Prevnar 13 What should I tell my health care provider before I take this medicine? They need to know if you have any of these conditions:  bleeding problems  fever  immune system problems  an unusual or allergic reaction to pneumococcal vaccine, diphtheria toxoid, other vaccines, latex, other medicines, foods, dyes, or preservatives  pregnant or trying to get pregnant  breast-feeding How should I use this medicine? This vaccine is for injection into a muscle. It is given by a health care professional. A copy of Vaccine Information Statements will be given before each vaccination. Read this sheet carefully each time. The sheet may change frequently. Talk to your pediatrician regarding the use of this medicine in children. While this drug may be prescribed for children as young as 55 weeks old for selected conditions, precautions do apply. Overdosage: If you think you have taken too much of this medicine contact a poison control center or emergency room at once. NOTE: This medicine is only for you. Do not share this medicine with  others. What if I miss a dose? It is important not to miss your dose. Call your doctor or health care professional if you are unable to keep an appointment. What may interact with this medicine?  medicines for cancer chemotherapy  medicines that suppress your immune function  steroid medicines like prednisone or cortisone This list may not describe all possible interactions. Give your health care provider a list of all the medicines, herbs, non-prescription drugs, or dietary supplements you use. Also tell them if you smoke, drink alcohol, or use illegal drugs. Some items may interact with your medicine. What should I watch for while using this medicine? Mild fever and pain should go away in 3 days or less. Report any unusual symptoms to your doctor or health care professional. What side effects may I notice from receiving this medicine? Side effects that you should report to your doctor or health care professional as soon as possible:  allergic reactions like skin rash, itching or hives, swelling of the face, lips, or tongue  breathing problems  confused  fast or irregular heartbeat  fever over 102 degrees F  seizures  unusual bleeding or bruising  unusual muscle weakness Side effects that usually do not require medical attention (report to your doctor or health care professional if they continue or are bothersome):  aches and pains  diarrhea  fever of 102 degrees F or less  headache  irritable  loss of appetite  pain, tender at site where injected  trouble sleeping This list may not describe all possible side effects. Call your doctor for medical advice about side effects. You may report side effects to FDA at 1-800-FDA-1088.  Where should I keep my medicine? This does not apply. This vaccine is given in a clinic, pharmacy, doctor's office, or other health care setting and will not be stored at home. NOTE: This sheet is a summary. It may not cover all possible  information. If you have questions about this medicine, talk to your doctor, pharmacist, or health care provider.  2021 Elsevier/Gold Standard (2013-12-24 10:27:27)

## 2020-05-22 LAB — CUP PACEART REMOTE DEVICE CHECK
Date Time Interrogation Session: 20220217230621
Implantable Pulse Generator Implant Date: 20210503

## 2020-05-23 ENCOUNTER — Ambulatory Visit (INDEPENDENT_AMBULATORY_CARE_PROVIDER_SITE_OTHER): Payer: 59

## 2020-05-23 DIAGNOSIS — I6340 Cerebral infarction due to embolism of unspecified cerebral artery: Secondary | ICD-10-CM | POA: Diagnosis not present

## 2020-05-27 NOTE — Progress Notes (Signed)
Carelink Summary Report / Loop Recorder 

## 2020-06-01 DIAGNOSIS — N5201 Erectile dysfunction due to arterial insufficiency: Secondary | ICD-10-CM | POA: Diagnosis not present

## 2020-06-01 DIAGNOSIS — E291 Testicular hypofunction: Secondary | ICD-10-CM | POA: Diagnosis not present

## 2020-06-01 NOTE — Telephone Encounter (Signed)
Faxed to [employer] [facility] for a care consideration for diabetes faxed on 05-31-20

## 2020-06-25 LAB — CUP PACEART REMOTE DEVICE CHECK
Date Time Interrogation Session: 20220322230247
Implantable Pulse Generator Implant Date: 20210503

## 2020-06-27 ENCOUNTER — Ambulatory Visit (INDEPENDENT_AMBULATORY_CARE_PROVIDER_SITE_OTHER): Payer: 59

## 2020-06-27 DIAGNOSIS — I6340 Cerebral infarction due to embolism of unspecified cerebral artery: Secondary | ICD-10-CM | POA: Diagnosis not present

## 2020-07-05 ENCOUNTER — Other Ambulatory Visit: Payer: Self-pay

## 2020-07-05 MED ORDER — VALACYCLOVIR HCL 1 G PO TABS
ORAL_TABLET | ORAL | 1 refills | Status: DC
  Filled 2020-07-05: qty 90, 30d supply, fill #0

## 2020-07-06 NOTE — Progress Notes (Signed)
Carelink Summary Report / Loop Recorder 

## 2020-07-12 ENCOUNTER — Other Ambulatory Visit: Payer: Self-pay

## 2020-07-12 DIAGNOSIS — J019 Acute sinusitis, unspecified: Secondary | ICD-10-CM | POA: Diagnosis not present

## 2020-07-12 DIAGNOSIS — Z03818 Encounter for observation for suspected exposure to other biological agents ruled out: Secondary | ICD-10-CM | POA: Diagnosis not present

## 2020-07-12 DIAGNOSIS — B9689 Other specified bacterial agents as the cause of diseases classified elsewhere: Secondary | ICD-10-CM | POA: Diagnosis not present

## 2020-07-12 DIAGNOSIS — R059 Cough, unspecified: Secondary | ICD-10-CM | POA: Diagnosis not present

## 2020-07-12 MED ORDER — CEFDINIR 300 MG PO CAPS
ORAL_CAPSULE | ORAL | 0 refills | Status: DC
  Filled 2020-07-12: qty 14, 7d supply, fill #0

## 2020-07-12 MED ORDER — AZELASTINE HCL 0.1 % NA SOLN
NASAL | 1 refills | Status: DC
  Filled 2020-07-12: qty 30, 50d supply, fill #0

## 2020-07-12 MED ORDER — BENZONATATE 200 MG PO CAPS
ORAL_CAPSULE | ORAL | 1 refills | Status: DC
  Filled 2020-07-12: qty 30, 10d supply, fill #0

## 2020-07-12 MED ORDER — HYDROCOD POLST-CPM POLST ER 10-8 MG/5ML PO SUER
ORAL | 0 refills | Status: DC
  Filled 2020-07-12: qty 120, 12d supply, fill #0

## 2020-07-12 MED FILL — Dapagliflozin Propanediol Tab 10 MG (Base Equivalent): ORAL | 30 days supply | Qty: 30 | Fill #0 | Status: AC

## 2020-07-13 DIAGNOSIS — E1169 Type 2 diabetes mellitus with other specified complication: Secondary | ICD-10-CM | POA: Diagnosis not present

## 2020-07-13 DIAGNOSIS — E785 Hyperlipidemia, unspecified: Secondary | ICD-10-CM | POA: Diagnosis not present

## 2020-07-20 ENCOUNTER — Other Ambulatory Visit: Payer: Self-pay

## 2020-07-20 DIAGNOSIS — I152 Hypertension secondary to endocrine disorders: Secondary | ICD-10-CM | POA: Diagnosis not present

## 2020-07-20 DIAGNOSIS — E1169 Type 2 diabetes mellitus with other specified complication: Secondary | ICD-10-CM | POA: Diagnosis not present

## 2020-07-20 DIAGNOSIS — E119 Type 2 diabetes mellitus without complications: Secondary | ICD-10-CM | POA: Diagnosis not present

## 2020-07-20 DIAGNOSIS — E1159 Type 2 diabetes mellitus with other circulatory complications: Secondary | ICD-10-CM | POA: Diagnosis not present

## 2020-07-20 MED ORDER — RYBELSUS 3 MG PO TABS
ORAL_TABLET | ORAL | 0 refills | Status: DC
  Filled 2020-07-20 – 2020-07-21 (×2): qty 30, 30d supply, fill #0

## 2020-07-20 MED ORDER — RYBELSUS 7 MG PO TABS
ORAL_TABLET | ORAL | 6 refills | Status: DC
  Filled 2020-08-24: qty 30, 30d supply, fill #0

## 2020-07-21 ENCOUNTER — Other Ambulatory Visit: Payer: Self-pay

## 2020-07-25 ENCOUNTER — Ambulatory Visit (INDEPENDENT_AMBULATORY_CARE_PROVIDER_SITE_OTHER): Payer: 59

## 2020-07-25 DIAGNOSIS — I6340 Cerebral infarction due to embolism of unspecified cerebral artery: Secondary | ICD-10-CM

## 2020-07-27 LAB — CUP PACEART REMOTE DEVICE CHECK
Date Time Interrogation Session: 20220424230143
Implantable Pulse Generator Implant Date: 20210503

## 2020-07-28 DIAGNOSIS — L820 Inflamed seborrheic keratosis: Secondary | ICD-10-CM | POA: Diagnosis not present

## 2020-07-28 DIAGNOSIS — L821 Other seborrheic keratosis: Secondary | ICD-10-CM | POA: Diagnosis not present

## 2020-07-28 DIAGNOSIS — L578 Other skin changes due to chronic exposure to nonionizing radiation: Secondary | ICD-10-CM | POA: Diagnosis not present

## 2020-08-04 ENCOUNTER — Other Ambulatory Visit: Payer: Self-pay

## 2020-08-04 MED FILL — Atorvastatin Calcium Tab 80 MG (Base Equivalent): ORAL | 90 days supply | Qty: 90 | Fill #0 | Status: AC

## 2020-08-08 ENCOUNTER — Other Ambulatory Visit: Payer: Self-pay | Admitting: Internal Medicine

## 2020-08-08 ENCOUNTER — Other Ambulatory Visit: Payer: Self-pay

## 2020-08-08 DIAGNOSIS — E785 Hyperlipidemia, unspecified: Secondary | ICD-10-CM

## 2020-08-08 MED ORDER — EZETIMIBE 10 MG PO TABS
ORAL_TABLET | Freq: Every day | ORAL | 3 refills | Status: DC
  Filled 2020-08-08: qty 90, 90d supply, fill #0
  Filled 2020-10-31: qty 90, 90d supply, fill #1

## 2020-08-08 MED FILL — Metformin HCl Tab 850 MG: ORAL | 90 days supply | Qty: 180 | Fill #0 | Status: AC

## 2020-08-08 MED FILL — Clomiphene Citrate Tab 50 MG: ORAL | 90 days supply | Qty: 45 | Fill #0 | Status: AC

## 2020-08-08 MED FILL — Dapagliflozin Propanediol Tab 10 MG (Base Equivalent): ORAL | 90 days supply | Qty: 90 | Fill #1 | Status: AC

## 2020-08-08 MED FILL — Lisinopril Tab 40 MG: ORAL | 90 days supply | Qty: 90 | Fill #0 | Status: AC

## 2020-08-10 NOTE — Progress Notes (Signed)
Carelink Summary Report / Loop Recorder 

## 2020-08-15 NOTE — Addendum Note (Signed)
Addended by: Douglass Rivers D on: 08/15/2020 11:14 AM   Modules accepted: Level of Service

## 2020-08-17 ENCOUNTER — Telehealth: Payer: Self-pay | Admitting: Internal Medicine

## 2020-08-17 NOTE — Telephone Encounter (Signed)
Faxed care consideration on 08-16-20 for diabetes eye exam to [employer] [phone]

## 2020-08-24 ENCOUNTER — Other Ambulatory Visit: Payer: Self-pay

## 2020-08-25 ENCOUNTER — Other Ambulatory Visit: Payer: Self-pay

## 2020-08-25 MED ORDER — CLOPIDOGREL BISULFATE 75 MG PO TABS
1.0000 | ORAL_TABLET | Freq: Every day | ORAL | 3 refills | Status: DC
  Filled 2020-08-25: qty 90, 90d supply, fill #0
  Filled 2020-11-21: qty 90, 90d supply, fill #1

## 2020-08-29 LAB — CUP PACEART REMOTE DEVICE CHECK
Date Time Interrogation Session: 20220527230141
Implantable Pulse Generator Implant Date: 20210503

## 2020-08-30 ENCOUNTER — Ambulatory Visit (INDEPENDENT_AMBULATORY_CARE_PROVIDER_SITE_OTHER): Payer: 59

## 2020-08-30 DIAGNOSIS — I6340 Cerebral infarction due to embolism of unspecified cerebral artery: Secondary | ICD-10-CM | POA: Diagnosis not present

## 2020-08-31 ENCOUNTER — Other Ambulatory Visit: Payer: Self-pay

## 2020-08-31 ENCOUNTER — Other Ambulatory Visit: Payer: Self-pay | Admitting: Internal Medicine

## 2020-08-31 MED FILL — Metoprolol Succinate Tab ER 24HR 50 MG (Tartrate Equiv): ORAL | 90 days supply | Qty: 90 | Fill #0 | Status: AC

## 2020-09-05 DIAGNOSIS — I25119 Atherosclerotic heart disease of native coronary artery with unspecified angina pectoris: Secondary | ICD-10-CM | POA: Diagnosis not present

## 2020-09-05 DIAGNOSIS — I639 Cerebral infarction, unspecified: Secondary | ICD-10-CM | POA: Diagnosis not present

## 2020-09-05 DIAGNOSIS — I2510 Atherosclerotic heart disease of native coronary artery without angina pectoris: Secondary | ICD-10-CM | POA: Diagnosis not present

## 2020-09-05 DIAGNOSIS — I208 Other forms of angina pectoris: Secondary | ICD-10-CM | POA: Diagnosis not present

## 2020-09-07 ENCOUNTER — Other Ambulatory Visit: Payer: Self-pay

## 2020-09-07 MED ORDER — RYBELSUS 3 MG PO TABS
ORAL_TABLET | ORAL | 5 refills | Status: DC
  Filled 2020-09-07: qty 30, 30d supply, fill #0

## 2020-09-12 ENCOUNTER — Other Ambulatory Visit: Payer: Self-pay

## 2020-09-12 MED FILL — Levothyroxine Sodium Tab 50 MCG: ORAL | 90 days supply | Qty: 90 | Fill #0 | Status: AC

## 2020-09-21 NOTE — Progress Notes (Signed)
Carelink Summary Report / Loop Recorder 

## 2020-09-26 ENCOUNTER — Telehealth: Payer: Self-pay

## 2020-09-26 NOTE — Telephone Encounter (Signed)
[facility] alert received for tachy event.  Pt implanted for CVA 08/03/19, this is first documentation of arrhythmia.    Spoke with pt.  He denies any cardiac symptoms, states at time of episode he would have been sitting down eating lunch.    Pt meds include Metoprolol 50mg  Daily.   Pt cardiac care is normally managed by Dr. Clayborn Bigness with [facility].  Pt states he has an appt with him at end of July and will discuss if ILR transmissions should be transferred to him.

## 2020-09-27 ENCOUNTER — Other Ambulatory Visit: Payer: Self-pay

## 2020-10-04 ENCOUNTER — Ambulatory Visit (INDEPENDENT_AMBULATORY_CARE_PROVIDER_SITE_OTHER): Payer: 59

## 2020-10-04 DIAGNOSIS — I6340 Cerebral infarction due to embolism of unspecified cerebral artery: Secondary | ICD-10-CM | POA: Diagnosis not present

## 2020-10-04 LAB — CUP PACEART REMOTE DEVICE CHECK
Date Time Interrogation Session: 20220629230521
Implantable Pulse Generator Implant Date: 20210503

## 2020-10-04 NOTE — Telephone Encounter (Signed)
The arrhythmia is very brief. Lets follow.

## 2020-10-10 ENCOUNTER — Other Ambulatory Visit: Payer: Self-pay

## 2020-10-10 MED FILL — Glimepiride Tab 2 MG: ORAL | 90 days supply | Qty: 180 | Fill #0 | Status: AC

## 2020-10-12 DIAGNOSIS — I25119 Atherosclerotic heart disease of native coronary artery with unspecified angina pectoris: Secondary | ICD-10-CM | POA: Diagnosis not present

## 2020-10-12 DIAGNOSIS — I208 Other forms of angina pectoris: Secondary | ICD-10-CM | POA: Diagnosis not present

## 2020-10-21 NOTE — Progress Notes (Signed)
Carelink Summary Report / Loop Recorder 

## 2020-10-25 DIAGNOSIS — I2510 Atherosclerotic heart disease of native coronary artery without angina pectoris: Secondary | ICD-10-CM | POA: Diagnosis not present

## 2020-10-25 DIAGNOSIS — I25119 Atherosclerotic heart disease of native coronary artery with unspecified angina pectoris: Secondary | ICD-10-CM | POA: Diagnosis not present

## 2020-10-25 DIAGNOSIS — I208 Other forms of angina pectoris: Secondary | ICD-10-CM | POA: Diagnosis not present

## 2020-10-25 DIAGNOSIS — I639 Cerebral infarction, unspecified: Secondary | ICD-10-CM | POA: Diagnosis not present

## 2020-10-31 ENCOUNTER — Other Ambulatory Visit: Payer: Self-pay

## 2020-10-31 ENCOUNTER — Other Ambulatory Visit: Payer: Self-pay | Admitting: Internal Medicine

## 2020-10-31 DIAGNOSIS — E7800 Pure hypercholesterolemia, unspecified: Secondary | ICD-10-CM

## 2020-10-31 MED ORDER — ATORVASTATIN CALCIUM 80 MG PO TABS
ORAL_TABLET | ORAL | 3 refills | Status: DC
  Filled 2020-10-31: qty 90, 90d supply, fill #0

## 2020-10-31 MED FILL — Lisinopril Tab 40 MG: ORAL | 90 days supply | Qty: 90 | Fill #1 | Status: AC

## 2020-11-01 ENCOUNTER — Other Ambulatory Visit: Payer: Self-pay

## 2020-11-02 LAB — CUP PACEART REMOTE DEVICE CHECK
Date Time Interrogation Session: 20220801230403
Implantable Pulse Generator Implant Date: 20210503

## 2020-11-07 ENCOUNTER — Other Ambulatory Visit: Payer: Self-pay

## 2020-11-07 ENCOUNTER — Ambulatory Visit (INDEPENDENT_AMBULATORY_CARE_PROVIDER_SITE_OTHER): Payer: 59

## 2020-11-07 DIAGNOSIS — I6340 Cerebral infarction due to embolism of unspecified cerebral artery: Secondary | ICD-10-CM | POA: Diagnosis not present

## 2020-11-07 MED FILL — Dapagliflozin Propanediol Tab 10 MG (Base Equivalent): ORAL | 90 days supply | Qty: 90 | Fill #2 | Status: AC

## 2020-11-08 ENCOUNTER — Other Ambulatory Visit: Payer: Self-pay

## 2020-11-08 ENCOUNTER — Encounter: Payer: Self-pay | Admitting: Internal Medicine

## 2020-11-08 ENCOUNTER — Ambulatory Visit: Payer: 59 | Admitting: Internal Medicine

## 2020-11-08 VITALS — BP 122/80 | HR 66 | Temp 97.1°F | Ht 72.0 in | Wt 240.0 lb

## 2020-11-08 DIAGNOSIS — Z23 Encounter for immunization: Secondary | ICD-10-CM

## 2020-11-08 DIAGNOSIS — Z0000 Encounter for general adult medical examination without abnormal findings: Secondary | ICD-10-CM | POA: Diagnosis not present

## 2020-11-08 DIAGNOSIS — E538 Deficiency of other specified B group vitamins: Secondary | ICD-10-CM

## 2020-11-08 DIAGNOSIS — E559 Vitamin D deficiency, unspecified: Secondary | ICD-10-CM | POA: Diagnosis not present

## 2020-11-08 DIAGNOSIS — E039 Hypothyroidism, unspecified: Secondary | ICD-10-CM

## 2020-11-08 DIAGNOSIS — I152 Hypertension secondary to endocrine disorders: Secondary | ICD-10-CM | POA: Insufficient documentation

## 2020-11-08 DIAGNOSIS — E1159 Type 2 diabetes mellitus with other circulatory complications: Secondary | ICD-10-CM

## 2020-11-08 DIAGNOSIS — D649 Anemia, unspecified: Secondary | ICD-10-CM

## 2020-11-08 MED ORDER — DAPAGLIFLOZIN PROPANEDIOL 10 MG PO TABS
ORAL_TABLET | ORAL | 3 refills | Status: DC
  Filled 2020-11-08: qty 90, fill #0

## 2020-11-08 MED ORDER — METOPROLOL SUCCINATE ER 50 MG PO TB24
ORAL_TABLET | Freq: Every day | ORAL | 3 refills | Status: DC
  Filled 2020-11-08: qty 90, 90d supply, fill #0

## 2020-11-08 MED ORDER — METFORMIN HCL 850 MG PO TABS
ORAL_TABLET | Freq: Two times a day (BID) | ORAL | 3 refills | Status: DC
  Filled 2020-11-08: qty 180, 90d supply, fill #0

## 2020-11-08 MED ORDER — VALACYCLOVIR HCL 1 G PO TABS
ORAL_TABLET | ORAL | 3 refills | Status: DC
  Filled 2020-11-08: qty 90, 30d supply, fill #0

## 2020-11-08 NOTE — Progress Notes (Signed)
Chief Complaint  Patient presents with   Follow-up   Annual  1. Htn controlled with CAD, DM2 A1C 7.6 10/12/20 no chest pain on zetia 10, lipitor 80, farxiga 10 mg qd, amaryl 2 mg, lis 40 mg, metformin 850 mg qd, toprol 50 mg qd januvia 100 f/u endocrine [facility] 11/24/20 Dr. O'Connell He is exercising to lose  2. Prevnar vaccine today  3. Hypothyroidism on levo 50 mcg qd    Review of Systems  Constitutional:  Negative for weight loss.  Eyes:  Negative for blurred vision.  Respiratory:  Negative for shortness of breath.   Cardiovascular:  Negative for chest pain.  Gastrointestinal:  Negative for abdominal pain.  Musculoskeletal:  Negative for falls and joint pain.  Skin:  Negative for rash.  Neurological:  Negative for dizziness and headaches.  Psychiatric/Behavioral:  Negative for depression.   Past Medical History:  Diagnosis Date   Alopecia 04/04/2011   Diabetes mellitus    Erectile dysfunction 04/04/2011   Heart murmur    Hyperlipidemia 04/04/2011   Hypogonadism male 04/04/2011   Myocardial infarction (HCC)    Stroke (HCC)    Substance abuse (HCC)    Type II or unspecified type diabetes mellitus without mention of complication, uncontrolled 04/04/2011   Unspecified essential hypertension 04/04/2011   Past Surgical History:  Procedure Laterality Date   BUBBLE STUDY  08/03/2019   Procedure: BUBBLE STUDY;  Surgeon: Ross, Paula V, MD;  Location: [facility];  Service: Cardiovascular;;   CARDIAC CATHETERIZATION     LOOP RECORDER INSERTION N/A 08/03/2019   Procedure: LOOP RECORDER INSERTION;  Surgeon: Taylor, Gregg W, MD;  Location: [facility];  Service: Cardiovascular;  Laterality: N/A;   TEE WITHOUT CARDIOVERSION N/A 08/03/2019   Procedure: TRANSESOPHAGEAL ECHOCARDIOGRAM (TEE);  Surgeon: Ross, Paula V, MD;  Location: [facility];  Service: Cardiovascular;  Laterality: N/A;   Family History  Problem Relation Age of Onset   Hypertension Father    Dementia Father        died age 84 in  2019   Memory loss Father    Emphysema Mother    COPD Mother    Thyroid disease Mother    Cancer Neg Hx    Diabetes Neg Hx    Heart disease Neg Hx    Stroke Neg Hx    Social History   Socioeconomic History   Marital status: Married    Spouse name: Not on file   Number of children: Not on file   Years of education: Not on file   Highest education level: Not on file  Occupational History   Occupation: OPERATIONS MANAGER of maintenance    Employer: [employer]  Tobacco Use   Smoking status: Never   Smokeless tobacco: Never  Vaping Use   Vaping Use: Never used  Substance and Sexual Activity   Alcohol use: No    Comment: former etoh abuse in 1980s    Drug use: No   Sexual activity: Yes  Other Topics Concern   Not on file  Social History Narrative   Caffienated drinks-no   Seat belt use often-yes   Regular Exercise-no   Smoke alarm in the home-yes   Firearms/guns in the home-yes   History of physical abuse-no      Married 2 kids daughter and son    Oldest of siblings has 2 brothers and 1 sister    Never smoker    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 works in Education administrator       DPR wife Vickie             Social Determinants of Radio broadcast assistant Strain: Not on [employer] Insecurity: Not on file  Transportation Needs: Not on file  Physical Activity: Not on file  Stress: Not on file  Social Connections: Not on file  Intimate Partner Violence: Not on file   Current Meds  Medication Sig   alprostadil (EDEX) 20 MCG injection 20 mcg by Intracavitary route as needed for erectile dysfunction. use no more than 3 times per week   aspirin EC 81 MG tablet Take 1 tablet (81 mg total) by mouth daily. STOP TAKING after 21 days and continue PLAVIX ALONE   atorvastatin (LIPITOR) 80 MG tablet TAKE 1 TABLET BY MOUTH DAILY AT 6 PM.   blood glucose meter kit and supplies Dispense based on patient and insurance  preference. Use up to bid daily as directed. (FOR ICD-10 E10.9, E11.9). 1 year supply of lancets and strips   clomiPHENE (CLOMID) 50 MG tablet TAKE 1 TABLET BY MOUTH 3 DAYS A WEEK   clopidogrel (PLAVIX) 75 MG tablet TAKE 1 TABLET BY MOUTH ONCE DAILY   ezetimibe (ZETIA) 10 MG tablet TAKE 1 TABLET (10 MG TOTAL) BY MOUTH DAILY.   FREESTYLE LITE test strip 1 each 3 (three) times daily.   glimepiride (AMARYL) 2 MG tablet TAKE 1 TABLET (2 MG TOTAL) BY MOUTH 2 (TWO) TIMES DAILY WITH A MEAL.   Lancets (FREESTYLE) lancets 1 each 3 (three) times daily.   levothyroxine (SYNTHROID) 50 MCG tablet TAKE 1 TABLET BY MOUTH ONCE DAILY ON AN EMPTY STOMACH WITH A GLASS OF WATER AT LEAST 30-60 MINUTES BEFORE BREAKFAST.   lisinopril (ZESTRIL) 40 MG tablet TAKE 1 TABLET (40 MG TOTAL) BY MOUTH DAILY.   omeprazole (PRILOSEC OTC) 20 MG tablet Take 20 mg by mouth daily as needed (acid reflux/heartburn).   sildenafil (VIAGRA) 100 MG tablet Take 0.5-1 tablets (50-100 mg total) by mouth daily as needed for erectile dysfunction. (Patient taking differently: Take 100 mg by mouth daily as needed for erectile dysfunction.)   sitaGLIPtin (JANUVIA) 100 MG tablet TAKE 1 TABLET (100 MG TOTAL) BY MOUTH DAILY.   [DISCONTINUED] dapagliflozin propanediol (FARXIGA) 10 MG TABS tablet TAKE 1 TABLET (10 MG TOTAL) BY MOUTH EVERY MORNING   [DISCONTINUED] metFORMIN (GLUCOPHAGE) 850 MG tablet TAKE 1 TABLET BY MOUTH TWICE DAILY   [DISCONTINUED] metoprolol succinate (TOPROL-XL) 50 MG 24 hr tablet TAKE 1 TABLET BY MOUTH DAILY. TAKE WITH OR IMMEDIATELY FOLLOWING A MEAL.   [DISCONTINUED] valACYclovir (VALTREX) 1000 MG tablet take 1 to 2 tablets by mouth every 12 hours for 2 to 3 days as needed for fever blisters   Allergies  Allergen Reactions   Other     Rybelsus ab pain at 3 mg, 7 mg n/v   Pravachol [Pravastatin Sodium] Other (See Comments)    Muscle aches   Recent Results (from the past 2160 hour(s))  CUP PACEART REMOTE DEVICE CHECK      Status: None   Collection Time: 08/26/20 11:01 PM  Result Value Ref Range   Date Time Interrogation Session [id number]    Pulse Generator Manufacturer MERM    Pulse Gen Model LNQ22 LINQ II    Pulse Gen Serial Number [id number]    Clinic Name [facility] Pulse Generator Type ICM/ILR    Implantable Pulse Generator Implant Date [id number]  CUP PACEART REMOTE DEVICE CHECK     Status: None   Collection Time: 09/28/20 11:05 PM  Result Value Ref Range   Date Time Interrogation Session [id number]    Pulse Generator Manufacturer MERM    Pulse Gen Model LNQ22 LINQ II    Pulse Gen Serial Number [id number]    Clinic Name [facility]    Implantable Pulse Generator Type ICM/ILR    Implantable Pulse Generator Implant Date 20210503   CUP PACEART REMOTE DEVICE CHECK     Status: None   Collection Time: 10/31/20 11:04 PM  Result Value Ref Range   Date Time Interrogation Session [id number]    Pulse Generator Manufacturer MERM    Pulse Gen Model LNQ22 LINQ II    Pulse Gen Serial Number [id number]    Clinic Name [facility]    Implantable Pulse Generator Type ICM/ILR    Implantable Pulse Generator Implant Date 20210503    Eval Rhythm SR at 60 bpm    Objective  Body mass index is 32.55 kg/m. Wt Readings from Last 3 Encounters:  11/08/20 240 lb (108.9 kg)  05/10/20 239 lb 12.8 oz (108.8 kg)  01/04/20 242 lb (109.8 kg)   Temp Readings from Last 3 Encounters:  11/08/20 (!) 97.1 F (36.2 C) (Temporal)  05/10/20 98 F (36.7 C) (Oral)  11/06/19 98.5 F (36.9 C)   BP Readings from Last 3 Encounters:  11/08/20 122/80  05/10/20 124/82  01/04/20 111/67   Pulse Readings from Last 3 Encounters:  11/08/20 66  05/10/20 68  01/04/20 (!) 58    Physical Exam Vitals and nursing note reviewed.  Constitutional:      Appearance: Normal appearance. He is well-developed and well-groomed. He is obese.  HENT:     Head: Normocephalic and atraumatic.  Eyes:      Conjunctiva/sclera: Conjunctivae normal.     Pupils: Pupils are equal, round, and reactive to light.  Cardiovascular:     Rate and Rhythm: Normal rate and regular rhythm.     Heart sounds: Normal heart sounds. No murmur heard. Pulmonary:     Effort: Pulmonary effort is normal.     Breath sounds: Normal breath sounds.  Abdominal:     Tenderness: There is no abdominal tenderness.  Skin:    General: Skin is warm and dry.  Neurological:     General: No focal deficit present.     Mental Status: He is alert and oriented to person, place, and time. Mental status is at baseline.  Psychiatric:        Attention and Perception: Attention and perception normal.        Mood and Affect: Mood and affect normal.        Speech: Speech normal.        Behavior: Behavior normal. Behavior is cooperative.        Thought Content: Thought content normal.        Cognition and Memory: Cognition and memory normal.        Judgment: Judgment normal.    Assessment  Plan  Annual physical exam Flu shot at work 01/01/2020  tdap had 06/25/15 Disc shingrix today to consider in the future wants to wait until covid 19 vaccine comes out and he gts this Consider shingrix given Rx today 11/08/20  pna 23 had 04/05/14 due 1 year  Prevnar given today Pfizer 4/4 last 05/31/20      PSA, ED, low T with Dr. Daltstadht [facility] saw w/in the last few months   2020 get records  09/15/18 [facility] appt low T PSA 09/15/18 1.28, T was 1003.6  -on testosterone Q1-2 weeks per urology  -appt 12/30/19 seen Dr. Dahlstedt PSA 09/23/19 PSA 1.01 and testosterone 232 as of 12/30/19 stopped testosterone on clomid rec f/u endocrine F/u urology 10/2020    Colonoscopy 02/04/15 Dr. Butler diverticulosis f/u in 5 years [facility] due 02/2020 pt to call and schedule  -sent [facility] Dr. Butler 04/20/20 diverticulosis otherwise normal f/u in 10 year Dr. Robert Butler [facility] 08/22/10 +polyps  Colonoscopy due 2032    Eye exam 01/2020 consider f/u  before fall 2022    Hep C negative     Dermatology Dr. Kowalski due to f/u in 10 or 02/2019 pt will call for appt as of 02/24/2019  -seen [facility] in 2021 [facility] Dr. Kimmel 11/26/19 [place], [place], alopecia totalis rec wheat belly diet Skin cancer screening ln2 arms in 2022 and f/u 1 year    Never smoker Former etoh abuse in 30s but quit in 30s   rec healthy diet and exercise   Urology as of 2021 had prostate exam f/u Dr. Daltstadt   [facility]   Hypertension associated with diabetes (HCC) A1C 7.6 07/13/20 controlled- Plan: Comprehensive metabolic panel, Lipid panel, CBC with Differential/Platelet, Hemoglobin A1c  zetia 10, lipitor 80, farxiga 10 mg qd, amaryl 2 mg, lis 40 mg, metformin 850 mg qd, toprol 50 mg qd januvia 100 f/u endocrine [facility] 11/24/20 Dr. O'Connel   Hypothyroidism,  - Plan: TSH levo 50 mcg  B12 deficiency - Plan: Vitamin B12 Vitamin D deficiency - Plan: Vitamin D (25 hydroxy)  Anemia, unspecified type - Plan: Iron, Iron and TIBC([facility]), Ferritin   Provider: Dr. Tracy McLean-Scocuzza-Internal Medicine   

## 2020-11-08 NOTE — Patient Instructions (Signed)
Pneumococcal Conjugate Vaccine (Prevnar 13) Suspension for Injection What is this medication? PNEUMOCOCCAL VACCINE (NEU mo KOK al vak SEEN) is a vaccine used to prevent pneumococcus bacterial infections. These bacteria can cause serious infections like pneumonia, meningitis, and blood infections. This vaccine will lower your chance of getting pneumonia. If you do get pneumonia, it can make your symptoms milder and your illness shorter. This vaccine will not treat an infection and will not cause infection. This vaccine is recommended for infants and youngchildren, adults with certain medical conditions, and adults 73 years or older. This medicine may be used for other purposes; ask your health care provider orpharmacist if you have questions. COMMON BRAND NAME(S): Prevnar, Prevnar 13 What should I tell my care team before I take this medication? They need to know if you have any of these conditions: bleeding problems fever immune system problems an unusual or allergic reaction to pneumococcal vaccine, diphtheria toxoid, other vaccines, latex, other medicines, foods, dyes, or preservatives pregnant or trying to get pregnant breast-feeding How should I use this medication? This vaccine is for injection into a muscle. It is given by a health careprofessional. A copy of Vaccine Information Statements will be given before each vaccination.Read this sheet carefully each time. The sheet may change frequently. Talk to your pediatrician regarding the use of this medicine in children. While this drug may be prescribed for children as young as 14 weeks old for selectedconditions, precautions do apply. Overdosage: If you think you have taken too much of this medicine contact apoison control center or emergency room at once. NOTE: This medicine is only for you. Do not share this medicine with others. What if I miss a dose? It is important not to miss your dose. Call your doctor or health careprofessional if you  are unable to keep an appointment. What may interact with this medication? medicines for cancer chemotherapy medicines that suppress your immune function steroid medicines like prednisone or cortisone This list may not describe all possible interactions. Give your health care provider a list of all the medicines, herbs, non-prescription drugs, or dietary supplements you use. Also tell them if you smoke, drink alcohol, or use illegaldrugs. Some items may interact with your medicine. What should I watch for while using this medication? Mild fever and pain should go away in 3 days or less. Report any unusualsymptoms to your doctor or health care professional. What side effects may I notice from receiving this medication? Side effects that you should report to your doctor or health care professionalas soon as possible: allergic reactions like skin rash, itching or hives, swelling of the face, lips, or tongue breathing problems confused fast or irregular heartbeat fever over 102 degrees F seizures unusual bleeding or bruising unusual muscle weakness Side effects that usually do not require medical attention (report to yourdoctor or health care professional if they continue or are bothersome): aches and pains diarrhea fever of 102 degrees F or less headache irritable loss of appetite pain, tender at site where injected trouble sleeping This list may not describe all possible side effects. Call your doctor for medical advice about side effects. You may report side effects to FDA at1-800-FDA-1088. Where should I keep my medication? This does not apply. This vaccine is given in a clinic, pharmacy, doctor'soffice, or other health care setting and will not be stored at home. NOTE: This sheet is a summary. It may not cover all possible information. If you have questions about this medicine, talk to your doctor, pharmacist,  orhealth care provider.  2022 Elsevier/Gold Standard (2013-12-24  10:27:27)  Zoster Vaccine, Recombinant injection What is this medication? ZOSTER VACCINE (ZOS ter vak SEEN) is a vaccine used to reduce the risk of getting shingles. This vaccine is not used to treat shingles or nerve pain fromshingles. This medicine may be used for other purposes; ask your health care provider orpharmacist if you have questions. COMMON BRAND NAME(S): [facility] What should I tell my care team before I take this medication? They need to know if you have any of these conditions: cancer immune system problems an unusual or allergic reaction to Zoster vaccine, other medications, foods, dyes, or preservatives pregnant or trying to get pregnant breast-feeding How should I use this medication? This vaccine is injected into a muscle. It is given by a health care provider. A copy of Vaccine Information Statements will be given before each vaccination. Be sure to read this information carefully each time. This sheet may changeoften. Talk to your health care provider about the use of this vaccine in children.This vaccine is not approved for use in children. Overdosage: If you think you have taken too much of this medicine contact apoison control center or emergency room at once. NOTE: This medicine is only for you. Do not share this medicine with others. What if I miss a dose? Keep appointments for follow-up (booster) doses. It is important not to miss your dose. Call your health care provider if you are unable to keep anappointment. What may interact with this medication? medicines that suppress your immune system medicines to treat cancer steroid medicines like prednisone or cortisone This list may not describe all possible interactions. Give your health care provider a list of all the medicines, herbs, non-prescription drugs, or dietary supplements you use. Also tell them if you smoke, drink alcohol, or use illegaldrugs. Some items may interact with your medicine. What should I  watch for while using this medication? Visit your health care provider regularly. This vaccine, like all vaccines, may not fully protect everyone. What side effects may I notice from receiving this medication? Side effects that you should report to your doctor or health care professionalas soon as possible: allergic reactions (skin rash, itching or hives; swelling of the face, lips, or tongue) trouble breathing Side effects that usually do not require medical attention (report these toyour doctor or health care professional if they continue or are bothersome): chills headache fever nausea pain, redness, or irritation at site where injected tiredness vomiting This list may not describe all possible side effects. Call your doctor for medical advice about side effects. You may report side effects to FDA at1-800-FDA-1088. Where should I keep my medication? This vaccine is only given by a health care provider. It will not be stored athome. NOTE: This sheet is a summary. It may not cover all possible information. If you have questions about this medicine, talk to your doctor, pharmacist, orhealth care provider.  2022 Elsevier/Gold Standard (2019-04-24 16:23:07)

## 2020-11-11 ENCOUNTER — Other Ambulatory Visit: Payer: Self-pay

## 2020-11-15 ENCOUNTER — Other Ambulatory Visit: Payer: Self-pay

## 2020-11-16 ENCOUNTER — Other Ambulatory Visit: Payer: Self-pay | Admitting: Family

## 2020-11-16 ENCOUNTER — Other Ambulatory Visit
Admission: RE | Admit: 2020-11-16 | Discharge: 2020-11-16 | Disposition: A | Discharge status: Home / Self Care | Payer: 59 | Source: Ambulatory Visit | Attending: Internal Medicine | Admitting: Internal Medicine

## 2020-11-16 DIAGNOSIS — I152 Hypertension secondary to endocrine disorders: Secondary | ICD-10-CM | POA: Diagnosis not present

## 2020-11-16 DIAGNOSIS — D649 Anemia, unspecified: Secondary | ICD-10-CM | POA: Diagnosis not present

## 2020-11-16 DIAGNOSIS — E538 Deficiency of other specified B group vitamins: Secondary | ICD-10-CM | POA: Insufficient documentation

## 2020-11-16 DIAGNOSIS — E1159 Type 2 diabetes mellitus with other circulatory complications: Secondary | ICD-10-CM | POA: Diagnosis not present

## 2020-11-16 DIAGNOSIS — E559 Vitamin D deficiency, unspecified: Secondary | ICD-10-CM | POA: Insufficient documentation

## 2020-11-16 DIAGNOSIS — E039 Hypothyroidism, unspecified: Secondary | ICD-10-CM | POA: Insufficient documentation

## 2020-11-16 LAB — CBC WITH DIFFERENTIAL/PLATELET
Abs Immature Granulocytes: 0.04 10*3/uL (ref 0.00–0.07)
Basophils Absolute: 0 10*3/uL (ref 0.0–0.1)
Basophils Relative: 0 %
Eosinophils Absolute: 0.2 10*3/uL (ref 0.0–0.5)
Eosinophils Relative: 2 %
HCT: 44.9 % (ref 39.0–52.0)
Hemoglobin: 15.4 g/dL (ref 13.0–17.0)
Immature Granulocytes: 0 %
Lymphocytes Relative: 25 %
Lymphs Abs: 2.3 10*3/uL (ref 0.7–4.0)
MCH: 33 pg (ref 26.0–34.0)
MCHC: 34.3 g/dL (ref 30.0–36.0)
MCV: 96.1 fL (ref 80.0–100.0)
Monocytes Absolute: 0.7 10*3/uL (ref 0.1–1.0)
Monocytes Relative: 8 %
Neutro Abs: 5.8 10*3/uL (ref 1.7–7.7)
Neutrophils Relative %: 65 %
Platelets: 204 10*3/uL (ref 150–400)
RBC: 4.67 MIL/uL (ref 4.22–5.81)
RDW: 12.4 % (ref 11.5–15.5)
WBC: 9.1 10*3/uL (ref 4.0–10.5)
nRBC: 0 % (ref 0.0–0.2)

## 2020-11-16 LAB — IRON AND TIBC
Iron: 68 ug/dL (ref 45–182)
Saturation Ratios: 21 % (ref 17.9–39.5)
TIBC: 328 ug/dL (ref 250–450)
UIBC: 260 ug/dL

## 2020-11-16 LAB — COMPREHENSIVE METABOLIC PANEL
ALT: 25 U/L (ref 0–44)
AST: 26 U/L (ref 15–41)
Albumin: 4.3 g/dL (ref 3.5–5.0)
Alkaline Phosphatase: 59 U/L (ref 38–126)
Anion gap: 11 (ref 5–15)
BUN: 20 mg/dL (ref 6–20)
CO2: 21 mmol/L — ABNORMAL LOW (ref 22–32)
Calcium: 9.5 mg/dL (ref 8.9–10.3)
Chloride: 105 mmol/L (ref 98–111)
Creatinine, Ser: 1.35 mg/dL — ABNORMAL HIGH (ref 0.61–1.24)
GFR, Estimated: >60 mL/min (ref >60)
Glucose, Bld: 144 mg/dL — ABNORMAL HIGH (ref 70–99)
Potassium: 4.7 mmol/L (ref 3.5–5.1)
Sodium: 137 mmol/L (ref 135–145)
Total Bilirubin: 0.6 mg/dL (ref 0.3–1.2)
Total Protein: 6.8 g/dL (ref 6.5–8.1)

## 2020-11-16 LAB — VITAMIN D 25 HYDROXY (VIT D DEFICIENCY, FRACTURES): Vit D, 25-Hydroxy: 37.53 ng/mL (ref 30–100)

## 2020-11-16 LAB — LIPID PANEL
Cholesterol: 93 mg/dL (ref 0–200)
HDL: 23 mg/dL — ABNORMAL LOW (ref >40)
LDL Cholesterol: 41 mg/dL (ref 0–99)
Total CHOL/HDL Ratio: 4 RATIO
Triglycerides: 145 mg/dL (ref <150)
VLDL: 29 mg/dL (ref 0–40)

## 2020-11-16 LAB — FERRITIN: Ferritin: 155 ng/mL (ref 24–336)

## 2020-11-16 LAB — TSH: TSH: 4.997 u[IU]/mL — ABNORMAL HIGH (ref 0.350–4.500)

## 2020-11-16 LAB — VITAMIN B12: Vitamin B-12: 208 pg/mL (ref 180–914)

## 2020-11-16 MED ORDER — LEVOTHYROXINE SODIUM 75 MCG PO TABS
75.0000 ug | ORAL_TABLET | Freq: Every day | ORAL | 3 refills | Status: DC
  Filled 2020-11-16: qty 90, 90d supply, fill #0

## 2020-11-17 ENCOUNTER — Other Ambulatory Visit: Payer: Self-pay

## 2020-11-17 LAB — HEMOGLOBIN A1C
Hgb A1c MFr Bld: 6.9 % — ABNORMAL HIGH (ref 4.8–5.6)
Mean Plasma Glucose: 151.33 mg/dL

## 2020-11-21 ENCOUNTER — Other Ambulatory Visit: Payer: Self-pay

## 2020-11-21 MED FILL — Clomiphene Citrate Tab 50 MG: ORAL | 63 days supply | Qty: 27 | Fill #1 | Status: AC

## 2020-11-24 DIAGNOSIS — E119 Type 2 diabetes mellitus without complications: Secondary | ICD-10-CM | POA: Diagnosis not present

## 2020-11-24 DIAGNOSIS — E1169 Type 2 diabetes mellitus with other specified complication: Secondary | ICD-10-CM | POA: Diagnosis not present

## 2020-11-24 DIAGNOSIS — I152 Hypertension secondary to endocrine disorders: Secondary | ICD-10-CM | POA: Diagnosis not present

## 2020-11-24 DIAGNOSIS — E1159 Type 2 diabetes mellitus with other circulatory complications: Secondary | ICD-10-CM | POA: Diagnosis not present

## 2020-11-29 NOTE — Progress Notes (Signed)
Carelink Summary Report / Loop Recorder 

## 2020-12-12 ENCOUNTER — Ambulatory Visit (INDEPENDENT_AMBULATORY_CARE_PROVIDER_SITE_OTHER): Payer: 59

## 2020-12-12 DIAGNOSIS — I6340 Cerebral infarction due to embolism of unspecified cerebral artery: Secondary | ICD-10-CM | POA: Diagnosis not present

## 2020-12-12 DIAGNOSIS — E291 Testicular hypofunction: Secondary | ICD-10-CM | POA: Diagnosis not present

## 2020-12-14 LAB — CUP PACEART REMOTE DEVICE CHECK
Date Time Interrogation Session: 20220903230626
Implantable Pulse Generator Implant Date: 20210503

## 2020-12-16 NOTE — Progress Notes (Signed)
Carelink Summary Report / Loop Recorder 

## 2020-12-17 ENCOUNTER — Other Ambulatory Visit: Payer: Self-pay

## 2020-12-17 ENCOUNTER — Inpatient Hospital Stay (HOSPITAL_COMMUNITY): Payer: 59 | Admitting: Certified Registered"

## 2020-12-17 ENCOUNTER — Inpatient Hospital Stay (HOSPITAL_COMMUNITY)
Admission: EM | Admit: 2020-12-17 | Discharge: 2020-12-20 | DRG: 023 | Disposition: A | Discharge status: Home / Self Care | Payer: 59 | Source: Ambulatory Visit | Attending: Neurology | Admitting: Neurology

## 2020-12-17 ENCOUNTER — Inpatient Hospital Stay (HOSPITAL_COMMUNITY): Payer: 59

## 2020-12-17 ENCOUNTER — Emergency Department
Admission: EM | Admit: 2020-12-17 | Discharge: 2020-12-17 | Disposition: A | Discharge status: Other Acute Inpatient Hospital | Payer: 59 | Attending: Emergency Medicine | Admitting: Emergency Medicine

## 2020-12-17 ENCOUNTER — Emergency Department: Payer: 59

## 2020-12-17 ENCOUNTER — Encounter (HOSPITAL_COMMUNITY)
Admission: EM | Disposition: A | Discharge status: Home / Self Care | Payer: Self-pay | Source: Ambulatory Visit | Attending: Neurology

## 2020-12-17 ENCOUNTER — Encounter (HOSPITAL_COMMUNITY): Payer: Self-pay | Admitting: Neurology

## 2020-12-17 DIAGNOSIS — I2510 Atherosclerotic heart disease of native coronary artery without angina pectoris: Secondary | ICD-10-CM | POA: Diagnosis not present

## 2020-12-17 DIAGNOSIS — K219 Gastro-esophageal reflux disease without esophagitis: Secondary | ICD-10-CM | POA: Diagnosis present

## 2020-12-17 DIAGNOSIS — Z8673 Personal history of transient ischemic attack (TIA), and cerebral infarction without residual deficits: Secondary | ICD-10-CM | POA: Diagnosis not present

## 2020-12-17 DIAGNOSIS — Z8249 Family history of ischemic heart disease and other diseases of the circulatory system: Secondary | ICD-10-CM | POA: Diagnosis not present

## 2020-12-17 DIAGNOSIS — N1831 Chronic kidney disease, stage 3a: Secondary | ICD-10-CM | POA: Diagnosis present

## 2020-12-17 DIAGNOSIS — K760 Fatty (change of) liver, not elsewhere classified: Secondary | ICD-10-CM | POA: Diagnosis present

## 2020-12-17 DIAGNOSIS — E7800 Pure hypercholesterolemia, unspecified: Secondary | ICD-10-CM | POA: Diagnosis not present

## 2020-12-17 DIAGNOSIS — Z7902 Long term (current) use of antithrombotics/antiplatelets: Secondary | ICD-10-CM

## 2020-12-17 DIAGNOSIS — R4701 Aphasia: Secondary | ICD-10-CM | POA: Diagnosis present

## 2020-12-17 DIAGNOSIS — Z7984 Long term (current) use of oral hypoglycemic drugs: Secondary | ICD-10-CM | POA: Diagnosis not present

## 2020-12-17 DIAGNOSIS — G8194 Hemiplegia, unspecified affecting left nondominant side: Secondary | ICD-10-CM | POA: Diagnosis present

## 2020-12-17 DIAGNOSIS — I6523 Occlusion and stenosis of bilateral carotid arteries: Secondary | ICD-10-CM | POA: Diagnosis not present

## 2020-12-17 DIAGNOSIS — R29720 NIHSS score 20: Secondary | ICD-10-CM | POA: Diagnosis present

## 2020-12-17 DIAGNOSIS — R404 Transient alteration of awareness: Secondary | ICD-10-CM | POA: Diagnosis not present

## 2020-12-17 DIAGNOSIS — E039 Hypothyroidism, unspecified: Secondary | ICD-10-CM | POA: Insufficient documentation

## 2020-12-17 DIAGNOSIS — Z79899 Other long term (current) drug therapy: Secondary | ICD-10-CM | POA: Diagnosis not present

## 2020-12-17 DIAGNOSIS — R0902 Hypoxemia: Secondary | ICD-10-CM | POA: Diagnosis not present

## 2020-12-17 DIAGNOSIS — I6389 Other cerebral infarction: Secondary | ICD-10-CM | POA: Diagnosis not present

## 2020-12-17 DIAGNOSIS — I252 Old myocardial infarction: Secondary | ICD-10-CM

## 2020-12-17 DIAGNOSIS — I639 Cerebral infarction, unspecified: Secondary | ICD-10-CM | POA: Diagnosis not present

## 2020-12-17 DIAGNOSIS — Z20822 Contact with and (suspected) exposure to covid-19: Secondary | ICD-10-CM | POA: Diagnosis present

## 2020-12-17 DIAGNOSIS — E1122 Type 2 diabetes mellitus with diabetic chronic kidney disease: Secondary | ICD-10-CM | POA: Diagnosis not present

## 2020-12-17 DIAGNOSIS — I129 Hypertensive chronic kidney disease with stage 1 through stage 4 chronic kidney disease, or unspecified chronic kidney disease: Secondary | ICD-10-CM | POA: Diagnosis present

## 2020-12-17 DIAGNOSIS — E669 Obesity, unspecified: Secondary | ICD-10-CM | POA: Diagnosis not present

## 2020-12-17 DIAGNOSIS — I63511 Cerebral infarction due to unspecified occlusion or stenosis of right middle cerebral artery: Secondary | ICD-10-CM | POA: Insufficient documentation

## 2020-12-17 DIAGNOSIS — Z9889 Other specified postprocedural states: Secondary | ICD-10-CM | POA: Diagnosis not present

## 2020-12-17 DIAGNOSIS — Z7982 Long term (current) use of aspirin: Secondary | ICD-10-CM | POA: Insufficient documentation

## 2020-12-17 DIAGNOSIS — I10 Essential (primary) hypertension: Secondary | ICD-10-CM | POA: Diagnosis not present

## 2020-12-17 DIAGNOSIS — Z955 Presence of coronary angioplasty implant and graft: Secondary | ICD-10-CM | POA: Diagnosis not present

## 2020-12-17 DIAGNOSIS — E1165 Type 2 diabetes mellitus with hyperglycemia: Secondary | ICD-10-CM

## 2020-12-17 DIAGNOSIS — E785 Hyperlipidemia, unspecified: Secondary | ICD-10-CM | POA: Diagnosis present

## 2020-12-17 DIAGNOSIS — Z6833 Body mass index (BMI) 33.0-33.9, adult: Secondary | ICD-10-CM | POA: Diagnosis not present

## 2020-12-17 DIAGNOSIS — Z79890 Hormone replacement therapy (postmenopausal): Secondary | ICD-10-CM

## 2020-12-17 DIAGNOSIS — I6319 Cerebral infarction due to embolism of other precerebral artery: Secondary | ICD-10-CM

## 2020-12-17 DIAGNOSIS — I609 Nontraumatic subarachnoid hemorrhage, unspecified: Secondary | ICD-10-CM | POA: Diagnosis present

## 2020-12-17 DIAGNOSIS — I513 Intracardiac thrombosis, not elsewhere classified: Secondary | ICD-10-CM | POA: Diagnosis not present

## 2020-12-17 DIAGNOSIS — G459 Transient cerebral ischemic attack, unspecified: Secondary | ICD-10-CM | POA: Diagnosis not present

## 2020-12-17 DIAGNOSIS — I63411 Cerebral infarction due to embolism of right middle cerebral artery: Principal | ICD-10-CM | POA: Diagnosis present

## 2020-12-17 HISTORY — PX: RADIOLOGY WITH ANESTHESIA: SHX6223

## 2020-12-17 HISTORY — PX: IR ANGIO INTRA EXTRACRAN SEL COM CAROTID INNOMINATE UNI L MOD SED: IMG5358

## 2020-12-17 HISTORY — PX: IR CT HEAD LTD: IMG2386

## 2020-12-17 HISTORY — PX: IR PERCUTANEOUS ART THROMBECTOMY/INFUSION INTRACRANIAL INC DIAG ANGIO: IMG6087

## 2020-12-17 HISTORY — PX: IR US GUIDE VASC ACCESS RIGHT: IMG2390

## 2020-12-17 LAB — CBC
HCT: 42.6 % (ref 39.0–52.0)
Hemoglobin: 14.6 g/dL (ref 13.0–17.0)
MCH: 32.2 pg (ref 26.0–34.0)
MCHC: 34.3 g/dL (ref 30.0–36.0)
MCV: 93.8 fL (ref 80.0–100.0)
Platelets: 185 10*3/uL (ref 150–400)
RBC: 4.54 MIL/uL (ref 4.22–5.81)
RDW: 11.8 % (ref 11.5–15.5)
WBC: 10.7 10*3/uL — ABNORMAL HIGH (ref 4.0–10.5)
nRBC: 0 % (ref 0.0–0.2)

## 2020-12-17 LAB — RESP PANEL BY RT-PCR (FLU A&B, COVID) ARPGX2
Influenza A by PCR: NEGATIVE
Influenza B by PCR: NEGATIVE
SARS Coronavirus 2 by RT PCR: NEGATIVE

## 2020-12-17 LAB — DIFFERENTIAL
Abs Immature Granulocytes: 0.04 10*3/uL (ref 0.00–0.07)
Basophils Absolute: 0.1 10*3/uL (ref 0.0–0.1)
Basophils Relative: 1 %
Eosinophils Absolute: 0.2 10*3/uL (ref 0.0–0.5)
Eosinophils Relative: 2 %
Immature Granulocytes: 0 %
Lymphocytes Relative: 27 %
Lymphs Abs: 2.9 10*3/uL (ref 0.7–4.0)
Monocytes Absolute: 0.9 10*3/uL (ref 0.1–1.0)
Monocytes Relative: 9 %
Neutro Abs: 6.6 10*3/uL (ref 1.7–7.7)
Neutrophils Relative %: 61 %

## 2020-12-17 LAB — COMPREHENSIVE METABOLIC PANEL
ALT: 24 U/L (ref 0–44)
AST: 25 U/L (ref 15–41)
Albumin: 3.9 g/dL (ref 3.5–5.0)
Alkaline Phosphatase: 50 U/L (ref 38–126)
Anion gap: 7 (ref 5–15)
BUN: 19 mg/dL (ref 8–23)
CO2: 24 mmol/L (ref 22–32)
Calcium: 9.3 mg/dL (ref 8.9–10.3)
Chloride: 107 mmol/L (ref 98–111)
Creatinine, Ser: 1.34 mg/dL — ABNORMAL HIGH (ref 0.61–1.24)
GFR, Estimated: >60 mL/min (ref >60)
Glucose, Bld: 188 mg/dL — ABNORMAL HIGH (ref 70–99)
Potassium: 3.9 mmol/L (ref 3.5–5.1)
Sodium: 138 mmol/L (ref 135–145)
Total Bilirubin: 0.5 mg/dL (ref 0.3–1.2)
Total Protein: 6.4 g/dL — ABNORMAL LOW (ref 6.5–8.1)

## 2020-12-17 LAB — PROTIME-INR
INR: 0.9 (ref 0.8–1.2)
Prothrombin Time: 12.6 seconds (ref 11.4–15.2)

## 2020-12-17 LAB — ECHOCARDIOGRAM COMPLETE
Area-P 1/2: 3.08 cm2
Height: 72 in
S' Lateral: 3.5 cm
Weight: 3922.42 oz

## 2020-12-17 LAB — GLUCOSE, CAPILLARY
Glucose-Capillary: 105 mg/dL — ABNORMAL HIGH (ref 70–99)
Glucose-Capillary: 159 mg/dL — ABNORMAL HIGH (ref 70–99)
Glucose-Capillary: 175 mg/dL — ABNORMAL HIGH (ref 70–99)
Glucose-Capillary: 191 mg/dL — ABNORMAL HIGH (ref 70–99)

## 2020-12-17 LAB — APTT: aPTT: 26 seconds (ref 24–36)

## 2020-12-17 LAB — CBG MONITORING, ED: Glucose-Capillary: 167 mg/dL — ABNORMAL HIGH (ref 70–99)

## 2020-12-17 SURGERY — IR WITH ANESTHESIA
Anesthesia: General

## 2020-12-17 MED ORDER — EZETIMIBE 10 MG PO TABS
10.0000 mg | ORAL_TABLET | Freq: Every day | ORAL | Status: DC
  Administered 2020-12-17 – 2020-12-20 (×4): 10 mg via ORAL
  Filled 2020-12-17 (×4): qty 1

## 2020-12-17 MED ORDER — ATORVASTATIN CALCIUM 80 MG PO TABS
80.0000 mg | ORAL_TABLET | Freq: Every day | ORAL | Status: DC
  Administered 2020-12-17 – 2020-12-20 (×4): 80 mg via ORAL
  Filled 2020-12-17 (×4): qty 1

## 2020-12-17 MED ORDER — SODIUM CHLORIDE 0.9 % IV SOLN: INTRAVENOUS | Status: DC

## 2020-12-17 MED ORDER — HEPARIN (PORCINE) 25000 UT/250ML-% IV SOLN
1000.0000 [IU]/h | INTRAVENOUS | Status: AC
  Administered 2020-12-17 – 2020-12-18 (×2): 1400 [IU]/h via INTRAVENOUS
  Filled 2020-12-17 (×3): qty 250

## 2020-12-17 MED ORDER — LIDOCAINE 2% (20 MG/ML) 5 ML SYRINGE
INTRAMUSCULAR | Status: DC | PRN
  Administered 2020-12-17: 80 mg via INTRAVENOUS

## 2020-12-17 MED ORDER — DEXAMETHASONE SODIUM PHOSPHATE 10 MG/ML IJ SOLN
INTRAMUSCULAR | Status: DC | PRN
  Administered 2020-12-17: 4 mg via INTRAVENOUS

## 2020-12-17 MED ORDER — INSULIN ASPART 100 UNIT/ML IJ SOLN
0.0000 [IU] | INTRAMUSCULAR | Status: DC
  Administered 2020-12-17 (×3): 3 [IU] via SUBCUTANEOUS

## 2020-12-17 MED ORDER — IOHEXOL 240 MG/ML SOLN: 50.0000 mL | Freq: Once | INTRAMUSCULAR | Status: DC | PRN

## 2020-12-17 MED ORDER — PHENYLEPHRINE 40 MCG/ML (10ML) SYRINGE FOR IV PUSH (FOR BLOOD PRESSURE SUPPORT)
PREFILLED_SYRINGE | INTRAVENOUS | Status: DC | PRN
  Administered 2020-12-17 (×3): 80 ug via INTRAVENOUS

## 2020-12-17 MED ORDER — ONDANSETRON HCL 4 MG/2ML IJ SOLN
INTRAMUSCULAR | Status: DC | PRN
  Administered 2020-12-17: 4 mg via INTRAVENOUS

## 2020-12-17 MED ORDER — ACETAMINOPHEN 650 MG RE SUPP: 650.0000 mg | RECTAL | Status: DC | PRN

## 2020-12-17 MED ORDER — ACETAMINOPHEN 325 MG PO TABS: 650.0000 mg | ORAL_TABLET | ORAL | Status: DC | PRN

## 2020-12-17 MED ORDER — PROPOFOL 10 MG/ML IV BOLUS
INTRAVENOUS | Status: DC | PRN
Start: 2020-12-17 — End: 2020-12-17
  Administered 2020-12-17: 130 mg via INTRAVENOUS
  Administered 2020-12-17: 50 mg via INTRAVENOUS

## 2020-12-17 MED ORDER — STROKE: EARLY STAGES OF RECOVERY BOOK
Freq: Once | Status: AC
  Filled 2020-12-17: qty 1

## 2020-12-17 MED ORDER — SODIUM CHLORIDE 0.9% FLUSH: 3.0000 mL | Freq: Once | INTRAVENOUS | Status: DC

## 2020-12-17 MED ORDER — IOHEXOL 350 MG/ML SOLN
75.0000 mL | Freq: Once | INTRAVENOUS | Status: AC | PRN
  Administered 2020-12-17: 75 mL via INTRAVENOUS

## 2020-12-17 MED ORDER — OMEPRAZOLE MAGNESIUM 20 MG PO TBEC: 20.0000 mg | DELAYED_RELEASE_TABLET | Freq: Every day | ORAL | Status: DC

## 2020-12-17 MED ORDER — CLEVIDIPINE BUTYRATE 0.5 MG/ML IV EMUL
0.0000 mg/h | INTRAVENOUS | Status: DC
  Administered 2020-12-17: 2 mg/h via INTRAVENOUS
  Administered 2020-12-17: 6 mg/h via INTRAVENOUS
  Filled 2020-12-17: qty 50

## 2020-12-17 MED ORDER — OMEPRAZOLE MAGNESIUM 20 MG PO TBEC: 20.0000 mg | DELAYED_RELEASE_TABLET | Freq: Every day | ORAL | Status: DC | PRN

## 2020-12-17 MED ORDER — CLEVIDIPINE BUTYRATE 0.5 MG/ML IV EMUL
INTRAVENOUS | Status: AC
  Filled 2020-12-17: qty 50

## 2020-12-17 MED ORDER — GLYCOPYRROLATE 0.2 MG/ML IJ SOLN
INTRAMUSCULAR | Status: DC | PRN
  Administered 2020-12-17: .2 mg via INTRAVENOUS

## 2020-12-17 MED ORDER — PERFLUTREN LIPID MICROSPHERE
1.0000 mL | INTRAVENOUS | Status: AC | PRN
Start: 2020-12-17 — End: 2020-12-17
  Administered 2020-12-17: 3 mL via INTRAVENOUS
  Filled 2020-12-17: qty 10

## 2020-12-17 MED ORDER — SUCCINYLCHOLINE CHLORIDE 200 MG/10ML IV SOSY
PREFILLED_SYRINGE | INTRAVENOUS | Status: DC | PRN
  Administered 2020-12-17: 120 mg via INTRAVENOUS

## 2020-12-17 MED ORDER — IOHEXOL 240 MG/ML SOLN
50.0000 mL | Freq: Once | INTRAMUSCULAR | Status: AC | PRN
  Administered 2020-12-17: 15 mL via INTRAVENOUS

## 2020-12-17 MED ORDER — METOPROLOL SUCCINATE ER 50 MG PO TB24
50.0000 mg | ORAL_TABLET | Freq: Every day | ORAL | Status: DC
  Administered 2020-12-17 – 2020-12-18 (×2): 50 mg via ORAL
  Filled 2020-12-17 (×2): qty 1

## 2020-12-17 MED ORDER — ACETAMINOPHEN 160 MG/5ML PO SOLN: 650.0000 mg | ORAL | Status: DC | PRN

## 2020-12-17 MED ORDER — SUGAMMADEX SODIUM 200 MG/2ML IV SOLN
INTRAVENOUS | Status: DC | PRN
  Administered 2020-12-17: 200 mg via INTRAVENOUS

## 2020-12-17 MED ORDER — DAPAGLIFLOZIN PROPANEDIOL 10 MG PO TABS
10.0000 mg | ORAL_TABLET | Freq: Every day | ORAL | Status: DC
  Administered 2020-12-17 – 2020-12-20 (×4): 10 mg via ORAL
  Filled 2020-12-17 (×4): qty 1

## 2020-12-17 MED ORDER — ROCURONIUM BROMIDE 10 MG/ML (PF) SYRINGE
PREFILLED_SYRINGE | INTRAVENOUS | Status: DC | PRN
  Administered 2020-12-17: 50 mg via INTRAVENOUS
  Administered 2020-12-17: 5 mg via INTRAVENOUS

## 2020-12-17 MED ORDER — LACTATED RINGERS IV SOLN: INTRAVENOUS | Status: DC | PRN

## 2020-12-17 MED ORDER — PHENYLEPHRINE HCL-NACL 20-0.9 MG/250ML-% IV SOLN
INTRAVENOUS | Status: DC | PRN
  Administered 2020-12-17: 25 ug/min via INTRAVENOUS

## 2020-12-17 MED ORDER — LEVOTHYROXINE SODIUM 75 MCG PO TABS
75.0000 ug | ORAL_TABLET | Freq: Every day | ORAL | Status: DC
  Administered 2020-12-17 – 2020-12-20 (×5): 75 ug via ORAL
  Filled 2020-12-17 (×4): qty 1

## 2020-12-17 NOTE — ED Notes (Signed)
Patient transported to CT 

## 2020-12-17 NOTE — ED Notes (Signed)
Verbal report given to Marliss Coots CRNA at this time

## 2020-12-17 NOTE — Anesthesia Preprocedure Evaluation (Signed)
Anesthesia Evaluation  Patient identified by MRN, date of birth, ID band Patient confused    Reviewed: Allergy & Precautions, NPO status , Patient's Chart, lab work & pertinent test results, reviewed documented beta blocker date and time Preop documentation limited or incomplete due to emergent nature of procedure.  Airway Mallampati: II  TM Distance: >3 FB Neck ROM: Full    Dental  (+) Teeth Intact, Dental Advisory Given   Pulmonary neg pulmonary ROS,    Pulmonary exam normal breath sounds clear to auscultation       Cardiovascular hypertension, Pt. on medications and Pt. on home beta blockers + CAD and + Past MI  Normal cardiovascular exam Rhythm:Regular Rate:Normal     Neuro/Psych CVA    GI/Hepatic Neg liver ROS, GERD  Medicated,  Endo/Other  diabetes, Type 2, Oral Hypoglycemic AgentsHypothyroidism Obesity   Renal/GU Renal InsufficiencyRenal disease     Musculoskeletal  (+) Arthritis ,   Abdominal   Peds  Hematology  (+) Blood dyscrasia (Plavix), ,   Anesthesia Other Findings Day of surgery medications reviewed with the patient.  Reproductive/Obstetrics                            Anesthesia Physical Anesthesia Plan  ASA: 3 and emergent  Anesthesia Plan: General   Post-op Pain Management:    Induction: Intravenous  PONV Risk Score and Plan: 2 and Dexamethasone and Ondansetron  Airway Management Planned: Oral ETT  Additional Equipment: Arterial line  Intra-op Plan:   Post-operative Plan: Possible Post-op intubation/ventilation  Informed Consent: I have reviewed the patients History and Physical, chart, labs and discussed the procedure including the risks, benefits and alternatives for the proposed anesthesia with the patient or authorized representative who has indicated his/her understanding and acceptance.     Dental advisory given, Only emergency history available and  History available from chart only  Plan Discussed with: CRNA  Anesthesia Plan Comments:         Anesthesia Quick Evaluation

## 2020-12-17 NOTE — ED Notes (Signed)
[facility] to transport to [facility]

## 2020-12-17 NOTE — Consult Note (Signed)
[facility] Tele[facility]rology Consult Services   Date of Service:   12/17/2020 05:21:01  Diagnosis:       I63.9 - Cerebrovascular accident (CVA), unspecified mechanism ([place])  Impression:      stroke  Is not a thrombolytic candidate but per report has a right M1 occlusion and will be transferred for potential endovascular treatment  Metrics: Last Known Well: 12/16/2020 22:00:00 [facility] Notification Time: 12/17/2020 05:21:01 Arrival Time: 12/17/2020 04:45:00 Stamp Time: 12/17/2020 05:21:01 Initial Response Time: 12/17/2020 05:24:40 Symptoms: weakness. NIHSS Start Assessment Time: 12/17/2020 05:30:38 Patient is not a candidate for Thrombolytic. Thrombolytic Medical Decision: 12/17/2020 05:31:25 Patient was not deemed candidate for Thrombolytic because of following reasons: Last Well Known Above 4.5 Hours.  CT head was reviewed and results were: CLINICAL DATA: Code stroke. Left-sided weakness   EXAM: CT HEAD WITHOUT CONTRAST   TECHNIQUE: Contiguous axial images were obtained from the base of the skull through the vertex without intravenous contrast.   COMPARISON: 08/02/2019   FINDINGS: Brain: Small chronic high right frontal cortex infarct. No convincing acute infarct. No acute hemorrhage. Remote bilateral cerebellar infarcts. No hydrocephalus or mass.   Vascular: Hyperdense right M1 segment   Skull: Negative   Sinuses/Orbits: Negative   Other: Critical Value/emergent results were called by telephone at the time of interpretation on 12/17/2020 at 5:24 am to provider [facility] , who verbally acknowledged these results.   ASPECTS [facility] Stroke Program Early CT Score)   - Ganglionic level infarction (caudate, lentiform nuclei, internal capsule, insula, M1-M3 cortex): 7   - Supraganglionic infarction (M4-M6 cortex): 3 when accounting for chronic cortex infarct   Total score (0-10 with 10 being normal): 10   IMPRESSION: 1. Hyperdense right M1 segment,  reference CTA. 2. No acute hemorrhage or visible acute infarct. 3. Remote right frontal cortex and bilateral cerebellar infarcts.     Electronically Signed By: Jorje Guild M.D. On: 12/17/2020 05:25  ED Physician notified of diagnostic impression and management plan on 12/17/2020 05:56:54  Advanced Imaging: CTA Head and Neck Completed.  LVO:Yes  Discussed with NIR:No  Discussed with NIR Text:  ED physician spoke to receiving hospital with nir    Sign Out:       Discussed with Emergency Department Provider    ------------------------------------------------------------------------------  History of Present Illness: Patient is a 60 year old Male.  Patient was brought by EMS for symptoms of weakness.  this is a 60 year old male who is unable to offer any history because of his current condition. Per report his last known well was when he went to bed last night and then he was found on the floor this morning and with weakness. CTA shows a reported right M1 occlusion and plans are being made to transfer him to a facility with neuro IR   Anticoagulant use:  Unknown  Antiplatelet use: Unknown  Allergies:  Reviewed    Examination: BP(147//86), Pulse(85), Blood Glucose(167) 1A: Level of Consciousness - Alert; keenly responsive + 0 1B: Ask Month and Age - Both Questions Right + 0 1C: Blink Eyes & Squeeze Hands - Performs Both Tasks + 0 2: Test Horizontal Extraocular Movements - Normal + 0 3: Test Visual Fields - No Visual Loss + 0 4: Test Facial Palsy (Use Grimace if Obtunded) - Minor paralysis (flat nasolabial fold, smile asymmetry) + 1 5A: Test Left Arm Motor Drift - Drift, but doesn't hit bed + 1 5B: Test Right Arm Motor Drift - No Drift for 10 Seconds + 0 6A: Test Left Leg Motor  Drift - Drift, but doesn't hit bed + 1 6B: Test Right Leg Motor Drift - No Drift for 5 Seconds + 0 7: Test Limb Ataxia (FNF/Heel-Shin) - No Ataxia + 0 8: Test Sensation - Normal; No  sensory loss + 0 9: Test Language/Aphasia - Mute/Global Aphasia: No Usable Speech/Auditory Comprehension + 3 10: Test Dysarthria - Mute/Anarthric + 2 11: Test Extinction/Inattention - Visual/tactile/auditory/spatial/personal inattention + 1  NIHSS Score: 9   Pre-Morbid Modified Rankin Scale: 0 Points = No symptoms at all   Patient/Family was informed the Neurology Consult would occur via TeleHealth consult by way of interactive audio and video telecommunications and consented to receiving care in this manner.   Patient is being evaluated for possible acute neurologic impairment and high probability of imminent or life-threatening deterioration. I spent total of 20 minutes providing care to this patient, including time for face to face visit via telemedicine, review of medical records, imaging studies and discussion of findings with providers, the patient and/or family.   Dr Janean Sark   [facility] [phone]  Case AM:[id number]

## 2020-12-17 NOTE — Progress Notes (Signed)
STROKE TEAM PROGRESS NOTE   SUBJECTIVE (INTERVAL HISTORY) His wife and RN are at the bedside.  Overall his condition is gradually improving. Now able to say short sentences intermittently. Moving all extremities. Pending MRI and MRA.    OBJECTIVE Temp:  [97.6 F (36.4 C)-98.1 F (36.7 C)] 98.1 F (36.7 C) (09/17 1954) Pulse Rate:  [55-93] 66 (09/17 1900) Resp:  [11-26] 16 (09/17 1900) BP: (74-147)/(31-108) 113/72 (09/17 1900) SpO2:  [91 %-100 %] 95 % (09/17 1900) Arterial Line BP: (108-153)/(66-90) 130/74 (09/17 1230) Weight:  [111.2 kg] 111.2 kg (09/17 0523)  Recent Labs  Lab 12/17/20 0451 12/17/20 1159 12/17/20 1534 12/17/20 1953  GLUCAP 167* 175* 191* 159*   Recent Labs  Lab 12/17/20 0506  NA 138  K 3.9  CL 107  CO2 24  GLUCOSE 188*  BUN 19  CREATININE 1.34*  CALCIUM 9.3   Recent Labs  Lab 12/17/20 0506  AST 25  ALT 24  ALKPHOS 50  BILITOT 0.5  PROT 6.4*  ALBUMIN 3.9   Recent Labs  Lab 12/17/20 0506  WBC 10.7*  NEUTROABS 6.6  HGB 14.6  HCT 42.6  MCV 93.8  PLT 185   No results for input(s): CKTOTAL, CKMB, CKMBINDEX, TROPONINI in the last 168 hours. Recent Labs    12/17/20 0506  LABPROT 12.6  INR 0.9   No results for input(s): COLORURINE, LABSPEC, PHURINE, GLUCOSEU, HGBUR, BILIRUBINUR, KETONESUR, PROTEINUR, UROBILINOGEN, NITRITE, LEUKOCYTESUR in the last 72 hours.  Invalid input(s): APPERANCEUR     Component Value Date/Time   CHOL 93 11/16/2020 0821   CHOL 109 09/22/2013 0908   TRIG 145 11/16/2020 0821   TRIG 99 09/22/2013 0908   HDL 23 (L) 11/16/2020 0821   HDL 25 (L) 09/22/2013 0908   CHOLHDL 4.0 11/16/2020 0821   VLDL 29 11/16/2020 0821   VLDL 20 09/22/2013 0908   LDLCALC 41 11/16/2020 0821   LDLCALC 71 08/22/2017 1005   LDLCALC 64 09/22/2013 0908   Lab Results  Component Value Date   HGBA1C 6.9 (H) 11/16/2020      Component Value Date/Time   LABOPIA NONE DETECTED 07/31/2019 2330   COCAINSCRNUR NONE DETECTED 07/31/2019 2330    LABBENZ NONE DETECTED 07/31/2019 2330   AMPHETMU NONE DETECTED 07/31/2019 2330   THCU NONE DETECTED 07/31/2019 2330   LABBARB NONE DETECTED 07/31/2019 2330    No results for input(s): ETH in the last 168 hours.  I have personally reviewed the radiological images below and agree with the radiology interpretations.  CT Angio Head W or Wo Contrast  Result Date: 12/17/2020 CLINICAL DATA:  Left-sided weakness EXAM: CT ANGIOGRAPHY HEAD AND NECK TECHNIQUE: Multidetector CT imaging of the head and neck was performed using the standard protocol during bolus administration of intravenous contrast. Multiplanar CT image reconstructions and MIPs were obtained to evaluate the vascular anatomy. Carotid stenosis measurements (when applicable) are obtained utilizing NASCET criteria, using the distal internal carotid diameter as the denominator. CONTRAST:  4m OMNIPAQUE IOHEXOL 350 MG/ML SOLN COMPARISON:  07/31/2019 FINDINGS: CTA NECK FINDINGS Aortic arch: Standard branching. Imaged portion shows no evidence of aneurysm or dissection. No significant stenosis of the major arch vessel origins. Right carotid system: Mixed density plaque at the bifurcation without flow limiting stenosis or ulceration. Left carotid system: Mixed density plaque at the bifurcation without flow limiting stenosis or ulceration. Vertebral arteries: No proximal subclavian stenosis. Strong left vertebral artery dominance. Skeleton: Degenerative endplate and facet spurring. Other neck: Negative Upper chest: Coronary calcification. Review of the  MIP images confirms the above findings CTA HEAD FINDINGS Anterior circulation: Atheromatous calcification affecting the carotid siphons. Abrupt cut off at the distal right M1 segment with some downstream branch reconstitution. Negative for aneurysm Posterior circulation: Basilar is fed solely by the left vertebral artery. Fetal type bilateral PCA flow. No branch occlusion, beading, or aneurysm. Venous  sinuses: Unremarkable for the arterial phase Anatomic variants: As above Review of the MIP images confirms the above findings Critical Value/emergent results were called by telephone at the time of interpretation on 12/17/2020 at 5:24 am to provider [facility] , who verbally acknowledged these results. IMPRESSION: 1. Emergent large vessel occlusion at the right M1 segment. 2. Nonobstructive atherosclerosis of the cervical and intracranial carotids. Electronically Signed   By: Jorje Guild M.D.   On: 12/17/2020 05:30   CT Angio Neck W and/or Wo Contrast  Result Date: 12/17/2020 CLINICAL DATA:  Left-sided weakness EXAM: CT ANGIOGRAPHY HEAD AND NECK TECHNIQUE: Multidetector CT imaging of the head and neck was performed using the standard protocol during bolus administration of intravenous contrast. Multiplanar CT image reconstructions and MIPs were obtained to evaluate the vascular anatomy. Carotid stenosis measurements (when applicable) are obtained utilizing NASCET criteria, using the distal internal carotid diameter as the denominator. CONTRAST:  57m OMNIPAQUE IOHEXOL 350 MG/ML SOLN COMPARISON:  07/31/2019 FINDINGS: CTA NECK FINDINGS Aortic arch: Standard branching. Imaged portion shows no evidence of aneurysm or dissection. No significant stenosis of the major arch vessel origins. Right carotid system: Mixed density plaque at the bifurcation without flow limiting stenosis or ulceration. Left carotid system: Mixed density plaque at the bifurcation without flow limiting stenosis or ulceration. Vertebral arteries: No proximal subclavian stenosis. Strong left vertebral artery dominance. Skeleton: Degenerative endplate and facet spurring. Other neck: Negative Upper chest: Coronary calcification. Review of the MIP images confirms the above findings CTA HEAD FINDINGS Anterior circulation: Atheromatous calcification affecting the carotid siphons. Abrupt cut off at the distal right M1 segment with some downstream  branch reconstitution. Negative for aneurysm Posterior circulation: Basilar is fed solely by the left vertebral artery. Fetal type bilateral PCA flow. No branch occlusion, beading, or aneurysm. Venous sinuses: Unremarkable for the arterial phase Anatomic variants: As above Review of the MIP images confirms the above findings Critical Value/emergent results were called by telephone at the time of interpretation on 12/17/2020 at 5:24 am to provider [facility], who verbally acknowledged these results. IMPRESSION: 1. Emergent large vessel occlusion at the right M1 segment. 2. Nonobstructive atherosclerosis of the cervical and intracranial carotids. Electronically Signed   By: JJorje GuildM.D.   On: 12/17/2020 05:30   MR ANGIO HEAD WO CONTRAST  Result Date: 12/17/2020 CLINICAL DATA:  Stroke follow-up. Status post thrombectomy for treatment of M1 occlusion. EXAM: MRI HEAD WITHOUT CONTRAST MRA HEAD WITHOUT CONTRAST TECHNIQUE: Multiplanar, multi-echo pulse sequences of the brain and surrounding structures were acquired without intravenous contrast. Angiographic images of the Circle of Willis were acquired using MRA technique without intravenous contrast. COMPARISON:  Head CT and CTA 12/17/2020.  Head MRI 08/01/2019. FINDINGS: MRI HEAD FINDINGS Brain: There is a small to moderate-sized acute right MCA infarct involving portions of the temporal, frontal, and parietal lobes and insula with associated petechial hemorrhage. A small chronic right frontal cortical infarct is again noted, and there are chronic bilateral cerebellar infarcts with associated chronic blood products. The ventricles are normal in size. No mass, midline shift, or extra-axial fluid collection is identified. Vascular: More fully evaluated below. Skull and upper cervical spine:  Unremarkable bone marrow signal. Sinuses/Orbits: Unremarkable orbits. Mild mucosal thickening in the paranasal sinuses. Small right and trace left mastoid effusions. Other:  None. MRA HEAD FINDINGS Anterior circulation: The internal carotid arteries are widely patent from skull base to carotid termini. ACAs and MCAs are patent without evidence of a proximal branch occlusion or significant proximal stenosis. Specifically, the recanalized right M1 segment remains patent without an underlying stenosis. No aneurysm is identified. Posterior circulation: The included intracranial portion of the left vertebral artery is widely patent and supplies the basilar. The right vertebral artery is not visualized and was shown to be hypoplastic on the earlier CTA. The basilar artery is widely patent. There is a large right posterior communicating artery. 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 No significant proximal PCA stenosis is evident. No aneurysm is identified. Anatomic variants: As above. IMPRESSION: 1. Small to moderate-sized acute right MCA infarct. 2. Chronic right frontal and bilateral cerebellar infarcts. 3. Negative head MRA. Electronically Signed   By: Logan Bores M.D.   On: 12/17/2020 14:55   MR BRAIN WO CONTRAST  Result Date: 12/17/2020 CLINICAL DATA:  Stroke follow-up. Status post thrombectomy for treatment of M1 occlusion. EXAM: MRI HEAD WITHOUT CONTRAST MRA HEAD WITHOUT CONTRAST TECHNIQUE: Multiplanar, multi-echo pulse sequences of the brain and surrounding structures were acquired without intravenous contrast. Angiographic images of the Circle of Willis were acquired using MRA technique without intravenous contrast. COMPARISON:  Head CT and CTA 12/17/2020.  Head MRI 08/01/2019. FINDINGS: MRI HEAD FINDINGS Brain: There is a small to moderate-sized acute right MCA infarct involving portions of the temporal, frontal, and parietal lobes and insula with associated petechial hemorrhage. A small chronic right frontal cortical infarct is again  noted, and there are chronic bilateral cerebellar infarcts with associated chronic blood products. The ventricles are normal in size. No mass, midline shift, or extra-axial fluid collection is identified. Vascular: More fully evaluated below. Skull and upper cervical spine: Unremarkable bone marrow signal. Sinuses/Orbits: Unremarkable orbits. Mild mucosal thickening in the paranasal sinuses. Small right and trace left mastoid effusions. Other: None. MRA HEAD FINDINGS Anterior circulation: The internal carotid arteries are widely patent from skull base to carotid termini. ACAs and MCAs are patent without evidence of a proximal branch occlusion or significant proximal stenosis. Specifically, the recanalized right M1 segment remains patent without an underlying stenosis. No aneurysm is identified. Posterior circulation: The included intracranial portion of the left vertebral artery is widely patent and supplies the basilar. The right vertebral artery is not visualized and was shown to be hypoplastic on the earlier CTA. The basilar artery is widely patent. There is a large right posterior communicating artery. 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 No significant proximal PCA stenosis is evident. No aneurysm is identified. Anatomic variants: As above. IMPRESSION: 1. Small to moderate-sized acute right MCA infarct. 2. Chronic right frontal and bilateral cerebellar infarcts. 3. Negative head MRA. Electronically Signed   By: Logan Bores M.D.   On: 12/17/2020 14:55   ECHOCARDIOGRAM COMPLETE  Result Date: 12/17/2020    ECHOCARDIOGRAM REPORT   Patient Name:   ADDAM FRIEDLANDER [place] Date of Exam: 12/17/2020 Medical Rec #:  [id number]      Height:       72.0 in Accession #:  [id number]     Weight:       245.1 lb Date of Birth:  11/02/1960      BSA:          2.323 m Patient Age:    41 years        BP:           122/72 mmHg Patient Gender: M              HR:           72 bpm. Exam Location:  Inpatient Procedure: 2D Echo, Cardiac Doppler and Color Doppler Indications:    Stroke 434.91 / I163.9  History:        Patient has prior history of Echocardiogram examinations, most                 recent 08/03/2019. Previous Myocardial Infarction, Stroke, Mitral                 Valve Disease; Risk Factors:Hypertension, Diabetes and                 Dyslipidemia. Substance abuse ([place]) (From Hx).  Sonographer:    Leavy Cella RDCS Sonographer#2:  Alvino Chapel RCS Referring Phys: [phone] MCNEILL P KIRKPATRICK IMPRESSIONS  1. Definity contrast utilized. There is a nonmobile LV mural thrombus (approximately 1.2 x 0.5 cm) in region of akinesis involving the apical septal wall. Reported to covering team at 5:10 PM.  2. Left ventricular ejection fraction, by estimation, is 60 to 65%. The left ventricle has normal function. The left ventricle demonstrates regional wall motion abnormalities (see scoring diagram/findings for description). There is mild left ventricular  hypertrophy. Left ventricular diastolic parameters are indeterminate.  3. Right ventricular systolic function is normal. The right ventricular size is normal. Tricuspid regurgitation signal is inadequate for assessing PA pressure.  4. The mitral valve is grossly normal. Trivial mitral valve regurgitation.  5. The aortic valve is tricuspid. Aortic valve regurgitation is not visualized.  6. The inferior vena cava is normal in size with greater than 50% respiratory variability, suggesting right atrial pressure of 3 mmHg. Comparison(s): Prior images reviewed side by side. LV mural thrombus newly identified. Prior study from May 2021 did have similar apical septal wall motion abnormality. FINDINGS  Left Ventricle: Left ventricular ejection fraction, by estimation, is 60 to 65%. The left ventricle has normal function. The left ventricle demonstrates regional wall motion  abnormalities. Definity contrast agent was given IV to delineate the left ventricular endocardial borders. The left ventricular internal cavity size was normal in size. There is mild left ventricular hypertrophy. Left ventricular diastolic parameters are indeterminate.  LV Wall Scoring: The apical septal segment and apex are akinetic. The apical anterior segment is hypokinetic. The anterior wall, entire lateral wall, anterior septum, entire inferior wall, mid inferoseptal segment, and basal inferoseptal segment are normal. Right Ventricle: The right ventricular size is normal. No increase in right ventricular wall thickness. Right ventricular systolic function is normal. Tricuspid regurgitation signal is inadequate for assessing PA pressure. Left Atrium: Left atrial size was normal in size. Right Atrium: Right atrial size was normal in size. Pericardium: There is no evidence of pericardial effusion. Presence of pericardial fat pad. Mitral Valve: The mitral valve is grossly normal. Trivial mitral valve regurgitation. Tricuspid Valve: The tricuspid valve is grossly normal. Tricuspid valve regurgitation is trivial. Aortic Valve: The aortic valve is tricuspid. There is mild aortic valve annular calcification. Aortic valve regurgitation is not visualized. Pulmonic Valve: The  pulmonic valve was not well visualized. Pulmonic valve regurgitation is not visualized. Aorta: The aortic root is normal in size and structure. Venous: The inferior vena cava is normal in size with greater than 50% respiratory variability, suggesting right atrial pressure of 3 mmHg. IAS/Shunts: No atrial level shunt detected by color flow Doppler.  LEFT VENTRICLE PLAX 2D LVIDd:         5.40 cm  Diastology LVIDs:         3.50 cm  LV e' medial:    6.85 cm/s LV PW:         1.10 cm  LV E/e' medial:  8.9 LV IVS:        1.10 cm  LV e' lateral:   7.83 cm/s LVOT diam:     2.00 cm  LV E/e' lateral: 7.8 LVOT Area:     3.14 cm  RIGHT VENTRICLE RV S prime:      15.90 cm/s TAPSE (M-mode): 2.4 cm LEFT ATRIUM             Index       RIGHT ATRIUM          Index LA diam:        3.50 cm 1.51 cm/m  RA Area:     9.85 cm LA Vol (A2C):   48.8 ml 21.01 ml/m RA Volume:   21.30 ml 9.17 ml/m LA Vol (A4C):   18.1 ml 7.79 ml/m LA Biplane Vol: 30.1 ml 12.96 ml/m   AORTA Ao Root diam: 2.80 cm MITRAL VALVE MV Area (PHT): 3.08 cm    SHUNTS MV Decel Time: 246 msec    Systemic Diam: 2.00 cm MV E velocity: 60.80 cm/s MV A velocity: 78.60 cm/s MV E/A ratio:  0.77 Rozann Lesches MD Electronically signed by Rozann Lesches MD Signature Date/Time: 12/17/2020/5:19:12 PM    Final    CUP PACEART REMOTE DEVICE CHECK  Result Date: 12/14/2020 ILR summary report received. Battery status OK. Normal device function. No new symptom, tachy, brady, or pause episodes. No new AF episodes. Monthly summary reports and ROV/PRN  CT HEAD CODE STROKE WO CONTRAST  Result Date: 12/17/2020 CLINICAL DATA:  Code stroke.  Left-sided weakness EXAM: CT HEAD WITHOUT CONTRAST TECHNIQUE: Contiguous axial images were obtained from the base of the skull through the vertex without intravenous contrast. COMPARISON:  08/02/2019 FINDINGS: Brain: Small chronic high right frontal cortex infarct. No convincing acute infarct. No acute hemorrhage. Remote bilateral cerebellar infarcts. No hydrocephalus or mass. Vascular: Hyperdense right M1 segment Skull: Negative Sinuses/Orbits: Negative Other: Critical Value/emergent results were called by telephone at the time of interpretation on 12/17/2020 at 5:24 am to provider [facility] , who verbally acknowledged these results. ASPECTS [facility] Stroke Program Early CT Score) - Ganglionic level infarction (caudate, lentiform nuclei, internal capsule, insula, M1-M3 cortex): 7 - Supraganglionic infarction (M4-M6 cortex): 3 when accounting for chronic cortex infarct Total score (0-10 with 10 being normal): 10 IMPRESSION: 1. Hyperdense right M1 segment, reference CTA. 2. No acute  hemorrhage or visible acute infarct. 3. Remote right frontal cortex and bilateral cerebellar infarcts. Electronically Signed   By: Jorje Guild M.D.   On: 12/17/2020 05:25     PHYSICAL EXAM  Temp:  [97.6 F (36.4 C)-98.1 F (36.7 C)] 98.1 F (36.7 C) (09/17 1954) Pulse Rate:  [55-93] 66 (09/17 1900) Resp:  [11-26] 16 (09/17 1900) BP: (74-147)/(31-108) 113/72 (09/17 1900) SpO2:  [91 %-100 %] 95 % (09/17 1900) Arterial Line BP: (108-153)/(66-90) 130/74 (09/17 1230) Weight:  [  111.2 kg] 111.2 kg (09/17 0523)  General - Well nourished, well developed, in no apparent distress.  Ophthalmologic - fundi not visualized due to noncooperation.  Cardiovascular - Regular rhythm and rate.  Neuro - awake, alert, eyes open, orientated to age and people, however, word salad for other orientation questions. Expressive >> receptive aphasia, following all central commands but some peripheral commands. Not able to name and repeat. No gaze palsy, tracking bilaterally, visual field full, PERRL. Mild right facial droop. Tongue midline. Bilateral UEs 5/5, no drift. Bilaterally LEs 5/5, no drift. Sensation symmetrical bilaterally, b/l FTN intact, gait not tested.     ASSESSMENT/PLAN Karl Morgan is a 60 y.o. left-handed male with history of stroke, diabetes, hypertension, hyperlipidemia, CAD/MI, CKD admitted for left-sided weakness and aphasia. No tPA given due to outside window.    Stroke:  right MCA infarct due to right M1 occlusion status post IR with TICI2c revascularization, embolic pattern, secondary to LV thrombus Resultant expressive aphasia CT head no acute abnormality, old right frontal and bilateral cerebellar infarcts.  Hyperdense right MCA sign. CTA head neck right M1 occlusion S/p IR with TICI2c revascularization of right M1 MRI right MCA moderately large infarct with petechial hemorrhage MRA right M1 patent 2D Echo nonmobile LV mural thrombus (approximately 1.2 x 0.5 cm) in region of  akinesis involving the apical septal wall.  LDL pending HgbA1c pending Heparin IV for VTE prophylaxis aspirin 81 mg daily and clopidogrel 75 mg daily prior to admission, now on heparin IV due to LV thrombus Patient counseled to be compliant with his antithrombotic medications Ongoing aggressive stroke risk factor management Therapy recommendations: Pending Disposition: Pending  LV thrombus TTE showed nonmobile LV mural thrombus (approximately 1.2 x 0.5 cm) in region of akinesis involving the apical septal wall.  Discussed with cardiology on-call Dr. Denice Paradise, recommend cardiac MRI to confirm LV thrombus Continue heparin IV Loop recorder so far no A. fib 08/2019 TEE showed EF 50 to 55%, no regional wall motion abnormalities, no PFO If LV thrombus confirmed, will required official cardiology consult.  History of stroke 02/2015, left arm weakness, difficulty walking, MRI showed right motor strip small infarct.  EF 60 to 65%, MRA head and neck right VA occlusion.  A1c 7.2, LDL 57, discharged with DAPT 08/2019 admitted for right superior cerebellum infarct with hemorrhagic transformation.  CT head and neck right VA chronic occlusion.  EF 65 to 70%.  TEE showed EF 50 to 55%, no regional wall motion abnormalities, no PFO.  Loop recorder placed.  LDL 75, A1c 8.7, discharged with DAPT and Lipitor. Followed with Dr. Leonie Man and Janett Billow NP at [facility], loop recorder negative for A. fib.  Diabetes HgbA1c pending goal < 7.0 CBG monitoring SSI DM education and close PCP follow up  Hypertension Stable BP goal 120-140 post IR 24h Cleviprex as needed Long term BP goal normotensive  Hyperlipidemia Home meds: Lipitor 80 and Zetia 10 LDL pending, goal < 70 Now on Lipitor 80 and Zetia 10 Continue statin at discharge  Other Stroke Risk Factors Obesity, BMI 33.25 Coronary artery disease/MI  Other Active Problems CKD IIIA, creatinine 1.34  Hospital day # 0  This patient is critically ill due to large  right MCA infarct due to right M1 occlusion status post thrombectomy, LV thrombus, history of stroke and at significant risk of neurological worsening, death form recurrent stroke, heart failure, systemic embolism, seizure. This patient's care requires constant monitoring of vital signs, hemodynamics, respiratory and cardiac monitoring, review of multiple  databases, neurological assessment, discussion with family, other specialists and medical decision making of high complexity. I spent 55 minutes of neurocritical care time in the care of this patient. I had long discussion with wife and patient at bedside, updated pt current condition, treatment plan and potential prognosis, and answered all the questions.  They expressed understanding and appreciation.   Rosalin Hawking, MD PhD Stroke Neurology 12/17/2020 8:09 PM    To contact Stroke Continuity provider, please refer to [email or web address] After hours, contact General Neurology

## 2020-12-17 NOTE — Progress Notes (Signed)
NeuroInterventional Radiology  Pre-Procedure Note  History: 60 yo male presents as acute code stroke, to [facility] ED, with wife providing history that she saw him last well at about 10:30pm when she went to bed. She then discovered him on the floor bedside at about 430am with left sided weakness.  He was brought to the ED for evaluation  His wife confirms he is left-handed individual.   Baseline mRS: 0 NIHSS:  20  CT ASPECTS:  10 CTA:   Right m1 occlusion CTP:   N/a   I have discussed the case with Dr. Leonel Ramsay of Stroke Neurology.  Given the patient's symptoms, imaging findings, baseline function, we agree they are an appropriate candidate for attempt for mechanical thrombectomy.  His aspects is normal, and for the sake of speed, I would suggest that CT perfusion is not necessary in this scenario even though he is outside of 6 hr window.   The risks and benefits of the procedure were discussed with the patient/patient's family, with specific risks including: bleeding, infection, arterial injury/dissection, contrast reaction, kidney injury, need for further procedure/surgery, neurologic deficit, 10-15% risk of intracranial hemorrhage, cardiopulmonary collapse, death. All questions were answered.  The patient/family would like to proceed with attempt at thrombectomy.    Plan for cerebral angiogram and attempt at mechanical thrombectomy.   Signed,   Dulcy Fanny. Earleen Newport, DO

## 2020-12-17 NOTE — ED Notes (Signed)
Pt returned from CT, pt now moving L arm spontaneously to scratch nose after covid swab, pt continues to be nonverbal

## 2020-12-17 NOTE — ED Notes (Signed)
Dr. Jack Quarto on telestroke speaking with dr. Starleen Blue

## 2020-12-17 NOTE — ED Notes (Signed)
Pt became diaphoretic, more lethargic and drooling in triage room.  Patient wheeled back to room 19 and lifted to hospital bed.  Dr. Starleen Blue at bedside on arrival.

## 2020-12-17 NOTE — Anesthesia Postprocedure Evaluation (Signed)
Anesthesia Post Note  Patient: Karl Morgan  Procedure(s) Performed: IR WITH ANESTHESIA     Patient location during evaluation: PACU Anesthesia Type: General Level of consciousness: awake and alert Pain management: pain level controlled Vital Signs Assessment: post-procedure vital signs reviewed and stable Respiratory status: spontaneous breathing, nonlabored ventilation, respiratory function stable and patient connected to nasal cannula oxygen Cardiovascular status: blood pressure returned to baseline and stable Postop Assessment: no apparent nausea or vomiting Anesthetic complications: no   No notable events documented.  Last Vitals:  Vitals:   12/17/20 1330 12/17/20 1345  BP: 128/73 135/81  Pulse:    Resp:    Temp:    SpO2:      Last Pain:  Vitals:   12/17/20 1200  TempSrc: Axillary                 March Rummage Micayla Brathwaite

## 2020-12-17 NOTE — ED Notes (Signed)
Code Stroke activated.

## 2020-12-17 NOTE — ED Notes (Signed)
Received verbal report from [facility] at [facility]. Pt has hx of CVA hemorrhagiv 08/2019. Went to sleep at 2230 om 12/16/2020. Wife found 12/17/20 at 0430 on the floor talking and confused. Pt wouldn't let wife call 911 she transported pt to hosp. On arrival to hosp pt was lethargic, diapphoretic, non-verbal, no movement on lt side. At 0500 pt had ct scan completed when returned moving lt arm and leg but still had drift in both. When speaking to him on the lt side will not follow commands or follow you but will when you speak to him on the rt side. +ataxia, NIH 20. Rt M1 occlusion coming for IR accepted by Dr. Leonel Ramsay. PIV 18 ga in lt and rt fa. Labs sent and  covid sent. Wife will come by POV. Coming by [facility]

## 2020-12-17 NOTE — Procedures (Addendum)
Neuro-Interventional Radiology  Post Cerebral Angiogram Procedure Note  Operator:    Dr. Earleen Newport Assistant:   None  History:   60 yo male, left-handed, presents as acute code stroke, to [facility] ED, with wife providing history that she saw him last well at about 10:30pm when she went to bed. She then discovered him on the floor bedside at about 430am with left sided weakness.  He was brought to the ED for evaluation.   Right m1 occlusion, with corresponding left upper and lower weakness.   Baseline mRS:  0 NIHSS:   20 Site of occlusion:  Right m1   Procedure: US guided right CFA access Cervical & Cerebral Angiogram Mechanical Thrombectomy x 4 Deployment of Angioseal Flat Panel CT in NIR  First Pass Device:  Zoom 71    2nd Pass Device:  Zoom 71 + 15m x 454msolitaire    3rd Pass Device:  Zoom 35   4th Pass Device:  Trevo trak 21  Result   TICI 2c  Findings:   M1 occlusion after temporal branch  The thrombus/embolus fragmented after each pass, with distal mobilization after each pass.  The final pass was made to attempt thrombectomy of inferior division M4/cortical branch.   Anesthesia:   GETA  EBL:    ~[id number]   Complication:  SAH parietal ([place] 3c)   Medication: IV tPA administered?: no IA Medication:  no  Recommendations: - right hip straight x 4 hrs - Goal SBP 120-140.   - Frequent NV checks - Repeat CT or MRI imaging recommended within 36 hours, discretion of Neurology - NIR to follow   Signed,  JaDulcy FannyWaEarleen NewportDO

## 2020-12-17 NOTE — Anesthesia Procedure Notes (Signed)
Procedure Name: Intubation Date/Time: 12/17/2020 6:47 AM Performed by: Inda Coke, CRNA Pre-anesthesia Checklist: Patient identified, Emergency Drugs available, Suction available and Patient being monitored Patient Re-evaluated:Patient Re-evaluated prior to induction Oxygen Delivery Method: Circle System Utilized Preoxygenation: Pre-oxygenation with 100% oxygen Induction Type: IV induction and Rapid sequence Ventilation: Mask ventilation without difficulty Laryngoscope Size: Mac and 4 Grade View: Grade I Tube type: Oral Tube size: 7.5 mm Number of attempts: 1 Airway Equipment and Method: Stylet and Oral airway Placement Confirmation: ETT inserted through vocal cords under direct vision, positive ETCO2 and breath sounds checked- equal and bilateral Secured at: 23 cm Tube secured with: Tape Dental Injury: Teeth and Oropharynx as per pre-operative assessment

## 2020-12-17 NOTE — Progress Notes (Signed)
PT Cancellation Note  Patient Details Name: Karl Morgan MRN: [id number] DOB: 12/27/1960   Cancelled Treatment:    Reason Eval/Treat Not Completed: Active bedrest order Will follow up as pt medically appropriate and as schedule allows.   Lou Miner, DPT  Acute Rehabilitation Services  Pager: [phone] Office: [phone]    Rudean Hitt 12/17/2020, 12:31 PM

## 2020-12-17 NOTE — Anesthesia Procedure Notes (Signed)
Arterial Line Insertion Start/End9/17/2022 6:50 AM, 12/17/2020 6:55 AM Performed by: Catalina Gravel, MD, anesthesiologist  Patient location: Pre-op. Preanesthetic checklist: patient identified, IV checked, site marked, risks and benefits discussed, surgical consent, monitors and equipment checked, pre-op evaluation, timeout performed and anesthesia consent Lidocaine 1% used for infiltration Left, radial was placed Catheter size: 20 G Hand hygiene performed  and maximum sterile barriers used   Attempts: 1 Procedure performed without using ultrasound guided technique. Following insertion, dressing applied and Biopatch. Post procedure assessment: normal and unchanged  Post procedure complications: second provider assisted. Patient tolerated the procedure well with no immediate complications.

## 2020-12-17 NOTE — H&P (Signed)
Neurology H&P  CC: Left sided weakness  History is obtained from:Wife, chart review  HPI: Karl Morgan is a 60 y.o. male with a history of previous cryptogenic stroke who was seen by his wife when she went to bed at 10:30 pm. She heard him at 4:15 fall, and then noted him to have some left sided weakness but this was mild. She drove him to the ER and he walked into the ER under his own power.  While he was in the ER, he decompensated, developing severe left-sided hemiparesis and a code stroke was activated.  Initially I thought that he had been last known well on arrival to the emergency department, but he was still symptomatic throughout that timeframe.  He did not have a CT perfusion done, but after he arrived and I clarified with other providers, he was already being moved to the IR table.  At that point, given that he was waxing and waning and had an aspects of 10, the decision was made to not delay care to obtain a CT perfusion at that time as it would be a significant delay and clinically there was strong reason to suspect penumbra and the clinical decompensation was still within 6 hours.  LKW: 10:30 pm tpa given?: No, out of window IR Thrombectomy? Yes Modified Rankin Scale: 0-Completely asymptomatic and back to baseline post- stroke NIHSS score: 17 1A: Level of Consciousness - 0 1B: Ask Month and Age - 2 1C: 'Blink Eyes' & 'Squeeze Hands' - 2 2: Test Horizontal Extraocular Movements - 1 3: Test Visual Fields - 2 4: Test Facial Palsy - 0 5A: Test Left Arm Motor Drift - 1 5B: Test Right Arm Motor Drift - 0 6A: Test Left Leg Motor Drift - 1 6B: Test Right Leg Motor Drift - 0 7: Test Limb Ataxia - 0 8: Test Sensation - 1 9: Test Language/Aphasia- 3 10: Test Dysarthria - 2 11: Test Extinction/Inattention - 2  ROS:  Unable to obtain due to altered mental status.   Past Medical History:  Diagnosis Date   Alopecia 04/04/2011   Diabetes mellitus    Erectile dysfunction 04/04/2011    Heart murmur    Hyperlipidemia 04/04/2011   Hypogonadism male 04/04/2011   Myocardial infarction [facility])    Stroke [facility])    Substance abuse ([place])    Type II or unspecified type diabetes mellitus without mention of complication, uncontrolled 04/04/2011   Unspecified essential hypertension 04/04/2011    Family History  Problem Relation Age of Onset   Hypertension Father    Dementia Father        died age 35 in 06-26-17   Memory loss Father    Emphysema Mother    COPD Mother    Thyroid disease Mother    Cancer Neg Hx    Diabetes Neg Hx    Heart disease Neg Hx    Stroke Neg Hx      Social History:  reports that he has never smoked. He has never used smokeless tobacco. He reports that he does not drink alcohol and does not use drugs.   Prior to Admission medications   Medication Sig Start Date End Date Taking? Authorizing Provider  alprostadil (EDEX) 20 MCG injection 20 mcg by Intracavitary route as needed for erectile dysfunction. use no more than 3 times per week    [provider]  aspirin EC 81 MG tablet Take 1 tablet (81 mg total) by mouth daily. STOP TAKING after 21 days and  continue PLAVIX ALONE 08/04/19   Donzetta Starch, NP  atorvastatin (LIPITOR) 80 MG tablet TAKE 1 TABLET BY MOUTH DAILY AT 6 PM. 10/31/20 10/31/21  McLean-Scocuzza, Nino Glow, MD  blood glucose meter kit and supplies Dispense based on patient and insurance preference. Use up to bid daily as directed. (FOR ICD-10 E10.9, E11.9). 1 year supply of lancets and strips 08/06/19   McLean-Scocuzza, Nino Glow, MD  clomiPHENE (CLOMID) 50 MG tablet TAKE 1 TABLET BY MOUTH 3 DAYS A WEEK 12/01/19 01/30/21  Franchot Gallo, MD  clopidogrel (PLAVIX) 75 MG tablet TAKE 1 TABLET BY MOUTH ONCE DAILY 08/25/20     Continuous Blood Gluc Receiver (FREESTYLE LIBRE 14 DAY READER) DEVI 1 Device by Does not apply route in the morning and at bedtime. Patient not taking: Reported on 11/08/2020 08/06/19   McLean-Scocuzza, Nino Glow, MD  dapagliflozin  propanediol (FARXIGA) 10 MG TABS tablet TAKE 1 TABLET (10 MG TOTAL) BY MOUTH EVERY MORNING 11/08/20 11/08/21  McLean-Scocuzza, Nino Glow, MD  ezetimibe (ZETIA) 10 MG tablet TAKE 1 TABLET (10 MG TOTAL) BY MOUTH DAILY. 08/08/20 08/08/21  McLean-Scocuzza, Nino Glow, MD  FREESTYLE LITE test strip 1 each 3 (three) times daily. 08/10/19   [provider]  glimepiride (AMARYL) 2 MG tablet TAKE 1 TABLET (2 MG TOTAL) BY MOUTH 2 (TWO) TIMES DAILY WITH A MEAL. 05/10/20 05/10/21  McLean-Scocuzza, Nino Glow, MD  Lancets (FREESTYLE) lancets 1 each 3 (three) times daily. 08/10/19   [provider]  levothyroxine (SYNTHROID) 75 MCG tablet Take 1 tablet (75 mcg total) by mouth daily. 11/16/20   Dutch Quint B, FNP  lisinopril (ZESTRIL) 40 MG tablet TAKE 1 TABLET (40 MG TOTAL) BY MOUTH DAILY. 05/10/20 05/10/21  McLean-Scocuzza, Nino Glow, MD  metFORMIN (GLUCOPHAGE) 850 MG tablet TAKE 1 TABLET BY MOUTH TWICE DAILY 11/08/20 11/08/21  McLean-Scocuzza, Nino Glow, MD  metoprolol succinate (TOPROL-XL) 50 MG 24 hr tablet TAKE 1 TABLET BY MOUTH DAILY. TAKE WITH OR IMMEDIATELY FOLLOWING A MEAL. 11/08/20 11/08/21  McLean-Scocuzza, Nino Glow, MD  omeprazole (PRILOSEC OTC) 20 MG tablet Take 20 mg by mouth daily as needed (acid reflux/heartburn).    [provider]  sildenafil (VIAGRA) 100 MG tablet Take 0.5-1 tablets (50-100 mg total) by mouth daily as needed for erectile dysfunction. Patient taking differently: Take 100 mg by mouth daily as needed for erectile dysfunction. 11/10/12   Wendie Agreste, MD  sitaGLIPtin (JANUVIA) 100 MG tablet TAKE 1 TABLET (100 MG TOTAL) BY MOUTH DAILY. 05/10/20 05/10/21  McLean-Scocuzza, Nino Glow, MD  valACYclovir (VALTREX) 1000 MG tablet take 1 to 2 tablets by mouth every 12 hours for 2 to 3 days as needed for fever blisters 11/08/20   McLean-Scocuzza, Nino Glow, MD     Exam: Current vital signs: There were no vitals filed for this visit.    Physical Exam  Constitutional: Appears well-developed and  well-nourished.  Psych: Affect appropriate to situation Eyes: No scleral injection HENT: No OP obstrucion Head: Normocephalic.  Cardiovascular: Normal rate and regular rhythm.  Respiratory: Effort normal and breath sounds normal to anterior ascultation GI: Soft.  No distension. There is no tenderness.  Skin: WDI  Neuro: Mental Status: Patient is awake, he does not follow command, severe neglect. When I ask him to make a fist, he presents an open hand.  Cranial Nerves: II: Does not blink to threat from the left. Pupils are equal, round, and reactive to light.   III,IV, VI: R gaze preference, but does cross midline to  the left briefly V: Facial sensation is symmetric to temperature VII: Facial movement is symmetric.  VIII: hearing is intact to voice Motor: Tone is normal. Bulk is normal. 5/5 strength was present on the right, he has mild 4+/5 weakness on the left Sensory: He responds to noxious stimulation bilaterally, but less on the left than right.  Cerebellar: No clear ataxia   I have reviewed labs in epic and the pertinent results are: Cr 1.34 WBC 10.7 INR 0.9 CBG 167   I have reviewed the images obtained: CT-no acute findings CTA right M1 occlusion, no clear occlusion on the left  Primary Diagnosis:  Cerebral infarction due to embolism of  right middle cerebral artery.   Secondary Diagnosis: Essential (primary) hypertension and Type 2 diabetes mellitus w/o complications   Impression: 60 year old male with a history of previous cryptogenic stroke who presents with M1 occlusion.  There is likely embolic in nature.  Though his technical last seen well is more than 6 hours ago, given that he has demonstrated collateralization with waxing and waning exam and had an aspects of 10, the decision was made to not delay care any further by pursuing CT perfusion and basing the intervention on his aspect score.  Plan: - HgbA1c, fasting lipid panel - MRI of the brain without  contrast - Frequent neuro checks - Echocardiogram - Prophylactic therapy-pending outcome of intervention - Risk factor modification - Telemetry monitoring - PT consult, OT consult, Speech consult - Stroke team to follow    This patient is critically ill and at significant risk of neurological worsening, death and care requires constant monitoring of vital signs, hemodynamics,respiratory and cardiac monitoring, neurological assessment, discussion with family, other specialists and medical decision making of high complexity. I spent 65 minutes of neurocritical care time  in the care of  this patient. This was time spent independent of any time provided by nurse practitioner or PA.  Roland Rack, MD [facility] [phone]  If 7pm- 7am, please page neurology on call as listed in [place].

## 2020-12-17 NOTE — ED Notes (Signed)
Pt transported to IR on EMS stretcher

## 2020-12-17 NOTE — ED Triage Notes (Signed)
Pt to ED from home with wife from home c/o aphasia.  Wife states she went to bed at 2230 last night and patient was fine, woke up to patient falling out of bed around 0430 today having trouble getting his words out.  Pt unable to answer questions in triage but is attempting to answer, difficulty following commands.

## 2020-12-17 NOTE — Transfer of Care (Signed)
Immediate Anesthesia Transfer of Care Note  Patient: Karl Morgan  Procedure(s) Performed: IR WITH ANESTHESIA  Patient Location: ICU  Anesthesia Type:General  Level of Consciousness: awake  Airway & Oxygen Therapy: Patient Spontanous Breathing and Patient connected to nasal cannula oxygen  Post-op Assessment: Report given to RN, Post -op Vital signs reviewed and stable and Patient moving all extremities X 4  Post vital signs: Reviewed and stable  Last Vitals:  Vitals Value Taken Time  BP 128/69 12/17/20 0823  Temp    Pulse 65 12/17/20 0835  Resp 16 12/17/20 0835  SpO2 100 % 12/17/20 0835  Vitals shown include unvalidated device data.  Last Pain: There were no vitals filed for this visit.       Complications: No notable events documented.

## 2020-12-17 NOTE — Progress Notes (Signed)
Definity given for echocardiogram.  Alvino Chapel, RCS

## 2020-12-17 NOTE — Plan of Care (Signed)
2D echo report reviewed and it showed nonmobile LV mural thrombus (approximately 1.2 x 0.5 cm) in region of akinesis involving the apical septal wall. His last TEE in 08/2019 showed no wall motion abnormalities.   His MRI showed right MCA moderately large infarct with petechial hemorrhage. MRA showed patent right MCA. Pt expressive aphasia continues to improve. I have updated pt and wife the MRI result and echo result at the bedside.   I called cardiology Dr. Juliane Lack and he agrees with heparin IV for anticoagulation, but recommend cardiac MRI to confirm the LV thrombus. If it confirmed, cardiology consult will be needed for further work up. Will do heparin IV per stroke protocol with close neuro monitoring.   Rosalin Hawking, MD PhD Stroke Neurology 12/17/2020 5:50 PM

## 2020-12-17 NOTE — Sedation Documentation (Signed)
8 Fr angioseal deployed at [phone]

## 2020-12-17 NOTE — ED Notes (Signed)
While in CT pt looking at [place] when stimulated and smiled

## 2020-12-17 NOTE — Progress Notes (Signed)
ANTICOAGULATION CONSULT NOTE - Initial Consult  Pharmacy Consult for heparin Indication:  LV Thrombus  Allergies  Allergen Reactions   Other     Rybelsus ab pain at 3 mg, 7 mg n/v   Pravachol [Pravastatin Sodium] Other (See Comments)    Muscle aches    Patient Measurements:   Heparin Dosing Weight: 101 kg   Vital Signs: Temp: 98 F (36.7 C) (09/17 1600) Temp Source: Axillary (09/17 1600) BP: 122/72 (09/17 1645) Pulse Rate: 78 (09/17 1645)  Labs: Recent Labs    12/17/20 0506  HGB 14.6  HCT 42.6  PLT 185  APTT 26  LABPROT 12.6  INR 0.9  CREATININE 1.34*    Estimated Creatinine Clearance: 74.5 mL/min (A) (by C-G formula based on SCr of 1.34 mg/dL (H)).   Medical History: Past Medical History:  Diagnosis Date   Alopecia 04/04/2011   Diabetes mellitus    Erectile dysfunction 04/04/2011   Heart murmur    Hyperlipidemia 04/04/2011   Hypogonadism male 04/04/2011   Myocardial infarction [facility]    Stroke [facility])    Substance abuse ([place])    Type II or unspecified type diabetes mellitus without mention of complication, uncontrolled 04/04/2011   Unspecified essential hypertension 04/04/2011    Medications:  Medications Prior to Admission  Medication Sig Dispense Refill Last Dose   alprostadil (EDEX) 20 MCG injection 20 mcg by Intracavitary route as needed for erectile dysfunction. use no more than 3 times per week      aspirin EC 81 MG tablet Take 1 tablet (81 mg total) by mouth daily. STOP TAKING after 21 days and continue PLAVIX ALONE 21 tablet 0    atorvastatin (LIPITOR) 80 MG tablet TAKE 1 TABLET BY MOUTH DAILY AT 6 PM. (Patient taking differently: Take 80 mg by mouth daily.) 90 tablet 3    blood glucose meter kit and supplies Dispense based on patient and insurance preference. Use up to bid daily as directed. (FOR ICD-10 E10.9, E11.9). 1 year supply of lancets and strips 1 each 0    clomiPHENE (CLOMID) 50 MG tablet TAKE 1 TABLET BY MOUTH 3 DAYS A WEEK (Patient taking  differently: Take 50 mg by mouth 3 (three) times a week.) 45 tablet 3    clopidogrel (PLAVIX) 75 MG tablet TAKE 1 TABLET BY MOUTH ONCE DAILY (Patient taking differently: Take 75 mg by mouth daily.) 90 tablet 3    Continuous Blood Gluc Receiver (FREESTYLE LIBRE 14 DAY READER) DEVI 1 Device by Does not apply route in the morning and at bedtime. (Patient not taking: No sig reported) 2 each 11    dapagliflozin propanediol (FARXIGA) 10 MG TABS tablet TAKE 1 TABLET (10 MG TOTAL) BY MOUTH EVERY MORNING (Patient taking differently: Take 10 mg by mouth daily.) 90 tablet 3    ezetimibe (ZETIA) 10 MG tablet TAKE 1 TABLET (10 MG TOTAL) BY MOUTH DAILY. (Patient taking differently: Take 10 mg by mouth daily.) 90 tablet 3    FREESTYLE LITE test strip 1 each 3 (three) times daily.      glimepiride (AMARYL) 2 MG tablet TAKE 1 TABLET (2 MG TOTAL) BY MOUTH 2 (TWO) TIMES DAILY WITH A MEAL. (Patient taking differently: Take 2 mg by mouth in the morning and at bedtime.) 180 tablet 3    Lancets (FREESTYLE) lancets 1 each 3 (three) times daily.      levothyroxine (SYNTHROID) 75 MCG tablet Take 1 tablet (75 mcg total) by mouth daily. 90 tablet 3    lisinopril (ZESTRIL)  40 MG tablet TAKE 1 TABLET (40 MG TOTAL) BY MOUTH DAILY. (Patient taking differently: Take 40 mg by mouth daily.) 90 tablet 3    metFORMIN (GLUCOPHAGE) 850 MG tablet TAKE 1 TABLET BY MOUTH TWICE DAILY (Patient taking differently: Take 850 mg by mouth 2 (two) times daily.) 180 tablet 3    metoprolol succinate (TOPROL-XL) 50 MG 24 hr tablet TAKE 1 TABLET BY MOUTH DAILY. TAKE WITH OR IMMEDIATELY FOLLOWING A MEAL. (Patient taking differently: Take 50 mg by mouth daily.) 90 tablet 3    omeprazole (PRILOSEC OTC) 20 MG tablet Take 20 mg by mouth daily as needed (acid reflux/heartburn).      sildenafil (VIAGRA) 100 MG tablet Take 0.5-1 tablets (50-100 mg total) by mouth daily as needed for erectile dysfunction. (Patient taking differently: Take 100 mg by mouth daily as  needed for erectile dysfunction.) 5 tablet 11    sitaGLIPtin (JANUVIA) 100 MG tablet TAKE 1 TABLET (100 MG TOTAL) BY MOUTH DAILY. 90 tablet 3    valACYclovir (VALTREX) 1000 MG tablet take 1 to 2 tablets by mouth every 12 hours for 2 to 3 days as needed for fever blisters (Patient taking differently: Take 500-1,000 mg by mouth 2 (two) times daily as needed (fever blisters).) 90 tablet 3     Assessment: 48 YOM who presented as a code stroke found to have a R M1 occlusion s/p thrombectomy. MRI showed moderately large R MCA infarct with petechial hemorrhage. 2D ECHO showed an LV thrombus and pharmacy consulted to start IV heparin for treatment.   H/H and Plt wnl. SCr mildly elevated.   Give moderately large acute stroke and petechial hemorrhage, will start heparin very conservatively WITHOUT boluses aiming for a conservative anti-Xa range.   Goal of Therapy:  Heparin level 0.3-0.5 units/ml Monitor platelets by anticoagulation protocol: Yes   Plan:  -Start IV heparin at 1400 units/hr -F/u 6 hr HL -Monitor daily HL, CBC and s/s of bleeding   Albertina Parr, PharmD., BCPS, BCCCP Clinical Pharmacist Please refer to [facility] for unit-specific pharmacist

## 2020-12-17 NOTE — ED Notes (Signed)
Pt arrived

## 2020-12-17 NOTE — Anesthesia Procedure Notes (Signed)
Arterial Line Insertion Start/End9/17/2022 6:45 AM, 12/17/2020 6:56 AM Performed by: Catalina Gravel, MD, anesthesiologist  Patient location: OOR procedure area. Preanesthetic checklist: patient identified, IV checked and monitors and equipment checked Left, radial was placed Catheter size: 20 G Hand hygiene performed   Attempts: 1 Procedure performed without using ultrasound guided technique. Following insertion, Biopatch. Post procedure assessment: normal  Patient tolerated the procedure well with no immediate complications.

## 2020-12-17 NOTE — ED Provider Notes (Addendum)
[facility]  ____________________________________________   Event Date/Time   First MD Initiated Contact with Patient 12/17/20 0510     (approximate)  I have reviewed the triage vital signs and the nursing notes.   HISTORY  Chief Complaint Aphasia    HPI Karl Morgan is a 60 y.o. male with past medical history of diabetes, hypertension, prior cerebellar stroke with hemorrhagic conversion on dual antiplatelet therapy who presents with aphasia.  Patient's last known normal was 10:30 pm.  Patient's wife notes that he fell out of bed and she found him on the floor having some difficulty speaking.  On the way to the hospital he became incoherent and was having difficulty speaking.  I am unable to obtain additional history from the patient given his aphasia and altered mental status.         Past Medical History:  Diagnosis Date   Alopecia 04/04/2011   Diabetes mellitus    Erectile dysfunction 04/04/2011   Heart murmur    Hyperlipidemia 04/04/2011   Hypogonadism male 04/04/2011   Myocardial infarction [facility])    Stroke [facility])    Substance abuse ([place])    Type II or unspecified type diabetes mellitus without mention of complication, uncontrolled 04/04/2011   Unspecified essential hypertension 04/04/2011    Patient Active Problem List   Diagnosis Date Noted   Hypertension associated with diabetes ([place]) 11/08/2020   Elevated TSH 05/10/2020   Gallbladder polyp 11/12/2019   Fatty liver 11/12/2019   Bilateral carotid artery stenosis 08/06/2019   Mitral valve insufficiency 08/06/2019   Uncontrolled type 2 diabetes mellitus with hyperglycemia ([place]) 08/06/2019   Cerebrovascular accident (CVA) due to embolism of cerebral artery ([place]) 08/06/2019   Intracranial atherosclerosis 08/06/2019   CKD (chronic kidney disease), stage IIIa 08/03/2019   Cerebellar infarct, right ([place]) - embolic, unknown source, s/p tPA 07/31/2019   Arthritis 10/21/2018   Sinusitis 10/21/2018    Diabetes mellitus type 2 in obese ([place]) 08/22/2017   Obesity (BMI 30-39.9) 08/22/2017   Herpes simplex type 1 infection 02/21/2015   Recurrent oral herpes simplex infection 02/21/2015   History of stroke 02/16/2015   Arteriosclerosis of coronary artery 10/07/2014   Heart attack ([place]) 10/07/2014   Hypotestosteronism 10/07/2014   B12 deficiency 12/18/2011   Hypothyroidism 05/04/2011   Annual physical exam 04/05/2011   Diabetes mellitus type 2, controlled, with complications ([place]) 89/38/1017   Essential hypertension 04/04/2011   Hyperlipidemia 04/04/2011   Hypogonadism male 04/04/2011   Alopecia 04/04/2011   Erectile dysfunction 04/04/2011    Past Surgical History:  Procedure Laterality Date   BUBBLE STUDY  08/03/2019   Procedure: BUBBLE STUDY;  Surgeon: Fay Records, MD;  Location: [place];  Service: Cardiovascular;;   CARDIAC CATHETERIZATION     LOOP RECORDER INSERTION N/A 08/03/2019   Procedure: LOOP RECORDER INSERTION;  Surgeon: Evans Lance, MD;  Location: [place] CV LAB;  Service: Cardiovascular;  Laterality: N/A;   TEE WITHOUT CARDIOVERSION N/A 08/03/2019   Procedure: TRANSESOPHAGEAL ECHOCARDIOGRAM (TEE);  Surgeon: Fay Records, MD;  Location: [place];  Service: Cardiovascular;  Laterality: N/A;    Prior to Admission medications   Medication Sig Start Date End Date Taking? Authorizing Provider  alprostadil (EDEX) 20 MCG injection 20 mcg by Intracavitary route as needed for erectile dysfunction. use no more than 3 times per week    [provider]  aspirin EC 81 MG tablet Take 1 tablet (81 mg total) by mouth daily. STOP TAKING after 21 days  and continue PLAVIX ALONE 08/04/19   Donzetta Starch, NP  atorvastatin (LIPITOR) 80 MG tablet TAKE 1 TABLET BY MOUTH DAILY AT 6 PM. 10/31/20 10/31/21  McLean-Scocuzza, Nino Glow, MD  blood glucose meter kit and supplies Dispense based on patient and insurance preference. Use up to bid daily as directed. (FOR ICD-10 E10.9, E11.9).  1 year supply of lancets and strips 08/06/19   McLean-Scocuzza, Nino Glow, MD  clomiPHENE (CLOMID) 50 MG tablet TAKE 1 TABLET BY MOUTH 3 DAYS A WEEK 12/01/19 01/30/21  Franchot Gallo, MD  clopidogrel (PLAVIX) 75 MG tablet TAKE 1 TABLET BY MOUTH ONCE DAILY 08/25/20     Continuous Blood Gluc Receiver (FREESTYLE LIBRE 14 DAY READER) DEVI 1 Device by Does not apply route in the morning and at bedtime. Patient not taking: Reported on 11/08/2020 08/06/19   McLean-Scocuzza, Nino Glow, MD  dapagliflozin propanediol (FARXIGA) 10 MG TABS tablet TAKE 1 TABLET (10 MG TOTAL) BY MOUTH EVERY MORNING 11/08/20 11/08/21  McLean-Scocuzza, Nino Glow, MD  ezetimibe (ZETIA) 10 MG tablet TAKE 1 TABLET (10 MG TOTAL) BY MOUTH DAILY. 08/08/20 08/08/21  McLean-Scocuzza, Nino Glow, MD  FREESTYLE LITE test strip 1 each 3 (three) times daily. 08/10/19   [provider]  glimepiride (AMARYL) 2 MG tablet TAKE 1 TABLET (2 MG TOTAL) BY MOUTH 2 (TWO) TIMES DAILY WITH A MEAL. 05/10/20 05/10/21  McLean-Scocuzza, Nino Glow, MD  Lancets (FREESTYLE) lancets 1 each 3 (three) times daily. 08/10/19   [provider]  levothyroxine (SYNTHROID) 75 MCG tablet Take 1 tablet (75 mcg total) by mouth daily. 11/16/20   Dutch Quint B, FNP  lisinopril (ZESTRIL) 40 MG tablet TAKE 1 TABLET (40 MG TOTAL) BY MOUTH DAILY. 05/10/20 05/10/21  McLean-Scocuzza, Nino Glow, MD  metFORMIN (GLUCOPHAGE) 850 MG tablet TAKE 1 TABLET BY MOUTH TWICE DAILY 11/08/20 11/08/21  McLean-Scocuzza, Nino Glow, MD  metoprolol succinate (TOPROL-XL) 50 MG 24 hr tablet TAKE 1 TABLET BY MOUTH DAILY. TAKE WITH OR IMMEDIATELY FOLLOWING A MEAL. 11/08/20 11/08/21  McLean-Scocuzza, Nino Glow, MD  omeprazole (PRILOSEC OTC) 20 MG tablet Take 20 mg by mouth daily as needed (acid reflux/heartburn).    [provider]  sildenafil (VIAGRA) 100 MG tablet Take 0.5-1 tablets (50-100 mg total) by mouth daily as needed for erectile dysfunction. Patient taking differently: Take 100 mg by mouth daily as needed for  erectile dysfunction. 11/10/12   Wendie Agreste, MD  sitaGLIPtin (JANUVIA) 100 MG tablet TAKE 1 TABLET (100 MG TOTAL) BY MOUTH DAILY. 05/10/20 05/10/21  McLean-Scocuzza, Nino Glow, MD  valACYclovir (VALTREX) 1000 MG tablet take 1 to 2 tablets by mouth every 12 hours for 2 to 3 days as needed for fever blisters 11/08/20   McLean-Scocuzza, Nino Glow, MD    Allergies Other and Pravachol [pravastatin sodium]  Family History  Problem Relation Age of Onset   Hypertension Father    Dementia Father        died age 34 in 06-27-17   Memory loss Father    Emphysema Mother    COPD Mother    Thyroid disease Mother    Cancer Neg Hx    Diabetes Neg Hx    Heart disease Neg Hx    Stroke Neg Hx     Social History Social History   Tobacco Use   Smoking status: Never   Smokeless tobacco: Never  Vaping Use   Vaping Use: Never used  Substance Use Topics   Alcohol use: No    Comment: former etoh  abuse in 1980s    Drug use: No    Review of Systems   Review of Systems  Unable to perform ROS: Mental status change  All other systems reviewed and are negative.  Physical Exam Updated Vital Signs BP 120/75   Pulse (!) 55   Temp 97.8 F (36.6 C)   Resp 16   Ht 6' (1.829 m)   Wt 111.2 kg   SpO2 98%   BMI 33.25 kg/m   Physical Exam Vitals and nursing note reviewed.  Constitutional:      General: He is in acute distress.     Comments: Patient appears critically ill, profusely diaphoretic  HENT:     Head: Normocephalic and atraumatic.  Eyes:     General: No scleral icterus.    Conjunctiva/sclera: Conjunctivae normal.  Cardiovascular:     Rate and Rhythm: Normal rate and regular rhythm.  Pulmonary:     Effort: Pulmonary effort is normal. No respiratory distress.     Breath sounds: Normal breath sounds. No wheezing.  Musculoskeletal:        General: No deformity or signs of injury.     Cervical back: Normal range of motion.  Skin:    Coloration: Skin is not jaundiced or pale.  Neurological:      Mental Status: He is alert.     Comments: Patient is unable to answer orientation questions, he is completely aphasic There is a left-sided facial droop, pupils are equal round and reactive bilaterally, there is a right gaze deviation Patient moves his right upper and lower extremities spontaneously, patient withdraws to pain on the left arm but not the left leg  Psychiatric:        Mood and Affect: Mood normal.        Behavior: Behavior normal.     LABS (all labs ordered are listed, but only abnormal results are displayed)  Labs Reviewed  CBC - Abnormal; Notable for the following components:      Result Value   WBC 10.7 (*)    All other components within normal limits  COMPREHENSIVE METABOLIC PANEL - Abnormal; Notable for the following components:   Glucose, Bld 188 (*)    Creatinine, Ser 1.34 (*)    Total Protein 6.4 (*)    All other components within normal limits  CBG MONITORING, ED - Abnormal; Notable for the following components:   Glucose-Capillary 167 (*)    All other components within normal limits  RESP PANEL BY RT-PCR (FLU A&B, COVID) ARPGX2  PROTIME-INR  APTT  DIFFERENTIAL  CBG MONITORING, ED   ____________________________________________  EKG  Sinus bradycardia, normal axis, normal intervals, low voltage, no acute ischemic changes ____________________________________________  RADIOLOGY I, Madelin Headings, personally viewed and evaluated these images (plain radiographs) as part of my medical decision making, as well as reviewing the written report by the radiologist.  ED MD interpretation: I reviewed the CT of the head which does not show any acute findings  Reviewed the CT angio of the head and neck which shows an acute M1 occlusion    ____________________________________________   PROCEDURES  Procedure(s) performed (including Critical Care):  .Critical Care Performed by: Rada Hay, MD Authorized by: Rada Hay, MD    Critical care provider statement:    Critical care time (minutes):  60   Critical care was necessary to treat or prevent imminent or life-threatening deterioration of the following conditions:  CNS failure or compromise   Critical care was time spent  personally by me on the following activities:  Development of treatment plan with patient or surrogate, discussions with consultants, examination of patient, ordering and review of radiographic studies, re-evaluation of patient's condition, pulse oximetry and review of old charts   I assumed direction of critical care for this patient from another provider in my specialty: no     Care discussed with: accepting provider at another facility     ____________________________________________   [place] / [place] / ED COURSE     60 year old male presenting with aphasia and left-sided weakness.  Upon triage evaluation nurse code stroke was called.  His last known normal was 10:30 AM he is outside the window for tPA.  On arrival he is Lucianne Lei positive with complete aphasia and paresis of the left arm and leg.  He was taken immediately to CAT scan which was negative for bleed.  He has an acute M1 occlusion.  Gust with neurologist at home.  Patient will be transported by EMS to [facility] immediately.      ____________________________________________   FINAL CLINICAL IMPRESSION(S) / ED DIAGNOSES  Final diagnoses:  Acute ischemic right MCA stroke [facility])     ED Discharge Orders     None        Note:  This document was prepared using Dragon voice recognition software and may include unintentional dictation errors.    Rada Hay, MD 12/17/20 Pecola Lawless    Rada Hay, MD 12/17/20 [phone]

## 2020-12-17 NOTE — Sedation Documentation (Signed)
Patient transported to [place] ICU with CRNA. Patient is alert, moving all extremities. Not verbal at this time but following some simple commands. Groin site intact. No hematoma noted. Soft to palpation. +2 distal pulses intact.

## 2020-12-17 NOTE — Evaluation (Signed)
Clinical/Bedside Swallow Evaluation Patient Details  Name: Karl Morgan MRN: [id number] Date of Birth: June 23, 1960  Today's Date: 12/17/2020 Time: SLP Start Time (ACUTE ONLY): [id number] SLP Stop Time (ACUTE ONLY): 1200 SLP Time Calculation (min) (ACUTE ONLY): 19 min  Past Medical History:  Past Medical History:  Diagnosis Date   Alopecia 04/04/2011   Diabetes mellitus    Erectile dysfunction 04/04/2011   Heart murmur    Hyperlipidemia 04/04/2011   Hypogonadism male 04/04/2011   Myocardial infarction [facility])    Stroke [facility])    Substance abuse ([place])    Type II or unspecified type diabetes mellitus without mention of complication, uncontrolled 04/04/2011   Unspecified essential hypertension 04/04/2011   Past Surgical History:  Past Surgical History:  Procedure Laterality Date   BUBBLE STUDY  08/03/2019   Procedure: BUBBLE STUDY;  Surgeon: Fay Records, MD;  Location: [place];  Service: Cardiovascular;;   CARDIAC CATHETERIZATION     LOOP RECORDER INSERTION N/A 08/03/2019   Procedure: LOOP RECORDER INSERTION;  Surgeon: Evans Lance, MD;  Location: [place] CV LAB;  Service: Cardiovascular;  Laterality: N/A;   TEE WITHOUT CARDIOVERSION N/A 08/03/2019   Procedure: TRANSESOPHAGEAL ECHOCARDIOGRAM (TEE);  Surgeon: Fay Records, MD;  Location: [place];  Service: Cardiovascular;  Laterality: N/A;   HPI:  Fall d/t L sided weakness. R MCA occlusion. History of two prior CVAs; all in R MCA. Wife reports minimal residual effects from prior CVAs (mild memory loss). No other events have resulted in aphasia. Pt presently with "word salad," fluently aphasic with good insight.    Assessment / Plan / Recommendation  Clinical Impression  [name] demonstrates greatly improved alertness compared to initial presentation this morning. He requested food/drink. Pt passed the Yale swallow protocol 3 oz challenge by consuming 3 oz consecutively without s/s aspiration. He had no difficulty  with regular solid  graham cracker, but did independently request liquid wash to clear. He appears appropriate to initiate oral diet of thin liquids and regular consistency solids; meds whole with liquid. No further ST needed for dysphagia; however, he does need a language evaluation, but is improving rapidly (nonverbal at 7 am, now with excessive verbal output (nonsensical).   Eval was attempted during this session, but pt presently is experiencing severe fluent aphasia with good insight and is very frustrated by this. Basic comprehension appeared good. Plan to hold formal evaluation until tomorrow based on rapid progression and frustration level. SLP Visit Diagnosis: Dysphagia, unspecified (R13.10)    Aspiration Risk  Mild aspiration risk    Diet Recommendation     Liquid Administration via: Straw Medication Administration: Whole meds with liquid Supervision: Patient able to self feed    Other  Recommendations Oral Care Recommendations: Oral care BID    Recommendations for follow up therapy are one component of a multi-disciplinary discharge planning process, led by the attending physician.  Recommendations may be updated based on patient status, additional functional criteria and insurance authorization.  Follow up Recommendations        Frequency and Duration min 1 x/week  1 week       Prognosis        Swallow Study   General Date of Onset: 12/17/20 HPI: Fall d/t L sided weakness. R MCA occlusion. History of two prior CVAs; all in R MCA. Wife reports minimal residual effects from prior CVAs (mild memory loss). No other events have resulted in aphasia. Pt presently with "word salad," fluently aphasic with good insight. Type  of Study: Bedside Swallow Evaluation Previous Swallow Assessment: unable to participate in [place] initially d/t lethargy Diet Prior to this Study: Regular;Thin liquids Temperature Spikes Noted: No Respiratory Status: Room air History of Recent Intubation:  No Behavior/Cognition: Alert;Cooperative;Pleasant mood Oral Cavity Assessment: Within Functional Limits Oral Care Completed by SLP: No Oral Cavity - Dentition: Adequate natural dentition Vision: Functional for self-feeding Patient Positioning: Upright in bed Baseline Vocal Quality: Normal Volitional Cough: Strong Volitional Swallow: Able to elicit    Oral/Motor/Sensory Function Overall Oral Motor/Sensory Function: Mild impairment Facial ROM: Reduced left Facial Symmetry: Abnormal symmetry left Facial Strength: Reduced left Facial Sensation: Within Functional Limits Lingual ROM: Within Functional Limits Lingual Symmetry: Within Functional Limits Lingual Strength: Within Functional Limits Lingual Sensation: Within Functional Limits Velum: Within Functional Limits Mandible: Within Functional Limits   Ice Chips Ice chips: Within functional limits Presentation: Spoon   Thin Liquid Thin Liquid: Within functional limits Presentation: Straw Other Comments: 3 oz challenge completed partially reclined    Nectar Thick Nectar Thick Liquid: Not tested   Honey Thick Honey Thick Liquid: Not tested   Puree Puree: Within functional limits Presentation: [place]. Nathan Moctezuma, M.S., CCC-SLP Speech-Language Pathologist Acute Rehabilitation Services Pager: [phone]  Solid: Within functional limits Presentation: [place] 12/17/2020,12:06 PM

## 2020-12-17 NOTE — ED Notes (Signed)
Pt brought to room via wheelchair, pale, diaphoretic, R sided gaze, no purposeful movement to L side, nonverbal. Provider at bedside, vital signs obtained and pt taken to ct

## 2020-12-18 ENCOUNTER — Inpatient Hospital Stay (HOSPITAL_COMMUNITY): Payer: 59

## 2020-12-18 DIAGNOSIS — I513 Intracardiac thrombosis, not elsewhere classified: Secondary | ICD-10-CM

## 2020-12-18 LAB — BASIC METABOLIC PANEL
Anion gap: 13 (ref 5–15)
BUN: 18 mg/dL (ref 8–23)
CO2: 21 mmol/L — ABNORMAL LOW (ref 22–32)
Calcium: 9.1 mg/dL (ref 8.9–10.3)
Chloride: 106 mmol/L (ref 98–111)
Creatinine, Ser: 1.34 mg/dL — ABNORMAL HIGH (ref 0.61–1.24)
GFR, Estimated: >60 mL/min (ref >60)
Glucose, Bld: 101 mg/dL — ABNORMAL HIGH (ref 70–99)
Potassium: 4.2 mmol/L (ref 3.5–5.1)
Sodium: 140 mmol/L (ref 135–145)

## 2020-12-18 LAB — LIPID PANEL
Cholesterol: 91 mg/dL (ref 0–200)
HDL: 27 mg/dL — ABNORMAL LOW (ref >40)
LDL Cholesterol: 49 mg/dL (ref 0–99)
Total CHOL/HDL Ratio: 3.4 RATIO
Triglycerides: 74 mg/dL (ref <150)
VLDL: 15 mg/dL (ref 0–40)

## 2020-12-18 LAB — GLUCOSE, CAPILLARY
Glucose-Capillary: 108 mg/dL — ABNORMAL HIGH (ref 70–99)
Glucose-Capillary: 110 mg/dL — ABNORMAL HIGH (ref 70–99)
Glucose-Capillary: 176 mg/dL — ABNORMAL HIGH (ref 70–99)
Glucose-Capillary: 213 mg/dL — ABNORMAL HIGH (ref 70–99)
Glucose-Capillary: 220 mg/dL — ABNORMAL HIGH (ref 70–99)

## 2020-12-18 LAB — HEMOGLOBIN A1C
Hgb A1c MFr Bld: 6.7 % — ABNORMAL HIGH (ref 4.8–5.6)
Mean Plasma Glucose: 145.59 mg/dL

## 2020-12-18 LAB — CBC
HCT: 44.1 % (ref 39.0–52.0)
Hemoglobin: 14.7 g/dL (ref 13.0–17.0)
MCH: 32.6 pg (ref 26.0–34.0)
MCHC: 33.3 g/dL (ref 30.0–36.0)
MCV: 97.8 fL (ref 80.0–100.0)
Platelets: 175 10*3/uL (ref 150–400)
RBC: 4.51 MIL/uL (ref 4.22–5.81)
RDW: 12 % (ref 11.5–15.5)
WBC: 13.6 10*3/uL — ABNORMAL HIGH (ref 4.0–10.5)
nRBC: 0 % (ref 0.0–0.2)

## 2020-12-18 LAB — HEPARIN LEVEL (UNFRACTIONATED)
Heparin Unfractionated: 0.47 IU/mL (ref 0.30–0.70)
Heparin Unfractionated: 0.65 IU/mL (ref 0.30–0.70)
Heparin Unfractionated: 0.61 IU/mL (ref 0.30–0.70)

## 2020-12-18 LAB — TSH: TSH: 1.183 u[IU]/mL (ref 0.350–4.500)

## 2020-12-18 LAB — T4, FREE: Free T4: 1.46 ng/dL — ABNORMAL HIGH (ref 0.61–1.12)

## 2020-12-18 LAB — HIV ANTIBODY (ROUTINE TESTING W REFLEX): HIV Screen 4th Generation wRfx: NONREACTIVE

## 2020-12-18 MED ORDER — GADOBUTROL 1 MMOL/ML IV SOLN
14.0000 mL | Freq: Once | INTRAVENOUS | Status: AC | PRN
  Administered 2020-12-18: 14 mL via INTRAVENOUS

## 2020-12-18 MED ORDER — INSULIN ASPART 100 UNIT/ML IJ SOLN
0.0000 [IU] | Freq: Three times a day (TID) | INTRAMUSCULAR | Status: DC
  Administered 2020-12-18: 5 [IU] via SUBCUTANEOUS
  Administered 2020-12-18: 3 [IU] via SUBCUTANEOUS
  Administered 2020-12-19: 2 [IU] via SUBCUTANEOUS
  Administered 2020-12-19: 5 [IU] via SUBCUTANEOUS
  Administered 2020-12-19: 3 [IU] via SUBCUTANEOUS
  Administered 2020-12-20 (×2): 2 [IU] via SUBCUTANEOUS

## 2020-12-18 MED ORDER — PANTOPRAZOLE SODIUM 40 MG PO TBEC
40.0000 mg | DELAYED_RELEASE_TABLET | Freq: Every day | ORAL | Status: DC
  Administered 2020-12-18 – 2020-12-20 (×3): 40 mg via ORAL
  Filled 2020-12-18 (×3): qty 1

## 2020-12-18 MED ORDER — CHLORHEXIDINE GLUCONATE CLOTH 2 % EX PADS
6.0000 | MEDICATED_PAD | Freq: Every day | CUTANEOUS | Status: DC
  Administered 2020-12-18 – 2020-12-19 (×2): 6 via TOPICAL

## 2020-12-18 MED ORDER — INSULIN ASPART 100 UNIT/ML IJ SOLN: 0.0000 [IU] | Freq: Three times a day (TID) | INTRAMUSCULAR | Status: DC

## 2020-12-18 MED ORDER — HYDRALAZINE HCL 20 MG/ML IJ SOLN: 5.0000 mg | INTRAMUSCULAR | Status: DC | PRN

## 2020-12-18 NOTE — Progress Notes (Signed)
[place] for heparin Indication:  LV Thrombus  Allergies  Allergen Reactions   Other Other (See Comments)    Rybelsus ab pain at 3 mg, 7 mg n/v   Pravachol [Pravastatin Sodium] Other (See Comments)    Muscle aches    Patient Measurements:   Heparin Dosing Weight: 101 kg   Vital Signs: Temp: 97.6 F (36.4 C) (09/18 1142) Temp Source: Oral (09/18 1142) BP: 129/79 (09/18 1200) Pulse Rate: 71 (09/18 1200)  Labs: Recent Labs    12/17/20 0506 12/18/20 0322 12/18/20 1144  HGB 14.6 14.7  --   HCT 42.6 44.1  --   PLT 185 175  --   APTT 26  --   --   LABPROT 12.6  --   --   INR 0.9  --   --   HEPARINUNFRC  --  0.47 0.65  CREATININE 1.34* 1.34*  --      Estimated Creatinine Clearance: 74.5 mL/min (A) (by C-G formula based on SCr of 1.34 mg/dL (H)).   Assessment: 47 YOM who presented as a code stroke found to have a R M1 occlusion s/p thrombectomy. MRI showed moderately large R MCA infarct with petechial hemorrhage. 2D ECHO showed an LV thrombus and pharmacy consulted to start IV heparin for treatment.  Give moderately large acute stroke and petechial hemorrhage, will start heparin very conservatively WITHOUT boluses aiming for a conservative anti-Xa range.   Heparin level up to 0.65, above reduced goal described above, on 1400 units/hr, no issues or bleeding observed  Goal of Therapy:  Heparin level 0.3-0.5 units/ml Monitor platelets by anticoagulation protocol: Yes   Plan:  Decrease heparin gtt to 1300 units/hr F/u 6 hour heparin level Daily heparin level, CBC, s/s bleeding  Bertis Ruddy, PharmD Clinical Pharmacist ED Pharmacist Phone # [phone] 12/18/2020 1:00 PM

## 2020-12-18 NOTE — Progress Notes (Signed)
STROKE TEAM PROGRESS NOTE   SUBJECTIVE (INTERVAL HISTORY) His wife and RN are at the bedside.  Overall his condition continues to improve.  MRI showed right MCA moderately large infarct with petechial hemorrhage. MRA showed patent right MCA.  However, 2D echo report reviewed and it showed nonmobile LV mural thrombus (approximately 1.2 x 0.5 cm) in region of akinesis involving the apical septal wall. His last TEE in 08/2019 showed no wall motion abnormalities.  Discussed with cardiology Dr. Juliane Lack and he was put on heparin IV for anticoagulation, cardiac MRI done this morning to confirm the LV thrombus. If it confirmed, cardiology consult will be requested.   OBJECTIVE Temp:  [97.6 F (36.4 C)-98.4 F (36.9 C)] 97.6 F (36.4 C) (09/18 1142) Pulse Rate:  [55-92] 71 (09/18 1200) Resp:  [11-21] 18 (09/18 1200) BP: (96-145)/(58-108) 129/79 (09/18 1200) SpO2:  [93 %-100 %] 98 % (09/18 1200)  Recent Labs  Lab 12/17/20 1953 12/17/20 2346 12/18/20 0341 12/18/20 0815 12/18/20 1147  GLUCAP 159* 105* 108* 110* 176*   Recent Labs  Lab 12/17/20 0506 12/18/20 0322  NA 138 140  K 3.9 4.2  CL 107 106  CO2 24 21*  GLUCOSE 188* 101*  BUN 19 18  CREATININE 1.34* 1.34*  CALCIUM 9.3 9.1   Recent Labs  Lab 12/17/20 0506  AST 25  ALT 24  ALKPHOS 50  BILITOT 0.5  PROT 6.4*  ALBUMIN 3.9   Recent Labs  Lab 12/17/20 0506 12/18/20 0322  WBC 10.7* 13.6*  NEUTROABS 6.6  --   HGB 14.6 14.7  HCT 42.6 44.1  MCV 93.8 97.8  PLT 185 175   No results for input(s): CKTOTAL, CKMB, CKMBINDEX, TROPONINI in the last 168 hours. Recent Labs    12/17/20 0506  LABPROT 12.6  INR 0.9   No results for input(s): COLORURINE, LABSPEC, PHURINE, GLUCOSEU, HGBUR, BILIRUBINUR, KETONESUR, PROTEINUR, UROBILINOGEN, NITRITE, LEUKOCYTESUR in the last 72 hours.  Invalid input(s): APPERANCEUR     Component Value Date/Time   CHOL 91 12/18/2020 0322   CHOL 109 09/22/2013 0908   TRIG 74 12/18/2020 0322   TRIG  99 09/22/2013 0908   HDL 27 (L) 12/18/2020 0322   HDL 25 (L) 09/22/2013 0908   CHOLHDL 3.4 12/18/2020 0322   VLDL 15 12/18/2020 0322   VLDL 20 09/22/2013 0908   LDLCALC 49 12/18/2020 0322   LDLCALC 71 08/22/2017 1005   LDLCALC 64 09/22/2013 0908   Lab Results  Component Value Date   HGBA1C 6.7 (H) 12/18/2020      Component Value Date/Time   LABOPIA NONE DETECTED 07/31/2019 2330   COCAINSCRNUR NONE DETECTED 07/31/2019 2330   LABBENZ NONE DETECTED 07/31/2019 2330   AMPHETMU NONE DETECTED 07/31/2019 2330   THCU NONE DETECTED 07/31/2019 2330   LABBARB NONE DETECTED 07/31/2019 2330    No results for input(s): ETH in the last 168 hours.  I have personally reviewed the radiological images below and agree with the radiology interpretations.  CT Angio Head W or Wo Contrast  Result Date: 12/17/2020 CLINICAL DATA:  Left-sided weakness EXAM: CT ANGIOGRAPHY HEAD AND NECK TECHNIQUE: Multidetector CT imaging of the head and neck was performed using the standard protocol during bolus administration of intravenous contrast. Multiplanar CT image reconstructions and MIPs were obtained to evaluate the vascular anatomy. Carotid stenosis measurements (when applicable) are obtained utilizing NASCET criteria, using the distal internal carotid diameter as the denominator. CONTRAST:  41m OMNIPAQUE IOHEXOL 350 MG/ML SOLN COMPARISON:  07/31/2019 FINDINGS: CTA NECK FINDINGS  Aortic arch: Standard branching. Imaged portion shows no evidence of aneurysm or dissection. No significant stenosis of the major arch vessel origins. Right carotid system: Mixed density plaque at the bifurcation without flow limiting stenosis or ulceration. Left carotid system: Mixed density plaque at the bifurcation without flow limiting stenosis or ulceration. Vertebral arteries: No proximal subclavian stenosis. Strong left vertebral artery dominance. Skeleton: Degenerative endplate and facet spurring. Other neck: Negative Upper chest:  Coronary calcification. Review of the MIP images confirms the above findings CTA HEAD FINDINGS Anterior circulation: Atheromatous calcification affecting the carotid siphons. Abrupt cut off at the distal right M1 segment with some downstream branch reconstitution. Negative for aneurysm Posterior circulation: Basilar is fed solely by the left vertebral artery. Fetal type bilateral PCA flow. No branch occlusion, beading, or aneurysm. Venous sinuses: Unremarkable for the arterial phase Anatomic variants: As above Review of the MIP images confirms the above findings Critical Value/emergent results were called by telephone at the time of interpretation on 12/17/2020 at 5:24 am to provider [facility] , who verbally acknowledged these results. IMPRESSION: 1. Emergent large vessel occlusion at the right M1 segment. 2. Nonobstructive atherosclerosis of the cervical and intracranial carotids. Electronically Signed   By: Jorje Guild M.D.   On: 12/17/2020 05:30   CT Angio Neck W and/or Wo Contrast  Result Date: 12/17/2020 CLINICAL DATA:  Left-sided weakness EXAM: CT ANGIOGRAPHY HEAD AND NECK TECHNIQUE: Multidetector CT imaging of the head and neck was performed using the standard protocol during bolus administration of intravenous contrast. Multiplanar CT image reconstructions and MIPs were obtained to evaluate the vascular anatomy. Carotid stenosis measurements (when applicable) are obtained utilizing NASCET criteria, using the distal internal carotid diameter as the denominator. CONTRAST:  46m OMNIPAQUE IOHEXOL 350 MG/ML SOLN COMPARISON:  07/31/2019 FINDINGS: CTA NECK FINDINGS Aortic arch: Standard branching. Imaged portion shows no evidence of aneurysm or dissection. No significant stenosis of the major arch vessel origins. Right carotid system: Mixed density plaque at the bifurcation without flow limiting stenosis or ulceration. Left carotid system: Mixed density plaque at the bifurcation without flow limiting  stenosis or ulceration. Vertebral arteries: No proximal subclavian stenosis. Strong left vertebral artery dominance. Skeleton: Degenerative endplate and facet spurring. Other neck: Negative Upper chest: Coronary calcification. Review of the MIP images confirms the above findings CTA HEAD FINDINGS Anterior circulation: Atheromatous calcification affecting the carotid siphons. Abrupt cut off at the distal right M1 segment with some downstream branch reconstitution. Negative for aneurysm Posterior circulation: Basilar is fed solely by the left vertebral artery. Fetal type bilateral PCA flow. No branch occlusion, beading, or aneurysm. Venous sinuses: Unremarkable for the arterial phase Anatomic variants: As above Review of the MIP images confirms the above findings Critical Value/emergent results were called by telephone at the time of interpretation on 12/17/2020 at 5:24 am to [facility], who verbally acknowledged these results. IMPRESSION: 1. Emergent large vessel occlusion at the right M1 segment. 2. Nonobstructive atherosclerosis of the cervical and intracranial carotids. Electronically Signed   By: JJorje GuildM.D.   On: 12/17/2020 05:30   MR ANGIO HEAD WO CONTRAST  Result Date: 12/17/2020 CLINICAL DATA:  Stroke follow-up. Status post thrombectomy for treatment of M1 occlusion. EXAM: MRI HEAD WITHOUT CONTRAST MRA HEAD WITHOUT CONTRAST TECHNIQUE: Multiplanar, multi-echo pulse sequences of the brain and surrounding structures were acquired without intravenous contrast. Angiographic images of the Circle of Willis were acquired using MRA technique without intravenous contrast. COMPARISON:  Head CT and CTA 12/17/2020.  Head MRI  08/01/2019. FINDINGS: MRI HEAD FINDINGS Brain: There is a small to moderate-sized acute right MCA infarct involving portions of the temporal, frontal, and parietal lobes and insula with associated petechial hemorrhage. A small chronic right frontal cortical infarct is again  noted, and there are chronic bilateral cerebellar infarcts with associated chronic blood products. The ventricles are normal in size. No mass, midline shift, or extra-axial fluid collection is identified. Vascular: More fully evaluated below. Skull and upper cervical spine: Unremarkable bone marrow signal. Sinuses/Orbits: Unremarkable orbits. Mild mucosal thickening in the paranasal sinuses. Small right and trace left mastoid effusions. Other: None. MRA HEAD FINDINGS Anterior circulation: The internal carotid arteries are widely patent from skull base to carotid termini. ACAs and MCAs are patent without evidence of a proximal branch occlusion or significant proximal stenosis. Specifically, the recanalized right M1 segment remains patent without an underlying stenosis. No aneurysm is identified. Posterior circulation: The included intracranial portion of the left vertebral artery is widely patent and supplies the basilar. The right vertebral artery is not visualized and was shown to be hypoplastic on the earlier CTA. The basilar artery is widely patent. There is a large right posterior communicating artery. 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 No significant proximal PCA stenosis is evident. No aneurysm is identified. Anatomic variants: As above. IMPRESSION: 1. Small to moderate-sized acute right MCA infarct. 2. Chronic right frontal and bilateral cerebellar infarcts. 3. Negative head MRA. Electronically Signed   By: Logan Bores M.D.   On: 12/17/2020 14:55   MR BRAIN WO CONTRAST  Result Date: 12/17/2020 CLINICAL DATA:  Stroke follow-up. Status post thrombectomy for treatment of M1 occlusion. EXAM: MRI HEAD WITHOUT CONTRAST MRA HEAD WITHOUT CONTRAST TECHNIQUE: Multiplanar, multi-echo pulse sequences of the brain and surrounding structures were acquired without intravenous  contrast. Angiographic images of the Circle of Willis were acquired using MRA technique without intravenous contrast. COMPARISON:  Head CT and CTA 12/17/2020.  Head MRI 08/01/2019. FINDINGS: MRI HEAD FINDINGS Brain: There is a small to moderate-sized acute right MCA infarct involving portions of the temporal, frontal, and parietal lobes and insula with associated petechial hemorrhage. A small chronic right frontal cortical infarct is again noted, and there are chronic bilateral cerebellar infarcts with associated chronic blood products. The ventricles are normal in size. No mass, midline shift, or extra-axial fluid collection is identified. Vascular: More fully evaluated below. Skull and upper cervical spine: Unremarkable bone marrow signal. Sinuses/Orbits: Unremarkable orbits. Mild mucosal thickening in the paranasal sinuses. Small right and trace left mastoid effusions. Other: None. MRA HEAD FINDINGS Anterior circulation: The internal carotid arteries are widely patent from skull base to carotid termini. ACAs and MCAs are patent without evidence of a proximal branch occlusion or significant proximal stenosis. Specifically, the recanalized right M1 segment remains patent without an underlying stenosis. No aneurysm is identified. Posterior circulation: The included intracranial portion of the left vertebral artery is widely patent and supplies the basilar. The right vertebral artery is not visualized and was shown to be hypoplastic on the earlier CTA. The basilar artery is widely patent. There is a large right posterior communicating artery. 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 No significant proximal PCA stenosis is evident. No aneurysm is identified. Anatomic variants: As above. IMPRESSION: 1.  Small to moderate-sized acute right MCA infarct. 2. Chronic right frontal and  bilateral cerebellar infarcts. 3. Negative head MRA. Electronically Signed   By: Logan Bores M.D.   On: 12/17/2020 14:55   ECHOCARDIOGRAM COMPLETE  Result Date: 12/17/2020    ECHOCARDIOGRAM REPORT   Patient Name:   AKING SHULMAN [place] Date of Exam: 12/17/2020 Medical Rec #:  [id number]      Height:       72.0 in Accession #:    [id number]     Weight:       245.1 lb Date of Birth:  [birth year]      BSA:          2.323 m Patient Age:    60 years       BP:           122/72 mmHg Patient Gender: M              HR:           72 bpm. Exam Location:  Inpatient Procedure: 2D Echo, Cardiac Doppler and Color Doppler Indications:    Stroke 434.91 / I163.9  History:        Patient has prior history of Echocardiogram examinations, most                 recent 08/03/2019. Previous Myocardial Infarction, Stroke, Mitral                 Valve Disease; Risk Factors:Hypertension, Diabetes and                 Dyslipidemia. Substance abuse ([place]) (From Hx).  Sonographer:    Leavy Cella RDCS Sonographer#2:  Alvino Chapel RCS Referring Phys: [phone] MCNEILL P KIRKPATRICK IMPRESSIONS  1. Definity contrast utilized. There is a nonmobile LV mural thrombus (approximately 1.2 x 0.5 cm) in region of akinesis involving the apical septal wall. Reported to covering team at 5:10 PM.  2. Left ventricular ejection fraction, by estimation, is 60 to 65%. The left ventricle has normal function. The left ventricle demonstrates regional wall motion abnormalities (see scoring diagram/findings for description). There is mild left ventricular  hypertrophy. Left ventricular diastolic parameters are indeterminate.  3. Right ventricular systolic function is normal. The right ventricular size is normal. Tricuspid regurgitation signal is inadequate for assessing PA pressure.  4. The mitral valve is grossly normal. Trivial mitral valve regurgitation.  5. The aortic valve is tricuspid. Aortic valve regurgitation is not visualized.  6. The inferior vena cava is normal in  size with greater than 50% respiratory variability, suggesting right atrial pressure of 3 mmHg. Comparison(s): Prior images reviewed side by side. LV mural thrombus newly identified. Prior study from May 2021 did have similar apical septal wall motion abnormality. FINDINGS  Left Ventricle: Left ventricular ejection fraction, by estimation, is 60 to 65%. The left ventricle has normal function. The left ventricle demonstrates regional wall motion abnormalities. Definity contrast agent was given IV to delineate the left ventricular endocardial borders. The left ventricular internal cavity size was normal in size. There is mild left ventricular hypertrophy. Left ventricular diastolic parameters are indeterminate.  LV Wall Scoring: The apical septal segment and apex are akinetic. The apical anterior segment is hypokinetic. The anterior wall, entire lateral wall, anterior septum, entire inferior wall, mid inferoseptal segment, and basal inferoseptal segment are normal. Right Ventricle: The right ventricular size is normal. No increase in right ventricular wall thickness. Right ventricular systolic function is normal. Tricuspid regurgitation signal is inadequate  for assessing PA pressure. Left Atrium: Left atrial size was normal in size. Right Atrium: Right atrial size was normal in size. Pericardium: There is no evidence of pericardial effusion. Presence of pericardial fat pad. Mitral Valve: The mitral valve is grossly normal. Trivial mitral valve regurgitation. Tricuspid Valve: The tricuspid valve is grossly normal. Tricuspid valve regurgitation is trivial. Aortic Valve: The aortic valve is tricuspid. There is mild aortic valve annular calcification. Aortic valve regurgitation is not visualized. Pulmonic Valve: The pulmonic valve was not well visualized. Pulmonic valve regurgitation is not visualized. Aorta: The aortic root is normal in size and structure. Venous: The inferior vena cava is normal in size with greater  than 50% respiratory variability, suggesting right atrial pressure of 3 mmHg. IAS/Shunts: No atrial level shunt detected by color flow Doppler.  LEFT VENTRICLE PLAX 2D LVIDd:         5.40 cm  Diastology LVIDs:         3.50 cm  LV e' medial:    6.85 cm/s LV PW:         1.10 cm  LV E/e' medial:  8.9 LV IVS:        1.10 cm  LV e' lateral:   7.83 cm/s LVOT diam:     2.00 cm  LV E/e' lateral: 7.8 LVOT Area:     3.14 cm  RIGHT VENTRICLE RV S prime:     15.90 cm/s TAPSE (M-mode): 2.4 cm LEFT ATRIUM             Index       RIGHT ATRIUM          Index LA diam:        3.50 cm 1.51 cm/m  RA Area:     9.85 cm LA Vol (A2C):   48.8 ml 21.01 ml/m RA Volume:   21.30 ml 9.17 ml/m LA Vol (A4C):   18.1 ml 7.79 ml/m LA Biplane Vol: 30.1 ml 12.96 ml/m   AORTA Ao Root diam: 2.80 cm MITRAL VALVE MV Area (PHT): 3.08 cm    SHUNTS MV Decel Time: 246 msec    Systemic Diam: 2.00 cm MV E velocity: 60.80 cm/s MV A velocity: 78.60 cm/s MV E/A ratio:  0.77 Rozann Lesches MD Electronically signed by Rozann Lesches MD Signature Date/Time: 12/17/2020/5:19:12 PM    Final    CUP PACEART REMOTE DEVICE CHECK  Result Date: 12/14/2020 ILR summary report received. Battery status OK. Normal device function. No new symptom, tachy, brady, or pause episodes. No new AF episodes. Monthly summary reports and ROV/PRN  CT HEAD CODE STROKE WO CONTRAST  Result Date: 12/17/2020 CLINICAL DATA:  Code stroke.  Left-sided weakness EXAM: CT HEAD WITHOUT CONTRAST TECHNIQUE: Contiguous axial images were obtained from the base of the skull through the vertex without intravenous contrast. COMPARISON:  08/02/2019 FINDINGS: Brain: Small chronic high right frontal cortex infarct. No convincing acute infarct. No acute hemorrhage. Remote bilateral cerebellar infarcts. No hydrocephalus or mass. Vascular: Hyperdense right M1 segment Skull: Negative Sinuses/Orbits: Negative Other: Critical Value/emergent results were called by telephone at the time of interpretation on  12/17/2020 at 5:24 am to provider [facility] , who verbally acknowledged these results. ASPECTS [facility] Stroke Program Early CT Score) - Ganglionic level infarction (caudate, lentiform nuclei, internal capsule, insula, M1-M3 cortex): 7 - Supraganglionic infarction (M4-M6 cortex): 3 when accounting for chronic cortex infarct Total score (0-10 with 10 being normal): 10 IMPRESSION: 1. Hyperdense right M1 segment, reference CTA. 2. No acute hemorrhage or  visible acute infarct. 3. Remote right frontal cortex and bilateral cerebellar infarcts. Electronically Signed   By: Jorje Guild M.D.   On: 12/17/2020 05:25     PHYSICAL EXAM  Temp:  [97.6 F (36.4 C)-98.4 F (36.9 C)] 97.6 F (36.4 C) (09/18 1142) Pulse Rate:  [55-92] 71 (09/18 1200) Resp:  [11-21] 18 (09/18 1200) BP: (96-145)/(58-108) 129/79 (09/18 1200) SpO2:  [93 %-100 %] 98 % (09/18 1200)  General - Well nourished, well developed, in no apparent distress.  Ophthalmologic - fundi not visualized due to noncooperation.  Cardiovascular - Regular rhythm and rate.  Neuro - awake, alert, eyes open, orientated to age and people. Expressive aphasia improving and able to say short sentences but still has word salad and paraphasic errors, following all simple commands. No gaze palsy, tracking bilaterally, visual field full, PERRL. Mild right facial droop. Tongue midline. Bilateral UEs 5/5, no drift. Bilaterally LEs 5/5, no drift. Sensation symmetrical bilaterally, b/l FTN intact, gait not tested.    ASSESSMENT/PLAN Mr. Jacinto F [place] is a 60 y.o. left-handed male with history of stroke, diabetes, hypertension, hyperlipidemia, CAD/MI, CKD admitted for left-sided weakness and aphasia. No tPA given due to outside window.    Stroke:  right MCA infarct due to right M1 occlusion status post IR with TICI2c revascularization, embolic pattern, secondary to LV thrombus Resultant expressive aphasia CT head no acute abnormality, old right frontal and  bilateral cerebellar infarcts.  Hyperdense right MCA sign. CTA head neck right M1 occlusion S/p IR with TICI2c revascularization of right M1 MRI right MCA moderately large infarct with petechial hemorrhage MRA right M1 patent 2D Echo nonmobile LV mural thrombus (approximately 1.2 x 0.5 cm) in region of akinesis involving the apical septal wall.  LDL 49 HgbA1c 6.7 Heparin IV for VTE prophylaxis aspirin 81 mg daily and clopidogrel 75 mg daily prior to admission, now on heparin IV due to LV thrombus Patient counseled to be compliant with his antithrombotic medications Ongoing aggressive stroke risk factor management Therapy recommendations: Pending Disposition: Pending  LV thrombus TTE showed nonmobile LV mural thrombus (approximately 1.2 x 0.5 cm) in region of akinesis involving the apical septal wall.  Discussed with cardiology on-call Dr. Juliane Lack, recommend cardiac MRI to confirm LV thrombus Cardiac MRI pending Continue heparin IV Loop recorder so far no A. fib 08/2019 TEE showed EF 50 to 55%, no regional wall motion abnormalities, no PFO If LV thrombus confirmed, will required official cardiology consult.  History of stroke 02/2015, left arm weakness, difficulty walking, MRI showed right motor strip small infarct.  EF 60 to 65%, MRA head and neck right VA occlusion.  A1c 7.2, LDL 57, discharged with DAPT 08/2019 admitted for right superior cerebellum infarct with hemorrhagic transformation.  CT head and neck right VA chronic occlusion.  EF 65 to 70%.  TEE showed EF 50 to 55%, no regional wall motion abnormalities, no PFO.  Loop recorder placed.  LDL 75, A1c 8.7, discharged with DAPT and Lipitor. Followed with Dr. Leonie Man and Janett Billow NP at [facility], loop recorder negative for A. fib.  Diabetes HgbA1c 6.7, goal < 7.0 controlled CBG monitoring SSI DM education and close PCP follow up  Hypertension Stable Long term BP goal normotensive  Hyperlipidemia Home meds: Lipitor 80 and Zetia  10 LDL 49, goal < 70 Now on Lipitor 80 and Zetia 10 Continue statin at discharge  Other Stroke Risk Factors Obesity, BMI 33.25 Coronary artery disease/MI  Other Active Problems CKD IIIA, creatinine 1.34->1.34  Hospital day #  1  This patient is critically ill due to large right MCA infarct with right M1 occlusion status post thrombectomy, LV thrombus, history of embolic strokes and at significant risk of neurological worsening, death form recurrent stroke, hemorrhagic conversion, heart failure, systemic embolism, seizure. This patient's care requires constant monitoring of vital signs, hemodynamics, respiratory and cardiac monitoring, review of multiple databases, neurological assessment, discussion with family, other specialists and medical decision making of high complexity. I spent 40 minutes of neurocritical care time in the care of this patient.  Rosalin Hawking, MD PhD Stroke Neurology 12/18/2020 1:38 PM    To contact Stroke Continuity provider, please refer to [email or web address] After hours, contact General Neurology

## 2020-12-18 NOTE — Progress Notes (Signed)
[place] for heparin Indication:  LV Thrombus  Allergies  Allergen Reactions   Other Other (See Comments)    Rybelsus ab pain at 3 mg, 7 mg n/v   Pravachol [Pravastatin Sodium] Other (See Comments)    Muscle aches    Patient Measurements:   Heparin Dosing Weight: 101 kg   Vital Signs: Temp: 98.7 F (37.1 C) (09/18 1600) Temp Source: Oral (09/18 1600) BP: 121/77 (09/18 1600) Pulse Rate: 71 (09/18 1600)  Labs: Recent Labs    12/17/20 0506 12/18/20 0322 12/18/20 1144  HGB 14.6 14.7  --   HCT 42.6 44.1  --   PLT 185 175  --   APTT 26  --   --   LABPROT 12.6  --   --   INR 0.9  --   --   HEPARINUNFRC  --  0.47 0.65  CREATININE 1.34* 1.34*  --      Estimated Creatinine Clearance: 74.5 mL/min (A) (by C-G formula based on SCr of 1.34 mg/dL (H)).   Assessment: 29 YOM who presented as a code stroke found to have a R M1 occlusion s/p thrombectomy. MRI showed moderately large R MCA infarct with petechial hemorrhage. 2D ECHO showed an LV thrombus and pharmacy consulted to start IV heparin for treatment.  Give moderately large acute stroke and petechial hemorrhage, will start heparin very conservatively WITHOUT boluses aiming for a conservative anti-Xa range.   Heparin level this evening is SUPRAtherapeutic (HL 0.61 << 0.65, goal of 0.3-0.5). Confirmed drawn from opposite arm. No bleeding or infusion issues noted per discussion with RN.   Goal of Therapy:  Heparin level 0.3-0.5 units/ml Monitor platelets by anticoagulation protocol: Yes   Plan:  - Decrease Heparin to 1150 units/hr (11.5 ml/hr) - Will continue to monitor for any signs/symptoms of bleeding and will follow up with heparin level in 6 hours   Thank you for allowing pharmacy to be a part of this patient's care.  Alycia Rossetti, PharmD, BCPS Clinical Pharmacist Clinical phone for 12/18/2020: [phone] 12/18/2020 7:26 PM   **Pharmacist phone directory can now be found on  [email or web address] ([email or web address]).  Listed under [place].

## 2020-12-18 NOTE — Progress Notes (Signed)
[place] for heparin Indication:  LV Thrombus  Allergies  Allergen Reactions   Other     Rybelsus ab pain at 3 mg, 7 mg n/v   Pravachol [Pravastatin Sodium] Other (See Comments)    Muscle aches    Patient Measurements:   Heparin Dosing Weight: 101 kg   Vital Signs: Temp: 98.4 F (36.9 C) (09/18 0340) Temp Source: Oral (09/18 0340) BP: 100/69 (09/18 0400) Pulse Rate: 64 (09/18 0400)  Labs: Recent Labs    12/17/20 0506 12/18/20 0322  HGB 14.6 14.7  HCT 42.6 44.1  PLT 185 175  APTT 26  --   LABPROT 12.6  --   INR 0.9  --   HEPARINUNFRC  --  0.47  CREATININE 1.34* 1.34*     Estimated Creatinine Clearance: 74.5 mL/min (A) (by C-G formula based on SCr of 1.34 mg/dL (H)).   Assessment: 17 YOM who presented as a code stroke found to have a R M1 occlusion s/p thrombectomy. MRI showed moderately large R MCA infarct with petechial hemorrhage. 2D ECHO showed an LV thrombus and pharmacy consulted to start IV heparin for treatment.  Give moderately large acute stroke and petechial hemorrhage, will start heparin very conservatively WITHOUT boluses aiming for a conservative anti-Xa range.   Heparin level therapeutic (0.47) on gtt at 1400 units/hr. No bleeding noted.  Goal of Therapy:  Heparin level 0.3-0.5 units/ml Monitor platelets by anticoagulation protocol: Yes   Plan:  Continue IV heparin at 1400 units/hr F/u 6 hr confirmatory heparin level  Sherlon Handing, PharmD, BCPS Please see amion for complete clinical pharmacist phone list 12/18/2020 4:30 AM

## 2020-12-18 NOTE — Progress Notes (Signed)
Referring Physician(s): Code stroke  Supervising Physician: Corrie Mckusick  Patient Status:  [facility] - In-pt  Chief Complaint: Follow up right M1 thrombectomy 9/17 in NIR  Subjective:  Patient sitting up in bed, wife and RN at bedside - he is getting ready to go down for MRI. He reports continued expressive aphasia, "I know what I want to say but it doesn't come out right sometimes" which he feels is improving. He has a mild headache that is better with rest, he has not tried any pain relievers and does not want to try anything right now as the headache is very tolerable. He has no problems moving his arms/legs and does not feel weak/numb. He is wondering if he can go to a regular bed soon as the monitors in the ICU are cumbersome. His wife is wondering what the plan will be for the clot in his heart.  Allergies: Other and Pravachol [pravastatin sodium]  Medications: Prior to Admission medications   Medication Sig Start Date End Date Taking? Authorizing Provider  alprostadil (EDEX) 20 MCG injection 20 mcg by Intracavitary route as needed for erectile dysfunction. use no more than 3 times per week    [provider]  aspirin EC 81 MG tablet Take 1 tablet (81 mg total) by mouth daily. STOP TAKING after 21 days and continue PLAVIX ALONE 08/04/19   Donzetta Starch, NP  atorvastatin (LIPITOR) 80 MG tablet TAKE 1 TABLET BY MOUTH DAILY AT 6 PM. Patient taking differently: Take 80 mg by mouth daily. 10/31/20 10/31/21  McLean-Scocuzza, Nino Glow, MD  blood glucose meter kit and supplies Dispense based on patient and insurance preference. Use up to bid daily as directed. (FOR ICD-10 E10.9, E11.9). 1 year supply of lancets and strips 08/06/19   McLean-Scocuzza, Nino Glow, MD  clomiPHENE (CLOMID) 50 MG tablet TAKE 1 TABLET BY MOUTH 3 DAYS A WEEK Patient taking differently: Take 50 mg by mouth 3 (three) times a week. 12/01/19 01/30/21  Franchot Gallo, MD  clopidogrel (PLAVIX) 75 MG tablet TAKE 1 TABLET  BY MOUTH ONCE DAILY Patient taking differently: Take 75 mg by mouth daily. 08/25/20     Continuous Blood Gluc Receiver (FREESTYLE LIBRE 14 DAY READER) DEVI 1 Device by Does not apply route in the morning and at bedtime. Patient not taking: No sig reported 08/06/19   McLean-Scocuzza, Nino Glow, MD  dapagliflozin propanediol (FARXIGA) 10 MG TABS tablet TAKE 1 TABLET (10 MG TOTAL) BY MOUTH EVERY MORNING Patient taking differently: Take 10 mg by mouth daily. 11/08/20 11/08/21  McLean-Scocuzza, Nino Glow, MD  ezetimibe (ZETIA) 10 MG tablet TAKE 1 TABLET (10 MG TOTAL) BY MOUTH DAILY. Patient taking differently: Take 10 mg by mouth daily. 08/08/20 08/08/21  McLean-Scocuzza, Nino Glow, MD  FREESTYLE LITE test strip 1 each 3 (three) times daily. 08/10/19   [provider]  glimepiride (AMARYL) 2 MG tablet TAKE 1 TABLET (2 MG TOTAL) BY MOUTH 2 (TWO) TIMES DAILY WITH A MEAL. Patient taking differently: Take 2 mg by mouth in the morning and at bedtime. 05/10/20 05/10/21  McLean-Scocuzza, Nino Glow, MD  Lancets (FREESTYLE) lancets 1 each 3 (three) times daily. 08/10/19   [provider]  levothyroxine (SYNTHROID) 75 MCG tablet Take 1 tablet (75 mcg total) by mouth daily. 11/16/20   Dutch Quint B, FNP  lisinopril (ZESTRIL) 40 MG tablet TAKE 1 TABLET (40 MG TOTAL) BY MOUTH DAILY. Patient taking differently: Take 40 mg by mouth daily. 05/10/20 05/10/21  McLean-Scocuzza, Nino Glow,  MD  metFORMIN (GLUCOPHAGE) 850 MG tablet TAKE 1 TABLET BY MOUTH TWICE DAILY Patient taking differently: Take 850 mg by mouth 2 (two) times daily. 11/08/20 11/08/21  McLean-Scocuzza, Nino Glow, MD  metoprolol succinate (TOPROL-XL) 50 MG 24 hr tablet TAKE 1 TABLET BY MOUTH DAILY. TAKE WITH OR IMMEDIATELY FOLLOWING A MEAL. Patient taking differently: Take 50 mg by mouth daily. 11/08/20 11/08/21  McLean-Scocuzza, Nino Glow, MD  omeprazole (PRILOSEC OTC) 20 MG tablet Take 20 mg by mouth daily as needed (acid reflux/heartburn).    [provider]   sildenafil (VIAGRA) 100 MG tablet Take 0.5-1 tablets (50-100 mg total) by mouth daily as needed for erectile dysfunction. Patient taking differently: Take 100 mg by mouth daily as needed for erectile dysfunction. 11/10/12   Wendie Agreste, MD  sitaGLIPtin (JANUVIA) 100 MG tablet TAKE 1 TABLET (100 MG TOTAL) BY MOUTH DAILY. 05/10/20 05/10/21  McLean-Scocuzza, Nino Glow, MD  valACYclovir (VALTREX) 1000 MG tablet take 1 to 2 tablets by mouth every 12 hours for 2 to 3 days as needed for fever blisters Patient taking differently: Take 500-1,000 mg by mouth 2 (two) times daily as needed (fever blisters). 11/08/20   McLean-Scocuzza, Nino Glow, MD     Vital Signs: BP 101/70   Pulse 69   Temp 97.7 F (36.5 C) (Oral)   Resp 16   SpO2 99%   Physical Exam Vitals and nursing note reviewed.  Constitutional:      General: He is not in acute distress. HENT:     Head: Normocephalic.  Cardiovascular:     Rate and Rhythm: Normal rate and regular rhythm.     Comments: (+) Right CFA puncture site minimally tender to deep palpation, non pulsatile, no erythema/edema/bleeding/drainage. Pulmonary:     Effort: Pulmonary effort is normal.     Breath sounds: Normal breath sounds.  Abdominal:     Palpations: Abdomen is soft.  Skin:    General: Skin is warm and dry.  Neurological:     Mental Status: He is alert.  Alert, awake, and oriented x 3 Speech and comprehension in tact - he has some expressive aphasia and at times using the incorrect word but clearly realizes this is not the word he is intending to use.  PERRL bialterally EOMs without nystagmus or subjective diplopia. Visual fields grossly intact No obvious facial asymmetry. Tongue midline Motor power full in all 4 extremities Fine motor and coordination in tact  Imaging: CT Angio Head W or Wo Contrast  Result Date: 12/17/2020 CLINICAL DATA:  Left-sided weakness EXAM: CT ANGIOGRAPHY HEAD AND NECK TECHNIQUE: Multidetector CT imaging of the head and  neck was performed using the standard protocol during bolus administration of intravenous contrast. Multiplanar CT image reconstructions and MIPs were obtained to evaluate the vascular anatomy. Carotid stenosis measurements (when applicable) are obtained utilizing NASCET criteria, using the distal internal carotid diameter as the denominator. CONTRAST:  23m OMNIPAQUE IOHEXOL 350 MG/ML SOLN COMPARISON:  07/31/2019 FINDINGS: CTA NECK FINDINGS Aortic arch: Standard branching. Imaged portion shows no evidence of aneurysm or dissection. No significant stenosis of the major arch vessel origins. Right carotid system: Mixed density plaque at the bifurcation without flow limiting stenosis or ulceration. Left carotid system: Mixed density plaque at the bifurcation without flow limiting stenosis or ulceration. Vertebral arteries: No proximal subclavian stenosis. Strong left vertebral artery dominance. Skeleton: Degenerative endplate and facet spurring. Other neck: Negative Upper chest: Coronary calcification. Review of the MIP images confirms the above findings CTA HEAD FINDINGS  Anterior circulation: Atheromatous calcification affecting the carotid siphons. Abrupt cut off at the distal right M1 segment with some downstream branch reconstitution. Negative for aneurysm Posterior circulation: Basilar is fed solely by the left vertebral artery. Fetal type bilateral PCA flow. No branch occlusion, beading, or aneurysm. Venous sinuses: Unremarkable for the arterial phase Anatomic variants: As above Review of the MIP images confirms the above findings Critical Value/emergent results were called by telephone at the time of interpretation on 12/17/2020 at 5:24 am to provider [facility] , who verbally acknowledged these results. IMPRESSION: 1. Emergent large vessel occlusion at the right M1 segment. 2. Nonobstructive atherosclerosis of the cervical and intracranial carotids. Electronically Signed   By: Jorje Guild M.D.   On:  12/17/2020 05:30   CT Angio Neck W and/or Wo Contrast  Result Date: 12/17/2020 CLINICAL DATA:  Left-sided weakness EXAM: CT ANGIOGRAPHY HEAD AND NECK TECHNIQUE: Multidetector CT imaging of the head and neck was performed using the standard protocol during bolus administration of intravenous contrast. Multiplanar CT image reconstructions and MIPs were obtained to evaluate the vascular anatomy. Carotid stenosis measurements (when applicable) are obtained utilizing NASCET criteria, using the distal internal carotid diameter as the denominator. CONTRAST:  60m OMNIPAQUE IOHEXOL 350 MG/ML SOLN COMPARISON:  07/31/2019 FINDINGS: CTA NECK FINDINGS Aortic arch: Standard branching. Imaged portion shows no evidence of aneurysm or dissection. No significant stenosis of the major arch vessel origins. Right carotid system: Mixed density plaque at the bifurcation without flow limiting stenosis or ulceration. Left carotid system: Mixed density plaque at the bifurcation without flow limiting stenosis or ulceration. Vertebral arteries: No proximal subclavian stenosis. Strong left vertebral artery dominance. Skeleton: Degenerative endplate and facet spurring. Other neck: Negative Upper chest: Coronary calcification. Review of the MIP images confirms the above findings CTA HEAD FINDINGS Anterior circulation: Atheromatous calcification affecting the carotid siphons. Abrupt cut off at the distal right M1 segment with some downstream branch reconstitution. Negative for aneurysm Posterior circulation: Basilar is fed solely by the left vertebral artery. Fetal type bilateral PCA flow. No branch occlusion, beading, or aneurysm. Venous sinuses: Unremarkable for the arterial phase Anatomic variants: As above Review of the MIP images confirms the above findings Critical Value/emergent results were called by telephone at the time of interpretation on 12/17/2020 at 5:24 am [facility], who verbally acknowledged these results.  IMPRESSION: 1. Emergent large vessel occlusion at the right M1 segment. 2. Nonobstructive atherosclerosis of the cervical and intracranial carotids. Electronically Signed   By: JJorje GuildM.D.   On: 12/17/2020 05:30   MR ANGIO HEAD WO CONTRAST  Result Date: 12/17/2020 CLINICAL DATA:  Stroke follow-up. Status post thrombectomy for treatment of M1 occlusion. EXAM: MRI HEAD WITHOUT CONTRAST MRA HEAD WITHOUT CONTRAST TECHNIQUE: Multiplanar, multi-echo pulse sequences of the brain and surrounding structures were acquired without intravenous contrast. Angiographic images of the Circle of Willis were acquired using MRA technique without intravenous contrast. COMPARISON:  Head CT and CTA 12/17/2020.  Head MRI 08/01/2019. FINDINGS: MRI HEAD FINDINGS Brain: There is a small to moderate-sized acute right MCA infarct involving portions of the temporal, frontal, and parietal lobes and insula with associated petechial hemorrhage. A small chronic right frontal cortical infarct is again noted, and there are chronic bilateral cerebellar infarcts with associated chronic blood products. The ventricles are normal in size. No mass, midline shift, or extra-axial fluid collection is identified. Vascular: More fully evaluated below. Skull and upper cervical spine: Unremarkable bone marrow signal. Sinuses/Orbits: Unremarkable orbits. Mild mucosal  thickening in the paranasal sinuses. Small right and trace left mastoid effusions. Other: None. MRA HEAD FINDINGS Anterior circulation: The internal carotid arteries are widely patent from skull base to carotid termini. ACAs and MCAs are patent without evidence of a proximal branch occlusion or significant proximal stenosis. Specifically, the recanalized right M1 segment remains patent without an underlying stenosis. No aneurysm is identified. Posterior circulation: The included intracranial portion of the left vertebral artery is widely patent and supplies the basilar. The right vertebral  artery is not visualized and was shown to be hypoplastic on the earlier CTA. The basilar artery is widely patent. There is a large right posterior communicating artery. 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 No significant proximal PCA stenosis is evident. No aneurysm is identified. Anatomic variants: As above. IMPRESSION: 1. Small to moderate-sized acute right MCA infarct. 2. Chronic right frontal and bilateral cerebellar infarcts. 3. Negative head MRA. Electronically Signed   By: Logan Bores M.D.   On: 12/17/2020 14:55   MR BRAIN WO CONTRAST  Result Date: 12/17/2020 CLINICAL DATA:  Stroke follow-up. Status post thrombectomy for treatment of M1 occlusion. EXAM: MRI HEAD WITHOUT CONTRAST MRA HEAD WITHOUT CONTRAST TECHNIQUE: Multiplanar, multi-echo pulse sequences of the brain and surrounding structures were acquired without intravenous contrast. Angiographic images of the Circle of Willis were acquired using MRA technique without intravenous contrast. COMPARISON:  Head CT and CTA 12/17/2020.  Head MRI 08/01/2019. FINDINGS: MRI HEAD FINDINGS Brain: There is a small to moderate-sized acute right MCA infarct involving portions of the temporal, frontal, and parietal lobes and insula with associated petechial hemorrhage. A small chronic right frontal cortical infarct is again noted, and there are chronic bilateral cerebellar infarcts with associated chronic blood products. The ventricles are normal in size. No mass, midline shift, or extra-axial fluid collection is identified. Vascular: More fully evaluated below. Skull and upper cervical spine: Unremarkable bone marrow signal. Sinuses/Orbits: Unremarkable orbits. Mild mucosal thickening in the paranasal sinuses. Small right and trace left mastoid effusions. Other: None. MRA HEAD FINDINGS Anterior circulation: The internal  carotid arteries are widely patent from skull base to carotid termini. ACAs and MCAs are patent without evidence of a proximal branch occlusion or significant proximal stenosis. Specifically, the recanalized right M1 segment remains patent without an underlying stenosis. No aneurysm is identified. Posterior circulation: The included intracranial portion of the left vertebral artery is widely patent and supplies the basilar. The right vertebral artery is not visualized and was shown to be hypoplastic on the earlier CTA. The basilar artery is widely patent. There is a large right posterior communicating artery. 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 No significant proximal PCA stenosis is evident. No aneurysm is identified. Anatomic variants: As above. IMPRESSION: 1. Small to moderate-sized acute right MCA infarct. 2. Chronic right frontal and bilateral cerebellar infarcts. 3. Negative head MRA. Electronically Signed   By: Logan Bores M.D.   On: 12/17/2020 14:55   ECHOCARDIOGRAM COMPLETE  Result Date: 12/17/2020    ECHOCARDIOGRAM REPORT   Patient Name:   Karl Morgan Date of Exam: 12/17/2020 Medical Rec #:  [id number]      Height:       72.0 in Accession #:    [id number]  Weight:       245.1 lb Date of Birth:  1960-04-13      BSA:          2.323 m Patient Age:    60 years       BP:           122/72 mmHg Patient Gender: M              HR:           72 bpm. Exam Location:  Inpatient Procedure: 2D Echo, Cardiac Doppler and Color Doppler Indications:    Stroke 434.91 / I163.9  History:        Patient has prior history of Echocardiogram examinations, most                 recent 08/03/2019. Previous Myocardial Infarction, Stroke, Mitral                 Valve Disease; Risk Factors:Hypertension, Diabetes and                 Dyslipidemia. Substance abuse ([place]) (From Hx).   Sonographer:    Leavy Cella RDCS Sonographer#2:  Alvino Chapel RCS Referring Phys: [id number] MCNEILL P KIRKPATRICK IMPRESSIONS  1. Definity contrast utilized. There is a nonmobile LV mural thrombus (approximately 1.2 x 0.5 cm) in region of akinesis involving the apical septal wall. Reported to covering team at 5:10 PM.  2. Left ventricular ejection fraction, by estimation, is 60 to 65%. The left ventricle has normal function. The left ventricle demonstrates regional wall motion abnormalities (see scoring diagram/findings for description). There is mild left ventricular  hypertrophy. Left ventricular diastolic parameters are indeterminate.  3. Right ventricular systolic function is normal. The right ventricular size is normal. Tricuspid regurgitation signal is inadequate for assessing PA pressure.  4. The mitral valve is grossly normal. Trivial mitral valve regurgitation.  5. The aortic valve is tricuspid. Aortic valve regurgitation is not visualized.  6. The inferior vena cava is normal in size with greater than 50% respiratory variability, suggesting right atrial pressure of 3 mmHg. Comparison(s): Prior images reviewed side by side. LV mural thrombus newly identified. Prior study from May 2021 did have similar apical septal wall motion abnormality. FINDINGS  Left Ventricle: Left ventricular ejection fraction, by estimation, is 60 to 65%. The left ventricle has normal function. The left ventricle demonstrates regional wall motion abnormalities. Definity contrast agent was given IV to delineate the left ventricular endocardial borders. The left ventricular internal cavity size was normal in size. There is mild left ventricular hypertrophy. Left ventricular diastolic parameters are indeterminate.  LV Wall Scoring: The apical septal segment and apex are akinetic. The apical anterior segment is hypokinetic. The anterior wall, entire lateral wall, anterior septum, entire inferior wall, mid inferoseptal segment, and basal  inferoseptal segment are normal. Right Ventricle: The right ventricular size is normal. No increase in right ventricular wall thickness. Right ventricular systolic function is normal. Tricuspid regurgitation signal is inadequate for assessing PA pressure. Left Atrium: Left atrial size was normal in size. Right Atrium: Right atrial size was normal in size. Pericardium: There is no evidence of pericardial effusion. Presence of pericardial fat pad. Mitral Valve: The mitral valve is grossly normal. Trivial mitral valve regurgitation. Tricuspid Valve: The tricuspid valve is grossly normal. Tricuspid valve regurgitation is trivial. Aortic Valve: The aortic valve is tricuspid. There is mild aortic valve annular calcification. Aortic valve regurgitation is not visualized. Pulmonic Valve: The pulmonic valve was not well  visualized. Pulmonic valve regurgitation is not visualized. Aorta: The aortic root is normal in size and structure. Venous: The inferior vena cava is normal in size with greater than 50% respiratory variability, suggesting right atrial pressure of 3 mmHg. IAS/Shunts: No atrial level shunt detected by color flow Doppler.  LEFT VENTRICLE PLAX 2D LVIDd:         5.40 cm  Diastology LVIDs:         3.50 cm  LV e' medial:    6.85 cm/s LV PW:         1.10 cm  LV E/e' medial:  8.9 LV IVS:        1.10 cm  LV e' lateral:   7.83 cm/s LVOT diam:     2.00 cm  LV E/e' lateral: 7.8 LVOT Area:     3.14 cm  RIGHT VENTRICLE RV S prime:     15.90 cm/s TAPSE (M-mode): 2.4 cm LEFT ATRIUM             Index       RIGHT ATRIUM          Index LA diam:        3.50 cm 1.51 cm/m  RA Area:     9.85 cm LA Vol (A2C):   48.8 ml 21.01 ml/m RA Volume:   21.30 ml 9.17 ml/m LA Vol (A4C):   18.1 ml 7.79 ml/m LA Biplane Vol: 30.1 ml 12.96 ml/m   AORTA Ao Root diam: 2.80 cm MITRAL VALVE MV Area (PHT): 3.08 cm    SHUNTS MV Decel Time: 246 msec    Systemic Diam: 2.00 cm MV E velocity: 60.80 cm/s MV A velocity: 78.60 cm/s MV E/A ratio:  0.77  Rozann Lesches MD Electronically signed by Rozann Lesches MD Signature Date/Time: 12/17/2020/5:19:12 PM    Final    CT HEAD CODE STROKE WO CONTRAST  Result Date: 12/17/2020 CLINICAL DATA:  Code stroke.  Left-sided weakness EXAM: CT HEAD WITHOUT CONTRAST TECHNIQUE: Contiguous axial images were obtained from the base of the skull through the vertex without intravenous contrast. COMPARISON:  08/02/2019 FINDINGS: Brain: Small chronic high right frontal cortex infarct. No convincing acute infarct. No acute hemorrhage. Remote bilateral cerebellar infarcts. No hydrocephalus or mass. Vascular: Hyperdense right M1 segment Skull: Negative Sinuses/Orbits: Negative Other: Critical Value/emergent results were called by telephone at the time of interpretation on 12/17/2020 at 5:24 am to provider [facility] , who verbally acknowledged these results. ASPECTS [facility] Stroke Program Early CT Score) - Ganglionic level infarction (caudate, lentiform nuclei, internal capsule, insula, M1-M3 cortex): 7 - Supraganglionic infarction (M4-M6 cortex): 3 when accounting for chronic cortex infarct Total score (0-10 with 10 being normal): 10 IMPRESSION: 1. Hyperdense right M1 segment, reference CTA. 2. No acute hemorrhage or visible acute infarct. 3. Remote right frontal cortex and bilateral cerebellar infarcts. Electronically Signed   By: Jorje Guild M.D.   On: 12/17/2020 05:25    Labs:  CBC: Recent Labs    11/16/20 0821 12/17/20 0506 12/18/20 0322  WBC 9.1 10.7* 13.6*  HGB 15.4 14.6 14.7  HCT 44.9 42.6 44.1  PLT 204 185 175    COAGS: Recent Labs    12/17/20 0506  INR 0.9  APTT 26    BMP: Recent Labs    11/16/20 0821 12/17/20 0506 12/18/20 0322  NA 137 138 140  K 4.7 3.9 4.2  CL 105 107 106  CO2 21* 24 21*  GLUCOSE 144* 188* 101*  BUN 20 19 18  CALCIUM 9.5 9.3 9.1  CREATININE 1.35* 1.34* 1.34*  GFRNONAA >60 >60 >60    LIVER FUNCTION TESTS: Recent Labs    11/16/20 0821 12/17/20 0506   BILITOT 0.6 0.5  AST 26 25  ALT 25 24  ALKPHOS 59 50  PROT 6.8 6.4*  ALBUMIN 4.3 3.9    Assessment and Plan:  60 y/o M who [facility]17 with left sided weakness, found to have right M1 occlusion s/p mechanical thrombectomy in NIR with post procedure parietal SAH seen today for follow up.  Patient reports doing very well overall, has a very mild headache that improves with rest - he is not interested in any pain medications right now, he has some expressive aphasia which he feels is improving. Right CFA puncture site is unremarkable, non pulsatile, soft.   Per chart review 2D echo with perflutren performed yesterday showed nomobile V mural thormbus (approximately 1.2 x 0.5 cm) in the region of akinesis involving the apical septal wall. He is currently on IV heparin per primary team.  Plan: - Routine wound care of right CFA puncture site until healed - Further plans per neurology/primary team  IR remains available, please call with questions or concerns.  Electronically Signed: Joaquim Nam, PA-C 12/18/2020, 10:17 AM   I spent a total of 25 Minutes at the the patient's bedside AND on the patient's hospital floor or unit, greater than 50% of which was counseling/coordinating care for M1 thrombectomy follow up.

## 2020-12-18 NOTE — Progress Notes (Signed)
OT Cancellation Note  Patient Details Name: Karl Morgan [place] MRN: [id number] DOB: 07/26/1960   Cancelled Treatment:    Reason Eval/Treat Not Completed: Active bedrest order;Patient not medically ready. Noted pt with LV thrombus and started on heparin 1854 9/17. Will hold until has been on heparin x 24 hours and plan to see 9/19  [facility] 12/18/2020, 7:51 AM  Jesse Sans OTR/L Acute Rehabilitation Services Pager: [phone] Office: [phone]

## 2020-12-18 NOTE — Progress Notes (Signed)
PT Cancellation Note  Patient Details Name: Bejamin F [place] MRN: [id number] DOB: 1960/06/17   Cancelled Treatment:    Reason Eval/Treat Not Completed: Patient not medically ready  Noted pt with LV thrombus and started on heparin 1854 9/17. Will hold until has been on heparin x 24 hours and plan to see 9/19.   Arby Barrette, PT Pager [phone]  Rexanne Mano 12/18/2020, 7:49 AM

## 2020-12-19 DIAGNOSIS — I6319 Cerebral infarction due to embolism of other precerebral artery: Secondary | ICD-10-CM

## 2020-12-19 DIAGNOSIS — E7800 Pure hypercholesterolemia, unspecified: Secondary | ICD-10-CM

## 2020-12-19 DIAGNOSIS — I513 Intracardiac thrombosis, not elsewhere classified: Secondary | ICD-10-CM

## 2020-12-19 LAB — CBC
HCT: 39.4 % (ref 39.0–52.0)
Hemoglobin: 13.4 g/dL (ref 13.0–17.0)
MCH: 32.6 pg (ref 26.0–34.0)
MCHC: 34 g/dL (ref 30.0–36.0)
MCV: 95.9 fL (ref 80.0–100.0)
Platelets: 145 10*3/uL — ABNORMAL LOW (ref 150–400)
RBC: 4.11 MIL/uL — ABNORMAL LOW (ref 4.22–5.81)
RDW: 12 % (ref 11.5–15.5)
WBC: 10.7 10*3/uL — ABNORMAL HIGH (ref 4.0–10.5)
nRBC: 0 % (ref 0.0–0.2)

## 2020-12-19 LAB — BASIC METABOLIC PANEL
Anion gap: 8 (ref 5–15)
BUN: 20 mg/dL (ref 8–23)
CO2: 23 mmol/L (ref 22–32)
Calcium: 9 mg/dL (ref 8.9–10.3)
Chloride: 108 mmol/L (ref 98–111)
Creatinine, Ser: 1.28 mg/dL — ABNORMAL HIGH (ref 0.61–1.24)
GFR, Estimated: >60 mL/min (ref >60)
Glucose, Bld: 138 mg/dL — ABNORMAL HIGH (ref 70–99)
Potassium: 3.8 mmol/L (ref 3.5–5.1)
Sodium: 139 mmol/L (ref 135–145)

## 2020-12-19 LAB — GLUCOSE, CAPILLARY
Glucose-Capillary: 149 mg/dL — ABNORMAL HIGH (ref 70–99)
Glucose-Capillary: 231 mg/dL — ABNORMAL HIGH (ref 70–99)
Glucose-Capillary: 157 mg/dL — ABNORMAL HIGH (ref 70–99)
Glucose-Capillary: 218 mg/dL — ABNORMAL HIGH (ref 70–99)

## 2020-12-19 LAB — HEPARIN LEVEL (UNFRACTIONATED): Heparin Unfractionated: 0.62 IU/mL (ref 0.30–0.70)

## 2020-12-19 MED ORDER — WARFARIN SODIUM 5 MG PO TABS
5.0000 mg | ORAL_TABLET | Freq: Once | ORAL | Status: AC
  Administered 2020-12-19: 5 mg via ORAL
  Filled 2020-12-19: qty 1

## 2020-12-19 MED ORDER — INSULIN ASPART 100 UNIT/ML IJ SOLN
0.0000 [IU] | Freq: Every day | INTRAMUSCULAR | Status: DC
Start: 2020-12-19 — End: 2020-12-20
  Administered 2020-12-19: 2 [IU] via SUBCUTANEOUS

## 2020-12-19 MED ORDER — ENOXAPARIN (LOVENOX) PATIENT EDUCATION KIT
PACK | Freq: Once | Status: AC
  Filled 2020-12-19: qty 1

## 2020-12-19 MED ORDER — COUMADIN BOOK
Freq: Once | Status: AC
  Filled 2020-12-19 (×2): qty 1

## 2020-12-19 MED ORDER — ENOXAPARIN SODIUM 120 MG/0.8ML IJ SOSY
110.0000 mg | PREFILLED_SYRINGE | Freq: Two times a day (BID) | INTRAMUSCULAR | Status: DC
  Administered 2020-12-19 – 2020-12-20 (×2): 110 mg via SUBCUTANEOUS
  Filled 2020-12-19 (×4): qty 0.74

## 2020-12-19 MED ORDER — ASPIRIN 81 MG PO CHEW
81.0000 mg | CHEWABLE_TABLET | Freq: Every day | ORAL | Status: DC
  Administered 2020-12-20: 81 mg via ORAL
  Filled 2020-12-19: qty 1

## 2020-12-19 MED ORDER — WARFARIN - PHARMACIST DOSING INPATIENT: Freq: Every day | Status: DC

## 2020-12-19 NOTE — Progress Notes (Signed)
[place] for warfarin/enox bridge Indication:  LV Thrombus  Allergies  Allergen Reactions   Other Other (See Comments)    Rybelsus ab pain at 3 mg, 7 mg n/v   Pravachol [Pravastatin Sodium] Other (See Comments)    Muscle aches    Patient Measurements:   Heparin Dosing Weight: 101 kg   Vital Signs: Temp: 98.2 F (36.8 C) (09/19 1200) Temp Source: Oral (09/19 1200) BP: 147/78 (09/19 0800) Pulse Rate: 65 (09/19 0800)  Labs: Recent Labs    12/17/20 0506 12/17/20 0506 12/18/20 0322 12/18/20 1144 12/18/20 2052 12/19/20 0623  HGB 14.6  --  14.7  --   --  13.4  HCT 42.6  --  44.1  --   --  39.4  PLT 185  --  175  --   --  145*  APTT 26  --   --   --   --   --   LABPROT 12.6  --   --   --   --   --   INR 0.9  --   --   --   --   --   HEPARINUNFRC  --    < > 0.47 0.65 0.61 0.62  CREATININE 1.34*  --  1.34*  --   --  1.28*   < > = values in this interval not displayed.     Estimated Creatinine Clearance: 78 mL/min (A) (by C-G formula based on SCr of 1.28 mg/dL (H)).   Assessment: 39 YOM who presented as a code stroke found to have a R M1 occlusion s/p thrombectomy. MRI showed moderately large R MCA infarct with petechial hemorrhage. 2D ECHO showed an LV thrombus and pharmacy consulted to start IV heparin for treatment.  Give moderately large acute stroke and petechial hemorrhage, will start heparin very conservatively WITHOUT boluses aiming for a conservative anti-Xa range.   Heparin transitioned to warfarin with enoxaparin bridge in preparation for discharge on 9/19  9/17 INR 0.9  Goal of Therapy:  INR Goal: 2-3   Plan:  Based on patient specific factors and recent M1 occlusion, will start warfarin 5mg  x1  Plan to also transition heparin to enoxaparin (1mg /kg q12h) bridge this evening Continue to follow daily INR, CBC, and renal function Patient will need education prior to discharge  Donnald Garre, PharmD Clinical  Pharmacist  Please check AMION for all [place] numbers After 10:00 PM, call [place]

## 2020-12-19 NOTE — Consult Note (Signed)
Cardiology Consultation:   Patient ID: Ryszard F San Marino MRN: [id number]; DOB: 1960-12-17  Admit date: 12/17/2020 Date of Consult: 12/19/2020  PCP:  McLean-Scocuzza, Nino Glow, MD   [facility] Providers Cardiologist:  None       Sees Dr. Vida Roller for cardiology.  Patient Profile:   Rollo F San Marino is a 60 y.o. male with a hx of CVA, CAD with prior STEMI and PCI stents, DM-2, HTN, HLD, GERD who is being seen 12/19/2020 for the evaluation of LV thrombus at the request of Dr. Erlinda Hong.  History of Present Illness:   Mr. San Marino with prior hx of cryptogenic CVA, CAD with STEMI and stents X 2 to LAD mid and distal with last cath 2015 but not in chart.   Hx of TEE 08/03/19 for CVA and results without masses and valves normal.  Perfusion stress test with no significant reversible ischemia and EF 45-50% done by Dr. Vida Roller 10/12/20 with exercise to 11 min.   Echo at that time with normal EF and G1DD.   He had loop recorder inserted 08/2019.  Pt was on ASA and plavix as outpt.   On 12/17/20 presented for CODE STROKE after Lt weakness and fall at 4:15 AM, walked into ER but in ER he decompensated developing severe Lt sided hemiparesis. Neuro saw pt and CT of brain no no acute findings.   CTA of brain with emergent large vessel occlusion at the right M1 segment.  Nonobstructive atherosclerosis of the cervical and intracranial Carotids.  MRA of brain Small to moderate-sized acute right MCA infarct, Chronic right frontal and bilateral cerebellar infarcts.  Pt did undergo IR with TICI2c revascularization of Rt M1.  On MRI right MCA moderately large infarct with petechial hemorrhage   Loop eval 12/03/20 with normal device function no a fib.  In continued work up Echo with non-mobile LV mural thrombus, 1.2 X 0.5 cm in region of akinesis involving the apical septal wall.  Not present in 2021 or 09/2020.  EF 60-65%, mild LVH, the apical septal segment and apex are akinetic. The apical anterior segment is hypokinetic. The  anterior wall, entire lateral wall, anterior septum, entire inferior wall, mid inferoseptal segment, and basal inferoseptal segment are normal.   Pt placed on IV heparin and cardiac MRI has been done but results pending.    Pt is now able to speak, still has difficulty finding words, at times.  Moving extremities.  Na 139, K+ 3.8, BUN 20, Cr 1.28  WBC 10.7, Hgb 13.4, plts 145.  TSH 4.997  free T4, 1.46  Lipids HDL 27, LDL 49, tchol 91  TG 74 A1c 6.7  EKG:  The EKG was personally reviewed and demonstrates:  SR at 72 no acute changes, and follow up SB at 55 with low voltage.  Telemetry:  Telemetry was personally reviewed and demonstrates:  SR  BP  147/78 P 60s afebrile  Denies any recent chest pain.   Past Medical History:  Diagnosis Date   Alopecia 04/04/2011   Diabetes mellitus    Erectile dysfunction 04/04/2011   Heart murmur    Hyperlipidemia 04/04/2011   Hypogonadism male 04/04/2011   Myocardial infarction [facility])    Stroke [facility])    Substance abuse ([place])    Type II or unspecified type diabetes mellitus without mention of complication, uncontrolled 04/04/2011   Unspecified essential hypertension 04/04/2011    Past Surgical History:  Procedure Laterality Date   BUBBLE STUDY  08/03/2019   Procedure: BUBBLE STUDY;  Surgeon: Dorris Carnes  V, MD;  Location: [facility] ENDOSCOPY;  Service: Cardiovascular;;   CARDIAC CATHETERIZATION     IR ANGIO INTRA EXTRACRAN SEL COM CAROTID INNOMINATE UNI L MOD SED  12/17/2020   IR CT HEAD LTD  12/17/2020   IR PERCUTANEOUS ART THROMBECTOMY/INFUSION INTRACRANIAL INC DIAG ANGIO  12/17/2020   IR US GUIDE VASC ACCESS RIGHT  12/17/2020   LOOP RECORDER INSERTION N/A 08/03/2019   Procedure: LOOP RECORDER INSERTION;  Surgeon: Evans Lance, MD;  Location: [facility];  Service: Cardiovascular;  Laterality: N/A;   TEE WITHOUT CARDIOVERSION N/A 08/03/2019   Procedure: TRANSESOPHAGEAL ECHOCARDIOGRAM (TEE);  Surgeon: Fay Records, MD;  Location: [facility] ENDOSCOPY;  Service:  Cardiovascular;  Laterality: N/A;     Home Medications:  Prior to Admission medications   Medication Sig Start Date End Date Taking? Authorizing Provider  alprostadil (EDEX) 20 MCG injection 20 mcg by Intracavitary route once as needed for erectile dysfunction. Use no more than 3 times per week   Yes [provider]  aspirin EC 81 MG tablet Take 1 tablet (81 mg total) by mouth daily. STOP TAKING after 21 days and continue PLAVIX ALONE Patient taking differently: Take 81 mg by mouth daily. 08/04/19  Yes Donzetta Starch, NP  atorvastatin (LIPITOR) 80 MG tablet TAKE 1 TABLET BY MOUTH DAILY AT 6 PM. Patient taking differently: Take 80 mg by mouth daily. 10/31/20 10/31/21 Yes McLean-Scocuzza, Nino Glow, MD  clomiPHENE (CLOMID) 50 MG tablet TAKE 1 TABLET BY MOUTH 3 DAYS A WEEK Patient taking differently: Take 50 mg by mouth 3 (three) times a week. 12/01/19 01/30/21 Yes Dahlstedt, Annie Main, MD  clopidogrel (PLAVIX) 75 MG tablet TAKE 1 TABLET BY MOUTH ONCE DAILY Patient taking differently: Take 75 mg by mouth daily. 08/25/20  Yes   dapagliflozin propanediol (FARXIGA) 10 MG TABS tablet TAKE 1 TABLET (10 MG TOTAL) BY MOUTH EVERY MORNING Patient taking differently: Take 10 mg by mouth daily. 11/08/20 11/08/21 Yes McLean-Scocuzza, Nino Glow, MD  ezetimibe (ZETIA) 10 MG tablet TAKE 1 TABLET (10 MG TOTAL) BY MOUTH DAILY. Patient taking differently: Take 10 mg by mouth daily. 08/08/20 08/08/21 Yes McLean-Scocuzza, Nino Glow, MD  glimepiride (AMARYL) 2 MG tablet TAKE 1 TABLET (2 MG TOTAL) BY MOUTH 2 (TWO) TIMES DAILY WITH A MEAL. Patient taking differently: Take 2 mg by mouth in the morning and at bedtime. 05/10/20 05/10/21 Yes McLean-Scocuzza, Nino Glow, MD  levothyroxine (SYNTHROID) 75 MCG tablet Take 1 tablet (75 mcg total) by mouth daily. 11/16/20  Yes Dutch Quint B, FNP  lisinopril (ZESTRIL) 40 MG tablet TAKE 1 TABLET (40 MG TOTAL) BY MOUTH DAILY. Patient taking differently: Take 40 mg by mouth daily. 05/10/20 05/10/21 Yes  McLean-Scocuzza, Nino Glow, MD  metFORMIN (GLUCOPHAGE) 850 MG tablet TAKE 1 TABLET BY MOUTH TWICE DAILY Patient taking differently: Take 850 mg by mouth 2 (two) times daily. 11/08/20 11/08/21 Yes McLean-Scocuzza, Nino Glow, MD  metoprolol succinate (TOPROL-XL) 50 MG 24 hr tablet TAKE 1 TABLET BY MOUTH DAILY. TAKE WITH OR IMMEDIATELY FOLLOWING A MEAL. Patient taking differently: Take 50 mg by mouth daily. 11/08/20 11/08/21 Yes McLean-Scocuzza, Nino Glow, MD  omeprazole (PRILOSEC OTC) 20 MG tablet Take 20 mg by mouth daily as needed (acid reflux/heartburn).   Yes [provider]  sildenafil (VIAGRA) 100 MG tablet Take 0.5-1 tablets (50-100 mg total) by mouth daily as needed for erectile dysfunction. Patient taking differently: Take 100 mg by mouth daily as needed for erectile dysfunction. 11/10/12  Yes Wendie Agreste,  MD  sitaGLIPtin (JANUVIA) 100 MG tablet TAKE 1 TABLET (100 MG TOTAL) BY MOUTH DAILY. Patient taking differently: Take 100 mg by mouth daily. 05/10/20 05/10/21 Yes McLean-Scocuzza, Nino Glow, MD  valACYclovir (VALTREX) 1000 MG tablet take 1 to 2 tablets by mouth every 12 hours for 2 to 3 days as needed for fever blisters Patient taking differently: Take 500-1,000 mg by mouth 2 (two) times daily as needed (fever blisters). 11/08/20  Yes McLean-Scocuzza, Nino Glow, MD  blood glucose meter kit and supplies Dispense based on patient and insurance preference. Use up to bid daily as directed. (FOR ICD-10 E10.9, E11.9). 1 year supply of lancets and strips 08/06/19   McLean-Scocuzza, Nino Glow, MD  Continuous Blood Gluc Receiver (FREESTYLE LIBRE 14 DAY READER) DEVI 1 Device by Does not apply route in the morning and at bedtime. Patient not taking: No sig reported 08/06/19   McLean-Scocuzza, Nino Glow, MD  FREESTYLE LITE test strip 1 each 3 (three) times daily. 08/10/19   [provider]  Lancets (FREESTYLE) lancets 1 each 3 (three) times daily. 08/10/19   [provider]    Inpatient  Medications: Scheduled Meds:  atorvastatin  80 mg Oral Daily   Chlorhexidine Gluconate Cloth  6 each Topical Daily   dapagliflozin propanediol  10 mg Oral Daily   ezetimibe  10 mg Oral Daily   insulin aspart  0-15 Units Subcutaneous TID WC   levothyroxine  75 mcg Oral Daily   pantoprazole  40 mg Oral Daily   Continuous Infusions:  heparin 1,000 Units/hr (12/19/20 0954)   PRN Meds: acetaminophen **OR** acetaminophen (TYLENOL) oral liquid 160 mg/5 mL **OR** acetaminophen, hydrALAZINE, iohexol  Allergies:    Allergies  Allergen Reactions   Other Other (See Comments)    Rybelsus ab pain at 3 mg, 7 mg n/v   Pravachol [Pravastatin Sodium] Other (See Comments)    Muscle aches    Social History:   Social History   Socioeconomic History   Marital status: Married    Spouse name: Not on file   Number of children: Not on file   Years of education: Not on file   Highest education level: Not on file  Occupational History   Occupation: OPERATIONS MANAGER of maintenance    Employer: [employer]  Tobacco Use   Smoking status: Never   Smokeless tobacco: Never  Vaping Use   Vaping Use: Never used  Substance and Sexual Activity   Alcohol use: No    Comment: former etoh abuse in 1980s    Drug use: No   Sexual activity: Yes  Other Topics Concern   Not on file  Social History Narrative   Caffienated drinks-no   Seat belt use often-yes   Regular Exercise-no   Smoke alarm in the home-yes   Firearms/guns in the home-yes   History of physical abuse-no      Married 2 kids daughter and son    Oldest of siblings has 2 brothers and 1 sister    Never smoker    Works Physicist, medical and [place]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       DPR wife Vickie             Social Determinants of Radio broadcast assistant Strain: Not on file  Food Insecurity: Not on file  Transportation Needs: Not on file  Physical Activity: Not on file  Stress:  Not on file  Social Connections: Not  on file  Intimate Partner Violence: Not on file    Family History:    Family History  Problem Relation Age of Onset   Hypertension Father    Dementia Father        died age 60 in Jun 19, 2017   Memory loss Father    Emphysema Mother    COPD Mother    Thyroid disease Mother    Cancer Neg Hx    Diabetes Neg Hx    Heart disease Neg Hx    Stroke Neg Hx      ROS:  Please see the history of present illness.  General:no colds or fevers, no weight changes Skin:no rashes or ulcers HEENT:no blurred vision, no congestion CV:see HPI PUL:see HPI GI:no diarrhea constipation or melena, no indigestion GU:no hematuria, no dysuria MS:no joint pain, no claudication Neuro:no syncope, no lightheadedness, + CVA Endo:+ diabetes, no thyroid disease  All other ROS reviewed and negative.     Physical Exam/Data:   Vitals:   12/19/20 0600 12/19/20 0700 12/19/20 0800 12/19/20 0900  BP:   (!) 147/78   Pulse:   65   Resp: '16 17 13 ' (!) 21  Temp:   98.8 F (37.1 C)   TempSrc:   Oral   SpO2:   99%     Intake/Output Summary (Last 24 hours) at 12/19/2020 1119 Last data filed at 12/18/2020 1800 Gross per 24 hour  Intake 89.75 ml  Output --  Net 89.75 ml   Last 3 Weights 12/17/2020 11/08/2020 05/10/2020  Weight (lbs) 245 lb 2.4 oz 240 lb 239 lb 12.8 oz  Weight (kg) 111.2 kg 108.863 kg 108.773 kg  Some encounter information is confidential and restricted. Go to Review Flowsheets activity to see all data.     There is no height or weight on file to calculate BMI.  General:  Well nourished, well developed, in no acute distress HEENT: normal Neck: no JVD Vascular: No carotid bruits; Distal pulses 2+ bilaterally Cardiac:  normal S1, S2; RRR; no murmur  Lungs:  clear to auscultation bilaterally, no wheezing, rhonchi or rales  Abd: soft, nontender, no hepatomegaly  Ext: no edema Musculoskeletal:  No deformities, BUE and BLE strength normal and equal Skin: warm and dry   Neuro:  alert and oriented X 3 MAE answers questions with some difficulty, at times has to word search.   Psych:  Normal affect    Relevant CV Studies: 12/17/20 Echo  IMPRESSIONS     1. Definity contrast utilized. There is a nonmobile LV mural thrombus  (approximately 1.2 x 0.5 cm) in region of akinesis involving the apical  septal wall. Reported to covering team at 5:10 PM.   2. Left ventricular ejection fraction, by estimation, is 60 to 65%. The  left ventricle has normal function. The left ventricle demonstrates  regional wall motion abnormalities (see scoring diagram/findings for  description). There is mild left ventricular   hypertrophy. Left ventricular diastolic parameters are indeterminate.   3. Right ventricular systolic function is normal. The right ventricular  size is normal. Tricuspid regurgitation signal is inadequate for assessing  PA pressure.   4. The mitral valve is grossly normal. Trivial mitral valve  regurgitation.   5. The aortic valve is tricuspid. Aortic valve regurgitation is not  visualized.   6. The inferior vena cava is normal in size with greater than 50%  respiratory variability, suggesting right atrial pressure of 3 mmHg.   Comparison(s): Prior images reviewed side by side. LV mural thrombus  newly  identified. Prior study from May 2021 did have similar apical septal wall  motion abnormality.   FINDINGS   Left Ventricle: Left ventricular ejection fraction, by estimation, is 60  to 65%. The left ventricle has normal function. The left ventricle  demonstrates regional wall motion abnormalities. Definity contrast agent  was given IV to delineate the left  ventricular endocardial borders. The left ventricular internal cavity size  was normal in size. There is mild left ventricular hypertrophy. Left  ventricular diastolic parameters are indeterminate.      LV Wall Scoring:  The apical septal segment and apex are akinetic. The apical anterior   segment  is hypokinetic. The anterior wall, entire lateral wall, anterior septum,  entire inferior wall, mid inferoseptal segment, and basal inferoseptal  segment are normal.   Right Ventricle: The right ventricular size is normal. No increase in  right ventricular wall thickness. Right ventricular systolic function is  normal. Tricuspid regurgitation signal is inadequate for assessing PA  pressure.   Left Atrium: Left atrial size was normal in size.   Right Atrium: Right atrial size was normal in size.   Pericardium: There is no evidence of pericardial effusion. Presence of  pericardial fat pad.   Mitral Valve: The mitral valve is grossly normal. Trivial mitral valve  regurgitation.   Tricuspid Valve: The tricuspid valve is grossly normal. Tricuspid valve  regurgitation is trivial.   Aortic Valve: The aortic valve is tricuspid. There is mild aortic valve  annular calcification. Aortic valve regurgitation is not visualized.   Pulmonic Valve: The pulmonic valve was not well visualized. Pulmonic valve  regurgitation is not visualized.   Aorta: The aortic root is normal in size and structure.   Venous: The inferior vena cava is normal in size with greater than 50%  respiratory variability, suggesting right atrial pressure of 3 mmHg.   IAS/Shunts: No atrial level shunt detected by color flow Doppler.  Laboratory Data:  High Sensitivity Troponin:  No results for input(s): TROPONINIHS in the last 720 hours.   Chemistry Recent Labs  Lab 12/17/20 0506 12/18/20 0322 12/19/20 0623  NA 138 140 139  K 3.9 4.2 3.8  CL 107 106 108  CO2 24 21* 23  GLUCOSE 188* 101* 138*  BUN '19 18 20  ' CREATININE 1.34* 1.34* 1.28*  CALCIUM 9.3 9.1 9.0  GFRNONAA >60 >60 >60  ANIONGAP '7 13 8    ' Recent Labs  Lab 12/17/20 0506  PROT 6.4*  ALBUMIN 3.9  AST 25  ALT 24  ALKPHOS 50  BILITOT 0.5   Lipids  Recent Labs  Lab 12/18/20 0322  CHOL 91  TRIG 74  HDL 27*  LDLCALC 49  CHOLHDL  3.4    Hematology Recent Labs  Lab 12/17/20 0506 12/18/20 0322 12/19/20 0623  WBC 10.7* 13.6* 10.7*  RBC 4.54 4.51 4.11*  HGB 14.6 14.7 13.4  HCT 42.6 44.1 39.4  MCV 93.8 97.8 95.9  MCH 32.2 32.6 32.6  MCHC 34.3 33.3 34.0  RDW 11.8 12.0 12.0  PLT 185 175 145*   Thyroid  Recent Labs  Lab 12/18/20 0322  TSH 1.183  FREET4 1.46*    BNPNo results for input(s): BNP, PROBNP in the last 168 hours.  DDimer No results for input(s): DDIMER in the last 168 hours.   Radiology/Studies:  CT Angio Head W or Wo Contrast  Result Date: 12/17/2020 CLINICAL DATA:  Left-sided weakness EXAM: CT ANGIOGRAPHY HEAD AND NECK TECHNIQUE: Multidetector CT imaging of the head and  neck was performed using the standard protocol during bolus administration of intravenous contrast. Multiplanar CT image reconstructions and MIPs were obtained to evaluate the vascular anatomy. Carotid stenosis measurements (when applicable) are obtained utilizing NASCET criteria, using the distal internal carotid diameter as the denominator. CONTRAST:  29m OMNIPAQUE IOHEXOL 350 MG/ML SOLN COMPARISON:  07/31/2019 FINDINGS: CTA NECK FINDINGS Aortic arch: Standard branching. Imaged portion shows no evidence of aneurysm or dissection. No significant stenosis of the major arch vessel origins. Right carotid system: Mixed density plaque at the bifurcation without flow limiting stenosis or ulceration. Left carotid system: Mixed density plaque at the bifurcation without flow limiting stenosis or ulceration. Vertebral arteries: No proximal subclavian stenosis. Strong left vertebral artery dominance. Skeleton: Degenerative endplate and facet spurring. Other neck: Negative Upper chest: Coronary calcification. Review of the MIP images confirms the above findings CTA HEAD FINDINGS Anterior circulation: Atheromatous calcification affecting the carotid siphons. Abrupt cut off at the distal right M1 segment with some downstream branch reconstitution.  Negative for aneurysm Posterior circulation: Basilar is fed solely by the left vertebral artery. Fetal type bilateral PCA flow. No branch occlusion, beading, or aneurysm. Venous sinuses: Unremarkable for the arterial phase Anatomic variants: As above Review of the MIP images confirms the above findings Critical Value/emergent results were called by telephone at the time of interpretation on 12/17/2020 at 5:24 am to [facility], who verbally acknowledged these results. IMPRESSION: 1. Emergent large vessel occlusion at the right M1 segment. 2. Nonobstructive atherosclerosis of the cervical and intracranial carotids. Electronically Signed   By: JJorje GuildM.D.   On: 12/17/2020 05:30   CT Angio Neck W and/or Wo Contrast  Result Date: 12/17/2020 CLINICAL DATA:  Left-sided weakness EXAM: CT ANGIOGRAPHY HEAD AND NECK TECHNIQUE: Multidetector CT imaging of the head and neck was performed using the standard protocol during bolus administration of intravenous contrast. Multiplanar CT image reconstructions and MIPs were obtained to evaluate the vascular anatomy. Carotid stenosis measurements (when applicable) are obtained utilizing NASCET criteria, using the distal internal carotid diameter as the denominator. CONTRAST:  719mOMNIPAQUE IOHEXOL 350 MG/ML SOLN COMPARISON:  07/31/2019 FINDINGS: CTA NECK FINDINGS Aortic arch: Standard branching. Imaged portion shows no evidence of aneurysm or dissection. No significant stenosis of the major arch vessel origins. Right carotid system: Mixed density plaque at the bifurcation without flow limiting stenosis or ulceration. Left carotid system: Mixed density plaque at the bifurcation without flow limiting stenosis or ulceration. Vertebral arteries: No proximal subclavian stenosis. Strong left vertebral artery dominance. Skeleton: Degenerative endplate and facet spurring. Other neck: Negative Upper chest: Coronary calcification. Review of the MIP images confirms the  above findings CTA HEAD FINDINGS Anterior circulation: Atheromatous calcification affecting the carotid siphons. Abrupt cut off at the distal right M1 segment with some downstream branch reconstitution. Negative for aneurysm Posterior circulation: Basilar is fed solely by the left vertebral artery. Fetal type bilateral PCA flow. No branch occlusion, beading, or aneurysm. Venous sinuses: Unremarkable for the arterial phase Anatomic variants: As above Review of the MIP images confirms the above findings Critical Value/emergent results were called by telephone at the time of interpretation on 12/17/2020 at 5:24 am to [facility] who verbally acknowledged these results. IMPRESSION: 1. Emergent large vessel occlusion at the right M1 segment. 2. Nonobstructive atherosclerosis of the cervical and intracranial carotids. Electronically Signed   By: JoJorje Guild.D.   On: 12/17/2020 05:30   MR ANGIO HEAD WO CONTRAST  Result Date: 12/17/2020 CLINICAL DATA:  Stroke follow-up. Status post thrombectomy for treatment of M1 occlusion. EXAM: MRI HEAD WITHOUT CONTRAST MRA HEAD WITHOUT CONTRAST TECHNIQUE: Multiplanar, multi-echo pulse sequences of the brain and surrounding structures were acquired without intravenous contrast. Angiographic images of the Circle of Willis were acquired using MRA technique without intravenous contrast. COMPARISON:  Head CT and CTA 12/17/2020.  Head MRI 08/01/2019. FINDINGS: MRI HEAD FINDINGS Brain: There is a small to moderate-sized acute right MCA infarct involving portions of the temporal, frontal, and parietal lobes and insula with associated petechial hemorrhage. A small chronic right frontal cortical infarct is again noted, and there are chronic bilateral cerebellar infarcts with associated chronic blood products. The ventricles are normal in size. No mass, midline shift, or extra-axial fluid collection is identified. Vascular: More fully evaluated below. Skull and upper cervical  spine: Unremarkable bone marrow signal. Sinuses/Orbits: Unremarkable orbits. Mild mucosal thickening in the paranasal sinuses. Small right and trace left mastoid effusions. Other: None. MRA HEAD FINDINGS Anterior circulation: The internal carotid arteries are widely patent from skull base to carotid termini. ACAs and MCAs are patent without evidence of a proximal branch occlusion or significant proximal stenosis. Specifically, the recanalized right M1 segment remains patent without an underlying stenosis. No aneurysm is identified. Posterior circulation: The included intracranial portion of the left vertebral artery is widely patent and supplies the basilar. The right vertebral artery is not visualized and was shown to be hypoplastic on the earlier CTA. The basilar artery is widely patent. There is a large right posterior communicating artery. 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 No significant proximal PCA stenosis is evident. No aneurysm is identified. Anatomic variants: As above. IMPRESSION: 1. Small to moderate-sized acute right MCA infarct. 2. Chronic right frontal and bilateral cerebellar infarcts. 3. Negative head MRA. Electronically Signed   By: Logan Bores M.D.   On: 12/17/2020 14:55   MR BRAIN WO CONTRAST  Result Date: 12/17/2020 CLINICAL DATA:  Stroke follow-up. Status post thrombectomy for treatment of M1 occlusion. EXAM: MRI HEAD WITHOUT CONTRAST MRA HEAD WITHOUT CONTRAST TECHNIQUE: Multiplanar, multi-echo pulse sequences of the brain and surrounding structures were acquired without intravenous contrast. Angiographic images of the Circle of Willis were acquired using MRA technique without intravenous contrast. COMPARISON:  Head CT and CTA 12/17/2020.  Head MRI 08/01/2019. FINDINGS: MRI HEAD FINDINGS Brain: There is a small to moderate-sized acute right MCA  infarct involving portions of the temporal, frontal, and parietal lobes and insula with associated petechial hemorrhage. A small chronic right frontal cortical infarct is again noted, and there are chronic bilateral cerebellar infarcts with associated chronic blood products. The ventricles are normal in size. No mass, midline shift, or extra-axial fluid collection is identified. Vascular: More fully evaluated below. Skull and upper cervical spine: Unremarkable bone marrow signal. Sinuses/Orbits: Unremarkable orbits. Mild mucosal thickening in the paranasal sinuses. Small right and trace left mastoid effusions. Other: None. MRA HEAD FINDINGS Anterior circulation: The internal carotid arteries are widely patent from skull base to carotid termini. ACAs and MCAs are patent without evidence of a proximal branch occlusion or significant proximal stenosis. Specifically, the recanalized right M1 segment remains patent without an underlying stenosis. No aneurysm is identified. Posterior circulation: The included intracranial portion of the left vertebral artery is widely patent and supplies the basilar. The right vertebral artery is not visualized and was shown to be hypoplastic on the earlier  CTA. The basilar artery is widely patent. There is a large right posterior communicating artery. 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 No significant proximal PCA stenosis is evident. No aneurysm is identified. Anatomic variants: As above. IMPRESSION: 1. Small to moderate-sized acute right MCA infarct. 2. Chronic right frontal and bilateral cerebellar infarcts. 3. Negative head MRA. Electronically Signed   By: Logan Bores M.D.   On: 12/17/2020 14:55   IR CT [facility]  Result Date: 12/19/2020 INDICATION: 60 year old male with a history acute stroke right M1/right MCA syndrome, presents for  angiogram and possible mechanical thrombectomy EXAM: ULTRASOUND-GUIDED ACCESS RIGHT COMMON FEMORAL ARTERY CERVICAL AND CEREBRAL ANGIOGRAM MECHANICAL THROMBECTOMY RIGHT MCA FLAT PANEL CT ANGIO-SEAL FOR HEMOSTASIS COMPARISON:  CT IMAGING SAME DAY MEDICATIONS: None ANESTHESIA/SEDATION: The anesthesia team was present to provide general endotracheal tube anesthesia and for patient monitoring during the procedure. Intubation was performed in room 2 neuro IR. Left radial arterial line was performed by the anesthesia team. Interventional neuro radiology nursing staff was also present. CONTRAST:  90 cc Omni 240 FLUOROSCOPY TIME:  Fluoroscopy Time: 19 minutes 6 seconds (1588 mGy). COMPLICATIONS: SIR LEVEL B - Normal therapy, includes overnight admission for observation. Subarachnoid hemorrhage TECHNIQUE: Informed written consent was obtained from the patient's family after a thorough discussion of the procedural risks, benefits and alternatives. Specific risks discussed include: Bleeding, infection, contrast reaction, kidney injury/failure, need for further procedure/surgery, arterial injury or dissection, embolization to new territory, intracranial hemorrhage (10-15% risk), neurologic deterioration, cardiopulmonary collapse, death. All questions were addressed. Maximal Sterile Barrier Technique was utilized including during the procedure including caps, mask, sterile gowns, sterile gloves, sterile drape, hand hygiene and skin antiseptic. A timeout was performed prior to the initiation of the procedure. The anesthesia team was present to provide general endotracheal tube anesthesia and for patient monitoring during the procedure. Interventional neuro radiology nursing staff was also present. FINDINGS: Initial Findings: Right common carotid artery:  Normal course caliber and contour. Right external carotid artery: Patent with antegrade flow. Right internal carotid artery: Normal course caliber and contour of the cervical  portion. Vertical and petrous segment patent with normal course caliber contour. Cavernous segment patent. Clinoid segment patent. Antegrade flow of the ophthalmic artery. Ophthalmic segment patent. Terminus patent. Right MCA: Occlusion was present at the branch point of superior and inferior. There had been migration of the M1 thrombus distally to the branch point, when compared to the baseline CT angiogram. The embolus had moved primarily into the inferior division, with flow maintained into the superior division on the initial angiogram. Right ACA: A 1 segment patent. Full perfusion of the right ACA territory with crossover flow into the left ACA territory via patent anterior communicating artery. Completion Findings: Right MCA: Restoration of flow through the right MCA, into the inferior and superior division branch point. The fragmented embolus entered the inferior division. The inferior division is the dominant division, with perfusion of the temporal lobe and the parietal lobe, with the superior segment perfusing essentially the frontal region only. After treatment, there is restoration of the proximal flow, with slow flow within distal branches of the inferior division, affecting both parietal and temporal lobes, with TICI 2c flow: Near complete perfusion except for slow flow in a few distal cortical vessels/presence of small distal cortical emboli Left: Left common carotid artery:  Normal course caliber and contour. Left external carotid  artery: Patent with antegrade flow. Left internal carotid artery: Normal course caliber and contour of the cervical portion. Mild atherosclerotic plaque at the origin of the left ICA without significant stenosis. Vertical and petrous segment patent with normal course caliber contour. Cavernous segment patent. Clinoid segment patent. Antegrade flow of the ophthalmic artery. Ophthalmic segment patent. Terminus patent. Left MCA: M1 segment patent. Insular and opercular  segments patent. Unremarkable caliber and course of the cortical segments. Typical arterial, capillary/ parenchymal, and venous phase. Left ACA: A 1 segment patent. A 2 segment perfuses the right ACA territory. Flat panel CT: Small volume subarachnoid hemorrhage within the right sulci, [place] IIIC PROCEDURE: The anesthesia team was present to provide general endotracheal tube anesthesia and for patient monitoring during the procedure. Intubation was performed in negative pressure [place] in neuro IR holding. Interventional neuro radiology nursing staff was also present. Ultrasound survey of the right inguinal region was performed with images stored and sent to PACs. 11 blade scalpel was used to make a small incision. Blunt dissection was performed with US guidance. A micropuncture needle was used access the right common femoral artery under ultrasound. With excellent arterial blood flow returned, an .018 micro wire was passed through the needle, observed to enter the abdominal aorta under fluoroscopy. The needle was removed, and a micropuncture sheath was placed over the wire. The inner dilator and wire were removed, and an 035 wire was advanced under fluoroscopy into the abdominal aorta. The sheath was removed and a 25cm 83F straight vascular sheath was placed. The dilator was removed and the sheath was flushed. Sheath was attached to pressurized and heparinized saline bag for constant forward flow. A coaxial system was then advanced over the 035 wire. This included a 95cm 087 "Walrus" balloon guide with coaxial 125cm Berenstein diagnostic catheter. This was advanced to the proximal descending thoracic aorta. Wire was then removed. Double flush of the catheter was performed. Catheter was then used to select the innominate artery. Angiogram was performed. Using roadmap technique, the catheter was advanced over a standard glide wire into right cervical ICA, with distal position achieved of the balloon guide. The  diagnostic catheter and the wire were removed. Formal angiogram was performed. Road map function was used once the occluded vessel was identified. Copious back flush was performed and the balloon catheter was attached to heparinized and pressurized saline bag for forward flow. A second coaxial system was then advanced through the balloon catheter, which included the selected intermediate catheter, microcatheter, and microwire. In this scenario, the set up included a zoom 71 aspiration catheter, a Trevo Provue18 microcatheter, and 014 synchro soft wire. This system was advanced through the balloon guide catheter under the road-map function, with adequate back-flush at the rotating hemostatic valve at that back end of the balloon guide. Once the wire and microcatheter were into the laceral segment, we attach heparinized saline pressured flush to the guide. Microcatheter and the intermediate catheter system were advanced through the terminal ICA and MCA gently to the proximal M1 segment, and were advanced up to but not through the occluded segment. The zoom catheter was then advanced over the microwire into the proximal M1 segment. The microcatheter microwire were then removed. The proprietary engine was attached to the hub of the aspiration catheter confirming return of flow. Catheter was then gently advanced into the embolus/thrombus at the site of occlusion. Approximately 1 minute of time was observed and then the catheter was withdrawn. Catheter was withdrawn into the balloon guide with free  aspiration performed at the hub of the aspiration catheter. Angiogram was performed, which confirmed restoration of flow through the proximal inferior segment, with distal migration of the thrombus into a distal branch. We elected to attempt a second pass. 2nd pass: Coaxial system was advanced through the balloon guide, which included a zoom 71 aspiration catheter, a Trevo Provue18 microcatheter, and 014 synchro soft wire. The  system was advanced through the balloon guide catheter under the road-map function, with adequate back-flush at the rotating hemostatic valve at that back end of the balloon guide. Microcatheter and the intermediate catheter system were advanced through the terminal ICA and MCA to the level of the bifurcation of superior/inferior division. The micro wire was then carefully advanced into the inferior segment and through the occluded segment. Microcatheter was then manipulated through the occluded segment and the wire was removed with saline drip at the hub. Blood was then aspirated through the hub of the microcatheter, and a gentle contrast injection was performed confirming intraluminal position within the inferior division. A rotating hemostatic valve was then attached to the back end of the microcatheter, and a pressurized and heparinized saline bag was attached to the catheter. 4 x 40 solitaire device was then selected. Back flush was achieved at the rotating hemostatic valve, and then the device was gently advanced through the microcatheter to the distal end. The retriever was then unsheathed by withdrawing the microcatheter under fluoroscopy. Once the retriever was completely unsheathed, the microcatheter was carefully stripped from the delivery device. Control angiogram was performed from the intermediate catheter. A 3 minute time interval was observed. Constant aspiration using the proprietary engine was then performed at the intermediate catheter, as the retriever was gently and slowly withdrawn with fluoroscopic observation. Once the retriever was "corked" within the tip of the intermediate catheter, both were removed from the system. Free aspiration was confirmed at the hub of the balloon guide catheter, with free blood return confirmed. The balloon was then deflated, and a control angiogram was performed. Improved flow, with again distal migration within the inferior division. We elected to attempt a  third pass. 3rd pass: The 014 synchro soft wire was loaded through a zoom 35 aspiration catheter. Roadmap angiogram was performed. The aspiration catheter and the microwire were then advanced to the inferior segment, just proximal to the occlusion site. Once the aspiration catheter was just proximal to the occlusion site the microwire was removed. The rotating hemostatic valve was then removed from the hub, and the proprietary engine was attached directly to the hub of the zoom 35 catheter the pump was turned on confirming backflow and the catheter was advanced until cessation of flow was observed. Catheter was advanced through the site of the occlusion and then withdrawn. Upon withdrawal, aspiration was performed at the hub of the balloon guide catheter. Once the aspiration catheter was completely removed, aspiration was performed at the hub of the balloon guide confirming free return of blood. Angiogram was performed. There was improved flow through the site of the occlusion, however, again distal migration was observed within the affected division, with again distal migration within the inferior division. We elected 1 final pass into the inferior division M3/M4 with the microcatheter. Fourth pass: A roadmap angiogram was performed. Through the balloon guide, a 160 cm Trevo Trak microcatheter was advanced with the synchro soft, into the distal aspect of the inferior division, targeting the residual, most proximal occlusive embolus of the inferior division branches. Once the microcatheter was distal to the  site of the occlusion, the microwire was removed. Catheter aspiration was performed as the catheter was then withdrawn. Catheter was completely withdrawn from the system. Angiogram performed demonstrated persistent occlusion in the distal M3/M4 cortical branch of the inferior division. At this time we elected to terminate the case. Balloon guide was withdrawn. A JB 1 catheter was used to select the origin of the  left CCA. Angiogram was performed. JB 1 catheter was then removed. The skin at the puncture site was then cleaned with Chlorhexidine. The 8 French sheath was removed and an 44F angioseal was deployed. Flat panel CT was performed. Patient was extubated once the CT was reviewed. Patient tolerated the procedure well and remained hemodynamically stable throughout. No complications were encountered and no significant blood loss encountered. IMPRESSION: Status post ultrasound guided access right common femoral artery for mechanical thrombectomy of right-sided MCA ELVO, with 4 passes achieving TICI 2c flow at the completion. Angiogram of the left ICA demonstrates no occlusive branches intracranial. Angio-Seal restored for hemostasis. Signed, Dulcy Fanny. Dellia Nims, RPVI [facility] Radiology PLAN: Patient extubated ICU status Target systolic blood pressure of 120-140 Right hip straight time 4 hours Frequent neurovascular checks Repeat neurologic imaging with CT and/MRI at the discretion of neurology team Electronically Signed   By: Corrie Mckusick D.O.   On: 12/18/2020 21:30   IR US Guide Vasc Access Right  Result Date: 12/19/2020 INDICATION: 60 year old male with a history acute stroke right M1/right MCA syndrome, presents for angiogram and possible mechanical thrombectomy EXAM: ULTRASOUND-GUIDED ACCESS RIGHT COMMON FEMORAL ARTERY CERVICAL AND CEREBRAL ANGIOGRAM MECHANICAL THROMBECTOMY RIGHT MCA FLAT PANEL CT ANGIO-SEAL FOR HEMOSTASIS COMPARISON:  CT IMAGING SAME DAY MEDICATIONS: None ANESTHESIA/SEDATION: The anesthesia team was present to provide general endotracheal tube anesthesia and for patient monitoring during the procedure. Intubation was performed in room 2 neuro IR. Left radial arterial line was performed by the anesthesia team. Interventional neuro radiology nursing staff was also present. CONTRAST:  90 cc Omni 240 FLUOROSCOPY TIME:  Fluoroscopy Time: 19 minutes 6  seconds (1588 mGy). COMPLICATIONS: SIR LEVEL B - Normal therapy, includes overnight admission for observation. Subarachnoid hemorrhage TECHNIQUE: Informed written consent was obtained from the patient's family after a thorough discussion of the procedural risks, benefits and alternatives. Specific risks discussed include: Bleeding, infection, contrast reaction, kidney injury/failure, need for further procedure/surgery, arterial injury or dissection, embolization to new territory, intracranial hemorrhage (10-15% risk), neurologic deterioration, cardiopulmonary collapse, death. All questions were addressed. Maximal Sterile Barrier Technique was utilized including during the procedure including caps, mask, sterile gowns, sterile gloves, sterile drape, hand hygiene and skin antiseptic. A timeout was performed prior to the initiation of the procedure. The anesthesia team was present to provide general endotracheal tube anesthesia and for patient monitoring during the procedure. Interventional neuro radiology nursing staff was also present. FINDINGS: Initial Findings: Right common carotid artery:  Normal course caliber and contour. Right external carotid artery: Patent with antegrade flow. Right internal carotid artery: Normal course caliber and contour of the cervical portion. Vertical and petrous segment patent with normal course caliber contour. Cavernous segment patent. Clinoid segment patent. Antegrade flow of the ophthalmic artery. Ophthalmic segment patent. Terminus patent. Right MCA: Occlusion was present at the branch point of superior and inferior. There had been migration of the M1 thrombus distally to the branch point, when compared to the baseline CT angiogram. The embolus had moved primarily into the inferior division, with flow maintained into the superior division on the initial  angiogram. Right ACA: A 1 segment patent. Full perfusion of the right ACA territory with crossover flow into the left ACA  territory via patent anterior communicating artery. Completion Findings: Right MCA: Restoration of flow through the right MCA, into the inferior and superior division branch point. The fragmented embolus entered the inferior division. The inferior division is the dominant division, with perfusion of the temporal lobe and the parietal lobe, with the superior segment perfusing essentially the frontal region only. After treatment, there is restoration of the proximal flow, with slow flow within distal branches of the inferior division, affecting both parietal and temporal lobes, with TICI 2c flow: Near complete perfusion except for slow flow in a few distal cortical vessels/presence of small distal cortical emboli Left: Left common carotid artery:  Normal course caliber and contour. Left external carotid artery: Patent with antegrade flow. Left internal carotid artery: Normal course caliber and contour of the cervical portion. Mild atherosclerotic plaque at the origin of the left ICA without significant stenosis. Vertical and petrous segment patent with normal course caliber contour. Cavernous segment patent. Clinoid segment patent. Antegrade flow of the ophthalmic artery. Ophthalmic segment patent. Terminus patent. Left MCA: M1 segment patent. Insular and opercular segments patent. Unremarkable caliber and course of the cortical segments. Typical arterial, capillary/ parenchymal, and venous phase. Left ACA: A 1 segment patent. A 2 segment perfuses the right ACA territory. Flat panel CT: Small volume subarachnoid hemorrhage within the right sulci, [place] IIIC PROCEDURE: The anesthesia team was present to provide general endotracheal tube anesthesia and for patient monitoring during the procedure. Intubation was performed in negative pressure [place] in neuro IR holding. Interventional neuro radiology nursing staff was also present. Ultrasound survey of the right inguinal region was performed with images stored and sent  to PACs. 11 blade scalpel was used to make a small incision. Blunt dissection was performed with US guidance. A micropuncture needle was used access the right common femoral artery under ultrasound. With excellent arterial blood flow returned, an .018 micro wire was passed through the needle, observed to enter the abdominal aorta under fluoroscopy. The needle was removed, and a micropuncture sheath was placed over the wire. The inner dilator and wire were removed, and an 035 wire was advanced under fluoroscopy into the abdominal aorta. The sheath was removed and a 25cm 15F straight vascular sheath was placed. The dilator was removed and the sheath was flushed. Sheath was attached to pressurized and heparinized saline bag for constant forward flow. A coaxial system was then advanced over the 035 wire. This included a 95cm 087 "Walrus" balloon guide with coaxial 125cm Berenstein diagnostic catheter. This was advanced to the proximal descending thoracic aorta. Wire was then removed. Double flush of the catheter was performed. Catheter was then used to select the innominate artery. Angiogram was performed. Using roadmap technique, the catheter was advanced over a standard glide wire into right cervical ICA, with distal position achieved of the balloon guide. The diagnostic catheter and the wire were removed. Formal angiogram was performed. Road map function was used once the occluded vessel was identified. Copious back flush was performed and the balloon catheter was attached to heparinized and pressurized saline bag for forward flow. A second coaxial system was then advanced through the balloon catheter, which included the selected intermediate catheter, microcatheter, and microwire. In this scenario, the set up included a zoom 71 aspiration catheter, a Trevo Provue18 microcatheter, and 014 synchro soft wire. This system was advanced through the balloon guide catheter  under the road-map function, with adequate  back-flush at the rotating hemostatic valve at that back end of the balloon guide. Once the wire and microcatheter were into the laceral segment, we attach heparinized saline pressured flush to the guide. Microcatheter and the intermediate catheter system were advanced through the terminal ICA and MCA gently to the proximal M1 segment, and were advanced up to but not through the occluded segment. The zoom catheter was then advanced over the microwire into the proximal M1 segment. The microcatheter microwire were then removed. The proprietary engine was attached to the hub of the aspiration catheter confirming return of flow. Catheter was then gently advanced into the embolus/thrombus at the site of occlusion. Approximately 1 minute of time was observed and then the catheter was withdrawn. Catheter was withdrawn into the balloon guide with free aspiration performed at the hub of the aspiration catheter. Angiogram was performed, which confirmed restoration of flow through the proximal inferior segment, with distal migration of the thrombus into a distal branch. We elected to attempt a second pass. 2nd pass: Coaxial system was advanced through the balloon guide, which included a zoom 71 aspiration catheter, a Trevo Provue18 microcatheter, and 014 synchro soft wire. The system was advanced through the balloon guide catheter under the road-map function, with adequate back-flush at the rotating hemostatic valve at that back end of the balloon guide. Microcatheter and the intermediate catheter system were advanced through the terminal ICA and MCA to the level of the bifurcation of superior/inferior division. The micro wire was then carefully advanced into the inferior segment and through the occluded segment. Microcatheter was then manipulated through the occluded segment and the wire was removed with saline drip at the hub. Blood was then aspirated through the hub of the microcatheter, and a gentle contrast injection was  performed confirming intraluminal position within the inferior division. A rotating hemostatic valve was then attached to the back end of the microcatheter, and a pressurized and heparinized saline bag was attached to the catheter. 4 x 40 solitaire device was then selected. Back flush was achieved at the rotating hemostatic valve, and then the device was gently advanced through the microcatheter to the distal end. The retriever was then unsheathed by withdrawing the microcatheter under fluoroscopy. Once the retriever was completely unsheathed, the microcatheter was carefully stripped from the delivery device. Control angiogram was performed from the intermediate catheter. A 3 minute time interval was observed. Constant aspiration using the proprietary engine was then performed at the intermediate catheter, as the retriever was gently and slowly withdrawn with fluoroscopic observation. Once the retriever was "corked" within the tip of the intermediate catheter, both were removed from the system. Free aspiration was confirmed at the hub of the balloon guide catheter, with free blood return confirmed. The balloon was then deflated, and a control angiogram was performed. Improved flow, with again distal migration within the inferior division. We elected to attempt a third pass. 3rd pass: The 014 synchro soft wire was loaded through a zoom 35 aspiration catheter. Roadmap angiogram was performed. The aspiration catheter and the microwire were then advanced to the inferior segment, just proximal to the occlusion site. Once the aspiration catheter was just proximal to the occlusion site the microwire was removed. The rotating hemostatic valve was then removed from the hub, and the proprietary engine was attached directly to the hub of the zoom 35 catheter the pump was turned on confirming backflow and the catheter was advanced until cessation of flow was observed.  Catheter was advanced through the site of the occlusion and  then withdrawn. Upon withdrawal, aspiration was performed at the hub of the balloon guide catheter. Once the aspiration catheter was completely removed, aspiration was performed at the hub of the balloon guide confirming free return of blood. Angiogram was performed. There was improved flow through the site of the occlusion, however, again distal migration was observed within the affected division, with again distal migration within the inferior division. We elected 1 final pass into the inferior division M3/M4 with the microcatheter. Fourth pass: A roadmap angiogram was performed. Through the balloon guide, a 160 cm Trevo Trak microcatheter was advanced with the synchro soft, into the distal aspect of the inferior division, targeting the residual, most proximal occlusive embolus of the inferior division branches. Once the microcatheter was distal to the site of the occlusion, the microwire was removed. Catheter aspiration was performed as the catheter was then withdrawn. Catheter was completely withdrawn from the system. Angiogram performed demonstrated persistent occlusion in the distal M3/M4 cortical branch of the inferior division. At this time we elected to terminate the case. Balloon guide was withdrawn. A JB 1 catheter was used to select the origin of the left CCA. Angiogram was performed. JB 1 catheter was then removed. The skin at the puncture site was then cleaned with Chlorhexidine. The 8 French sheath was removed and an 28F angioseal was deployed. Flat panel CT was performed. Patient was extubated once the CT was reviewed. Patient tolerated the procedure well and remained hemodynamically stable throughout. No complications were encountered and no significant blood loss encountered. IMPRESSION: Status post ultrasound guided access right common femoral artery for mechanical thrombectomy of right-sided MCA ELVO, with 4 passes achieving TICI 2c flow at the completion. Angiogram of the left ICA demonstrates no  occlusive branches intracranial. Angio-Seal restored for hemostasis. Signed, Dulcy Fanny. Dellia Nims, RPVI V[facility] Inter[facility] Radiology Special[facility] Radiology PLAN: Patient extubated ICU status Target systolic blood pressure of 120-140 Right hip straight time 4 hours Frequent neurovascular checks Repeat neurologic imaging with CT and/MRI at the discretion of neurology team Electronically Signed   By: Corrie Mckusick D.O.   On: 12/18/2020 21:30   ECHOCARDIOGRAM COMPLETE  Result Date: 12/17/2020    ECHOCARDIOGRAM REPORT   Patient Name:   MARTY UY [place] Date of Exam: 12/17/2020 Medical Rec #:  [id number]      Height:       72.0 in Accession #:    [id number]     Weight:       245.1 lb Date of Birth:  [birth year]      BSA:          2.323 m Patient Age:    18 years       BP:           122/72 mmHg Patient Gender: M              HR:           72 bpm. Exam Location:  Inpatient Procedure: 2D Echo, Cardiac Doppler and Color Doppler Indications:    Stroke 434.91 / I163.9  History:        Patient has prior history of Echocardiogram examinations, most                 recent 08/03/2019. Previous Myocardial Infarction, Stroke, Mitral                 Valve Disease; Risk Factors:Hypertension, Diabetes and  Dyslipidemia. Substance abuse ([place]) (From Hx).  Sonographer:    Leavy Cella RDCS Sonographer#2:  Alvino Chapel RCS Referring Phys: [phone] MCNEILL P KIRKPATRICK IMPRESSIONS  1. Definity contrast utilized. There is a nonmobile LV mural thrombus (approximately 1.2 x 0.5 cm) in region of akinesis involving the apical septal wall. Reported to covering team at 5:10 PM.  2. Left ventricular ejection fraction, by estimation, is 60 to 65%. The left ventricle has normal function. The left ventricle demonstrates regional wall motion abnormalities (see scoring diagram/findings for description). There is mild left ventricular  hypertrophy. Left ventricular diastolic parameters are indeterminate.  3. Right  ventricular systolic function is normal. The right ventricular size is normal. Tricuspid regurgitation signal is inadequate for assessing PA pressure.  4. The mitral valve is grossly normal. Trivial mitral valve regurgitation.  5. The aortic valve is tricuspid. Aortic valve regurgitation is not visualized.  6. The inferior vena cava is normal in size with greater than 50% respiratory variability, suggesting right atrial pressure of 3 mmHg. Comparison(s): Prior images reviewed side by side. LV mural thrombus newly identified. Prior study from May 2021 did have similar apical septal wall motion abnormality. FINDINGS  Left Ventricle: Left ventricular ejection fraction, by estimation, is 60 to 65%. The left ventricle has normal function. The left ventricle demonstrates regional wall motion abnormalities. Definity contrast agent was given IV to delineate the left ventricular endocardial borders. The left ventricular internal cavity size was normal in size. There is mild left ventricular hypertrophy. Left ventricular diastolic parameters are indeterminate.  LV Wall Scoring: The apical septal segment and apex are akinetic. The apical anterior segment is hypokinetic. The anterior wall, entire lateral wall, anterior septum, entire inferior wall, mid inferoseptal segment, and basal inferoseptal segment are normal. Right Ventricle: The right ventricular size is normal. No increase in right ventricular wall thickness. Right ventricular systolic function is normal. Tricuspid regurgitation signal is inadequate for assessing PA pressure. Left Atrium: Left atrial size was normal in size. Right Atrium: Right atrial size was normal in size. Pericardium: There is no evidence of pericardial effusion. Presence of pericardial fat pad. Mitral Valve: The mitral valve is grossly normal. Trivial mitral valve regurgitation. Tricuspid Valve: The tricuspid valve is grossly normal. Tricuspid valve regurgitation is trivial. Aortic Valve: The  aortic valve is tricuspid. There is mild aortic valve annular calcification. Aortic valve regurgitation is not visualized. Pulmonic Valve: The pulmonic valve was not well visualized. Pulmonic valve regurgitation is not visualized. Aorta: The aortic root is normal in size and structure. Venous: The inferior vena cava is normal in size with greater than 50% respiratory variability, suggesting right atrial pressure of 3 mmHg. IAS/Shunts: No atrial level shunt detected by color flow Doppler.  LEFT VENTRICLE PLAX 2D LVIDd:         5.40 cm  Diastology LVIDs:         3.50 cm  LV e' medial:    6.85 cm/s LV PW:         1.10 cm  LV E/e' medial:  8.9 LV IVS:        1.10 cm  LV e' lateral:   7.83 cm/s LVOT diam:     2.00 cm  LV E/e' lateral: 7.8 LVOT Area:     3.14 cm  RIGHT VENTRICLE RV S prime:     15.90 cm/s TAPSE (M-mode): 2.4 cm LEFT ATRIUM             Index       RIGHT ATRIUM  Index LA diam:        3.50 cm 1.51 cm/m  RA Area:     9.85 cm LA Vol (A2C):   48.8 ml 21.01 ml/m RA Volume:   21.30 ml 9.17 ml/m LA Vol (A4C):   18.1 ml 7.79 ml/m LA Biplane Vol: 30.1 ml 12.96 ml/m   AORTA Ao Root diam: 2.80 cm MITRAL VALVE MV Area (PHT): 3.08 cm    SHUNTS MV Decel Time: 246 msec    Systemic Diam: 2.00 cm MV E velocity: 60.80 cm/s MV A velocity: 78.60 cm/s MV E/A ratio:  0.77 Rozann Lesches MD Electronically signed by Rozann Lesches MD Signature Date/Time: 12/17/2020/5:19:12 PM    Final    IR PERCUTANEOUS ART THROMBECTOMY/INFUSION INTRACRANIAL INC DIAG ANGIO  Result Date: 12/19/2020 INDICATION: 60 year old male with a history acute stroke right M1/right MCA syndrome, presents for angiogram and possible mechanical thrombectomy EXAM: ULTRASOUND-GUIDED ACCESS RIGHT COMMON FEMORAL ARTERY CERVICAL AND CEREBRAL ANGIOGRAM MECHANICAL THROMBECTOMY RIGHT MCA FLAT PANEL CT ANGIO-SEAL FOR HEMOSTASIS COMPARISON:  CT IMAGING SAME DAY MEDICATIONS: None ANESTHESIA/SEDATION: The anesthesia team was present to provide general  endotracheal tube anesthesia and for patient monitoring during the procedure. Intubation was performed in room 2 neuro IR. Left radial arterial line was performed by the anesthesia team. Interventional neuro radiology nursing staff was also present. CONTRAST:  90 cc Omni 240 FLUOROSCOPY TIME:  Fluoroscopy Time: 19 minutes 6 seconds (1588 mGy). COMPLICATIONS: SIR LEVEL B - Normal therapy, includes overnight admission for observation. Subarachnoid hemorrhage TECHNIQUE: Informed written consent was obtained from the patient's family after a thorough discussion of the procedural risks, benefits and alternatives. Specific risks discussed include: Bleeding, infection, contrast reaction, kidney injury/failure, need for further procedure/surgery, arterial injury or dissection, embolization to new territory, intracranial hemorrhage (10-15% risk), neurologic deterioration, cardiopulmonary collapse, death. All questions were addressed. Maximal Sterile Barrier Technique was utilized including during the procedure including caps, mask, sterile gowns, sterile gloves, sterile drape, hand hygiene and skin antiseptic. A timeout was performed prior to the initiation of the procedure. The anesthesia team was present to provide general endotracheal tube anesthesia and for patient monitoring during the procedure. Interventional neuro radiology nursing staff was also present. FINDINGS: Initial Findings: Right common carotid artery:  Normal course caliber and contour. Right external carotid artery: Patent with antegrade flow. Right internal carotid artery: Normal course caliber and contour of the cervical portion. Vertical and petrous segment patent with normal course caliber contour. Cavernous segment patent. Clinoid segment patent. Antegrade flow of the ophthalmic artery. Ophthalmic segment patent. Terminus patent. Right MCA: Occlusion was present at the branch point of superior and inferior. There had been migration of the M1 thrombus  distally to the branch point, when compared to the baseline CT angiogram. The embolus had moved primarily into the inferior division, with flow maintained into the superior division on the initial angiogram. Right ACA: A 1 segment patent. Full perfusion of the right ACA territory with crossover flow into the left ACA territory via patent anterior communicating artery. Completion Findings: Right MCA: Restoration of flow through the right MCA, into the inferior and superior division branch point. The fragmented embolus entered the inferior division. The inferior division is the dominant division, with perfusion of the temporal lobe and the parietal lobe, with the superior segment perfusing essentially the frontal region only. After treatment, there is restoration of the proximal flow, with slow flow within distal branches of the inferior division, affecting both parietal and temporal lobes, with TICI 2c flow: Near  complete perfusion except for slow flow in a few distal cortical vessels/presence of small distal cortical emboli Left: Left common carotid artery:  Normal course caliber and contour. Left external carotid artery: Patent with antegrade flow. Left internal carotid artery: Normal course caliber and contour of the cervical portion. Mild atherosclerotic plaque at the origin of the left ICA without significant stenosis. Vertical and petrous segment patent with normal course caliber contour. Cavernous segment patent. Clinoid segment patent. Antegrade flow of the ophthalmic artery. Ophthalmic segment patent. Terminus patent. Left MCA: M1 segment patent. Insular and opercular segments patent. Unremarkable caliber and course of the cortical segments. Typical arterial, capillary/ parenchymal, and venous phase. Left ACA: A 1 segment patent. A 2 segment perfuses the right ACA territory. Flat panel CT: Small volume subarachnoid hemorrhage within the right sulci, [place] IIIC PROCEDURE: The anesthesia team was present  to provide general endotracheal tube anesthesia and for patient monitoring during the procedure. Intubation was performed in negative pressure [place] in neuro IR holding. Interventional neuro radiology nursing staff was also present. Ultrasound survey of the right inguinal region was performed with images stored and sent to PACs. 11 blade scalpel was used to make a small incision. Blunt dissection was performed with US guidance. A micropuncture needle was used access the right common femoral artery under ultrasound. With excellent arterial blood flow returned, an .018 micro wire was passed through the needle, observed to enter the abdominal aorta under fluoroscopy. The needle was removed, and a micropuncture sheath was placed over the wire. The inner dilator and wire were removed, and an 035 wire was advanced under fluoroscopy into the abdominal aorta. The sheath was removed and a 25cm 64F straight vascular sheath was placed. The dilator was removed and the sheath was flushed. Sheath was attached to pressurized and heparinized saline bag for constant forward flow. A coaxial system was then advanced over the 035 wire. This included a 95cm 087 "Walrus" balloon guide with coaxial 125cm Berenstein diagnostic catheter. This was advanced to the proximal descending thoracic aorta. Wire was then removed. Double flush of the catheter was performed. Catheter was then used to select the innominate artery. Angiogram was performed. Using roadmap technique, the catheter was advanced over a standard glide wire into right cervical ICA, with distal position achieved of the balloon guide. The diagnostic catheter and the wire were removed. Formal angiogram was performed. Road map function was used once the occluded vessel was identified. Copious back flush was performed and the balloon catheter was attached to heparinized and pressurized saline bag for forward flow. A second coaxial system was then advanced through the balloon catheter,  which included the selected intermediate catheter, microcatheter, and microwire. In this scenario, the set up included a zoom 71 aspiration catheter, a Trevo Provue18 microcatheter, and 014 synchro soft wire. This system was advanced through the balloon guide catheter under the road-map function, with adequate back-flush at the rotating hemostatic valve at that back end of the balloon guide. Once the wire and microcatheter were into the laceral segment, we attach heparinized saline pressured flush to the guide. Microcatheter and the intermediate catheter system were advanced through the terminal ICA and MCA gently to the proximal M1 segment, and were advanced up to but not through the occluded segment. The zoom catheter was then advanced over the microwire into the proximal M1 segment. The microcatheter microwire were then removed. The proprietary engine was attached to the hub of the aspiration catheter confirming return of flow. Catheter was then gently  advanced into the embolus/thrombus at the site of occlusion. Approximately 1 minute of time was observed and then the catheter was withdrawn. Catheter was withdrawn into the balloon guide with free aspiration performed at the hub of the aspiration catheter. Angiogram was performed, which confirmed restoration of flow through the proximal inferior segment, with distal migration of the thrombus into a distal branch. We elected to attempt a second pass. 2nd pass: Coaxial system was advanced through the balloon guide, which included a zoom 71 aspiration catheter, a Trevo Provue18 microcatheter, and 014 synchro soft wire. The system was advanced through the balloon guide catheter under the road-map function, with adequate back-flush at the rotating hemostatic valve at that back end of the balloon guide. Microcatheter and the intermediate catheter system were advanced through the terminal ICA and MCA to the level of the bifurcation of superior/inferior division. The  micro wire was then carefully advanced into the inferior segment and through the occluded segment. Microcatheter was then manipulated through the occluded segment and the wire was removed with saline drip at the hub. Blood was then aspirated through the hub of the microcatheter, and a gentle contrast injection was performed confirming intraluminal position within the inferior division. A rotating hemostatic valve was then attached to the back end of the microcatheter, and a pressurized and heparinized saline bag was attached to the catheter. 4 x 40 solitaire device was then selected. Back flush was achieved at the rotating hemostatic valve, and then the device was gently advanced through the microcatheter to the distal end. The retriever was then unsheathed by withdrawing the microcatheter under fluoroscopy. Once the retriever was completely unsheathed, the microcatheter was carefully stripped from the delivery device. Control angiogram was performed from the intermediate catheter. A 3 minute time interval was observed. Constant aspiration using the proprietary engine was then performed at the intermediate catheter, as the retriever was gently and slowly withdrawn with fluoroscopic observation. Once the retriever was "corked" within the tip of the intermediate catheter, both were removed from the system. Free aspiration was confirmed at the hub of the balloon guide catheter, with free blood return confirmed. The balloon was then deflated, and a control angiogram was performed. Improved flow, with again distal migration within the inferior division. We elected to attempt a third pass. 3rd pass: The 014 synchro soft wire was loaded through a zoom 35 aspiration catheter. Roadmap angiogram was performed. The aspiration catheter and the microwire were then advanced to the inferior segment, just proximal to the occlusion site. Once the aspiration catheter was just proximal to the occlusion site the microwire was removed.  The rotating hemostatic valve was then removed from the hub, and the proprietary engine was attached directly to the hub of the zoom 35 catheter the pump was turned on confirming backflow and the catheter was advanced until cessation of flow was observed. Catheter was advanced through the site of the occlusion and then withdrawn. Upon withdrawal, aspiration was performed at the hub of the balloon guide catheter. Once the aspiration catheter was completely removed, aspiration was performed at the hub of the balloon guide confirming free return of blood. Angiogram was performed. There was improved flow through the site of the occlusion, however, again distal migration was observed within the affected division, with again distal migration within the inferior division. We elected 1 final pass into the inferior division M3/M4 with the microcatheter. Fourth pass: A roadmap angiogram was performed. Through the balloon guide, a 160 cm Trevo Trak microcatheter was advanced  with the synchro soft, into the distal aspect of the inferior division, targeting the residual, most proximal occlusive embolus of the inferior division branches. Once the microcatheter was distal to the site of the occlusion, the microwire was removed. Catheter aspiration was performed as the catheter was then withdrawn. Catheter was completely withdrawn from the system. Angiogram performed demonstrated persistent occlusion in the distal M3/M4 cortical branch of the inferior division. At this time we elected to terminate the case. Balloon guide was withdrawn. A JB 1 catheter was used to select the origin of the left CCA. Angiogram was performed. JB 1 catheter was then removed. The skin at the puncture site was then cleaned with Chlorhexidine. The 8 French sheath was removed and an 108F angioseal was deployed. Flat panel CT was performed. Patient was extubated once the CT was reviewed. Patient tolerated the procedure well and remained hemodynamically stable  throughout. No complications were encountered and no significant blood loss encountered. IMPRESSION: Status post ultrasound guided access right common femoral artery for mechanical thrombectomy of right-sided MCA ELVO, with 4 passes achieving TICI 2c flow at the completion. Angiogram of the left ICA demonstrates no occlusive branches intracranial. Angio-Seal restored for hemostasis. Signed, Dulcy Fanny. Dellia Nims, RPVI Vascular and Interventional Radiology Specialists [facility] Radiology PLAN: Patient extubated ICU status Target systolic blood pressure of 120-140 Right hip straight time 4 hours Frequent neurovascular checks Repeat neurologic imaging with CT and/MRI at the discretion of neurology team Electronically Signed   By: Corrie Mckusick D.O.   On: 12/18/2020 21:30   CT HEAD CODE STROKE WO CONTRAST  Result Date: 12/17/2020 CLINICAL DATA:  Code stroke.  Left-sided weakness EXAM: CT HEAD WITHOUT CONTRAST TECHNIQUE: Contiguous axial images were obtained from the base of the skull through the vertex without intravenous contrast. COMPARISON:  08/02/2019 FINDINGS: Brain: Small chronic high right frontal cortex infarct. No convincing acute infarct. No acute hemorrhage. Remote bilateral cerebellar infarcts. No hydrocephalus or mass. Vascular: Hyperdense right M1 segment Skull: Negative Sinuses/Orbits: Negative Other: Critical Value/emergent results were called by telephone at the time of interpretation on 12/17/2020 at 5:24 am to provider [facility] , who verbally acknowledged these results. ASPECTS [facility] Stroke Program Early CT Score) - Ganglionic level infarction (caudate, lentiform nuclei, internal capsule, insula, M1-M3 cortex): 7 - Supraganglionic infarction (M4-M6 cortex): 3 when accounting for chronic cortex infarct Total score (0-10 with 10 being normal): 10 IMPRESSION: 1. Hyperdense right M1 segment, reference CTA. 2. No acute hemorrhage or visible acute infarct. 3. Remote right frontal cortex and  bilateral cerebellar infarcts. Electronically Signed   By: Jorje Guild M.D.   On: 12/17/2020 05:25   IR ANGIO INTRA EXTRACRAN SEL COM CAROTID INNOMINATE UNI L MOD SED  Result Date: 12/19/2020 INDICATION: 60 year old male with a history acute stroke right M1/right MCA syndrome, presents for angiogram and possible mechanical thrombectomy EXAM: ULTRASOUND-GUIDED ACCESS RIGHT COMMON FEMORAL ARTERY CERVICAL AND CEREBRAL ANGIOGRAM MECHANICAL THROMBECTOMY RIGHT MCA FLAT PANEL CT ANGIO-SEAL FOR HEMOSTASIS COMPARISON:  CT IMAGING SAME DAY MEDICATIONS: None ANESTHESIA/SEDATION: The anesthesia team was present to provide general endotracheal tube anesthesia and for patient monitoring during the procedure. Intubation was performed in room 2 neuro IR. Left radial arterial line was performed by the anesthesia team. Interventional neuro radiology nursing staff was also present. CONTRAST:  90 cc Omni 240 FLUOROSCOPY TIME:  Fluoroscopy Time: 19 minutes 6 seconds (1588 mGy). COMPLICATIONS: SIR LEVEL B - Normal therapy, includes overnight admission for observation. Subarachnoid hemorrhage TECHNIQUE: Informed written consent was obtained from  the patient's family after a thorough discussion of the procedural risks, benefits and alternatives. Specific risks discussed include: Bleeding, infection, contrast reaction, kidney injury/failure, need for further procedure/surgery, arterial injury or dissection, embolization to new territory, intracranial hemorrhage (10-15% risk), neurologic deterioration, cardiopulmonary collapse, death. All questions were addressed. Maximal Sterile Barrier Technique was utilized including during the procedure including caps, mask, sterile gowns, sterile gloves, sterile drape, hand hygiene and skin antiseptic. A timeout was performed prior to the initiation of the procedure. The anesthesia team was present to provide general endotracheal tube anesthesia and for patient monitoring during the procedure.  Interventional neuro radiology nursing staff was also present. FINDINGS: Initial Findings: Right common carotid artery:  Normal course caliber and contour. Right external carotid artery: Patent with antegrade flow. Right internal carotid artery: Normal course caliber and contour of the cervical portion. Vertical and petrous segment patent with normal course caliber contour. Cavernous segment patent. Clinoid segment patent. Antegrade flow of the ophthalmic artery. Ophthalmic segment patent. Terminus patent. Right MCA: Occlusion was present at the branch point of superior and inferior. There had been migration of the M1 thrombus distally to the branch point, when compared to the baseline CT angiogram. The embolus had moved primarily into the inferior division, with flow maintained into the superior division on the initial angiogram. Right ACA: A 1 segment patent. Full perfusion of the right ACA territory with crossover flow into the left ACA territory via patent anterior communicating artery. Completion Findings: Right MCA: Restoration of flow through the right MCA, into the inferior and superior division branch point. The fragmented embolus entered the inferior division. The inferior division is the dominant division, with perfusion of the temporal lobe and the parietal lobe, with the superior segment perfusing essentially the frontal region only. After treatment, there is restoration of the proximal flow, with slow flow within distal branches of the inferior division, affecting both parietal and temporal lobes, with TICI 2c flow: Near complete perfusion except for slow flow in a few distal cortical vessels/presence of small distal cortical emboli Left: Left common carotid artery:  Normal course caliber and contour. Left external carotid artery: Patent with antegrade flow. Left internal carotid artery: Normal course caliber and contour of the cervical portion. Mild atherosclerotic plaque at the origin of the left ICA  without significant stenosis. Vertical and petrous segment patent with normal course caliber contour. Cavernous segment patent. Clinoid segment patent. Antegrade flow of the ophthalmic artery. Ophthalmic segment patent. Terminus patent. Left MCA: M1 segment patent. Insular and opercular segments patent. Unremarkable caliber and course of the cortical segments. Typical arterial, capillary/ parenchymal, and venous phase. Left ACA: A 1 segment patent. A 2 segment perfuses the right ACA territory. Flat panel CT: Small volume subarachnoid hemorrhage within the right sulci, [place] IIIC PROCEDURE: The anesthesia team was present to provide general endotracheal tube anesthesia and for patient monitoring during the procedure. Intubation was performed in negative pressure [place] in neuro IR holding. Interventional neuro radiology nursing staff was also present. Ultrasound survey of the right inguinal region was performed with images stored and sent to PACs. 11 blade scalpel was used to make a small incision. Blunt dissection was performed with US guidance. A micropuncture needle was used access the right common femoral artery under ultrasound. With excellent arterial blood flow returned, an .018 micro wire was passed through the needle, observed to enter the abdominal aorta under fluoroscopy. The needle was removed, and a micropuncture sheath was placed over the wire. The inner dilator and  wire were removed, and an 035 wire was advanced under fluoroscopy into the abdominal aorta. The sheath was removed and a 25cm 55F straight vascular sheath was placed. The dilator was removed and the sheath was flushed. Sheath was attached to pressurized and heparinized saline bag for constant forward flow. A coaxial system was then advanced over the 035 wire. This included a 95cm 087 "Walrus" balloon guide with coaxial 125cm Berenstein diagnostic catheter. This was advanced to the proximal descending thoracic aorta. Wire was then removed.  Double flush of the catheter was performed. Catheter was then used to select the innominate artery. Angiogram was performed. Using roadmap technique, the catheter was advanced over a standard glide wire into right cervical ICA, with distal position achieved of the balloon guide. The diagnostic catheter and the wire were removed. Formal angiogram was performed. Road map function was used once the occluded vessel was identified. Copious back flush was performed and the balloon catheter was attached to heparinized and pressurized saline bag for forward flow. A second coaxial system was then advanced through the balloon catheter, which included the selected intermediate catheter, microcatheter, and microwire. In this scenario, the set up included a zoom 71 aspiration catheter, a Trevo Provue18 microcatheter, and 014 synchro soft wire. This system was advanced through the balloon guide catheter under the road-map function, with adequate back-flush at the rotating hemostatic valve at that back end of the balloon guide. Once the wire and microcatheter were into the laceral segment, we attach heparinized saline pressured flush to the guide. Microcatheter and the intermediate catheter system were advanced through the terminal ICA and MCA gently to the proximal M1 segment, and were advanced up to but not through the occluded segment. The zoom catheter was then advanced over the microwire into the proximal M1 segment. The microcatheter microwire were then removed. The proprietary engine was attached to the hub of the aspiration catheter confirming return of flow. Catheter was then gently advanced into the embolus/thrombus at the site of occlusion. Approximately 1 minute of time was observed and then the catheter was withdrawn. Catheter was withdrawn into the balloon guide with free aspiration performed at the hub of the aspiration catheter. Angiogram was performed, which confirmed restoration of flow through the proximal  inferior segment, with distal migration of the thrombus into a distal branch. We elected to attempt a second pass. 2nd pass: Coaxial system was advanced through the balloon guide, which included a zoom 71 aspiration catheter, a Trevo Provue18 microcatheter, and 014 synchro soft wire. The system was advanced through the balloon guide catheter under the road-map function, with adequate back-flush at the rotating hemostatic valve at that back end of the balloon guide. Microcatheter and the intermediate catheter system were advanced through the terminal ICA and MCA to the level of the bifurcation of superior/inferior division. The micro wire was then carefully advanced into the inferior segment and through the occluded segment. Microcatheter was then manipulated through the occluded segment and the wire was removed with saline drip at the hub. Blood was then aspirated through the hub of the microcatheter, and a gentle contrast injection was performed confirming intraluminal position within the inferior division. A rotating hemostatic valve was then attached to the back end of the microcatheter, and a pressurized and heparinized saline bag was attached to the catheter. 4 x 40 solitaire device was then selected. Back flush was achieved at the rotating hemostatic valve, and then the device was gently advanced through the microcatheter to the distal end. The  retriever was then unsheathed by withdrawing the microcatheter under fluoroscopy. Once the retriever was completely unsheathed, the microcatheter was carefully stripped from the delivery device. Control angiogram was performed from the intermediate catheter. A 3 minute time interval was observed. Constant aspiration using the proprietary engine was then performed at the intermediate catheter, as the retriever was gently and slowly withdrawn with fluoroscopic observation. Once the retriever was "corked" within the tip of the intermediate catheter, both were removed from  the system. Free aspiration was confirmed at the hub of the balloon guide catheter, with free blood return confirmed. The balloon was then deflated, and a control angiogram was performed. Improved flow, with again distal migration within the inferior division. We elected to attempt a third pass. 3rd pass: The 014 synchro soft wire was loaded through a zoom 35 aspiration catheter. Roadmap angiogram was performed. The aspiration catheter and the microwire were then advanced to the inferior segment, just proximal to the occlusion site. Once the aspiration catheter was just proximal to the occlusion site the microwire was removed. The rotating hemostatic valve was then removed from the hub, and the proprietary engine was attached directly to the hub of the zoom 35 catheter the pump was turned on confirming backflow and the catheter was advanced until cessation of flow was observed. Catheter was advanced through the site of the occlusion and then withdrawn. Upon withdrawal, aspiration was performed at the hub of the balloon guide catheter. Once the aspiration catheter was completely removed, aspiration was performed at the hub of the balloon guide confirming free return of blood. Angiogram was performed. There was improved flow through the site of the occlusion, however, again distal migration was observed within the affected division, with again distal migration within the inferior division. We elected 1 final pass into the inferior division M3/M4 with the microcatheter. Fourth pass: A roadmap angiogram was performed. Through the balloon guide, a 160 cm Trevo Trak microcatheter was advanced with the synchro soft, into the distal aspect of the inferior division, targeting the residual, most proximal occlusive embolus of the inferior division branches. Once the microcatheter was distal to the site of the occlusion, the microwire was removed. Catheter aspiration was performed as the catheter was then withdrawn. Catheter  was completely withdrawn from the system. Angiogram performed demonstrated persistent occlusion in the distal M3/M4 cortical branch of the inferior division. At this time we elected to terminate the case. Balloon guide was withdrawn. A JB 1 catheter was used to select the origin of the left CCA. Angiogram was performed. JB 1 catheter was then removed. The skin at the puncture site was then cleaned with Chlorhexidine. The 8 French sheath was removed and an 19F angioseal was deployed. Flat panel CT was performed. Patient was extubated once the CT was reviewed. Patient tolerated the procedure well and remained hemodynamically stable throughout. No complications were encountered and no significant blood loss encountered. IMPRESSION: Status post ultrasound guided access right common femoral artery for mechanical thrombectomy of right-sided MCA ELVO, with 4 passes achieving TICI 2c flow at the completion. Angiogram of the left ICA demonstrates no occlusive branches intracranial. Angio-Seal restored for hemostasis. Signed, Dulcy Fanny. Dellia Nims, RPVI [facility] Radiology PLAN: Patient extubated ICU status Target systolic blood pressure of 120-140 Right hip straight time 4 hours Frequent neurovascular checks Repeat neurologic imaging with CT and/MRI at the discretion of neurology team Electronically Signed   By: Corrie Mckusick D.O.   On: 12/18/2020 21:30  Assessment and Plan:   LV thrombus on TTE 1.2 X 0.5 cm non mobile.  Found in region of akinesis involving apical septal wall.  Not present on echoes 2021 and 09/2020.  Also neg nuc study 09/2020.   No hs troponins drawn. ? MI since July.   Rt MCA infarct due to Rt M1 Occlusion with IR with TICI2c revascularization, embolic pattern secondary to LV thrombus. Was on ASA and plavix daily prior to admit.  Now on IV heparin with need for anticoagulation due to thrombus. Continues to improve.  Loop recorder no a fib seen. Hx  of CVAs per neuro Hx of CAD with prior stents to LAD in 2015 and nuc study neg for ischemia in 09/2020. HLD on statin.lipitor 80 and zetia 10  LDL 49 at this point goal <55.  DM-2 per IM. Neuro.       Risk Assessment/Risk Scores:                For questions or updates, please contact [place] Please consult [email or web address] for contact info under    Signed, Cecilie Kicks, NP  12/19/2020 11:19 AM

## 2020-12-19 NOTE — Progress Notes (Signed)
STROKE TEAM PROGRESS NOTE   SUBJECTIVE (INTERVAL HISTORY) His wife and RN are at the bedside. His aphasia continues to improve. Cardiac MRI confirmed LV apical thrombus. Will need cardiology consult. Continue heparin IV.   OBJECTIVE Temp:  [97.6 F (36.4 C)-98.8 F (37.1 C)] 98.8 F (37.1 C) (09/19 0800) Pulse Rate:  [65-71] 65 (09/19 0800) Resp:  [13-22] 21 (09/19 0900) BP: (111-147)/(66-90) 147/78 (09/19 0800) SpO2:  [97 %-99 %] 99 % (09/19 0800)  Recent Labs  Lab 12/18/20 0815 12/18/20 1147 12/18/20 1627 12/18/20 1959 12/19/20 0805  GLUCAP 110* 176* 213* 220* 149*   Recent Labs  Lab 12/17/20 0506 12/18/20 0322 12/19/20 0623  NA 138 140 139  K 3.9 4.2 3.8  CL 107 106 108  CO2 24 21* 23  GLUCOSE 188* 101* 138*  BUN '19 18 20  '$ CREATININE 1.34* 1.34* 1.28*  CALCIUM 9.3 9.1 9.0   Recent Labs  Lab 12/17/20 0506  AST 25  ALT 24  ALKPHOS 50  BILITOT 0.5  PROT 6.4*  ALBUMIN 3.9   Recent Labs  Lab 12/17/20 0506 12/18/20 0322 12/19/20 0623  WBC 10.7* 13.6* 10.7*  NEUTROABS 6.6  --   --   HGB 14.6 14.7 13.4  HCT 42.6 44.1 39.4  MCV 93.8 97.8 95.9  PLT 185 175 145*   No results for input(s): CKTOTAL, CKMB, CKMBINDEX, TROPONINI in the last 168 hours. Recent Labs    12/17/20 0506  LABPROT 12.6  INR 0.9   No results for input(s): COLORURINE, LABSPEC, PHURINE, GLUCOSEU, HGBUR, BILIRUBINUR, KETONESUR, PROTEINUR, UROBILINOGEN, NITRITE, LEUKOCYTESUR in the last 72 hours.  Invalid input(s): APPERANCEUR     Component Value Date/Time   CHOL 91 12/18/2020 0322   CHOL 109 09/22/2013 0908   TRIG 74 12/18/2020 0322   TRIG 99 09/22/2013 0908   HDL 27 (L) 12/18/2020 0322   HDL 25 (L) 09/22/2013 0908   CHOLHDL 3.4 12/18/2020 0322   VLDL 15 12/18/2020 0322   VLDL 20 09/22/2013 0908   LDLCALC 49 12/18/2020 0322   LDLCALC 71 08/22/2017 1005   LDLCALC 64 09/22/2013 0908   Lab Results  Component Value Date   HGBA1C 6.7 (H) 12/18/2020      Component Value  Date/Time   LABOPIA NONE DETECTED 07/31/2019 2330   COCAINSCRNUR NONE DETECTED 07/31/2019 2330   LABBENZ NONE DETECTED 07/31/2019 2330   AMPHETMU NONE DETECTED 07/31/2019 2330   THCU NONE DETECTED 07/31/2019 2330   LABBARB NONE DETECTED 07/31/2019 2330    No results for input(s): ETH in the last 168 hours.  I have personally reviewed the radiological images below and agree with the radiology interpretations.  CT Angio Head W or Wo Contrast  Result Date: 12/17/2020 CLINICAL DATA:  Left-sided weakness EXAM: CT ANGIOGRAPHY HEAD AND NECK TECHNIQUE: Multidetector CT imaging of the head and neck was performed using the standard protocol during bolus administration of intravenous contrast. Multiplanar CT image reconstructions and MIPs were obtained to evaluate the vascular anatomy. Carotid stenosis measurements (when applicable) are obtained utilizing NASCET criteria, using the distal internal carotid diameter as the denominator. CONTRAST:  82m OMNIPAQUE IOHEXOL 350 MG/ML SOLN COMPARISON:  07/31/2019 FINDINGS: CTA NECK FINDINGS Aortic arch: Standard branching. Imaged portion shows no evidence of aneurysm or dissection. No significant stenosis of the major arch vessel origins. Right carotid system: Mixed density plaque at the bifurcation without flow limiting stenosis or ulceration. Left carotid system: Mixed density plaque at the bifurcation without flow limiting stenosis or ulceration. Vertebral arteries: No  proximal subclavian stenosis. Strong left vertebral artery dominance. Skeleton: Degenerative endplate and facet spurring. Other neck: Negative Upper chest: Coronary calcification. Review of the MIP images confirms the above findings CTA HEAD FINDINGS Anterior circulation: Atheromatous calcification affecting the carotid siphons. Abrupt cut off at the distal right M1 segment with some downstream branch reconstitution. Negative for aneurysm Posterior circulation: Basilar is fed solely by the left  vertebral artery. Fetal type bilateral PCA flow. No branch occlusion, beading, or aneurysm. Venous sinuses: Unremarkable for the arterial phase Anatomic variants: As above Review of the MIP images confirms the above findings Critical Value/emergent results were called by telephone at the time of interpretation on 12/17/2020 at 5:24 am to provider [facility] , who verbally acknowledged these results. IMPRESSION: 1. Emergent large vessel occlusion at the right M1 segment. 2. Nonobstructive atherosclerosis of the cervical and intracranial carotids. Electronically Signed   By: Jorje Guild M.D.   On: 12/17/2020 05:30   CT Angio Neck W and/or Wo Contrast  Result Date: 12/17/2020 CLINICAL DATA:  Left-sided weakness EXAM: CT ANGIOGRAPHY HEAD AND NECK TECHNIQUE: Multidetector CT imaging of the head and neck was performed using the standard protocol during bolus administration of intravenous contrast. Multiplanar CT image reconstructions and MIPs were obtained to evaluate the vascular anatomy. Carotid stenosis measurements (when applicable) are obtained utilizing NASCET criteria, using the distal internal carotid diameter as the denominator. CONTRAST:  61m OMNIPAQUE IOHEXOL 350 MG/ML SOLN COMPARISON:  07/31/2019 FINDINGS: CTA NECK FINDINGS Aortic arch: Standard branching. Imaged portion shows no evidence of aneurysm or dissection. No significant stenosis of the major arch vessel origins. Right carotid system: Mixed density plaque at the bifurcation without flow limiting stenosis or ulceration. Left carotid system: Mixed density plaque at the bifurcation without flow limiting stenosis or ulceration. Vertebral arteries: No proximal subclavian stenosis. Strong left vertebral artery dominance. Skeleton: Degenerative endplate and facet spurring. Other neck: Negative Upper chest: Coronary calcification. Review of the MIP images confirms the above findings CTA HEAD FINDINGS Anterior circulation: Atheromatous calcification  affecting the carotid siphons. Abrupt cut off at the distal right M1 segment with some downstream branch reconstitution. Negative for aneurysm Posterior circulation: Basilar is fed solely by the left vertebral artery. Fetal type bilateral PCA flow. No branch occlusion, beading, or aneurysm. Venous sinuses: Unremarkable for the arterial phase Anatomic variants: As above Review of the MIP images confirms the above findings Critical Value/emergent results were called by telephone at the time of interpretation on 12/17/2020 at 5:24 am [facility], who verbally acknowledged these results. IMPRESSION: 1. Emergent large vessel occlusion at the right M1 segment. 2. Nonobstructive atherosclerosis of the cervical and intracranial carotids. Electronically Signed   By: JJorje GuildM.D.   On: 12/17/2020 05:30   MR ANGIO HEAD WO CONTRAST  Result Date: 12/17/2020 CLINICAL DATA:  Stroke follow-up. Status post thrombectomy for treatment of M1 occlusion. EXAM: MRI HEAD WITHOUT CONTRAST MRA HEAD WITHOUT CONTRAST TECHNIQUE: Multiplanar, multi-echo pulse sequences of the brain and surrounding structures were acquired without intravenous contrast. Angiographic images of the Circle of Willis were acquired using MRA technique without intravenous contrast. COMPARISON:  Head CT and CTA 12/17/2020.  Head MRI 08/01/2019. FINDINGS: MRI HEAD FINDINGS Brain: There is a small to moderate-sized acute right MCA infarct involving portions of the temporal, frontal, and parietal lobes and insula with associated petechial hemorrhage. A small chronic right frontal cortical infarct is again noted, and there are chronic bilateral cerebellar infarcts with associated chronic blood products. The ventricles  are normal in size. No mass, midline shift, or extra-axial fluid collection is identified. Vascular: More fully evaluated below. Skull and upper cervical spine: Unremarkable bone marrow signal. Sinuses/Orbits: Unremarkable orbits. Mild  mucosal thickening in the paranasal sinuses. Small right and trace left mastoid effusions. Other: None. MRA HEAD FINDINGS Anterior circulation: The internal carotid arteries are widely patent from skull base to carotid termini. ACAs and MCAs are patent without evidence of a proximal branch occlusion or significant proximal stenosis. Specifically, the recanalized right M1 segment remains patent without an underlying stenosis. No aneurysm is identified. Posterior circulation: The included intracranial portion of the left vertebral artery is widely patent and supplies the basilar. The right vertebral artery is not visualized and was shown to be hypoplastic on the earlier CTA. The basilar artery is widely patent. There is a large right posterior communicating artery. 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 No significant proximal PCA stenosis is evident. No aneurysm is identified. Anatomic variants: As above. IMPRESSION: 1. Small to moderate-sized acute right MCA infarct. 2. Chronic right frontal and bilateral cerebellar infarcts. 3. Negative head MRA. Electronically Signed   By: Logan Bores M.D.   On: 12/17/2020 14:55   MR BRAIN WO CONTRAST  Result Date: 12/17/2020 CLINICAL DATA:  Stroke follow-up. Status post thrombectomy for treatment of M1 occlusion. EXAM: MRI HEAD WITHOUT CONTRAST MRA HEAD WITHOUT CONTRAST TECHNIQUE: Multiplanar, multi-echo pulse sequences of the brain and surrounding structures were acquired without intravenous contrast. Angiographic images of the Circle of Willis were acquired using MRA technique without intravenous contrast. COMPARISON:  Head CT and CTA 12/17/2020.  Head MRI 08/01/2019. FINDINGS: MRI HEAD FINDINGS Brain: There is a small to moderate-sized acute right MCA infarct involving portions of the temporal, frontal, and parietal lobes and insula  with associated petechial hemorrhage. A small chronic right frontal cortical infarct is again noted, and there are chronic bilateral cerebellar infarcts with associated chronic blood products. The ventricles are normal in size. No mass, midline shift, or extra-axial fluid collection is identified. Vascular: More fully evaluated below. Skull and upper cervical spine: Unremarkable bone marrow signal. Sinuses/Orbits: Unremarkable orbits. Mild mucosal thickening in the paranasal sinuses. Small right and trace left mastoid effusions. Other: None. MRA HEAD FINDINGS Anterior circulation: The internal carotid arteries are widely patent from skull base to carotid termini. ACAs and MCAs are patent without evidence of a proximal branch occlusion or significant proximal stenosis. Specifically, the recanalized right M1 segment remains patent without an underlying stenosis. No aneurysm is identified. Posterior circulation: The included intracranial portion of the left vertebral artery is widely patent and supplies the basilar. The right vertebral artery is not visualized and was shown to be hypoplastic on the earlier CTA. The basilar artery is widely patent. There is a large right posterior communicating artery. 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 No significant proximal PCA stenosis is evident. No aneurysm is identified. Anatomic variants: As above. IMPRESSION: 1. Small to moderate-sized acute right MCA infarct. 2. Chronic right frontal and bilateral cerebellar infarcts. 3. Negative head MRA. Electronically Signed   By: Logan Bores M.D.   On: 12/17/2020 14:55   IR CT Head Ltd  Result Date: 12/19/2020 INDICATION: 60 year old male with a history acute stroke right M1/right MCA syndrome,  presents for angiogram and possible mechanical thrombectomy EXAM: ULTRASOUND-GUIDED ACCESS RIGHT  COMMON FEMORAL ARTERY CERVICAL AND CEREBRAL ANGIOGRAM MECHANICAL THROMBECTOMY RIGHT MCA FLAT PANEL CT ANGIO-SEAL FOR HEMOSTASIS COMPARISON:  CT IMAGING SAME DAY MEDICATIONS: None ANESTHESIA/SEDATION: The anesthesia team was present to provide general endotracheal tube anesthesia and for patient monitoring during the procedure. Intubation was performed in room 2 neuro IR. Left radial arterial line was performed by the anesthesia team. Interventional neuro radiology nursing staff was also present. CONTRAST:  90 cc Omni 240 FLUOROSCOPY TIME:  Fluoroscopy Time: 19 minutes 6 seconds (1588 mGy). COMPLICATIONS: SIR LEVEL B - Normal therapy, includes overnight admission for observation. Subarachnoid hemorrhage TECHNIQUE: Informed written consent was obtained from the patient's family after a thorough discussion of the procedural risks, benefits and alternatives. Specific risks discussed include: Bleeding, infection, contrast reaction, kidney injury/failure, need for further procedure/surgery, arterial injury or dissection, embolization to new territory, intracranial hemorrhage (10-15% risk), neurologic deterioration, cardiopulmonary collapse, death. All questions were addressed. Maximal Sterile Barrier Technique was utilized including during the procedure including caps, mask, sterile gowns, sterile gloves, sterile drape, hand hygiene and skin antiseptic. A timeout was performed prior to the initiation of the procedure. The anesthesia team was present to provide general endotracheal tube anesthesia and for patient monitoring during the procedure. Interventional neuro radiology nursing staff was also present. FINDINGS: Initial Findings: Right common carotid artery:  Normal course caliber and contour. Right external carotid artery: Patent with antegrade flow. Right internal carotid artery: Normal course caliber and contour of the cervical portion. Vertical and petrous segment patent with normal course caliber contour.  Cavernous segment patent. Clinoid segment patent. Antegrade flow of the ophthalmic artery. Ophthalmic segment patent. Terminus patent. Right MCA: Occlusion was present at the branch point of superior and inferior. There had been migration of the M1 thrombus distally to the branch point, when compared to the baseline CT angiogram. The embolus had moved primarily into the inferior division, with flow maintained into the superior division on the initial angiogram. Right ACA: A 1 segment patent. Full perfusion of the right ACA territory with crossover flow into the left ACA territory via patent anterior communicating artery. Completion Findings: Right MCA: Restoration of flow through the right MCA, into the inferior and superior division branch point. The fragmented embolus entered the inferior division. The inferior division is the dominant division, with perfusion of the temporal lobe and the parietal lobe, with the superior segment perfusing essentially the frontal region only. After treatment, there is restoration of the proximal flow, with slow flow within distal branches of the inferior division, affecting both parietal and temporal lobes, with TICI 2c flow: Near complete perfusion except for slow flow in a few distal cortical vessels/presence of small distal cortical emboli Left: Left common carotid artery:  Normal course caliber and contour. Left external carotid artery: Patent with antegrade flow. Left internal carotid artery: Normal course caliber and contour of the cervical portion. Mild atherosclerotic plaque at the origin of the left ICA without significant stenosis. Vertical and petrous segment patent with normal course caliber contour. Cavernous segment patent. Clinoid segment patent. Antegrade flow of the ophthalmic artery. Ophthalmic segment patent. Terminus patent. Left MCA: M1 segment patent. Insular and opercular segments patent. Unremarkable caliber and course of the cortical segments. Typical  arterial, capillary/ parenchymal, and venous phase. Left ACA: A 1 segment patent. A 2 segment perfuses the right ACA territory. Flat panel CT: Small volume subarachnoid hemorrhage within the right sulci, [place] IIIC PROCEDURE: The anesthesia team  was present to provide general endotracheal tube anesthesia and for patient monitoring during the procedure. Intubation was performed in negative pressure [place] in neuro IR holding. Interventional neuro radiology nursing staff was also present. Ultrasound survey of the right inguinal region was performed with images stored and sent to PACs. 11 blade scalpel was used to make a small incision. Blunt dissection was performed with US guidance. A micropuncture needle was used access the right common femoral artery under ultrasound. With excellent arterial blood flow returned, an .018 micro wire was passed through the needle, observed to enter the abdominal aorta under fluoroscopy. The needle was removed, and a micropuncture sheath was placed over the wire. The inner dilator and wire were removed, and an 035 wire was advanced under fluoroscopy into the abdominal aorta. The sheath was removed and a 25cm 59F straight vascular sheath was placed. The dilator was removed and the sheath was flushed. Sheath was attached to pressurized and heparinized saline bag for constant forward flow. A coaxial system was then advanced over the 035 wire. This included a 95cm 087 "Walrus" balloon guide with coaxial 125cm Berenstein diagnostic catheter. This was advanced to the proximal descending thoracic aorta. Wire was then removed. Double flush of the catheter was performed. Catheter was then used to select the innominate artery. Angiogram was performed. Using roadmap technique, the catheter was advanced over a standard glide wire into right cervical ICA, with distal position achieved of the balloon guide. The diagnostic catheter and the wire were removed. Formal angiogram was performed. Road map  function was used once the occluded vessel was identified. Copious back flush was performed and the balloon catheter was attached to heparinized and pressurized saline bag for forward flow. A second coaxial system was then advanced through the balloon catheter, which included the selected intermediate catheter, microcatheter, and microwire. In this scenario, the set up included a zoom 71 aspiration catheter, a Trevo Provue18 microcatheter, and 014 synchro soft wire. This system was advanced through the balloon guide catheter under the road-map function, with adequate back-flush at the rotating hemostatic valve at that back end of the balloon guide. Once the wire and microcatheter were into the laceral segment, we attach heparinized saline pressured flush to the guide. Microcatheter and the intermediate catheter system were advanced through the terminal ICA and MCA gently to the proximal M1 segment, and were advanced up to but not through the occluded segment. The zoom catheter was then advanced over the microwire into the proximal M1 segment. The microcatheter microwire were then removed. The proprietary engine was attached to the hub of the aspiration catheter confirming return of flow. Catheter was then gently advanced into the embolus/thrombus at the site of occlusion. Approximately 1 minute of time was observed and then the catheter was withdrawn. Catheter was withdrawn into the balloon guide with free aspiration performed at the hub of the aspiration catheter. Angiogram was performed, which confirmed restoration of flow through the proximal inferior segment, with distal migration of the thrombus into a distal branch. We elected to attempt a second pass. 2nd pass: Coaxial system was advanced through the balloon guide, which included a zoom 71 aspiration catheter, a Trevo Provue18 microcatheter, and 014 synchro soft wire. The system was advanced through the balloon guide catheter under the road-map function, with  adequate back-flush at the rotating hemostatic valve at that back end of the balloon guide. Microcatheter and the intermediate catheter system were advanced through the terminal ICA and MCA to the level of the bifurcation  of superior/inferior division. The micro wire was then carefully advanced into the inferior segment and through the occluded segment. Microcatheter was then manipulated through the occluded segment and the wire was removed with saline drip at the hub. Blood was then aspirated through the hub of the microcatheter, and a gentle contrast injection was performed confirming intraluminal position within the inferior division. A rotating hemostatic valve was then attached to the back end of the microcatheter, and a pressurized and heparinized saline bag was attached to the catheter. 4 x 40 solitaire device was then selected. Back flush was achieved at the rotating hemostatic valve, and then the device was gently advanced through the microcatheter to the distal end. The retriever was then unsheathed by withdrawing the microcatheter under fluoroscopy. Once the retriever was completely unsheathed, the microcatheter was carefully stripped from the delivery device. Control angiogram was performed from the intermediate catheter. A 3 minute time interval was observed. Constant aspiration using the proprietary engine was then performed at the intermediate catheter, as the retriever was gently and slowly withdrawn with fluoroscopic observation. Once the retriever was "corked" within the tip of the intermediate catheter, both were removed from the system. Free aspiration was confirmed at the hub of the balloon guide catheter, with free blood return confirmed. The balloon was then deflated, and a control angiogram was performed. Improved flow, with again distal migration within the inferior division. We elected to attempt a third pass. 3rd pass: The 014 synchro soft wire was loaded through a zoom 35 aspiration  catheter. Roadmap angiogram was performed. The aspiration catheter and the microwire were then advanced to the inferior segment, just proximal to the occlusion site. Once the aspiration catheter was just proximal to the occlusion site the microwire was removed. The rotating hemostatic valve was then removed from the hub, and the proprietary engine was attached directly to the hub of the zoom 35 catheter the pump was turned on confirming backflow and the catheter was advanced until cessation of flow was observed. Catheter was advanced through the site of the occlusion and then withdrawn. Upon withdrawal, aspiration was performed at the hub of the balloon guide catheter. Once the aspiration catheter was completely removed, aspiration was performed at the hub of the balloon guide confirming free return of blood. Angiogram was performed. There was improved flow through the site of the occlusion, however, again distal migration was observed within the affected division, with again distal migration within the inferior division. We elected 1 final pass into the inferior division M3/M4 with the microcatheter. Fourth pass: A roadmap angiogram was performed. Through the balloon guide, a 160 cm Trevo Trak microcatheter was advanced with the synchro soft, into the distal aspect of the inferior division, targeting the residual, most proximal occlusive embolus of the inferior division branches. Once the microcatheter was distal to the site of the occlusion, the microwire was removed. Catheter aspiration was performed as the catheter was then withdrawn. Catheter was completely withdrawn from the system. Angiogram performed demonstrated persistent occlusion in the distal M3/M4 cortical branch of the inferior division. At this time we elected to terminate the case. Balloon guide was withdrawn. A JB 1 catheter was used to select the origin of the left CCA. Angiogram was performed. JB 1 catheter was then removed. The skin at the  puncture site was then cleaned with Chlorhexidine. The 8 French sheath was removed and an 20F angioseal was deployed. Flat panel CT was performed. Patient was extubated once the CT was reviewed. Patient tolerated  the procedure well and remained hemodynamically stable throughout. No complications were encountered and no significant blood loss encountered. IMPRESSION: Status post ultrasound guided access right common femoral artery for mechanical thrombectomy of right-sided MCA ELVO, with 4 passes achieving TICI 2c flow at the completion. Angiogram of the left ICA demonstrates no occlusive branches intracranial. Angio-Seal restored for hemostasis. Signed, Dulcy Fanny. Dellia Nims, RPVI [facility] Radiology [facility] Radiology PLAN: Patient extubated ICU status Target systolic blood pressure of 120-140 Right hip straight time 4 hours Frequent neurovascular checks Repeat neurologic imaging with CT and/MRI at the discretion of neurology team Electronically Signed   By: Corrie Mckusick D.O.   On: 12/18/2020 21:30   IR US Guide Vasc Access Right  Result Date: 12/19/2020 INDICATION: 60 year old male with a history acute stroke right M1/right MCA syndrome, presents for angiogram and possible mechanical thrombectomy EXAM: ULTRASOUND-GUIDED ACCESS RIGHT COMMON FEMORAL ARTERY CERVICAL AND CEREBRAL ANGIOGRAM MECHANICAL THROMBECTOMY RIGHT MCA FLAT PANEL CT ANGIO-SEAL FOR HEMOSTASIS COMPARISON:  CT IMAGING SAME DAY MEDICATIONS: None ANESTHESIA/SEDATION: The anesthesia team was present to provide general endotracheal tube anesthesia and for patient monitoring during the procedure. Intubation was performed in room 2 neuro IR. Left radial arterial line was performed by the anesthesia team. Interventional neuro radiology nursing staff was also present. CONTRAST:  90 cc Omni 240 FLUOROSCOPY TIME:  Fluoroscopy Time: 19 minutes 6 seconds (1588 mGy). COMPLICATIONS: SIR LEVEL B - Normal therapy, includes overnight  admission for observation. Subarachnoid hemorrhage TECHNIQUE: Informed written consent was obtained from the patient's family after a thorough discussion of the procedural risks, benefits and alternatives. Specific risks discussed include: Bleeding, infection, contrast reaction, kidney injury/failure, need for further procedure/surgery, arterial injury or dissection, embolization to new territory, intracranial hemorrhage (10-15% risk), neurologic deterioration, cardiopulmonary collapse, death. All questions were addressed. Maximal Sterile Barrier Technique was utilized including during the procedure including caps, mask, sterile gowns, sterile gloves, sterile drape, hand hygiene and skin antiseptic. A timeout was performed prior to the initiation of the procedure. The anesthesia team was present to provide general endotracheal tube anesthesia and for patient monitoring during the procedure. Interventional neuro radiology nursing staff was also present. FINDINGS: Initial Findings: Right common carotid artery:  Normal course caliber and contour. Right external carotid artery: Patent with antegrade flow. Right internal carotid artery: Normal course caliber and contour of the cervical portion. Vertical and petrous segment patent with normal course caliber contour. Cavernous segment patent. Clinoid segment patent. Antegrade flow of the ophthalmic artery. Ophthalmic segment patent. Terminus patent. Right MCA: Occlusion was present at the branch point of superior and inferior. There had been migration of the M1 thrombus distally to the branch point, when compared to the baseline CT angiogram. The embolus had moved primarily into the inferior division, with flow maintained into the superior division on the initial angiogram. Right ACA: A 1 segment patent. Full perfusion of the right ACA territory with crossover flow into the left ACA territory via patent anterior communicating artery. Completion Findings: Right MCA:  Restoration of flow through the right MCA, into the inferior and superior division branch point. The fragmented embolus entered the inferior division. The inferior division is the dominant division, with perfusion of the temporal lobe and the parietal lobe, with the superior segment perfusing essentially the frontal region only. After treatment, there is restoration of the proximal flow, with slow flow within distal branches of the inferior division, affecting both parietal and temporal lobes, with TICI 2c flow: Near complete perfusion except for  slow flow in a few distal cortical vessels/presence of small distal cortical emboli Left: Left common carotid artery:  Normal course caliber and contour. Left external carotid artery: Patent with antegrade flow. Left internal carotid artery: Normal course caliber and contour of the cervical portion. Mild atherosclerotic plaque at the origin of the left ICA without significant stenosis. Vertical and petrous segment patent with normal course caliber contour. Cavernous segment patent. Clinoid segment patent. Antegrade flow of the ophthalmic artery. Ophthalmic segment patent. Terminus patent. Left MCA: M1 segment patent. Insular and opercular segments patent. Unremarkable caliber and course of the cortical segments. Typical arterial, capillary/ parenchymal, and venous phase. Left ACA: A 1 segment patent. A 2 segment perfuses the right ACA territory. Flat panel CT: Small volume subarachnoid hemorrhage within the right sulci, [place] IIIC PROCEDURE: The anesthesia team was present to provide general endotracheal tube anesthesia and for patient monitoring during the procedure. Intubation was performed in negative pressure [place] in neuro IR holding. Interventional neuro radiology nursing staff was also present. Ultrasound survey of the right inguinal region was performed with images stored and sent to PACs. 11 blade scalpel was used to make a small incision. Blunt dissection was  performed with US guidance. A micropuncture needle was used access the right common femoral artery under ultrasound. With excellent arterial blood flow returned, an .018 micro wire was passed through the needle, observed to enter the abdominal aorta under fluoroscopy. The needle was removed, and a micropuncture sheath was placed over the wire. The inner dilator and wire were removed, and an 035 wire was advanced under fluoroscopy into the abdominal aorta. The sheath was removed and a 25cm 51F straight vascular sheath was placed. The dilator was removed and the sheath was flushed. Sheath was attached to pressurized and heparinized saline bag for constant forward flow. A coaxial system was then advanced over the 035 wire. This included a 95cm 087 "Walrus" balloon guide with coaxial 125cm Berenstein diagnostic catheter. This was advanced to the proximal descending thoracic aorta. Wire was then removed. Double flush of the catheter was performed. Catheter was then used to select the innominate artery. Angiogram was performed. Using roadmap technique, the catheter was advanced over a standard glide wire into right cervical ICA, with distal position achieved of the balloon guide. The diagnostic catheter and the wire were removed. Formal angiogram was performed. Road map function was used once the occluded vessel was identified. Copious back flush was performed and the balloon catheter was attached to heparinized and pressurized saline bag for forward flow. A second coaxial system was then advanced through the balloon catheter, which included the selected intermediate catheter, microcatheter, and microwire. In this scenario, the set up included a zoom 71 aspiration catheter, a Trevo Provue18 microcatheter, and 014 synchro soft wire. This system was advanced through the balloon guide catheter under the road-map function, with adequate back-flush at the rotating hemostatic valve at that back end of the balloon guide. Once the  wire and microcatheter were into the laceral segment, we attach heparinized saline pressured flush to the guide. Microcatheter and the intermediate catheter system were advanced through the terminal ICA and MCA gently to the proximal M1 segment, and were advanced up to but not through the occluded segment. The zoom catheter was then advanced over the microwire into the proximal M1 segment. The microcatheter microwire were then removed. The proprietary engine was attached to the hub of the aspiration catheter confirming return of flow. Catheter was then gently advanced into the embolus/thrombus  at the site of occlusion. Approximately 1 minute of time was observed and then the catheter was withdrawn. Catheter was withdrawn into the balloon guide with free aspiration performed at the hub of the aspiration catheter. Angiogram was performed, which confirmed restoration of flow through the proximal inferior segment, with distal migration of the thrombus into a distal branch. We elected to attempt a second pass. 2nd pass: Coaxial system was advanced through the balloon guide, which included a zoom 71 aspiration catheter, a Trevo Provue18 microcatheter, and 014 synchro soft wire. The system was advanced through the balloon guide catheter under the road-map function, with adequate back-flush at the rotating hemostatic valve at that back end of the balloon guide. Microcatheter and the intermediate catheter system were advanced through the terminal ICA and MCA to the level of the bifurcation of superior/inferior division. The micro wire was then carefully advanced into the inferior segment and through the occluded segment. Microcatheter was then manipulated through the occluded segment and the wire was removed with saline drip at the hub. Blood was then aspirated through the hub of the microcatheter, and a gentle contrast injection was performed confirming intraluminal position within the inferior division. A rotating  hemostatic valve was then attached to the back end of the microcatheter, and a pressurized and heparinized saline bag was attached to the catheter. 4 x 40 solitaire device was then selected. Back flush was achieved at the rotating hemostatic valve, and then the device was gently advanced through the microcatheter to the distal end. The retriever was then unsheathed by withdrawing the microcatheter under fluoroscopy. Once the retriever was completely unsheathed, the microcatheter was carefully stripped from the delivery device. Control angiogram was performed from the intermediate catheter. A 3 minute time interval was observed. Constant aspiration using the proprietary engine was then performed at the intermediate catheter, as the retriever was gently and slowly withdrawn with fluoroscopic observation. Once the retriever was "corked" within the tip of the intermediate catheter, both were removed from the system. Free aspiration was confirmed at the hub of the balloon guide catheter, with free blood return confirmed. The balloon was then deflated, and a control angiogram was performed. Improved flow, with again distal migration within the inferior division. We elected to attempt a third pass. 3rd pass: The 014 synchro soft wire was loaded through a zoom 35 aspiration catheter. Roadmap angiogram was performed. The aspiration catheter and the microwire were then advanced to the inferior segment, just proximal to the occlusion site. Once the aspiration catheter was just proximal to the occlusion site the microwire was removed. The rotating hemostatic valve was then removed from the hub, and the proprietary engine was attached directly to the hub of the zoom 35 catheter the pump was turned on confirming backflow and the catheter was advanced until cessation of flow was observed. Catheter was advanced through the site of the occlusion and then withdrawn. Upon withdrawal, aspiration was performed at the hub of the balloon  guide catheter. Once the aspiration catheter was completely removed, aspiration was performed at the hub of the balloon guide confirming free return of blood. Angiogram was performed. There was improved flow through the site of the occlusion, however, again distal migration was observed within the affected division, with again distal migration within the inferior division. We elected 1 final pass into the inferior division M3/M4 with the microcatheter. Fourth pass: A roadmap angiogram was performed. Through the balloon guide, a 160 cm Trevo Trak microcatheter was advanced with the synchro soft,  into the distal aspect of the inferior division, targeting the residual, most proximal occlusive embolus of the inferior division branches. Once the microcatheter was distal to the site of the occlusion, the microwire was removed. Catheter aspiration was performed as the catheter was then withdrawn. Catheter was completely withdrawn from the system. Angiogram performed demonstrated persistent occlusion in the distal M3/M4 cortical branch of the inferior division. At this time we elected to terminate the case. Balloon guide was withdrawn. A JB 1 catheter was used to select the origin of the left CCA. Angiogram was performed. JB 1 catheter was then removed. The skin at the puncture site was then cleaned with Chlorhexidine. The 8 French sheath was removed and an 27F angioseal was deployed. Flat panel CT was performed. Patient was extubated once the CT was reviewed. Patient tolerated the procedure well and remained hemodynamically stable throughout. No complications were encountered and no significant blood loss encountered. IMPRESSION: Status post ultrasound guided access right common femoral artery for mechanical thrombectomy of right-sided MCA ELVO, with 4 passes achieving TICI 2c flow at the completion. Angiogram of the left ICA demonstrates no occlusive branches intracranial. Angio-Seal restored for hemostasis. Signed, Dulcy Fanny. Dellia Nims, RPVI Vascular and Interventional Radiology Specialists [facility] Radiology PLAN: Patient extubated ICU status Target systolic blood pressure of 120-140 Right hip straight time 4 hours Frequent neurovascular checks Repeat neurologic imaging with CT and/MRI at the discretion of neurology team Electronically Signed   By: Corrie Mckusick D.O.   On: 12/18/2020 21:30   ECHOCARDIOGRAM COMPLETE  Result Date: 12/17/2020    ECHOCARDIOGRAM REPORT   Patient Name:   JAKSEN AUDE [place] Date of Exam: 12/17/2020 Medical Rec #:  [id number]      Height:       72.0 in Accession #:    [id number]     Weight:       245.1 lb Date of Birth:  04/19/1960      BSA:          2.323 m Patient Age:    8 years       BP:           122/72 mmHg Patient Gender: M              HR:           72 bpm. Exam Location:  Inpatient Procedure: 2D Echo, Cardiac Doppler and Color Doppler Indications:    Stroke 434.91 / I163.9  History:        Patient has prior history of Echocardiogram examinations, most                 recent 08/03/2019. Previous Myocardial Infarction, Stroke, Mitral                 Valve Disease; Risk Factors:Hypertension, Diabetes and                 Dyslipidemia. Substance abuse ([place]) (From Hx).  Sonographer:    Leavy Cella RDCS Sonographer#2:  Alvino Chapel RCS Referring Phys: [phone] MCNEILL P KIRKPATRICK IMPRESSIONS  1. Definity contrast utilized. There is a nonmobile LV mural thrombus (approximately 1.2 x 0.5 cm) in region of akinesis involving the apical septal wall. Reported to covering team at 5:10 PM.  2. Left ventricular ejection fraction, by estimation, is 60 to 65%. The left ventricle has normal function. The left ventricle demonstrates regional wall motion abnormalities (see scoring diagram/findings for description). There is mild left ventricular  hypertrophy. Left ventricular diastolic parameters  are indeterminate.  3. Right ventricular systolic function is normal. The right ventricular size is normal. Tricuspid  regurgitation signal is inadequate for assessing PA pressure.  4. The mitral valve is grossly normal. Trivial mitral valve regurgitation.  5. The aortic valve is tricuspid. Aortic valve regurgitation is not visualized.  6. The inferior vena cava is normal in size with greater than 50% respiratory variability, suggesting right atrial pressure of 3 mmHg. Comparison(s): Prior images reviewed side by side. LV mural thrombus newly identified. Prior study from May 2021 did have similar apical septal wall motion abnormality. FINDINGS  Left Ventricle: Left ventricular ejection fraction, by estimation, is 60 to 65%. The left ventricle has normal function. The left ventricle demonstrates regional wall motion abnormalities. Definity contrast agent was given IV to delineate the left ventricular endocardial borders. The left ventricular internal cavity size was normal in size. There is mild left ventricular hypertrophy. Left ventricular diastolic parameters are indeterminate.  LV Wall Scoring: The apical septal segment and apex are akinetic. The apical anterior segment is hypokinetic. The anterior wall, entire lateral wall, anterior septum, entire inferior wall, mid inferoseptal segment, and basal inferoseptal segment are normal. Right Ventricle: The right ventricular size is normal. No increase in right ventricular wall thickness. Right ventricular systolic function is normal. Tricuspid regurgitation signal is inadequate for assessing PA pressure. Left Atrium: Left atrial size was normal in size. Right Atrium: Right atrial size was normal in size. Pericardium: There is no evidence of pericardial effusion. Presence of pericardial fat pad. Mitral Valve: The mitral valve is grossly normal. Trivial mitral valve regurgitation. Tricuspid Valve: The tricuspid valve is grossly normal. Tricuspid valve regurgitation is trivial. Aortic Valve: The aortic valve is tricuspid. There is mild aortic valve annular calcification. Aortic valve  regurgitation is not visualized. Pulmonic Valve: The pulmonic valve was not well visualized. Pulmonic valve regurgitation is not visualized. Aorta: The aortic root is normal in size and structure. Venous: The inferior vena cava is normal in size with greater than 50% respiratory variability, suggesting right atrial pressure of 3 mmHg. IAS/Shunts: No atrial level shunt detected by color flow Doppler.  LEFT VENTRICLE PLAX 2D LVIDd:         5.40 cm  Diastology LVIDs:         3.50 cm  LV e' medial:    6.85 cm/s LV PW:         1.10 cm  LV E/e' medial:  8.9 LV IVS:        1.10 cm  LV e' lateral:   7.83 cm/s LVOT diam:     2.00 cm  LV E/e' lateral: 7.8 LVOT Area:     3.14 cm  RIGHT VENTRICLE RV S prime:     15.90 cm/s TAPSE (M-mode): 2.4 cm LEFT ATRIUM             Index       RIGHT ATRIUM          Index LA diam:        3.50 cm 1.51 cm/m  RA Area:     9.85 cm LA Vol (A2C):   48.8 ml 21.01 ml/m RA Volume:   21.30 ml 9.17 ml/m LA Vol (A4C):   18.1 ml 7.79 ml/m LA Biplane Vol: 30.1 ml 12.96 ml/m   AORTA Ao Root diam: 2.80 cm MITRAL VALVE MV Area (PHT): 3.08 cm    SHUNTS MV Decel Time: 246 msec    Systemic Diam: 2.00 cm MV E velocity: 60.80 cm/s  MV A velocity: 78.60 cm/s MV E/A ratio:  0.77 Rozann Lesches MD Electronically signed by Rozann Lesches MD Signature Date/Time: 12/17/2020/5:19:12 PM    Final    CUP PACEART REMOTE DEVICE CHECK  Result Date: 12/14/2020 ILR summary report received. Battery status OK. Normal device function. No new symptom, tachy, brady, or pause episodes. No new AF episodes. Monthly summary reports and ROV/PRN  IR PERCUTANEOUS ART THROMBECTOMY/INFUSION INTRACRANIAL INC DIAG ANGIO  Result Date: 12/19/2020 INDICATION: 60 year old male with a history acute stroke right M1/right MCA syndrome, presents for angiogram and possible mechanical thrombectomy EXAM: ULTRASOUND-GUIDED ACCESS RIGHT COMMON FEMORAL ARTERY CERVICAL AND CEREBRAL ANGIOGRAM MECHANICAL THROMBECTOMY RIGHT MCA FLAT PANEL CT  ANGIO-SEAL FOR HEMOSTASIS COMPARISON:  CT IMAGING SAME DAY MEDICATIONS: None ANESTHESIA/SEDATION: The anesthesia team was present to provide general endotracheal tube anesthesia and for patient monitoring during the procedure. Intubation was performed in room 2 neuro IR. Left radial arterial line was performed by the anesthesia team. Interventional neuro radiology nursing staff was also present. CONTRAST:  90 cc Omni 240 FLUOROSCOPY TIME:  Fluoroscopy Time: 19 minutes 6 seconds (1588 mGy). COMPLICATIONS: SIR LEVEL B - Normal therapy, includes overnight admission for observation. Subarachnoid hemorrhage TECHNIQUE: Informed written consent was obtained from the patient's family after a thorough discussion of the procedural risks, benefits and alternatives. Specific risks discussed include: Bleeding, infection, contrast reaction, kidney injury/failure, need for further procedure/surgery, arterial injury or dissection, embolization to new territory, intracranial hemorrhage (10-15% risk), neurologic deterioration, cardiopulmonary collapse, death. All questions were addressed. Maximal Sterile Barrier Technique was utilized including during the procedure including caps, mask, sterile gowns, sterile gloves, sterile drape, hand hygiene and skin antiseptic. A timeout was performed prior to the initiation of the procedure. The anesthesia team was present to provide general endotracheal tube anesthesia and for patient monitoring during the procedure. Interventional neuro radiology nursing staff was also present. FINDINGS: Initial Findings: Right common carotid artery:  Normal course caliber and contour. Right external carotid artery: Patent with antegrade flow. Right internal carotid artery: Normal course caliber and contour of the cervical portion. Vertical and petrous segment patent with normal course caliber contour. Cavernous segment patent. Clinoid segment patent. Antegrade flow of the ophthalmic artery. Ophthalmic segment  patent. Terminus patent. Right MCA: Occlusion was present at the branch point of superior and inferior. There had been migration of the M1 thrombus distally to the branch point, when compared to the baseline CT angiogram. The embolus had moved primarily into the inferior division, with flow maintained into the superior division on the initial angiogram. Right ACA: A 1 segment patent. Full perfusion of the right ACA territory with crossover flow into the left ACA territory via patent anterior communicating artery. Completion Findings: Right MCA: Restoration of flow through the right MCA, into the inferior and superior division branch point. The fragmented embolus entered the inferior division. The inferior division is the dominant division, with perfusion of the temporal lobe and the parietal lobe, with the superior segment perfusing essentially the frontal region only. After treatment, there is restoration of the proximal flow, with slow flow within distal branches of the inferior division, affecting both parietal and temporal lobes, with TICI 2c flow: Near complete perfusion except for slow flow in a few distal cortical vessels/presence of small distal cortical emboli Left: Left common carotid artery:  Normal course caliber and contour. Left external carotid artery: Patent with antegrade flow. Left internal carotid artery: Normal course caliber and contour of the cervical portion. Mild atherosclerotic plaque at the origin  of the left ICA without significant stenosis. Vertical and petrous segment patent with normal course caliber contour. Cavernous segment patent. Clinoid segment patent. Antegrade flow of the ophthalmic artery. Ophthalmic segment patent. Terminus patent. Left MCA: M1 segment patent. Insular and opercular segments patent. Unremarkable caliber and course of the cortical segments. Typical arterial, capillary/ parenchymal, and venous phase. Left ACA: A 1 segment patent. A 2 segment perfuses the right  ACA territory. Flat panel CT: Small volume subarachnoid hemorrhage within the right sulci, [place] IIIC PROCEDURE: The anesthesia team was present to provide general endotracheal tube anesthesia and for patient monitoring during the procedure. Intubation was performed in negative pressure [place] in neuro IR holding. Interventional neuro radiology nursing staff was also present. Ultrasound survey of the right inguinal region was performed with images stored and sent to PACs. 11 blade scalpel was used to make a small incision. Blunt dissection was performed with US guidance. A micropuncture needle was used access the right common femoral artery under ultrasound. With excellent arterial blood flow returned, an .018 micro wire was passed through the needle, observed to enter the abdominal aorta under fluoroscopy. The needle was removed, and a micropuncture sheath was placed over the wire. The inner dilator and wire were removed, and an 035 wire was advanced under fluoroscopy into the abdominal aorta. The sheath was removed and a 25cm 61F straight vascular sheath was placed. The dilator was removed and the sheath was flushed. Sheath was attached to pressurized and heparinized saline bag for constant forward flow. A coaxial system was then advanced over the 035 wire. This included a 95cm 087 "Walrus" balloon guide with coaxial 125cm Berenstein diagnostic catheter. This was advanced to the proximal descending thoracic aorta. Wire was then removed. Double flush of the catheter was performed. Catheter was then used to select the innominate artery. Angiogram was performed. Using roadmap technique, the catheter was advanced over a standard glide wire into right cervical ICA, with distal position achieved of the balloon guide. The diagnostic catheter and the wire were removed. Formal angiogram was performed. Road map function was used once the occluded vessel was identified. Copious back flush was performed and the balloon  catheter was attached to heparinized and pressurized saline bag for forward flow. A second coaxial system was then advanced through the balloon catheter, which included the selected intermediate catheter, microcatheter, and microwire. In this scenario, the set up included a zoom 71 aspiration catheter, a Trevo Provue18 microcatheter, and 014 synchro soft wire. This system was advanced through the balloon guide catheter under the road-map function, with adequate back-flush at the rotating hemostatic valve at that back end of the balloon guide. Once the wire and microcatheter were into the laceral segment, we attach heparinized saline pressured flush to the guide. Microcatheter and the intermediate catheter system were advanced through the terminal ICA and MCA gently to the proximal M1 segment, and were advanced up to but not through the occluded segment. The zoom catheter was then advanced over the microwire into the proximal M1 segment. The microcatheter microwire were then removed. The proprietary engine was attached to the hub of the aspiration catheter confirming return of flow. Catheter was then gently advanced into the embolus/thrombus at the site of occlusion. Approximately 1 minute of time was observed and then the catheter was withdrawn. Catheter was withdrawn into the balloon guide with free aspiration performed at the hub of the aspiration catheter. Angiogram was performed, which confirmed restoration of flow through the proximal inferior segment, with distal  migration of the thrombus into a distal branch. We elected to attempt a second pass. 2nd pass: Coaxial system was advanced through the balloon guide, which included a zoom 71 aspiration catheter, a Trevo Provue18 microcatheter, and 014 synchro soft wire. The system was advanced through the balloon guide catheter under the road-map function, with adequate back-flush at the rotating hemostatic valve at that back end of the balloon guide. Microcatheter  and the intermediate catheter system were advanced through the terminal ICA and MCA to the level of the bifurcation of superior/inferior division. The micro wire was then carefully advanced into the inferior segment and through the occluded segment. Microcatheter was then manipulated through the occluded segment and the wire was removed with saline drip at the hub. Blood was then aspirated through the hub of the microcatheter, and a gentle contrast injection was performed confirming intraluminal position within the inferior division. A rotating hemostatic valve was then attached to the back end of the microcatheter, and a pressurized and heparinized saline bag was attached to the catheter. 4 x 40 solitaire device was then selected. Back flush was achieved at the rotating hemostatic valve, and then the device was gently advanced through the microcatheter to the distal end. The retriever was then unsheathed by withdrawing the microcatheter under fluoroscopy. Once the retriever was completely unsheathed, the microcatheter was carefully stripped from the delivery device. Control angiogram was performed from the intermediate catheter. A 3 minute time interval was observed. Constant aspiration using the proprietary engine was then performed at the intermediate catheter, as the retriever was gently and slowly withdrawn with fluoroscopic observation. Once the retriever was "corked" within the tip of the intermediate catheter, both were removed from the system. Free aspiration was confirmed at the hub of the balloon guide catheter, with free blood return confirmed. The balloon was then deflated, and a control angiogram was performed. Improved flow, with again distal migration within the inferior division. We elected to attempt a third pass. 3rd pass: The 014 synchro soft wire was loaded through a zoom 35 aspiration catheter. Roadmap angiogram was performed. The aspiration catheter and the microwire were then advanced to the  inferior segment, just proximal to the occlusion site. Once the aspiration catheter was just proximal to the occlusion site the microwire was removed. The rotating hemostatic valve was then removed from the hub, and the proprietary engine was attached directly to the hub of the zoom 35 catheter the pump was turned on confirming backflow and the catheter was advanced until cessation of flow was observed. Catheter was advanced through the site of the occlusion and then withdrawn. Upon withdrawal, aspiration was performed at the hub of the balloon guide catheter. Once the aspiration catheter was completely removed, aspiration was performed at the hub of the balloon guide confirming free return of blood. Angiogram was performed. There was improved flow through the site of the occlusion, however, again distal migration was observed within the affected division, with again distal migration within the inferior division. We elected 1 final pass into the inferior division M3/M4 with the microcatheter. Fourth pass: A roadmap angiogram was performed. Through the balloon guide, a 160 cm Trevo Trak microcatheter was advanced with the synchro soft, into the distal aspect of the inferior division, targeting the residual, most proximal occlusive embolus of the inferior division branches. Once the microcatheter was distal to the site of the occlusion, the microwire was removed. Catheter aspiration was performed as the catheter was then withdrawn. Catheter was completely withdrawn from the  system. Angiogram performed demonstrated persistent occlusion in the distal M3/M4 cortical branch of the inferior division. At this time we elected to terminate the case. Balloon guide was withdrawn. A JB 1 catheter was used to select the origin of the left CCA. Angiogram was performed. JB 1 catheter was then removed. The skin at the puncture site was then cleaned with Chlorhexidine. The 8 French sheath was removed and an 73F angioseal was deployed.  Flat panel CT was performed. Patient was extubated once the CT was reviewed. Patient tolerated the procedure well and remained hemodynamically stable throughout. No complications were encountered and no significant blood loss encountered. IMPRESSION: Status post ultrasound guided access right common femoral artery for mechanical thrombectomy of right-sided MCA ELVO, with 4 passes achieving TICI 2c flow at the completion. Angiogram of the left ICA demonstrates no occlusive branches intracranial. Angio-Seal restored for hemostasis. Signed, Dulcy Fanny. Dellia Nims, RPVI [facility] Radiology PLAN: Patient extubated ICU status Target systolic blood pressure of 120-140 Right hip straight time 4 hours Frequent neurovascular checks Repeat neurologic imaging with CT and/MRI at the discretion of neurology team Electronically Signed   By: Corrie Mckusick D.O.   On: 12/18/2020 21:30   CT HEAD CODE STROKE WO CONTRAST  Result Date: 12/17/2020 CLINICAL DATA:  Code stroke.  Left-sided weakness EXAM: CT HEAD WITHOUT CONTRAST TECHNIQUE: Contiguous axial images were obtained from the base of the skull through the vertex without intravenous contrast. COMPARISON:  08/02/2019 FINDINGS: Brain: Small chronic high right frontal cortex infarct. No convincing acute infarct. No acute hemorrhage. Remote bilateral cerebellar infarcts. No hydrocephalus or mass. Vascular: Hyperdense right M1 segment Skull: Negative Sinuses/Orbits: Negative Other: Critical Value/emergent results were called by telephone at the time of interpretation on 12/17/2020 at 5:24 am to provider [facility] , who verbally acknowledged these results. ASPECTS [facility] Stroke Program Early CT Score) - Ganglionic level infarction (caudate, lentiform nuclei, internal capsule, insula, M1-M3 cortex): 7 - Supraganglionic infarction (M4-M6 cortex): 3 when accounting for chronic cortex infarct Total score (0-10 with 10 being normal): 10  IMPRESSION: 1. Hyperdense right M1 segment, reference CTA. 2. No acute hemorrhage or visible acute infarct. 3. Remote right frontal cortex and bilateral cerebellar infarcts. Electronically Signed   By: Jorje Guild M.D.   On: 12/17/2020 05:25   IR ANGIO INTRA EXTRACRAN SEL COM CAROTID INNOMINATE UNI L MOD SED  Result Date: 12/19/2020 INDICATION: 60 year old male with a history acute stroke right M1/right MCA syndrome, presents for angiogram and possible mechanical thrombectomy EXAM: ULTRASOUND-GUIDED ACCESS RIGHT COMMON FEMORAL ARTERY CERVICAL AND CEREBRAL ANGIOGRAM MECHANICAL THROMBECTOMY RIGHT MCA FLAT PANEL CT ANGIO-SEAL FOR HEMOSTASIS COMPARISON:  CT IMAGING SAME DAY MEDICATIONS: None ANESTHESIA/SEDATION: The anesthesia team was present to provide general endotracheal tube anesthesia and for patient monitoring during the procedure. Intubation was performed in room 2 neuro IR. Left radial arterial line was performed by the anesthesia team. Interventional neuro radiology nursing staff was also present. CONTRAST:  90 cc Omni 240 FLUOROSCOPY TIME:  Fluoroscopy Time: 19 minutes 6 seconds (1588 mGy). COMPLICATIONS: SIR LEVEL B - Normal therapy, includes overnight admission for observation. Subarachnoid hemorrhage TECHNIQUE: Informed written consent was obtained from the patient's family after a thorough discussion of the procedural risks, benefits and alternatives. Specific risks discussed include: Bleeding, infection, contrast reaction, kidney injury/failure, need for further procedure/surgery, arterial injury or dissection, embolization to new territory, intracranial hemorrhage (10-15% risk), neurologic deterioration, cardiopulmonary collapse, death. All questions were addressed. Maximal Sterile Barrier Technique was utilized  including during the procedure including caps, mask, sterile gowns, sterile gloves, sterile drape, hand hygiene and skin antiseptic. A timeout was performed prior to the initiation of  the procedure. The anesthesia team was present to provide general endotracheal tube anesthesia and for patient monitoring during the procedure. Interventional neuro radiology nursing staff was also present. FINDINGS: Initial Findings: Right common carotid artery:  Normal course caliber and contour. Right external carotid artery: Patent with antegrade flow. Right internal carotid artery: Normal course caliber and contour of the cervical portion. Vertical and petrous segment patent with normal course caliber contour. Cavernous segment patent. Clinoid segment patent. Antegrade flow of the ophthalmic artery. Ophthalmic segment patent. Terminus patent. Right MCA: Occlusion was present at the branch point of superior and inferior. There had been migration of the M1 thrombus distally to the branch point, when compared to the baseline CT angiogram. The embolus had moved primarily into the inferior division, with flow maintained into the superior division on the initial angiogram. Right ACA: A 1 segment patent. Full perfusion of the right ACA territory with crossover flow into the left ACA territory via patent anterior communicating artery. Completion Findings: Right MCA: Restoration of flow through the right MCA, into the inferior and superior division branch point. The fragmented embolus entered the inferior division. The inferior division is the dominant division, with perfusion of the temporal lobe and the parietal lobe, with the superior segment perfusing essentially the frontal region only. After treatment, there is restoration of the proximal flow, with slow flow within distal branches of the inferior division, affecting both parietal and temporal lobes, with TICI 2c flow: Near complete perfusion except for slow flow in a few distal cortical vessels/presence of small distal cortical emboli Left: Left common carotid artery:  Normal course caliber and contour. Left external carotid artery: Patent with antegrade flow.  Left internal carotid artery: Normal course caliber and contour of the cervical portion. Mild atherosclerotic plaque at the origin of the left ICA without significant stenosis. Vertical and petrous segment patent with normal course caliber contour. Cavernous segment patent. Clinoid segment patent. Antegrade flow of the ophthalmic artery. Ophthalmic segment patent. Terminus patent. Left MCA: M1 segment patent. Insular and opercular segments patent. Unremarkable caliber and course of the cortical segments. Typical arterial, capillary/ parenchymal, and venous phase. Left ACA: A 1 segment patent. A 2 segment perfuses the right ACA territory. Flat panel CT: Small volume subarachnoid hemorrhage within the right sulci, [place] IIIC PROCEDURE: The anesthesia team was present to provide general endotracheal tube anesthesia and for patient monitoring during the procedure. Intubation was performed in negative pressure [place] in neuro IR holding. Interventional neuro radiology nursing staff was also present. Ultrasound survey of the right inguinal region was performed with images stored and sent to PACs. 11 blade scalpel was used to make a small incision. Blunt dissection was performed with US guidance. A micropuncture needle was used access the right common femoral artery under ultrasound. With excellent arterial blood flow returned, an .018 micro wire was passed through the needle, observed to enter the abdominal aorta under fluoroscopy. The needle was removed, and a micropuncture sheath was placed over the wire. The inner dilator and wire were removed, and an 035 wire was advanced under fluoroscopy into the abdominal aorta. The sheath was removed and a 25cm 51F straight vascular sheath was placed. The dilator was removed and the sheath was flushed. Sheath was attached to pressurized and heparinized saline bag for constant forward flow. A coaxial system was then  advanced over the 035 wire. This included a 95cm 087 "Walrus"  balloon guide with coaxial 125cm Berenstein diagnostic catheter. This was advanced to the proximal descending thoracic aorta. Wire was then removed. Double flush of the catheter was performed. Catheter was then used to select the innominate artery. Angiogram was performed. Using roadmap technique, the catheter was advanced over a standard glide wire into right cervical ICA, with distal position achieved of the balloon guide. The diagnostic catheter and the wire were removed. Formal angiogram was performed. Road map function was used once the occluded vessel was identified. Copious back flush was performed and the balloon catheter was attached to heparinized and pressurized saline bag for forward flow. A second coaxial system was then advanced through the balloon catheter, which included the selected intermediate catheter, microcatheter, and microwire. In this scenario, the set up included a zoom 71 aspiration catheter, a Trevo Provue18 microcatheter, and 014 synchro soft wire. This system was advanced through the balloon guide catheter under the road-map function, with adequate back-flush at the rotating hemostatic valve at that back end of the balloon guide. Once the wire and microcatheter were into the laceral segment, we attach heparinized saline pressured flush to the guide. Microcatheter and the intermediate catheter system were advanced through the terminal ICA and MCA gently to the proximal M1 segment, and were advanced up to but not through the occluded segment. The zoom catheter was then advanced over the microwire into the proximal M1 segment. The microcatheter microwire were then removed. The proprietary engine was attached to the hub of the aspiration catheter confirming return of flow. Catheter was then gently advanced into the embolus/thrombus at the site of occlusion. Approximately 1 minute of time was observed and then the catheter was withdrawn. Catheter was withdrawn into the balloon guide with  free aspiration performed at the hub of the aspiration catheter. Angiogram was performed, which confirmed restoration of flow through the proximal inferior segment, with distal migration of the thrombus into a distal branch. We elected to attempt a second pass. 2nd pass: Coaxial system was advanced through the balloon guide, which included a zoom 71 aspiration catheter, a Trevo Provue18 microcatheter, and 014 synchro soft wire. The system was advanced through the balloon guide catheter under the road-map function, with adequate back-flush at the rotating hemostatic valve at that back end of the balloon guide. Microcatheter and the intermediate catheter system were advanced through the terminal ICA and MCA to the level of the bifurcation of superior/inferior division. The micro wire was then carefully advanced into the inferior segment and through the occluded segment. Microcatheter was then manipulated through the occluded segment and the wire was removed with saline drip at the hub. Blood was then aspirated through the hub of the microcatheter, and a gentle contrast injection was performed confirming intraluminal position within the inferior division. A rotating hemostatic valve was then attached to the back end of the microcatheter, and a pressurized and heparinized saline bag was attached to the catheter. 4 x 40 solitaire device was then selected. Back flush was achieved at the rotating hemostatic valve, and then the device was gently advanced through the microcatheter to the distal end. The retriever was then unsheathed by withdrawing the microcatheter under fluoroscopy. Once the retriever was completely unsheathed, the microcatheter was carefully stripped from the delivery device. Control angiogram was performed from the intermediate catheter. A 3 minute time interval was observed. Constant aspiration using the proprietary engine was then performed at the intermediate catheter, as the  retriever was gently and  slowly withdrawn with fluoroscopic observation. Once the retriever was "corked" within the tip of the intermediate catheter, both were removed from the system. Free aspiration was confirmed at the hub of the balloon guide catheter, with free blood return confirmed. The balloon was then deflated, and a control angiogram was performed. Improved flow, with again distal migration within the inferior division. We elected to attempt a third pass. 3rd pass: The 014 synchro soft wire was loaded through a zoom 35 aspiration catheter. Roadmap angiogram was performed. The aspiration catheter and the microwire were then advanced to the inferior segment, just proximal to the occlusion site. Once the aspiration catheter was just proximal to the occlusion site the microwire was removed. The rotating hemostatic valve was then removed from the hub, and the proprietary engine was attached directly to the hub of the zoom 35 catheter the pump was turned on confirming backflow and the catheter was advanced until cessation of flow was observed. Catheter was advanced through the site of the occlusion and then withdrawn. Upon withdrawal, aspiration was performed at the hub of the balloon guide catheter. Once the aspiration catheter was completely removed, aspiration was performed at the hub of the balloon guide confirming free return of blood. Angiogram was performed. There was improved flow through the site of the occlusion, however, again distal migration was observed within the affected division, with again distal migration within the inferior division. We elected 1 final pass into the inferior division M3/M4 with the microcatheter. Fourth pass: A roadmap angiogram was performed. Through the balloon guide, a 160 cm Trevo Trak microcatheter was advanced with the synchro soft, into the distal aspect of the inferior division, targeting the residual, most proximal occlusive embolus of the inferior division branches. Once the microcatheter  was distal to the site of the occlusion, the microwire was removed. Catheter aspiration was performed as the catheter was then withdrawn. Catheter was completely withdrawn from the system. Angiogram performed demonstrated persistent occlusion in the distal M3/M4 cortical branch of the inferior division. At this time we elected to terminate the case. Balloon guide was withdrawn. A JB 1 catheter was used to select the origin of the left CCA. Angiogram was performed. JB 1 catheter was then removed. The skin at the puncture site was then cleaned with Chlorhexidine. The 8 French sheath was removed and an 63F angioseal was deployed. Flat panel CT was performed. Patient was extubated once the CT was reviewed. Patient tolerated the procedure well and remained hemodynamically stable throughout. No complications were encountered and no significant blood loss encountered. IMPRESSION: Status post ultrasound guided access right common femoral artery for mechanical thrombectomy of right-sided MCA ELVO, with 4 passes achieving TICI 2c flow at the completion. Angiogram of the left ICA demonstrates no occlusive branches intracranial. Angio-Seal restored for hemostasis. Signed, Dulcy Fanny. Dellia Nims, RPVI Vascular [facility] Interventional Radiology Specialists [facility] Radiology PLAN: Patient extubated ICU status Target systolic blood pressure of 120-140 Right hip straight time 4 hours Frequent neurovascular checks Repeat neurologic imaging with CT and/MRI at the discretion of neurology team Electronically Signed   By: Corrie Mckusick D.O.   On: 12/18/2020 21:30     PHYSICAL EXAM  Temp:  [97.6 F (36.4 C)-98.8 F (37.1 C)] 98.8 F (37.1 C) (09/19 0800) Pulse Rate:  [65-71] 65 (09/19 0800) Resp:  [13-22] 21 (09/19 0900) BP: (111-147)/(66-90) 147/78 (09/19 0800) SpO2:  [97 %-99 %] 99 % (09/19 0800)  General - Well nourished, well developed, in  no apparent distress.  Ophthalmologic - fundi not visualized due to  noncooperation.  Cardiovascular - Regular rhythm and rate.  Neuro - awake, alert, eyes open, orientated to age and people. Expressive aphasia improving and able to say short sentences but still has word salad and paraphasic errors, following all simple commands. No gaze palsy, tracking bilaterally, visual field full, PERRL. Mild right facial droop. Tongue midline. Bilateral UEs 5/5, no drift. Bilaterally LEs 5/5, no drift. Sensation symmetrical bilaterally, b/l FTN intact, gait not tested.    ASSESSMENT/PLAN Mr. Myer F [place] is a 60 y.o. left-handed male with history of stroke, diabetes, hypertension, hyperlipidemia, CAD/MI, CKD admitted for left-sided weakness and aphasia. No tPA given due to outside window.    Stroke:  right MCA infarct due to right M1 occlusion status post IR with TICI2c revascularization, embolic pattern, secondary to LV thrombus Resultant expressive aphasia CT head no acute abnormality, old right frontal and bilateral cerebellar infarcts.  Hyperdense right MCA sign. CTA head neck right M1 occlusion S/p IR with TICI2c revascularization of right M1 MRI right MCA moderately large infarct with petechial hemorrhage MRA right M1 patent 2D Echo nonmobile LV mural thrombus (approximately 1.2 x 0.5 cm) in region of akinesis involving the apical septal wall.  LDL 49 HgbA1c 6.7 Heparin IV for VTE prophylaxis aspirin 81 mg daily and clopidogrel 75 mg daily prior to admission, now on heparin IV due to LV thrombus Patient counseled to be compliant with his antithrombotic medications Ongoing aggressive stroke risk factor management Therapy recommendations: Pending Disposition: Pending  LV thrombus TTE showed nonmobile LV mural thrombus (approximately 1.2 x 0.5 cm) in region of akinesis involving the apical septal wall.  Discussed with cardiology on-call Dr. Juliane Lack, recommend cardiac MRI to confirm LV thrombus Cardiac MRI confirmed LV apical thrombus Continue heparin IV Loop  recorder so far no A. fib 08/2019 TEE showed EF 50 to 55%, no regional wall motion abnormalities, no PFO Cardiology consult requested Hx of CAD s/p stents  History of stroke 02/2015, left arm weakness, difficulty walking, MRI showed right motor strip small infarct.  EF 60 to 65%, MRA head and neck right VA occlusion.  A1c 7.2, LDL 57, discharged with DAPT 08/2019 admitted for right superior cerebellum infarct with hemorrhagic transformation.  CT head and neck right VA chronic occlusion.  EF 65 to 70%.  TEE showed EF 50 to 55%, no regional wall motion abnormalities, no PFO.  Loop recorder placed.  LDL 75, A1c 8.7, discharged with DAPT and Lipitor. Followed with Dr. Leonie Man and Janett Billow NP at [facility], loop recorder negative for A. fib.  Diabetes HgbA1c 6.7, goal < 7.0 controlled CBG monitoring SSI On heart healthy and carb diet DM education and close PCP follow up  Hypertension Stable Long term BP goal normotensive  Hyperlipidemia Home meds: Lipitor 80 and Zetia 10 LDL 49, goal < 70 Now on Lipitor 80 and Zetia 10 Continue statin at discharge  Other Stroke Risk Factors Obesity, BMI 33.25 Coronary artery disease/MI s/p stenting  Other Active Problems CKD IIIA, creatinine 1.34->1.34->1.28  Hospital day # 2   Rosalin Hawking, MD PhD Stroke Neurology 12/19/2020 11:39 AM    To contact Stroke Continuity provider, please refer to [email or web address] After hours, contact General Neurology

## 2020-12-19 NOTE — Progress Notes (Signed)
[place] for heparin Indication:  LV Thrombus  Allergies  Allergen Reactions   Other Other (See Comments)    Rybelsus ab pain at 3 mg, 7 mg n/v   Pravachol [Pravastatin Sodium] Other (See Comments)    Muscle aches    Patient Measurements:   Heparin Dosing Weight: 101 kg   Vital Signs: Temp: 98.8 F (37.1 C) (09/19 0800) Temp Source: Oral (09/19 0800) BP: 147/78 (09/19 0800) Pulse Rate: 65 (09/19 0800)  Labs: Recent Labs    12/17/20 0506 12/17/20 0506 12/18/20 0322 12/18/20 1144 12/18/20 2052 12/19/20 0623  HGB 14.6  --  14.7  --   --  13.4  HCT 42.6  --  44.1  --   --  39.4  PLT 185  --  175  --   --  145*  APTT 26  --   --   --   --   --   LABPROT 12.6  --   --   --   --   --   INR 0.9  --   --   --   --   --   HEPARINUNFRC  --    < > 0.47 0.65 0.61 0.62  CREATININE 1.34*  --  1.34*  --   --  1.28*   < > = values in this interval not displayed.     Estimated Creatinine Clearance: 78 mL/min (A) (by C-G formula based on SCr of 1.28 mg/dL (H)).   Assessment: 9 YOM who presented as a code stroke found to have a R M1 occlusion s/p thrombectomy. MRI showed moderately large R MCA infarct with petechial hemorrhage. 2D ECHO showed an LV thrombus and pharmacy consulted to start IV heparin for treatment.  Give moderately large acute stroke and petechial hemorrhage, will start heparin very conservatively WITHOUT boluses aiming for a conservative anti-Xa range.   Heparin level remains above reduced goal of 0.3-0.5 s/p rate decrease to 1150 units/hr, drawn appropriately   Goal of Therapy:  Heparin level 0.3-0.5 units/ml Monitor platelets by anticoagulation protocol: Yes   Plan:  Decrease heparin gtt to 1000 units/hr F/u 6 hour heparin level  Bertis Ruddy, PharmD Clinical Pharmacist ED Pharmacist Phone # [phone] 12/19/2020 8:58 AM

## 2020-12-19 NOTE — Progress Notes (Signed)
Inpatient Diabetes Program Recommendations  AACE/ADA: New Consensus Statement on Inpatient Glycemic Control (2015)  Target Ranges:  Prepandial:   less than 140 mg/dL      Peak postprandial:   less than 180 mg/dL (1-2 hours)      Critically ill patients:  140 - 180 mg/dL   Lab Results  Component Value Date   GLUCAP 149 (H) 12/19/2020   HGBA1C 6.7 (H) 12/18/2020    Review of Glycemic Control Results for Karl Morgan, Karl Morgan (MRN [id number]) as of 12/19/2020 10:45  Ref. Range 12/18/2020 11:47 12/18/2020 16:27 12/18/2020 19:59 12/19/2020 08:05  Glucose-Capillary Latest Ref Range: 70 - 99 mg/dL 176 (H) 213 (H) 220 (H) 149 (H)   Diabetes history: Type 2 DM Outpatient Diabetes medications: Amaryl 2 mg BID, Metformin 850 mg BID, Januvia 100 mg QD, Farxiga 10 mg QD Current orders for Inpatient glycemic control: Farxiga 10 mg QD, Novolog 0-15 units TID  Inpatient Diabetes Program Recommendations:    Consider changing diet to carb modified and adding Novolog 0-5 units QHS.   Thanks, Bronson Curb, MSN, RNC-OB Diabetes Coordinator [phone] (8a-5p)

## 2020-12-19 NOTE — Progress Notes (Signed)
Patient received in bed, VSS, A&Ox4 no acute distress noticed, Will continue to monitor.

## 2020-12-19 NOTE — Progress Notes (Signed)
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 Pt with apparent communication and cognitive/perceptual deficits as noted below. Pt impulsive and demonstrates increased frustration regarding inability to complete tasks. At this time recommend follow up with OT at neuro outpt to facilitate safe return to work and maximize independence with IADL tasks. Will follow up acutely.    12/19/20 1400  OT Visit Information  Last OT Received On 12/19/20  Assistance Needed +1  History of Present Illness 60 yo with + R MCA CVA  due to right M1 occlusion status post IR with revascularization, embolic pattern, secondary to LV thrombus. PMH DM, HTN  Precautions  Precautions None  Home Living  Family/patient expects to be discharged to: Private residence  Living Arrangements Spouse/significant other  Available Help at Discharge Available 24 hours/day  Type of [place] to enter  Entrance Stairs-Number of Steps 2  [place] One level  Bathroom Shower/Tub Walk-in shower;Tub/shower Restaurant manager, fast food Yes  How Accessible Accessible via walker  Home Equipment None  Prior Function  Level of Independence Independent  Comments Manages facilities at [facility]  Communication  Communication Expressive difficulties  Pain Assessment  Pain Assessment No/denies pain  Faces Pain Scale 0  Cognition  Arousal/Alertness Awake/alert  Behavior During Therapy Impulsive;Agitated (decreased impulse control; poor self monitoring)  Overall Cognitive Status Difficult to assess  General Comments Decreased awareness of deficits other then speech deficits; able to place numbers on Clock face but unable to set hands correctly. Pt given time to set as 11:!0 howeer pt set hands to 3:15. Again asked for 11:10, pt appeared frustrated then after multiple verbal cues,  ablet o set hands correctly. When asked to look from one colored pen to the other, pt continued to maintain gaze on one pen; When asked to look at the other pen, pt became frustrated and stated I wasn't explaining what to do. Unsure if pt has difficulty with color identification; Able to write name but unable to recall correct address - wrote incorrect address; will further assess  Difficult to assess due to Impaired communication  Upper Extremity Assessment  Upper Extremity Assessment Overall WFL for tasks assessed (using L hand functionally; able to write adn demonstrates good in-hand manipulation skills)  Lower Extremity Assessment  Lower Extremity Assessment Defer to PT evaluation  Cervical / Trunk Assessment  Cervical / Trunk Assessment Normal  ADL  General ADL Comments At baseline for basic ADL tasks however will require assistance for IADL tasks  Vision- History  Baseline Vision/History 1 Wears glasses  Vision- Assessment  Vision Assessment? Yes  Additional Comments appears intact; will further assess in functional context  Perception  Perception Tested? Yes  Comments Will further assess; difficulty with clock draw; letter substitution noted; ? color identification  Bed Mobility  Overal bed mobility Independent  Transfers  Overall transfer level Modified independent  Balance  Overall balance assessment No apparent balance deficits (not formally assessed)  General Comments  General comments (skin integrity, edema, etc.) finger probe laying on floor becuase pt did not want it on; impulsively unplugging tele cords; moving quickly with IV pole, requiring cues to slow pace to avoid pulling IV out  OT - End of Session  Equipment Utilized During Treatment Gait belt  Activity Tolerance Patient tolerated treatment well  Patient left in chair;with call bell/phone within reach;with family/visitor present  Nurse Communication  Mobility status;Other (comment) (DC recommendations)  OT  Assessment  OT Recommendation/Assessment Patient needs continued OT Services  OT Visit Diagnosis Other symptoms and signs involving the nervous system (R29.898);Other symptoms and signs involving cognitive function;Cognitive communication deficit (R41.841)  Symptoms and signs involving cognitive functions Cerebral infarction  OT Problem List Impaired vision/perception;Decreased cognition;Decreased safety awareness;Decreased knowledge of use of DME or AE;Cardiopulmonary status limiting activity;Obesity  OT Plan  OT Frequency (ACUTE ONLY) Min 2X/week  OT Treatment/Interventions (ACUTE ONLY) Self-care/ADL training;Therapeutic exercise;Neuromuscular education;DME and/or AE instruction;Therapeutic activities;Cognitive remediation/compensation;Visual/perceptual remediation/compensation;Patient/family education  AM-PAC OT "6 Clicks" Daily Activity Outcome Measure (Version 2)  Help from another person eating meals? 4  Help from another person taking care of personal grooming? 4  Help from another person toileting, which includes using toliet, bedpan, or urinal? 4  Help from another person bathing (including washing, rinsing, drying)? 4  Help from another person to put on and taking off regular upper body clothing? 4  Help from another person to put on and taking off regular lower body clothing? 4  6 Click Score 24  Progressive Mobility  What is the highest level of mobility based on the progressive mobility assessment? Level 6 (Walks independently in room and hall) - Balance while walking in room without assist - Complete  OT Recommendation  Follow Up Recommendations Outpatient OT  Individuals Consulted  Consulted and Agree with Results and Recommendations Patient;Family member/caregiver  Family Member Consulted wife  Acute Rehab OT Goals  Patient Stated Goal to have better speech  OT Goal Formulation With patient/family  Time For Goal Achievement 01/02/21  Potential to Achieve Goals Good  OT  Time Calculation  OT Start Time (ACUTE ONLY) 1126  OT Stop Time (ACUTE ONLY) 1150  OT Time Calculation (min) 24 min  OT General Charges  $OT Visit 1 Visit  OT Evaluation  $OT Eval Moderate Complexity 1 Mod  Written Expression  Dominant Hand Left  Maurie Boettcher, OT/L   Acute OT Clinical Specialist Acute Rehabilitation Services Pager [phone] Office [phone]

## 2020-12-19 NOTE — Progress Notes (Signed)
Referring Physician(s): Dr Lavera Guise   Supervising Physician: Corrie Mckusick  Patient Status:  [facility] - In-pt  Chief Complaint:  CVA Procedure: 12/17/20- Dr Earleen Newport US guided right CFA access Cervical & Cerebral Angiogram Mechanical Thrombectomy x 4   Subjective: 4th Pass Device:                   Trevo trak 21             Result                         TICI 2c Findings:                                M1 occlusion after temporal branch  Pt is up in bed Without complaint Follows all commands Moving all 4s with good strength Speech-- improving daily Still stumbles with some words-- but slows down and does fine    Allergies: Other and Pravachol [pravastatin sodium]  Medications: Prior to Admission medications   Medication Sig Start Date End Date Taking? Authorizing Provider  alprostadil (EDEX) 20 MCG injection 20 mcg by Intracavitary route once as needed for erectile dysfunction. Use no more than 3 times per week   Yes [provider]  aspirin EC 81 MG tablet Take 1 tablet (81 mg total) by mouth daily. STOP TAKING after 21 days and continue PLAVIX ALONE Patient taking differently: Take 81 mg by mouth daily. 08/04/19  Yes Donzetta Starch, NP  atorvastatin (LIPITOR) 80 MG tablet TAKE 1 TABLET BY MOUTH DAILY AT 6 PM. Patient taking differently: Take 80 mg by mouth daily. 10/31/20 10/31/21 Yes McLean-Scocuzza, Nino Glow, MD  clomiPHENE (CLOMID) 50 MG tablet TAKE 1 TABLET BY MOUTH 3 DAYS A WEEK Patient taking differently: Take 50 mg by mouth 3 (three) times a week. 12/01/19 01/30/21 Yes Dahlstedt, Annie Main, MD  clopidogrel (PLAVIX) 75 MG tablet TAKE 1 TABLET BY MOUTH ONCE DAILY Patient taking differently: Take 75 mg by mouth daily. 08/25/20  Yes   dapagliflozin propanediol (FARXIGA) 10 MG TABS tablet TAKE 1 TABLET (10 MG TOTAL) BY MOUTH EVERY MORNING Patient taking differently: Take 10 mg by mouth daily. 11/08/20 11/08/21 Yes McLean-Scocuzza, Nino Glow, MD  ezetimibe (ZETIA) 10 MG tablet  TAKE 1 TABLET (10 MG TOTAL) BY MOUTH DAILY. Patient taking differently: Take 10 mg by mouth daily. 08/08/20 08/08/21 Yes McLean-Scocuzza, Nino Glow, MD  glimepiride (AMARYL) 2 MG tablet TAKE 1 TABLET (2 MG TOTAL) BY MOUTH 2 (TWO) TIMES DAILY WITH A MEAL. Patient taking differently: Take 2 mg by mouth in the morning and at bedtime. 05/10/20 05/10/21 Yes McLean-Scocuzza, Nino Glow, MD  levothyroxine (SYNTHROID) 75 MCG tablet Take 1 tablet (75 mcg total) by mouth daily. 11/16/20  Yes Dutch Quint B, FNP  lisinopril (ZESTRIL) 40 MG tablet TAKE 1 TABLET (40 MG TOTAL) BY MOUTH DAILY. Patient taking differently: Take 40 mg by mouth daily. 05/10/20 05/10/21 Yes McLean-Scocuzza, Nino Glow, MD  metFORMIN (GLUCOPHAGE) 850 MG tablet TAKE 1 TABLET BY MOUTH TWICE DAILY Patient taking differently: Take 850 mg by mouth 2 (two) times daily. 11/08/20 11/08/21 Yes McLean-Scocuzza, Nino Glow, MD  metoprolol succinate (TOPROL-XL) 50 MG 24 hr tablet TAKE 1 TABLET BY MOUTH DAILY. TAKE WITH OR IMMEDIATELY FOLLOWING A MEAL. Patient taking differently: Take 50 mg by mouth daily. 11/08/20 11/08/21 Yes McLean-Scocuzza, Nino Glow, MD  omeprazole (PRILOSEC OTC) 20  MG tablet Take 20 mg by mouth daily as needed (acid reflux/heartburn).   Yes [provider]  sildenafil (VIAGRA) 100 MG tablet Take 0.5-1 tablets (50-100 mg total) by mouth daily as needed for erectile dysfunction. Patient taking differently: Take 100 mg by mouth daily as needed for erectile dysfunction. 11/10/12  Yes Wendie Agreste, MD  sitaGLIPtin (JANUVIA) 100 MG tablet TAKE 1 TABLET (100 MG TOTAL) BY MOUTH DAILY. Patient taking differently: Take 100 mg by mouth daily. 05/10/20 05/10/21 Yes McLean-Scocuzza, Nino Glow, MD  valACYclovir (VALTREX) 1000 MG tablet take 1 to 2 tablets by mouth every 12 hours for 2 to 3 days as needed for fever blisters Patient taking differently: Take 500-1,000 mg by mouth 2 (two) times daily as needed (fever blisters). 11/08/20  Yes McLean-Scocuzza, Nino Glow, MD   blood glucose meter kit and supplies Dispense based on patient and insurance preference. Use up to bid daily as directed. (FOR ICD-10 E10.9, E11.9). 1 year supply of lancets and strips 08/06/19   McLean-Scocuzza, Nino Glow, MD  Continuous Blood Gluc Receiver (FREESTYLE LIBRE 14 DAY READER) DEVI 1 Device by Does not apply route in the morning and at bedtime. Patient not taking: No sig reported 08/06/19   McLean-Scocuzza, Nino Glow, MD  FREESTYLE LITE test strip 1 each 3 (three) times daily. 08/10/19   [provider]  Lancets (FREESTYLE) lancets 1 each 3 (three) times daily. 08/10/19   [provider]     Vital Signs: BP (!) 147/78 (BP Location: Right Arm)   Pulse 65   Temp 98.8 F (37.1 C) (Oral)   Resp 13   SpO2 98%   Physical Exam Vitals reviewed.  Constitutional:      Comments: Smile = Shows teeth Does have a minimal Rt eye droop-- "Has always been there" - per pt  Eyes:     Extraocular Movements: Extraocular movements intact.  Cardiovascular:     Rate and Rhythm: Normal rate.  Pulmonary:     Breath sounds: Normal breath sounds.  Musculoskeletal:     Comments: Moves all 4 Follows all commands  Rt groin is clean and dry NT no bleeding No hematoma  2+ pulses    Imaging: CT Angio Head W or Wo Contrast  Result Date: 12/17/2020 CLINICAL DATA:  Left-sided weakness EXAM: CT ANGIOGRAPHY HEAD AND NECK TECHNIQUE: Multidetector CT imaging of the head and neck was performed using the standard protocol during bolus administration of intravenous contrast. Multiplanar CT image reconstructions and MIPs were obtained to evaluate the vascular anatomy. Carotid stenosis measurements (when applicable) are obtained utilizing NASCET criteria, using the distal internal carotid diameter as the denominator. CONTRAST:  23m OMNIPAQUE IOHEXOL 350 MG/ML SOLN COMPARISON:  07/31/2019 FINDINGS: CTA NECK FINDINGS Aortic arch: Standard branching. Imaged portion shows no evidence of aneurysm or  dissection. No significant stenosis of the major arch vessel origins. Right carotid system: Mixed density plaque at the bifurcation without flow limiting stenosis or ulceration. Left carotid system: Mixed density plaque at the bifurcation without flow limiting stenosis or ulceration. Vertebral arteries: No proximal subclavian stenosis. Strong left vertebral artery dominance. Skeleton: Degenerative endplate and facet spurring. Other neck: Negative Upper chest: Coronary calcification. Review of the MIP images confirms the above findings CTA HEAD FINDINGS Anterior circulation: Atheromatous calcification affecting the carotid siphons. Abrupt cut off at the distal right M1 segment with some downstream branch reconstitution. Negative for aneurysm Posterior circulation: Basilar is fed solely by the left vertebral artery. Fetal type bilateral PCA flow. No  branch occlusion, beading, or aneurysm. Venous sinuses: Unremarkable for the arterial phase Anatomic variants: As above Review of the MIP images confirms the above findings Critical Value/emergent results were called by telephone at the time of interpretation on 12/17/2020 at 5:24 am to provider [facility] , who verbally acknowledged these results. IMPRESSION: 1. Emergent large vessel occlusion at the right M1 segment. 2. Nonobstructive atherosclerosis of the cervical and intracranial carotids. Electronically Signed   By: Jorje Guild M.D.   On: 12/17/2020 05:30   CT Angio Neck W and/or Wo Contrast  Result Date: 12/17/2020 CLINICAL DATA:  Left-sided weakness EXAM: CT ANGIOGRAPHY HEAD AND NECK TECHNIQUE: Multidetector CT imaging of the head and neck was performed using the standard protocol during bolus administration of intravenous contrast. Multiplanar CT image reconstructions and MIPs were obtained to evaluate the vascular anatomy. Carotid stenosis measurements (when applicable) are obtained utilizing NASCET criteria, using the distal internal carotid diameter as  the denominator. CONTRAST:  29m OMNIPAQUE IOHEXOL 350 MG/ML SOLN COMPARISON:  07/31/2019 FINDINGS: CTA NECK FINDINGS Aortic arch: Standard branching. Imaged portion shows no evidence of aneurysm or dissection. No significant stenosis of the major arch vessel origins. Right carotid system: Mixed density plaque at the bifurcation without flow limiting stenosis or ulceration. Left carotid system: Mixed density plaque at the bifurcation without flow limiting stenosis or ulceration. Vertebral arteries: No proximal subclavian stenosis. Strong left vertebral artery dominance. Skeleton: Degenerative endplate and facet spurring. Other neck: Negative Upper chest: Coronary calcification. Review of the MIP images confirms the above findings CTA HEAD FINDINGS Anterior circulation: Atheromatous calcification affecting the carotid siphons. Abrupt cut off at the distal right M1 segment with some downstream branch reconstitution. Negative for aneurysm Posterior circulation: Basilar is fed solely by the left vertebral artery. Fetal type bilateral PCA flow. No branch occlusion, beading, or aneurysm. Venous sinuses: Unremarkable for the arterial phase Anatomic variants: As above Review of the MIP images confirms the above findings Critical Value/emergent results were called by telephone at the time of interpretation on 12/17/2020 at 5:24 am [facility] these results. IMPRESSION: 1. Emergent large vessel occlusion at the right M1 segment. 2. Nonobstructive atherosclerosis of the cervical and intracranial carotids. Electronically Signed   By: JJorje GuildM.D.   On: 12/17/2020 05:30   MR ANGIO HEAD WO CONTRAST  Result Date: 12/17/2020 CLINICAL DATA:  Stroke follow-up. Status post thrombectomy for treatment of M1 occlusion. EXAM: MRI HEAD WITHOUT CONTRAST MRA HEAD WITHOUT CONTRAST TECHNIQUE: Multiplanar, multi-echo pulse sequences of the brain and surrounding structures were acquired without  intravenous contrast. Angiographic images of the Circle of Willis were acquired using MRA technique without intravenous contrast. COMPARISON:  Head CT and CTA 12/17/2020.  Head MRI 08/01/2019. FINDINGS: MRI HEAD FINDINGS Brain: There is a small to moderate-sized acute right MCA infarct involving portions of the temporal, frontal, and parietal lobes and insula with associated petechial hemorrhage. A small chronic right frontal cortical infarct is again noted, and there are chronic bilateral cerebellar infarcts with associated chronic blood products. The ventricles are normal in size. No mass, midline shift, or extra-axial fluid collection is identified. Vascular: More fully evaluated below. Skull and upper cervical spine: Unremarkable bone marrow signal. Sinuses/Orbits: Unremarkable orbits. Mild mucosal thickening in the paranasal sinuses. Small right and trace left mastoid effusions. Other: None. MRA HEAD FINDINGS Anterior circulation: The internal carotid arteries are widely patent from skull base to carotid termini. ACAs and MCAs are patent without evidence of a proximal  branch occlusion or significant proximal stenosis. Specifically, the recanalized right M1 segment remains patent without an underlying stenosis. No aneurysm is identified. Posterior circulation: The included intracranial portion of the left vertebral artery is widely patent and supplies the basilar. The right vertebral artery is not visualized and was shown to be hypoplastic on the earlier CTA. The basilar artery is widely patent. There is a large right posterior communicating artery. 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 No significant proximal PCA stenosis is evident. No aneurysm is identified. Anatomic variants: As above. IMPRESSION: 1. Small to moderate-sized acute right MCA infarct. 2. Chronic right  frontal and bilateral cerebellar infarcts. 3. Negative head MRA. Electronically Signed   By: Logan Bores M.D.   On: 12/17/2020 14:55   MR BRAIN WO CONTRAST  Result Date: 12/17/2020 CLINICAL DATA:  Stroke follow-up. Status post thrombectomy for treatment of M1 occlusion. EXAM: MRI HEAD WITHOUT CONTRAST MRA HEAD WITHOUT CONTRAST TECHNIQUE: Multiplanar, multi-echo pulse sequences of the brain and surrounding structures were acquired without intravenous contrast. Angiographic images of the Circle of Willis were acquired using MRA technique without intravenous contrast. COMPARISON:  Head CT and CTA 12/17/2020.  Head MRI 08/01/2019. FINDINGS: MRI HEAD FINDINGS Brain: There is a small to moderate-sized acute right MCA infarct involving portions of the temporal, frontal, and parietal lobes and insula with associated petechial hemorrhage. A small chronic right frontal cortical infarct is again noted, and there are chronic bilateral cerebellar infarcts with associated chronic blood products. The ventricles are normal in size. No mass, midline shift, or extra-axial fluid collection is identified. Vascular: More fully evaluated below. Skull and upper cervical spine: Unremarkable bone marrow signal. Sinuses/Orbits: Unremarkable orbits. Mild mucosal thickening in the paranasal sinuses. Small right and trace left mastoid effusions. Other: None. MRA HEAD FINDINGS Anterior circulation: The internal carotid arteries are widely patent from skull base to carotid termini. ACAs and MCAs are patent without evidence of a proximal branch occlusion or significant proximal stenosis. Specifically, the recanalized right M1 segment remains patent without an underlying stenosis. No aneurysm is identified. Posterior circulation: The included intracranial portion of the left vertebral artery is widely patent and supplies the basilar. The right vertebral artery is not visualized and was shown to be hypoplastic on the earlier CTA. The basilar  artery is widely patent. There is a large right posterior communicating artery. 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 No significant proximal PCA stenosis is evident. No aneurysm is identified. Anatomic variants: As above. IMPRESSION: 1. Small to moderate-sized acute right MCA infarct. 2. Chronic right frontal and bilateral cerebellar infarcts. 3. Negative head MRA. Electronically Signed   By: Logan Bores M.D.   On: 12/17/2020 14:55   IR CT Head Ltd  Result Date: 12/19/2020 INDICATION: 60 year old male with a history acute stroke right M1/right MCA syndrome, presents for angiogram and possible mechanical thrombectomy EXAM: ULTRASOUND-GUIDED ACCESS RIGHT COMMON FEMORAL ARTERY CERVICAL AND CEREBRAL ANGIOGRAM MECHANICAL THROMBECTOMY RIGHT MCA FLAT PANEL CT ANGIO-SEAL FOR HEMOSTASIS COMPARISON:  CT IMAGING SAME DAY MEDICATIONS: None ANESTHESIA/SEDATION: The anesthesia team was present to provide general endotracheal tube anesthesia and for patient monitoring during the procedure. Intubation was performed in room 2 neuro IR. Left radial arterial line was performed by the anesthesia team. Interventional neuro  radiology nursing staff was also present. CONTRAST:  90 cc Omni 240 FLUOROSCOPY TIME:  Fluoroscopy Time: 19 minutes 6 seconds (1588 mGy). COMPLICATIONS: SIR LEVEL B - Normal therapy, includes overnight admission for observation. Subarachnoid hemorrhage TECHNIQUE: Informed written consent was obtained from the patient's family after a thorough discussion of the procedural risks, benefits and alternatives. Specific risks discussed include: Bleeding, infection, contrast reaction, kidney injury/failure, need for further procedure/surgery, arterial injury or dissection, embolization to new territory, intracranial hemorrhage (10-15% risk), neurologic deterioration,  cardiopulmonary collapse, death. All questions were addressed. Maximal Sterile Barrier Technique was utilized including during the procedure including caps, mask, sterile gowns, sterile gloves, sterile drape, hand hygiene and skin antiseptic. A timeout was performed prior to the initiation of the procedure. The anesthesia team was present to provide general endotracheal tube anesthesia and for patient monitoring during the procedure. Interventional neuro radiology nursing staff was also present. FINDINGS: Initial Findings: Right common carotid artery:  Normal course caliber and contour. Right external carotid artery: Patent with antegrade flow. Right internal carotid artery: Normal course caliber and contour of the cervical portion. Vertical and petrous segment patent with normal course caliber contour. Cavernous segment patent. Clinoid segment patent. Antegrade flow of the ophthalmic artery. Ophthalmic segment patent. Terminus patent. Right MCA: Occlusion was present at the branch point of superior and inferior. There had been migration of the M1 thrombus distally to the branch point, when compared to the baseline CT angiogram. The embolus had moved primarily into the inferior division, with flow maintained into the superior division on the initial angiogram. Right ACA: A 1 segment patent. Full perfusion of the right ACA territory with crossover flow into the left ACA territory via patent anterior communicating artery. Completion Findings: Right MCA: Restoration of flow through the right MCA, into the inferior and superior division branch point. The fragmented embolus entered the inferior division. The inferior division is the dominant division, with perfusion of the temporal lobe and the parietal lobe, with the superior segment perfusing essentially the frontal region only. After treatment, there is restoration of the proximal flow, with slow flow within distal branches of the inferior division, affecting both  parietal and temporal lobes, with TICI 2c flow: Near complete perfusion except for slow flow in a few distal cortical vessels/presence of small distal cortical emboli Left: Left common carotid artery:  Normal course caliber and contour. Left external carotid artery: Patent with antegrade flow. Left internal carotid artery: Normal course caliber and contour of the cervical portion. Mild atherosclerotic plaque at the origin of the left ICA without significant stenosis. Vertical and petrous segment patent with normal course caliber contour. Cavernous segment patent. Clinoid segment patent. Antegrade flow of the ophthalmic artery. Ophthalmic segment patent. Terminus patent. Left MCA: M1 segment patent. Insular and opercular segments patent. Unremarkable caliber and course of the cortical segments. Typical arterial, capillary/ parenchymal, and venous phase. Left ACA: A 1 segment patent. A 2 segment perfuses the right ACA territory. Flat panel CT: Small volume subarachnoid hemorrhage within the right sulci, [place] IIIC PROCEDURE: The anesthesia team was present to provide general endotracheal tube anesthesia and for patient monitoring during the procedure. Intubation was performed in negative pressure [place] in neuro IR holding. Interventional neuro radiology nursing staff was also present. Ultrasound survey of the right inguinal region was performed with images stored and sent to PACs. 11 blade scalpel was used to make a small incision. Blunt dissection was performed with US guidance. A micropuncture needle was used access the right common  femoral artery under ultrasound. With excellent arterial blood flow returned, an .018 micro wire was passed through the needle, observed to enter the abdominal aorta under fluoroscopy. The needle was removed, and a micropuncture sheath was placed over the wire. The inner dilator and wire were removed, and an 035 wire was advanced under fluoroscopy into the abdominal aorta. The sheath  was removed and a 25cm 57F straight vascular sheath was placed. The dilator was removed and the sheath was flushed. Sheath was attached to pressurized and heparinized saline bag for constant forward flow. A coaxial system was then advanced over the 035 wire. This included a 95cm 087 "Walrus" balloon guide with coaxial 125cm Berenstein diagnostic catheter. This was advanced to the proximal descending thoracic aorta. Wire was then removed. Double flush of the catheter was performed. Catheter was then used to select the innominate artery. Angiogram was performed. Using roadmap technique, the catheter was advanced over a standard glide wire into right cervical ICA, with distal position achieved of the balloon guide. The diagnostic catheter and the wire were removed. Formal angiogram was performed. Road map function was used once the occluded vessel was identified. Copious back flush was performed and the balloon catheter was attached to heparinized and pressurized saline bag for forward flow. A second coaxial system was then advanced through the balloon catheter, which included the selected intermediate catheter, microcatheter, and microwire. In this scenario, the set up included a zoom 71 aspiration catheter, a Trevo Provue18 microcatheter, and 014 synchro soft wire. This system was advanced through the balloon guide catheter under the road-map function, with adequate back-flush at the rotating hemostatic valve at that back end of the balloon guide. Once the wire and microcatheter were into the laceral segment, we attach heparinized saline pressured flush to the guide. Microcatheter and the intermediate catheter system were advanced through the terminal ICA and MCA gently to the proximal M1 segment, and were advanced up to but not through the occluded segment. The zoom catheter was then advanced over the microwire into the proximal M1 segment. The microcatheter microwire were then removed. The proprietary engine was  attached to the hub of the aspiration catheter confirming return of flow. Catheter was then gently advanced into the embolus/thrombus at the site of occlusion. Approximately 1 minute of time was observed and then the catheter was withdrawn. Catheter was withdrawn into the balloon guide with free aspiration performed at the hub of the aspiration catheter. Angiogram was performed, which confirmed restoration of flow through the proximal inferior segment, with distal migration of the thrombus into a distal branch. We elected to attempt a second pass. 2nd pass: Coaxial system was advanced through the balloon guide, which included a zoom 71 aspiration catheter, a Trevo Provue18 microcatheter, and 014 synchro soft wire. The system was advanced through the balloon guide catheter under the road-map function, with adequate back-flush at the rotating hemostatic valve at that back end of the balloon guide. Microcatheter and the intermediate catheter system were advanced through the terminal ICA and MCA to the level of the bifurcation of superior/inferior division. The micro wire was then carefully advanced into the inferior segment and through the occluded segment. Microcatheter was then manipulated through the occluded segment and the wire was removed with saline drip at the hub. Blood was then aspirated through the hub of the microcatheter, and a gentle contrast injection was performed confirming intraluminal position within the inferior division. A rotating hemostatic valve was then attached to the back end of the microcatheter,  and a pressurized and heparinized saline bag was attached to the catheter. 4 x 40 solitaire device was then selected. Back flush was achieved at the rotating hemostatic valve, and then the device was gently advanced through the microcatheter to the distal end. The retriever was then unsheathed by withdrawing the microcatheter under fluoroscopy. Once the retriever was completely unsheathed, the  microcatheter was carefully stripped from the delivery device. Control angiogram was performed from the intermediate catheter. A 3 minute time interval was observed. Constant aspiration using the proprietary engine was then performed at the intermediate catheter, as the retriever was gently and slowly withdrawn with fluoroscopic observation. Once the retriever was "corked" within the tip of the intermediate catheter, both were removed from the system. Free aspiration was confirmed at the hub of the balloon guide catheter, with free blood return confirmed. The balloon was then deflated, and a control angiogram was performed. Improved flow, with again distal migration within the inferior division. We elected to attempt a third pass. 3rd pass: The 014 synchro soft wire was loaded through a zoom 35 aspiration catheter. Roadmap angiogram was performed. The aspiration catheter and the microwire were then advanced to the inferior segment, just proximal to the occlusion site. Once the aspiration catheter was just proximal to the occlusion site the microwire was removed. The rotating hemostatic valve was then removed from the hub, and the proprietary engine was attached directly to the hub of the zoom 35 catheter the pump was turned on confirming backflow and the catheter was advanced until cessation of flow was observed. Catheter was advanced through the site of the occlusion and then withdrawn. Upon withdrawal, aspiration was performed at the hub of the balloon guide catheter. Once the aspiration catheter was completely removed, aspiration was performed at the hub of the balloon guide confirming free return of blood. Angiogram was performed. There was improved flow through the site of the occlusion, however, again distal migration was observed within the affected division, with again distal migration within the inferior division. We elected 1 final pass into the inferior division M3/M4 with the microcatheter. Fourth pass:  A roadmap angiogram was performed. Through the balloon guide, a 160 cm Trevo Trak microcatheter was advanced with the synchro soft, into the distal aspect of the inferior division, targeting the residual, most proximal occlusive embolus of the inferior division branches. Once the microcatheter was distal to the site of the occlusion, the microwire was removed. Catheter aspiration was performed as the catheter was then withdrawn. Catheter was completely withdrawn from the system. Angiogram performed demonstrated persistent occlusion in the distal M3/M4 cortical branch of the inferior division. At this time we elected to terminate the case. Balloon guide was withdrawn. A JB 1 catheter was used to select the origin of the left CCA. Angiogram was performed. JB 1 catheter was then removed. The skin at the puncture site was then cleaned with Chlorhexidine. The 8 French sheath was removed and an 54F angioseal was deployed. Flat panel CT was performed. Patient was extubated once the CT was reviewed. Patient tolerated the procedure well and remained hemodynamically stable throughout. No complications were encountered and no significant blood loss encountered. IMPRESSION: Status post ultrasound guided access right common femoral artery for mechanical thrombectomy of right-sided MCA ELVO, with 4 passes achieving TICI 2c flow at the completion. Angiogram of the left ICA demonstrates no occlusive branches intracranial. Angio-Seal restored for hemostasis. Signed, Dulcy Fanny. Dellia Nims, [place] [facility] Radiology PLAN: Patient extubated ICU  status Target systolic blood pressure of 120-140 Right hip straight time 4 hours Frequent neurovascular checks Repeat neurologic imaging with CT and/MRI at the discretion of neurology team Electronically Signed   By: Corrie Mckusick D.O.   On: 12/18/2020 21:30   IR US Guide Vasc Access Right  Result Date: 12/19/2020 INDICATION: 60 year old male  with a history acute stroke right M1/right MCA syndrome, presents for angiogram and possible mechanical thrombectomy EXAM: ULTRASOUND-GUIDED ACCESS RIGHT COMMON FEMORAL ARTERY CERVICAL AND CEREBRAL ANGIOGRAM MECHANICAL THROMBECTOMY RIGHT MCA FLAT PANEL CT ANGIO-SEAL FOR HEMOSTASIS COMPARISON:  CT IMAGING SAME DAY MEDICATIONS: None ANESTHESIA/SEDATION: The anesthesia team was present to provide general endotracheal tube anesthesia and for patient monitoring during the procedure. Intubation was performed in room 2 neuro IR. Left radial arterial line was performed by the anesthesia team. Interventional neuro radiology nursing staff was also present. CONTRAST:  90 cc Omni 240 FLUOROSCOPY TIME:  Fluoroscopy Time: 19 minutes 6 seconds (1588 mGy). COMPLICATIONS: SIR LEVEL B - Normal therapy, includes overnight admission for observation. Subarachnoid hemorrhage TECHNIQUE: Informed written consent was obtained from the patient's family after a thorough discussion of the procedural risks, benefits and alternatives. Specific risks discussed include: Bleeding, infection, contrast reaction, kidney injury/failure, need for further procedure/surgery, arterial injury or dissection, embolization to new territory, intracranial hemorrhage (10-15% risk), neurologic deterioration, cardiopulmonary collapse, death. All questions were addressed. Maximal Sterile Barrier Technique was utilized including during the procedure including caps, mask, sterile gowns, sterile gloves, sterile drape, hand hygiene and skin antiseptic. A timeout was performed prior to the initiation of the procedure. The anesthesia team was present to provide general endotracheal tube anesthesia and for patient monitoring during the procedure. Interventional neuro radiology nursing staff was also present. FINDINGS: Initial Findings: Right common carotid artery:  Normal course caliber and contour. Right external carotid artery: Patent with antegrade flow. Right internal  carotid artery: Normal course caliber and contour of the cervical portion. Vertical and petrous segment patent with normal course caliber contour. Cavernous segment patent. Clinoid segment patent. Antegrade flow of the ophthalmic artery. Ophthalmic segment patent. Terminus patent. Right MCA: Occlusion was present at the branch point of superior and inferior. There had been migration of the M1 thrombus distally to the branch point, when compared to the baseline CT angiogram. The embolus had moved primarily into the inferior division, with flow maintained into the superior division on the initial angiogram. Right ACA: A 1 segment patent. Full perfusion of the right ACA territory with crossover flow into the left ACA territory via patent anterior communicating artery. Completion Findings: Right MCA: Restoration of flow through the right MCA, into the inferior and superior division branch point. The fragmented embolus entered the inferior division. The inferior division is the dominant division, with perfusion of the temporal lobe and the parietal lobe, with the superior segment perfusing essentially the frontal region only. After treatment, there is restoration of the proximal flow, with slow flow within distal branches of the inferior division, affecting both parietal and temporal lobes, with TICI 2c flow: Near complete perfusion except for slow flow in a few distal cortical vessels/presence of small distal cortical emboli Left: Left common carotid artery:  Normal course caliber and contour. Left external carotid artery: Patent with antegrade flow. Left internal carotid artery: Normal course caliber and contour of the cervical portion. Mild atherosclerotic plaque at the origin of the left ICA without significant stenosis. Vertical and petrous segment patent with normal course caliber contour. Cavernous segment patent. Clinoid segment patent. Antegrade flow  of the ophthalmic artery. Ophthalmic segment patent. Terminus  patent. Left MCA: M1 segment patent. Insular and opercular segments patent. Unremarkable caliber and course of the cortical segments. Typical arterial, capillary/ parenchymal, and venous phase. Left ACA: A 1 segment patent. A 2 segment perfuses the right ACA territory. Flat panel CT: Small volume subarachnoid hemorrhage within the right sulci, [place] IIIC PROCEDURE: The anesthesia team was present to provide general endotracheal tube anesthesia and for patient monitoring during the procedure. Intubation was performed in negative pressure [place] in neuro IR holding. Interventional neuro radiology nursing staff was also present. Ultrasound survey of the right inguinal region was performed with images stored and sent to PACs. 11 blade scalpel was used to make a small incision. Blunt dissection was performed with US guidance. A micropuncture needle was used access the right common femoral artery under ultrasound. With excellent arterial blood flow returned, an .018 micro wire was passed through the needle, observed to enter the abdominal aorta under fluoroscopy. The needle was removed, and a micropuncture sheath was placed over the wire. The inner dilator and wire were removed, and an 035 wire was advanced under fluoroscopy into the abdominal aorta. The sheath was removed and a 25cm 30F straight vascular sheath was placed. The dilator was removed and the sheath was flushed. Sheath was attached to pressurized and heparinized saline bag for constant forward flow. A coaxial system was then advanced over the 035 wire. This included a 95cm 087 "[place]rus" balloon guide with coaxial 125cm Berenstein diagnostic catheter. This was advanced to the proximal descending thoracic aorta. Wire was then removed. Double flush of the catheter was performed. Catheter was then used to select the innominate artery. Angiogram was performed. Using roadmap technique, the catheter was advanced over a standard glide wire into right cervical ICA,  with distal position achieved of the balloon guide. The diagnostic catheter and the wire were removed. Formal angiogram was performed. Road map function was used once the occluded vessel was identified. Copious back flush was performed and the balloon catheter was attached to heparinized and pressurized saline bag for forward flow. A second coaxial system was then advanced through the balloon catheter, which included the selected intermediate catheter, microcatheter, and microwire. In this scenario, the set up included a zoom 71 aspiration catheter, a Trevo Provue18 microcatheter, and 014 synchro soft wire. This system was advanced through the balloon guide catheter under the road-map function, with adequate back-flush at the rotating hemostatic valve at that back end of the balloon guide. Once the wire and microcatheter were into the laceral segment, we attach heparinized saline pressured flush to the guide. Microcatheter and the intermediate catheter system were advanced through the terminal ICA and MCA gently to the proximal M1 segment, and were advanced up to but not through the occluded segment. The zoom catheter was then advanced over the microwire into the proximal M1 segment. The microcatheter microwire were then removed. The proprietary engine was attached to the hub of the aspiration catheter confirming return of flow. Catheter was then gently advanced into the embolus/thrombus at the site of occlusion. Approximately 1 minute of time was observed and then the catheter was withdrawn. Catheter was withdrawn into the balloon guide with free aspiration performed at the hub of the aspiration catheter. Angiogram was performed, which confirmed restoration of flow through the proximal inferior segment, with distal migration of the thrombus into a distal branch. We elected to attempt a second pass. 2nd pass: Coaxial system was advanced through the balloon guide,  which included a zoom 71 aspiration catheter, a Trevo  Provue18 microcatheter, and 014 synchro soft wire. The system was advanced through the balloon guide catheter under the road-map function, with adequate back-flush at the rotating hemostatic valve at that back end of the balloon guide. Microcatheter and the intermediate catheter system were advanced through the terminal ICA and MCA to the level of the bifurcation of superior/inferior division. The micro wire was then carefully advanced into the inferior segment and through the occluded segment. Microcatheter was then manipulated through the occluded segment and the wire was removed with saline drip at the hub. Blood was then aspirated through the hub of the microcatheter, and a gentle contrast injection was performed confirming intraluminal position within the inferior division. A rotating hemostatic valve was then attached to the back end of the microcatheter, and a pressurized and heparinized saline bag was attached to the catheter. 4 x 40 solitaire device was then selected. Back flush was achieved at the rotating hemostatic valve, and then the device was gently advanced through the microcatheter to the distal end. The retriever was then unsheathed by withdrawing the microcatheter under fluoroscopy. Once the retriever was completely unsheathed, the microcatheter was carefully stripped from the delivery device. Control angiogram was performed from the intermediate catheter. A 3 minute time interval was observed. Constant aspiration using the proprietary engine was then performed at the intermediate catheter, as the retriever was gently and slowly withdrawn with fluoroscopic observation. Once the retriever was "corked" within the tip of the intermediate catheter, both were removed from the system. Free aspiration was confirmed at the hub of the balloon guide catheter, with free blood return confirmed. The balloon was then deflated, and a control angiogram was performed. Improved flow, with again distal migration  within the inferior division. We elected to attempt a third pass. 3rd pass: The 014 synchro soft wire was loaded through a zoom 35 aspiration catheter. Roadmap angiogram was performed. The aspiration catheter and the microwire were then advanced to the inferior segment, just proximal to the occlusion site. Once the aspiration catheter was just proximal to the occlusion site the microwire was removed. The rotating hemostatic valve was then removed from the hub, and the proprietary engine was attached directly to the hub of the zoom 35 catheter the pump was turned on confirming backflow and the catheter was advanced until cessation of flow was observed. Catheter was advanced through the site of the occlusion and then withdrawn. Upon withdrawal, aspiration was performed at the hub of the balloon guide catheter. Once the aspiration catheter was completely removed, aspiration was performed at the hub of the balloon guide confirming free return of blood. Angiogram was performed. There was improved flow through the site of the occlusion, however, again distal migration was observed within the affected division, with again distal migration within the inferior division. We elected 1 final pass into the inferior division M3/M4 with the microcatheter. Fourth pass: A roadmap angiogram was performed. Through the balloon guide, a 160 cm Trevo Trak microcatheter was advanced with the synchro soft, into the distal aspect of the inferior division, targeting the residual, most proximal occlusive embolus of the inferior division branches. Once the microcatheter was distal to the site of the occlusion, the microwire was removed. Catheter aspiration was performed as the catheter was then withdrawn. Catheter was completely withdrawn from the system. Angiogram performed demonstrated persistent occlusion in the distal M3/M4 cortical branch of the inferior division. At this time we elected to terminate the case.  Balloon guide was withdrawn.  A JB 1 catheter was used to select the origin of the left CCA. Angiogram was performed. JB 1 catheter was then removed. The skin at the puncture site was then cleaned with Chlorhexidine. The 8 French sheath was removed and an 59F angioseal was deployed. Flat panel CT was performed. Patient was extubated once the CT was reviewed. Patient tolerated the procedure well and remained hemodynamically stable throughout. No complications were encountered and no significant blood loss encountered. IMPRESSION: Status post ultrasound guided access right common femoral artery for mechanical thrombectomy of right-sided MCA ELVO, with 4 passes achieving TICI 2c flow at the completion. Angiogram of the left ICA demonstrates no occlusive branches intracranial. Angio-Seal restored for hemostasis. Signed, Dulcy Fanny. Dellia Nims, [facility] Radiology PLAN: Patient extubated ICU status Target systolic blood pressure of 120-140 Right hip straight time 4 hours Frequent neurovascular checks Repeat neurologic imaging with CT and/MRI at the discretion of neurology team Electronically Signed   By: Corrie Mckusick D.O.   On: 12/18/2020 21:30   ECHOCARDIOGRAM COMPLETE  Result Date: 12/17/2020    ECHOCARDIOGRAM REPORT   Patient Name:   EDU ON [place] Date of Exam: 12/17/2020 Medical Rec #:  [id number]      Height:       72.0 in Accession #:    [id number]     Weight:       245.1 lb Date of Birth:  [birth year]      BSA:          2.323 m Patient Age:    2 years       BP:           122/72 mmHg Patient Gender: M              HR:           72 bpm. Exam Location:  Inpatient Procedure: 2D Echo, Cardiac Doppler and Color Doppler Indications:    Stroke 434.91 / I163.9  History:        Patient has prior history of Echocardiogram examinations, most                 recent 08/03/2019. Previous Myocardial Infarction, Stroke, Mitral                 Valve Disease; Risk Factors:Hypertension, Diabetes and                  Dyslipidemia. Substance abuse ([place]) (From Hx).  Sonographer:    Leavy Cella RDCS Sonographer#2:  Alvino Chapel RCS Referring Phys: [phone] MCNEILL P KIRKPATRICK IMPRESSIONS  1. Definity contrast utilized. There is a nonmobile LV mural thrombus (approximately 1.2 x 0.5 cm) in region of akinesis involving the apical septal wall. Reported to covering team at 5:10 PM.  2. Left ventricular ejection fraction, by estimation, is 60 to 65%. The left ventricle has normal function. The left ventricle demonstrates regional wall motion abnormalities (see scoring diagram/findings for description). There is mild left ventricular  hypertrophy. Left ventricular diastolic parameters are indeterminate.  3. Right ventricular systolic function is normal. The right ventricular size is normal. Tricuspid regurgitation signal is inadequate for assessing PA pressure.  4. The mitral valve is grossly normal. Trivial mitral valve regurgitation.  5. The aortic valve is tricuspid. Aortic valve regurgitation is not visualized.  6. The inferior vena cava is normal in size with greater than 50% respiratory variability, suggesting right atrial pressure of 3 mmHg. Comparison(s): Prior  images reviewed side by side. LV mural thrombus newly identified. Prior study from May 2021 did have similar apical septal wall motion abnormality. FINDINGS  Left Ventricle: Left ventricular ejection fraction, by estimation, is 60 to 65%. The left ventricle has normal function. The left ventricle demonstrates regional wall motion abnormalities. Definity contrast agent was given IV to delineate the left ventricular endocardial borders. The left ventricular internal cavity size was normal in size. There is mild left ventricular hypertrophy. Left ventricular diastolic parameters are indeterminate.  LV Wall Scoring: The apical septal segment and apex are akinetic. The apical anterior segment is hypokinetic. The anterior wall, entire lateral wall, anterior septum,  entire inferior wall, mid inferoseptal segment, and basal inferoseptal segment are normal. Right Ventricle: The right ventricular size is normal. No increase in right ventricular wall thickness. Right ventricular systolic function is normal. Tricuspid regurgitation signal is inadequate for assessing PA pressure. Left Atrium: Left atrial size was normal in size. Right Atrium: Right atrial size was normal in size. Pericardium: There is no evidence of pericardial effusion. Presence of pericardial fat pad. Mitral Valve: The mitral valve is grossly normal. Trivial mitral valve regurgitation. Tricuspid Valve: The tricuspid valve is grossly normal. Tricuspid valve regurgitation is trivial. Aortic Valve: The aortic valve is tricuspid. There is mild aortic valve annular calcification. Aortic valve regurgitation is not visualized. Pulmonic Valve: The pulmonic valve was not well visualized. Pulmonic valve regurgitation is not visualized. Aorta: The aortic root is normal in size and structure. Venous: The inferior vena cava is normal in size with greater than 50% respiratory variability, suggesting right atrial pressure of 3 mmHg. IAS/Shunts: No atrial level shunt detected by color flow Doppler.  LEFT VENTRICLE PLAX 2D LVIDd:         5.40 cm  Diastology LVIDs:         3.50 cm  LV e' medial:    6.85 cm/s LV PW:         1.10 cm  LV E/e' medial:  8.9 LV IVS:        1.10 cm  LV e' lateral:   7.83 cm/s LVOT diam:     2.00 cm  LV E/e' lateral: 7.8 LVOT Area:     3.14 cm  RIGHT VENTRICLE RV S prime:     15.90 cm/s TAPSE (M-mode): 2.4 cm LEFT ATRIUM             Index       RIGHT ATRIUM          Index LA diam:        3.50 cm 1.51 cm/m  RA Area:     9.85 cm LA Vol (A2C):   48.8 ml 21.01 ml/m RA Volume:   21.30 ml 9.17 ml/m LA Vol (A4C):   18.1 ml 7.79 ml/m LA Biplane Vol: 30.1 ml 12.96 ml/m   AORTA Ao Root diam: 2.80 cm MITRAL VALVE MV Area (PHT): 3.08 cm    SHUNTS MV Decel Time: 246 msec    Systemic Diam: 2.00 cm MV E velocity:  60.80 cm/s MV A velocity: 78.60 cm/s MV E/A ratio:  0.77 Rozann Lesches MD Electronically signed by Rozann Lesches MD Signature Date/Time: 12/17/2020/5:19:12 PM    Final    IR PERCUTANEOUS ART THROMBECTOMY/INFUSION INTRACRANIAL INC DIAG ANGIO  Result Date: 12/19/2020 INDICATION: 60 year old male with a history acute stroke right M1/right MCA syndrome, presents for angiogram and possible mechanical thrombectomy EXAM: ULTRASOUND-GUIDED ACCESS RIGHT COMMON FEMORAL ARTERY CERVICAL AND CEREBRAL ANGIOGRAM MECHANICAL THROMBECTOMY RIGHT  MCA FLAT PANEL CT ANGIO-SEAL FOR HEMOSTASIS COMPARISON:  CT IMAGING SAME DAY MEDICATIONS: None ANESTHESIA/SEDATION: The anesthesia team was present to provide general endotracheal tube anesthesia and for patient monitoring during the procedure. Intubation was performed in room 2 neuro IR. Left radial arterial line was performed by the anesthesia team. Interventional neuro radiology nursing staff was also present. CONTRAST:  90 cc Omni 240 FLUOROSCOPY TIME:  Fluoroscopy Time: 19 minutes 6 seconds (1588 mGy). COMPLICATIONS: SIR LEVEL B - Normal therapy, includes overnight admission for observation. Subarachnoid hemorrhage TECHNIQUE: Informed written consent was obtained from the patient's family after a thorough discussion of the procedural risks, benefits and alternatives. Specific risks discussed include: Bleeding, infection, contrast reaction, kidney injury/failure, need for further procedure/surgery, arterial injury or dissection, embolization to new territory, intracranial hemorrhage (10-15% risk), neurologic deterioration, cardiopulmonary collapse, death. All questions were addressed. Maximal Sterile Barrier Technique was utilized including during the procedure including caps, mask, sterile gowns, sterile gloves, sterile drape, hand hygiene and skin antiseptic. A timeout was performed prior to the initiation of the procedure. The anesthesia team was present to provide general  endotracheal tube anesthesia and for patient monitoring during the procedure. Interventional neuro radiology nursing staff was also present. FINDINGS: Initial Findings: Right common carotid artery:  Normal course caliber and contour. Right external carotid artery: Patent with antegrade flow. Right internal carotid artery: Normal course caliber and contour of the cervical portion. Vertical and petrous segment patent with normal course caliber contour. Cavernous segment patent. Clinoid segment patent. Antegrade flow of the ophthalmic artery. Ophthalmic segment patent. Terminus patent. Right MCA: Occlusion was present at the branch point of superior and inferior. There had been migration of the M1 thrombus distally to the branch point, when compared to the baseline CT angiogram. The embolus had moved primarily into the inferior division, with flow maintained into the superior division on the initial angiogram. Right ACA: A 1 segment patent. Full perfusion of the right ACA territory with crossover flow into the left ACA territory via patent anterior communicating artery. Completion Findings: Right MCA: Restoration of flow through the right MCA, into the inferior and superior division branch point. The fragmented embolus entered the inferior division. The inferior division is the dominant division, with perfusion of the temporal lobe and the parietal lobe, with the superior segment perfusing essentially the frontal region only. After treatment, there is restoration of the proximal flow, with slow flow within distal branches of the inferior division, affecting both parietal and temporal lobes, with TICI 2c flow: Near complete perfusion except for slow flow in a few distal cortical vessels/presence of small distal cortical emboli Left: Left common carotid artery:  Normal course caliber and contour. Left external carotid artery: Patent with antegrade flow. Left internal carotid artery: Normal course caliber and contour of  the cervical portion. Mild atherosclerotic plaque at the origin of the left ICA without significant stenosis. Vertical and petrous segment patent with normal course caliber contour. Cavernous segment patent. Clinoid segment patent. Antegrade flow of the ophthalmic artery. Ophthalmic segment patent. Terminus patent. Left MCA: M1 segment patent. Insular and opercular segments patent. Unremarkable caliber and course of the cortical segments. Typical arterial, capillary/ parenchymal, and venous phase. Left ACA: A 1 segment patent. A 2 segment perfuses the right ACA territory. Flat panel CT: Small volume subarachnoid hemorrhage within the right sulci, [place] IIIC PROCEDURE: The anesthesia team was present to provide general endotracheal tube anesthesia and for patient monitoring during the procedure. Intubation was performed in negative pressure [place]  in neuro IR holding. Interventional neuro radiology nursing staff was also present. Ultrasound survey of the right inguinal region was performed with images stored and sent to PACs. 11 blade scalpel was used to make a small incision. Blunt dissection was performed with US guidance. A micropuncture needle was used access the right common femoral artery under ultrasound. With excellent arterial blood flow returned, an .018 micro wire was passed through the needle, observed to enter the abdominal aorta under fluoroscopy. The needle was removed, and a micropuncture sheath was placed over the wire. The inner dilator and wire were removed, and an 035 wire was advanced under fluoroscopy into the abdominal aorta. The sheath was removed and a 25cm 42F straight vascular sheath was placed. The dilator was removed and the sheath was flushed. Sheath was attached to pressurized and heparinized saline bag for constant forward flow. A coaxial system was then advanced over the 035 wire. This included a 95cm 087 "Walrus" balloon guide with coaxial 125cm Berenstein diagnostic catheter. This  was advanced to the proximal descending thoracic aorta. Wire was then removed. Double flush of the catheter was performed. Catheter was then used to select the innominate artery. Angiogram was performed. Using roadmap technique, the catheter was advanced over a standard glide wire into right cervical ICA, with distal position achieved of the balloon guide. The diagnostic catheter and the wire were removed. Formal angiogram was performed. Road map function was used once the occluded vessel was identified. Copious back flush was performed and the balloon catheter was attached to heparinized and pressurized saline bag for forward flow. A second coaxial system was then advanced through the balloon catheter, which included the selected intermediate catheter, microcatheter, and microwire. In this scenario, the set up included a zoom 71 aspiration catheter, a Trevo Provue18 microcatheter, and 014 synchro soft wire. This system was advanced through the balloon guide catheter under the road-map function, with adequate back-flush at the rotating hemostatic valve at that back end of the balloon guide. Once the wire and microcatheter were into the laceral segment, we attach heparinized saline pressured flush to the guide. Microcatheter and the intermediate catheter system were advanced through the terminal ICA and MCA gently to the proximal M1 segment, and were advanced up to but not through the occluded segment. The zoom catheter was then advanced over the microwire into the proximal M1 segment. The microcatheter microwire were then removed. The proprietary engine was attached to the hub of the aspiration catheter confirming return of flow. Catheter was then gently advanced into the embolus/thrombus at the site of occlusion. Approximately 1 minute of time was observed and then the catheter was withdrawn. Catheter was withdrawn into the balloon guide with free aspiration performed at the hub of the aspiration catheter.  Angiogram was performed, which confirmed restoration of flow through the proximal inferior segment, with distal migration of the thrombus into a distal branch. We elected to attempt a second pass. 2nd pass: Coaxial system was advanced through the balloon guide, which included a zoom 71 aspiration catheter, a Trevo Provue18 microcatheter, and 014 synchro soft wire. The system was advanced through the balloon guide catheter under the road-map function, with adequate back-flush at the rotating hemostatic valve at that back end of the balloon guide. Microcatheter and the intermediate catheter system were advanced through the terminal ICA and MCA to the level of the bifurcation of superior/inferior division. The micro wire was then carefully advanced into the inferior segment and through the occluded segment. Microcatheter was then  manipulated through the occluded segment and the wire was removed with saline drip at the hub. Blood was then aspirated through the hub of the microcatheter, and a gentle contrast injection was performed confirming intraluminal position within the inferior division. A rotating hemostatic valve was then attached to the back end of the microcatheter, and a pressurized and heparinized saline bag was attached to the catheter. 4 x 40 solitaire device was then selected. Back flush was achieved at the rotating hemostatic valve, and then the device was gently advanced through the microcatheter to the distal end. The retriever was then unsheathed by withdrawing the microcatheter under fluoroscopy. Once the retriever was completely unsheathed, the microcatheter was carefully stripped from the delivery device. Control angiogram was performed from the intermediate catheter. A 3 minute time interval was observed. Constant aspiration using the proprietary engine was then performed at the intermediate catheter, as the retriever was gently and slowly withdrawn with fluoroscopic observation. Once the retriever  was "corked" within the tip of the intermediate catheter, both were removed from the system. Free aspiration was confirmed at the hub of the balloon guide catheter, with free blood return confirmed. The balloon was then deflated, and a control angiogram was performed. Improved flow, with again distal migration within the inferior division. We elected to attempt a third pass. 3rd pass: The 014 synchro soft wire was loaded through a zoom 35 aspiration catheter. Roadmap angiogram was performed. The aspiration catheter and the microwire were then advanced to the inferior segment, just proximal to the occlusion site. Once the aspiration catheter was just proximal to the occlusion site the microwire was removed. The rotating hemostatic valve was then removed from the hub, and the proprietary engine was attached directly to the hub of the zoom 35 catheter the pump was turned on confirming backflow and the catheter was advanced until cessation of flow was observed. Catheter was advanced through the site of the occlusion and then withdrawn. Upon withdrawal, aspiration was performed at the hub of the balloon guide catheter. Once the aspiration catheter was completely removed, aspiration was performed at the hub of the balloon guide confirming free return of blood. Angiogram was performed. There was improved flow through the site of the occlusion, however, again distal migration was observed within the affected division, with again distal migration within the inferior division. We elected 1 final pass into the inferior division M3/M4 with the microcatheter. Fourth pass: A roadmap angiogram was performed. Through the balloon guide, a 160 cm Trevo Trak microcatheter was advanced with the synchro soft, into the distal aspect of the inferior division, targeting the residual, most proximal occlusive embolus of the inferior division branches. Once the microcatheter was distal to the site of the occlusion, the microwire was removed.  Catheter aspiration was performed as the catheter was then withdrawn. Catheter was completely withdrawn from the system. Angiogram performed demonstrated persistent occlusion in the distal M3/M4 cortical branch of the inferior division. At this time we elected to terminate the case. Balloon guide was withdrawn. A JB 1 catheter was used to select the origin of the left CCA. Angiogram was performed. JB 1 catheter was then removed. The skin at the puncture site was then cleaned with Chlorhexidine. The 8 French sheath was removed and an 57F angioseal was deployed. Flat panel CT was performed. Patient was extubated once the CT was reviewed. Patient tolerated the procedure well and remained hemodynamically stable throughout. No complications were encountered and no significant blood loss encountered. IMPRESSION: Status post ultrasound  guided access right common femoral artery for mechanical thrombectomy of right-sided MCA ELVO, with 4 passes achieving TICI 2c flow at the completion. Angiogram of the left ICA demonstrates no occlusive branches intracranial. Angio-Seal restored for hemostasis. Signed, Dulcy Fanny. Dellia Nims, RPVI [facility] Radiology PLAN: Patient extubated ICU status Target systolic blood pressure of 120-140 Right hip straight time 4 hours Frequent neurovascular checks Repeat neurologic imaging with CT and/MRI at the discretion of neurology team Electronically Signed   By: Corrie Mckusick D.O.   On: 12/18/2020 21:30   CT HEAD CODE STROKE WO CONTRAST  Result Date: 12/17/2020 CLINICAL DATA:  Code stroke.  Left-sided weakness EXAM: CT HEAD WITHOUT CONTRAST TECHNIQUE: Contiguous axial images were obtained from the base of the skull through the vertex without intravenous contrast. COMPARISON:  08/02/2019 FINDINGS: Brain: Small chronic high right frontal cortex infarct. No convincing acute infarct. No acute hemorrhage. Remote bilateral cerebellar infarcts. No  hydrocephalus or mass. Vascular: Hyperdense right M1 segment Skull: Negative Sinuses/Orbits: Negative Other: Critical Value/emergent results were called by telephone at the time of interpretation on 12/17/2020 at 5:24 am to provider [facility] , who verbally acknowledged these results. ASPECTS [facility] Stroke Program Early CT Score) - Ganglionic level infarction (caudate, lentiform nuclei, internal capsule, insula, M1-M3 cortex): 7 - Supraganglionic infarction (M4-M6 cortex): 3 when accounting for chronic cortex infarct Total score (0-10 with 10 being normal): 10 IMPRESSION: 1. Hyperdense right M1 segment, reference CTA. 2. No acute hemorrhage or visible acute infarct. 3. Remote right frontal cortex and bilateral cerebellar infarcts. Electronically Signed   By: Jorje Guild M.D.   On: 12/17/2020 05:25   IR ANGIO INTRA EXTRACRAN SEL COM CAROTID INNOMINATE UNI L MOD SED  Result Date: 12/19/2020 INDICATION: 60 year old male with a history acute stroke right M1/right MCA syndrome, presents for angiogram and possible mechanical thrombectomy EXAM: ULTRASOUND-GUIDED ACCESS RIGHT COMMON FEMORAL ARTERY CERVICAL AND CEREBRAL ANGIOGRAM MECHANICAL THROMBECTOMY RIGHT MCA FLAT PANEL CT ANGIO-SEAL FOR HEMOSTASIS COMPARISON:  CT IMAGING SAME DAY MEDICATIONS: None ANESTHESIA/SEDATION: The anesthesia team was present to provide general endotracheal tube anesthesia and for patient monitoring during the procedure. Intubation was performed in room 2 neuro IR. Left radial arterial line was performed by the anesthesia team. Interventional neuro radiology nursing staff was also present. CONTRAST:  90 cc Omni 240 FLUOROSCOPY TIME:  Fluoroscopy Time: 19 minutes 6 seconds (1588 mGy). COMPLICATIONS: SIR LEVEL B - Normal therapy, includes overnight admission for observation. Subarachnoid hemorrhage TECHNIQUE: Informed written consent was obtained from the patient's family after a thorough discussion of the procedural risks, benefits and  alternatives. Specific risks discussed include: Bleeding, infection, contrast reaction, kidney injury/failure, need for further procedure/surgery, arterial injury or dissection, embolization to new territory, intracranial hemorrhage (10-15% risk), neurologic deterioration, cardiopulmonary collapse, death. All questions were addressed. Maximal Sterile Barrier Technique was utilized including during the procedure including caps, mask, sterile gowns, sterile gloves, sterile drape, hand hygiene and skin antiseptic. A timeout was performed prior to the initiation of the procedure. The anesthesia team was present to provide general endotracheal tube anesthesia and for patient monitoring during the procedure. Interventional neuro radiology nursing staff was also present. FINDINGS: Initial Findings: Right common carotid artery:  Normal course caliber and contour. Right external carotid artery: Patent with antegrade flow. Right internal carotid artery: Normal course caliber and contour of the cervical portion. Vertical and petrous segment patent with normal course caliber contour. Cavernous segment patent. Clinoid segment patent. Antegrade flow of the ophthalmic artery. Ophthalmic  segment patent. Terminus patent. Right MCA: Occlusion was present at the branch point of superior and inferior. There had been migration of the M1 thrombus distally to the branch point, when compared to the baseline CT angiogram. The embolus had moved primarily into the inferior division, with flow maintained into the superior division on the initial angiogram. Right ACA: A 1 segment patent. Full perfusion of the right ACA territory with crossover flow into the left ACA territory via patent anterior communicating artery. Completion Findings: Right MCA: Restoration of flow through the right MCA, into the inferior and superior division branch point. The fragmented embolus entered the inferior division. The inferior division is the dominant division,  with perfusion of the temporal lobe and the parietal lobe, with the superior segment perfusing essentially the frontal region only. After treatment, there is restoration of the proximal flow, with slow flow within distal branches of the inferior division, affecting both parietal and temporal lobes, with TICI 2c flow: Near complete perfusion except for slow flow in a few distal cortical vessels/presence of small distal cortical emboli Left: Left common carotid artery:  Normal course caliber and contour. Left external carotid artery: Patent with antegrade flow. Left internal carotid artery: Normal course caliber and contour of the cervical portion. Mild atherosclerotic plaque at the origin of the left ICA without significant stenosis. Vertical and petrous segment patent with normal course caliber contour. Cavernous segment patent. Clinoid segment patent. Antegrade flow of the ophthalmic artery. Ophthalmic segment patent. Terminus patent. Left MCA: M1 segment patent. Insular and opercular segments patent. Unremarkable caliber and course of the cortical segments. Typical arterial, capillary/ parenchymal, and venous phase. Left ACA: A 1 segment patent. A 2 segment perfuses the right ACA territory. Flat panel CT: Small volume subarachnoid hemorrhage within the right sulci, [place] IIIC PROCEDURE: The anesthesia team was present to provide general endotracheal tube anesthesia and for patient monitoring during the procedure. Intubation was performed in negative pressure [place] in neuro IR holding. Interventional neuro radiology nursing staff was also present. Ultrasound survey of the right inguinal region was performed with images stored and sent to PACs. 11 blade scalpel was used to make a small incision. Blunt dissection was performed with US guidance. A micropuncture needle was used access the right common femoral artery under ultrasound. With excellent arterial blood flow returned, an .018 micro wire was passed through  the needle, observed to enter the abdominal aorta under fluoroscopy. The needle was removed, and a micropuncture sheath was placed over the wire. The inner dilator and wire were removed, and an 035 wire was advanced under fluoroscopy into the abdominal aorta. The sheath was removed and a 25cm 98F straight vascular sheath was placed. The dilator was removed and the sheath was flushed. Sheath was attached to pressurized and heparinized saline bag for constant forward flow. A coaxial system was then advanced over the 035 wire. This included a 95cm 087 "Walrus" balloon guide with coaxial 125cm Berenstein diagnostic catheter. This was advanced to the proximal descending thoracic aorta. Wire was then removed. Double flush of the catheter was performed. Catheter was then used to select the innominate artery. Angiogram was performed. Using roadmap technique, the catheter was advanced over a standard glide wire into right cervical ICA, with distal position achieved of the balloon guide. The diagnostic catheter and the wire were removed. Formal angiogram was performed. Road map function was used once the occluded vessel was identified. Copious back flush was performed and the balloon catheter was attached to heparinized and  pressurized saline bag for forward flow. A second coaxial system was then advanced through the balloon catheter, which included the selected intermediate catheter, microcatheter, and microwire. In this scenario, the set up included a zoom 71 aspiration catheter, a Trevo Provue18 microcatheter, and 014 synchro soft wire. This system was advanced through the balloon guide catheter under the road-map function, with adequate back-flush at the rotating hemostatic valve at that back end of the balloon guide. Once the wire and microcatheter were into the laceral segment, we attach heparinized saline pressured flush to the guide. Microcatheter and the intermediate catheter system were advanced through the terminal  ICA and MCA gently to the proximal M1 segment, and were advanced up to but not through the occluded segment. The zoom catheter was then advanced over the microwire into the proximal M1 segment. The microcatheter microwire were then removed. The proprietary engine was attached to the hub of the aspiration catheter confirming return of flow. Catheter was then gently advanced into the embolus/thrombus at the site of occlusion. Approximately 1 minute of time was observed and then the catheter was withdrawn. Catheter was withdrawn into the balloon guide with free aspiration performed at the hub of the aspiration catheter. Angiogram was performed, which confirmed restoration of flow through the proximal inferior segment, with distal migration of the thrombus into a distal branch. We elected to attempt a second pass. 2nd pass: Coaxial system was advanced through the balloon guide, which included a zoom 71 aspiration catheter, a Trevo Provue18 microcatheter, and 014 synchro soft wire. The system was advanced through the balloon guide catheter under the road-map function, with adequate back-flush at the rotating hemostatic valve at that back end of the balloon guide. Microcatheter and the intermediate catheter system were advanced through the terminal ICA and MCA to the level of the bifurcation of superior/inferior division. The micro wire was then carefully advanced into the inferior segment and through the occluded segment. Microcatheter was then manipulated through the occluded segment and the wire was removed with saline drip at the hub. Blood was then aspirated through the hub of the microcatheter, and a gentle contrast injection was performed confirming intraluminal position within the inferior division. A rotating hemostatic valve was then attached to the back end of the microcatheter, and a pressurized and heparinized saline bag was attached to the catheter. 4 x 40 solitaire device was then selected. Back flush was  achieved at the rotating hemostatic valve, and then the device was gently advanced through the microcatheter to the distal end. The retriever was then unsheathed by withdrawing the microcatheter under fluoroscopy. Once the retriever was completely unsheathed, the microcatheter was carefully stripped from the delivery device. Control angiogram was performed from the intermediate catheter. A 3 minute time interval was observed. Constant aspiration using the proprietary engine was then performed at the intermediate catheter, as the retriever was gently and slowly withdrawn with fluoroscopic observation. Once the retriever was "corked" within the tip of the intermediate catheter, both were removed from the system. Free aspiration was confirmed at the hub of the balloon guide catheter, with free blood return confirmed. The balloon was then deflated, and a control angiogram was performed. Improved flow, with again distal migration within the inferior division. We elected to attempt a third pass. 3rd pass: The 014 synchro soft wire was loaded through a zoom 35 aspiration catheter. Roadmap angiogram was performed. The aspiration catheter and the microwire were then advanced to the inferior segment, just proximal to the occlusion site. Once the  aspiration catheter was just proximal to the occlusion site the microwire was removed. The rotating hemostatic valve was then removed from the hub, and the proprietary engine was attached directly to the hub of the zoom 35 catheter the pump was turned on confirming backflow and the catheter was advanced until cessation of flow was observed. Catheter was advanced through the site of the occlusion and then withdrawn. Upon withdrawal, aspiration was performed at the hub of the balloon guide catheter. Once the aspiration catheter was completely removed, aspiration was performed at the hub of the balloon guide confirming free return of blood. Angiogram was performed. There was improved  flow through the site of the occlusion, however, again distal migration was observed within the affected division, with again distal migration within the inferior division. We elected 1 final pass into the inferior division M3/M4 with the microcatheter. Fourth pass: A roadmap angiogram was performed. Through the balloon guide, a 160 cm Trevo Trak microcatheter was advanced with the synchro soft, into the distal aspect of the inferior division, targeting the residual, most proximal occlusive embolus of the inferior division branches. Once the microcatheter was distal to the site of the occlusion, the microwire was removed. Catheter aspiration was performed as the catheter was then withdrawn. Catheter was completely withdrawn from the system. Angiogram performed demonstrated persistent occlusion in the distal M3/M4 cortical branch of the inferior division. At this time we elected to terminate the case. Balloon guide was withdrawn. A JB 1 catheter was used to select the origin of the left CCA. Angiogram was performed. JB 1 catheter was then removed. The skin at the puncture site was then cleaned with Chlorhexidine. The 8 French sheath was removed and an 82F angioseal was deployed. Flat panel CT was performed. Patient was extubated once the CT was reviewed. Patient tolerated the procedure well and remained hemodynamically stable throughout. No complications were encountered and no significant blood loss encountered. IMPRESSION: Status post ultrasound guided access right common femoral artery for mechanical thrombectomy of right-sided MCA ELVO, with 4 passes achieving TICI 2c flow at the completion. Angiogram of the left ICA demonstrates no occlusive branches intracranial. Angio-Seal restored for hemostasis. Signed, Dulcy Fanny. Dellia Nims, RPVI [facility] Radiology PLAN: Patient extubated ICU status Target systolic blood pressure of 120-140 Right hip straight time 4 hours  Frequent neurovascular checks Repeat neurologic imaging with CT and/MRI at the discretion of neurology team Electronically Signed   By: Corrie Mckusick D.O.   On: 12/18/2020 21:30    Labs:  CBC: Recent Labs    11/16/20 0821 12/17/20 0506 12/18/20 0322 12/19/20 0623  WBC 9.1 10.7* 13.6* 10.7*  HGB 15.4 14.6 14.7 13.4  HCT 44.9 42.6 44.1 39.4  PLT 204 185 175 145*    COAGS: Recent Labs    12/17/20 0506  INR 0.9  APTT 26    BMP: Recent Labs    11/16/20 0821 12/17/20 0506 12/18/20 0322 12/19/20 0623  NA 137 138 140 139  K 4.7 3.9 4.2 3.8  CL 105 107 106 108  CO2 21* 24 21* 23  GLUCOSE 144* 188* 101* 138*  BUN _0 CALCIUM 9.5 9.3 9.1 9.0  CREATININE 1.35* 1.34* 1.34* 1.28*  GFRNONAA >60 >60 >60 >60    LIVER FUNCTION TESTS: Recent Labs    11/16/20 0821 12/17/20 0506  BILITOT 0.6 0.5  AST 26 25  ALT 25 24  ALKPHOS 59 50  PROT 6.8 6.4*  ALBUMIN 4.3  3.9    Assessment and Plan:  CVA R MCA revascularization Speech improving daily--- can still lose words -- slur if goes too fast Plan per Neuro   Electronically Signed: Lavonia Drafts, PA-C 12/19/2020, 9:15 AM   I spent a total of 15 Minutes at the the patient's bedside AND on the patient's hospital floor or unit, greater than 50% of which was counseling/coordinating care for R MCA revasc

## 2020-12-19 NOTE — Evaluation (Signed)
Physical Therapy Evaluation and DISCHARGE Patient Details Name: Karl Morgan MRN: [id number] DOB: Dec 09, 1960 Today's Date: 12/19/2020  History of Present Illness  Karl Morgan is a 60 y.o. male with a hx of CVA, CAD with prior STEMI and PCI stents, DM-2, HTN, HLD, GERD who is being seen 12/19/2020 for the evaluation of LV thrombus at the request of Dr. Erlinda Hong. MRI revealed R MCA infarct. Pt underwent mechanical thrombectomy x4 on 9/18.   Clinical Impression  Pt functioning near baseline from mobility standpoint. Pt scored 23/23 on DGI indicating minimal falls risk. Pt with noted word finding difficulties and impaired memory. Pt eager to return home today. Pt with good home set up and support. Pt with no further acute PT needs at this time. PT SIGNING OFF.       Recommendations for follow up therapy are one component of a multi-disciplinary discharge planning process, led by the attending physician.  Recommendations may be updated based on patient status, additional functional criteria and insurance authorization.  Follow Up Recommendations No PT follow up    Equipment Recommendations  None recommended by PT    Recommendations for Other Services       Precautions / Restrictions Precautions Precautions: Other (comment) Precaution Comments: slurred speech, word finding difficulty, ptaware of speech deficits Restrictions Weight Bearing Restrictions: No      Mobility  Bed Mobility               General bed mobility comments: pt up in chair upon PT arrival    Transfers Overall transfer level: Modified independent Equipment used: None             General transfer comment: no difficulty, verbal cues to wait for PT as pt was hooked up to the IV and not unplugged fromt he wall yet  Ambulation/Gait Ambulation/Gait assistance: Independent Gait Distance (Feet): 400 Feet Assistive device: None Gait Pattern/deviations: WFL(Within Functional Limits) Gait velocity: wfl Gait  velocity interpretation: >2.62 ft/sec, indicative of community ambulatory General Gait Details: no episode of LOB  Stairs            Wheelchair Mobility    Modified Rankin (Stroke Patients Only)       Balance Overall balance assessment: No apparent balance deficits (not formally assessed) (pt able to withstand mod/max pertebations without difficulty or LOB)                               Standardized Balance Assessment Standardized Balance Assessment : Dynamic Gait Index   Dynamic Gait Index Level Surface: Normal Change in Gait Speed: Normal Gait with Horizontal Head Turns: Normal Gait with Vertical Head Turns: Normal Gait and Pivot Turn: Normal Step Over Obstacle: Normal Step Around Obstacles: Normal Steps: Normal Total Score: 24       Pertinent Vitals/Pain Pain Assessment: No/denies pain Faces Pain Scale: No hurt    Home Living Family/patient expects to be discharged to:: Private residence Living Arrangements: Spouse/significant other Available Help at Discharge: Family Type of Home: House Home Access: Stairs to enter Entrance Stairs-Rails: None Technical brewer of Steps: 1 Home Layout: One level Home Equipment: None      Prior Function Level of Independence: Independent         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   Dominant Hand: Right    Extremity/Trunk Assessment   Upper Extremity Assessment Upper Extremity Assessment: Defer to OT  evaluation    Lower Extremity Assessment Lower Extremity Assessment: Overall WFL for tasks assessed    Cervical / Trunk Assessment Cervical / Trunk Assessment: Normal  Communication   Communication: Expressive difficulties (word finding difficulties)  Cognition Arousal/Alertness: Awake/alert Behavior During Therapy: Impulsive (quick to move because "Oh I'm fine, I don't need any help") Overall Cognitive Status: Within Functional Limits for tasks  assessed                                 General Comments: suspect pt is at baseline, pt off on the date by a few days otherwise able to problem solve and follow commands, pt impulsive and "ready to go", wife reports he is at his baseline      General Comments General comments (skin integrity, edema, etc.): VSS, RR upto 35 however pt denies SOB    Exercises     Assessment/Plan    PT Assessment Patent does not need any further PT services  PT Problem List         PT Treatment Interventions      PT Goals (Current goals can be found in the Care Plan section)  Acute Rehab PT Goals Patient Stated Goal: go home today PT Goal Formulation: All assessment and education complete, DC therapy    Frequency     Barriers to discharge        Co-evaluation               AM-PAC PT "6 Clicks" Mobility  Outcome Measure Help needed turning from your back to your side while in a flat bed without using bedrails?: None Help needed moving from lying on your back to sitting on the side of a flat bed without using bedrails?: None Help needed moving to and from a bed to a chair (including a wheelchair)?: None Help needed standing up from a chair using your arms (e.g., wheelchair or bedside chair)?: None Help needed to walk in hospital room?: None Help needed climbing 3-5 steps with a railing? : None 6 Click Score: 24    End of Session Equipment Utilized During Treatment: Gait belt Activity Tolerance: Patient tolerated treatment well Patient left: in chair;with call bell/phone within reach;with chair alarm set Nurse Communication: Mobility status PT Visit Diagnosis: Unsteadiness on feet (R26.81)    Time: 0092-3300 PT Time Calculation (min) (ACUTE ONLY): 14 min   Charges:   PT Evaluation $PT Eval Low Complexity: 1 Low          Kittie Plater, PT, DPT Acute Rehabilitation Services Pager #: [phone] Office #: [phone]   Berline Lopes 12/19/2020, 2:53 PM

## 2020-12-20 ENCOUNTER — Other Ambulatory Visit: Payer: Self-pay | Admitting: Neurology

## 2020-12-20 ENCOUNTER — Other Ambulatory Visit (HOSPITAL_COMMUNITY): Payer: Self-pay

## 2020-12-20 DIAGNOSIS — I10 Essential (primary) hypertension: Secondary | ICD-10-CM

## 2020-12-20 DIAGNOSIS — E1165 Type 2 diabetes mellitus with hyperglycemia: Secondary | ICD-10-CM

## 2020-12-20 DIAGNOSIS — I63412 Cerebral infarction due to embolism of left middle cerebral artery: Secondary | ICD-10-CM

## 2020-12-20 LAB — CBC
HCT: 36.9 % — ABNORMAL LOW (ref 39.0–52.0)
Hemoglobin: 12.7 g/dL — ABNORMAL LOW (ref 13.0–17.0)
MCH: 32.5 pg (ref 26.0–34.0)
MCHC: 34.4 g/dL (ref 30.0–36.0)
MCV: 94.4 fL (ref 80.0–100.0)
Platelets: 135 10*3/uL — ABNORMAL LOW (ref 150–400)
RBC: 3.91 MIL/uL — ABNORMAL LOW (ref 4.22–5.81)
RDW: 11.9 % (ref 11.5–15.5)
WBC: 9.3 10*3/uL (ref 4.0–10.5)
nRBC: 0 % (ref 0.0–0.2)

## 2020-12-20 LAB — BASIC METABOLIC PANEL
Anion gap: 10 (ref 5–15)
BUN: 19 mg/dL (ref 8–23)
CO2: 24 mmol/L (ref 22–32)
Calcium: 9.1 mg/dL (ref 8.9–10.3)
Chloride: 101 mmol/L (ref 98–111)
Creatinine, Ser: 1.25 mg/dL — ABNORMAL HIGH (ref 0.61–1.24)
GFR, Estimated: >60 mL/min (ref >60)
Glucose, Bld: 129 mg/dL — ABNORMAL HIGH (ref 70–99)
Potassium: 3.7 mmol/L (ref 3.5–5.1)
Sodium: 135 mmol/L (ref 135–145)

## 2020-12-20 LAB — GLUCOSE, CAPILLARY: Glucose-Capillary: 145 mg/dL — ABNORMAL HIGH (ref 70–99)

## 2020-12-20 LAB — PROTIME-INR
INR: 1.2 (ref 0.8–1.2)
Prothrombin Time: 14.7 seconds (ref 11.4–15.2)

## 2020-12-20 MED ORDER — WARFARIN SODIUM 5 MG PO TABS: 5.0000 mg | ORAL_TABLET | Freq: Once | ORAL | Status: DC

## 2020-12-20 MED ORDER — EZETIMIBE 10 MG PO TABS: 10.0000 mg | ORAL_TABLET | Freq: Every day | ORAL | 0 refills | Status: DC

## 2020-12-20 MED ORDER — DAPAGLIFLOZIN PROPANEDIOL 10 MG PO TABS: 10.0000 mg | ORAL_TABLET | Freq: Every day | ORAL | 0 refills | Status: DC

## 2020-12-20 MED ORDER — METOPROLOL SUCCINATE ER 50 MG PO TB24
ORAL_TABLET | Freq: Every day | ORAL | 3 refills | Status: DC
  Filled 2020-12-20: qty 90, fill #0
  Filled 2021-02-27: qty 90, 90d supply, fill #0
  Filled 2021-05-29: qty 90, 90d supply, fill #1

## 2020-12-20 MED ORDER — WARFARIN SODIUM 5 MG PO TABS
5.0000 mg | ORAL_TABLET | Freq: Once | ORAL | 0 refills | Status: DC
Start: 2020-12-20 — End: 2020-12-20

## 2020-12-20 MED ORDER — FARXIGA 10 MG PO TABS
10.0000 mg | ORAL_TABLET | Freq: Every day | ORAL | 0 refills | Status: DC
  Filled 2020-12-20 – 2021-02-06 (×2): qty 30, 30d supply, fill #0

## 2020-12-20 MED ORDER — VALACYCLOVIR HCL 1 G PO TABS
ORAL_TABLET | ORAL | 3 refills | Status: DC
  Filled 2020-12-20: qty 30, 9d supply, fill #0

## 2020-12-20 MED ORDER — WARFARIN SODIUM 5 MG PO TABS
5.0000 mg | ORAL_TABLET | Freq: Once | ORAL | 0 refills | Status: DC
  Filled 2020-12-20: qty 90, 90d supply, fill #0

## 2020-12-20 MED ORDER — PANTOPRAZOLE SODIUM 40 MG PO TBEC: 40.0000 mg | DELAYED_RELEASE_TABLET | Freq: Every day | ORAL | 0 refills | Status: DC

## 2020-12-20 MED ORDER — EZETIMIBE 10 MG PO TABS
10.0000 mg | ORAL_TABLET | Freq: Every day | ORAL | 0 refills | Status: DC
  Filled 2020-12-20 – 2021-02-06 (×2): qty 90, 90d supply, fill #0

## 2020-12-20 MED ORDER — ENOXAPARIN SODIUM 120 MG/0.8ML IJ SOSY: 110.0000 mg | PREFILLED_SYRINGE | Freq: Two times a day (BID) | INTRAMUSCULAR | 0 refills | Status: DC

## 2020-12-20 MED ORDER — LEVOTHYROXINE SODIUM 75 MCG PO TABS
75.0000 ug | ORAL_TABLET | Freq: Every day | ORAL | 3 refills | Status: DC
  Filled 2020-12-20 – 2021-03-13 (×2): qty 90, 90d supply, fill #0
  Filled 2021-06-05: qty 90, 90d supply, fill #1

## 2020-12-20 MED ORDER — ENOXAPARIN SODIUM 120 MG/0.8ML IJ SOSY
110.0000 mg | PREFILLED_SYRINGE | Freq: Two times a day (BID) | INTRAMUSCULAR | 0 refills | Status: DC
  Filled 2020-12-20: qty 11.2, 7d supply, fill #0

## 2020-12-20 MED ORDER — ATORVASTATIN CALCIUM 80 MG PO TABS: 80.0000 mg | ORAL_TABLET | Freq: Every day | ORAL | 0 refills | Status: DC

## 2020-12-20 MED ORDER — PANTOPRAZOLE SODIUM 40 MG PO TBEC
40.0000 mg | DELAYED_RELEASE_TABLET | Freq: Every day | ORAL | 0 refills | Status: DC
  Filled 2020-12-20: qty 90, 90d supply, fill #0

## 2020-12-20 MED ORDER — LISINOPRIL 40 MG PO TABS
ORAL_TABLET | Freq: Every day | ORAL | 3 refills | Status: DC
  Filled 2020-12-20: qty 90, fill #0
  Filled 2021-02-06: qty 90, 90d supply, fill #0
  Filled 2021-05-02: qty 90, 90d supply, fill #1

## 2020-12-20 MED ORDER — ATORVASTATIN CALCIUM 80 MG PO TABS
80.0000 mg | ORAL_TABLET | Freq: Every day | ORAL | 0 refills | Status: DC
  Filled 2020-12-20 – 2021-01-30 (×2): qty 90, 90d supply, fill #0

## 2020-12-20 MED ORDER — SITAGLIPTIN PHOSPHATE 100 MG PO TABS
ORAL_TABLET | Freq: Every day | ORAL | 3 refills | Status: DC
  Filled 2020-12-20: qty 30, 30d supply, fill #0

## 2020-12-20 MED ORDER — METFORMIN HCL 850 MG PO TABS
ORAL_TABLET | Freq: Two times a day (BID) | ORAL | 3 refills | Status: DC
  Filled 2020-12-20 – 2021-02-20 (×2): qty 180, 90d supply, fill #0
  Filled 2021-05-29: qty 180, 90d supply, fill #1

## 2020-12-20 NOTE — TOC Transition Note (Signed)
Transition of Care [facility]) - CM/SW Discharge Note   Patient Details  Name: Karl Morgan MRN: [id number] Date of Birth: [birth year]  Transition of [facility] CM/SW Contact:  Pollie Friar, RN Phone Number: 12/20/2020, 2:35 PM   Clinical Narrative:    Patient is discharging home with his spouse. Outpatient arranged at [facility] per pt request. No DME needs.  Wife is able to provide supervision at home.  Pt denies issues with home medications or transportation. Wife providing transport home today.   Final next level of care: OP Rehab Barriers to Discharge: No Barriers Identified   Patient Goals and CMS Choice     Choice offered to / list presented to : Patient  Discharge Placement                       Discharge Plan and Services                                     Social Determinants of Health (SDOH) Interventions     Readmission Risk Interventions No flowsheet data found.

## 2020-12-20 NOTE — Discharge Summary (Addendum)
Stroke Discharge Summary  Patient ID: Karl Morgan    l   MRN: [id number]      DOB: Sep 08, 1960  Date of Admission: 12/17/2020 Date of Discharge: 12/20/2020  Attending Physician:  Stroke, Md, MD, Stroke MD Consultant(s):     Pharmacy , CArdiology Patient's PCP:  McLean-Scocuzza, Nino Glow, MD  DISCHARGE DIAGNOSIS:  Active Problems:   Stroke (cerebrum) ([place])   LV (left ventricular) mural thrombus   Allergies as of 12/20/2020       Reactions   Other Other (See Comments)   Rybelsus ab pain at 3 mg, 7 mg n/v   Pravachol [pravastatin Sodium] Other (See Comments)   Muscle aches        Medication List     STOP taking these medications    alprostadil 20 MCG injection Commonly known as: EDEX   aspirin EC 81 MG tablet   blood glucose meter kit and supplies   clomiPHENE 50 MG tablet Commonly known as: CLOMID   clopidogrel 75 MG tablet Commonly known as: PLAVIX   freestyle lancets   FreeStyle Libre 14 Day Reader Devi   FREESTYLE LITE test strip Generic drug: glucose blood   omeprazole 20 MG tablet Commonly known as: PRILOSEC OTC   sildenafil 100 MG tablet Commonly known as: Viagra       TAKE these medications    atorvastatin 80 MG tablet Commonly known as: LIPITOR Take 1 tablet (80 mg total) by mouth daily. Start taking on: December 21, 2020   enoxaparin 120 MG/0.8ML injection Commonly known as: LOVENOX Inject 0.74 mLs (110 mg total) into the skin every 12 (twelve) hours.   ezetimibe 10 MG tablet Commonly known as: ZETIA Take 1 tablet (10 mg total) by mouth daily. Start taking on: December 21, 2020   Farxiga 10 MG Tabs tablet Generic drug: dapagliflozin propanediol Take 1 tablet (10 mg total) by mouth daily. Start taking on: December 21, 2020   glimepiride 2 MG tablet Commonly known as: AMARYL TAKE 1 TABLET (2 MG TOTAL) BY MOUTH 2 (TWO) TIMES DAILY WITH A MEAL. What changed:  how much to take how to take this when to take this    levothyroxine 75 MCG tablet Commonly known as: SYNTHROID Take 1 tablet (75 mcg total) by mouth daily.   lisinopril 40 MG tablet Commonly known as: ZESTRIL TAKE 1 TABLET (40 MG TOTAL) BY MOUTH DAILY. What changed: how much to take   metFORMIN 850 MG tablet Commonly known as: GLUCOPHAGE TAKE 1 TABLET BY MOUTH TWICE DAILY What changed: how much to take   metoprolol succinate 50 MG 24 hr tablet Commonly known as: TOPROL-XL TAKE 1 TABLET BY MOUTH DAILY. TAKE WITH OR IMMEDIATELY FOLLOWING A MEAL. What changed: how much to take   pantoprazole 40 MG tablet Commonly known as: PROTONIX Take 1 tablet (40 mg total) by mouth daily. Start taking on: December 21, 2020   sitaGLIPtin 100 MG tablet Commonly known as: JANUVIA TAKE 1 TABLET (100 MG TOTAL) BY MOUTH DAILY. What changed: how much to take   valACYclovir 1000 MG tablet Commonly known as: VALTREX take 1 to 2 tablets by mouth every 12 hours for 2 to 3 days as needed for fever blisters What changed:  how much to take how to take this when to take this reasons to take this   warfarin 5 MG tablet Commonly known as: COUMADIN Take 1 tablet (5 mg total) by mouth one time only at 4 PM.  LABORATORY STUDIES CBC    Component Value Date/Time   WBC 9.3 12/20/2020 0337   RBC 3.91 (L) 12/20/2020 0337   HGB 12.7 (L) 12/20/2020 0337   HGB 14.0 06/24/2013 0611   HCT 36.9 (L) 12/20/2020 0337   HCT 40.5 06/24/2013 0611   PLT 135 (L) 12/20/2020 0337   PLT 159 06/24/2013 0611   MCV 94.4 12/20/2020 0337   MCV 92 06/24/2013 0611   MCH 32.5 12/20/2020 0337   MCHC 34.4 12/20/2020 0337   RDW 11.9 12/20/2020 0337   RDW 12.8 06/24/2013 0611   LYMPHSABS 2.9 12/17/2020 0506   LYMPHSABS 1.7 06/24/2013 0611   MONOABS 0.9 12/17/2020 0506   MONOABS 1.0 06/24/2013 0611   EOSABS 0.2 12/17/2020 0506   EOSABS 0.1 06/24/2013 0611   BASOSABS 0.1 12/17/2020 0506   BASOSABS 0.0 06/24/2013 0611   CMP    Component Value Date/Time   NA  135 12/20/2020 0337   NA 133 (L) 06/25/2013 0522   K 3.7 12/20/2020 0337   K 3.6 06/25/2013 0522   CL 101 12/20/2020 0337   CL 103 06/25/2013 0522   CO2 24 12/20/2020 0337   CO2 27 06/25/2013 0522   GLUCOSE 129 (H) 12/20/2020 0337   GLUCOSE 142 (H) 06/25/2013 0522   BUN 19 12/20/2020 0337   BUN 17 06/25/2013 0522   CREATININE 1.25 (H) 12/20/2020 0337   CREATININE 1.23 08/22/2017 1005   CALCIUM 9.1 12/20/2020 0337   CALCIUM 8.3 (L) 06/25/2013 0522   PROT 6.4 (L) 12/17/2020 0506   PROT 7.6 06/23/2013 0920   ALBUMIN 3.9 12/17/2020 0506   ALBUMIN 4.3 06/23/2013 0920   AST 25 12/17/2020 0506   AST 101 (H) 06/23/2013 0920   ALT 24 12/17/2020 0506   ALT 53 06/23/2013 0920   ALKPHOS 50 12/17/2020 0506   ALKPHOS 97 06/23/2013 0920   BILITOT 0.5 12/17/2020 0506   BILITOT 0.8 06/23/2013 0920   GFRNONAA >60 12/20/2020 0337   GFRNONAA 65 08/22/2017 1005   GFRAA >60 11/03/2019 0901   GFRAA 75 08/22/2017 1005   COAGS Lab Results  Component Value Date   INR 1.2 12/20/2020   INR 0.9 12/17/2020   INR 1.0 07/31/2019   Lipid Panel    Component Value Date/Time   CHOL 91 12/18/2020 0322   CHOL 109 09/22/2013 0908   TRIG 74 12/18/2020 0322   TRIG 99 09/22/2013 0908   HDL 27 (L) 12/18/2020 0322   HDL 25 (L) 09/22/2013 0908   CHOLHDL 3.4 12/18/2020 0322   VLDL 15 12/18/2020 0322   VLDL 20 09/22/2013 0908   LDLCALC 49 12/18/2020 0322   LDLCALC 71 08/22/2017 1005   LDLCALC 64 09/22/2013 0908   HgbA1C  Lab Results  Component Value Date   HGBA1C 6.7 (H) 12/18/2020   Urinalysis    Component Value Date/Time   COLORURINE COLORLESS (A) 07/31/2019 2330   APPEARANCEUR CLEAR 07/31/2019 2330   APPEARANCEUR Clear 06/23/2013 1942   LABSPEC 1.011 07/31/2019 2330   LABSPEC 1.023 06/23/2013 1942   PHURINE 7.0 07/31/2019 2330   GLUCOSEU >=500 (A) 07/31/2019 2330   GLUCOSEU >=500 06/23/2013 1942   GLUCOSEU >=1000 12/05/2011 1615   HGBUR NEGATIVE 07/31/2019 2330   BILIRUBINUR NEGATIVE  07/31/2019 2330   BILIRUBINUR neg 11/28/2017 1439   KETONESUR 5 (A) 07/31/2019 2330   PROTEINUR NEGATIVE 07/31/2019 2330   UROBILINOGEN 0.2 11/28/2017 1439   UROBILINOGEN 0.2 12/05/2011 1615   NITRITE NEGATIVE 07/31/2019 2330   LEUKOCYTESUR NEGATIVE 07/31/2019 2330  LEUKOCYTESUR Negative 06/23/2013 1942   Urine Drug Screen     Component Value Date/Time   LABOPIA NONE DETECTED 07/31/2019 2330   COCAINSCRNUR NONE DETECTED 07/31/2019 2330   LABBENZ NONE DETECTED 07/31/2019 2330   AMPHETMU NONE DETECTED 07/31/2019 2330   THCU NONE DETECTED 07/31/2019 2330   LABBARB NONE DETECTED 07/31/2019 2330    Alcohol Level    Component Value Date/Time   ETH <10 07/31/2019 1938     SIGNIFICANT DIAGNOSTIC STUDIES CT Angio Head W or Wo Contrast  Result Date: 12/17/2020 CLINICAL DATA:  Left-sided weakness EXAM: CT ANGIOGRAPHY HEAD AND NECK TECHNIQUE: Multidetector CT imaging of the head and neck was performed using the standard protocol during bolus administration of intravenous contrast. Multiplanar CT image reconstructions and MIPs were obtained to evaluate the vascular anatomy. Carotid stenosis measurements (when applicable) are obtained utilizing NASCET criteria, using the distal internal carotid diameter as the denominator. CONTRAST:  30m OMNIPAQUE IOHEXOL 350 MG/ML SOLN COMPARISON:  07/31/2019 FINDINGS: CTA NECK FINDINGS Aortic arch: Standard branching. Imaged portion shows no evidence of aneurysm or dissection. No significant stenosis of the major arch vessel origins. Right carotid system: Mixed density plaque at the bifurcation without flow limiting stenosis or ulceration. Left carotid system: Mixed density plaque at the bifurcation without flow limiting stenosis or ulceration. Vertebral arteries: No proximal subclavian stenosis. Strong left vertebral artery dominance. Skeleton: Degenerative endplate and facet spurring. Other neck: Negative Upper chest: Coronary calcification. Review of the MIP  images confirms the above findings CTA HEAD FINDINGS Anterior circulation: Atheromatous calcification affecting the carotid siphons. Abrupt cut off at the distal right M1 segment with some downstream branch reconstitution. Negative for aneurysm Posterior circulation: Basilar is fed solely by the left vertebral artery. Fetal type bilateral PCA flow. No branch occlusion, beading, or aneurysm. Venous sinuses: Unremarkable for the arterial phase Anatomic variants: As above Review of the MIP images confirms the above findings Critical Value/emergent results were called by telephone at the time of interpretation on 12/17/2020 at 5:24 am [facility], who verbally acknowledged these results. IMPRESSION: 1. Emergent large vessel occlusion at the right M1 segment. 2. Nonobstructive atherosclerosis of the cervical and intracranial carotids. Electronically Signed   By: JJorje GuildM.D.   On: 12/17/2020 05:30   CT Angio Neck W and/or Wo Contrast  Result Date: 12/17/2020 CLINICAL DATA:  Left-sided weakness EXAM: CT ANGIOGRAPHY HEAD AND NECK TECHNIQUE: Multidetector CT imaging of the head and neck was performed using the standard protocol during bolus administration of intravenous contrast. Multiplanar CT image reconstructions and MIPs were obtained to evaluate the vascular anatomy. Carotid stenosis measurements (when applicable) are obtained utilizing NASCET criteria, using the distal internal carotid diameter as the denominator. CONTRAST:  71mOMNIPAQUE IOHEXOL 350 MG/ML SOLN COMPARISON:  07/31/2019 FINDINGS: CTA NECK FINDINGS Aortic arch: Standard branching. Imaged portion shows no evidence of aneurysm or dissection. No significant stenosis of the major arch vessel origins. Right carotid system: Mixed density plaque at the bifurcation without flow limiting stenosis or ulceration. Left carotid system: Mixed density plaque at the bifurcation without flow limiting stenosis or ulceration. Vertebral arteries:  No proximal subclavian stenosis. Strong left vertebral artery dominance. Skeleton: Degenerative endplate and facet spurring. Other neck: Negative Upper chest: Coronary calcification. Review of the MIP images confirms the above findings CTA HEAD FINDINGS Anterior circulation: Atheromatous calcification affecting the carotid siphons. Abrupt cut off at the distal right M1 segment with some downstream branch reconstitution. Negative for aneurysm Posterior circulation: Basilar is fed  solely by the left vertebral artery. Fetal type bilateral PCA flow. No branch occlusion, beading, or aneurysm. Venous sinuses: Unremarkable for the arterial phase Anatomic variants: As above Review of the MIP images confirms the above findings Critical Value/emergent results were called by telephone at the time of interpretation on 12/17/2020 at 5:24 am to provider [facility] , who verbally acknowledged these results. IMPRESSION: 1. Emergent large vessel occlusion at the right M1 segment. 2. Nonobstructive atherosclerosis of the cervical and intracranial carotids. Electronically Signed   By: Jorje Guild M.D.   On: 12/17/2020 05:30   MR ANGIO HEAD WO CONTRAST  Result Date: 12/17/2020 CLINICAL DATA:  Stroke follow-up. Status post thrombectomy for treatment of M1 occlusion. EXAM: MRI HEAD WITHOUT CONTRAST MRA HEAD WITHOUT CONTRAST TECHNIQUE: Multiplanar, multi-echo pulse sequences of the brain and surrounding structures were acquired without intravenous contrast. Angiographic images of the Circle of Willis were acquired using MRA technique without intravenous contrast. COMPARISON:  Head CT and CTA 12/17/2020.  Head MRI 08/01/2019. FINDINGS: MRI HEAD FINDINGS Brain: There is a small to moderate-sized acute right MCA infarct involving portions of the temporal, frontal, and parietal lobes and insula with associated petechial hemorrhage. A small chronic right frontal cortical infarct is again noted, and there are chronic bilateral  cerebellar infarcts with associated chronic blood products. The ventricles are normal in size. No mass, midline shift, or extra-axial fluid collection is identified. Vascular: More fully evaluated below. Skull and upper cervical spine: Unremarkable bone marrow signal. Sinuses/Orbits: Unremarkable orbits. Mild mucosal thickening in the paranasal sinuses. Small right and trace left mastoid effusions. Other: None. MRA HEAD FINDINGS Anterior circulation: The internal carotid arteries are widely patent from skull base to carotid termini. ACAs and MCAs are patent without evidence of a proximal branch occlusion or significant proximal stenosis. Specifically, the recanalized right M1 segment remains patent without an underlying stenosis. No aneurysm is identified. Posterior circulation: The included intracranial portion of the left vertebral artery is widely patent and supplies the basilar. The right vertebral artery is not visualized and was shown to be hypoplastic on the earlier CTA. The basilar artery is widely patent. There is a large right posterior communicating artery. 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 No significant proximal PCA stenosis is evident. No aneurysm is identified. Anatomic variants: As above. IMPRESSION: 1. Small to moderate-sized acute right MCA infarct. 2. Chronic right frontal and bilateral cerebellar infarcts. 3. Negative head MRA. Electronically Signed   By: Logan Bores M.D.   On: 12/17/2020 14:55   MR BRAIN WO CONTRAST  Result Date: 12/17/2020 CLINICAL DATA:  Stroke follow-up. Status post thrombectomy for treatment of M1 occlusion. EXAM: MRI HEAD WITHOUT CONTRAST MRA HEAD WITHOUT CONTRAST TECHNIQUE: Multiplanar, multi-echo pulse sequences of the brain and surrounding structures were acquired without intravenous contrast. Angiographic images of the  Circle of Willis were acquired using MRA technique without intravenous contrast. COMPARISON:  Head CT and CTA 12/17/2020.  Head MRI 08/01/2019. FINDINGS: MRI HEAD FINDINGS Brain: There is a small to moderate-sized acute right MCA infarct involving portions of the temporal, frontal, and parietal lobes and insula with associated petechial hemorrhage. A small chronic right frontal cortical infarct is again noted, and there are chronic bilateral cerebellar infarcts with associated chronic blood products. The ventricles are normal in size. No mass, midline shift, or extra-axial fluid collection is identified. Vascular: More fully evaluated below.  Skull and upper cervical spine: Unremarkable bone marrow signal. Sinuses/Orbits: Unremarkable orbits. Mild mucosal thickening in the paranasal sinuses. Small right and trace left mastoid effusions. Other: None. MRA HEAD FINDINGS Anterior circulation: The internal carotid arteries are widely patent from skull base to carotid termini. ACAs and MCAs are patent without evidence of a proximal branch occlusion or significant proximal stenosis. Specifically, the recanalized right M1 segment remains patent without an underlying stenosis. No aneurysm is identified. Posterior circulation: The included intracranial portion of the left vertebral artery is widely patent and supplies the basilar. The right vertebral artery is not visualized and was shown to be hypoplastic on the earlier CTA. The basilar artery is widely patent. There is a large right posterior communicating artery. 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 No significant proximal PCA stenosis is evident. No aneurysm is identified. Anatomic variants: As above. IMPRESSION: 1. Small to moderate-sized acute right MCA infarct. 2. Chronic right frontal and bilateral cerebellar infarcts. 3.  Negative head MRA. Electronically Signed   By: Logan Bores M.D.   On: 12/17/2020 14:55   IR CT Head Ltd  Result Date: 12/19/2020 INDICATION: 60 year old male with a history acute stroke right M1/right MCA syndrome, presents for angiogram and possible mechanical thrombectomy EXAM: ULTRASOUND-GUIDED ACCESS RIGHT COMMON FEMORAL ARTERY CERVICAL AND CEREBRAL ANGIOGRAM MECHANICAL THROMBECTOMY RIGHT MCA FLAT PANEL CT ANGIO-SEAL FOR HEMOSTASIS COMPARISON:  CT IMAGING SAME DAY MEDICATIONS: None ANESTHESIA/SEDATION: The anesthesia team was present to provide general endotracheal tube anesthesia and for patient monitoring during the procedure. Intubation was performed in room 2 neuro IR. Left radial arterial line was performed by the anesthesia team. Interventional neuro radiology nursing staff was also present. CONTRAST:  90 cc Omni 240 FLUOROSCOPY TIME:  Fluoroscopy Time: 19 minutes 6 seconds (1588 mGy). COMPLICATIONS: SIR LEVEL B - Normal therapy, includes overnight admission for observation. Subarachnoid hemorrhage TECHNIQUE: Informed written consent was obtained from the patient's family after a thorough discussion of the procedural risks, benefits and alternatives. Specific risks discussed include: Bleeding, infection, contrast reaction, kidney injury/failure, need for further procedure/surgery, arterial injury or dissection, embolization to new territory, intracranial hemorrhage (10-15% risk), neurologic deterioration, cardiopulmonary collapse, death. All questions were addressed. Maximal Sterile Barrier Technique was utilized including during the procedure including caps, mask, sterile gowns, sterile gloves, sterile drape, hand hygiene and skin antiseptic. A timeout was performed prior to the initiation of the procedure. The anesthesia team was present to provide general endotracheal tube anesthesia and for patient monitoring during the procedure. Interventional neuro radiology nursing staff was also present.  FINDINGS: Initial Findings: Right common carotid artery:  Normal course caliber and contour. Right external carotid artery: Patent with antegrade flow. Right internal carotid artery: Normal course caliber and contour of the cervical portion. Vertical and petrous segment patent with normal course caliber contour. Cavernous segment patent. Clinoid segment patent. Antegrade flow of the ophthalmic artery. Ophthalmic segment patent. Terminus patent. Right MCA: Occlusion was present at the branch point of superior and inferior. There had been migration of the M1 thrombus distally to the branch point, when compared to the baseline CT angiogram. The embolus had moved primarily into the inferior division, with flow maintained into the superior division on the initial angiogram. Right ACA: A 1 segment patent. Full perfusion of the right ACA territory with crossover flow into the left ACA territory via patent anterior communicating artery. Completion Findings:  Right MCA: Restoration of flow through the right MCA, into the inferior and superior division branch point. The fragmented embolus entered the inferior division. The inferior division is the dominant division, with perfusion of the temporal lobe and the parietal lobe, with the superior segment perfusing essentially the frontal region only. After treatment, there is restoration of the proximal flow, with slow flow within distal branches of the inferior division, affecting both parietal and temporal lobes, with TICI 2c flow: Near complete perfusion except for slow flow in a few distal cortical vessels/presence of small distal cortical emboli Left: Left common carotid artery:  Normal course caliber and contour. Left external carotid artery: Patent with antegrade flow. Left internal carotid artery: Normal course caliber and contour of the cervical portion. Mild atherosclerotic plaque at the origin of the left ICA without significant stenosis. Vertical and petrous segment  patent with normal course caliber contour. Cavernous segment patent. Clinoid segment patent. Antegrade flow of the ophthalmic artery. Ophthalmic segment patent. Terminus patent. Left MCA: M1 segment patent. Insular and opercular segments patent. Unremarkable caliber and course of the cortical segments. Typical arterial, capillary/ parenchymal, and venous phase. Left ACA: A 1 segment patent. A 2 segment perfuses the right ACA territory. Flat panel CT: Small volume subarachnoid hemorrhage within the right sulci, [place] IIIC PROCEDURE: The anesthesia team was present to provide general endotracheal tube anesthesia and for patient monitoring during the procedure. Intubation was performed in negative pressure [place] in neuro IR holding. Interventional neuro radiology nursing staff was also present. Ultrasound survey of the right inguinal region was performed with images stored and sent to PACs. 11 blade scalpel was used to make a small incision. Blunt dissection was performed with US guidance. A micropuncture needle was used access the right common femoral artery under ultrasound. With excellent arterial blood flow returned, an .018 micro wire was passed through the needle, observed to enter the abdominal aorta under fluoroscopy. The needle was removed, and a micropuncture sheath was placed over the wire. The inner dilator and wire were removed, and an 035 wire was advanced under fluoroscopy into the abdominal aorta. The sheath was removed and a 25cm 39F straight vascular sheath was placed. The dilator was removed and the sheath was flushed. Sheath was attached to pressurized and heparinized saline bag for constant forward flow. A coaxial system was then advanced over the 035 wire. This included a 95cm 087 "Walrus" balloon guide with coaxial 125cm Berenstein diagnostic catheter. This was advanced to the proximal descending thoracic aorta. Wire was then removed. Double flush of the catheter was performed. Catheter was  then used to select the innominate artery. Angiogram was performed. Using roadmap technique, the catheter was advanced over a standard glide wire into right cervical ICA, with distal position achieved of the balloon guide. The diagnostic catheter and the wire were removed. Formal angiogram was performed. Road map function was used once the occluded vessel was identified. Copious back flush was performed and the balloon catheter was attached to heparinized and pressurized saline bag for forward flow. A second coaxial system was then advanced through the balloon catheter, which included the selected intermediate catheter, microcatheter, and microwire. In this scenario, the set up included a zoom 71 aspiration catheter, a Trevo Provue18 microcatheter, and 014 synchro soft wire. This system was advanced through the balloon guide catheter under the road-map function, with adequate back-flush at the rotating hemostatic valve at that back end of the balloon guide. Once the wire and microcatheter were into the laceral  segment, we attach heparinized saline pressured flush to the guide. Microcatheter and the intermediate catheter system were advanced through the terminal ICA and MCA gently to the proximal M1 segment, and were advanced up to but not through the occluded segment. The zoom catheter was then advanced over the microwire into the proximal M1 segment. The microcatheter microwire were then removed. The proprietary engine was attached to the hub of the aspiration catheter confirming return of flow. Catheter was then gently advanced into the embolus/thrombus at the site of occlusion. Approximately 1 minute of time was observed and then the catheter was withdrawn. Catheter was withdrawn into the balloon guide with free aspiration performed at the hub of the aspiration catheter. Angiogram was performed, which confirmed restoration of flow through the proximal inferior segment, with distal migration of the thrombus into a  distal branch. We elected to attempt a second pass. 2nd pass: Coaxial system was advanced through the balloon guide, which included a zoom 71 aspiration catheter, a Trevo Provue18 microcatheter, and 014 synchro soft wire. The system was advanced through the balloon guide catheter under the road-map function, with adequate back-flush at the rotating hemostatic valve at that back end of the balloon guide. Microcatheter and the intermediate catheter system were advanced through the terminal ICA and MCA to the level of the bifurcation of superior/inferior division. The micro wire was then carefully advanced into the inferior segment and through the occluded segment. Microcatheter was then manipulated through the occluded segment and the wire was removed with saline drip at the hub. Blood was then aspirated through the hub of the microcatheter, and a gentle contrast injection was performed confirming intraluminal position within the inferior division. A rotating hemostatic valve was then attached to the back end of the microcatheter, and a pressurized and heparinized saline bag was attached to the catheter. 4 x 40 solitaire device was then selected. Back flush was achieved at the rotating hemostatic valve, and then the device was gently advanced through the microcatheter to the distal end. The retriever was then unsheathed by withdrawing the microcatheter under fluoroscopy. Once the retriever was completely unsheathed, the microcatheter was carefully stripped from the delivery device. Control angiogram was performed from the intermediate catheter. A 3 minute time interval was observed. Constant aspiration using the proprietary engine was then performed at the intermediate catheter, as the retriever was gently and slowly withdrawn with fluoroscopic observation. Once the retriever was "corked" within the tip of the intermediate catheter, both were removed from the system. Free aspiration was confirmed at the hub of the  balloon guide catheter, with free blood return confirmed. The balloon was then deflated, and a control angiogram was performed. Improved flow, with again distal migration within the inferior division. We elected to attempt a third pass. 3rd pass: The 014 synchro soft wire was loaded through a zoom 35 aspiration catheter. Roadmap angiogram was performed. The aspiration catheter and the microwire were then advanced to the inferior segment, just proximal to the occlusion site. Once the aspiration catheter was just proximal to the occlusion site the microwire was removed. The rotating hemostatic valve was then removed from the hub, and the proprietary engine was attached directly to the hub of the zoom 35 catheter the pump was turned on confirming backflow and the catheter was advanced until cessation of flow was observed. Catheter was advanced through the site of the occlusion and then withdrawn. Upon withdrawal, aspiration was performed at the hub of the balloon guide catheter. Once the aspiration catheter  was completely removed, aspiration was performed at the hub of the balloon guide confirming free return of blood. Angiogram was performed. There was improved flow through the site of the occlusion, however, again distal migration was observed within the affected division, with again distal migration within the inferior division. We elected 1 final pass into the inferior division M3/M4 with the microcatheter. Fourth pass: A roadmap angiogram was performed. Through the balloon guide, a 160 cm Trevo Trak microcatheter was advanced with the synchro soft, into the distal aspect of the inferior division, targeting the residual, most proximal occlusive embolus of the inferior division branches. Once the microcatheter was distal to the site of the occlusion, the microwire was removed. Catheter aspiration was performed as the catheter was then withdrawn. Catheter was completely withdrawn from the system. Angiogram performed  demonstrated persistent occlusion in the distal M3/M4 cortical branch of the inferior division. At this time we elected to terminate the case. Balloon guide was withdrawn. A JB 1 catheter was used to select the origin of the left CCA. Angiogram was performed. JB 1 catheter was then removed. The skin at the puncture site was then cleaned with Chlorhexidine. The 8 French sheath was removed and an 34F angioseal was deployed. Flat panel CT was performed. Patient was extubated once the CT was reviewed. Patient tolerated the procedure well and remained hemodynamically stable throughout. No complications were encountered and no significant blood loss encountered. IMPRESSION: Status post ultrasound guided access right common femoral artery for mechanical thrombectomy of right-sided MCA ELVO, with 4 passes achieving TICI 2c flow at the completion. Angiogram of the left ICA demonstrates no occlusive branches intracranial. Angio-Seal restored for hemostasis. Signed, Dulcy Fanny. Dellia Nims, RPVI [facility] Radiology PLAN: Patient extubated ICU status Target systolic blood pressure of 120-140 Right hip straight time 4 hours Frequent neurovascular checks Repeat neurologic imaging with CT and/MRI at the discretion of neurology team Electronically Signed   By: Corrie Mckusick D.O.   On: 12/18/2020 21:30   IR US Guide Vasc Access Right  Result Date: 12/19/2020 INDICATION: 60 year old male with a history acute stroke right M1/right MCA syndrome, presents for angiogram and possible mechanical thrombectomy EXAM: ULTRASOUND-GUIDED ACCESS RIGHT COMMON FEMORAL ARTERY CERVICAL AND CEREBRAL ANGIOGRAM MECHANICAL THROMBECTOMY RIGHT MCA FLAT PANEL CT ANGIO-SEAL FOR HEMOSTASIS COMPARISON:  CT IMAGING SAME DAY MEDICATIONS: None ANESTHESIA/SEDATION: The anesthesia team was present to provide general endotracheal tube anesthesia and for patient monitoring during the procedure. Intubation was  performed in room 2 neuro IR. Left radial arterial line was performed by the anesthesia team. Interventional neuro radiology nursing staff was also present. CONTRAST:  90 cc Omni 240 FLUOROSCOPY TIME:  Fluoroscopy Time: 19 minutes 6 seconds (1588 mGy). COMPLICATIONS: SIR LEVEL B - Normal therapy, includes overnight admission for observation. Subarachnoid hemorrhage TECHNIQUE: Informed written consent was obtained from the patient's family after a thorough discussion of the procedural risks, benefits and alternatives. Specific risks discussed include: Bleeding, infection, contrast reaction, kidney injury/failure, need for further procedure/surgery, arterial injury or dissection, embolization to new territory, intracranial hemorrhage (10-15% risk), neurologic deterioration, cardiopulmonary collapse, death. All questions were addressed. Maximal Sterile Barrier Technique was utilized including during the procedure including caps, mask, sterile gowns, sterile gloves, sterile drape, hand hygiene and skin antiseptic. A timeout was performed prior to the initiation of the procedure. The anesthesia team was present to provide general endotracheal tube anesthesia and for patient monitoring during the procedure. Interventional neuro radiology nursing staff was also present. FINDINGS:  Initial Findings: Right common carotid artery:  Normal course caliber and contour. Right external carotid artery: Patent with antegrade flow. Right internal carotid artery: Normal course caliber and contour of the cervical portion. Vertical and petrous segment patent with normal course caliber contour. Cavernous segment patent. Clinoid segment patent. Antegrade flow of the ophthalmic artery. Ophthalmic segment patent. Terminus patent. Right MCA: Occlusion was present at the branch point of superior and inferior. There had been migration of the M1 thrombus distally to the branch point, when compared to the baseline CT angiogram. The embolus had  moved primarily into the inferior division, with flow maintained into the superior division on the initial angiogram. Right ACA: A 1 segment patent. Full perfusion of the right ACA territory with crossover flow into the left ACA territory via patent anterior communicating artery. Completion Findings: Right MCA: Restoration of flow through the right MCA, into the inferior and superior division branch point. The fragmented embolus entered the inferior division. The inferior division is the dominant division, with perfusion of the temporal lobe and the parietal lobe, with the superior segment perfusing essentially the frontal region only. After treatment, there is restoration of the proximal flow, with slow flow within distal branches of the inferior division, affecting both parietal and temporal lobes, with TICI 2c flow: Near complete perfusion except for slow flow in a few distal cortical vessels/presence of small distal cortical emboli Left: Left common carotid artery:  Normal course caliber and contour. Left external carotid artery: Patent with antegrade flow. Left internal carotid artery: Normal course caliber and contour of the cervical portion. Mild atherosclerotic plaque at the origin of the left ICA without significant stenosis. Vertical and petrous segment patent with normal course caliber contour. Cavernous segment patent. Clinoid segment patent. Antegrade flow of the ophthalmic artery. Ophthalmic segment patent. Terminus patent. Left MCA: M1 segment patent. Insular and opercular segments patent. Unremarkable caliber and course of the cortical segments. Typical arterial, capillary/ parenchymal, and venous phase. Left ACA: A 1 segment patent. A 2 segment perfuses the right ACA territory. Flat panel CT: Small volume subarachnoid hemorrhage within the right sulci, [place] IIIC PROCEDURE: The anesthesia team was present to provide general endotracheal tube anesthesia and for patient monitoring during the  procedure. Intubation was performed in negative pressure [place] in neuro IR holding. Interventional neuro radiology nursing staff was also present. Ultrasound survey of the right inguinal region was performed with images stored and sent to PACs. 11 blade scalpel was used to make a small incision. Blunt dissection was performed with US guidance. A micropuncture needle was used access the right common femoral artery under ultrasound. With excellent arterial blood flow returned, an .018 micro wire was passed through the needle, observed to enter the abdominal aorta under fluoroscopy. The needle was removed, and a micropuncture sheath was placed over the wire. The inner dilator and wire were removed, and an 035 wire was advanced under fluoroscopy into the abdominal aorta. The sheath was removed and a 25cm 29F straight vascular sheath was placed. The dilator was removed and the sheath was flushed. Sheath was attached to pressurized and heparinized saline bag for constant forward flow. A coaxial system was then advanced over the 035 wire. This included a 95cm 087 "Walrus" balloon guide with coaxial 125cm Berenstein diagnostic catheter. This was advanced to the proximal descending thoracic aorta. Wire was then removed. Double flush of the catheter was performed. Catheter was then used to select the innominate artery. Angiogram was performed. Using roadmap technique, the catheter  was advanced over a standard glide wire into right cervical ICA, with distal position achieved of the balloon guide. The diagnostic catheter and the wire were removed. Formal angiogram was performed. Road map function was used once the occluded vessel was identified. Copious back flush was performed and the balloon catheter was attached to heparinized and pressurized saline bag for forward flow. A second coaxial system was then advanced through the balloon catheter, which included the selected intermediate catheter, microcatheter, and microwire. In  this scenario, the set up included a zoom 71 aspiration catheter, a Trevo Provue18 microcatheter, and 014 synchro soft wire. This system was advanced through the balloon guide catheter under the road-map function, with adequate back-flush at the rotating hemostatic valve at that back end of the balloon guide. Once the wire and microcatheter were into the laceral segment, we attach heparinized saline pressured flush to the guide. Microcatheter and the intermediate catheter system were advanced through the terminal ICA and MCA gently to the proximal M1 segment, and were advanced up to but not through the occluded segment. The zoom catheter was then advanced over the microwire into the proximal M1 segment. The microcatheter microwire were then removed. The proprietary engine was attached to the hub of the aspiration catheter confirming return of flow. Catheter was then gently advanced into the embolus/thrombus at the site of occlusion. Approximately 1 minute of time was observed and then the catheter was withdrawn. Catheter was withdrawn into the balloon guide with free aspiration performed at the hub of the aspiration catheter. Angiogram was performed, which confirmed restoration of flow through the proximal inferior segment, with distal migration of the thrombus into a distal branch. We elected to attempt a second pass. 2nd pass: Coaxial system was advanced through the balloon guide, which included a zoom 71 aspiration catheter, a Trevo Provue18 microcatheter, and 014 synchro soft wire. The system was advanced through the balloon guide catheter under the road-map function, with adequate back-flush at the rotating hemostatic valve at that back end of the balloon guide. Microcatheter and the intermediate catheter system were advanced through the terminal ICA and MCA to the level of the bifurcation of superior/inferior division. The micro wire was then carefully advanced into the inferior segment and through the  occluded segment. Microcatheter was then manipulated through the occluded segment and the wire was removed with saline drip at the hub. Blood was then aspirated through the hub of the microcatheter, and a gentle contrast injection was performed confirming intraluminal position within the inferior division. A rotating hemostatic valve was then attached to the back end of the microcatheter, and a pressurized and heparinized saline bag was attached to the catheter. 4 x 40 solitaire device was then selected. Back flush was achieved at the rotating hemostatic valve, and then the device was gently advanced through the microcatheter to the distal end. The retriever was then unsheathed by withdrawing the microcatheter under fluoroscopy. Once the retriever was completely unsheathed, the microcatheter was carefully stripped from the delivery device. Control angiogram was performed from the intermediate catheter. A 3 minute time interval was observed. Constant aspiration using the proprietary engine was then performed at the intermediate catheter, as the retriever was gently and slowly withdrawn with fluoroscopic observation. Once the retriever was "corked" within the tip of the intermediate catheter, both were removed from the system. Free aspiration was confirmed at the hub of the balloon guide catheter, with free blood return confirmed. The balloon was then deflated, and a control angiogram was performed. Improved  flow, with again distal migration within the inferior division. We elected to attempt a third pass. 3rd pass: The 014 synchro soft wire was loaded through a zoom 35 aspiration catheter. Roadmap angiogram was performed. The aspiration catheter and the microwire were then advanced to the inferior segment, just proximal to the occlusion site. Once the aspiration catheter was just proximal to the occlusion site the microwire was removed. The rotating hemostatic valve was then removed from the hub, and the proprietary  engine was attached directly to the hub of the zoom 35 catheter the pump was turned on confirming backflow and the catheter was advanced until cessation of flow was observed. Catheter was advanced through the site of the occlusion and then withdrawn. Upon withdrawal, aspiration was performed at the hub of the balloon guide catheter. Once the aspiration catheter was completely removed, aspiration was performed at the hub of the balloon guide confirming free return of blood. Angiogram was performed. There was improved flow through the site of the occlusion, however, again distal migration was observed within the affected division, with again distal migration within the inferior division. We elected 1 final pass into the inferior division M3/M4 with the microcatheter. Fourth pass: A roadmap angiogram was performed. Through the balloon guide, a 160 cm Trevo Trak microcatheter was advanced with the synchro soft, into the distal aspect of the inferior division, targeting the residual, most proximal occlusive embolus of the inferior division branches. Once the microcatheter was distal to the site of the occlusion, the microwire was removed. Catheter aspiration was performed as the catheter was then withdrawn. Catheter was completely withdrawn from the system. Angiogram performed demonstrated persistent occlusion in the distal M3/M4 cortical branch of the inferior division. At this time we elected to terminate the case. Balloon guide was withdrawn. A JB 1 catheter was used to select the origin of the left CCA. Angiogram was performed. JB 1 catheter was then removed. The skin at the puncture site was then cleaned with Chlorhexidine. The 8 French sheath was removed and an 67F angioseal was deployed. Flat panel CT was performed. Patient was extubated once the CT was reviewed. Patient tolerated the procedure well and remained hemodynamically stable throughout. No complications were encountered and no significant blood loss  encountered. IMPRESSION: Status post ultrasound guided access right common femoral artery for mechanical thrombectomy of right-sided MCA ELVO, with 4 passes achieving TICI 2c flow at the completion. Angiogram of the left ICA demonstrates no occlusive branches intracranial. Angio-Seal restored for hemostasis. Signed, Dulcy Fanny. Dellia Nims, RPVI Vascular and Interventional Radiology Specialists [facility] Radiology PLAN: Patient extubated ICU status Target systolic blood pressure of 120-140 Right hip straight time 4 hours Frequent neurovascular checks Repeat neurologic imaging with CT and/MRI at the discretion of neurology team Electronically Signed   By: Corrie Mckusick D.O.   On: 12/18/2020 21:30   MR CARDIAC MORPHOLOGY W WO CONTRAST  Result Date: 12/19/2020 CLINICAL DATA:  Evaluate for LV thrombus EXAM: CARDIAC MRI TECHNIQUE: The patient was scanned on a 1.5 Tesla Siemens magnet. A dedicated cardiac coil was used. Functional imaging was done using Fiesta sequences. 2,3, and 4 chamber views were done to assess for RWMA's. Modified Simpson's rule using a short axis stack was used to calculate an ejection fraction on a dedicated work Conservation officer, nature. The patient received 14 cc of Gadavist. After 10 minutes inversion recovery sequences were used to assess for infiltration and scar tissue. CONTRAST:  14 cc  of Gadavist FINDINGS: Left ventricle: -Normal  size -Low normal systolic function.  Akinesis of apex -LV apical thrombus measuring 38m x 670m-Mild nonspecific ECV elevation (30%) -Subendocardial LGE in mid to apical anteroseptum as well as part of the mid to apical anterior wall and the apical inferior wall and apex. LGE >50% transmural at apex, suggesting nonviability. Remainder of myocardium appears viable. LV EF: 51% (Normal 56-78%) Absolute volumes: LV EDV: 12326mNormal 77-195 mL) LV ESV: 48m32mormal 19-72 mL) LV SV: 63mL72mrmal 51-133 mL) CO: 4.3L/min (Normal 2.8-8.8 L/min) Indexed volumes: LV  EDV: 52mL/35m (Normal 47-92 mL/sq-m) LV ESV: 25mL/s23m(Normal 13-30 mL/sq-m) LV SV: 27mL/sq56mNormal 32-62 mL/sq-m) CI: 1.8L/min/sq-m (Normal 1.7-4.2 L/min/sq-m) Right ventricle: Normal size and systolic function RV EF:  59% (Normal 47-74%) Absolute volumes: RV EDV: 115mL (No41m 88-227 mL) RV ESV: 47mL (Nor54m23-103 mL) RV SV: 69mL (Norm86m2-138 mL) CO: 4.6L/min (Normal 2.8-8.8 L/min) Indexed volumes: RV EDV: 49mL/sq-m (59mal 55-105 mL/sq-m) RV ESV: 20mL/sq-m (N63ml 15-43 mL/sq-m) RV SV: 29mL/sq-m (No43m 32-64 mL/sq-m) CI: 2.0L/min/sq-m (Normal 1.7-4.2 L/min/sq-m) Left atrium: Normal size Right atrium: Normal size Mitral valve: Trivial regurgitation Aortic valve: No regurgitation Tricuspid valve: No regurgitation Pulmonic valve: No regurgitation Aorta: Normal proximal ascending aorta Coronary arteries: Normal origins Pericardium: Normal IMPRESSION: 1. LV apical thrombus measuring 11mm x 6mm 2. 85mndoc30mial late gadolinium enhancement consistent with prior infarct in part of the mid to apical anterior wall, mid anteroseptal wall, apical septal/inferior walls, and apex. LGE is less than 50% transmural in the mid anteroseptal/anterior walls and apical septal/anterior/inferior walls, suggesting viability. LGE is greater than 50% transmural at the apex, suggesting apex is not viable. 3. Normal LV size with low normal systolic function (EF 51%). Akinesis o54%pex. 4.  Normal RV size and systolic function (EF 59%) Electronica65% Signed   By: Christopher  SchOswaldo Milian9/2022 19:05   ECHOCARDIOGRAM COMPLETE  Result Date: 12/17/2020    ECHOCARDIOGRAM REPORT   Patient Name:   Karl Morgan/17/2022 Medical Rec #:  [id number]      [id number]    72.0 in Accession #:    [id number]     [id number]   245.1 lb Date of Birth:  1960-12-06      B26-Feb-1962    2.323 m Patient Age:    60 years       B21           122/72 mmHg Patient Gender: M              HR:           72 bpm. Exam Location:   Inpatient Procedure: 2D Echo, Cardiac Doppler and Color Doppler Indications:    Stroke 434.91 / I163.9  History:        Patient has prior history of Echocardiogram examinations, most                 recent 08/03/2019. Previous Myocardial Infarction, Stroke, Mitral                 Valve Disease; Risk Factors:Hypertension, Diabetes and                 Dyslipidemia. Substance abuse (HCC) (From Hx). [place]:    Johanna Elliott Leavy Cellar#2:  Bernard White RCAlvino Chapelhys: [phone] MCNEILL P [phone] IMPRESSIONS  1. Definity contrast utilized. There is a nonmobile LV mural thrombus (approximately 1.2 x 0.5 cm) in  region of akinesis involving the apical septal wall. Reported to covering team at 5:10 PM.  2. Left ventricular ejection fraction, by estimation, is 60 to 65%. The left ventricle has normal function. The left ventricle demonstrates regional wall motion abnormalities (see scoring diagram/findings for description). There is mild left ventricular  hypertrophy. Left ventricular diastolic parameters are indeterminate.  3. Right ventricular systolic function is normal. The right ventricular size is normal. Tricuspid regurgitation signal is inadequate for assessing PA pressure.  4. The mitral valve is grossly normal. Trivial mitral valve regurgitation.  5. The aortic valve is tricuspid. Aortic valve regurgitation is not visualized.  6. The inferior vena cava is normal in size with greater than 50% respiratory variability, suggesting right atrial pressure of 3 mmHg. Comparison(s): Prior images reviewed side by side. LV mural thrombus newly identified. Prior study from May 2021 did have similar apical septal wall motion abnormality. FINDINGS  Left Ventricle: Left ventricular ejection fraction, by estimation, is 60 to 65%. The left ventricle has normal function. The left ventricle demonstrates regional wall motion abnormalities. Definity contrast agent was given IV to delineate the left ventricular endocardial  borders. The left ventricular internal cavity size was normal in size. There is mild left ventricular hypertrophy. Left ventricular diastolic parameters are indeterminate.  LV Wall Scoring: The apical septal segment and apex are akinetic. The apical anterior segment is hypokinetic. The anterior wall, entire lateral wall, anterior septum, entire inferior wall, mid inferoseptal segment, and basal inferoseptal segment are normal. Right Ventricle: The right ventricular size is normal. No increase in right ventricular wall thickness. Right ventricular systolic function is normal. Tricuspid regurgitation signal is inadequate for assessing PA pressure. Left Atrium: Left atrial size was normal in size. Right Atrium: Right atrial size was normal in size. Pericardium: There is no evidence of pericardial effusion. Presence of pericardial fat pad. Mitral Valve: The mitral valve is grossly normal. Trivial mitral valve regurgitation. Tricuspid Valve: The tricuspid valve is grossly normal. Tricuspid valve regurgitation is trivial. Aortic Valve: The aortic valve is tricuspid. There is mild aortic valve annular calcification. Aortic valve regurgitation is not visualized. Pulmonic Valve: The pulmonic valve was not well visualized. Pulmonic valve regurgitation is not visualized. Aorta: The aortic root is normal in size and structure. Venous: The inferior vena cava is normal in size with greater than 50% respiratory variability, suggesting right atrial pressure of 3 mmHg. IAS/Shunts: No atrial level shunt detected by color flow Doppler.  LEFT VENTRICLE PLAX 2D LVIDd:         5.40 cm  Diastology LVIDs:         3.50 cm  LV e' medial:    6.85 cm/s LV PW:         1.10 cm  LV E/e' medial:  8.9 LV IVS:        1.10 cm  LV e' lateral:   7.83 cm/s LVOT diam:     2.00 cm  LV E/e' lateral: 7.8 LVOT Area:     3.14 cm  RIGHT VENTRICLE RV S prime:     15.90 cm/s TAPSE (M-mode): 2.4 cm LEFT ATRIUM             Index       RIGHT ATRIUM          Index  LA diam:        3.50 cm 1.51 cm/m  RA Area:     9.85 cm LA Vol (A2C):   48.8 ml 21.01 ml/m RA Volume:  21.30 ml 9.17 ml/m LA Vol (A4C):   18.1 ml 7.79 ml/m LA Biplane Vol: 30.1 ml 12.96 ml/m   AORTA Ao Root diam: 2.80 cm MITRAL VALVE MV Area (PHT): 3.08 cm    SHUNTS MV Decel Time: 246 msec    Systemic Diam: 2.00 cm MV E velocity: 60.80 cm/s MV A velocity: 78.60 cm/s MV E/A ratio:  0.77 Rozann Lesches MD Electronically signed by Rozann Lesches MD Signature Date/Time: 12/17/2020/5:19:12 PM    Final    CUP PACEART REMOTE DEVICE CHECK  Result Date: 12/14/2020 ILR summary report received. Battery status OK. Normal device function. No new symptom, tachy, brady, or pause episodes. No new AF episodes. Monthly summary reports and ROV/PRN  IR PERCUTANEOUS ART THROMBECTOMY/INFUSION INTRACRANIAL INC DIAG ANGIO  Result Date: 12/19/2020 INDICATION: 60 year old male with a history acute stroke right M1/right MCA syndrome, presents for angiogram and possible mechanical thrombectomy EXAM: ULTRASOUND-GUIDED ACCESS RIGHT COMMON FEMORAL ARTERY CERVICAL AND CEREBRAL ANGIOGRAM MECHANICAL THROMBECTOMY RIGHT MCA FLAT PANEL CT ANGIO-SEAL FOR HEMOSTASIS COMPARISON:  CT IMAGING SAME DAY MEDICATIONS: None ANESTHESIA/SEDATION: The anesthesia team was present to provide general endotracheal tube anesthesia and for patient monitoring during the procedure. Intubation was performed in room 2 neuro IR. Left radial arterial line was performed by the anesthesia team. Interventional neuro radiology nursing staff was also present. CONTRAST:  90 cc Omni 240 FLUOROSCOPY TIME:  Fluoroscopy Time: 19 minutes 6 seconds (1588 mGy). COMPLICATIONS: SIR LEVEL B - Normal therapy, includes overnight admission for observation. Subarachnoid hemorrhage TECHNIQUE: Informed written consent was obtained from the patient's family after a thorough discussion of the procedural risks, benefits and alternatives. Specific risks discussed include: Bleeding,  infection, contrast reaction, kidney injury/failure, need for further procedure/surgery, arterial injury or dissection, embolization to new territory, intracranial hemorrhage (10-15% risk), neurologic deterioration, cardiopulmonary collapse, death. All questions were addressed. Maximal Sterile Barrier Technique was utilized including during the procedure including caps, mask, sterile gowns, sterile gloves, sterile drape, hand hygiene and skin antiseptic. A timeout was performed prior to the initiation of the procedure. The anesthesia team was present to provide general endotracheal tube anesthesia and for patient monitoring during the procedure. Interventional neuro radiology nursing staff was also present. FINDINGS: Initial Findings: Right common carotid artery:  Normal course caliber and contour. Right external carotid artery: Patent with antegrade flow. Right internal carotid artery: Normal course caliber and contour of the cervical portion. Vertical and petrous segment patent with normal course caliber contour. Cavernous segment patent. Clinoid segment patent. Antegrade flow of the ophthalmic artery. Ophthalmic segment patent. Terminus patent. Right MCA: Occlusion was present at the branch point of superior and inferior. There had been migration of the M1 thrombus distally to the branch point, when compared to the baseline CT angiogram. The embolus had moved primarily into the inferior division, with flow maintained into the superior division on the initial angiogram. Right ACA: A 1 segment patent. Full perfusion of the right ACA territory with crossover flow into the left ACA territory via patent anterior communicating artery. Completion Findings: Right MCA: Restoration of flow through the right MCA, into the inferior and superior division branch point. The fragmented embolus entered the inferior division. The inferior division is the dominant division, with perfusion of the temporal lobe and the parietal lobe,  with the superior segment perfusing essentially the frontal region only. After treatment, there is restoration of the proximal flow, with slow flow within distal branches of the inferior division, affecting both parietal and temporal lobes, with TICI 2c flow:  Near complete perfusion except for slow flow in a few distal cortical vessels/presence of small distal cortical emboli Left: Left common carotid artery:  Normal course caliber and contour. Left external carotid artery: Patent with antegrade flow. Left internal carotid artery: Normal course caliber and contour of the cervical portion. Mild atherosclerotic plaque at the origin of the left ICA without significant stenosis. Vertical and petrous segment patent with normal course caliber contour. Cavernous segment patent. Clinoid segment patent. Antegrade flow of the ophthalmic artery. Ophthalmic segment patent. Terminus patent. Left MCA: M1 segment patent. Insular and opercular segments patent. Unremarkable caliber and course of the cortical segments. Typical arterial, capillary/ parenchymal, and venous phase. Left ACA: A 1 segment patent. A 2 segment perfuses the right ACA territory. Flat panel CT: Small volume subarachnoid hemorrhage within the right sulci, [place] IIIC PROCEDURE: The anesthesia team was present to provide general endotracheal tube anesthesia and for patient monitoring during the procedure. Intubation was performed in negative pressure [place] in neuro IR holding. Interventional neuro radiology nursing staff was also present. Ultrasound survey of the right inguinal region was performed with images stored and sent to PACs. 11 blade scalpel was used to make a small incision. Blunt dissection was performed with US guidance. A micropuncture needle was used access the right common femoral artery under ultrasound. With excellent arterial blood flow returned, an .018 micro wire was passed through the needle, observed to enter the abdominal aorta under  fluoroscopy. The needle was removed, and a micropuncture sheath was placed over the wire. The inner dilator and wire were removed, and an 035 wire was advanced under fluoroscopy into the abdominal aorta. The sheath was removed and a 25cm 50F straight vascular sheath was placed. The dilator was removed and the sheath was flushed. Sheath was attached to pressurized and heparinized saline bag for constant forward flow. A coaxial system was then advanced over the 035 wire. This included a 95cm 087 "Walrus" balloon guide with coaxial 125cm Berenstein diagnostic catheter. This was advanced to the proximal descending thoracic aorta. Wire was then removed. Double flush of the catheter was performed. Catheter was then used to select the innominate artery. Angiogram was performed. Using roadmap technique, the catheter was advanced over a standard glide wire into right cervical ICA, with distal position achieved of the balloon guide. The diagnostic catheter and the wire were removed. Formal angiogram was performed. Road map function was used once the occluded vessel was identified. Copious back flush was performed and the balloon catheter was attached to heparinized and pressurized saline bag for forward flow. A second coaxial system was then advanced through the balloon catheter, which included the selected intermediate catheter, microcatheter, and microwire. In this scenario, the set up included a zoom 71 aspiration catheter, a Trevo Provue18 microcatheter, and 014 synchro soft wire. This system was advanced through the balloon guide catheter under the road-map function, with adequate back-flush at the rotating hemostatic valve at that back end of the balloon guide. Once the wire and microcatheter were into the laceral segment, we attach heparinized saline pressured flush to the guide. Microcatheter and the intermediate catheter system were advanced through the terminal ICA and MCA gently to the proximal M1 segment, and were  advanced up to but not through the occluded segment. The zoom catheter was then advanced over the microwire into the proximal M1 segment. The microcatheter microwire were then removed. The proprietary engine was attached to the hub of the aspiration catheter confirming return of flow. Catheter was then  gently advanced into the embolus/thrombus at the site of occlusion. Approximately 1 minute of time was observed and then the catheter was withdrawn. Catheter was withdrawn into the balloon guide with free aspiration performed at the hub of the aspiration catheter. Angiogram was performed, which confirmed restoration of flow through the proximal inferior segment, with distal migration of the thrombus into a distal branch. We elected to attempt a second pass. 2nd pass: Coaxial system was advanced through the balloon guide, which included a zoom 71 aspiration catheter, a Trevo Provue18 microcatheter, and 014 synchro soft wire. The system was advanced through the balloon guide catheter under the road-map function, with adequate back-flush at the rotating hemostatic valve at that back end of the balloon guide. Microcatheter and the intermediate catheter system were advanced through the terminal ICA and MCA to the level of the bifurcation of superior/inferior division. The micro wire was then carefully advanced into the inferior segment and through the occluded segment. Microcatheter was then manipulated through the occluded segment and the wire was removed with saline drip at the hub. Blood was then aspirated through the hub of the microcatheter, and a gentle contrast injection was performed confirming intraluminal position within the inferior division. A rotating hemostatic valve was then attached to the back end of the microcatheter, and a pressurized and heparinized saline bag was attached to the catheter. 4 x 40 solitaire device was then selected. Back flush was achieved at the rotating hemostatic valve, and then the  device was gently advanced through the microcatheter to the distal end. The retriever was then unsheathed by withdrawing the microcatheter under fluoroscopy. Once the retriever was completely unsheathed, the microcatheter was carefully stripped from the delivery device. Control angiogram was performed from the intermediate catheter. A 3 minute time interval was observed. Constant aspiration using the proprietary engine was then performed at the intermediate catheter, as the retriever was gently and slowly withdrawn with fluoroscopic observation. Once the retriever was "corked" within the tip of the intermediate catheter, both were removed from the system. Free aspiration was confirmed at the hub of the balloon guide catheter, with free blood return confirmed. The balloon was then deflated, and a control angiogram was performed. Improved flow, with again distal migration within the inferior division. We elected to attempt a third pass. 3rd pass: The 014 synchro soft wire was loaded through a zoom 35 aspiration catheter. Roadmap angiogram was performed. The aspiration catheter and the microwire were then advanced to the inferior segment, just proximal to the occlusion site. Once the aspiration catheter was just proximal to the occlusion site the microwire was removed. The rotating hemostatic valve was then removed from the hub, and the proprietary engine was attached directly to the hub of the zoom 35 catheter the pump was turned on confirming backflow and the catheter was advanced until cessation of flow was observed. Catheter was advanced through the site of the occlusion and then withdrawn. Upon withdrawal, aspiration was performed at the hub of the balloon guide catheter. Once the aspiration catheter was completely removed, aspiration was performed at the hub of the balloon guide confirming free return of blood. Angiogram was performed. There was improved flow through the site of the occlusion, however, again  distal migration was observed within the affected division, with again distal migration within the inferior division. We elected 1 final pass into the inferior division M3/M4 with the microcatheter. Fourth pass: A roadmap angiogram was performed. Through the balloon guide, a 160 cm Trevo Trak microcatheter was  advanced with the synchro soft, into the distal aspect of the inferior division, targeting the residual, most proximal occlusive embolus of the inferior division branches. Once the microcatheter was distal to the site of the occlusion, the microwire was removed. Catheter aspiration was performed as the catheter was then withdrawn. Catheter was completely withdrawn from the system. Angiogram performed demonstrated persistent occlusion in the distal M3/M4 cortical branch of the inferior division. At this time we elected to terminate the case. Balloon guide was withdrawn. A JB 1 catheter was used to select the origin of the left CCA. Angiogram was performed. JB 1 catheter was then removed. The skin at the puncture site was then cleaned with Chlorhexidine. The 8 French sheath was removed and an 59F angioseal was deployed. Flat panel CT was performed. Patient was extubated once the CT was reviewed. Patient tolerated the procedure well and remained hemodynamically stable throughout. No complications were encountered and no significant blood loss encountered. IMPRESSION: Status post ultrasound guided access right common femoral artery for mechanical thrombectomy of right-sided MCA ELVO, with 4 passes achieving TICI 2c flow at the completion. Angiogram of the left ICA demonstrates no occlusive branches intracranial. Angio-Seal restored for hemostasis. Signed, Dulcy Fanny. Dellia Nims, RPVI [facility] Radiology PLAN: Patient extubated ICU status Target systolic blood pressure of 120-140 Right hip straight time 4 hours Frequent neurovascular checks Repeat neurologic imaging  with CT and/MRI at the discretion of neurology team Electronically Signed   By: Corrie Mckusick D.O.   On: 12/18/2020 21:30   CT HEAD CODE STROKE WO CONTRAST  Result Date: 12/17/2020 CLINICAL DATA:  Code stroke.  Left-sided weakness EXAM: CT HEAD WITHOUT CONTRAST TECHNIQUE: Contiguous axial images were obtained from the base of the skull through the vertex without intravenous contrast. COMPARISON:  08/02/2019 FINDINGS: Brain: Small chronic high right frontal cortex infarct. No convincing acute infarct. No acute hemorrhage. Remote bilateral cerebellar infarcts. No hydrocephalus or mass. Vascular: Hyperdense right M1 segment Skull: Negative Sinuses/Orbits: Negative Other: Critical Value/emergent results were called by telephone at the time of interpretation on 12/17/2020 at 5:24 am to provider [facility] , who verbally acknowledged these results. ASPECTS [facility] Stroke Program Early CT Score) - Ganglionic level infarction (caudate, lentiform nuclei, internal capsule, insula, M1-M3 cortex): 7 - Supraganglionic infarction (M4-M6 cortex): 3 when accounting for chronic cortex infarct Total score (0-10 with 10 being normal): 10 IMPRESSION: 1. Hyperdense right M1 segment, reference CTA. 2. No acute hemorrhage or visible acute infarct. 3. Remote right frontal cortex and bilateral cerebellar infarcts. Electronically Signed   By: Jorje Guild M.D.   On: 12/17/2020 05:25   IR ANGIO INTRA EXTRACRAN SEL COM CAROTID INNOMINATE UNI L MOD SED  Result Date: 12/19/2020 INDICATION: 60 year old male with a history acute stroke right M1/right MCA syndrome, presents for angiogram and possible mechanical thrombectomy EXAM: ULTRASOUND-GUIDED ACCESS RIGHT COMMON FEMORAL ARTERY CERVICAL AND CEREBRAL ANGIOGRAM MECHANICAL THROMBECTOMY RIGHT MCA FLAT PANEL CT ANGIO-SEAL FOR HEMOSTASIS COMPARISON:  CT IMAGING SAME DAY MEDICATIONS: None ANESTHESIA/SEDATION: The anesthesia team was present to provide general endotracheal tube anesthesia  and for patient monitoring during the procedure. Intubation was performed in room 2 neuro IR. Left radial arterial line was performed by the anesthesia team. Interventional neuro radiology nursing staff was also present. CONTRAST:  90 cc Omni 240 FLUOROSCOPY TIME:  Fluoroscopy Time: 19 minutes 6 seconds (1588 mGy). COMPLICATIONS: SIR LEVEL B - Normal therapy, includes overnight admission for observation. Subarachnoid hemorrhage TECHNIQUE: Informed written consent was obtained  from the patient's family after a thorough discussion of the procedural risks, benefits and alternatives. Specific risks discussed include: Bleeding, infection, contrast reaction, kidney injury/failure, need for further procedure/surgery, arterial injury or dissection, embolization to new territory, intracranial hemorrhage (10-15% risk), neurologic deterioration, cardiopulmonary collapse, death. All questions were addressed. Maximal Sterile Barrier Technique was utilized including during the procedure including caps, mask, sterile gowns, sterile gloves, sterile drape, hand hygiene and skin antiseptic. A timeout was performed prior to the initiation of the procedure. The anesthesia team was present to provide general endotracheal tube anesthesia and for patient monitoring during the procedure. Interventional neuro radiology nursing staff was also present. FINDINGS: Initial Findings: Right common carotid artery:  Normal course caliber and contour. Right external carotid artery: Patent with antegrade flow. Right internal carotid artery: Normal course caliber and contour of the cervical portion. Vertical and petrous segment patent with normal course caliber contour. Cavernous segment patent. Clinoid segment patent. Antegrade flow of the ophthalmic artery. Ophthalmic segment patent. Terminus patent. Right MCA: Occlusion was present at the branch point of superior and inferior. There had been migration of the M1 thrombus distally to the branch point,  when compared to the baseline CT angiogram. The embolus had moved primarily into the inferior division, with flow maintained into the superior division on the initial angiogram. Right ACA: A 1 segment patent. Full perfusion of the right ACA territory with crossover flow into the left ACA territory via patent anterior communicating artery. Completion Findings: Right MCA: Restoration of flow through the right MCA, into the inferior and superior division branch point. The fragmented embolus entered the inferior division. The inferior division is the dominant division, with perfusion of the temporal lobe and the parietal lobe, with the superior segment perfusing essentially the frontal region only. After treatment, there is restoration of the proximal flow, with slow flow within distal branches of the inferior division, affecting both parietal and temporal lobes, with TICI 2c flow: Near complete perfusion except for slow flow in a few distal cortical vessels/presence of small distal cortical emboli Left: Left common carotid artery:  Normal course caliber and contour. Left external carotid artery: Patent with antegrade flow. Left internal carotid artery: Normal course caliber and contour of the cervical portion. Mild atherosclerotic plaque at the origin of the left ICA without significant stenosis. Vertical and petrous segment patent with normal course caliber contour. Cavernous segment patent. Clinoid segment patent. Antegrade flow of the ophthalmic artery. Ophthalmic segment patent. Terminus patent. Left MCA: M1 segment patent. Insular and opercular segments patent. Unremarkable caliber and course of the cortical segments. Typical arterial, capillary/ parenchymal, and venous phase. Left ACA: A 1 segment patent. A 2 segment perfuses the right ACA territory. Flat panel CT: Small volume subarachnoid hemorrhage within the right sulci, [place] IIIC PROCEDURE: The anesthesia team was present to provide general  endotracheal tube anesthesia and for patient monitoring during the procedure. Intubation was performed in negative pressure [place] in neuro IR holding. Interventional neuro radiology nursing staff was also present. Ultrasound survey of the right inguinal region was performed with images stored and sent to PACs. 11 blade scalpel was used to make a small incision. Blunt dissection was performed with US guidance. A micropuncture needle was used access the right common femoral artery under ultrasound. With excellent arterial blood flow returned, an .018 micro wire was passed through the needle, observed to enter the abdominal aorta under fluoroscopy. The needle was removed, and a micropuncture sheath was placed over the wire. The inner dilator  and wire were removed, and an 035 wire was advanced under fluoroscopy into the abdominal aorta. The sheath was removed and a 25cm 33F straight vascular sheath was placed. The dilator was removed and the sheath was flushed. Sheath was attached to pressurized and heparinized saline bag for constant forward flow. A coaxial system was then advanced over the 035 wire. This included a 95cm 087 "Walrus" balloon guide with coaxial 125cm Berenstein diagnostic catheter. This was advanced to the proximal descending thoracic aorta. Wire was then removed. Double flush of the catheter was performed. Catheter was then used to select the innominate artery. Angiogram was performed. Using roadmap technique, the catheter was advanced over a standard glide wire into right cervical ICA, with distal position achieved of the balloon guide. The diagnostic catheter and the wire were removed. Formal angiogram was performed. Road map function was used once the occluded vessel was identified. Copious back flush was performed and the balloon catheter was attached to heparinized and pressurized saline bag for forward flow. A second coaxial system was then advanced through the balloon catheter, which included the  selected intermediate catheter, microcatheter, and microwire. In this scenario, the set up included a zoom 71 aspiration catheter, a Trevo Provue18 microcatheter, and 014 synchro soft wire. This system was advanced through the balloon guide catheter under the road-map function, with adequate back-flush at the rotating hemostatic valve at that back end of the balloon guide. Once the wire and microcatheter were into the laceral segment, we attach heparinized saline pressured flush to the guide. Microcatheter and the intermediate catheter system were advanced through the terminal ICA and MCA gently to the proximal M1 segment, and were advanced up to but not through the occluded segment. The zoom catheter was then advanced over the microwire into the proximal M1 segment. The microcatheter microwire were then removed. The proprietary engine was attached to the hub of the aspiration catheter confirming return of flow. Catheter was then gently advanced into the embolus/thrombus at the site of occlusion. Approximately 1 minute of time was observed and then the catheter was withdrawn. Catheter was withdrawn into the balloon guide with free aspiration performed at the hub of the aspiration catheter. Angiogram was performed, which confirmed restoration of flow through the proximal inferior segment, with distal migration of the thrombus into a distal branch. We elected to attempt a second pass. 2nd pass: Coaxial system was advanced through the balloon guide, which included a zoom 71 aspiration catheter, a Trevo Provue18 microcatheter, and 014 synchro soft wire. The system was advanced through the balloon guide catheter under the road-map function, with adequate back-flush at the rotating hemostatic valve at that back end of the balloon guide. Microcatheter and the intermediate catheter system were advanced through the terminal ICA and MCA to the level of the bifurcation of superior/inferior division. The micro wire was then  carefully advanced into the inferior segment and through the occluded segment. Microcatheter was then manipulated through the occluded segment and the wire was removed with saline drip at the hub. Blood was then aspirated through the hub of the microcatheter, and a gentle contrast injection was performed confirming intraluminal position within the inferior division. A rotating hemostatic valve was then attached to the back end of the microcatheter, and a pressurized and heparinized saline bag was attached to the catheter. 4 x 40 solitaire device was then selected. Back flush was achieved at the rotating hemostatic valve, and then the device was gently advanced through the microcatheter to the distal end.  The retriever was then unsheathed by withdrawing the microcatheter under fluoroscopy. Once the retriever was completely unsheathed, the microcatheter was carefully stripped from the delivery device. Control angiogram was performed from the intermediate catheter. A 3 minute time interval was observed. Constant aspiration using the proprietary engine was then performed at the intermediate catheter, as the retriever was gently and slowly withdrawn with fluoroscopic observation. Once the retriever was "corked" within the tip of the intermediate catheter, both were removed from the system. Free aspiration was confirmed at the hub of the balloon guide catheter, with free blood return confirmed. The balloon was then deflated, and a control angiogram was performed. Improved flow, with again distal migration within the inferior division. We elected to attempt a third pass. 3rd pass: The 014 synchro soft wire was loaded through a zoom 35 aspiration catheter. Roadmap angiogram was performed. The aspiration catheter and the microwire were then advanced to the inferior segment, just proximal to the occlusion site. Once the aspiration catheter was just proximal to the occlusion site the microwire was removed. The rotating  hemostatic valve was then removed from the hub, and the proprietary engine was attached directly to the hub of the zoom 35 catheter the pump was turned on confirming backflow and the catheter was advanced until cessation of flow was observed. Catheter was advanced through the site of the occlusion and then withdrawn. Upon withdrawal, aspiration was performed at the hub of the balloon guide catheter. Once the aspiration catheter was completely removed, aspiration was performed at the hub of the balloon guide confirming free return of blood. Angiogram was performed. There was improved flow through the site of the occlusion, however, again distal migration was observed within the affected division, with again distal migration within the inferior division. We elected 1 final pass into the inferior division M3/M4 with the microcatheter. Fourth pass: A roadmap angiogram was performed. Through the balloon guide, a 160 cm Trevo Trak microcatheter was advanced with the synchro soft, into the distal aspect of the inferior division, targeting the residual, most proximal occlusive embolus of the inferior division branches. Once the microcatheter was distal to the site of the occlusion, the microwire was removed. Catheter aspiration was performed as the catheter was then withdrawn. Catheter was completely withdrawn from the system. Angiogram performed demonstrated persistent occlusion in the distal M3/M4 cortical branch of the inferior division. At this time we elected to terminate the case. Balloon guide was withdrawn. A JB 1 catheter was used to select the origin of the left CCA. Angiogram was performed. JB 1 catheter was then removed. The skin at the puncture site was then cleaned with Chlorhexidine. The 8 French sheath was removed and an 93F angioseal was deployed. Flat panel CT was performed. Patient was extubated once the CT was reviewed. Patient tolerated the procedure well and remained hemodynamically stable throughout.  No complications were encountered and no significant blood loss encountered. IMPRESSION: Status post ultrasound guided access right common femoral artery for mechanical thrombectomy of right-sided MCA ELVO, with 4 passes achieving TICI 2c flow at the completion. Angiogram of the left ICA demonstrates no occlusive branches intracranial. Angio-Seal restored for hemostasis. Signed, Dulcy Fanny. Dellia Nims, RPVI [facility] Radiology PLAN: Patient extubated ICU status Target systolic blood pressure of 120-140 Right hip straight time 4 hours Frequent neurovascular checks Repeat neurologic imaging with CT and/MRI at the discretion of neurology team Electronically Signed   By: Corrie Mckusick D.O.   On: 12/18/2020 21:30  HISTORY OF PRESENT ILLNESS Mr. Glynn F [place] is a 60 y.o. left-handed male with history of stroke, diabetes, hypertension, hyperlipidemia, CAD/MI, CKD admitted for left-sided weakness and aphasia. No tPA given due to outside window.   HOSPITAL COURSE Stroke:  right MCA infarct due to right M1 occlusion status post IR with TICI2c revascularization, embolic pattern, secondary to LV thrombus Resultant expressive aphasia CT head no acute abnormality, old right frontal and bilateral cerebellar infarcts.  Hyperdense right MCA sign. CTA head neck right M1 occlusion S/p IR with TICI2c revascularization of right M1 MRI right MCA moderately large infarct with petechial hemorrhage MRA right M1 patent 2D Echo nonmobile LV mural thrombus (approximately 1.2 x 0.5 cm) in region of akinesis involving the apical septal wall.  LDL 49 HgbA1c 6.7 Heparin IV for VTE prophylaxis aspirin 81 mg daily and clopidogrel 75 mg daily prior to admission, was on lovenox due to LV thrombus to bridge to coumadin Patient counseled to be compliant with his antithrombotic medications Ongoing aggressive stroke risk factor management Therapy recommendations: outpatient OT, no  PT f/u Disposition: home with outpatient PT   LV thrombus TTE showed nonmobile LV mural thrombus (approximately 1.2 x 0.5 cm) in region of akinesis involving the apical septal wall.  Discussed with cardiology on-call Dr. Juliane Lack, recommend cardiac MRI to confirm LV thrombus Cardiac MRI confirmed LV apical thrombus Continue heparin IV Loop recorder so far no A. fib 08/2019 TEE showed EF 50 to 55%, no regional wall motion abnormalities, no PFO Cardiology consult requested Hx of CAD s/p stents   History of stroke 02/2015, left arm weakness, difficulty walking, MRI showed right motor strip small infarct.  EF 60 to 65%, MRA head and neck right VA occlusion.  A1c 7.2, LDL 57, discharged with DAPT 08/2019 admitted for right superior cerebellum infarct with hemorrhagic transformation.  CT head and neck right VA chronic occlusion.  EF 65 to 70%.  TEE showed EF 50 to 55%, no regional wall motion abnormalities, no PFO.  Loop recorder placed.  LDL 75, A1c 8.7, discharged with DAPT and Lipitor. Followed with Dr. Leonie Man and Janett Billow NP at [facility], loop recorder negative for A. fib.   Diabetes HgbA1c 6.7, goal < 7.0 controlled CBG monitoring SSI On heart healthy and carb diet DM education and close PCP follow up   Hypertension Stable Long term BP goal normotensive   Hyperlipidemia Home meds: Lipitor 80 and Zetia 10 LDL 49, goal < 70 Now on Lipitor 80 and Zetia 10 Continue statin at discharge   Other Stroke Risk Factors Obesity, BMI 33.25 Coronary artery disease/MI s/p stenting   Other Active Problems CKD IIIA, creatinine 1.34->1.34->1.28        DISCHARGE EXAM Blood pressure 110/72, pulse 71, temperature 97.8 F (36.6 C), temperature source Oral, resp. rate 17, SpO2 94 %. General - Well nourished, well developed, in no apparent distress.   Ophthalmologic - fundi not visualized due to noncooperation.   Cardiovascular - Regular rhythm and rate.   Neuro - awake, alert, eyes open,  orientated to age and people. Expressive aphasia improving and able to say short sentences but still has paraphasic errors.  following all simple commands. No gaze palsy, tracking bilaterally, visual field full, PERRL. Mild right facial droop. Tongue midline. Bilateral UEs 5/5, no drift. Bilaterally LEs 5/5, no drift. Sensation symmetrical bilaterally, b/l FTN intact, gait not tested.   Discharge Diet       Diet   Diet heart healthy/carb modified Room service appropriate? Yes; Fluid consistency:  Thin   liquids  DISCHARGE PLAN Disposition:  home warfarin daily and lovenox due to LV thrombus. Please continue on Lovenox for 6 more days or until therapeutic INR level is reached.  Ongoing stroke risk factor control by Primary Care Physician at time of discharge Follow-up PCP McLean-Scocuzza, Nino Glow, MD in 1-2 weeks. Follow-up in [facility] in 4 weeks, office to schedule an appointment.  Follow-up Thursday 12/22/20 at [place] at [facility] [facility] lab for INR check.   90 minutes were spent preparing discharge.  Laurey Morale, MSN, NP-C [facility] [phone]  ATTENDING NOTE: I reviewed above note and agree with the assessment and plan. Pt was seen and examined.   Wife at bedside.  Patient sitting in chair, ready to be discharged.  His speech continues to improve, currently on Lovenox bridge to Coumadin.  Has set up his cardiologist follow-up in [place] clinic in 2 days.  INR goal 2-3.  Continue outpatient speech therapy.  We will follow-up in [facility] in 4 weeks.  For detailed assessment and plan, please refer to above as I have made changes wherever appropriate.   Rosalin Hawking, MD PhD Stroke Neurology 12/20/2020 8:39 PM

## 2020-12-20 NOTE — TOC Benefit Eligibility Note (Signed)
Patient Teacher, English as a foreign language completed.    The patient is currently admitted and upon discharge could be taking Enoxaparin (Lovenox) 120 mg/0.8 ml.  The current 30 day co-pay is, $250.00.   The patient is insured through [place], [place] Patient Advocate Specialist [place] Team Direct Number: [phone]  Fax: [phone]

## 2020-12-20 NOTE — Plan of Care (Signed)
  Problem: Education: Goal: Knowledge of disease or condition will improve Outcome: Progressing Goal: Knowledge of secondary prevention will improve Outcome: Progressing Goal: Knowledge of patient specific risk factors addressed and post discharge goals established will improve Outcome: Progressing Goal: Individualized Educational Video(s) Outcome: Progressing   Problem: Coping: Goal: Will verbalize positive feelings about self Outcome: Progressing   Problem: Health Behavior/Discharge Planning: Goal: Ability to manage health-related needs will improve Outcome: Progressing   Problem: Nutrition: Goal: Risk of aspiration will decrease Outcome: Progressing   Problem: Ischemic Stroke/TIA Tissue Perfusion: Goal: Complications of ischemic stroke/TIA will be minimized Outcome: Progressing

## 2020-12-20 NOTE — Consult Note (Signed)
   [facility] Inpatient Consult   12/20/2020  Rohail F [place] [birth year] [id number]   [employer] [ACO] Patient: [employer] plan  Patient is assigned to [place] Management for follow up with a [place] Coordinator for the [place] for disease management and community resource support.      Plan: Patient will be followed by [place] Coordinator.   For additional questions or referrals please contact:   Natividad Brood, RN BSN [facility] Liaison  [phone] business mobile phone Toll free office [phone]  Fax number: [phone] [email or web address]

## 2020-12-20 NOTE — Progress Notes (Signed)
STROKE TEAM PROGRESS NOTE   SUBJECTIVE (INTERVAL HISTORY) Patient wife at bedside. Patient speech is improving. Plan to discharge on coumadin and lovenox until therapeutic INR reached. Patient does get frustrated with  OBJECTIVE Temp:  [97.8 F (36.6 C)-98.7 F (37.1 C)] 97.8 F (36.6 C) (09/20 0758) Pulse Rate:  [62-85] 71 (09/20 0758) Cardiac Rhythm: Normal sinus rhythm (09/20 0701) Resp:  [12-19] 17 (09/20 0758) BP: (108-134)/(58-78) 110/72 (09/20 0758) SpO2:  [94 %-100 %] 94 % (09/20 0758)  Recent Labs  Lab 12/19/20 0805 12/19/20 1206 12/19/20 1603 12/19/20 2120 12/20/20 0637  GLUCAP 149* 231* 157* 218* 145*   Recent Labs  Lab 12/17/20 0506 12/18/20 0322 12/19/20 0623 12/20/20 0337  NA 138 140 139 135  K 3.9 4.2 3.8 3.7  CL 107 106 108 101  CO2 24 21* 23 24  GLUCOSE 188* 101* 138* 129*  BUN 19 18 20 19   CREATININE 1.34* 1.34* 1.28* 1.25*  CALCIUM 9.3 9.1 9.0 9.1   Recent Labs  Lab 12/17/20 0506  AST 25  ALT 24  ALKPHOS 50  BILITOT 0.5  PROT 6.4*  ALBUMIN 3.9   Recent Labs  Lab 12/17/20 0506 12/18/20 0322 12/19/20 0623 12/20/20 0337  WBC 10.7* 13.6* 10.7* 9.3  NEUTROABS 6.6  --   --   --   HGB 14.6 14.7 13.4 12.7*  HCT 42.6 44.1 39.4 36.9*  MCV 93.8 97.8 95.9 94.4  PLT 185 175 145* 135*   No results for input(s): CKTOTAL, CKMB, CKMBINDEX, TROPONINI in the last 168 hours. Recent Labs    12/20/20 0337  LABPROT 14.7  INR 1.2   No results for input(s): COLORURINE, LABSPEC, PHURINE, GLUCOSEU, HGBUR, BILIRUBINUR, KETONESUR, PROTEINUR, UROBILINOGEN, NITRITE, LEUKOCYTESUR in the last 72 hours.  Invalid input(s): APPERANCEUR     Component Value Date/Time   CHOL 91 12/18/2020 0322   CHOL 109 09/22/2013 0908   TRIG 74 12/18/2020 0322   TRIG 99 09/22/2013 0908   HDL 27 (L) 12/18/2020 0322   HDL 25 (L) 09/22/2013 0908   CHOLHDL 3.4 12/18/2020 0322   VLDL 15 12/18/2020 0322   VLDL 20 09/22/2013 0908   LDLCALC 49 12/18/2020 0322   LDLCALC 71  08/22/2017 1005   LDLCALC 64 09/22/2013 0908   Lab Results  Component Value Date   HGBA1C 6.7 (H) 12/18/2020      Component Value Date/Time   LABOPIA NONE DETECTED 07/31/2019 2330   COCAINSCRNUR NONE DETECTED 07/31/2019 2330   LABBENZ NONE DETECTED 07/31/2019 2330   AMPHETMU NONE DETECTED 07/31/2019 2330   THCU NONE DETECTED 07/31/2019 2330   LABBARB NONE DETECTED 07/31/2019 2330    No results for input(s): ETH in the last 168 hours.  I have personally reviewed the radiological images below and agree with the radiology interpretations.  CT Angio Head W or Wo Contrast  Result Date: 12/17/2020 CLINICAL DATA:  Left-sided weakness EXAM: CT ANGIOGRAPHY HEAD AND NECK TECHNIQUE: Multidetector CT imaging of the head and neck was performed using the standard protocol during bolus administration of intravenous contrast. Multiplanar CT image reconstructions and MIPs were obtained to evaluate the vascular anatomy. Carotid stenosis measurements (when applicable) are obtained utilizing NASCET criteria, using the distal internal carotid diameter as the denominator. CONTRAST:  73mL OMNIPAQUE IOHEXOL 350 MG/ML SOLN COMPARISON:  07/31/2019 FINDINGS: CTA NECK FINDINGS Aortic arch: Standard branching. Imaged portion shows no evidence of aneurysm or dissection. No significant stenosis of the major arch vessel origins. Right carotid system: Mixed density plaque at the bifurcation  without flow limiting stenosis or ulceration. Left carotid system: Mixed density plaque at the bifurcation without flow limiting stenosis or ulceration. Vertebral arteries: No proximal subclavian stenosis. Strong left vertebral artery dominance. Skeleton: Degenerative endplate and facet spurring. Other neck: Negative Upper chest: Coronary calcification. Review of the MIP images confirms the above findings CTA HEAD FINDINGS Anterior circulation: Atheromatous calcification affecting the carotid siphons. Abrupt cut off at the distal right M1  segment with some downstream branch reconstitution. Negative for aneurysm Posterior circulation: Basilar is fed solely by the left vertebral artery. Fetal type bilateral PCA flow. No branch occlusion, beading, or aneurysm. Venous sinuses: Unremarkable for the arterial phase Anatomic variants: As above Review of the MIP images confirms the above findings Critical Value/emergent results were called by telephone at the time of interpretation on 12/17/2020 at 5:24 am to provider [facility] , who verbally acknowledged these results. IMPRESSION: 1. Emergent large vessel occlusion at the right M1 segment. 2. Nonobstructive atherosclerosis of the cervical and intracranial carotids. Electronically Signed   By: Jorje Guild M.D.   On: 12/17/2020 05:30   CT Angio Neck W and/or Wo Contrast  Result Date: 12/17/2020 CLINICAL DATA:  Left-sided weakness EXAM: CT ANGIOGRAPHY HEAD AND NECK TECHNIQUE: Multidetector CT imaging of the head and neck was performed using the standard protocol during bolus administration of intravenous contrast. Multiplanar CT image reconstructions and MIPs were obtained to evaluate the vascular anatomy. Carotid stenosis measurements (when applicable) are obtained utilizing NASCET criteria, using the distal internal carotid diameter as the denominator. CONTRAST:  81mL OMNIPAQUE IOHEXOL 350 MG/ML SOLN COMPARISON:  07/31/2019 FINDINGS: CTA NECK FINDINGS Aortic arch: Standard branching. Imaged portion shows no evidence of aneurysm or dissection. No significant stenosis of the major arch vessel origins. Right carotid system: Mixed density plaque at the bifurcation without flow limiting stenosis or ulceration. Left carotid system: Mixed density plaque at the bifurcation without flow limiting stenosis or ulceration. Vertebral arteries: No proximal subclavian stenosis. Strong left vertebral artery dominance. Skeleton: Degenerative endplate and facet spurring. Other neck: Negative Upper chest: Coronary  calcification. Review of the MIP images confirms the above findings CTA HEAD FINDINGS Anterior circulation: Atheromatous calcification affecting the carotid siphons. Abrupt cut off at the distal right M1 segment with some downstream branch reconstitution. Negative for aneurysm Posterior circulation: Basilar is fed solely by the left vertebral artery. Fetal type bilateral PCA flow. No branch occlusion, beading, or aneurysm. Venous sinuses: Unremarkable for the arterial phase Anatomic variants: As above Review of the MIP images confirms the above findings Critical Value/emergent results were called by telephone at the time of interpretation on 12/17/2020 at 5:24 am to provider [facility] , who verbally acknowledged these results. IMPRESSION: 1. Emergent large vessel occlusion at the right M1 segment. 2. Nonobstructive atherosclerosis of the cervical and intracranial carotids. Electronically Signed   By: Jorje Guild M.D.   On: 12/17/2020 05:30   MR ANGIO HEAD WO CONTRAST  Result Date: 12/17/2020 CLINICAL DATA:  Stroke follow-up. Status post thrombectomy for treatment of M1 occlusion. EXAM: MRI HEAD WITHOUT CONTRAST MRA HEAD WITHOUT CONTRAST TECHNIQUE: Multiplanar, multi-echo pulse sequences of the brain and surrounding structures were acquired without intravenous contrast. Angiographic images of the Circle of Willis were acquired using MRA technique without intravenous contrast. COMPARISON:  Head CT and CTA 12/17/2020.  Head MRI 08/01/2019. FINDINGS: MRI HEAD FINDINGS Brain: There is a small to moderate-sized acute right MCA infarct involving portions of the temporal, frontal, and parietal lobes and insula with associated petechial hemorrhage.  A small chronic right frontal cortical infarct is again noted, and there are chronic bilateral cerebellar infarcts with associated chronic blood products. The ventricles are normal in size. No mass, midline shift, or extra-axial fluid collection is identified. Vascular:  More fully evaluated below. Skull and upper cervical spine: Unremarkable bone marrow signal. Sinuses/Orbits: Unremarkable orbits. Mild mucosal thickening in the paranasal sinuses. Small right and trace left mastoid effusions. Other: None. MRA HEAD FINDINGS Anterior circulation: The internal carotid arteries are widely patent from skull base to carotid termini. ACAs and MCAs are patent without evidence of a proximal branch occlusion or significant proximal stenosis. Specifically, the recanalized right M1 segment remains patent without an underlying stenosis. No aneurysm is identified. Posterior circulation: The included intracranial portion of the left vertebral artery is widely patent and supplies the basilar. The right vertebral artery is not visualized and was shown to be hypoplastic on the earlier CTA. The basilar artery is widely patent. There is a large right posterior communicating artery. 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 No significant proximal PCA stenosis is evident. No aneurysm is identified. Anatomic variants: As above. IMPRESSION: 1. Small to moderate-sized acute right MCA infarct. 2. Chronic right frontal and bilateral cerebellar infarcts. 3. Negative head MRA. Electronically Signed   By: Logan Bores M.D.   On: 12/17/2020 14:55   MR BRAIN WO CONTRAST  Result Date: 12/17/2020 CLINICAL DATA:  Stroke follow-up. Status post thrombectomy for treatment of M1 occlusion. EXAM: MRI HEAD WITHOUT CONTRAST MRA HEAD WITHOUT CONTRAST TECHNIQUE: Multiplanar, multi-echo pulse sequences of the brain and surrounding structures were acquired without intravenous contrast. Angiographic images of the Circle of Willis were acquired using MRA technique without intravenous contrast. COMPARISON:  Head CT and CTA 12/17/2020.  Head MRI 08/01/2019. FINDINGS: MRI HEAD FINDINGS  Brain: There is a small to moderate-sized acute right MCA infarct involving portions of the temporal, frontal, and parietal lobes and insula with associated petechial hemorrhage. A small chronic right frontal cortical infarct is again noted, and there are chronic bilateral cerebellar infarcts with associated chronic blood products. The ventricles are normal in size. No mass, midline shift, or extra-axial fluid collection is identified. Vascular: More fully evaluated below. Skull and upper cervical spine: Unremarkable bone marrow signal. Sinuses/Orbits: Unremarkable orbits. Mild mucosal thickening in the paranasal sinuses. Small right and trace left mastoid effusions. Other: None. MRA HEAD FINDINGS Anterior circulation: The internal carotid arteries are widely patent from skull base to carotid termini. ACAs and MCAs are patent without evidence of a proximal branch occlusion or significant proximal stenosis. Specifically, the recanalized right M1 segment remains patent without an underlying stenosis. No aneurysm is identified. Posterior circulation: The included intracranial portion of the left vertebral artery is widely patent and supplies the basilar. The right vertebral artery is not visualized and was shown to be hypoplastic on the earlier CTA. The basilar artery is widely patent. There is a large right posterior communicating artery. 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 No significant proximal PCA stenosis is evident. No aneurysm is identified. Anatomic variants: As above. IMPRESSION: 1. Small to moderate-sized acute right MCA infarct. 2. Chronic right frontal and bilateral cerebellar infarcts. 3. Negative head MRA. Electronically Signed   By: Logan Bores M.D.   On:  12/17/2020 14:55   IR CT Head Ltd  Result Date: 12/19/2020 INDICATION: 60 year old male with a history  acute stroke right M1/right MCA syndrome, presents for angiogram and possible mechanical thrombectomy EXAM: ULTRASOUND-GUIDED ACCESS RIGHT COMMON FEMORAL ARTERY CERVICAL AND CEREBRAL ANGIOGRAM MECHANICAL THROMBECTOMY RIGHT MCA FLAT PANEL CT ANGIO-SEAL FOR HEMOSTASIS COMPARISON:  CT IMAGING SAME DAY MEDICATIONS: None ANESTHESIA/SEDATION: The anesthesia team was present to provide general endotracheal tube anesthesia and for patient monitoring during the procedure. Intubation was performed in room 2 neuro IR. Left radial arterial line was performed by the anesthesia team. Interventional neuro radiology nursing staff was also present. CONTRAST:  90 cc Omni 240 FLUOROSCOPY TIME:  Fluoroscopy Time: 19 minutes 6 seconds (1588 mGy). COMPLICATIONS: SIR LEVEL B - Normal therapy, includes overnight admission for observation. Subarachnoid hemorrhage TECHNIQUE: Informed written consent was obtained from the patient's family after a thorough discussion of the procedural risks, benefits and alternatives. Specific risks discussed include: Bleeding, infection, contrast reaction, kidney injury/failure, need for further procedure/surgery, arterial injury or dissection, embolization to new territory, intracranial hemorrhage (10-15% risk), neurologic deterioration, cardiopulmonary collapse, death. All questions were addressed. Maximal Sterile Barrier Technique was utilized including during the procedure including caps, mask, sterile gowns, sterile gloves, sterile drape, hand hygiene and skin antiseptic. A timeout was performed prior to the initiation of the procedure. The anesthesia team was present to provide general endotracheal tube anesthesia and for patient monitoring during the procedure. Interventional neuro radiology nursing staff was also present. FINDINGS: Initial Findings: Right common carotid artery:  Normal course caliber and contour. Right external carotid artery: Patent with antegrade flow. Right internal carotid artery:  Normal course caliber and contour of the cervical portion. Vertical and petrous segment patent with normal course caliber contour. Cavernous segment patent. Clinoid segment patent. Antegrade flow of the ophthalmic artery. Ophthalmic segment patent. Terminus patent. Right MCA: Occlusion was present at the branch point of superior and inferior. There had been migration of the M1 thrombus distally to the branch point, when compared to the baseline CT angiogram. The embolus had moved primarily into the inferior division, with flow maintained into the superior division on the initial angiogram. Right ACA: A 1 segment patent. Full perfusion of the right ACA territory with crossover flow into the left ACA territory via patent anterior communicating artery. Completion Findings: Right MCA: Restoration of flow through the right MCA, into the inferior and superior division branch point. The fragmented embolus entered the inferior division. The inferior division is the dominant division, with perfusion of the temporal lobe and the parietal lobe, with the superior segment perfusing essentially the frontal region only. After treatment, there is restoration of the proximal flow, with slow flow within distal branches of the inferior division, affecting both parietal and temporal lobes, with TICI 2c flow: Near complete perfusion except for slow flow in a few distal cortical vessels/presence of small distal cortical emboli Left: Left common carotid artery:  Normal course caliber and contour. Left external carotid artery: Patent with antegrade flow. Left internal carotid artery: Normal course caliber and contour of the cervical portion. Mild atherosclerotic plaque at the origin of the left ICA without significant stenosis. Vertical and petrous segment patent with normal course caliber contour. Cavernous segment patent. Clinoid segment patent. Antegrade flow of the ophthalmic artery. Ophthalmic segment patent. Terminus patent. Left  MCA: M1 segment patent. Insular and opercular segments patent. Unremarkable caliber and course of the cortical segments. Typical arterial, capillary/ parenchymal, and venous phase. Left ACA: A 1 segment patent. A  2 segment perfuses the right ACA territory. Flat panel CT: Small volume subarachnoid hemorrhage within the right sulci, [place] IIIC PROCEDURE: The anesthesia team was present to provide general endotracheal tube anesthesia and for patient monitoring during the procedure. Intubation was performed in negative pressure [place] in neuro IR holding. Interventional neuro radiology nursing staff was also present. Ultrasound survey of the right inguinal region was performed with images stored and sent to PACs. 11 blade scalpel was used to make a small incision. Blunt dissection was performed with US guidance. A micropuncture needle was used access the right common femoral artery under ultrasound. With excellent arterial blood flow returned, an .018 micro wire was passed through the needle, observed to enter the abdominal aorta under fluoroscopy. The needle was removed, and a micropuncture sheath was placed over the wire. The inner dilator and wire were removed, and an 035 wire was advanced under fluoroscopy into the abdominal aorta. The sheath was removed and a 25cm 62F straight vascular sheath was placed. The dilator was removed and the sheath was flushed. Sheath was attached to pressurized and heparinized saline bag for constant forward flow. A coaxial system was then advanced over the 035 wire. This included a 95cm 087 "[place]" balloon guide with coaxial 125cm Berenstein diagnostic catheter. This was advanced to the proximal descending thoracic aorta. Wire was then removed. Double flush of the catheter was performed. Catheter was then used to select the innominate artery. Angiogram was performed. Using roadmap technique, the catheter was advanced over a standard glide wire into right cervical ICA, with distal  position achieved of the balloon guide. The diagnostic catheter and the wire were removed. Formal angiogram was performed. Road map function was used once the occluded vessel was identified. Copious back flush was performed and the balloon catheter was attached to heparinized and pressurized saline bag for forward flow. A second coaxial system was then advanced through the balloon catheter, which included the selected intermediate catheter, microcatheter, and microwire. In this scenario, the set up included a zoom 71 aspiration catheter, a Trevo Provue18 microcatheter, and 014 synchro soft wire. This system was advanced through the balloon guide catheter under the road-map function, with adequate back-flush at the rotating hemostatic valve at that back end of the balloon guide. Once the wire and microcatheter were into the laceral segment, we attach heparinized saline pressured flush to the guide. Microcatheter and the intermediate catheter system were advanced through the terminal ICA and MCA gently to the proximal M1 segment, and were advanced up to but not through the occluded segment. The zoom catheter was then advanced over the microwire into the proximal M1 segment. The microcatheter microwire were then removed. The proprietary engine was attached to the hub of the aspiration catheter confirming return of flow. Catheter was then gently advanced into the embolus/thrombus at the site of occlusion. Approximately 1 minute of time was observed and then the catheter was withdrawn. Catheter was withdrawn into the balloon guide with free aspiration performed at the hub of the aspiration catheter. Angiogram was performed, which confirmed restoration of flow through the proximal inferior segment, with distal migration of the thrombus into a distal branch. We elected to attempt a second pass. 2nd pass: Coaxial system was advanced through the balloon guide, which included a zoom 71 aspiration catheter, a Trevo Provue18  microcatheter, and 014 synchro soft wire. The system was advanced through the balloon guide catheter under the road-map function, with adequate back-flush at the rotating hemostatic valve at that back end  of the balloon guide. Microcatheter and the intermediate catheter system were advanced through the terminal ICA and MCA to the level of the bifurcation of superior/inferior division. The micro wire was then carefully advanced into the inferior segment and through the occluded segment. Microcatheter was then manipulated through the occluded segment and the wire was removed with saline drip at the hub. Blood was then aspirated through the hub of the microcatheter, and a gentle contrast injection was performed confirming intraluminal position within the inferior division. A rotating hemostatic valve was then attached to the back end of the microcatheter, and a pressurized and heparinized saline bag was attached to the catheter. 4 x 40 solitaire device was then selected. Back flush was achieved at the rotating hemostatic valve, and then the device was gently advanced through the microcatheter to the distal end. The retriever was then unsheathed by withdrawing the microcatheter under fluoroscopy. Once the retriever was completely unsheathed, the microcatheter was carefully stripped from the delivery device. Control angiogram was performed from the intermediate catheter. A 3 minute time interval was observed. Constant aspiration using the proprietary engine was then performed at the intermediate catheter, as the retriever was gently and slowly withdrawn with fluoroscopic observation. Once the retriever was "corked" within the tip of the intermediate catheter, both were removed from the system. Free aspiration was confirmed at the hub of the balloon guide catheter, with free blood return confirmed. The balloon was then deflated, and a control angiogram was performed. Improved flow, with again distal migration within the  inferior division. We elected to attempt a third pass. 3rd pass: The 014 synchro soft wire was loaded through a zoom 35 aspiration catheter. Roadmap angiogram was performed. The aspiration catheter and the microwire were then advanced to the inferior segment, just proximal to the occlusion site. Once the aspiration catheter was just proximal to the occlusion site the microwire was removed. The rotating hemostatic valve was then removed from the hub, and the proprietary engine was attached directly to the hub of the zoom 35 catheter the pump was turned on confirming backflow and the catheter was advanced until cessation of flow was observed. Catheter was advanced through the site of the occlusion and then withdrawn. Upon withdrawal, aspiration was performed at the hub of the balloon guide catheter. Once the aspiration catheter was completely removed, aspiration was performed at the hub of the balloon guide confirming free return of blood. Angiogram was performed. There was improved flow through the site of the occlusion, however, again distal migration was observed within the affected division, with again distal migration within the inferior division. We elected 1 final pass into the inferior division M3/M4 with the microcatheter. Fourth pass: A roadmap angiogram was performed. Through the balloon guide, a 160 cm Trevo Trak microcatheter was advanced with the synchro soft, into the distal aspect of the inferior division, targeting the residual, most proximal occlusive embolus of the inferior division branches. Once the microcatheter was distal to the site of the occlusion, the microwire was removed. Catheter aspiration was performed as the catheter was then withdrawn. Catheter was completely withdrawn from the system. Angiogram performed demonstrated persistent occlusion in the distal M3/M4 cortical branch of the inferior division. At this time we elected to terminate the case. Balloon guide was withdrawn. A JB 1  catheter was used to select the origin of the left CCA. Angiogram was performed. JB 1 catheter was then removed. The skin at the puncture site was then cleaned with Chlorhexidine. The 8 [place]  sheath was removed and an 47F angioseal was deployed. Flat panel CT was performed. Patient was extubated once the CT was reviewed. Patient tolerated the procedure well and remained hemodynamically stable throughout. No complications were encountered and no significant blood loss encountered. IMPRESSION: Status post ultrasound guided access right common femoral artery for mechanical thrombectomy of right-sided MCA ELVO, with 4 passes achieving TICI 2c flow at the completion. Angiogram of the left ICA demonstrates no occlusive branches intracranial. Angio-Seal restored for hemostasis. Signed, Dulcy Fanny. Dellia Nims, RPVI [facility] Radiology PLAN: Patient extubated ICU status Target systolic blood pressure of 120-140 Right hip straight time 4 hours Frequent neurovascular checks Repeat neurologic imaging with CT and/MRI at the discretion of neurology team Electronically Signed   By: Corrie Mckusick D.O.   On: 12/18/2020 21:30   IR US Guide Vasc Access Right  Result Date: 12/19/2020 INDICATION: 60 year old male with a history acute stroke right M1/right MCA syndrome, presents for angiogram and possible mechanical thrombectomy EXAM: ULTRASOUND-GUIDED ACCESS RIGHT COMMON FEMORAL ARTERY CERVICAL AND CEREBRAL ANGIOGRAM MECHANICAL THROMBECTOMY RIGHT MCA FLAT PANEL CT ANGIO-SEAL FOR HEMOSTASIS COMPARISON:  CT IMAGING SAME DAY MEDICATIONS: None ANESTHESIA/SEDATION: The anesthesia team was present to provide general endotracheal tube anesthesia and for patient monitoring during the procedure. Intubation was performed in room 2 neuro IR. Left radial arterial line was performed by the anesthesia team. Interventional neuro radiology nursing staff was also present. CONTRAST:  90 cc Omni 240  FLUOROSCOPY TIME:  Fluoroscopy Time: 19 minutes 6 seconds (1588 mGy). COMPLICATIONS: SIR LEVEL B - Normal therapy, includes overnight admission for observation. Subarachnoid hemorrhage TECHNIQUE: Informed written consent was obtained from the patient's family after a thorough discussion of the procedural risks, benefits and alternatives. Specific risks discussed include: Bleeding, infection, contrast reaction, kidney injury/failure, need for further procedure/surgery, arterial injury or dissection, embolization to new territory, intracranial hemorrhage (10-15% risk), neurologic deterioration, cardiopulmonary collapse, death. All questions were addressed. Maximal Sterile Barrier Technique was utilized including during the procedure including caps, mask, sterile gowns, sterile gloves, sterile drape, hand hygiene and skin antiseptic. A timeout was performed prior to the initiation of the procedure. The anesthesia team was present to provide general endotracheal tube anesthesia and for patient monitoring during the procedure. Interventional neuro radiology nursing staff was also present. FINDINGS: Initial Findings: Right common carotid artery:  Normal course caliber and contour. Right external carotid artery: Patent with antegrade flow. Right internal carotid artery: Normal course caliber and contour of the cervical portion. Vertical and petrous segment patent with normal course caliber contour. Cavernous segment patent. Clinoid segment patent. Antegrade flow of the ophthalmic artery. Ophthalmic segment patent. Terminus patent. Right MCA: Occlusion was present at the branch point of superior and inferior. There had been migration of the M1 thrombus distally to the branch point, when compared to the baseline CT angiogram. The embolus had moved primarily into the inferior division, with flow maintained into the superior division on the initial angiogram. Right ACA: A 1 segment patent. Full perfusion of the right ACA  territory with crossover flow into the left ACA territory via patent anterior communicating artery. Completion Findings: Right MCA: Restoration of flow through the right MCA, into the inferior and superior division branch point. The fragmented embolus entered the inferior division. The inferior division is the dominant division, with perfusion of the temporal lobe and the parietal lobe, with the superior segment perfusing essentially the frontal region only. After treatment, there is restoration of the proximal flow, with  slow flow within distal branches of the inferior division, affecting both parietal and temporal lobes, with TICI 2c flow: Near complete perfusion except for slow flow in a few distal cortical vessels/presence of small distal cortical emboli Left: Left common carotid artery:  Normal course caliber and contour. Left external carotid artery: Patent with antegrade flow. Left internal carotid artery: Normal course caliber and contour of the cervical portion. Mild atherosclerotic plaque at the origin of the left ICA without significant stenosis. Vertical and petrous segment patent with normal course caliber contour. Cavernous segment patent. Clinoid segment patent. Antegrade flow of the ophthalmic artery. Ophthalmic segment patent. Terminus patent. Left MCA: M1 segment patent. Insular and opercular segments patent. Unremarkable caliber and course of the cortical segments. Typical arterial, capillary/ parenchymal, and venous phase. Left ACA: A 1 segment patent. A 2 segment perfuses the right ACA territory. Flat panel CT: Small volume subarachnoid hemorrhage within the right sulci, [place] IIIC PROCEDURE: The anesthesia team was present to provide general endotracheal tube anesthesia and for patient monitoring during the procedure. Intubation was performed in negative pressure [place] in neuro IR holding. Interventional neuro radiology nursing staff was also present. Ultrasound survey of the right inguinal  region was performed with images stored and sent to PACs. 11 blade scalpel was used to make a small incision. Blunt dissection was performed with US guidance. A micropuncture needle was used access the right common femoral artery under ultrasound. With excellent arterial blood flow returned, an .018 micro wire was passed through the needle, observed to enter the abdominal aorta under fluoroscopy. The needle was removed, and a micropuncture sheath was placed over the wire. The inner dilator and wire were removed, and an 035 wire was advanced under fluoroscopy into the abdominal aorta. The sheath was removed and a 25cm 49F straight vascular sheath was placed. The dilator was removed and the sheath was flushed. Sheath was attached to pressurized and heparinized saline bag for constant forward flow. A coaxial system was then advanced over the 035 wire. This included a 95cm 087 "Walrus" balloon guide with coaxial 125cm Berenstein diagnostic catheter. This was advanced to the proximal descending thoracic aorta. Wire was then removed. Double flush of the catheter was performed. Catheter was then used to select the innominate artery. Angiogram was performed. Using roadmap technique, the catheter was advanced over a standard glide wire into right cervical ICA, with distal position achieved of the balloon guide. The diagnostic catheter and the wire were removed. Formal angiogram was performed. Road map function was used once the occluded vessel was identified. Copious back flush was performed and the balloon catheter was attached to heparinized and pressurized saline bag for forward flow. A second coaxial system was then advanced through the balloon catheter, which included the selected intermediate catheter, microcatheter, and microwire. In this scenario, the set up included a zoom 71 aspiration catheter, a Trevo Provue18 microcatheter, and 014 synchro soft wire. This system was advanced through the balloon guide catheter  under the road-map function, with adequate back-flush at the rotating hemostatic valve at that back end of the balloon guide. Once the wire and microcatheter were into the laceral segment, we attach heparinized saline pressured flush to the guide. Microcatheter and the intermediate catheter system were advanced through the terminal ICA and MCA gently to the proximal M1 segment, and were advanced up to but not through the occluded segment. The zoom catheter was then advanced over the microwire into the proximal M1 segment. The microcatheter microwire were then removed.  The proprietary engine was attached to the hub of the aspiration catheter confirming return of flow. Catheter was then gently advanced into the embolus/thrombus at the site of occlusion. Approximately 1 minute of time was observed and then the catheter was withdrawn. Catheter was withdrawn into the balloon guide with free aspiration performed at the hub of the aspiration catheter. Angiogram was performed, which confirmed restoration of flow through the proximal inferior segment, with distal migration of the thrombus into a distal branch. We elected to attempt a second pass. 2nd pass: Coaxial system was advanced through the balloon guide, which included a zoom 71 aspiration catheter, a Trevo Provue18 microcatheter, and 014 synchro soft wire. The system was advanced through the balloon guide catheter under the road-map function, with adequate back-flush at the rotating hemostatic valve at that back end of the balloon guide. Microcatheter and the intermediate catheter system were advanced through the terminal ICA and MCA to the level of the bifurcation of superior/inferior division. The micro wire was then carefully advanced into the inferior segment and through the occluded segment. Microcatheter was then manipulated through the occluded segment and the wire was removed with saline drip at the hub. Blood was then aspirated through the hub of the  microcatheter, and a gentle contrast injection was performed confirming intraluminal position within the inferior division. A rotating hemostatic valve was then attached to the back end of the microcatheter, and a pressurized and heparinized saline bag was attached to the catheter. 4 x 40 solitaire device was then selected. Back flush was achieved at the rotating hemostatic valve, and then the device was gently advanced through the microcatheter to the distal end. The retriever was then unsheathed by withdrawing the microcatheter under fluoroscopy. Once the retriever was completely unsheathed, the microcatheter was carefully stripped from the delivery device. Control angiogram was performed from the intermediate catheter. A 3 minute time interval was observed. Constant aspiration using the proprietary engine was then performed at the intermediate catheter, as the retriever was gently and slowly withdrawn with fluoroscopic observation. Once the retriever was "corked" within the tip of the intermediate catheter, both were removed from the system. Free aspiration was confirmed at the hub of the balloon guide catheter, with free blood return confirmed. The balloon was then deflated, and a control angiogram was performed. Improved flow, with again distal migration within the inferior division. We elected to attempt a third pass. 3rd pass: The 014 synchro soft wire was loaded through a zoom 35 aspiration catheter. Roadmap angiogram was performed. The aspiration catheter and the microwire were then advanced to the inferior segment, just proximal to the occlusion site. Once the aspiration catheter was just proximal to the occlusion site the microwire was removed. The rotating hemostatic valve was then removed from the hub, and the proprietary engine was attached directly to the hub of the zoom 35 catheter the pump was turned on confirming backflow and the catheter was advanced until cessation of flow was observed. Catheter  was advanced through the site of the occlusion and then withdrawn. Upon withdrawal, aspiration was performed at the hub of the balloon guide catheter. Once the aspiration catheter was completely removed, aspiration was performed at the hub of the balloon guide confirming free return of blood. Angiogram was performed. There was improved flow through the site of the occlusion, however, again distal migration was observed within the affected division, with again distal migration within the inferior division. We elected 1 final pass into the inferior division M3/M4 with the  microcatheter. Fourth pass: A roadmap angiogram was performed. Through the balloon guide, a 160 cm Trevo Trak microcatheter was advanced with the synchro soft, into the distal aspect of the inferior division, targeting the residual, most proximal occlusive embolus of the inferior division branches. Once the microcatheter was distal to the site of the occlusion, the microwire was removed. Catheter aspiration was performed as the catheter was then withdrawn. Catheter was completely withdrawn from the system. Angiogram performed demonstrated persistent occlusion in the distal M3/M4 cortical branch of the inferior division. At this time we elected to terminate the case. Balloon guide was withdrawn. A JB 1 catheter was used to select the origin of the left CCA. Angiogram was performed. JB 1 catheter was then removed. The skin at the puncture site was then cleaned with Chlorhexidine. The 8 French sheath was removed and an 60F angioseal was deployed. Flat panel CT was performed. Patient was extubated once the CT was reviewed. Patient tolerated the procedure well and remained hemodynamically stable throughout. No complications were encountered and no significant blood loss encountered. IMPRESSION: Status post ultrasound guided access right common femoral artery for mechanical thrombectomy of right-sided MCA ELVO, with 4 passes achieving TICI 2c flow at the  completion. Angiogram of the left ICA demonstrates no occlusive branches intracranial. Angio-Seal restored for hemostasis. Signed, Dulcy Fanny. Dellia Nims, RPVI Vascular and Interventional Radiology Specialists [facility] Radiology PLAN: Patient extubated ICU status Target systolic blood pressure of 120-140 Right hip straight time 4 hours Frequent neurovascular checks Repeat neurologic imaging with CT and/MRI at the discretion of neurology team Electronically Signed   By: Corrie Mckusick D.O.   On: 12/18/2020 21:30   MR CARDIAC MORPHOLOGY W WO CONTRAST  Result Date: 12/19/2020 CLINICAL DATA:  Evaluate for LV thrombus EXAM: CARDIAC MRI TECHNIQUE: The patient was scanned on a 1.5 Tesla Siemens magnet. A dedicated cardiac coil was used. Functional imaging was done using Fiesta sequences. 2,3, and 4 chamber views were done to assess for RWMA's. Modified Simpson's rule using a short axis stack was used to calculate an ejection fraction on a dedicated work Conservation officer, nature. The patient received 14 cc of Gadavist. After 10 minutes inversion recovery sequences were used to assess for infiltration and scar tissue. CONTRAST:  14 cc  of Gadavist FINDINGS: Left ventricle: -Normal size -Low normal systolic function.  Akinesis of apex -LV apical thrombus measuring 86mm x 52mm -Mild nonspecific ECV elevation (30%) -Subendocardial LGE in mid to apical anteroseptum as well as part of the mid to apical anterior wall and the apical inferior wall and apex. LGE >50% transmural at apex, suggesting nonviability. Remainder of myocardium appears viable. LV EF: 51% (Normal 56-78%) Absolute volumes: LV EDV: 124mL (Normal 77-195 mL) LV ESV: 53mL (Normal 19-72 mL) LV SV: 65mL (Normal 51-133 mL) CO: 4.3L/min (Normal 2.8-8.8 L/min) Indexed volumes: LV EDV: 81mL/sq-m (Normal 47-92 mL/sq-m) LV ESV: 31mL/sq-m (Normal 13-30 mL/sq-m) LV SV: 70mL/sq-m (Normal 32-62 mL/sq-m) CI: 1.8L/min/sq-m (Normal 1.7-4.2 L/min/sq-m) Right ventricle: Normal  size and systolic function RV EF:  59% (Normal 47-74%) Absolute volumes: RV EDV: 14mL (Normal 88-227 mL) RV ESV: 76mL (Normal 23-103 mL) RV SV: 72mL (Normal 52-138 mL) CO: 4.6L/min (Normal 2.8-8.8 L/min) Indexed volumes: RV EDV: 5mL/sq-m (Normal 55-105 mL/sq-m) RV ESV: 39mL/sq-m (Normal 15-43 mL/sq-m) RV SV: 46mL/sq-m (Normal 32-64 mL/sq-m) CI: 2.0L/min/sq-m (Normal 1.7-4.2 L/min/sq-m) Left atrium: Normal size Right atrium: Normal size Mitral valve: Trivial regurgitation Aortic valve: No regurgitation Tricuspid valve: No regurgitation Pulmonic valve: No regurgitation Aorta: Normal  proximal ascending aorta Coronary arteries: Normal origins Pericardium: Normal IMPRESSION: 1. LV apical thrombus measuring 21mm x 64mm 2. Subendocardial late gadolinium enhancement consistent with prior infarct in part of the mid to apical anterior wall, mid anteroseptal wall, apical septal/inferior walls, and apex. LGE is less than 50% transmural in the mid anteroseptal/anterior walls and apical septal/anterior/inferior walls, suggesting viability. LGE is greater than 50% transmural at the apex, suggesting apex is not viable. 3. Normal LV size with low normal systolic function (EF 35%). Akinesis of apex. 4.  Normal RV size and systolic function (EF 45%) Electronically Signed   By: Oswaldo Milian M.D.   On: 12/19/2020 19:05   ECHOCARDIOGRAM COMPLETE  Result Date: 12/17/2020    ECHOCARDIOGRAM REPORT   Patient Name:   Karl Morgan Date of Exam: 12/17/2020 Medical Rec #:  [id number]      Height:       72.0 in Accession #:    [id number]     Weight:       245.1 lb Date of Birth:  1960/10/23      BSA:          2.323 m Patient Age:    29 years       BP:           122/72 mmHg Patient Gender: M              HR:           72 bpm. Exam Location:  Inpatient Procedure: 2D Echo, Cardiac Doppler and Color Doppler Indications:    Stroke 434.91 / I163.9  History:        Patient has prior history of Echocardiogram examinations, most                  recent 08/03/2019. Previous Myocardial Infarction, Stroke, Mitral                 Valve Disease; Risk Factors:Hypertension, Diabetes and                 Dyslipidemia. Substance abuse ([place]) (From Hx).  Sonographer:    Leavy Cella RDCS Sonographer#2:  Alvino Chapel RCS Referring Phys: [phone] MCNEILL P KIRKPATRICK IMPRESSIONS  1. Definity contrast utilized. There is a nonmobile LV mural thrombus (approximately 1.2 x 0.5 cm) in region of akinesis involving the apical septal wall. Reported to covering team at 5:10 PM.  2. Left ventricular ejection fraction, by estimation, is 60 to 65%. The left ventricle has normal function. The left ventricle demonstrates regional wall motion abnormalities (see scoring diagram/findings for description). There is mild left ventricular  hypertrophy. Left ventricular diastolic parameters are indeterminate.  3. Right ventricular systolic function is normal. The right ventricular size is normal. Tricuspid regurgitation signal is inadequate for assessing PA pressure.  4. The mitral valve is grossly normal. Trivial mitral valve regurgitation.  5. The aortic valve is tricuspid. Aortic valve regurgitation is not visualized.  6. The inferior vena cava is normal in size with greater than 50% respiratory variability, suggesting right atrial pressure of 3 mmHg. Comparison(s): Prior images reviewed side by side. LV mural thrombus newly identified. Prior study from May 2021 did have similar apical septal wall motion abnormality. FINDINGS  Left Ventricle: Left ventricular ejection fraction, by estimation, is 60 to 65%. The left ventricle has normal function. The left ventricle demonstrates regional wall motion abnormalities. Definity contrast agent was given IV to delineate the left ventricular endocardial borders. The left ventricular internal cavity size was  normal in size. There is mild left ventricular hypertrophy. Left ventricular diastolic parameters are indeterminate.  LV Wall Scoring: The  apical septal segment and apex are akinetic. The apical anterior segment is hypokinetic. The anterior wall, entire lateral wall, anterior septum, entire inferior wall, mid inferoseptal segment, and basal inferoseptal segment are normal. Right Ventricle: The right ventricular size is normal. No increase in right ventricular wall thickness. Right ventricular systolic function is normal. Tricuspid regurgitation signal is inadequate for assessing PA pressure. Left Atrium: Left atrial size was normal in size. Right Atrium: Right atrial size was normal in size. Pericardium: There is no evidence of pericardial effusion. Presence of pericardial fat pad. Mitral Valve: The mitral valve is grossly normal. Trivial mitral valve regurgitation. Tricuspid Valve: The tricuspid valve is grossly normal. Tricuspid valve regurgitation is trivial. Aortic Valve: The aortic valve is tricuspid. There is mild aortic valve annular calcification. Aortic valve regurgitation is not visualized. Pulmonic Valve: The pulmonic valve was not well visualized. Pulmonic valve regurgitation is not visualized. Aorta: The aortic root is normal in size and structure. Venous: The inferior vena cava is normal in size with greater than 50% respiratory variability, suggesting right atrial pressure of 3 mmHg. IAS/Shunts: No atrial level shunt detected by color flow Doppler.  LEFT VENTRICLE PLAX 2D LVIDd:         5.40 cm  Diastology LVIDs:         3.50 cm  LV e' medial:    6.85 cm/s LV PW:         1.10 cm  LV E/e' medial:  8.9 LV IVS:        1.10 cm  LV e' lateral:   7.83 cm/s LVOT diam:     2.00 cm  LV E/e' lateral: 7.8 LVOT Area:     3.14 cm  RIGHT VENTRICLE RV S prime:     15.90 cm/s TAPSE (M-mode): 2.4 cm LEFT ATRIUM             Index       RIGHT ATRIUM          Index LA diam:        3.50 cm 1.51 cm/m  RA Area:     9.85 cm LA Vol (A2C):   48.8 ml 21.01 ml/m RA Volume:   21.30 ml 9.17 ml/m LA Vol (A4C):   18.1 ml 7.79 ml/m LA Biplane Vol: 30.1 ml 12.96  ml/m   AORTA Ao Root diam: 2.80 cm MITRAL VALVE MV Area (PHT): 3.08 cm    SHUNTS MV Decel Time: 246 msec    Systemic Diam: 2.00 cm MV E velocity: 60.80 cm/s MV A velocity: 78.60 cm/s MV E/A ratio:  0.77 Rozann Lesches MD Electronically signed by Rozann Lesches MD Signature Date/Time: 12/17/2020/5:19:12 PM    Final    CUP PACEART REMOTE DEVICE CHECK  Result Date: 12/14/2020 ILR summary report received. Battery status OK. Normal device function. No new symptom, tachy, brady, or pause episodes. No new AF episodes. Monthly summary reports and ROV/PRN  IR PERCUTANEOUS ART THROMBECTOMY/INFUSION INTRACRANIAL INC DIAG ANGIO  Result Date: 12/19/2020 INDICATION: 60 year old male with a history acute stroke right M1/right MCA syndrome, presents for angiogram and possible mechanical thrombectomy EXAM: ULTRASOUND-GUIDED ACCESS RIGHT COMMON FEMORAL ARTERY CERVICAL AND CEREBRAL ANGIOGRAM MECHANICAL THROMBECTOMY RIGHT MCA FLAT PANEL CT ANGIO-SEAL FOR HEMOSTASIS COMPARISON:  CT IMAGING SAME DAY MEDICATIONS: None ANESTHESIA/SEDATION: The anesthesia team was present to provide general endotracheal tube anesthesia and for patient monitoring during the procedure.  Intubation was performed in room 2 neuro IR. Left radial arterial line was performed by the anesthesia team. Interventional neuro radiology nursing staff was also present. CONTRAST:  90 cc Omni 240 FLUOROSCOPY TIME:  Fluoroscopy Time: 19 minutes 6 seconds (1588 mGy). COMPLICATIONS: SIR LEVEL B - Normal therapy, includes overnight admission for observation. Subarachnoid hemorrhage TECHNIQUE: Informed written consent was obtained from the patient's family after a thorough discussion of the procedural risks, benefits and alternatives. Specific risks discussed include: Bleeding, infection, contrast reaction, kidney injury/failure, need for further procedure/surgery, arterial injury or dissection, embolization to new territory, intracranial hemorrhage (10-15% risk),  neurologic deterioration, cardiopulmonary collapse, death. All questions were addressed. Maximal Sterile Barrier Technique was utilized including during the procedure including caps, mask, sterile gowns, sterile gloves, sterile drape, hand hygiene and skin antiseptic. A timeout was performed prior to the initiation of the procedure. The anesthesia team was present to provide general endotracheal tube anesthesia and for patient monitoring during the procedure. Interventional neuro radiology nursing staff was also present. FINDINGS: Initial Findings: Right common carotid artery:  Normal course caliber and contour. Right external carotid artery: Patent with antegrade flow. Right internal carotid artery: Normal course caliber and contour of the cervical portion. Vertical and petrous segment patent with normal course caliber contour. Cavernous segment patent. Clinoid segment patent. Antegrade flow of the ophthalmic artery. Ophthalmic segment patent. Terminus patent. Right MCA: Occlusion was present at the branch point of superior and inferior. There had been migration of the M1 thrombus distally to the branch point, when compared to the baseline CT angiogram. The embolus had moved primarily into the inferior division, with flow maintained into the superior division on the initial angiogram. Right ACA: A 1 segment patent. Full perfusion of the right ACA territory with crossover flow into the left ACA territory via patent anterior communicating artery. Completion Findings: Right MCA: Restoration of flow through the right MCA, into the inferior and superior division branch point. The fragmented embolus entered the inferior division. The inferior division is the dominant division, with perfusion of the temporal lobe and the parietal lobe, with the superior segment perfusing essentially the frontal region only. After treatment, there is restoration of the proximal flow, with slow flow within distal branches of the inferior  division, affecting both parietal and temporal lobes, with TICI 2c flow: Near complete perfusion except for slow flow in a few distal cortical vessels/presence of small distal cortical emboli Left: Left common carotid artery:  Normal course caliber and contour. Left external carotid artery: Patent with antegrade flow. Left internal carotid artery: Normal course caliber and contour of the cervical portion. Mild atherosclerotic plaque at the origin of the left ICA without significant stenosis. Vertical and petrous segment patent with normal course caliber contour. Cavernous segment patent. Clinoid segment patent. Antegrade flow of the ophthalmic artery. Ophthalmic segment patent. Terminus patent. Left MCA: M1 segment patent. Insular and opercular segments patent. Unremarkable caliber and course of the cortical segments. Typical arterial, capillary/ parenchymal, and venous phase. Left ACA: A 1 segment patent. A 2 segment perfuses the right ACA territory. Flat panel CT: Small volume subarachnoid hemorrhage within the right sulci, [place] IIIC PROCEDURE: The anesthesia team was present to provide general endotracheal tube anesthesia and for patient monitoring during the procedure. Intubation was performed in negative pressure [place] in neuro IR holding. Interventional neuro radiology nursing staff was also present. Ultrasound survey of the right inguinal region was performed with images stored and sent to PACs. 11 blade scalpel was used to  make a small incision. Blunt dissection was performed with US guidance. A micropuncture needle was used access the right common femoral artery under ultrasound. With excellent arterial blood flow returned, an .018 micro wire was passed through the needle, observed to enter the abdominal aorta under fluoroscopy. The needle was removed, and a micropuncture sheath was placed over the wire. The inner dilator and wire were removed, and an 035 wire was advanced under fluoroscopy into the  abdominal aorta. The sheath was removed and a 25cm 5F straight vascular sheath was placed. The dilator was removed and the sheath was flushed. Sheath was attached to pressurized and heparinized saline bag for constant forward flow. A coaxial system was then advanced over the 035 wire. This included a 95cm 087 "Walrus" balloon guide with coaxial 125cm Berenstein diagnostic catheter. This was advanced to the proximal descending thoracic aorta. Wire was then removed. Double flush of the catheter was performed. Catheter was then used to select the innominate artery. Angiogram was performed. Using roadmap technique, the catheter was advanced over a standard glide wire into right cervical ICA, with distal position achieved of the balloon guide. The diagnostic catheter and the wire were removed. Formal angiogram was performed. Road map function was used once the occluded vessel was identified. Copious back flush was performed and the balloon catheter was attached to heparinized and pressurized saline bag for forward flow. A second coaxial system was then advanced through the balloon catheter, which included the selected intermediate catheter, microcatheter, and microwire. In this scenario, the set up included a zoom 71 aspiration catheter, a Trevo Provue18 microcatheter, and 014 synchro soft wire. This system was advanced through the balloon guide catheter under the road-map function, with adequate back-flush at the rotating hemostatic valve at that back end of the balloon guide. Once the wire and microcatheter were into the laceral segment, we attach heparinized saline pressured flush to the guide. Microcatheter and the intermediate catheter system were advanced through the terminal ICA and MCA gently to the proximal M1 segment, and were advanced up to but not through the occluded segment. The zoom catheter was then advanced over the microwire into the proximal M1 segment. The microcatheter microwire were then removed.  The proprietary engine was attached to the hub of the aspiration catheter confirming return of flow. Catheter was then gently advanced into the embolus/thrombus at the site of occlusion. Approximately 1 minute of time was observed and then the catheter was withdrawn. Catheter was withdrawn into the balloon guide with free aspiration performed at the hub of the aspiration catheter. Angiogram was performed, which confirmed restoration of flow through the proximal inferior segment, with distal migration of the thrombus into a distal branch. We elected to attempt a second pass. 2nd pass: Coaxial system was advanced through the balloon guide, which included a zoom 71 aspiration catheter, a Trevo Provue18 microcatheter, and 014 synchro soft wire. The system was advanced through the balloon guide catheter under the road-map function, with adequate back-flush at the rotating hemostatic valve at that back end of the balloon guide. Microcatheter and the intermediate catheter system were advanced through the terminal ICA and MCA to the level of the bifurcation of superior/inferior division. The micro wire was then carefully advanced into the inferior segment and through the occluded segment. Microcatheter was then manipulated through the occluded segment and the wire was removed with saline drip at the hub. Blood was then aspirated through the hub of the microcatheter, and a gentle contrast injection was performed confirming  intraluminal position within the inferior division. A rotating hemostatic valve was then attached to the back end of the microcatheter, and a pressurized and heparinized saline bag was attached to the catheter. 4 x 40 solitaire device was then selected. Back flush was achieved at the rotating hemostatic valve, and then the device was gently advanced through the microcatheter to the distal end. The retriever was then unsheathed by withdrawing the microcatheter under fluoroscopy. Once the retriever was  completely unsheathed, the microcatheter was carefully stripped from the delivery device. Control angiogram was performed from the intermediate catheter. A 3 minute time interval was observed. Constant aspiration using the proprietary engine was then performed at the intermediate catheter, as the retriever was gently and slowly withdrawn with fluoroscopic observation. Once the retriever was "corked" within the tip of the intermediate catheter, both were removed from the system. Free aspiration was confirmed at the hub of the balloon guide catheter, with free blood return confirmed. The balloon was then deflated, and a control angiogram was performed. Improved flow, with again distal migration within the inferior division. We elected to attempt a third pass. 3rd pass: The 014 synchro soft wire was loaded through a zoom 35 aspiration catheter. Roadmap angiogram was performed. The aspiration catheter and the microwire were then advanced to the inferior segment, just proximal to the occlusion site. Once the aspiration catheter was just proximal to the occlusion site the microwire was removed. The rotating hemostatic valve was then removed from the hub, and the proprietary engine was attached directly to the hub of the zoom 35 catheter the pump was turned on confirming backflow and the catheter was advanced until cessation of flow was observed. Catheter was advanced through the site of the occlusion and then withdrawn. Upon withdrawal, aspiration was performed at the hub of the balloon guide catheter. Once the aspiration catheter was completely removed, aspiration was performed at the hub of the balloon guide confirming free return of blood. Angiogram was performed. There was improved flow through the site of the occlusion, however, again distal migration was observed within the affected division, with again distal migration within the inferior division. We elected 1 final pass into the inferior division M3/M4 with the  microcatheter. Fourth pass: A roadmap angiogram was performed. Through the balloon guide, a 160 cm Trevo Trak microcatheter was advanced with the synchro soft, into the distal aspect of the inferior division, targeting the residual, most proximal occlusive embolus of the inferior division branches. Once the microcatheter was distal to the site of the occlusion, the microwire was removed. Catheter aspiration was performed as the catheter was then withdrawn. Catheter was completely withdrawn from the system. Angiogram performed demonstrated persistent occlusion in the distal M3/M4 cortical branch of the inferior division. At this time we elected to terminate the case. Balloon guide was withdrawn. A JB 1 catheter was used to select the origin of the left CCA. Angiogram was performed. JB 1 catheter was then removed. The skin at the puncture site was then cleaned with Chlorhexidine. The 8 French sheath was removed and an 71F angioseal was deployed. Flat panel CT was performed. Patient was extubated once the CT was reviewed. Patient tolerated the procedure well and remained hemodynamically stable throughout. No complications were encountered and no significant blood loss encountered. IMPRESSION: Status post ultrasound guided access right common femoral artery for mechanical thrombectomy of right-sided MCA ELVO, with 4 passes achieving TICI 2c flow at the completion. Angiogram of the left ICA demonstrates no occlusive branches intracranial. Angio-Seal  restored for hemostasis. Signed, Dulcy Fanny. Dellia Nims, RPVI [facility] Radiology PLAN: Patient extubated ICU status Target systolic blood pressure of 120-140 Right hip straight time 4 hours Frequent neurovascular checks Repeat neurologic imaging with CT and/MRI at the discretion of neurology team Electronically Signed   By: Corrie Mckusick D.O.   On: 12/18/2020 21:30   CT HEAD CODE STROKE WO CONTRAST  Result Date:  12/17/2020 CLINICAL DATA:  Code stroke.  Left-sided weakness EXAM: CT HEAD WITHOUT CONTRAST TECHNIQUE: Contiguous axial images were obtained from the base of the skull through the vertex without intravenous contrast. COMPARISON:  08/02/2019 FINDINGS: Brain: Small chronic high right frontal cortex infarct. No convincing acute infarct. No acute hemorrhage. Remote bilateral cerebellar infarcts. No hydrocephalus or mass. Vascular: Hyperdense right M1 segment Skull: Negative Sinuses/Orbits: Negative Other: Critical Value/emergent results were called by telephone at the time of interpretation on 12/17/2020 at 5:24 am to provider [facility] , who verbally acknowledged these results. ASPECTS [facility] Stroke Program Early CT Score) - Ganglionic level infarction (caudate, lentiform nuclei, internal capsule, insula, M1-M3 cortex): 7 - Supraganglionic infarction (M4-M6 cortex): 3 when accounting for chronic cortex infarct Total score (0-10 with 10 being normal): 10 IMPRESSION: 1. Hyperdense right M1 segment, reference CTA. 2. No acute hemorrhage or visible acute infarct. 3. Remote right frontal cortex and bilateral cerebellar infarcts. Electronically Signed   By: Jorje Guild M.D.   On: 12/17/2020 05:25   IR ANGIO INTRA EXTRACRAN SEL COM CAROTID INNOMINATE UNI L MOD SED  Result Date: 12/19/2020 INDICATION: 60 year old male with a history acute stroke right M1/right MCA syndrome, presents for angiogram and possible mechanical thrombectomy EXAM: ULTRASOUND-GUIDED ACCESS RIGHT COMMON FEMORAL ARTERY CERVICAL AND CEREBRAL ANGIOGRAM MECHANICAL THROMBECTOMY RIGHT MCA FLAT PANEL CT ANGIO-SEAL FOR HEMOSTASIS COMPARISON:  CT IMAGING SAME DAY MEDICATIONS: None ANESTHESIA/SEDATION: The anesthesia team was present to provide general endotracheal tube anesthesia and for patient monitoring during the procedure. Intubation was performed in room 2 neuro IR. Left radial arterial line was performed by the anesthesia team. Interventional  neuro radiology nursing staff was also present. CONTRAST:  90 cc Omni 240 FLUOROSCOPY TIME:  Fluoroscopy Time: 19 minutes 6 seconds (1588 mGy). COMPLICATIONS: SIR LEVEL B - Normal therapy, includes overnight admission for observation. Subarachnoid hemorrhage TECHNIQUE: Informed written consent was obtained from the patient's family after a thorough discussion of the procedural risks, benefits and alternatives. Specific risks discussed include: Bleeding, infection, contrast reaction, kidney injury/failure, need for further procedure/surgery, arterial injury or dissection, embolization to new territory, intracranial hemorrhage (10-15% risk), neurologic deterioration, cardiopulmonary collapse, death. All questions were addressed. Maximal Sterile Barrier Technique was utilized including during the procedure including caps, mask, sterile gowns, sterile gloves, sterile drape, hand hygiene and skin antiseptic. A timeout was performed prior to the initiation of the procedure. The anesthesia team was present to provide general endotracheal tube anesthesia and for patient monitoring during the procedure. Interventional neuro radiology nursing staff was also present. FINDINGS: Initial Findings: Right common carotid artery:  Normal course caliber and contour. Right external carotid artery: Patent with antegrade flow. Right internal carotid artery: Normal course caliber and contour of the cervical portion. Vertical and petrous segment patent with normal course caliber contour. Cavernous segment patent. Clinoid segment patent. Antegrade flow of the ophthalmic artery. Ophthalmic segment patent. Terminus patent. Right MCA: Occlusion was present at the branch point of superior and inferior. There had been migration of the M1 thrombus distally to the branch point, when compared to the  baseline CT angiogram. The embolus had moved primarily into the inferior division, with flow maintained into the superior division on the initial  angiogram. Right ACA: A 1 segment patent. Full perfusion of the right ACA territory with crossover flow into the left ACA territory via patent anterior communicating artery. Completion Findings: Right MCA: Restoration of flow through the right MCA, into the inferior and superior division branch point. The fragmented embolus entered the inferior division. The inferior division is the dominant division, with perfusion of the temporal lobe and the parietal lobe, with the superior segment perfusing essentially the frontal region only. After treatment, there is restoration of the proximal flow, with slow flow within distal branches of the inferior division, affecting both parietal and temporal lobes, with TICI 2c flow: Near complete perfusion except for slow flow in a few distal cortical vessels/presence of small distal cortical emboli Left: Left common carotid artery:  Normal course caliber and contour. Left external carotid artery: Patent with antegrade flow. Left internal carotid artery: Normal course caliber and contour of the cervical portion. Mild atherosclerotic plaque at the origin of the left ICA without significant stenosis. Vertical and petrous segment patent with normal course caliber contour. Cavernous segment patent. Clinoid segment patent. Antegrade flow of the ophthalmic artery. Ophthalmic segment patent. Terminus patent. Left MCA: M1 segment patent. Insular and opercular segments patent. Unremarkable caliber and course of the cortical segments. Typical arterial, capillary/ parenchymal, and venous phase. Left ACA: A 1 segment patent. A 2 segment perfuses the right ACA territory. Flat panel CT: Small volume subarachnoid hemorrhage within the right sulci, [place] IIIC PROCEDURE: The anesthesia team was present to provide general endotracheal tube anesthesia and for patient monitoring during the procedure. Intubation was performed in negative pressure [place] in neuro IR holding. Interventional neuro  radiology nursing staff was also present. Ultrasound survey of the right inguinal region was performed with images stored and sent to PACs. 11 blade scalpel was used to make a small incision. Blunt dissection was performed with US guidance. A micropuncture needle was used access the right common femoral artery under ultrasound. With excellent arterial blood flow returned, an .018 micro wire was passed through the needle, observed to enter the abdominal aorta under fluoroscopy. The needle was removed, and a micropuncture sheath was placed over the wire. The inner dilator and wire were removed, and an 035 wire was advanced under fluoroscopy into the abdominal aorta. The sheath was removed and a 25cm 42F straight vascular sheath was placed. The dilator was removed and the sheath was flushed. Sheath was attached to pressurized and heparinized saline bag for constant forward flow. A coaxial system was then advanced over the 035 wire. This included a 95cm 087 "Walrus" balloon guide with coaxial 125cm Berenstein diagnostic catheter. This was advanced to the proximal descending thoracic aorta. Wire was then removed. Double flush of the catheter was performed. Catheter was then used to select the innominate artery. Angiogram was performed. Using roadmap technique, the catheter was advanced over a standard glide wire into right cervical ICA, with distal position achieved of the balloon guide. The diagnostic catheter and the wire were removed. Formal angiogram was performed. Road map function was used once the occluded vessel was identified. Copious back flush was performed and the balloon catheter was attached to heparinized and pressurized saline bag for forward flow. A second coaxial system was then advanced through the balloon catheter, which included the selected intermediate catheter, microcatheter, and microwire. In this scenario, the set up included a  zoom 71 aspiration catheter, a Trevo Provue18 microcatheter, and 014  synchro soft wire. This system was advanced through the balloon guide catheter under the road-map function, with adequate back-flush at the rotating hemostatic valve at that back end of the balloon guide. Once the wire and microcatheter were into the laceral segment, we attach heparinized saline pressured flush to the guide. Microcatheter and the intermediate catheter system were advanced through the terminal ICA and MCA gently to the proximal M1 segment, and were advanced up to but not through the occluded segment. The zoom catheter was then advanced over the microwire into the proximal M1 segment. The microcatheter microwire were then removed. The proprietary engine was attached to the hub of the aspiration catheter confirming return of flow. Catheter was then gently advanced into the embolus/thrombus at the site of occlusion. Approximately 1 minute of time was observed and then the catheter was withdrawn. Catheter was withdrawn into the balloon guide with free aspiration performed at the hub of the aspiration catheter. Angiogram was performed, which confirmed restoration of flow through the proximal inferior segment, with distal migration of the thrombus into a distal branch. We elected to attempt a second pass. 2nd pass: Coaxial system was advanced through the balloon guide, which included a zoom 71 aspiration catheter, a Trevo Provue18 microcatheter, and 014 synchro soft wire. The system was advanced through the balloon guide catheter under the road-map function, with adequate back-flush at the rotating hemostatic valve at that back end of the balloon guide. Microcatheter and the intermediate catheter system were advanced through the terminal ICA and MCA to the level of the bifurcation of superior/inferior division. The micro wire was then carefully advanced into the inferior segment and through the occluded segment. Microcatheter was then manipulated through the occluded segment and the wire was removed with  saline drip at the hub. Blood was then aspirated through the hub of the microcatheter, and a gentle contrast injection was performed confirming intraluminal position within the inferior division. A rotating hemostatic valve was then attached to the back end of the microcatheter, and a pressurized and heparinized saline bag was attached to the catheter. 4 x 40 solitaire device was then selected. Back flush was achieved at the rotating hemostatic valve, and then the device was gently advanced through the microcatheter to the distal end. The retriever was then unsheathed by withdrawing the microcatheter under fluoroscopy. Once the retriever was completely unsheathed, the microcatheter was carefully stripped from the delivery device. Control angiogram was performed from the intermediate catheter. A 3 minute time interval was observed. Constant aspiration using the proprietary engine was then performed at the intermediate catheter, as the retriever was gently and slowly withdrawn with fluoroscopic observation. Once the retriever was "corked" within the tip of the intermediate catheter, both were removed from the system. Free aspiration was confirmed at the hub of the balloon guide catheter, with free blood return confirmed. The balloon was then deflated, and a control angiogram was performed. Improved flow, with again distal migration within the inferior division. We elected to attempt a third pass. 3rd pass: The 014 synchro soft wire was loaded through a zoom 35 aspiration catheter. Roadmap angiogram was performed. The aspiration catheter and the microwire were then advanced to the inferior segment, just proximal to the occlusion site. Once the aspiration catheter was just proximal to the occlusion site the microwire was removed. The rotating hemostatic valve was then removed from the hub, and the proprietary engine was attached directly to the hub of  the zoom 35 catheter the pump was turned on confirming backflow and  the catheter was advanced until cessation of flow was observed. Catheter was advanced through the site of the occlusion and then withdrawn. Upon withdrawal, aspiration was performed at the hub of the balloon guide catheter. Once the aspiration catheter was completely removed, aspiration was performed at the hub of the balloon guide confirming free return of blood. Angiogram was performed. There was improved flow through the site of the occlusion, however, again distal migration was observed within the affected division, with again distal migration within the inferior division. We elected 1 final pass into the inferior division M3/M4 with the microcatheter. Fourth pass: A roadmap angiogram was performed. Through the balloon guide, a 160 cm Trevo Trak microcatheter was advanced with the synchro soft, into the distal aspect of the inferior division, targeting the residual, most proximal occlusive embolus of the inferior division branches. Once the microcatheter was distal to the site of the occlusion, the microwire was removed. Catheter aspiration was performed as the catheter was then withdrawn. Catheter was completely withdrawn from the system. Angiogram performed demonstrated persistent occlusion in the distal M3/M4 cortical branch of the inferior division. At this time we elected to terminate the case. Balloon guide was withdrawn. A JB 1 catheter was used to select the origin of the left CCA. Angiogram was performed. JB 1 catheter was then removed. The skin at the puncture site was then cleaned with Chlorhexidine. The 8 French sheath was removed and an 52F angioseal was deployed. Flat panel CT was performed. Patient was extubated once the CT was reviewed. Patient tolerated the procedure well and remained hemodynamically stable throughout. No complications were encountered and no significant blood loss encountered. IMPRESSION: Status post ultrasound guided access right common femoral artery for mechanical  thrombectomy of right-sided MCA ELVO, with 4 passes achieving TICI 2c flow at the completion. Angiogram of the left ICA demonstrates no occlusive branches intracranial. Angio-Seal restored for hemostasis. Signed, Dulcy Fanny. Dellia Nims, RPVI [facility] Radiology PLAN: Patient extubated ICU status Target systolic blood pressure of 120-140 Right hip straight time 4 hours Frequent neurovascular checks Repeat neurologic imaging with CT and/MRI at the discretion of neurology team Electronically Signed   By: Corrie Mckusick D.O.   On: 12/18/2020 21:30     PHYSICAL EXAM  Temp:  [97.8 F (36.6 C)-98.7 F (37.1 C)] 97.8 F (36.6 C) (09/20 0758) Pulse Rate:  [62-85] 71 (09/20 0758) Resp:  [12-19] 17 (09/20 0758) BP: (108-134)/(58-78) 110/72 (09/20 0758) SpO2:  [94 %-100 %] 94 % (09/20 0758)  General - Well nourished, well developed, in no apparent distress.  Ophthalmologic - fundi not visualized due to noncooperation.  Cardiovascular - Regular rhythm and rate.  Neuro - awake, alert, eyes open, orientated to age and people. Expressive aphasia improving and able to say short sentences but still has word salad and paraphasic errors, following all simple commands. No gaze palsy, tracking bilaterally, visual field full, PERRL. Mild right facial droop. Tongue midline. Bilateral UEs 5/5, no drift. Bilaterally LEs 5/5, no drift. Sensation symmetrical bilaterally, b/l FTN intact, gait not tested.    ASSESSMENT/PLAN Mr. Adriel F San Morgan is a 60 y.o. left-handed male with history of stroke, diabetes, hypertension, hyperlipidemia, CAD/MI, CKD admitted for left-sided weakness and aphasia. No tPA given due to outside window.    Stroke:  right MCA infarct due to right M1 occlusion status post IR with TICI2c revascularization, embolic pattern, secondary to LV  thrombus Resultant expressive aphasia CT head no acute abnormality, old right frontal and bilateral cerebellar  infarcts.  Hyperdense right MCA sign. CTA head neck right M1 occlusion S/p IR with TICI2c revascularization of right M1 MRI right MCA moderately large infarct with petechial hemorrhage MRA right M1 patent 2D Echo nonmobile LV mural thrombus (approximately 1.2 x 0.5 cm) in region of akinesis involving the apical septal wall.  LDL 49 HgbA1c 6.7 Heparin IV for VTE prophylaxis aspirin 81 mg daily and clopidogrel 75 mg daily prior to admission, was on lovenox due to LV thrombus to bridge to coumadin Patient counseled to be compliant with his antithrombotic medications Ongoing aggressive stroke risk factor management Therapy recommendations: outpatient OT, no PT f/u Disposition: home with outpatient PT  LV thrombus TTE showed nonmobile LV mural thrombus (approximately 1.2 x 0.5 cm) in region of akinesis involving the apical septal wall.  Discussed with cardiology on-call Dr. Juliane Lack, recommend cardiac MRI to confirm LV thrombus Cardiac MRI confirmed LV apical thrombus Continue heparin IV Loop recorder so far no A. fib 08/2019 TEE showed EF 50 to 55%, no regional wall motion abnormalities, no PFO Cardiology consult requested Hx of CAD s/p stents  History of stroke 02/2015, left arm weakness, difficulty walking, MRI showed right motor strip small infarct.  EF 60 to 65%, MRA head and neck right VA occlusion.  A1c 7.2, LDL 57, discharged with DAPT 08/2019 admitted for right superior cerebellum infarct with hemorrhagic transformation.  CT head and neck right VA chronic occlusion.  EF 65 to 70%.  TEE showed EF 50 to 55%, no regional wall motion abnormalities, no PFO.  Loop recorder placed.  LDL 75, A1c 8.7, discharged with DAPT and Lipitor. Followed with Dr. Leonie Man and Janett Billow NP at [facility], loop recorder negative for A. fib.  Diabetes HgbA1c 6.7, goal < 7.0 controlled CBG monitoring SSI On heart healthy and carb diet DM education and close PCP follow up  Hypertension Stable Long term BP goal  normotensive  Hyperlipidemia Home meds: Lipitor 80 and Zetia 10 LDL 49, goal < 70 Now on Lipitor 80 and Zetia 10 Continue statin at discharge  Other Stroke Risk Factors Obesity, BMI 33.25 Coronary artery disease/MI s/p stenting  Other Active Problems CKD IIIA, creatinine 1.34->1.34->1.28  Hospital day # [place], MSN, NP-C [facility] [phone]

## 2020-12-20 NOTE — Progress Notes (Signed)
ANTICOAGULATION CONSULT NOTE - Follow Up Consult  Pharmacy Consult for Warfarin and Enoxaparin bridge Indication:  LV thrombus  Allergies  Allergen Reactions   Other Other (See Comments)    Rybelsus ab pain at 3 mg, 7 mg n/v   Pravachol [Pravastatin Sodium] Other (See Comments)    Muscle aches    Patient Measurements:  Enoxaparin Dosing Weight: 111 kg  Vital Signs: Temp: 97.8 F (36.6 C) (09/20 0758) Temp Source: Oral (09/20 0758) BP: 110/72 (09/20 0758) Pulse Rate: 71 (09/20 0758)  Labs: Recent Labs    12/18/20 0322 12/18/20 1144 12/18/20 2052 12/19/20 0623 12/20/20 0337  HGB 14.7  --   --  13.4 12.7*  HCT 44.1  --   --  39.4 36.9*  PLT 175  --   --  145* 135*  LABPROT  --   --   --   --  14.7  INR  --   --   --   --  1.2  HEPARINUNFRC 0.47 0.65 0.61 0.62  --   CREATININE 1.34*  --   --  1.28* 1.25*    Estimated Creatinine Clearance: 79.9 mL/min (A) (by C-G formula based on SCr of 1.25 mg/dL (H)).  Assessment: 66 YOM who presented as a code stroke found to have a R M1 occlusion s/p thrombectomy. MRI showed moderately large R MCA infarct with petechial hemorrhage. 2D ECHO showed an LV thrombus and pharmacy consulted to start IV heparin for treatment.  Give moderately large acute stroke and petechial hemorrhage, IV heparin was dosed very conservatively WITHOUT boluses aiming for a conservative anti-Xa range.     Transitioned to Warfarin with Enoxaparin bridge on 9/19.  INR 1.2 after Warfarin 5 mg x 1 on 9/19. Would like slow rise to therapeutic INR 2-3, so no warfarin dosage change yet.   Goal of Therapy:  INR 2-3 Anti-Xa level 0.6-1 units/ml 4hrs after LMWH dose given Monitor platelets by anticoagulation protocol: Yes   Plan:  Discharging today. Warfarin to continue with 5 mg daily. Enoxaparin 110 mg (~1 mg/kg) SQ q12h until INR is >2. Discussed plan with patient and wife, including monitoring, bleeding precautions and potential drug-drug and drug-food  interactions.  Will need PT/INR later this week to monitor warfarin progress.   Arty Baumgartner, RPh 12/20/2020,1:01 PM

## 2020-12-20 NOTE — Evaluation (Signed)
Speech Language Pathology Evaluation Patient Details Name: Karl Morgan MRN: [id number] DOB: 13-Apr-1960 Today's Date: 12/20/2020 Time: 0263-7858 SLP Time Calculation (min) (ACUTE ONLY): 15 min  Problem List:  Patient Active Problem List   Diagnosis Date Noted   Stroke (cerebrum) ([place]) 12/17/2020   LV (left ventricular) mural thrombus    Hypertension associated with diabetes ([place]) 11/08/2020   Elevated TSH 05/10/2020   Gallbladder polyp 11/12/2019   Fatty liver 11/12/2019   Bilateral carotid artery stenosis 08/06/2019   Mitral valve insufficiency 08/06/2019   Uncontrolled type 2 diabetes mellitus with hyperglycemia ([place]) 08/06/2019   Cerebrovascular accident (CVA) due to embolism of cerebral artery ([place]) 08/06/2019   Intracranial atherosclerosis 08/06/2019   CKD (chronic kidney disease), stage IIIa 08/03/2019   Cerebellar infarct, right ([place]) - embolic, unknown source, s/p tPA 07/31/2019   Arthritis 10/21/2018   Sinusitis 10/21/2018   Diabetes mellitus type 2 in obese ([place]) 08/22/2017   Obesity (BMI 30-39.9) 08/22/2017   Herpes simplex type 1 infection 02/21/2015   Recurrent oral herpes simplex infection 02/21/2015   History of stroke 02/16/2015   Arteriosclerosis of coronary artery 10/07/2014   Heart attack ([place]) 10/07/2014   Hypotestosteronism 10/07/2014   B12 deficiency 12/18/2011   Hypothyroidism 05/04/2011   Annual physical exam 04/05/2011   Diabetes mellitus type 2, controlled, with complications ([place]) 85/05/7739   Essential hypertension 04/04/2011   Hyperlipidemia 04/04/2011   Hypogonadism male 04/04/2011   Alopecia 04/04/2011   Erectile dysfunction 04/04/2011   Past Medical History:  Past Medical History:  Diagnosis Date   Alopecia 04/04/2011   Diabetes mellitus    Erectile dysfunction 04/04/2011   Heart murmur    Hyperlipidemia 04/04/2011   Hypogonadism male 04/04/2011   Myocardial infarction [facility])    Stroke [facility])    Substance abuse ([place])    Type II or  unspecified type diabetes mellitus without mention of complication, uncontrolled 04/04/2011   Unspecified essential hypertension 04/04/2011   Past Surgical History:  Past Surgical History:  Procedure Laterality Date   BUBBLE STUDY  08/03/2019   Procedure: BUBBLE STUDY;  Surgeon: Fay Records, MD;  Location: [facility] ENDOSCOPY;  Service: Cardiovascular;;   CARDIAC CATHETERIZATION     IR ANGIO INTRA EXTRACRAN SEL COM CAROTID INNOMINATE UNI L MOD SED  12/17/2020   IR CT HEAD LTD  12/17/2020   IR PERCUTANEOUS ART THROMBECTOMY/INFUSION INTRACRANIAL INC DIAG ANGIO  12/17/2020   IR US GUIDE VASC ACCESS RIGHT  12/17/2020   LOOP RECORDER INSERTION N/A 08/03/2019   Procedure: LOOP RECORDER INSERTION;  Surgeon: Evans Lance, MD;  Location: [place] CV LAB;  Service: Cardiovascular;  Laterality: N/A;   RADIOLOGY WITH ANESTHESIA N/A 12/17/2020   Procedure: IR WITH ANESTHESIA;  Surgeon: Luanne Bras, MD;  Location: [place];  Service: Radiology;  Laterality: N/A;   TEE WITHOUT CARDIOVERSION N/A 08/03/2019   Procedure: TRANSESOPHAGEAL ECHOCARDIOGRAM (TEE);  Surgeon: Fay Records, MD;  Location: [facility] ENDOSCOPY;  Service: Cardiovascular;  Laterality: N/A;   HPI:  60 yo with + R MCA CVA  due to right M1 occlusion status post IR with revascularization, embolic pattern, secondary to LV thrombus. PMH DM, HTN. 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   Assessment / Plan / Recommendation Clinical Impression  Pt presents with a mild, high-level fluent aphasia with notable paraphasic errors during conversational speech (both phonemic and semantic).  Speech is clear without dysarthria. Basic verbal comprehension WNL.  Errors are occasionally recognized by pt; more often by his wife who was  present for assessment.  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 We agreed that it would be beneficial to see OP SLP for a more thorough aphasia assessment, particularly  given pt's level of responsibilities at work and reliance on communication skills.  Mr and Mrs Karl Morgan agree with the plan.    SLP Assessment  SLP Recommendation/Assessment: All further Speech Lanaguage Pathology  needs can be addressed in the next venue of care SLP Visit Diagnosis: Aphasia (R47.01)    Recommendations for follow up therapy are one component of a multi-disciplinary discharge planning process, led by the attending physician.  Recommendations may be updated based on patient status, additional functional criteria and insurance authorization.    Follow Up Recommendations  Outpatient SLP    Frequency and Duration           SLP Evaluation Cognition  Overall Cognitive Status: Within Functional Limits for tasks assessed Arousal/Alertness: Awake/alert Orientation Level: Oriented X4       Comprehension  Auditory Comprehension Overall Auditory Comprehension: Appears within functional limits for tasks assessed Reading Comprehension Reading Status: Within funtional limits    Expression Expression Primary Mode of Expression: Verbal Verbal Expression Overall Verbal Expression: Impaired Initiation: No impairment Level of Generative/Spontaneous Verbalization: Conversation Repetition: No impairment Naming: Impairment Responsive: 76-100% accurate Confrontation: Within functional limits Divergent: 75-100% accurate Verbal Errors: Semantic paraphasias;Phonemic paraphasias   Oral / Motor  Oral Motor/Sensory Function Overall Oral Motor/Sensory Function: Within functional limits Motor Speech Overall Motor Speech: Appears within functional limits for tasks assessed   GO           Rayel Santizo L. Tivis Ringer, [address] Office number [phone] Pager [phone]          Juan Quam Laurice 12/20/2020, 3:10 PM

## 2020-12-20 NOTE — Discharge Instructions (Addendum)
Information on my medicine - Coumadin   (Warfarin)  Why was Coumadin prescribed for you? Coumadin was prescribed for you because you have a blood clot or a medical condition that can cause an increased risk of forming blood clots. Blood clots can cause serious health problems by blocking the flow of blood to the heart, lung, or brain. Coumadin can prevent harmful blood clots from forming. As a reminder your indication for Coumadin is:  left ventricular mural thrombus (blood clot in a chamber of the heart) and for stroke.  What test will check on my response to Coumadin? While on Coumadin (warfarin) you will need to have an INR test regularly to ensure that your dose is keeping you in the desired range. The INR (international normalized ratio) number is calculated from the result of the laboratory test called prothrombin time (PT).  If an INR APPOINTMENT HAS NOT ALREADY BEEN MADE FOR YOU please schedule an appointment to have this lab work done by your health care provider within 7 days. Your INR goal is usually a number between:  2 to 3 or your provider may give you a more narrow range like 2-2.5.  Ask your health care provider during an office visit what your goal INR is.  What  do you need to  know  About  COUMADIN? Take Coumadin (warfarin) exactly as prescribed by your healthcare provider about the same time each day.  DO NOT stop taking without talking to the doctor who prescribed the medication.  Stopping without other blood clot prevention medication to take the place of Coumadin may increase your risk of developing a new clot or stroke.  Get refills before you run out.  What do you do if you miss a dose? If you miss a dose, take it as soon as you remember on the same day then continue your regularly scheduled regimen the next day.  Do not take two doses of Coumadin at the same time.  Important Safety Information A possible side effect of Coumadin (Warfarin) is an increased risk of  bleeding. You should call your healthcare provider right away if you experience any of the following: Bleeding from an injury or your nose that does not stop. Unusual colored urine (red or dark brown) or unusual colored stools (red or black). Unusual bruising for unknown reasons. A serious fall or if you hit your head (even if there is no bleeding).  Some foods or medicines interact with Coumadin (warfarin) and might alter your response to warfarin. To help avoid this: Eat a balanced diet, maintaining a consistent amount of Vitamin K. Notify your provider about major diet changes you plan to make. Avoid alcohol or limit your intake to 1 drink for women and 2 drinks for men per day. (1 drink is 5 oz. wine, 12 oz. beer, or 1.5 oz. liquor.)  Make sure that ANY health care provider who prescribes medication for you knows that you are taking Coumadin (warfarin).  Also make sure the healthcare provider who is monitoring your Coumadin knows when you have started a new medication including herbals and non-prescription products.  Coumadin (Warfarin)  Major Drug Interactions  Increased Warfarin Effect Decreased Warfarin Effect  Alcohol (large quantities) Antibiotics (esp. Septra/Bactrim, Flagyl, Cipro) Amiodarone (Cordarone) Aspirin (ASA) Cimetidine (Tagamet) Megestrol (Megace) NSAIDs (ibuprofen, naproxen, etc.) Piroxicam (Feldene) Propafenone (Rythmol SR) Propranolol (Inderal) Isoniazid (INH) Posaconazole (Noxafil) Barbiturates (Phenobarbital) Carbamazepine (Tegretol) Chlordiazepoxide (Librium) Cholestyramine (Questran) Griseofulvin Oral Contraceptives Rifampin Sucralfate (Carafate) Vitamin K   Coumadin (Warfarin) Major  Herbal Interactions  Increased Warfarin Effect Decreased Warfarin Effect  Garlic Ginseng Ginkgo biloba Coenzyme Q10 Green tea St. John's wort    Coumadin (Warfarin) FOOD Interactions  Eat a consistent number of servings per week of foods HIGH in Vitamin K (1  serving =  cup)  Collards (cooked, or boiled & drained) Kale (cooked, or boiled & drained) Mustard greens (cooked, or boiled & drained) Parsley *serving size only =  cup Spinach (cooked, or boiled & drained) Swiss chard (cooked, or boiled & drained) Turnip greens (cooked, or boiled & drained)  Eat a consistent number of servings per week of foods MEDIUM-HIGH in Vitamin K (1 serving = 1 cup)  Asparagus (cooked, or boiled & drained) Broccoli (cooked, boiled & drained, or raw & chopped) Brussel sprouts (cooked, or boiled & drained) *serving size only =  cup Lettuce, raw (green leaf, endive, romaine) Spinach, raw Turnip greens, raw & chopped   These websites have more information on Coumadin (warfarin):  FailFactory.se; VeganReport.com.au;

## 2020-12-21 ENCOUNTER — Telehealth: Payer: Self-pay

## 2020-12-21 NOTE — Telephone Encounter (Signed)
Transition Care Management Follow-up Telephone Call Date of discharge and from where: 12/20/20 -[place] How have you been since you were released from the hospital? I am having trouble with speech. Some words are slurred or difficult to say. Physically I am doing fine. Intermittent, low grade headache. Monitoring salt intake. Staying hydrated. Resting well. Feeling better overall. Denies blurred vision, numbness, weakness, fatigue, confusion and all other symptoms.  Any questions or concerns? No  Items Reviewed: Did the pt receive and understand the discharge instructions provided? Yes  Medications obtained and verified? Yes  Any new allergies since your discharge? No  Dietary orders reviewed? Heart healthy Do you have support at home? Yes   Home Care and Equipment/Supplies: Were home health services ordered? no  Functional Questionnaire: (I = Independent and D = Dependent) ADLs: I  Follow up appointments reviewed:  PCP Hospital f/u appt confirmed? Yes  Scheduled to see Dr. Caryl Bis on 12/23/21 @ 10:00. [place] [facility] f/u appt confirmed? Yes  Scheduled to see Neurology and Cardiology.   Are transportation arrangements needed? Yes  If their condition worsens, is the pt aware to call PCP or go to the Emergency Dept.? Yes Was the patient provided with contact information for the PCP's office or ED? yes Was to pt encouraged to call back with questions or concerns? Yes

## 2020-12-22 ENCOUNTER — Encounter: Payer: Self-pay | Admitting: *Deleted

## 2020-12-22 ENCOUNTER — Other Ambulatory Visit: Payer: Self-pay | Admitting: *Deleted

## 2020-12-22 DIAGNOSIS — Z79899 Other long term (current) drug therapy: Secondary | ICD-10-CM | POA: Diagnosis not present

## 2020-12-22 DIAGNOSIS — Z7901 Long term (current) use of anticoagulants: Secondary | ICD-10-CM | POA: Diagnosis not present

## 2020-12-22 DIAGNOSIS — Z5181 Encounter for therapeutic drug level monitoring: Secondary | ICD-10-CM | POA: Diagnosis not present

## 2020-12-22 NOTE — Patient Outreach (Signed)
[facility]) Care Management  12/22/2020  Karl Morgan Mar 03, 1961 [id number]   Transition of care call/case closure   Referral received: 12/19/20 Initial outreach:12/22/20 Insurance: [place] [employer]    Subjective: Initial successful telephone call to patient's preferred number in order to complete transition of care assessment; 2 HIPAA identifiers verified. Explained purpose of call and completed transition of care assessment.  Karl Morgan states that he is doing well , reports being physically strong, just having some problems with speech. He voiced having arranged outpatient speech therapy visit. He denies signs symptoms of stroke , no increase in weakness, vision or dizziness. He is tolerating diet, denies bowel or bladder problems. He discussed now being on Lovenox until coumadin level in proper range, able to self administer injection reports receiving education prior to discharge, denies signs/symptoms of bleeding had INR check on today at cardiology office.  Spouse is assisting with his recovery he anticipates returning to work in the next week.   Reviewed accessing the following [place] Benefits : He discussed  ongoing health issues and managing conditions, adherence to diet, Diabetes under control, weight loss and remaining active he says he  does not need a referral to one of the [place] chronic disease management programs.  He not have the hospital indemnity plan He uses  a [facility] outpatient pharmacy at [place],   Objective:  Karl Morgan was hospitalized at [facility] 9/17-9/20/22 for Stroke right MCA infarct, LV mural thrombus, s/p Interventional radiology revascularzation  Comorbidities include: CVA, CAD, with prior STEMI and PCI stents, Diabetes type 2, HTN , HLD GERD. He was discharged to home on 12/20/20  with outpatient speech therapy orders.    Assessment:  Patient voices good understanding of all discharge instructions.  See  transition of care flowsheet for assessment details.   Plan:  Reviewed hospital discharge diagnosis of Stroke, LV mural thrombus  and discharge treatment plan using hospital discharge instructions, assessing medication adherence, reviewing problems requiring provider notification, and discussing the importance of follow up with surgeon, primary care provider and/or specialists as directed.  Reviewed  healthy lifestyle program information to receive discounted premium for  2023   Step 1: Get  your annual physical  Step 2: Complete your health assessment  Step 3:Identify your current health status and complete the corresponding action step between April 02, 2020 and December 01, 2020.    No ongoing care management needs identified so will close case to [facility] Management services and route successful outreach letter with [place] Management pamphlet and 24 Hour Nurse Line Magnet to [place] Management clinical pool to be mailed to patient's home address.  Thanked patient for their services to [facility].  Joylene Draft, RN, BSN  [place] Management Coordinator  [phone]- Mobile [phone]- Toll Free Main Office

## 2020-12-22 NOTE — Telephone Encounter (Signed)
Reviewed

## 2020-12-23 ENCOUNTER — Ambulatory Visit: Payer: 59 | Admitting: Family Medicine

## 2020-12-23 ENCOUNTER — Encounter: Payer: Self-pay | Admitting: Family Medicine

## 2020-12-23 ENCOUNTER — Other Ambulatory Visit: Payer: Self-pay

## 2020-12-23 VITALS — BP 118/80 | HR 67 | Temp 98.0°F | Ht 72.0 in | Wt 237.8 lb

## 2020-12-23 DIAGNOSIS — E782 Mixed hyperlipidemia: Secondary | ICD-10-CM

## 2020-12-23 DIAGNOSIS — D696 Thrombocytopenia, unspecified: Secondary | ICD-10-CM | POA: Insufficient documentation

## 2020-12-23 DIAGNOSIS — E118 Type 2 diabetes mellitus with unspecified complications: Secondary | ICD-10-CM

## 2020-12-23 DIAGNOSIS — I6340 Cerebral infarction due to embolism of unspecified cerebral artery: Secondary | ICD-10-CM

## 2020-12-23 DIAGNOSIS — I513 Intracardiac thrombosis, not elsewhere classified: Secondary | ICD-10-CM | POA: Diagnosis not present

## 2020-12-23 LAB — CBC
HCT: 40.1 % (ref 39.0–52.0)
Hemoglobin: 13.3 g/dL (ref 13.0–17.0)
MCHC: 33.1 g/dL (ref 30.0–36.0)
MCV: 96.1 fl (ref 78.0–100.0)
Platelets: 153 10*3/uL (ref 150.0–400.0)
RBC: 4.17 Mil/uL — ABNORMAL LOW (ref 4.22–5.81)
RDW: 12.9 % (ref 11.5–15.5)
WBC: 11.2 10*3/uL — ABNORMAL HIGH (ref 4.0–10.5)

## 2020-12-23 MED ORDER — WARFARIN SODIUM 5 MG PO TABS: ORAL_TABLET | ORAL | 0 refills | Status: DC

## 2020-12-23 NOTE — Patient Instructions (Signed)
Nice to see you. Hematology should contact you to schedule an appointment. Please continue on the Coumadin.  You will take 2.5 mg (half a tablet) of Coumadin on Fridays and you will take 5 mg (1 tablet) of Coumadin on Sunday, Monday, Tuesday, Wednesday, Thursday, and Saturday. If you develop any bleeding that you cannot get stopped within 10 minutes or if you have excessive bleeding you need to be evaluated in the emergency department. If you have recurrent stroke symptoms please be evaluated right away.

## 2020-12-26 ENCOUNTER — Ambulatory Visit: Payer: 59 | Attending: Neurology | Admitting: Speech Pathology

## 2020-12-26 ENCOUNTER — Encounter: Payer: Self-pay | Admitting: Family Medicine

## 2020-12-26 ENCOUNTER — Other Ambulatory Visit: Payer: Self-pay

## 2020-12-26 ENCOUNTER — Telehealth: Payer: Self-pay | Admitting: Internal Medicine

## 2020-12-26 ENCOUNTER — Other Ambulatory Visit: Payer: Self-pay | Admitting: Family

## 2020-12-26 DIAGNOSIS — I513 Intracardiac thrombosis, not elsewhere classified: Secondary | ICD-10-CM

## 2020-12-26 DIAGNOSIS — R4701 Aphasia: Secondary | ICD-10-CM | POA: Insufficient documentation

## 2020-12-26 NOTE — Progress Notes (Signed)
Tommi Rumps, MD Phone: [phone]  Karl Morgan is a 60 y.o. male who presents today for follow-up.  Stroke: Patient presents for hospital follow-up.  He had a stroke on 12/17/2020.  He fell out of bed and had some difficulty speaking.  He became incoherent on the way to the hospital.  He underwent work-up in the emergency department that revealed an emergent large vessel occlusive finding at the right M1 segment.  He was transferred to [facility] and underwent interventional radiology revascularization.  The patient was found to have a left ventricle thrombus in the region of akinesis involving the apical septal wall.  This was felt to be the source of his stroke.  He was placed on IV heparin and then transitioned to Lovenox bridging to Coumadin.  His INR the day prior to our visit was 3.4.  He has been taking Coumadin 5 mg once daily.  The cardiology office advised him to discontinue his Lovenox though he wanted to hold off on that until he spoke with an MD.  He took half a dose of his Lovenox the day of this visit.  His LDL cholesterol was found to be 49.  His A1c was well controlled.  He has been continued on Lipitor 80 mg and Zetia 10 mg.  He has continued on Farxiga, glimepiride, Januvia, and metformin for his diabetes.  He has had a loop recorder in place that has not revealed atrial fibrillation.  He notes no recent chest pain or shortness of breath.  He notes his left-sided weakness has completely resolved.  His expressive aphasia has continued to improve.  He has occasionally had a little lightheadedness.  Social History   Tobacco Use  Smoking Status Never  Smokeless Tobacco Never    Current Outpatient Medications on File Prior to Visit  Medication Sig Dispense Refill   atorvastatin (LIPITOR) 80 MG tablet Take 1 tablet (80 mg total) by mouth daily. 90 tablet 0   dapagliflozin propanediol (FARXIGA) 10 MG TABS tablet Take 1 tablet (10 mg total) by mouth daily. 30 tablet 0    ezetimibe (ZETIA) 10 MG tablet Take 1 tablet (10 mg total) by mouth daily. 90 tablet 0   glimepiride (AMARYL) 2 MG tablet TAKE 1 TABLET (2 MG TOTAL) BY MOUTH 2 (TWO) TIMES DAILY WITH A MEAL. (Patient taking differently: Take 2 mg by mouth in the morning and at bedtime.) 180 tablet 3   levothyroxine (SYNTHROID) 75 MCG tablet Take 1 tablet (75 mcg total) by mouth daily. 90 tablet 3   lisinopril (ZESTRIL) 40 MG tablet TAKE 1 TABLET (40 MG TOTAL) BY MOUTH DAILY. 90 tablet 3   metFORMIN (GLUCOPHAGE) 850 MG tablet TAKE 1 TABLET BY MOUTH TWICE DAILY 180 tablet 3   metoprolol succinate (TOPROL-XL) 50 MG 24 hr tablet TAKE 1 TABLET BY MOUTH DAILY. TAKE WITH OR IMMEDIATELY FOLLOWING A MEAL. 90 tablet 3   pantoprazole (PROTONIX) 40 MG tablet Take 1 tablet (40 mg total) by mouth daily. 90 tablet 0   sitaGLIPtin (JANUVIA) 100 MG tablet TAKE 1 TABLET (100 MG TOTAL) BY MOUTH DAILY. 90 tablet 3   valACYclovir (VALTREX) 1000 MG tablet take 1 to 2 tablets by mouth every 12 hours for 2 to 3 days as needed for fever blisters 90 tablet 3   No current facility-administered medications on file prior to visit.     ROS see history of present illness  Objective  Physical Exam Vitals:   12/23/20 1005  BP: 118/80  Pulse:  67  Temp: 98 F (36.7 C)  SpO2: 99%    BP Readings from Last 3 Encounters:  12/23/20 118/80  12/20/20 110/72  12/17/20 120/75   Wt Readings from Last 3 Encounters:  12/23/20 237 lb 12.8 oz (107.9 kg)  12/17/20 245 lb 2.4 oz (111.2 kg)  11/08/20 240 lb (108.9 kg)    Physical Exam Constitutional:      General: He is not in acute distress.    Appearance: He is not diaphoretic.  Cardiovascular:     Rate and Rhythm: Normal rate and regular rhythm.     Heart sounds: Normal heart sounds.  Pulmonary:     Effort: Pulmonary effort is normal.     Breath sounds: Normal breath sounds.  Musculoskeletal:     Right lower leg: No edema.     Left lower leg: No edema.  Skin:    General: Skin is  warm and dry.  Neurological:     Mental Status: He is alert.     Comments: EOMI, PERRL, opens and closes eyes adequately, V1 through V3 intact light touch sensation bilateral face, hearing intact to finger rub, shoulder shrug intact, 5/5 strength in bilateral biceps, triceps, grip, quads, hamstrings, plantar and dorsiflexion, sensation to light touch intact in bilateral UE and LE, normal gait     Assessment/Plan: Please see individual problem list.  Problem List Items Addressed This Visit     Diabetes mellitus type 2, controlled, with complications ([place]) (Chronic)    Well-controlled while in the hospital.  We will continue Farxiga 10 mg once daily, glimepiride 2 mg twice daily with meals, Januvia 100 mg daily, and metformin 850 mg twice daily.      Hyperlipidemia (Chronic)    Recent LDL well controlled.  He will continue Lipitor 80 mg once daily and Zetia 10 mg once daily.      Relevant Medications   warfarin (COUMADIN) 5 MG tablet   Cerebrovascular accident (CVA) due to embolism of cerebral artery [facility])    Patient with recent stroke likely related to the mural thrombus that was found on imaging.  Certainly this is the cause of this current stroke though this is reportedly his third stroke in several years.  I wonder if there could be some underlying coagulopathy that is playing a role as his other risk factors seem to be well controlled.  He will see neurology as planned for follow-up.  He will see cardiology as planned given his mural thrombus.  We will refer to hematology to help determine if there is any underlying coagulopathy playing a role.  He will remain on his Coumadin.  Given his elevated INR we will have him take Coumadin 2.5 mg on Friday and 5 mg the other days of the week.  He will contact his cardiology office for follow-up on his INR which should be done 1 week after his most recent INR.  If they have any trouble with this they will let us know.  We did discuss the risk of  bleeding with an elevated INR as well as the risk of him throwing further clots if his INR got too low.  Advised seeking medical attention for any excessive bleeding or bleeding that he is not able to get stopped quickly.  Advised to seek medical attention immediately for any recurrent stroke symptoms.      Relevant Medications   warfarin (COUMADIN) 5 MG tablet   Left ventricular apical thrombus - Primary    He will continue his  Coumadin though given his elevated INR we will decrease the Coumadin to 2.5 mg on Fridays and continue 5 mg the other days of the week.  He will have this followed up in his cardiology office.      Relevant Medications   warfarin (COUMADIN) 5 MG tablet   Other Relevant Orders   Ambulatory referral to Hematology / Oncology   Thrombocytopenia [facility] [place])   Relevant Orders   CBC (Completed)    Return in about 3 months (around 03/24/2021) for with PCP.  This visit occurred during the SARS-CoV-2 public health emergency.  Safety protocols were in place, including screening questions prior to the visit, additional usage of staff PPE, and extensive cleaning of exam room while observing appropriate contact time as indicated for disinfecting solutions.    Tommi Rumps, MD [place]

## 2020-12-26 NOTE — Telephone Encounter (Signed)
Patient saw Dr Caryl Bis last week. He is not sure how much warfarin (COUMADIN) 5 MG tablet he should be taking now. Also, patient said that Dr Caryl Bis wanted him to see a hemoglobin doctor.

## 2020-12-26 NOTE — Assessment & Plan Note (Addendum)
Well-controlled while in the hospital.  We will continue Farxiga 10 mg once daily, glimepiride 2 mg twice daily with meals, Januvia 100 mg daily, and metformin 850 mg twice daily.

## 2020-12-26 NOTE — Patient Instructions (Signed)
Tips for Talking with People who have Aphasia  Say one thing at a time Don't  rush - slow down, be patient Talk face to face Reduce background noise Relax - be natural Use pen and paper Write down key words Draw diagrams or pictures Don't pretend you understand Ask what helps Recap - check you both understand   Aphasia does not affect intelligence, only language. The person with aphasia can still: make decisions, have opinions, and socialize.   Describing words  What group does it belong to?  What do I use it for?  Where can I find it?  What does it LOOK like?  What other words go with it?  What is the 1st sound of the word?   Many Ways to Communicate  Describe it Write it Draw it Gesture it Use related words  [place] with Dr. Leo Rod at [facility] - email [email or web address]  TalkPath Therapy app by [place]ery [place] on [facility] website National Aphasia Association - naa.aphasia.org Aphasia Recovery Connection - aphasiarecoveryconnection.org Tactus therapy apps Constant Therapy  Watch: Patience Listening and Communicating with Aphasia Patients on YouTube [email or web address]

## 2020-12-26 NOTE — Assessment & Plan Note (Signed)
Recent LDL well controlled.  He will continue Lipitor 80 mg once daily and Zetia 10 mg once daily.

## 2020-12-26 NOTE — Assessment & Plan Note (Addendum)
Patient with recent stroke likely related to the mural thrombus that was found on imaging.  Certainly this is the cause of this current stroke though this is reportedly his third stroke in several years.  I wonder if there could be some underlying coagulopathy that is playing a role as his other risk factors seem to be well controlled.  He will see neurology as planned for follow-up.  He will see cardiology as planned given his mural thrombus.  We will refer to hematology to help determine if there is any underlying coagulopathy playing a role.  He will remain on his Coumadin.  Given his elevated INR we will have him take Coumadin 2.5 mg on Friday and 5 mg the other days of the week.  He will contact his cardiology office for follow-up on his INR which should be done 1 week after his most recent INR.  If they have any trouble with this they will let us know.  We did discuss the risk of bleeding with an elevated INR as well as the risk of him throwing further clots if his INR got too low.  Advised seeking medical attention for any excessive bleeding or bleeding that he is not able to get stopped quickly.  Advised to seek medical attention immediately for any recurrent stroke symptoms.

## 2020-12-26 NOTE — Assessment & Plan Note (Signed)
He will continue his Coumadin though given his elevated INR we will decrease the Coumadin to 2.5 mg on Fridays and continue 5 mg the other days of the week.  He will have this followed up in his cardiology office.

## 2020-12-27 NOTE — Therapy (Signed)
[place] MAIN [facility] SERVICES [address] [place], [place], [address] Phone: [phone]   Fax:  [phone]  Speech Language Pathology Evaluation  Patient Details  Name: Karl Morgan MRN: [id number] Date of Birth: Sep 26, 1960 Referring Provider (SLP): Rosalin Hawking, MD   Encounter Date: 12/26/2020   End of Session - 12/27/20 1301     Visit Number 1    Number of Visits 25    Date for SLP Re-Evaluation 03/26/21    Authorization - Visit Number 1    Progress Note Due on Visit 10    SLP Start Time 1600    SLP Stop Time  1700    SLP Time Calculation (min) 60 min    Activity Tolerance Patient tolerated treatment well             Past Medical History:  Diagnosis Date   Alopecia 04/04/2011   Diabetes mellitus    Erectile dysfunction 04/04/2011   Heart attack ([place]) 10/07/2014   Overview:  Status post STEMI, anterior myocardial infarction status post DES stent x 2 to the LAD, mid and distal.    Heart murmur    Hyperlipidemia 04/04/2011   Hypogonadism male 04/04/2011   Myocardial infarction [facility])    Stroke [facility])    Substance abuse ([place])    Type II or unspecified type diabetes mellitus without mention of complication, uncontrolled 04/04/2011   Unspecified essential hypertension 04/04/2011    Past Surgical History:  Procedure Laterality Date   BUBBLE STUDY  08/03/2019   Procedure: BUBBLE STUDY;  Surgeon: Fay Records, MD;  Location[facility] ENDOSCOPY;  Service: Cardiovascular;;   CARDIAC CATHETERIZATION     IR ANGIO INTRA EXTRACRAN SEL COM CAROTID INNOMINATE UNI L MOD SED  12/17/2020   IR CT HEAD LTD  12/17/2020   IR PERCUTANEOUS ART THROMBECTOMY/INFUSION INTRACRANIAL INC DIAG ANGIO  12/17/2020   IR US GUIDE VASC ACCESS RIGHT  12/17/2020   LOOP RECORDER INSERTION N/A 08/03/2019   Procedure: LOOP RECORDER INSERTION;  Surgeon: Evans Lance, MD;  Location: [place] CV LAB;  Service: Cardiovascular;  Laterality: N/A;   RADIOLOGY WITH ANESTHESIA N/A  12/17/2020   Procedure: IR WITH ANESTHESIA;  Surgeon: Luanne Bras, MD;  Location: [place];  Service: Radiology;  Laterality: N/A;   TEE WITHOUT CARDIOVERSION N/A 08/03/2019   Procedure: TRANSESOPHAGEAL ECHOCARDIOGRAM (TEE);  Surgeon: Fay Records, MD;  Location: [place];  Service: Cardiovascular;  Laterality: N/A;    There were no vitals filed for this visit.       SLP Evaluation [facility] - 12/27/20 1245       SLP Visit Information   SLP Received On 12/26/20    Referring Provider (SLP) Rosalin Hawking, MD    Onset Date 12/17/2020    Medical Diagnosis CVA      Subjective   Subjective Returned to work today    Patient/Family Stated Goal "To be as... for my vocabulary... to be as good as it was."      General Information   HPI Patient is a 60 year old male admitted at [facility] 12/17/20 s/p R MCA CVA due to M1 occlusion, s/p IR with revascularization, embolic pattern, secondary to LV thrombus. PMH DM, HTN. 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 Evaluated by SLP during acute stay and found to have mild, high-level fluent aphasia.    Behavioral/Cognition alert, pleasant, cooperative    Mobility Status ambulated unassisted      Balance Screen   Has the  patient fallen in the past 6 months No    Has the patient had a decrease in activity level because of a fear of falling?  No    Is the patient reluctant to leave their home because of a fear of falling?  No      Prior Functional Status   Cognitive/Linguistic Baseline Within functional limits    Type of Home House     Lives With Spouse    Available Support Family    Vocation Full time Publishing copy at [employer]   Overall Cognitive Status Within Functional Limits for tasks assessed      Auditory Comprehension   Overall Auditory Comprehension Impaired    Yes/No Questions Impaired    Complex Questions 50-74% accurate   4/6   Commands Impaired    Complex Commands 75-100% accurate   90%   Conversation  Moderately complex    Other Conversation Comments needs repetition of more complex instructions    EffectiveTechniques Extra processing time;Repetition;Stressing words;Visual/Gestural cues      Visual Recognition/Discrimination   Discrimination Within Function Limits      Reading Comprehension   Reading Status Within funtional limits   comprehension intact for mod complex paragraph level but requires extended time; suspect deficits with complex, will assess further     Expression   Primary Mode of Expression Verbal      Verbal Expression   Overall Verbal Expression Impaired    Initiation No impairment    Automatic Speech Name;Social Response;Counting;Day of week    Level of Generative/Spontaneous Verbalization Conversation    Repetition Impaired    Level of Impairment Sentence level   "He picks up the coffee table on the coffee table." (unaware)   Naming Impairment    Responsive 51-75% accurate   "what do you do with soap?" : "brush teeth"   Confrontation 75-100% accurate   12/15, ( 2 additional paraphasias which were self-corrected)   Convergent Not tested    Verbal Errors Semantic paraphasias;Phonemic paraphasias;Perseveration;Aware of errors;Not aware of errors    Pragmatics No impairment    Effective Techniques Phonemic cues;Semantic cues    Non-Verbal Means of Communication Not applicable      Written Expression   Dominant Hand Right    Written Expression Exceptions to [facility]    Copy Ability Sentence    Dictation Ability Phrase;Sentence    Self Formulation Ability Phrase    Overall Writen Expression Extended time, written paraphasias, noun/verb phrases vs complete sentences      Oral Motor/Sensory Function   Overall Oral Motor/Sensory Function Appears within functional limits for tasks assessed      Motor Speech   Overall Motor Speech Appears within functional limits for tasks assessed      Standardized Assessments   Standardized Assessments  [place] Diagnostic Apashia  Examiniation-3rd              The [facility] Diagnostic Aphasia Administration (Short Form) Score Summary   Domain Raw Score Percentile  FLUENCY Phrase length Melodic Line Grammatical Form  7/7 7/7 7/7  100 100 100  CONVERSATION/EXPOSITORY SPEECH Simple Social Responses  7/7  100  AUDITORY COMPREHENSION Basic Word Discrimination Commands Complex Ideational Material  15/16 9/10 4/6  60 70 50  ARTICULATION Articulatory Agility  7/7  100  RECITATION Automatized Sentences  4/4  100  REPETITION Words Sentences  5/5 1/2  100 60  NAMING Responsive Naming [place] Naming Test Special Categories  6/10 12/15  12/12  40 80 100  PARAPHASIA Rating from Speech Profile  5/7  50  READING Matching Cases and Scripts Number Matching Picture-Word Matching Oral Word Reading Oral Sentence Reading Oral Sentence Comprehension Sentence/Paragraph Comprehension  4/4 4/4 4/4 15/15 5/5 3/3 4/4  100 100 100 100 100 100 100  WRITING Form Letter Choice Motor Facility Primer Words Regular Phonics Common Irregular Words Written Picture Naming Narrative Writing  14/14 21/21 14/14  4/4 2/2 3/3 4/4 6/11             100            100            100            100            100            100            100             40       SLP Education - 12/27/20 1300     Education Details tips for talking with aphasia, pacing, energy conservation    Person(s) Educated Patient    Methods Explanation;Handout    Comprehension Verbalized understanding;Need further instruction              SLP Short Term Goals - 12/27/20 1322       SLP SHORT TERM GOAL #1   Title Patient will use compensations for aphasia in 10 minute mod-complex conversation with rare min cues.    Time 10    Period --   sessions   Status New      SLP SHORT TERM GOAL #2   Title Patient will ID verbal errors and attempt correction >90% of the time.    Time 10    Period  --   sessions   Status New      SLP SHORT TERM GOAL #3   Title Pt will request repeats of verbal information when auditory breakdowns occur, >90% of the time.    Time 10    Period --   sessions   Status New      SLP SHORT TERM GOAL #4   Title Pt will complete further assessment of reading comprehension.    Time 10    Period --   sessions   Status New              SLP Long Term Goals - 12/27/20 1331       SLP LONG TERM GOAL #1   Title Pt will use compensations for aphasia in 20 minute complex conversation, repairing breakdowns as necessary with minimal listener cues (facial expression).    Time 12    Period Weeks    Status New    Target Date 03/26/21      SLP LONG TERM GOAL #2   Title Pt will recall >90% of key details from 20 minute presentation, using strategies/accessibility features for comprehension (note-taking, speech-to-text).    Time 12    Period Weeks    Status New    Target Date 03/26/21      SLP LONG TERM GOAL #3   Title Pt will demonstrate reading comprehension for complex multiparagraph material >90% with use of compensations allowed (text-to-speech).    Time 12    Period Weeks    Status New    Target Date 03/26/21      SLP LONG TERM  GOAL #4   Title Pt will generate typed or written responses to workplace scenarios (emails, policies, etc) >56% accuracy within a reasonable amount of time, with use of compensations/accessibility features.    Time 12    Period Weeks    Status New    Target Date 03/26/21      SLP LONG TERM GOAL #5   Title Pt will report fewer communication breakdowns than prior to ST    Baseline ~15 per day    Time 12    Period Weeks    Status New    Target Date 03/26/21              Plan - 12/27/20 1301     Clinical Impression Statement Karl Morgan presents with mild, high-level fluent aphasia with expressive > receptive deficits. Subtype is most consistent with anomic, however patient does have mild deficits in auditory  comprehension. Speech is fluent, with notable paraphasias but more often hesitations and restarts/rephrasing due to wordfinding difficulty. Patient is largely aware of verbal errors and attempts self-correction or rephrasing, but was unaware of errors ~15% of the time. Written expression is impaired at sentence level; pt generated primarily noun-verb phrases. Suspect deficits in reading comprehension with complex material; will further assess. 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 He appears highly motivated for improvements. I recommend skilled ST to improve language for verbal/written communication, and auditory/reading comprehension, to meet demands of workplace and social/community activities.    Speech Therapy Frequency --   1-2 times per week   Duration 12 weeks    Treatment/Interventions Language facilitation;Environmental controls;Cueing hierarchy;SLP instruction and feedback;Compensatory techniques;Functional tasks;Compensatory strategies;Internal/external aids;Multimodal communcation approach;Patient/family education    Potential to Achieve Goals Good    SLP Home Exercise Plan provided    Consulted and Agree with Plan of Care Patient             Patient will benefit from skilled therapeutic intervention in order to improve the following deficits and impairments:   Aphasia    Problem List Patient Active Problem List   Diagnosis Date Noted   Thrombocytopenia ([place]) 12/23/2020   Left ventricular apical thrombus    Hypertension associated with diabetes ([place]) 11/08/2020   Elevated TSH 05/10/2020   Gallbladder polyp 11/12/2019   Fatty liver 11/12/2019   Bilateral carotid artery stenosis 08/06/2019   Mitral valve insufficiency 08/06/2019   Cerebrovascular accident (CVA) due to embolism of cerebral artery ([place]) 08/06/2019   Intracranial atherosclerosis 08/06/2019   CKD (chronic kidney disease), stage IIIa  08/03/2019   Arthritis 10/21/2018   Obesity (BMI 30-39.9) 08/22/2017   Herpes simplex type 1 infection 02/21/2015   Recurrent oral herpes simplex infection 02/21/2015   History of stroke 02/16/2015   Arteriosclerosis of coronary artery 10/07/2014   Hypotestosteronism 10/07/2014   B12 deficiency 12/18/2011   Hypothyroidism 05/04/2011   Annual physical exam 04/05/2011   Diabetes mellitus type 2, controlled, with complications ([place]) 25/63/8937   Essential hypertension 04/04/2011   Hyperlipidemia 04/04/2011   Hypogonadism male 04/04/2011   Alopecia 04/04/2011   Erectile dysfunction 04/04/2011   Deneise Lever, [place], CCC-SLP Speech-Language Pathologist  Aliene Altes 12/27/2020, 1:33 PM  [place] MAIN [place] [address] [place], [place], [address] Phone: [phone]   Fax:  [phone]  Name: Karl Morgan MRN: [id number] Date of Birth: 05-05-1960

## 2020-12-27 NOTE — Telephone Encounter (Signed)
I called and spoke with the patient and he stated he did get his appointment with the hematologist. I reiterated the instructions to the patient for his coumadin regimen and he understood.  Henreitta Spittler,cma

## 2020-12-27 NOTE — Telephone Encounter (Signed)
Patient calling back in to check on status of this message.   Please advise

## 2020-12-28 ENCOUNTER — Other Ambulatory Visit: Payer: Self-pay | Admitting: Family Medicine

## 2020-12-28 ENCOUNTER — Other Ambulatory Visit (HOSPITAL_COMMUNITY): Payer: Self-pay

## 2020-12-28 DIAGNOSIS — D72829 Elevated white blood cell count, unspecified: Secondary | ICD-10-CM

## 2020-12-29 ENCOUNTER — Other Ambulatory Visit: Payer: Self-pay

## 2020-12-29 DIAGNOSIS — Z7901 Long term (current) use of anticoagulants: Secondary | ICD-10-CM | POA: Diagnosis not present

## 2020-12-29 DIAGNOSIS — D72829 Elevated white blood cell count, unspecified: Secondary | ICD-10-CM

## 2020-12-30 ENCOUNTER — Emergency Department
Admission: EM | Admit: 2020-12-30 | Discharge: 2020-12-31 | Disposition: A | Discharge status: Other Acute Inpatient Hospital | Payer: 59 | Attending: Emergency Medicine | Admitting: Emergency Medicine

## 2020-12-30 ENCOUNTER — Other Ambulatory Visit: Payer: Self-pay

## 2020-12-30 ENCOUNTER — Emergency Department: Payer: 59

## 2020-12-30 DIAGNOSIS — I6523 Occlusion and stenosis of bilateral carotid arteries: Secondary | ICD-10-CM | POA: Diagnosis not present

## 2020-12-30 DIAGNOSIS — E1122 Type 2 diabetes mellitus with diabetic chronic kidney disease: Secondary | ICD-10-CM | POA: Insufficient documentation

## 2020-12-30 DIAGNOSIS — N1831 Chronic kidney disease, stage 3a: Secondary | ICD-10-CM | POA: Diagnosis not present

## 2020-12-30 DIAGNOSIS — Z79899 Other long term (current) drug therapy: Secondary | ICD-10-CM | POA: Insufficient documentation

## 2020-12-30 DIAGNOSIS — I629 Nontraumatic intracranial hemorrhage, unspecified: Secondary | ICD-10-CM | POA: Diagnosis not present

## 2020-12-30 DIAGNOSIS — R791 Abnormal coagulation profile: Secondary | ICD-10-CM | POA: Insufficient documentation

## 2020-12-30 DIAGNOSIS — Z5181 Encounter for therapeutic drug level monitoring: Secondary | ICD-10-CM | POA: Diagnosis not present

## 2020-12-30 DIAGNOSIS — R300 Dysuria: Secondary | ICD-10-CM | POA: Insufficient documentation

## 2020-12-30 DIAGNOSIS — Z7901 Long term (current) use of anticoagulants: Secondary | ICD-10-CM | POA: Insufficient documentation

## 2020-12-30 DIAGNOSIS — E039 Hypothyroidism, unspecified: Secondary | ICD-10-CM | POA: Diagnosis not present

## 2020-12-30 DIAGNOSIS — I129 Hypertensive chronic kidney disease with stage 1 through stage 4 chronic kidney disease, or unspecified chronic kidney disease: Secondary | ICD-10-CM | POA: Insufficient documentation

## 2020-12-30 DIAGNOSIS — Z7984 Long term (current) use of oral hypoglycemic drugs: Secondary | ICD-10-CM | POA: Insufficient documentation

## 2020-12-30 DIAGNOSIS — Z20822 Contact with and (suspected) exposure to covid-19: Secondary | ICD-10-CM | POA: Diagnosis not present

## 2020-12-30 DIAGNOSIS — R519 Headache, unspecified: Secondary | ICD-10-CM | POA: Diagnosis not present

## 2020-12-30 LAB — PROTIME-INR
INR: 6.6 (ref 0.8–1.2)
INR: 1.2 (ref 0.8–1.2)
Prothrombin Time: 57.5 seconds — ABNORMAL HIGH (ref 11.4–15.2)
Prothrombin Time: 15.4 seconds — ABNORMAL HIGH (ref 11.4–15.2)

## 2020-12-30 LAB — CBC WITH DIFFERENTIAL/PLATELET
Abs Immature Granulocytes: 0.05 10*3/uL (ref 0.00–0.07)
Basophils Absolute: 0 10*3/uL (ref 0.0–0.1)
Basophils Relative: 0 %
Eosinophils Absolute: 0.1 10*3/uL (ref 0.0–0.5)
Eosinophils Relative: 2 %
HCT: 37.8 % — ABNORMAL LOW (ref 39.0–52.0)
Hemoglobin: 13 g/dL (ref 13.0–17.0)
Immature Granulocytes: 1 %
Lymphocytes Relative: 14 %
Lymphs Abs: 1.3 10*3/uL (ref 0.7–4.0)
MCH: 32.7 pg (ref 26.0–34.0)
MCHC: 34.4 g/dL (ref 30.0–36.0)
MCV: 95 fL (ref 80.0–100.0)
Monocytes Absolute: 0.6 10*3/uL (ref 0.1–1.0)
Monocytes Relative: 7 %
Neutro Abs: 7.4 10*3/uL (ref 1.7–7.7)
Neutrophils Relative %: 76 %
Platelets: 230 10*3/uL (ref 150–400)
RBC: 3.98 MIL/uL — ABNORMAL LOW (ref 4.22–5.81)
RDW: 11.8 % (ref 11.5–15.5)
WBC: 9.6 10*3/uL (ref 4.0–10.5)
nRBC: 0 % (ref 0.0–0.2)

## 2020-12-30 LAB — COMPREHENSIVE METABOLIC PANEL
ALT: 23 U/L (ref 0–44)
AST: 22 U/L (ref 15–41)
Albumin: 3.5 g/dL (ref 3.5–5.0)
Alkaline Phosphatase: 51 U/L (ref 38–126)
Anion gap: 8 (ref 5–15)
BUN: 21 mg/dL (ref 8–23)
CO2: 26 mmol/L (ref 22–32)
Calcium: 8.7 mg/dL — ABNORMAL LOW (ref 8.9–10.3)
Chloride: 104 mmol/L (ref 98–111)
Creatinine, Ser: 1.43 mg/dL — ABNORMAL HIGH (ref 0.61–1.24)
GFR, Estimated: 56 mL/min — ABNORMAL LOW (ref >60)
Glucose, Bld: 139 mg/dL — ABNORMAL HIGH (ref 70–99)
Potassium: 4.7 mmol/L (ref 3.5–5.1)
Sodium: 138 mmol/L (ref 135–145)
Total Bilirubin: 0.7 mg/dL (ref 0.3–1.2)
Total Protein: 6.5 g/dL (ref 6.5–8.1)

## 2020-12-30 LAB — TYPE AND SCREEN
ABO/RH(D): B POS
Antibody Screen: NEGATIVE

## 2020-12-30 LAB — APTT: aPTT: 80 seconds — ABNORMAL HIGH (ref 24–36)

## 2020-12-30 MED ORDER — CLEVIDIPINE BUTYRATE 0.5 MG/ML IV EMUL
0.0000 mg/h | INTRAVENOUS | Status: DC
  Filled 2020-12-30: qty 50

## 2020-12-30 MED ORDER — PROTHROMBIN COMPLEX CONC HUMAN 500 UNITS IV KIT
2109.0000 [IU] | PACK | Status: AC
  Administered 2020-12-30: 2109 [IU] via INTRAVENOUS
  Filled 2020-12-30: qty 2109

## 2020-12-30 MED ORDER — VITAMIN K1 10 MG/ML IJ SOLN
10.0000 mg | INTRAVENOUS | Status: AC
  Administered 2020-12-30: 10 mg via INTRAVENOUS
  Filled 2020-12-30: qty 1

## 2020-12-30 MED ORDER — PROTHROMBIN COMPLEX CONC HUMAN 500 UNITS IV KIT
2000.0000 [IU] | PACK | Status: DC
  Filled 2020-12-30: qty 2000

## 2020-12-30 NOTE — ED Notes (Signed)
Pt and family asking for something to eat and drink as well as an update. MD was made aware that pt was told my Neuro that they would be getting a follow up scan q6. MD made aware. Pt passed bedside swallow and given water and crackers.

## 2020-12-30 NOTE — ED Notes (Signed)
Pt to ct with ct tech 

## 2020-12-30 NOTE — ED Triage Notes (Signed)
Pt here from Dr. Buford Dresser office due to his abnormal labwork. Pt currently has a clot in his heart. Pt had a stroke 4 days ago and has some residual aphasia and also c/o a possible UTI.

## 2020-12-30 NOTE — Consult Note (Signed)
NEUROLOGY CONSULTATION NOTE   Date of service: December 30, 2020 Patient Name: Karl Morgan MRN:  [id number] DOB:  31-Mar-1961 Reason for consult: ICH Requesting physician: Dr. Delman Kitten _ _ _   _ __   _ __ _ _  __ __   _ __   __ _  History of Present Illness   This is a 60 yo L-handed gentleman with hx DM2, HTN, STEMI 22-Jun-2014, HL, recent admission to [facility] for acute ischemic R MCA stroke s/p thrombectomy for R M1 occlusion. He initially presented to [facility] 12/17/20 with R MCA syndrome NIHSS = 17. He underwent thrombectomy for R M1 occlusion. He improved significantly and was discharged with only residual mild L facial droop and some mild aphasia. He was started on lovenox bridge to warfarin for anticoagulation for LV thrombus. He is currently taking warfarin 5mg  qhs. He presented today from his cardiologist's office where he was found to have an INR>10 and reported a headache. Head CT upon arrival to ED showed new small acute hemorrhages in the right frontal and temporal lobes in the region of his recent evolving R MCA territory stroke. Inferior R frontal lobe shows edema but there is no midline shift. Chronic R frontal and bilateral cerebellar infarcts. Imaging personally reviewed and discussed with radiologist. SBP 130s-140s since admission. Patient denies any new neurologic deficits compared to when he was discharged from the hospital and I agree his exam is stable.    ROS   Per HPI; all other systems reviewed and are negative  Past History   I have reviewed the following:  Past Medical History:  Diagnosis Date   Alopecia 04/04/2011   Diabetes mellitus    Erectile dysfunction 04/04/2011   Heart attack ([place]) 10/07/2014   Overview:  Status post STEMI, anterior myocardial infarction status post DES stent x 2 to the LAD, mid and distal.    Heart murmur    Hyperlipidemia 04/04/2011   Hypogonadism male 04/04/2011   Myocardial infarction [facility])    Stroke [facility])    Substance abuse ([place])    Type  II or unspecified type diabetes mellitus without mention of complication, uncontrolled 04/04/2011   Unspecified essential hypertension 04/04/2011   Past Surgical History:  Procedure Laterality Date   BUBBLE STUDY  08/03/2019   Procedure: BUBBLE STUDY;  Surgeon: Fay Records, MD;  Location: [facility] ENDOSCOPY;  Service: Cardiovascular;;   CARDIAC CATHETERIZATION     IR ANGIO INTRA EXTRACRAN SEL COM CAROTID INNOMINATE UNI L MOD SED  12/17/2020   IR CT HEAD LTD  12/17/2020   IR PERCUTANEOUS ART THROMBECTOMY/INFUSION INTRACRANIAL INC DIAG ANGIO  12/17/2020   IR US GUIDE VASC ACCESS RIGHT  12/17/2020   LOOP RECORDER INSERTION N/A 08/03/2019   Procedure: LOOP RECORDER INSERTION;  Surgeon: Evans Lance, MD;  Location: [place] CV LAB;  Service: Cardiovascular;  Laterality: N/A;   RADIOLOGY WITH ANESTHESIA N/A 12/17/2020   Procedure: IR WITH ANESTHESIA;  Surgeon: Luanne Bras, MD;  Location: [place];  Service: Radiology;  Laterality: N/A;   TEE WITHOUT CARDIOVERSION N/A 08/03/2019   Procedure: TRANSESOPHAGEAL ECHOCARDIOGRAM (TEE);  Surgeon: Fay Records, MD;  Location: [facility] ENDOSCOPY;  Service: Cardiovascular;  Laterality: N/A;   Family History  Problem Relation Age of Onset   Hypertension Father    Dementia Father        died age 25 in 2017-06-21   Memory loss Father    Emphysema Mother    COPD Mother  Thyroid disease Mother    Cancer Neg Hx    Diabetes Neg Hx    Heart disease Neg Hx    Stroke Neg Hx    Social History   Socioeconomic History   Marital status: Married    Spouse name: Not on file   Number of children: Not on file   Years of education: Not on file   Highest education level: Not on file  Occupational History   Occupation: OPERATIONS MANAGER of maintenance    Employer: [employer]  Tobacco Use   Smoking status: Never   Smokeless tobacco: Never  Vaping Use   Vaping Use: Never used  Substance and Sexual Activity   Alcohol use: No    Comment: former etoh abuse in 1980s    Drug  use: No   Sexual activity: Yes  Other Topics Concern   Not on file  Social History Narrative   Caffienated drinks-no   Seat belt use often-yes   Regular Exercise-no   Smoke alarm in the home-yes   Firearms/guns in the home-yes   History of physical abuse-no      Married 2 kids daughter and son    Oldest of siblings has 2 brothers and 1 sister    Never smoker    Works Physicist, medical and [employer]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       DPR wife Vickie             Social Determinants of Radio broadcast assistant Strain: Not on file  Food Insecurity: Not on file  Transportation Needs: Not on file  Physical Activity: Not on file  Stress: Not on file  Social Connections: Not on file   Allergies  Allergen Reactions   Other Other (See Comments)    Rybelsus ab pain at 3 mg, 7 mg n/v   Pravachol [Pravastatin Sodium] Other (See Comments)    Muscle aches    Medications        Current Facility-Administered Medications:    phytonadione (VITAMIN K) 10 mg in dextrose 5 % 50 mL IVPB, 10 mg, Intravenous, STAT, Kijuan Gallicchio, Patrecia Pour, MD   prothrombin complex conc human (KCENTRA) IVPB 2,000 Units, 2,000 Units, Intravenous, STAT, Derek Jack, MD  Current Outpatient Medications:    atorvastatin (LIPITOR) 80 MG tablet, Take 1 tablet (80 mg total) by mouth daily., Disp: 90 tablet, Rfl: 0   dapagliflozin propanediol (FARXIGA) 10 MG TABS tablet, Take 1 tablet (10 mg total) by mouth daily., Disp: 30 tablet, Rfl: 0   ezetimibe (ZETIA) 10 MG tablet, Take 1 tablet (10 mg total) by mouth daily., Disp: 90 tablet, Rfl: 0   glimepiride (AMARYL) 2 MG tablet, TAKE 1 TABLET (2 MG TOTAL) BY MOUTH 2 (TWO) TIMES DAILY WITH A MEAL. (Patient taking differently: Take 2 mg by mouth in the morning and at bedtime.), Disp: 180 tablet, Rfl: 3   levothyroxine (SYNTHROID) 75 MCG tablet, Take 1 tablet (75 mcg total) by mouth daily., Disp: 90 tablet, Rfl: 3    lisinopril (ZESTRIL) 40 MG tablet, TAKE 1 TABLET (40 MG TOTAL) BY MOUTH DAILY., Disp: 90 tablet, Rfl: 3   metFORMIN (GLUCOPHAGE) 850 MG tablet, TAKE 1 TABLET BY MOUTH TWICE DAILY, Disp: 180 tablet, Rfl: 3   metoprolol succinate (TOPROL-XL) 50 MG 24 hr tablet, TAKE 1 TABLET BY MOUTH DAILY. TAKE WITH OR IMMEDIATELY FOLLOWING A MEAL., Disp: 90 tablet, Rfl: 3   pantoprazole (PROTONIX) 40 MG  tablet, Take 1 tablet (40 mg total) by mouth daily., Disp: 90 tablet, Rfl: 0   sitaGLIPtin (JANUVIA) 100 MG tablet, TAKE 1 TABLET (100 MG TOTAL) BY MOUTH DAILY., Disp: 90 tablet, Rfl: 3   valACYclovir (VALTREX) 1000 MG tablet, take 1 to 2 tablets by mouth every 12 hours for 2 to 3 days as needed for fever blisters, Disp: 90 tablet, Rfl: 3   warfarin (COUMADIN) 5 MG tablet, Take 2.5 mg (half a tablet) by mouth on Fridays, take 5 mg (1 tablet) on Sunday, Monday, Tuesday, Wednesday, Thursday, and Saturday, Disp: 90 tablet, Rfl: 0  Vitals   Vitals:   12/30/20 1223 12/30/20 1258 12/30/20 1418  BP: (!) 177/88 (!) 161/81 (!) 145/80  Pulse: 77 65 64  Resp: 18 16 18   Temp: 98.2 F (36.8 C)    TempSrc: Oral    SpO2: 99% 97% 99%  Weight: 106.1 kg    Height: 6\' 1"  (1.854 m)       Body mass index is 30.87 kg/m.  Physical Exam   Physical Exam Gen: A&O x4, NAD HEENT: Atraumatic, normocephalic;mucous membranes moist; oropharynx clear, tongue without atrophy or fasciculations. Neck: Supple, trachea midline. Resp: CTAB, no w/r/r CV: RRR, no m/g/r; nml S1 and S2. 2+ symmetric peripheral pulses. Abd: soft/NT/ND; nabs x 4 quad Extrem: Nml bulk; no cyanosis, clubbing, or edema.  Neuro: *MS: A&O x4. Follows multi-step commands.  *Speech: fluid, nondysarthric, able to name and repeat but has v subtle word finding difficulty in conversation *CN:    I: Deferred   II,III: PERRLA, VFF by confrontation, optic discs unable to be visualized 2/2 pupillary constriction   III,IV,VI: EOMI w/o nystagmus, no ptosis   V:  Sensation intact from V1 to V3 to LT   VII: Eyelid closure was full.  L NLF flattening   VIII: Hearing intact to voice   IX,X: Voice normal, palate elevates symmetrically    XI: SCM/trap 5/5 bilat   XII: Tongue protrudes midline, no atrophy or fasciculations   *Motor:   Normal bulk.  No tremor, rigidity or bradykinesia. No pronator drift.    Strength: Dlt Bic Tri WrE WrF FgS Gr HF KnF KnE PlF DoF    Left 5 5 5 5 5 5 5 5 5 5 5 5     Right 5 5 5 5 5 5 5 5 5 5 5 5     *Sensory: Intact to light touch, pinprick, temperature vibration throughout. Symmetric. Propioception intact bilat.  No double-simultaneous extinction.  *Coordination:  Finger-to-nose, heel-to-shin, rapid alternating motions were intact. *Reflexes:  2+ and symmetric throughout without clonus; toes down-going bilat *Gait: deferred  NIHSS = 2 (facial droop, best language)   Premorbid mRS = 1  ICH score = 0   Labs   CBC:  Recent Labs  Lab 12/30/20 1300  WBC 9.6  NEUTROABS 7.4  HGB 13.0  HCT 37.8*  MCV 95.0  PLT 379    Basic Metabolic Panel:  Lab Results  Component Value Date   NA 138 12/30/2020   K 4.7 12/30/2020   CO2 26 12/30/2020   GLUCOSE 139 (H) 12/30/2020   BUN 21 12/30/2020   CREATININE 1.43 (H) 12/30/2020   CALCIUM 8.7 (L) 12/30/2020   GFRNONAA 56 (L) 12/30/2020   GFRAA >60 11/03/2019   Lipid Panel:  Lab Results  Component Value Date   LDLCALC 49 12/18/2020   HgbA1c:  Lab Results  Component Value Date   HGBA1C 6.7 (H) 12/18/2020   Urine Drug Screen:  Component Value Date/Time   LABOPIA NONE DETECTED 07/31/2019 2330   COCAINSCRNUR NONE DETECTED 07/31/2019 2330   LABBENZ NONE DETECTED 07/31/2019 2330   AMPHETMU NONE DETECTED 07/31/2019 2330   THCU NONE DETECTED 07/31/2019 2330   LABBARB NONE DETECTED 07/31/2019 2330    Alcohol Level     Component Value Date/Time   ETH <10 07/31/2019 1938     Impression   60 yo L-handed gentleman with hx DM2, HTN, STEMI 2016, HL, recent  admission to [facility] for acute ischemic R MCA stroke s/p thrombectomy for R M1 occlusion, on warfarin for LV thrombus, presents from cardiologist's office with headache and INR > 10 and was found to have multiple small areas of hemorrhage in the right frontal and temporal lobes. Plan d/w Dr. Leonie Man (stroke team), Dr. Leonel Ramsay (admitting MD for Neuro ICU), Dr. Letta Kocher (cardiology) and Dr. Jacqualine Code (ED).  Recommendations   - Transfer to [facility] via ambulance for admission to Neuro ICU under Dr. Roland Rack - Patient received 10mg  vit K and 2109U Eppie Gibson in ED at [facility] prior to transfer - Place STAT consult to pharmacy on arrival to [facility] for continued monitoring of INR reversal. Goal is to completely reverse his anticoagulation and wait 2-3 days before considering reintroduction (with heparin gtt or potentially DOAC). - Clevidipine for goal SBP <140 - SCDs for DVT prophylaxis - Head CT q 6 hrs assess stability of ICH - HOB elevated 30 degrees - Dx and plan discussed at length with patient and his wife who are in agreement.      Su Monks, MD [facility] [phone]   If 7pm- 7am, please page neurology on call as listed in [place].

## 2020-12-30 NOTE — ED Triage Notes (Signed)
Pt was sent for INR 10

## 2020-12-30 NOTE — ED Notes (Signed)
Critical INR 6.5 MD aware.

## 2020-12-30 NOTE — ED Provider Notes (Signed)
[facility] Emergency Department Provider Note   ____________________________________________   Event Date/Time   First MD Initiated Contact with Patient 12/30/20 1236     (approximate)  I have reviewed the triage vital signs and the nursing notes.   HISTORY  Chief Complaint Coagulation Disorder    HPI Karl Morgan is a 60 y.o. male history of recent embolic stroke currently on Coumadin for low ventricular thrombus.  He has been having difficulty getting his INR below 10 on 2 checks.  Held his Coumadin since Wednesday evening.  's cardiologist Dr. Call would referred him here.  Was seen evaluated by neurology in the hospital recently.  Reports he has had a very mild headache for about 4 to 5 days.  No known bleeding.  No new numbness swelling mass or tingling.  Does have some very mild word finding difficulty which she reports has been present since the stroke occurred.  Following with cardiology as well as neurology.  Past Medical History:  Diagnosis Date   Alopecia 04/04/2011   Diabetes mellitus    Erectile dysfunction 04/04/2011   Heart attack ([place]) 10/07/2014   Overview:  Status post STEMI, anterior myocardial infarction status post DES stent x 2 to the LAD, mid and distal.    Heart murmur    Hyperlipidemia 04/04/2011   Hypogonadism male 04/04/2011   Myocardial infarction [facility])    Stroke [facility])    Substance abuse ([place])    Type II or unspecified type diabetes mellitus without mention of complication, uncontrolled 04/04/2011   Unspecified essential hypertension 04/04/2011    Patient Active Problem List   Diagnosis Date Noted   Thrombocytopenia ([place]) 12/23/2020   Left ventricular apical thrombus    Hypertension associated with diabetes ([place]) 11/08/2020   Elevated TSH 05/10/2020   Gallbladder polyp 11/12/2019   Fatty liver 11/12/2019   Bilateral carotid artery stenosis 08/06/2019   Mitral valve insufficiency 08/06/2019   Cerebrovascular accident  (CVA) due to embolism of cerebral artery ([place]) 08/06/2019   Intracranial atherosclerosis 08/06/2019   CKD (chronic kidney disease), stage IIIa 08/03/2019   Arthritis 10/21/2018   Obesity (BMI 30-39.9) 08/22/2017   Herpes simplex type 1 infection 02/21/2015   Recurrent oral herpes simplex infection 02/21/2015   History of stroke 02/16/2015   Arteriosclerosis of coronary artery 10/07/2014   Hypotestosteronism 10/07/2014   B12 deficiency 12/18/2011   Hypothyroidism 05/04/2011   Annual physical exam 04/05/2011   Diabetes mellitus type 2, controlled, with complications ([place]) 82/99/3716   Essential hypertension 04/04/2011   Hyperlipidemia 04/04/2011   Hypogonadism male 04/04/2011   Alopecia 04/04/2011   Erectile dysfunction 04/04/2011    Past Surgical History:  Procedure Laterality Date   BUBBLE STUDY  08/03/2019   Procedure: BUBBLE STUDY;  Surgeon: Fay Records, MD;  Location: [facility] ENDOSCOPY;  Service: Cardiovascular;;   CARDIAC CATHETERIZATION     IR ANGIO INTRA EXTRACRAN SEL COM CAROTID INNOMINATE UNI L MOD SED  12/17/2020   IR CT HEAD LTD  12/17/2020   IR PERCUTANEOUS ART THROMBECTOMY/INFUSION INTRACRANIAL INC DIAG ANGIO  12/17/2020   IR US GUIDE VASC ACCESS RIGHT  12/17/2020   LOOP RECORDER INSERTION N/A 08/03/2019   Procedure: LOOP RECORDER INSERTION;  Surgeon: Evans Lance, MD;  Location: [facility] CV LAB;  Service: Cardiovascular;  Laterality: N/A;   RADIOLOGY WITH ANESTHESIA N/A 12/17/2020   Procedure: IR WITH ANESTHESIA;  Surgeon: Luanne Bras, MD;  Location: [facility];  Service: Radiology;  Laterality: N/A;   TEE WITHOUT  CARDIOVERSION N/A 08/03/2019   Procedure: TRANSESOPHAGEAL ECHOCARDIOGRAM (TEE);  Surgeon: Fay Records, MD;  Location: [facility];  Service: Cardiovascular;  Laterality: N/A;    Prior to Admission medications   Medication Sig Start Date End Date Taking? Authorizing Provider  atorvastatin (LIPITOR) 80 MG tablet Take 1 tablet (80 mg total) by mouth daily.  12/21/20  Yes Vonzella Nipple, NP  dapagliflozin propanediol (FARXIGA) 10 MG TABS tablet Take 1 tablet (10 mg total) by mouth daily. 12/21/20  Yes Vonzella Nipple, NP  ezetimibe (ZETIA) 10 MG tablet Take 1 tablet (10 mg total) by mouth daily. 12/21/20  Yes Vonzella Nipple, NP  glimepiride (AMARYL) 2 MG tablet TAKE 1 TABLET (2 MG TOTAL) BY MOUTH 2 (TWO) TIMES DAILY WITH A MEAL. Patient taking differently: Take 2 mg by mouth daily with breakfast. 05/10/20 05/10/21 Yes McLean-Scocuzza, Nino Glow, MD  levothyroxine (SYNTHROID) 75 MCG tablet Take 1 tablet (75 mcg total) by mouth daily. 12/20/20  Yes Vonzella Nipple, NP  lisinopril (ZESTRIL) 40 MG tablet TAKE 1 TABLET (40 MG TOTAL) BY MOUTH DAILY. 12/20/20 12/20/21 Yes Vonzella Nipple, NP  metFORMIN (GLUCOPHAGE) 850 MG tablet TAKE 1 TABLET BY MOUTH TWICE DAILY 12/20/20 12/20/21 Yes Vonzella Nipple, NP  metoprolol succinate (TOPROL-XL) 50 MG 24 hr tablet TAKE 1 TABLET BY MOUTH DAILY. TAKE WITH OR IMMEDIATELY FOLLOWING A MEAL. 12/20/20 12/20/21 Yes Vonzella Nipple, NP  pantoprazole (PROTONIX) 40 MG tablet Take 1 tablet (40 mg total) by mouth daily. 12/21/20  Yes Vonzella Nipple, NP  valACYclovir (VALTREX) 1000 MG tablet take 1 to 2 tablets by mouth every 12 hours for 2 to 3 days as needed for fever blisters 12/20/20  Yes Vonzella Nipple, NP  warfarin (COUMADIN) 5 MG tablet Take 2.5 mg (half a tablet) by mouth on Fridays, take 5 mg (1 tablet) on 2022/06/22, Monday, Tuesday, Wednesday, Thursday, and Saturday 12/23/20  Yes Leone Haven, MD  sitaGLIPtin (JANUVIA) 100 MG tablet TAKE 1 TABLET (100 MG TOTAL) BY MOUTH DAILY. Patient not taking: Reported on 12/30/2020 12/20/20 12/20/21  Vonzella Nipple, NP    Allergies Other and Pravachol [pravastatin sodium]  Family History  Problem Relation Age of Onset   Hypertension Father    Dementia Father        died age 58 in 06/21/17   Memory loss Father    Emphysema Mother    COPD Mother     Thyroid disease Mother    Cancer Neg Hx    Diabetes Neg Hx    Heart disease Neg Hx    Stroke Neg Hx     Social History Social History   Tobacco Use   Smoking status: Never   Smokeless tobacco: Never  Vaping Use   Vaping Use: Never used  Substance Use Topics   Alcohol use: No    Comment: former etoh abuse in 1980s    Drug use: No    Review of Systems Constitutional: No fever/chills Eyes: No visual changes. ENT: No sore throat. Cardiovascular: Denies chest pain. Respiratory: Denies shortness of breath. Gastrointestinal: No abdominal pain.   Genitourinary: Negative for dysuria. Musculoskeletal: Negative for back pain. Skin: Negative for rash. Neurological: Negative for areas of focal weakness or numbness.  Slight difficulty word finding.  Very mild headache for about 4 to 5 days    ____________________________________________   PHYSICAL EXAM:  VITAL SIGNS: ED Triage Vitals [12/30/20 1223]  Enc Vitals Group     BP (!) 177/88  Pulse Rate 77     Resp 18     Temp 98.2 F (36.8 C)     Temp Source Oral     SpO2 99 %     Weight 234 lb (106.1 kg)     Height 6\' 1"  (1.854 m)     Head Circumference      Peak Flow      Pain Score 0     Pain Loc      Pain Edu?      Excl. in [place]?     Constitutional: Alert and oriented. Well appearing and in no acute distress. Eyes: Conjunctivae are normal. Head: Atraumatic. Nose: No congestion/rhinnorhea. Mouth/Throat: Mucous membranes are moist. Neck: No stridor.  Cardiovascular: Normal rate, regular rhythm. Grossly normal heart sounds.  Good peripheral circulation. Respiratory: Normal respiratory effort.  No retractions. Lungs CTAB. Gastrointestinal: Soft and nontender. No distention. Musculoskeletal: No lower extremity tenderness nor edema. Neurologic:  Normal speech and language except occasionally has a just a slight word finding difficulty. No gross focal neurologic deficits are appreciated.  Skin:  Skin is warm, dry and  intact. No rash noted. Psychiatric: Mood and affect are normal. Speech and behavior are normal.  ____________________________________________   LABS (all labs ordered are listed, but only abnormal results are displayed)  Labs Reviewed  COMPREHENSIVE METABOLIC PANEL - Abnormal; Notable for the following components:      Result Value   Glucose, Bld 139 (*)    Creatinine, Ser 1.43 (*)    Calcium 8.7 (*)    GFR, Estimated 56 (*)    All other components within normal limits  CBC WITH DIFFERENTIAL/PLATELET - Abnormal; Notable for the following components:   RBC 3.98 (*)    HCT 37.8 (*)    All other components within normal limits  URINALYSIS, COMPLETE (UACMP) WITH MICROSCOPIC - Abnormal; Notable for the following components:   Color, Urine STRAW (*)    APPearance CLEAR (*)    Glucose, UA >=500 (*)    Leukocytes,Ua TRACE (*)    All other components within normal limits  PROTIME-INR - Abnormal; Notable for the following components:   Prothrombin Time 57.5 (*)    INR 6.6 (*)    All other components within normal limits  APTT - Abnormal; Notable for the following components:   aPTT 80 (*)    All other components within normal limits  URINE CULTURE  PROTIME-INR  TYPE AND SCREEN  TYPE AND SCREEN  TYPE AND SCREEN   ____________________________________________  EKG   ____________________________________________  RADIOLOGY  CT HEAD WO CONTRAST (5MM)  Result Date: 12/30/2020 CLINICAL DATA:  Headache, uncomplicated (Ped 5-46T) Eval for bleeding, mild headache 4 days, INR > 10 with recent stroke. EXAM: CT HEAD WITHOUT CONTRAST TECHNIQUE: Contiguous axial images were obtained from the base of the skull through the vertex without intravenous contrast. COMPARISON:  12/17/2020. FINDINGS: Brain: Evolving infarcts within the right MCA territory. Multiple small acute hemorrhages in the region of evolving infarct in the right frontal and temporal lobes. For example, small hemorrhage in the  right frontal lobe (series 3, image 19) and small hemorrhage in the superior right temporal lobe (series 5, image 21). Additional faint acute hemorrhage in the inferior right frontal lobe (series 3, image 14). Edema in the inferior right frontal lobe is increased without midline shift. Remote right frontal and bilateral cerebellar infarcts. No hydrocephalus. No mass lesion. Vascular: Hyperdense vessel identified. Calcific intracranial atherosclerosis. Skull: No acute fracture. Sinuses/Orbits: Partially imaged right maxillary  sinus retention cyst. Unremarkable orbits. Other: No mastoid effusions. IMPRESSION: 1. Multiple new small acute intraparenchymal hemorrhages within the right frontal and temporal lobes in the region of the evolving right MCA territory infarct, detailed above. 2. Edema in the inferior right frontal lobe is increased without midline shift. 3. Chronic right frontal and bilateral cerebellar infarcts. Findings discussed with Dr. Quinn Axe via telephone at 1:57 PM. Also discussed with Dr. Jacqualine Code at 2:00 PM. Electronically Signed   By: Margaretha Sheffield M.D.   On: 12/30/2020 14:10   Personally viewed CT of the head and also discussed with radiologist ___________________________________________   PROCEDURES  Procedure(s) performed: None  Procedures  Critical Care performed: Yes, see critical care note(s)  CRITICAL CARE Performed by: Delman Kitten   Total critical care time: 45 minutes  Critical care time was exclusive of separately billable procedures and treating other patients.  Critical care was necessary to treat or prevent imminent or life-threatening deterioration.  Critical care was time spent personally by me on the following activities: development of treatment plan with patient and/or surrogate as well as nursing, discussions with consultants, evaluation of patient's response to treatment, examination of patient, obtaining history from patient or surrogate, ordering and  performing treatments and interventions, ordering and review of laboratory studies, ordering and review of radiographic studies, pulse oximetry and re-evaluation of patient's condition.  ____________________________________________   INITIAL IMPRESSION / ASSESSMENT AND PLAN / ED COURSE  Pertinent labs & imaging results that were available during my care of the patient were reviewed by me and considered in my medical decision making (see chart for details).   Patient presents for concerns of supratherapeutic INR greater than 10 on 2 checks.  Referred for evaluation by cardiology for this concern.  We will recheck INR today, but I am slightly concerned as he reports he is been having a mild very slight headache for 4 to 5 days the CT of the head might be helpful to exclude intracranial hemorrhage in the setting postinfarct.  Neurologically intact, only occasional mild word finding difficulty which is been present for several days and since discharge per patient  Clinical Course as of 12/30/20 1555  Fri Dec 30, 2020  1405 Dr. Quinn Axe at bedside.  Discussed with radiologist patient does have area of intracranial hemorrhage on CT. currently his blood pressure is 130/70, he is awake and alert without distress. [MQ]  1431 Goal blood pressure systolic less than or equal to 140.  Eppie Gibson has been ordered along with vitamin K pharmacist currently going to obtain this for reversal.  Dr. Quinn Axe has arranged transfer, patient is excepted to the neuro ICU at [facility] and he and his wife are agreeable with this plan.  Accepting doctor is Dr. Kathrynn Speed  I am continue to watch the patient's blood pressure closely, it is a last couple checks range from about 557-322 systolic, and should it remain elevated on next check in about 10 to 15 minutes anticipate starting Cleviprex [MQ]    Clinical Course User Index [MQ] Delman Kitten, MD   ----------------------------------------- 2:08 PM on  12/30/2020 ----------------------------------------- With intracranial hemorrhage, supratherapeutic INR.  Dr. Quinn Axe currently seeing the patient developing plan for best reversal strategy in the somewhat complex case that he also has a known left ventricular thrombus, she will be discussing with cardiology to determine risk benefits and goals for reversal.    Urinalysis added patient also reports has had slight dysuria since having Foley catheter discontinued.   Ongoing care assigned to  Dr. Charna Archer.  Patient currently pending transfer has been accepted via [facility] to neuro ICU at [facility].  Monitoring blood pressures closely,  Vitals:   12/30/20 1418 12/30/20 1547  BP: (!) 145/80 123/75  Pulse: 64 (!) 57  Resp: 18 18  Temp:    SpO2: 99% 100%    ____________________________________________   FINAL CLINICAL IMPRESSION(S) / ED DIAGNOSES  Final diagnoses:  Intracranial hemorrhage (HCC)  Supratherapeutic international normalized ratio (INR)  Dysuria        Note:  This document was prepared using Dragon voice recognition software and may include unintentional dictation errors       Delman Kitten, MD 12/30/20 1555

## 2020-12-30 NOTE — ED Notes (Signed)
Pt accepted to [facility] Neuro ICU [facility] per Joelene Millin, call [phone] for report. [facility] to transport when truck is available.

## 2020-12-30 NOTE — ED Notes (Signed)
Pt given a warm blanket 
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
## 2020-12-30 NOTE — ED Notes (Signed)
Pt endorsing worsening expressive aphasia

## 2020-12-31 ENCOUNTER — Inpatient Hospital Stay (HOSPITAL_COMMUNITY)
Admission: AD | Admit: 2020-12-31 | Discharge: 2021-01-01 | DRG: 813 | Disposition: A | Discharge status: Home / Self Care | Payer: 59 | Source: Ambulatory Visit | Attending: Neurology | Admitting: Neurology

## 2020-12-31 ENCOUNTER — Encounter (HOSPITAL_COMMUNITY): Payer: Self-pay | Admitting: Neurology

## 2020-12-31 DIAGNOSIS — Z8673 Personal history of transient ischemic attack (TIA), and cerebral infarction without residual deficits: Secondary | ICD-10-CM | POA: Diagnosis not present

## 2020-12-31 DIAGNOSIS — D6832 Hemorrhagic disorder due to extrinsic circulating anticoagulants: Secondary | ICD-10-CM | POA: Diagnosis not present

## 2020-12-31 DIAGNOSIS — Z7984 Long term (current) use of oral hypoglycemic drugs: Secondary | ICD-10-CM | POA: Diagnosis not present

## 2020-12-31 DIAGNOSIS — I619 Nontraumatic intracerebral hemorrhage, unspecified: Secondary | ICD-10-CM | POA: Diagnosis present

## 2020-12-31 DIAGNOSIS — E669 Obesity, unspecified: Secondary | ICD-10-CM | POA: Diagnosis present

## 2020-12-31 DIAGNOSIS — I629 Nontraumatic intracranial hemorrhage, unspecified: Secondary | ICD-10-CM | POA: Diagnosis not present

## 2020-12-31 DIAGNOSIS — R300 Dysuria: Secondary | ICD-10-CM | POA: Diagnosis not present

## 2020-12-31 DIAGNOSIS — I2510 Atherosclerotic heart disease of native coronary artery without angina pectoris: Secondary | ICD-10-CM | POA: Diagnosis present

## 2020-12-31 DIAGNOSIS — I6011 Nontraumatic subarachnoid hemorrhage from right middle cerebral artery: Secondary | ICD-10-CM | POA: Diagnosis present

## 2020-12-31 DIAGNOSIS — T45515A Adverse effect of anticoagulants, initial encounter: Secondary | ICD-10-CM | POA: Diagnosis present

## 2020-12-31 DIAGNOSIS — Z79890 Hormone replacement therapy (postmenopausal): Secondary | ICD-10-CM | POA: Diagnosis not present

## 2020-12-31 DIAGNOSIS — I611 Nontraumatic intracerebral hemorrhage in hemisphere, cortical: Secondary | ICD-10-CM | POA: Diagnosis present

## 2020-12-31 DIAGNOSIS — R001 Bradycardia, unspecified: Secondary | ICD-10-CM | POA: Diagnosis present

## 2020-12-31 DIAGNOSIS — R791 Abnormal coagulation profile: Secondary | ICD-10-CM

## 2020-12-31 DIAGNOSIS — Z8249 Family history of ischemic heart disease and other diseases of the circulatory system: Secondary | ICD-10-CM

## 2020-12-31 DIAGNOSIS — I616 Nontraumatic intracerebral hemorrhage, multiple localized: Secondary | ICD-10-CM | POA: Diagnosis not present

## 2020-12-31 DIAGNOSIS — I6389 Other cerebral infarction: Secondary | ICD-10-CM | POA: Diagnosis not present

## 2020-12-31 DIAGNOSIS — E785 Hyperlipidemia, unspecified: Secondary | ICD-10-CM | POA: Diagnosis present

## 2020-12-31 DIAGNOSIS — Z6833 Body mass index (BMI) 33.0-33.9, adult: Secondary | ICD-10-CM | POA: Diagnosis not present

## 2020-12-31 DIAGNOSIS — Z8349 Family history of other endocrine, nutritional and metabolic diseases: Secondary | ICD-10-CM | POA: Diagnosis not present

## 2020-12-31 DIAGNOSIS — I129 Hypertensive chronic kidney disease with stage 1 through stage 4 chronic kidney disease, or unspecified chronic kidney disease: Secondary | ICD-10-CM | POA: Diagnosis not present

## 2020-12-31 DIAGNOSIS — I252 Old myocardial infarction: Secondary | ICD-10-CM | POA: Diagnosis not present

## 2020-12-31 DIAGNOSIS — E039 Hypothyroidism, unspecified: Secondary | ICD-10-CM | POA: Diagnosis present

## 2020-12-31 DIAGNOSIS — Z79899 Other long term (current) drug therapy: Secondary | ICD-10-CM

## 2020-12-31 DIAGNOSIS — I10 Essential (primary) hypertension: Secondary | ICD-10-CM | POA: Diagnosis not present

## 2020-12-31 DIAGNOSIS — E119 Type 2 diabetes mellitus without complications: Secondary | ICD-10-CM | POA: Diagnosis present

## 2020-12-31 DIAGNOSIS — I69392 Facial weakness following cerebral infarction: Secondary | ICD-10-CM

## 2020-12-31 DIAGNOSIS — I69320 Aphasia following cerebral infarction: Secondary | ICD-10-CM | POA: Diagnosis not present

## 2020-12-31 DIAGNOSIS — Z7901 Long term (current) use of anticoagulants: Secondary | ICD-10-CM

## 2020-12-31 DIAGNOSIS — Z955 Presence of coronary angioplasty implant and graft: Secondary | ICD-10-CM | POA: Diagnosis not present

## 2020-12-31 DIAGNOSIS — Z825 Family history of asthma and other chronic lower respiratory diseases: Secondary | ICD-10-CM

## 2020-12-31 DIAGNOSIS — E1122 Type 2 diabetes mellitus with diabetic chronic kidney disease: Secondary | ICD-10-CM | POA: Diagnosis not present

## 2020-12-31 DIAGNOSIS — N1831 Chronic kidney disease, stage 3a: Secondary | ICD-10-CM | POA: Diagnosis not present

## 2020-12-31 DIAGNOSIS — I513 Intracardiac thrombosis, not elsewhere classified: Secondary | ICD-10-CM | POA: Diagnosis present

## 2020-12-31 DIAGNOSIS — Z20822 Contact with and (suspected) exposure to covid-19: Secondary | ICD-10-CM | POA: Diagnosis not present

## 2020-12-31 LAB — CBC
HCT: 42.7 % (ref 39.0–52.0)
Hemoglobin: 14.3 g/dL (ref 13.0–17.0)
MCH: 31.7 pg (ref 26.0–34.0)
MCHC: 33.5 g/dL (ref 30.0–36.0)
MCV: 94.7 fL (ref 80.0–100.0)
Platelets: 246 10*3/uL (ref 150–400)
RBC: 4.51 MIL/uL (ref 4.22–5.81)
RDW: 11.9 % (ref 11.5–15.5)
WBC: 10.8 10*3/uL — ABNORMAL HIGH (ref 4.0–10.5)
nRBC: 0 % (ref 0.0–0.2)

## 2020-12-31 LAB — BASIC METABOLIC PANEL
Anion gap: 8 (ref 5–15)
BUN: 16 mg/dL (ref 8–23)
CO2: 24 mmol/L (ref 22–32)
Calcium: 9.1 mg/dL (ref 8.9–10.3)
Chloride: 105 mmol/L (ref 98–111)
Creatinine, Ser: 1.3 mg/dL — ABNORMAL HIGH (ref 0.61–1.24)
GFR, Estimated: >60 mL/min (ref >60)
Glucose, Bld: 156 mg/dL — ABNORMAL HIGH (ref 70–99)
Potassium: 4.1 mmol/L (ref 3.5–5.1)
Sodium: 137 mmol/L (ref 135–145)

## 2020-12-31 LAB — URINALYSIS, COMPLETE (UACMP) WITH MICROSCOPIC
Bilirubin Urine: NEGATIVE
Glucose, UA: >=500 mg/dL — AB
Hgb urine dipstick: NEGATIVE
Ketones, ur: NEGATIVE mg/dL
Nitrite: NEGATIVE
Protein, ur: NEGATIVE mg/dL
Specific Gravity, Urine: 1.018 (ref 1.005–1.030)
pH: 6 (ref 5.0–8.0)

## 2020-12-31 LAB — GLUCOSE, CAPILLARY
Glucose-Capillary: 195 mg/dL — ABNORMAL HIGH (ref 70–99)
Glucose-Capillary: 193 mg/dL — ABNORMAL HIGH (ref 70–99)
Glucose-Capillary: 191 mg/dL — ABNORMAL HIGH (ref 70–99)
Glucose-Capillary: 219 mg/dL — ABNORMAL HIGH (ref 70–99)

## 2020-12-31 LAB — RESP PANEL BY RT-PCR (FLU A&B, COVID) ARPGX2
Influenza A by PCR: NEGATIVE
Influenza B by PCR: NEGATIVE
SARS Coronavirus 2 by RT PCR: NEGATIVE

## 2020-12-31 MED ORDER — STROKE: EARLY STAGES OF RECOVERY BOOK
Freq: Once | Status: AC
  Filled 2020-12-31: qty 1

## 2020-12-31 MED ORDER — LABETALOL HCL 5 MG/ML IV SOLN: 10.0000 mg | INTRAVENOUS | Status: DC | PRN

## 2020-12-31 MED ORDER — ACETAMINOPHEN 650 MG RE SUPP: 650.0000 mg | RECTAL | Status: DC | PRN

## 2020-12-31 MED ORDER — PANTOPRAZOLE SODIUM 40 MG PO TBEC
40.0000 mg | DELAYED_RELEASE_TABLET | Freq: Every day | ORAL | Status: DC
  Administered 2020-12-31: 40 mg via ORAL
  Filled 2020-12-31: qty 1

## 2020-12-31 MED ORDER — ATORVASTATIN CALCIUM 80 MG PO TABS: 80.0000 mg | ORAL_TABLET | Freq: Every day | ORAL | Status: DC

## 2020-12-31 MED ORDER — LEVOTHYROXINE SODIUM 75 MCG PO TABS
75.0000 ug | ORAL_TABLET | Freq: Every day | ORAL | Status: DC
  Administered 2020-12-31 – 2021-01-01 (×2): 75 ug via ORAL
  Filled 2020-12-31 (×2): qty 1

## 2020-12-31 MED ORDER — ACETAMINOPHEN 325 MG PO TABS: 650.0000 mg | ORAL_TABLET | ORAL | Status: DC | PRN

## 2020-12-31 MED ORDER — ACETAMINOPHEN 160 MG/5ML PO SOLN: 650.0000 mg | ORAL | Status: DC | PRN

## 2020-12-31 MED ORDER — INSULIN ASPART 100 UNIT/ML IJ SOLN
0.0000 [IU] | Freq: Every day | INTRAMUSCULAR | Status: DC
  Administered 2020-12-31: 2 [IU] via SUBCUTANEOUS

## 2020-12-31 MED ORDER — PANTOPRAZOLE SODIUM 40 MG IV SOLR: 40.0000 mg | Freq: Every day | INTRAVENOUS | Status: DC

## 2020-12-31 MED ORDER — CHLORHEXIDINE GLUCONATE CLOTH 2 % EX PADS: 6.0000 | MEDICATED_PAD | Freq: Every day | CUTANEOUS | Status: DC

## 2020-12-31 MED ORDER — LISINOPRIL 20 MG PO TABS
40.0000 mg | ORAL_TABLET | Freq: Every day | ORAL | Status: DC
  Administered 2020-12-31: 40 mg via ORAL
  Filled 2020-12-31: qty 2

## 2020-12-31 MED ORDER — EZETIMIBE 10 MG PO TABS
10.0000 mg | ORAL_TABLET | Freq: Every day | ORAL | Status: DC
  Administered 2020-12-31: 10 mg via ORAL
  Filled 2020-12-31: qty 1

## 2020-12-31 MED ORDER — METOPROLOL SUCCINATE ER 50 MG PO TB24: 50.0000 mg | ORAL_TABLET | Freq: Every day | ORAL | Status: DC

## 2020-12-31 MED ORDER — INSULIN ASPART 100 UNIT/ML IJ SOLN
0.0000 [IU] | Freq: Three times a day (TID) | INTRAMUSCULAR | Status: DC
  Administered 2020-12-31 (×3): 3 [IU] via SUBCUTANEOUS
  Administered 2021-01-01: 2 [IU] via SUBCUTANEOUS

## 2020-12-31 MED ORDER — SENNOSIDES-DOCUSATE SODIUM 8.6-50 MG PO TABS
1.0000 | ORAL_TABLET | Freq: Two times a day (BID) | ORAL | Status: DC
  Filled 2020-12-31 (×3): qty 1

## 2020-12-31 NOTE — H&P (Signed)
NEUROLOGY CONSULTATION NOTE   Date of service: December 31, 2020 Patient Name: Karl Morgan MRN:  [id number] DOB:  October 11, 1960 Reason for consult: "ICH in the setting of supratherapeutic INR" Requesting Provider: Donnetta Simpers, MD _ _ _   _ __   _ __ _ _  __ __   _ __   __ _  History of Present Illness  Karl Morgan is a 60 y.o. male with PMH significant for DM2, HTN, STEMI 2016, HL, recent admission to [facility] for acute ischemic R MCA stroke s/p thrombectomy for R M1 occlusion s/p intervention with mild residual aphasia. Also found to have apical thrombus on TTE and started on comuadin who has had labile INR with an INR of > 10 at cardiologist's office and headache. He was asked to come to the ED and CTH with new small acute hemorrhages in the right frontal and temporal lobes in the region of his recent evolving R MCA territory stroke.  He underwent reversal with 10 units of Vit K and 2109 units of Kcentra and repeat CTH @ 6 hours with stable ICH. He was transferred to [facility] ICU for close monitoring and evaluation.  mRS: 0 ICH score: 0 NIHSS components Score: Comment  1a Level of Conscious 0[x]  1[]  2[]  3[]      1b LOC Questions 0[x]  1[]  2[]       1c LOC Commands 0[x]  1[]  2[]       2 Best Gaze 0[x]  1[]  2[]       3 Visual 0[x]  1[]  2[]  3[]      4 Facial Palsy 0[x]  1[]  2[]  3[]      5a Motor Arm - left 0[x]  1[]  2[]  3[]  4[]  UN[]    5b Motor Arm - Right 0[x]  1[]  2[]  3[]  4[]  UN[]    6a Motor Leg - Left 0[x]  1[]  2[]  3[]  4[]  UN[]    6b Motor Leg - Right 0[x]  1[]  2[]  3[]  4[]  UN[]    7 Limb Ataxia 0[x]  1[]  2[]  3[]  UN[]     8 Sensory 0[x]  1[]  2[]  UN[]      9 Best Language 0[]  1[x]  2[]  3[]    Mild aphasia  10 Dysarthria 0[x]  1[]  2[]  UN[]      11 Extinct. and Inattention 0[x]  1[]  2[]       TOTAL: 0      ROS   Constitutional Denies weight loss, fever and chills.   HEENT Denies changes in vision and hearing.   Respiratory Denies SOB and cough.   CV Denies palpitations and CP   GI Denies abdominal  pain, nausea, vomiting and diarrhea.   GU Denies dysuria and urinary frequency.   MSK Denies myalgia and joint pain.   Skin Denies rash and pruritus.   Neurological Denies headache and syncope.   Psychiatric Denies recent changes in mood. Denies anxiety and depression.    Past History   Past Medical History:  Diagnosis Date  . Alopecia 04/04/2011  . Diabetes mellitus   . Erectile dysfunction 04/04/2011  . Heart attack ([facility]) 10/07/2014   Overview:  Status post STEMI, anterior myocardial infarction status post DES stent x 2 to the LAD, mid and distal.   . Heart murmur   . Hyperlipidemia 04/04/2011  . Hypogonadism male 04/04/2011  . Myocardial infarction ([place])   . Stroke ([place])   . Substance abuse ([place])   . Type II or unspecified type diabetes mellitus without mention of complication, uncontrolled 04/04/2011  . Unspecified essential hypertension 04/04/2011   Past Surgical History:  Procedure Laterality Date  . BUBBLE  STUDY  08/03/2019   Procedure: BUBBLE STUDY;  Surgeon: Fay Records, MD;  Location: [place];  Service: Cardiovascular;;  . CARDIAC CATHETERIZATION    . IR ANGIO INTRA EXTRACRAN SEL COM CAROTID INNOMINATE UNI L MOD SED  12/17/2020  . IR CT HEAD LTD  12/17/2020  . IR PERCUTANEOUS ART THROMBECTOMY/INFUSION INTRACRANIAL INC DIAG ANGIO  12/17/2020  . IR US GUIDE VASC ACCESS RIGHT  12/17/2020  . LOOP RECORDER INSERTION N/A 08/03/2019   Procedure: LOOP RECORDER INSERTION;  Surgeon: Evans Lance, MD;  Location: [place] CV LAB;  Service: Cardiovascular;  Laterality: N/A;  . RADIOLOGY WITH ANESTHESIA N/A 12/17/2020   Procedure: IR WITH ANESTHESIA;  Surgeon: Luanne Bras, MD;  Location: [place];  Service: Radiology;  Laterality: N/A;  . TEE WITHOUT CARDIOVERSION N/A 08/03/2019   Procedure: TRANSESOPHAGEAL ECHOCARDIOGRAM (TEE);  Surgeon: Fay Records, MD;  Location: [facility] ENDOSCOPY;  Service: Cardiovascular;  Laterality: N/A;   Family History  Problem Relation Age of Onset  .  Hypertension Father   . Dementia Father        died age 78 in July 08, 2017  . Memory loss Father   . Emphysema Mother   . COPD Mother   . Thyroid disease Mother   . Cancer Neg Hx   . Diabetes Neg Hx   . Heart disease Neg Hx   . Stroke Neg Hx    Social History   Socioeconomic History  . Marital status: Married    Spouse name: Not on file  . Number of children: Not on file  . Years of education: Not on file  . Highest education level: Not on file  Occupational History  . Occupation: OPERATIONS MANAGER of maintenance    Employer: [employer]  Tobacco Use  . Smoking status: Never  . Smokeless tobacco: Never  Vaping Use  . Vaping Use: Never used  Substance and Sexual Activity  . Alcohol use: No    Comment: former etoh abuse in 54s   . Drug use: No  . Sexual activity: Yes  Other Topics Concern  . 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 works in [place] wife Vickie             Social Determinants of Radio broadcast assistant Strain: Not on [employer] Insecurity: Not on file  Transportation Needs: Not on file  Physical Activity: Not on file  Stress: Not on file  Social Connections: Not on file   Allergies  Allergen Reactions  . Other Other (See Comments)    Rybelsus ab pain at 3 mg, 7 mg n/v  . Pravachol [Pravastatin Sodium] Other (See Comments)    Muscle aches    Medications   Medications Prior to Admission  Medication Sig Dispense Refill Last Dose  . atorvastatin (LIPITOR) 80 MG tablet Take 1 tablet (80 mg total) by mouth daily. 90 tablet 0   . dapagliflozin propanediol (FARXIGA) 10 MG  TABS tablet Take 1 tablet (10 mg  total) by mouth daily. 30 tablet 0   . ezetimibe (ZETIA) 10 MG tablet Take 1 tablet (10 mg total) by mouth daily. 90 tablet 0   . glimepiride (AMARYL) 2 MG tablet TAKE 1 TABLET (2 MG TOTAL) BY MOUTH 2 (TWO) TIMES DAILY WITH A MEAL. (Patient taking differently: Take 2 mg by mouth daily with breakfast.) 180 tablet 3   . levothyroxine (SYNTHROID) 75 MCG tablet Take 1 tablet (75 mcg total) by mouth daily. 90 tablet 3   . lisinopril (ZESTRIL) 40 MG tablet TAKE 1 TABLET (40 MG TOTAL) BY MOUTH DAILY. 90 tablet 3   . metFORMIN (GLUCOPHAGE) 850 MG tablet TAKE 1 TABLET BY MOUTH TWICE DAILY 180 tablet 3   . metoprolol succinate (TOPROL-XL) 50 MG 24 hr tablet TAKE 1 TABLET BY MOUTH DAILY. TAKE WITH OR IMMEDIATELY FOLLOWING A MEAL. 90 tablet 3   . pantoprazole (PROTONIX) 40 MG tablet Take 1 tablet (40 mg total) by mouth daily. 90 tablet 0   . sitaGLIPtin (JANUVIA) 100 MG tablet TAKE 1 TABLET (100 MG TOTAL) BY MOUTH DAILY. (Patient not taking: Reported on 12/30/2020) 90 tablet 3   . valACYclovir (VALTREX) 1000 MG tablet take 1 to 2 tablets by mouth every 12 hours for 2 to 3 days as needed for fever blisters 90 tablet 3   . warfarin (COUMADIN) 5 MG tablet Take 2.5 mg (half a tablet) by mouth on Fridays, take 5 mg (1 tablet) on Sunday, Monday, Tuesday, Wednesday, Thursday, and Saturday 90 tablet 0      Vitals   There were no vitals filed for this visit.   There is no height or weight on file to calculate BMI.  Physical Exam   General: Laying comfortably in bed; in no acute distress. HENT: Normal oropharynx and mucosa. Normal external appearance of ears and nose.  Neck: Supple, no pain or tenderness  CV: No JVD. No peripheral edema.  Pulmonary: Symmetric Chest rise. Normal respiratory effort.  Abdomen: Soft to touch, non-tender.  Ext: No cyanosis, edema, or deformity  Skin: No rash. Normal palpation of skin.   Musculoskeletal: Normal digits and nails by inspection. No  clubbing.   Neurologic Examination  Mental status/Cognition: Alert, oriented to self, place, month and year, good attention.  Speech/language: Fluent, comprehension intact, mild aphasia and gets stuck object naming intact, repetition intact.  Cranial nerves:   CN II Pupils equal and reactive to light, no VF deficits    CN III,IV,VI EOM intact, no gaze preference or deviation, no nystagmus    CN V normal sensation in V1, V2, and V3 segments bilaterally    CN VII no asymmetry, no nasolabial fold flattening    CN VIII normal hearing to speech    CN IX & X normal palatal elevation, no uvular deviation    CN XI 5/5 head turn and 5/5 shoulder shrug bilaterally    CN XII midline tongue protrusion    Motor:  Muscle bulk: normal, tone normal, pronator drift: none Mvmt Root Nerve  Muscle Right Left Comments  SA C5/6 Ax Deltoid 5 5   EF C5/6 Mc Biceps 5 5   EE C6/7/8 Rad Triceps 5 5   WF C6/7 Med FCR     WE C7/8 PIN ECU     F Ab C8/T1 U ADM/FDI 5 5   HF L1/2/3 Fem Illopsoas 5 5   KE L2/3/4 Fem Quad 5 5   DF L4/5 D Peron Tib Ant 5 5   PF S1/2 Tibial  Grc/Sol 5 5    Reflexes:  Right Left Comments  Pectoralis      Biceps (C5/6) 2 2   Brachioradialis (C5/6) 2 2    Triceps (C6/7) 2 2    Patellar (L3/4) 2 2    Achilles (S1)      Hoffman      Plantar     Jaw jerk    Sensation:  Light touch intact   Pin prick    Temperature    Vibration   Proprioception    Coordination/Complex Motor:  - Finger to Nose intact BL - Heel to shin intact BL - Rapid alternating movement are normal - Gait: unsafe to assess given ICH.  Labs   CBC:  Recent Labs  Lab 12/30/20 1300  WBC 9.6  NEUTROABS 7.4  HGB 13.0  HCT 37.8*  MCV 95.0  PLT 854    Basic Metabolic Panel:  Lab Results  Component Value Date   NA 138 12/30/2020   K 4.7 12/30/2020   CO2 26 12/30/2020   GLUCOSE 139 (H) 12/30/2020   BUN 21 12/30/2020   CREATININE 1.43 (H) 12/30/2020   CALCIUM 8.7 (L) 12/30/2020   GFRNONAA 56  (L) 12/30/2020   GFRAA >60 11/03/2019   Lipid Panel:  Lab Results  Component Value Date   LDLCALC 49 12/18/2020   HgbA1c:  Lab Results  Component Value Date   HGBA1C 6.7 (H) 12/18/2020   Urine Drug Screen:     Component Value Date/Time   LABOPIA NONE DETECTED 07/31/2019 2330   COCAINSCRNUR NONE DETECTED 07/31/2019 2330   LABBENZ NONE DETECTED 07/31/2019 2330   AMPHETMU NONE DETECTED 07/31/2019 2330   THCU NONE DETECTED 07/31/2019 2330   LABBARB NONE DETECTED 07/31/2019 2330    Alcohol Level     Component Value Date/Time   ETH <10 07/31/2019 1938    CT Head without contrast(personally reviewed): Multiple new small acute intraparenchymal hemorrhages within the right frontal and temporal lobes in the region of the evolving right MCA territory infarct, detailed above.  Edema in the inferior right frontal lobe is increased without midline shift.  Chronic right frontal and bilateral cerebellar infarcts.  Repeat CTH w/o contrast: Decreased conspicuity of recently identified areas of small volume hemorrhage in the right hemisphere.  Unchanged appearance of inferior right frontal lobe infarct.  Unchanged appearance of old bilateral cerebellar infarcts.  Impression   Steffen F San Morgan is a 60 y.o. male with PMH significant for DM2, HTN, STEMI 2016, HL, recent admission to [facility] for acute ischemic R MCA stroke s/p thrombectomy for R M1 occlusion s/p intervention with mild residual aphasia. Who has labile INR after starting warfarin for apical thrombus on echo and now admitted with multiple small IPH in R frontal and temporal lobes and s/p reversal with Vit K and K centra with INR now at 1.2. His neurologic examination is notable for mild aphasia that is residual from his recent R MCA stroke, he has no other new symptom.  Impression: Hemorrhagic transformation of stroke. Supratherapeutic INR Apical thrombus.  Recommendations  - Admit to ICU - Frequent neuro checks; q71min  for 1 hour, then q1hour - No antiplatelets or anticoagulants due to [place] - SCD for DVT prophylaxis, pharmacological DVT ppx at 24 hours if ICH is stable - Blood pressure control with goal systolic 627 - 035, cleverplex and labetalol PRN - PT consult, OT consult, Speech consult. - Stroke team to follow  This patient is critically ill and at significant risk of  neurological worsening, death and care requires constant monitoring of vital signs, hemodynamics,respiratory and cardiac monitoring, neurological assessment, discussion with family, other specialists and medical decision making of high complexity. I spent 35 minutes of neurocritical care time  in the care of  this patient. This was time spent independent of any time provided by nurse practitioner or PA.  Donnetta Simpers [facility] Pager Number [phone] 12/31/2020  3:38 AM    ______________________________________________________________________   Thank you for the opportunity to take part in the care of this patient. If you have any further questions, please contact the neurology consultation attending.  Signed,  [place] Pager Number [phone] _ _ _   _ __   _ __ _ _  __ __   _ __   __ _

## 2020-12-31 NOTE — Evaluation (Signed)
Speech Language Pathology Evaluation Patient Details Name: Karl Morgan MRN: [id number] DOB: 07-Jun-1960 Today's Date: 12/31/2020 Time: 9562-1308 SLP Time Calculation (min) (ACUTE ONLY): 15 min  Problem List:  Patient Active Problem List   Diagnosis Date Noted   ICH (intracerebral hemorrhage) ([place]) 12/31/2020   Thrombocytopenia ([place]) 12/23/2020   Left ventricular apical thrombus    Hypertension associated with diabetes ([place]) 11/08/2020   Elevated TSH 05/10/2020   Gallbladder polyp 11/12/2019   Fatty liver 11/12/2019   Bilateral carotid artery stenosis 08/06/2019   Mitral valve insufficiency 08/06/2019   Cerebrovascular accident (CVA) due to embolism of cerebral artery ([place]) 08/06/2019   Intracranial atherosclerosis 08/06/2019   CKD (chronic kidney disease), stage IIIa 08/03/2019   Arthritis 10/21/2018   Obesity (BMI 30-39.9) 08/22/2017   Herpes simplex type 1 infection 02/21/2015   Recurrent oral herpes simplex infection 02/21/2015   History of stroke 02/16/2015   Arteriosclerosis of coronary artery 10/07/2014   Hypotestosteronism 10/07/2014   B12 deficiency 12/18/2011   Hypothyroidism 05/04/2011   Annual physical exam 04/05/2011   Diabetes mellitus type 2, controlled, with complications ([place]) 65/78/4696   Essential hypertension 04/04/2011   Hyperlipidemia 04/04/2011   Hypogonadism male 04/04/2011   Alopecia 04/04/2011   Erectile dysfunction 04/04/2011   Past Medical History:  Past Medical History:  Diagnosis Date   Alopecia 04/04/2011   Diabetes mellitus    Erectile dysfunction 04/04/2011   Heart attack ([place]) 10/07/2014   Overview:  Status post STEMI, anterior myocardial infarction status post DES stent x 2 to the LAD, mid and distal.    Heart murmur    Hyperlipidemia 04/04/2011   Hypogonadism male 04/04/2011   Myocardial infarction [facility])    Stroke [facility])    Substance abuse ([place])    Type II or unspecified type diabetes mellitus without mention of complication, uncontrolled  04/04/2011   Unspecified essential hypertension 04/04/2011   Past Surgical History:  Past Surgical History:  Procedure Laterality Date   BUBBLE STUDY  08/03/2019   Procedure: BUBBLE STUDY;  Surgeon: Fay Records, MD;  Location: [facility] ENDOSCOPY;  Service: Cardiovascular;;   CARDIAC CATHETERIZATION     IR ANGIO INTRA EXTRACRAN SEL COM CAROTID INNOMINATE UNI L MOD SED  12/17/2020   IR CT HEAD LTD  12/17/2020   IR PERCUTANEOUS ART THROMBECTOMY/INFUSION INTRACRANIAL INC DIAG ANGIO  12/17/2020   IR US GUIDE VASC ACCESS RIGHT  12/17/2020   LOOP RECORDER INSERTION N/A 08/03/2019   Procedure: LOOP RECORDER INSERTION;  Surgeon: Evans Lance, MD;  Location: [place] CV LAB;  Service: Cardiovascular;  Laterality: N/A;   RADIOLOGY WITH ANESTHESIA N/A 12/17/2020   Procedure: IR WITH ANESTHESIA;  Surgeon: Luanne Bras, MD;  Location: [place];  Service: Radiology;  Laterality: N/A;   TEE WITHOUT CARDIOVERSION N/A 08/03/2019   Procedure: TRANSESOPHAGEAL ECHOCARDIOGRAM (TEE);  Surgeon: Fay Records, MD;  Location: [facility] ENDOSCOPY;  Service: Cardiovascular;  Laterality: N/A;   HPI:  Karl Morgan is a 60 y.o. male with PMH significant for DM2, HTN, STEMI 2016, HL, recent admission to [facility] for acute ischemic R MCA stroke s/p thrombectomy for R M1 occlusion s/p intervention with mild residual aphasia.  Head CT on 9/30 reported "Multiple new small acute intraparenchymal hemorrhages within the right frontal and temporal lobes in the region of the evolving right MCA territory infarct"   Assessment / Plan / Recommendation Clinical Impression  Pt was seen for a cognitive-linguistic evaluation and demonstrates continued mild, fluent aphasia (expressive > receptive) which is  most consistent with anomic aphasia.  Pt was recently admitted for CVA with above mentioned deficits, and pt/spouse reported that deficits have not appeared to worsen upon re-admission.  Pt exhibited the most difficulty with repetition of phrases and  sentences, with minimal difficulty noted with confrontational naming, divergent naming, and following 3+ step commands.  Phonemic and semantic paraphasias noted.  Pt was mostly aware of his errors, but required intermittent cues to identify errors, particularly during repetition tasks.  Pt would benefit from continued ST follow up acutely and in outpatient at [facility]; however, he reported that would prefer to only f/u in outpatient.  Therefore, ST will s/o at this time with recommendations for continued outpatient ST targeting aphasia at time of discharge.  Please re-consult if additional needs arise.    SLP Assessment  SLP Recommendation/Assessment: All further Speech Lanaguage Pathology  needs can be addressed in the next venue of care SLP Visit Diagnosis: Aphasia (R47.01)    Recommendations for follow up therapy are one component of a multi-disciplinary discharge planning process, led by the attending physician.  Recommendations may be updated based on patient status, additional functional criteria and insurance authorization.    Follow Up Recommendations  Outpatient SLP          SLP Evaluation Cognition  Overall Cognitive Status: Within Functional Limits for tasks assessed       Comprehension  Auditory Comprehension Overall Auditory Comprehension: Impaired Yes/No Questions: Within Functional Limits Commands: Impaired Conversation: Complex    Expression Expression Primary Mode of Expression: Verbal Verbal Expression Overall Verbal Expression: Impaired at baseline Initiation: No impairment Level of Generative/Spontaneous Verbalization: Conversation Repetition: Impaired Level of Impairment: Phrase level Naming: Impairment Confrontation: Impaired Divergent: 75-100% accurate Verbal Errors: Semantic paraphasias;Phonemic paraphasias;Aware of errors   Oral / Motor  Oral Motor/Sensory Function Overall Oral Motor/Sensory Function: Within functional limits Motor Speech Overall Motor  Speech: Appears within functional limits for tasks assessed                     Colin Mulders M.S., CCC-SLP Acute Rehabilitation Services Office: [phone]  [place] 12/31/2020, 10:42 AM

## 2020-12-31 NOTE — ED Notes (Signed)
[facility] enroute to transport pt to [facility] ICU

## 2020-12-31 NOTE — Plan of Care (Signed)
  Problem: Education: Goal: Knowledge of General Education information will improve Description: Including pain rating scale, medication(s)/side effects and non-pharmacologic comfort measures Outcome: Progressing   Problem: Health Behavior/Discharge Planning: Goal: Ability to manage health-related needs will improve Outcome: Progressing   Problem: Clinical Measurements: Goal: Will remain free from infection Outcome: Progressing Goal: Diagnostic test results will improve Outcome: Progressing   Problem: Skin Integrity: Goal: Risk for impaired skin integrity will decrease Outcome: Progressing   Problem: Activity: Goal: Risk for activity intolerance will decrease Outcome: Completed/Met   Problem: Nutrition: Goal: Adequate nutrition will be maintained Outcome: Completed/Met   Problem: Elimination: Goal: Will not experience complications related to urinary retention Outcome: Completed/Met   Problem: Pain Managment: Goal: General experience of comfort will improve Outcome: Completed/Met

## 2020-12-31 NOTE — Progress Notes (Signed)
OT Cancellation Note  Patient Details Name: Karl Morgan MRN: [id number] DOB: [birth year]   Cancelled Treatment:    Reason Eval/Treat Not Completed: Active bedrest order;Patient not medically ready.  Will reattempt.  Nilsa Nutting., OTR/L Acute Rehabilitation Services Pager [phone] Office [phone]   Lucille Passy M 12/31/2020, 8:15 AM

## 2020-12-31 NOTE — ED Notes (Signed)
EMTALA reviewed by this RN.  

## 2020-12-31 NOTE — Progress Notes (Signed)
STROKE TEAM PROGRESS NOTE   INTERVAL HISTORY No acute events Tmax 98.3, BP 101-123/50-70 since ICU admission. Not requiring infusions. On room air saturating 93-97%  Patient reports he feels fine and does not feel like he should be here. He does not feel he had a stroke, is frustrated with admission and frequent neurologic checks/monitoring and does not wish to remain in the hospital.    Patient was educated regarding his stroke diagnosis, ongoing work up, medication management (need for warfarin cessation, risk/benefit of anticoagulant) and plan of care.   Vitals:   12/31/20 0400 12/31/20 0500 12/31/20 0600 12/31/20 0700  BP: 115/66 112/70 (!) 101/56 119/75  Pulse: (!) 58 63 62 (!) 59  Resp: 16 16 15 19   Temp: 98.3 F (36.8 C)     TempSrc: Oral     SpO2: 94% 93% 93% 93%   CBC:  Recent Labs  Lab 12/30/20 1300  WBC 9.6  NEUTROABS 7.4  HGB 13.0  HCT 37.8*  MCV 95.0  PLT 144   Basic Metabolic Panel:  Recent Labs  Lab 12/30/20 1300  NA 138  K 4.7  CL 104  CO2 26  GLUCOSE 139*  BUN 21  CREATININE 1.43*  CALCIUM 8.7*   Lipid Panel: No results for input(s): CHOL, TRIG, HDL, CHOLHDL, VLDL, LDLCALC in the last 168 hours. HgbA1c: No results for input(s): HGBA1C in the last 168 hours. Urine Drug Screen: No results for input(s): LABOPIA, COCAINSCRNUR, LABBENZ, AMPHETMU, THCU, LABBARB in the last 168 hours.  Alcohol Level No results for input(s): ETH in the last 168 hours.  IMAGING past 24 hours CT Head Wo Contrast  Result Date: 12/30/2020 CLINICAL DATA:  Stroke follow-up EXAM: CT HEAD WITHOUT CONTRAST TECHNIQUE: Contiguous axial images were obtained from the base of the skull through the vertex without intravenous contrast. COMPARISON:  Head CT 12/30/2020 FINDINGS: Brain: Decreased conspicuity recently identified areas small volume hemorrhage in the right hemisphere. Hypodensity of the inferior right frontal lobe is unchanged. Unchanged appearance of old bilateral cerebellar  infarcts. No midline shift or other mass effect. Vascular: No abnormal hyperdensity of the major intracranial arteries or dural venous sinuses. No intracranial atherosclerosis. Skull: The visualized skull base, calvarium and extracranial soft tissues are normal. Sinuses/Orbits: No fluid levels or advanced mucosal thickening of the visualized paranasal sinuses. No mastoid or middle ear effusion. The orbits are normal. IMPRESSION: 1. Decreased conspicuity of recently identified areas of small volume hemorrhage in the right hemisphere. 2. Unchanged appearance of inferior right frontal lobe infarct. 3. Unchanged appearance of old bilateral cerebellar infarcts. Electronically Signed   By: Ulyses Jarred M.D.   On: 12/30/2020 20:04   CT HEAD WO CONTRAST (5MM)  Result Date: 12/30/2020 CLINICAL DATA:  Headache, uncomplicated (Ped 8-18H) Eval for bleeding, mild headache 4 days, INR > 10 with recent stroke. EXAM: CT HEAD WITHOUT CONTRAST TECHNIQUE: Contiguous axial images were obtained from the base of the skull through the vertex without intravenous contrast. COMPARISON:  12/17/2020. FINDINGS: Brain: Evolving infarcts within the right MCA territory. Multiple small acute hemorrhages in the region of evolving infarct in the right frontal and temporal lobes. For example, small hemorrhage in the right frontal lobe (series 3, image 19) and small hemorrhage in the superior right temporal lobe (series 5, image 21). Additional faint acute hemorrhage in the inferior right frontal lobe (series 3, image 14). Edema in the inferior right frontal lobe is increased without midline shift. Remote right frontal and bilateral cerebellar infarcts. No hydrocephalus. No mass  lesion. Vascular: Hyperdense vessel identified. Calcific intracranial atherosclerosis. Skull: No acute fracture. Sinuses/Orbits: Partially imaged right maxillary sinus retention cyst. Unremarkable orbits. Other: No mastoid effusions. IMPRESSION: 1. Multiple new small acute  intraparenchymal hemorrhages within the right frontal and temporal lobes in the region of the evolving right MCA territory infarct, detailed above. 2. Edema in the inferior right frontal lobe is increased without midline shift. 3. Chronic right frontal and bilateral cerebellar infarcts. Findings discussed with Dr. Quinn Axe via telephone at 1:57 PM. Also discussed with Dr. Jacqualine Code at 2:00 PM. Electronically Signed   By: Margaretha Sheffield M.D.   On: 12/30/2020 14:10    PHYSICAL EXAM  Temp:  [97.8 F (36.6 C)-98.3 F (36.8 C)] 98.3 F (36.8 C) (10/01 0400) Pulse Rate:  [55-77] 60 (10/01 0800) Resp:  [15-22] 20 (10/01 0800) BP: (99-177)/(33-89) 159/89 (10/01 0918) SpO2:  [93 %-100 %] 94 % (10/01 0800) Weight:  [106.1 kg] 106.1 kg (09/30 1223)  General - Well nourished, well developed, in no apparent distress.  Ophthalmologic - fundi not visualized due to noncooperation.  Cardiovascular - Regular rhythm and rate.  Mental Status -  Level of arousal and orientation to time, place, and person were intact. Language including expression, naming, repetition, comprehension was assessed and found intact except occasional word finding difficulty. Fund of Knowledge was assessed and was intact.  Cranial Nerves II - XII - II - Visual field intact OU. III, IV, VI - Extraocular movements intact. V - Facial sensation intact bilaterally. VII - Facial movement intact bilaterally. VIII - Hearing & vestibular intact bilaterally. X - Palate elevates symmetrically. XI - Chin turning & shoulder shrug intact bilaterally. XII - Tongue protrusion intact.  Motor Strength - The patient's strength was normal in all extremities and pronator drift was absent.  Bulk was normal and fasciculations were absent.   Motor Tone - Muscle tone was assessed at the neck and appendages and was normal.  Reflexes - The patient's reflexes were symmetrical in all extremities and he had no pathological reflexes.  Sensory - Light  touch, temperature/pinprick were assessed and were symmetrical.    Coordination - The patient had normal movements in the hands and feet with no ataxia or dysmetria.  Tremor was absent.  Gait and Station - deferred.   ASSESSMENT/PLAN This is a 60 yo L-handed gentleman with hx DM2, HTN, STEMI 2016, HL, recent admission to [facility] for acute ischemic R MCA stroke s/p thrombectomy for R M1 occlusion. He initially presented to [facility] 12/17/20 with R MCA syndrome NIHSS = 17. He underwent thrombectomy for R M1 occlusion. He improved significantly and was discharged with only residual mild L facial droop and some mild aphasia. He was started on lovenox bridge to warfarin for anticoagulation for LV thrombus. He is currently taking warfarin 5mg  qhs. He presented today from his cardiologist's office where he was found to have an INR>10 and reported a headache. Head CT upon arrival to ED showed new small acute hemorrhages in the right frontal and temporal lobes in the region of his recent evolving R MCA territory stroke. Inferior R frontal lobe shows edema but there is no midline shift. Chronic R frontal and bilateral cerebellar infarcts. Imaging personally reviewed and discussed with radiologist. SBP 130s-140s since admission. No neurologic deficits appreciated on exam.   Hemorrhagic transformation due to supratherapeutic INR CT head new small acute hemorrhagic conversion within the right frontal and temporal lobes in the region of recent right MCA stroke. Repeat CT stable hemorrhagic transformation  INR > 10 -> vitamin K and Kcentra-> 6.6-> 1.2 Currently off antithrombotics Hold off antithrombotics for now, may consider repeat CT in 5 to 7 days.  If hemorrhagic conversion of disorder or near obstruction, will consider DOAC    LV thrombus TTE showed nonmobile LV mural thrombus (approximately 1.2 x 0.5 cm) in region of akinesis involving the apical septal wall.  Cardiac MRI confirmed LV apical thrombus Loop  recorder so far no A. fib 08/2019 TEE showed EF 50 to 55%, no regional wall motion abnormalities, no PFO Hx of CAD s/p stents Discharged 9/20 with Coumadin bridged with Lovenox INR supratherapeutic > 10 -> vitamin K and Kcentra-> 6.6-> 1.2 Cardiology follow-up visit scheduled 01/02/2021 with Dr. Marcelline Deist at [place] off antithrombotics for now, may consider repeat CT in 5 to 7 days.  If hemorrhagic conversion of disorder or near obstruction, will consider DOAC    History of stroke 02/2015, left arm weakness, difficulty walking, MRI showed right motor strip small infarct.  EF 60 to 65%, MRA head and neck right VA occlusion.  A1c 7.2, LDL 57, discharged with DAPT 08/2019 admitted for right superior cerebellum infarct with hemorrhagic transformation.  CT head and neck right VA chronic occlusion.  EF 65 to 70%.  TEE showed EF 50 to 55%, no regional wall motion abnormalities, no PFO.  Loop recorder placed.  LDL 75, A1c 8.7, discharged with DAPT and Lipitor. Followed with Dr. Leonie Man and Janett Billow NP at [facility], loop recorder negative for A. fib. 12/2020 admitted for left-sided weakness and aphasia.  CT no acute abnormality, but old right frontal and bilateral cerebellar infarcts.  Hyperdense right MCA sign.  CTA head and neck showed right M1 occlusion.  Status post IR with TICI2c.  MRI post op right M1 patent.  2D echo showed EF 60 to 65%, but LV thrombus.  Cardiac MRI confirmed LV thrombus.  LDL 49, A1c 6.7.  Discharged with Coumadin in bridge with Lovenox.   Diabetes HgbA1c 6.7, goal < 7.0 controlled CBG monitoring SSI On heart healthy and carb diet DM education and close PCP follow up   Hypertension Home meds including metoprolol Stable Hold off metoprolol for now due to bradycardia Long term BP goal normotensive   Hyperlipidemia Home meds: Lipitor 80 and Zetia 10 LDL 49, goal < 70 Continue home Lipitor 80 and Zetia 10 Continue statin at discharge   Other Stroke Risk Factors Obesity,  BMI 33.25 Coronary artery disease/MI s/p stenting   Other Active Problems CKD IIIA, creatinine 1.25->1.43, encourage p.o. intake.  Hospital day # 0   This patient is critically ill due to acute hemorrhagic conversion, supratherapeutic INR with Coumadin use and at significant risk of neurological worsening, death form worsening hemorrhagic version, ischemia stroke, heart failure. This patient's care requires constant monitoring of vital signs, hemodynamics, respiratory and cardiac monitoring, review of multiple databases, neurological assessment, discussion with family, other specialists and medical decision making of high complexity. I spent 45 minutes of neurocritical care time in the care of this patient. I had long discussion with patient and wife at bedside, updated pt current condition, treatment plan and potential prognosis, and answered all the questions.  They expressed understanding and appreciation.   Rosalin Hawking, MD PhD Stroke Neurology 12/31/2020 12:07 PM     To contact Stroke Continuity provider, please refer to [email or web address] After hours, contact General Neurology

## 2021-01-01 ENCOUNTER — Other Ambulatory Visit: Payer: Self-pay | Admitting: Adult Health

## 2021-01-01 ENCOUNTER — Other Ambulatory Visit: Payer: Self-pay | Admitting: Neurology

## 2021-01-01 DIAGNOSIS — I6389 Other cerebral infarction: Secondary | ICD-10-CM

## 2021-01-01 DIAGNOSIS — I513 Intracardiac thrombosis, not elsewhere classified: Secondary | ICD-10-CM | POA: Diagnosis not present

## 2021-01-01 DIAGNOSIS — I639 Cerebral infarction, unspecified: Secondary | ICD-10-CM

## 2021-01-01 DIAGNOSIS — Z8673 Personal history of transient ischemic attack (TIA), and cerebral infarction without residual deficits: Secondary | ICD-10-CM | POA: Diagnosis not present

## 2021-01-01 LAB — CBC
HCT: 40.1 % (ref 39.0–52.0)
Hemoglobin: 13.5 g/dL (ref 13.0–17.0)
MCH: 31.5 pg (ref 26.0–34.0)
MCHC: 33.7 g/dL (ref 30.0–36.0)
MCV: 93.5 fL (ref 80.0–100.0)
Platelets: 250 10*3/uL (ref 150–400)
RBC: 4.29 MIL/uL (ref 4.22–5.81)
RDW: 11.9 % (ref 11.5–15.5)
WBC: 9.7 10*3/uL (ref 4.0–10.5)
nRBC: 0 % (ref 0.0–0.2)

## 2021-01-01 LAB — GLUCOSE, CAPILLARY: Glucose-Capillary: 149 mg/dL — ABNORMAL HIGH (ref 70–99)

## 2021-01-01 LAB — BASIC METABOLIC PANEL
Anion gap: 9 (ref 5–15)
BUN: 19 mg/dL (ref 8–23)
CO2: 23 mmol/L (ref 22–32)
Calcium: 8.9 mg/dL (ref 8.9–10.3)
Chloride: 106 mmol/L (ref 98–111)
Creatinine, Ser: 1.19 mg/dL (ref 0.61–1.24)
GFR, Estimated: >60 mL/min (ref >60)
Glucose, Bld: 124 mg/dL — ABNORMAL HIGH (ref 70–99)
Potassium: 4 mmol/L (ref 3.5–5.1)
Sodium: 138 mmol/L (ref 135–145)

## 2021-01-01 NOTE — Progress Notes (Signed)
Needs CT head first thing in am  prior to appointment with Dr. Manuella Ghazi same day.

## 2021-01-01 NOTE — Plan of Care (Signed)
  Problem: Education: Goal: Knowledge of General Education information will improve Description: Including pain rating scale, medication(s)/side effects and non-pharmacologic comfort measures Outcome: Adequate for Discharge   Problem: Health Behavior/Discharge Planning: Goal: Ability to manage health-related needs will improve Outcome: Adequate for Discharge   Problem: Clinical Measurements: Goal: Ability to maintain clinical measurements within normal limits will improve Outcome: Adequate for Discharge Goal: Will remain free from infection Outcome: Adequate for Discharge Goal: Diagnostic test results will improve Outcome: Adequate for Discharge Goal: Respiratory complications will improve Outcome: Adequate for Discharge Goal: Cardiovascular complication will be avoided Outcome: Adequate for Discharge   Problem: Coping: Goal: Level of anxiety will decrease Outcome: Adequate for Discharge   Problem: Elimination: Goal: Will not experience complications related to bowel motility Outcome: Adequate for Discharge   Problem: Safety: Goal: Ability to remain free from injury will improve Outcome: Adequate for Discharge   Problem: Skin Integrity: Goal: Risk for impaired skin integrity will decrease Outcome: Adequate for Discharge

## 2021-01-01 NOTE — Progress Notes (Signed)
Pt discharge paperwork reviewed with family present, verbalizes understanding of instructions, upcoming appointments, + medications, including morning medications not given in hospital. Pt A/O x4. Wheeled out by [employer] and leaving by private vehicle with wife.

## 2021-01-01 NOTE — Progress Notes (Signed)
OT Cancellation Note  Patient Details Name: Karl Morgan MRN: [id number] DOB: 28-Jan-1961   Cancelled Treatment:    Reason Eval/Treat Not Completed: OT screened, no needs identified, will sign off.  Per RN and PT, pt does not want hospital based Therapies at this time.  Will sign off.  Nilsa Nutting., OTR/L Acute Rehabilitation Services Pager [phone] Office [phone]   Lucille Passy M 01/01/2021, 9:16 AM

## 2021-01-01 NOTE — Discharge Instructions (Addendum)
DO NOT TAKE WARFARIN (COUMADIN) UNTIL FURTHER NOTICE  DO NOT TAKE ASPIRIN OR ANY OTHER OVER THE COUNTER MEDICATIONS UNTIL FURTHER NOTICE as they may effects on bleeding.  YOU WILL HAVE A FOLLOW HEAD CT ON Thursday 01/05/21. YOU SHOULD BE GETTING A PHONE CALL ABOUT THIS. AFTERWARD, YOU SHOULD HAVE AN APPOINTMENT WITH NEUROLOGIST, DR. [facility].

## 2021-01-01 NOTE — Progress Notes (Signed)
PT Cancellation Note  Patient Details Name: Karl Morgan MRN: [id number] DOB: October 13, 1960   Cancelled Treatment:    Reason Eval/Treat Not Completed: Patient declined, no reason specified Patient refuses therapy services while in hospital. States he continues to be at baseline besides aphasia since CVA last week. Per RN, patient is independent in the room. PT will sign off.   Manning Luna A. Gilford Rile PT, DPT Acute Rehabilitation Services Pager [phone] Office [phone]    Linna Hoff 01/01/2021, 9:03 AM

## 2021-01-01 NOTE — Discharge Summary (Addendum)
Stroke Discharge Summary  Patient ID: Karl Morgan    l   MRN: [id number]      DOB: [birth year]  Date of Admission: 12/31/2020 Date of Discharge: 01/01/2021  Attending Physician:  Dr. Rosalin Hawking,  Stroke MD Patient's PCP:  McLean-Scocuzza, Nino Glow, MD  DISCHARGE DIAGNOSIS:  Hemorrhagic transformation due to supratherapeutic INR   Allergies as of 01/01/2021       Reactions   Other Other (See Comments)   Rybelsus ab pain at 3 mg, 7 mg n/v   Pravachol [pravastatin Sodium] Other (See Comments)   Muscle aches        Medication List     STOP taking these medications    warfarin 5 MG tablet Commonly known as: COUMADIN       TAKE these medications    atorvastatin 80 MG tablet Commonly known as: LIPITOR Take 1 tablet (80 mg total) by mouth daily. Notes to patient: Pt will discuss with Provider 10/3   ezetimibe 10 MG tablet Commonly known as: ZETIA Take 1 tablet (10 mg total) by mouth daily. Notes to patient: Pt to discuss with provider 10/3   Farxiga 10 MG Tabs tablet Generic drug: dapagliflozin propanediol Take 1 tablet (10 mg total) by mouth daily.   glimepiride 2 MG tablet Commonly known as: AMARYL TAKE 1 TABLET (2 MG TOTAL) BY MOUTH 2 (TWO) TIMES DAILY WITH A MEAL. What changed:  how much to take how to take this when to take this   Januvia 100 MG tablet Generic drug: sitaGLIPtin TAKE 1 TABLET (100 MG TOTAL) BY MOUTH DAILY.   levothyroxine 75 MCG tablet Commonly known as: SYNTHROID Take 1 tablet (75 mcg total) by mouth daily.   lisinopril 40 MG tablet Commonly known as: ZESTRIL TAKE 1 TABLET (40 MG TOTAL) BY MOUTH DAILY.   metFORMIN 850 MG tablet Commonly known as: GLUCOPHAGE TAKE 1 TABLET BY MOUTH TWICE DAILY   metoprolol succinate 50 MG 24 hr tablet Commonly known as: TOPROL-XL TAKE 1 TABLET BY MOUTH DAILY. TAKE WITH OR IMMEDIATELY FOLLOWING A MEAL.   pantoprazole 40 MG tablet Commonly known as: PROTONIX Take 1 tablet (40 mg total) by mouth  daily.   valACYclovir 1000 MG tablet Commonly known as: VALTREX take 1 to 2 tablets by mouth every 12 hours for 2 to 3 days as needed for fever blisters        LABORATORY STUDIES CBC    Component Value Date/Time   WBC 9.7 01/01/2021 0335   RBC 4.29 01/01/2021 0335   HGB 13.5 01/01/2021 0335   HGB 14.0 06/24/2013 0611   HCT 40.1 01/01/2021 0335   HCT 40.5 06/24/2013 0611   PLT 250 01/01/2021 0335   PLT 159 06/24/2013 0611   MCV 93.5 01/01/2021 0335   MCV 92 06/24/2013 0611   MCH 31.5 01/01/2021 0335   MCHC 33.7 01/01/2021 0335   RDW 11.9 01/01/2021 0335   RDW 12.8 06/24/2013 0611   LYMPHSABS 1.3 12/30/2020 1300   LYMPHSABS 1.7 06/24/2013 0611   MONOABS 0.6 12/30/2020 1300   MONOABS 1.0 06/24/2013 0611   EOSABS 0.1 12/30/2020 1300   EOSABS 0.1 06/24/2013 0611   BASOSABS 0.0 12/30/2020 1300   BASOSABS 0.0 06/24/2013 0611   CMP    Component Value Date/Time   NA 138 01/01/2021 0335   NA 133 (L) 06/25/2013 0522   K 4.0 01/01/2021 0335   K 3.6 06/25/2013 0522   CL 106 01/01/2021 0335   CL 103 06/25/2013 0522  CO2 23 01/01/2021 0335   CO2 27 06/25/2013 0522   GLUCOSE 124 (H) 01/01/2021 0335   GLUCOSE 142 (H) 06/25/2013 0522   BUN 19 01/01/2021 0335   BUN 17 06/25/2013 0522   CREATININE 1.19 01/01/2021 0335   CREATININE 1.23 08/22/2017 1005   CALCIUM 8.9 01/01/2021 0335   CALCIUM 8.3 (L) 06/25/2013 0522   PROT 6.5 12/30/2020 1300   PROT 7.6 06/23/2013 0920   ALBUMIN 3.5 12/30/2020 1300   ALBUMIN 4.3 06/23/2013 0920   AST 22 12/30/2020 1300   AST 101 (H) 06/23/2013 0920   ALT 23 12/30/2020 1300   ALT 53 06/23/2013 0920   ALKPHOS 51 12/30/2020 1300   ALKPHOS 97 06/23/2013 0920   BILITOT 0.7 12/30/2020 1300   BILITOT 0.8 06/23/2013 0920   GFRNONAA >60 01/01/2021 0335   GFRNONAA 65 08/22/2017 1005   GFRAA >60 11/03/2019 0901   GFRAA 75 08/22/2017 1005   COAGS Lab Results  Component Value Date   INR 1.2 12/30/2020   INR 6.6 (HH) 12/30/2020   INR 1.2  12/20/2020   Lipid Panel    Component Value Date/Time   CHOL 91 12/18/2020 0322   CHOL 109 09/22/2013 0908   TRIG 74 12/18/2020 0322   TRIG 99 09/22/2013 0908   HDL 27 (L) 12/18/2020 0322   HDL 25 (L) 09/22/2013 0908   CHOLHDL 3.4 12/18/2020 0322   VLDL 15 12/18/2020 0322   VLDL 20 09/22/2013 0908   LDLCALC 49 12/18/2020 0322   LDLCALC 71 08/22/2017 1005   LDLCALC 64 09/22/2013 0908   HgbA1C  Lab Results  Component Value Date   HGBA1C 6.7 (H) 12/18/2020   Urinalysis    Component Value Date/Time   COLORURINE STRAW (A) 12/30/2020 1424   APPEARANCEUR CLEAR (A) 12/30/2020 1424   APPEARANCEUR Clear 06/23/2013 1942   LABSPEC 1.018 12/30/2020 1424   LABSPEC 1.023 06/23/2013 1942   PHURINE 6.0 12/30/2020 1424   GLUCOSEU >=500 (A) 12/30/2020 1424   GLUCOSEU >=500 06/23/2013 1942   GLUCOSEU >=1000 12/05/2011 1615   HGBUR NEGATIVE 12/30/2020 1424   [place] 12/30/2020 1424   BILIRUBINUR neg 11/28/2017 1439   KETONESUR NEGATIVE 12/30/2020 1424   PROTEINUR NEGATIVE 12/30/2020 1424   UROBILINOGEN 0.2 11/28/2017 1439   UROBILINOGEN 0.2 12/05/2011 1615   NITRITE NEGATIVE 12/30/2020 1424   LEUKOCYTESUR TRACE (A) 12/30/2020 1424   LEUKOCYTESUR Negative 06/23/2013 1942   Urine Drug Screen     Component Value Date/Time   LABOPIA NONE DETECTED 07/31/2019 2330   COCAINSCRNUR NONE DETECTED 07/31/2019 2330   LABBENZ NONE DETECTED 07/31/2019 2330   AMPHETMU NONE DETECTED 07/31/2019 2330   THCU NONE DETECTED 07/31/2019 2330   LABBARB NONE DETECTED 07/31/2019 2330    Alcohol Level    Component Value Date/Time   ETH <10 07/31/2019 1938     SIGNIFICANT DIAGNOSTIC STUDIES CT Angio Head W or Wo Contrast  Result Date: 12/17/2020 CLINICAL DATA:  Left-sided weakness EXAM: CT ANGIOGRAPHY HEAD AND NECK TECHNIQUE: Multidetector CT imaging of the head and neck was performed using the standard protocol during bolus administration of intravenous contrast. Multiplanar CT image  reconstructions and MIPs were obtained to evaluate the vascular anatomy. Carotid stenosis measurements (when applicable) are obtained utilizing NASCET criteria, using the distal internal carotid diameter as the denominator. CONTRAST:  65mL OMNIPAQUE IOHEXOL 350 MG/ML SOLN COMPARISON:  07/31/2019 FINDINGS: CTA NECK FINDINGS Aortic arch: Standard branching. Imaged portion shows no evidence of aneurysm or dissection. No significant stenosis of the major arch  vessel origins. Right carotid system: Mixed density plaque at the bifurcation without flow limiting stenosis or ulceration. Left carotid system: Mixed density plaque at the bifurcation without flow limiting stenosis or ulceration. Vertebral arteries: No proximal subclavian stenosis. Strong left vertebral artery dominance. Skeleton: Degenerative endplate and facet spurring. Other neck: Negative Upper chest: Coronary calcification. Review of the MIP images confirms the above findings CTA HEAD FINDINGS Anterior circulation: Atheromatous calcification affecting the carotid siphons. Abrupt cut off at the distal right M1 segment with some downstream branch reconstitution. Negative for aneurysm Posterior circulation: Basilar is fed solely by the left vertebral artery. Fetal type bilateral PCA flow. No branch occlusion, beading, or aneurysm. Venous sinuses: Unremarkable for the arterial phase Anatomic variants: As above Review of the MIP images confirms the above findings Critical Value/emergent results were called by telephone at the time of interpretation on 12/17/2020 at 5:24 am to provider [facility] , who verbally acknowledged these results. IMPRESSION: 1. Emergent large vessel occlusion at the right M1 segment. 2. Nonobstructive atherosclerosis of the cervical and intracranial carotids. Electronically Signed   By: Jorje Guild M.D.   On: 12/17/2020 05:30   CT Head Wo Contrast  Result Date: 12/30/2020 CLINICAL DATA:  Stroke follow-up EXAM: CT HEAD WITHOUT  CONTRAST TECHNIQUE: Contiguous axial images were obtained from the base of the skull through the vertex without intravenous contrast. COMPARISON:  Head CT 12/30/2020 FINDINGS: Brain: Decreased conspicuity recently identified areas small volume hemorrhage in the right hemisphere. Hypodensity of the inferior right frontal lobe is unchanged. Unchanged appearance of old bilateral cerebellar infarcts. No midline shift or other mass effect. Vascular: No abnormal hyperdensity of the major intracranial arteries or dural venous sinuses. No intracranial atherosclerosis. Skull: The visualized skull base, calvarium and extracranial soft tissues are normal. Sinuses/Orbits: No fluid levels or advanced mucosal thickening of the visualized paranasal sinuses. No mastoid or middle ear effusion. The orbits are normal. IMPRESSION: 1. Decreased conspicuity of recently identified areas of small volume hemorrhage in the right hemisphere. 2. Unchanged appearance of inferior right frontal lobe infarct. 3. Unchanged appearance of old bilateral cerebellar infarcts. Electronically Signed   By: Ulyses Jarred M.D.   On: 12/30/2020 20:04   CT HEAD WO CONTRAST (5MM)  Result Date: 12/30/2020 CLINICAL DATA:  Headache, uncomplicated (Ped [id number]) Eval for bleeding, mild headache 4 days, INR > 10 with recent stroke. EXAM: CT HEAD WITHOUT CONTRAST TECHNIQUE: Contiguous axial images were obtained from the base of the skull through the vertex without intravenous contrast. COMPARISON:  12/17/2020. FINDINGS: Brain: Evolving infarcts within the right MCA territory. Multiple small acute hemorrhages in the region of evolving infarct in the right frontal and temporal lobes. For example, small hemorrhage in the right frontal lobe (series 3, image 19) and small hemorrhage in the superior right temporal lobe (series 5, image 21). Additional faint acute hemorrhage in the inferior right frontal lobe (series 3, image 14). Edema in the inferior right frontal lobe  is increased without midline shift. Remote right frontal and bilateral cerebellar infarcts. No hydrocephalus. No mass lesion. Vascular: Hyperdense vessel identified. Calcific intracranial atherosclerosis. Skull: No acute fracture. Sinuses/Orbits: Partially imaged right maxillary sinus retention cyst. Unremarkable orbits. Other: No mastoid effusions. IMPRESSION: 1. Multiple new small acute intraparenchymal hemorrhages within the right frontal and temporal lobes in the region of the evolving right MCA territory infarct, detailed above. 2. Edema in the inferior right frontal lobe is increased without midline shift. 3. Chronic right frontal and bilateral cerebellar infarcts. Findings discussed with Dr.  Stack via telephone at 1:57 PM. Also discussed with Dr. Jacqualine Code at 2:00 PM. Electronically Signed   By: Margaretha Sheffield M.D.   On: 12/30/2020 14:10   CT Angio Neck W and/or Wo Contrast  Result Date: 12/17/2020 CLINICAL DATA:  Left-sided weakness EXAM: CT ANGIOGRAPHY HEAD AND NECK TECHNIQUE: Multidetector CT imaging of the head and neck was performed using the standard protocol during bolus administration of intravenous contrast. Multiplanar CT image reconstructions and MIPs were obtained to evaluate the vascular anatomy. Carotid stenosis measurements (when applicable) are obtained utilizing NASCET criteria, using the distal internal carotid diameter as the denominator. CONTRAST:  57mL OMNIPAQUE IOHEXOL 350 MG/ML SOLN COMPARISON:  07/31/2019 FINDINGS: CTA NECK FINDINGS Aortic arch: Standard branching. Imaged portion shows no evidence of aneurysm or dissection. No significant stenosis of the major arch vessel origins. Right carotid system: Mixed density plaque at the bifurcation without flow limiting stenosis or ulceration. Left carotid system: Mixed density plaque at the bifurcation without flow limiting stenosis or ulceration. Vertebral arteries: No proximal subclavian stenosis. Strong left vertebral artery dominance.  Skeleton: Degenerative endplate and facet spurring. Other neck: Negative Upper chest: Coronary calcification. Review of the MIP images confirms the above findings CTA HEAD FINDINGS Anterior circulation: Atheromatous calcification affecting the carotid siphons. Abrupt cut off at the distal right M1 segment with some downstream branch reconstitution. Negative for aneurysm Posterior circulation: Basilar is fed solely by the left vertebral artery. Fetal type bilateral PCA flow. No branch occlusion, beading, or aneurysm. Venous sinuses: Unremarkable for the arterial phase Anatomic variants: As above Review of the MIP images confirms the above findings Critical Value/emergent results were called by telephone at the time of interpretation on 12/17/2020 at 5:24 am to provider [facility] , who verbally acknowledged these results. IMPRESSION: 1. Emergent large vessel occlusion at the right M1 segment. 2. Nonobstructive atherosclerosis of the cervical and intracranial carotids. Electronically Signed   By: Jorje Guild M.D.   On: 12/17/2020 05:30   MR ANGIO HEAD WO CONTRAST  Result Date: 12/17/2020 CLINICAL DATA:  Stroke follow-up. Status post thrombectomy for treatment of M1 occlusion. EXAM: MRI HEAD WITHOUT CONTRAST MRA HEAD WITHOUT CONTRAST TECHNIQUE: Multiplanar, multi-echo pulse sequences of the brain and surrounding structures were acquired without intravenous contrast. Angiographic images of the Circle of Willis were acquired using MRA technique without intravenous contrast. COMPARISON:  Head CT and CTA 12/17/2020.  Head MRI 08/01/2019. FINDINGS: MRI HEAD FINDINGS Brain: There is a small to moderate-sized acute right MCA infarct involving portions of the temporal, frontal, and parietal lobes and insula with associated petechial hemorrhage. A small chronic right frontal cortical infarct is again noted, and there are chronic bilateral cerebellar infarcts with associated chronic blood products. The ventricles are  normal in size. No mass, midline shift, or extra-axial fluid collection is identified. Vascular: More fully evaluated below. Skull and upper cervical spine: Unremarkable bone marrow signal. Sinuses/Orbits: Unremarkable orbits. Mild mucosal thickening in the paranasal sinuses. Small right and trace left mastoid effusions. Other: None. MRA HEAD FINDINGS Anterior circulation: The internal carotid arteries are widely patent from skull base to carotid termini. ACAs and MCAs are patent without evidence of a proximal branch occlusion or significant proximal stenosis. Specifically, the recanalized right M1 segment remains patent without an underlying stenosis. No aneurysm is identified. Posterior circulation: The included intracranial portion of the left vertebral artery is widely patent and supplies the basilar. The right vertebral artery is not visualized and was shown to be hypoplastic on the earlier CTA. The  basilar artery is widely patent. There is a large right posterior communicating artery. 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 No significant proximal PCA stenosis is evident. No aneurysm is identified. Anatomic variants: As above. IMPRESSION: 1. Small to moderate-sized acute right MCA infarct. 2. Chronic right frontal and bilateral cerebellar infarcts. 3. Negative head MRA. Electronically Signed   By: Logan Bores M.D.   On: 12/17/2020 14:55   MR BRAIN WO CONTRAST  Result Date: 12/17/2020 CLINICAL DATA:  Stroke follow-up. Status post thrombectomy for treatment of M1 occlusion. EXAM: MRI HEAD WITHOUT CONTRAST MRA HEAD WITHOUT CONTRAST TECHNIQUE: Multiplanar, multi-echo pulse sequences of the brain and surrounding structures were acquired without intravenous contrast. Angiographic images of the Circle of Willis were acquired using MRA technique without intravenous contrast.  COMPARISON:  Head CT and CTA 12/17/2020.  Head MRI 08/01/2019. FINDINGS: MRI HEAD FINDINGS Brain: There is a small to moderate-sized acute right MCA infarct involving portions of the temporal, frontal, and parietal lobes and insula with associated petechial hemorrhage. A small chronic right frontal cortical infarct is again noted, and there are chronic bilateral cerebellar infarcts with associated chronic blood products. The ventricles are normal in size. No mass, midline shift, or extra-axial fluid collection is identified. Vascular: More fully evaluated below. Skull and upper cervical spine: Unremarkable bone marrow signal. Sinuses/Orbits: Unremarkable orbits. Mild mucosal thickening in the paranasal sinuses. Small right and trace left mastoid effusions. Other: None. MRA HEAD FINDINGS Anterior circulation: The internal carotid arteries are widely patent from skull base to carotid termini. ACAs and MCAs are patent without evidence of a proximal branch occlusion or significant proximal stenosis. Specifically, the recanalized right M1 segment remains patent without an underlying stenosis. No aneurysm is identified. Posterior circulation: The included intracranial portion of the left vertebral artery is widely patent and supplies the basilar. The right vertebral artery is not visualized and was shown to be hypoplastic on the earlier CTA. The basilar artery is widely patent. There is a large right posterior communicating artery. 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 No significant proximal PCA stenosis is evident. No aneurysm is identified. Anatomic variants: As above. IMPRESSION: 1. Small to moderate-sized acute right MCA infarct. 2. Chronic right frontal and bilateral cerebellar infarcts. 3. Negative head MRA. Electronically Signed   By: Logan Bores M.D.   On: 12/17/2020  14:55   IR CT Head Ltd  Result Date: 12/19/2020 INDICATION: 60 year old male with a history acute stroke right M1/right MCA syndrome, presents for angiogram and possible mechanical thrombectomy EXAM: ULTRASOUND-GUIDED ACCESS RIGHT COMMON FEMORAL ARTERY CERVICAL AND CEREBRAL ANGIOGRAM MECHANICAL THROMBECTOMY RIGHT MCA FLAT PANEL CT ANGIO-SEAL FOR HEMOSTASIS COMPARISON:  CT IMAGING SAME DAY MEDICATIONS: None ANESTHESIA/SEDATION: The anesthesia team was present to provide general endotracheal tube anesthesia and for patient monitoring during the procedure. Intubation was performed in room 2 neuro IR. Left radial arterial line was performed by the anesthesia team. Interventional neuro radiology nursing staff was also present. CONTRAST:  90 cc Omni 240 FLUOROSCOPY TIME:  Fluoroscopy Time: 19 minutes 6 seconds (1588 mGy). COMPLICATIONS: SIR LEVEL B - Normal therapy, includes overnight admission for observation. Subarachnoid hemorrhage TECHNIQUE: Informed written consent was obtained from the patient's family after a thorough discussion of the procedural risks, benefits and alternatives.  Specific risks discussed include: Bleeding, infection, contrast reaction, kidney injury/failure, need for further procedure/surgery, arterial injury or dissection, embolization to new territory, intracranial hemorrhage (10-15% risk), neurologic deterioration, cardiopulmonary collapse, death. All questions were addressed. Maximal Sterile Barrier Technique was utilized including during the procedure including caps, mask, sterile gowns, sterile gloves, sterile drape, hand hygiene and skin antiseptic. A timeout was performed prior to the initiation of the procedure. The anesthesia team was present to provide general endotracheal tube anesthesia and for patient monitoring during the procedure. Interventional neuro radiology nursing staff was also present. FINDINGS: Initial Findings: Right common carotid artery:  Normal course caliber and  contour. Right external carotid artery: Patent with antegrade flow. Right internal carotid artery: Normal course caliber and contour of the cervical portion. Vertical and petrous segment patent with normal course caliber contour. Cavernous segment patent. Clinoid segment patent. Antegrade flow of the ophthalmic artery. Ophthalmic segment patent. Terminus patent. Right MCA: Occlusion was present at the branch point of superior and inferior. There had been migration of the M1 thrombus distally to the branch point, when compared to the baseline CT angiogram. The embolus had moved primarily into the inferior division, with flow maintained into the superior division on the initial angiogram. Right ACA: A 1 segment patent. Full perfusion of the right ACA territory with crossover flow into the left ACA territory via patent anterior communicating artery. Completion Findings: Right MCA: Restoration of flow through the right MCA, into the inferior and superior division branch point. The fragmented embolus entered the inferior division. The inferior division is the dominant division, with perfusion of the temporal lobe and the parietal lobe, with the superior segment perfusing essentially the frontal region only. After treatment, there is restoration of the proximal flow, with slow flow within distal branches of the inferior division, affecting both parietal and temporal lobes, with TICI 2c flow: Near complete perfusion except for slow flow in a few distal cortical vessels/presence of small distal cortical emboli Left: Left common carotid artery:  Normal course caliber and contour. Left external carotid artery: Patent with antegrade flow. Left internal carotid artery: Normal course caliber and contour of the cervical portion. Mild atherosclerotic plaque at the origin of the left ICA without significant stenosis. Vertical and petrous segment patent with normal course caliber contour. Cavernous segment patent. Clinoid segment  patent. Antegrade flow of the ophthalmic artery. Ophthalmic segment patent. Terminus patent. Left MCA: M1 segment patent. Insular and opercular segments patent. Unremarkable caliber and course of the cortical segments. Typical arterial, capillary/ parenchymal, and venous phase. Left ACA: A 1 segment patent. A 2 segment perfuses the right ACA territory. Flat panel CT: Small volume subarachnoid hemorrhage within the right sulci, [place] IIIC PROCEDURE: The anesthesia team was present to provide general endotracheal tube anesthesia and for patient monitoring during the procedure. Intubation was performed in negative pressure [place] in neuro IR holding. Interventional neuro radiology nursing staff was also present. Ultrasound survey of the right inguinal region was performed with images stored and sent to PACs. 11 blade scalpel was used to make a small incision. Blunt dissection was performed with US guidance. A micropuncture needle was used access the right common femoral artery under ultrasound. With excellent arterial blood flow returned, an .018 micro wire was passed through the needle, observed to enter the abdominal aorta under fluoroscopy. The needle was removed, and a micropuncture sheath was placed over the wire. The inner dilator and wire were removed, and an 035 wire was advanced under fluoroscopy into the abdominal  aorta. The sheath was removed and a 25cm 35F straight vascular sheath was placed. The dilator was removed and the sheath was flushed. Sheath was attached to pressurized and heparinized saline bag for constant forward flow. A coaxial system was then advanced over the 035 wire. This included a 95cm 087 "Walrus" balloon guide with coaxial 125cm Berenstein diagnostic catheter. This was advanced to the proximal descending thoracic aorta. Wire was then removed. Double flush of the catheter was performed. Catheter was then used to select the innominate artery. Angiogram was performed. Using roadmap  technique, the catheter was advanced over a standard glide wire into right cervical ICA, with distal position achieved of the balloon guide. The diagnostic catheter and the wire were removed. Formal angiogram was performed. Road map function was used once the occluded vessel was identified. Copious back flush was performed and the balloon catheter was attached to heparinized and pressurized saline bag for forward flow. A second coaxial system was then advanced through the balloon catheter, which included the selected intermediate catheter, microcatheter, and microwire. In this scenario, the set up included a zoom 71 aspiration catheter, a Trevo Provue18 microcatheter, and 014 synchro soft wire. This system was advanced through the balloon guide catheter under the road-map function, with adequate back-flush at the rotating hemostatic valve at that back end of the balloon guide. Once the wire and microcatheter were into the laceral segment, we attach heparinized saline pressured flush to the guide. Microcatheter and the intermediate catheter system were advanced through the terminal ICA and MCA gently to the proximal M1 segment, and were advanced up to but not through the occluded segment. The zoom catheter was then advanced over the microwire into the proximal M1 segment. The microcatheter microwire were then removed. The proprietary engine was attached to the hub of the aspiration catheter confirming return of flow. Catheter was then gently advanced into the embolus/thrombus at the site of occlusion. Approximately 1 minute of time was observed and then the catheter was withdrawn. Catheter was withdrawn into the balloon guide with free aspiration performed at the hub of the aspiration catheter. Angiogram was performed, which confirmed restoration of flow through the proximal inferior segment, with distal migration of the thrombus into a distal branch. We elected to attempt a second pass. 2nd pass: Coaxial system was  advanced through the balloon guide, which included a zoom 71 aspiration catheter, a Trevo Provue18 microcatheter, and 014 synchro soft wire. The system was advanced through the balloon guide catheter under the road-map function, with adequate back-flush at the rotating hemostatic valve at that back end of the balloon guide. Microcatheter and the intermediate catheter system were advanced through the terminal ICA and MCA to the level of the bifurcation of superior/inferior division. The micro wire was then carefully advanced into the inferior segment and through the occluded segment. Microcatheter was then manipulated through the occluded segment and the wire was removed with saline drip at the hub. Blood was then aspirated through the hub of the microcatheter, and a gentle contrast injection was performed confirming intraluminal position within the inferior division. A rotating hemostatic valve was then attached to the back end of the microcatheter, and a pressurized and heparinized saline bag was attached to the catheter. 4 x 40 solitaire device was then selected. Back flush was achieved at the rotating hemostatic valve, and then the device was gently advanced through the microcatheter to the distal end. The retriever was then unsheathed by withdrawing the microcatheter under fluoroscopy. Once the retriever was  completely unsheathed, the microcatheter was carefully stripped from the delivery device. Control angiogram was performed from the intermediate catheter. A 3 minute time interval was observed. Constant aspiration using the proprietary engine was then performed at the intermediate catheter, as the retriever was gently and slowly withdrawn with fluoroscopic observation. Once the retriever was "corked" within the tip of the intermediate catheter, both were removed from the system. Free aspiration was confirmed at the hub of the balloon guide catheter, with free blood return confirmed. The balloon was then  deflated, and a control angiogram was performed. Improved flow, with again distal migration within the inferior division. We elected to attempt a third pass. 3rd pass: The 014 synchro soft wire was loaded through a zoom 35 aspiration catheter. Roadmap angiogram was performed. The aspiration catheter and the microwire were then advanced to the inferior segment, just proximal to the occlusion site. Once the aspiration catheter was just proximal to the occlusion site the microwire was removed. The rotating hemostatic valve was then removed from the hub, and the proprietary engine was attached directly to the hub of the zoom 35 catheter the pump was turned on confirming backflow and the catheter was advanced until cessation of flow was observed. Catheter was advanced through the site of the occlusion and then withdrawn. Upon withdrawal, aspiration was performed at the hub of the balloon guide catheter. Once the aspiration catheter was completely removed, aspiration was performed at the hub of the balloon guide confirming free return of blood. Angiogram was performed. There was improved flow through the site of the occlusion, however, again distal migration was observed within the affected division, with again distal migration within the inferior division. We elected 1 final pass into the inferior division M3/M4 with the microcatheter. Fourth pass: A roadmap angiogram was performed. Through the balloon guide, a 160 cm Trevo Trak microcatheter was advanced with the synchro soft, into the distal aspect of the inferior division, targeting the residual, most proximal occlusive embolus of the inferior division branches. Once the microcatheter was distal to the site of the occlusion, the microwire was removed. Catheter aspiration was performed as the catheter was then withdrawn. Catheter was completely withdrawn from the system. Angiogram performed demonstrated persistent occlusion in the distal M3/M4 cortical branch of the  inferior division. At this time we elected to terminate the case. Balloon guide was withdrawn. A JB 1 catheter was used to select the origin of the left CCA. Angiogram was performed. JB 1 catheter was then removed. The skin at the puncture site was then cleaned with Chlorhexidine. The 8 French sheath was removed and an 63F angioseal was deployed. Flat panel CT was performed. Patient was extubated once the CT was reviewed. Patient tolerated the procedure well and remained hemodynamically stable throughout. No complications were encountered and no significant blood loss encountered. IMPRESSION: Status post ultrasound guided access right common femoral artery for mechanical thrombectomy of right-sided MCA ELVO, with 4 passes achieving TICI 2c flow at the completion. Angiogram of the left ICA demonstrates no occlusive branches intracranial. Angio-Seal restored for hemostasis. Signed, Dulcy Fanny. Dellia Nims, RPVI [facility] Inter[facility] Radiology Specialists [facility] Radiology PLAN: Patient extubated ICU status Target systolic blood pressure of 120-140 Right hip straight time 4 hours Frequent neurovascular checks Repeat neurologic imaging with CT and/MRI at the discretion of neurology team Electronically Signed   By: Corrie Mckusick D.O.   On: 12/18/2020 21:30   IR US Guide Vasc Access Right  Result Date: 12/19/2020 INDICATION: 60 year old male with  a history acute stroke right M1/right MCA syndrome, presents for angiogram and possible mechanical thrombectomy EXAM: ULTRASOUND-GUIDED ACCESS RIGHT COMMON FEMORAL ARTERY CERVICAL AND CEREBRAL ANGIOGRAM MECHANICAL THROMBECTOMY RIGHT MCA FLAT PANEL CT ANGIO-SEAL FOR HEMOSTASIS COMPARISON:  CT IMAGING SAME DAY MEDICATIONS: None ANESTHESIA/SEDATION: The anesthesia team was present to provide general endotracheal tube anesthesia and for patient monitoring during the procedure. Intubation was performed in room 2 neuro IR. Left radial arterial line was performed by the  anesthesia team. Interventional neuro radiology nursing staff was also present. CONTRAST:  90 cc Omni 240 FLUOROSCOPY TIME:  Fluoroscopy Time: 19 minutes 6 seconds (1588 mGy). COMPLICATIONS: SIR LEVEL B - Normal therapy, includes overnight admission for observation. Subarachnoid hemorrhage TECHNIQUE: Informed written consent was obtained from the patient's family after a thorough discussion of the procedural risks, benefits and alternatives. Specific risks discussed include: Bleeding, infection, contrast reaction, kidney injury/failure, need for further procedure/surgery, arterial injury or dissection, embolization to new territory, intracranial hemorrhage (10-15% risk), neurologic deterioration, cardiopulmonary collapse, death. All questions were addressed. Maximal Sterile Barrier Technique was utilized including during the procedure including caps, mask, sterile gowns, sterile gloves, sterile drape, hand hygiene and skin antiseptic. A timeout was performed prior to the initiation of the procedure. The anesthesia team was present to provide general endotracheal tube anesthesia and for patient monitoring during the procedure. Interventional neuro radiology nursing staff was also present. FINDINGS: Initial Findings: Right common carotid artery:  Normal course caliber and contour. Right external carotid artery: Patent with antegrade flow. Right internal carotid artery: Normal course caliber and contour of the cervical portion. Vertical and petrous segment patent with normal course caliber contour. Cavernous segment patent. Clinoid segment patent. Antegrade flow of the ophthalmic artery. Ophthalmic segment patent. Terminus patent. Right MCA: Occlusion was present at the branch point of superior and inferior. There had been migration of the M1 thrombus distally to the branch point, when compared to the baseline CT angiogram. The embolus had moved primarily into the inferior division, with flow maintained into the  superior division on the initial angiogram. Right ACA: A 1 segment patent. Full perfusion of the right ACA territory with crossover flow into the left ACA territory via patent anterior communicating artery. Completion Findings: Right MCA: Restoration of flow through the right MCA, into the inferior and superior division branch point. The fragmented embolus entered the inferior division. The inferior division is the dominant division, with perfusion of the temporal lobe and the parietal lobe, with the superior segment perfusing essentially the frontal region only. After treatment, there is restoration of the proximal flow, with slow flow within distal branches of the inferior division, affecting both parietal and temporal lobes, with TICI 2c flow: Near complete perfusion except for slow flow in a few distal cortical vessels/presence of small distal cortical emboli Left: Left common carotid artery:  Normal course caliber and contour. Left external carotid artery: Patent with antegrade flow. Left internal carotid artery: Normal course caliber and contour of the cervical portion. Mild atherosclerotic plaque at the origin of the left ICA without significant stenosis. Vertical and petrous segment patent with normal course caliber contour. Cavernous segment patent. Clinoid segment patent. Antegrade flow of the ophthalmic artery. Ophthalmic segment patent. Terminus patent. Left MCA: M1 segment patent. Insular and opercular segments patent. Unremarkable caliber and course of the cortical segments. Typical arterial, capillary/ parenchymal, and venous phase. Left ACA: A 1 segment patent. A 2 segment perfuses the right ACA territory. Flat panel CT: Small volume subarachnoid hemorrhage within the right  sulci, [place] IIIC PROCEDURE: The anesthesia team was present to provide general endotracheal tube anesthesia and for patient monitoring during the procedure. Intubation was performed in negative pressure [place] in neuro IR  holding. Interventional neuro radiology nursing staff was also present. Ultrasound survey of the right inguinal region was performed with images stored and sent to PACs. 11 blade scalpel was used to make a small incision. Blunt dissection was performed with US guidance. A micropuncture needle was used access the right common femoral artery under ultrasound. With excellent arterial blood flow returned, an .018 micro wire was passed through the needle, observed to enter the abdominal aorta under fluoroscopy. The needle was removed, and a micropuncture sheath was placed over the wire. The inner dilator and wire were removed, and an 035 wire was advanced under fluoroscopy into the abdominal aorta. The sheath was removed and a 25cm 48F straight vascular sheath was placed. The dilator was removed and the sheath was flushed. Sheath was attached to pressurized and heparinized saline bag for constant forward flow. A coaxial system was then advanced over the 035 wire. This included a 95cm 087 "[place]" balloon guide with coaxial 125cm Berenstein diagnostic catheter. This was advanced to the proximal descending thoracic aorta. Wire was then removed. Double flush of the catheter was performed. Catheter was then used to select the innominate artery. Angiogram was performed. Using roadmap technique, the catheter was advanced over a standard glide wire into right cervical ICA, with distal position achieved of the balloon guide. The diagnostic catheter and the wire were removed. Formal angiogram was performed. Road map function was used once the occluded vessel was identified. Copious back flush was performed and the balloon catheter was attached to heparinized and pressurized saline bag for forward flow. A second coaxial system was then advanced through the balloon catheter, which included the selected intermediate catheter, microcatheter, and microwire. In this scenario, the set up included a zoom 71 aspiration catheter, a Trevo  Provue18 microcatheter, and 014 synchro soft wire. This system was advanced through the balloon guide catheter under the road-map function, with adequate back-flush at the rotating hemostatic valve at that back end of the balloon guide. Once the wire and microcatheter were into the laceral segment, we attach heparinized saline pressured flush to the guide. Microcatheter and the intermediate catheter system were advanced through the terminal ICA and MCA gently to the proximal M1 segment, and were advanced up to but not through the occluded segment. The zoom catheter was then advanced over the microwire into the proximal M1 segment. The microcatheter microwire were then removed. The proprietary engine was attached to the hub of the aspiration catheter confirming return of flow. Catheter was then gently advanced into the embolus/thrombus at the site of occlusion. Approximately 1 minute of time was observed and then the catheter was withdrawn. Catheter was withdrawn into the balloon guide with free aspiration performed at the hub of the aspiration catheter. Angiogram was performed, which confirmed restoration of flow through the proximal inferior segment, with distal migration of the thrombus into a distal branch. We elected to attempt a second pass. 2nd pass: Coaxial system was advanced through the balloon guide, which included a zoom 71 aspiration catheter, a Trevo Provue18 microcatheter, and 014 synchro soft wire. The system was advanced through the balloon guide catheter under the road-map function, with adequate back-flush at the rotating hemostatic valve at that back end of the balloon guide. Microcatheter and the intermediate catheter system were advanced through the terminal ICA and  MCA to the level of the bifurcation of superior/inferior division. The micro wire was then carefully advanced into the inferior segment and through the occluded segment. Microcatheter was then manipulated through the occluded segment  and the wire was removed with saline drip at the hub. Blood was then aspirated through the hub of the microcatheter, and a gentle contrast injection was performed confirming intraluminal position within the inferior division. A rotating hemostatic valve was then attached to the back end of the microcatheter, and a pressurized and heparinized saline bag was attached to the catheter. 4 x 40 solitaire device was then selected. Back flush was achieved at the rotating hemostatic valve, and then the device was gently advanced through the microcatheter to the distal end. The retriever was then unsheathed by withdrawing the microcatheter under fluoroscopy. Once the retriever was completely unsheathed, the microcatheter was carefully stripped from the delivery device. Control angiogram was performed from the intermediate catheter. A 3 minute time interval was observed. Constant aspiration using the proprietary engine was then performed at the intermediate catheter, as the retriever was gently and slowly withdrawn with fluoroscopic observation. Once the retriever was "corked" within the tip of the intermediate catheter, both were removed from the system. Free aspiration was confirmed at the hub of the balloon guide catheter, with free blood return confirmed. The balloon was then deflated, and a control angiogram was performed. Improved flow, with again distal migration within the inferior division. We elected to attempt a third pass. 3rd pass: The 014 synchro soft wire was loaded through a zoom 35 aspiration catheter. Roadmap angiogram was performed. The aspiration catheter and the microwire were then advanced to the inferior segment, just proximal to the occlusion site. Once the aspiration catheter was just proximal to the occlusion site the microwire was removed. The rotating hemostatic valve was then removed from the hub, and the proprietary engine was attached directly to the hub of the zoom 35 catheter the pump was  turned on confirming backflow and the catheter was advanced until cessation of flow was observed. Catheter was advanced through the site of the occlusion and then withdrawn. Upon withdrawal, aspiration was performed at the hub of the balloon guide catheter. Once the aspiration catheter was completely removed, aspiration was performed at the hub of the balloon guide confirming free return of blood. Angiogram was performed. There was improved flow through the site of the occlusion, however, again distal migration was observed within the affected division, with again distal migration within the inferior division. We elected 1 final pass into the inferior division M3/M4 with the microcatheter. Fourth pass: A roadmap angiogram was performed. Through the balloon guide, a 160 cm Trevo Trak microcatheter was advanced with the synchro soft, into the distal aspect of the inferior division, targeting the residual, most proximal occlusive embolus of the inferior division branches. Once the microcatheter was distal to the site of the occlusion, the microwire was removed. Catheter aspiration was performed as the catheter was then withdrawn. Catheter was completely withdrawn from the system. Angiogram performed demonstrated persistent occlusion in the distal M3/M4 cortical branch of the inferior division. At this time we elected to terminate the case. Balloon guide was withdrawn. A JB 1 catheter was used to select the origin of the left CCA. Angiogram was performed. JB 1 catheter was then removed. The skin at the puncture site was then cleaned with Chlorhexidine. The 8 French sheath was removed and an 1F angioseal was deployed. Flat panel CT was performed. Patient was extubated  once the CT was reviewed. Patient tolerated the procedure well and remained hemodynamically stable throughout. No complications were encountered and no significant blood loss encountered. IMPRESSION: Status post ultrasound guided access right common femoral  artery for mechanical thrombectomy of right-sided MCA ELVO, with 4 passes achieving TICI 2c flow at the completion. Angiogram of the left ICA demonstrates no occlusive branches intracranial. Angio-Seal restored for hemostasis. Signed, Dulcy Fanny. Dellia Nims, RPVI Vascular and Interventional Radiology Specialists [facility] Radiology PLAN: Patient extubated ICU status Target systolic blood pressure of 120-140 Right hip straight time 4 hours Frequent neurovascular checks Repeat neurologic imaging with CT and/MRI at the discretion of neurology team Electronically Signed   By: Corrie Mckusick D.O.   On: 12/18/2020 21:30   MR CARDIAC MORPHOLOGY W WO CONTRAST  Result Date: 12/19/2020 CLINICAL DATA:  Evaluate for LV thrombus EXAM: CARDIAC MRI TECHNIQUE: The patient was scanned on a 1.5 Tesla Siemens magnet. A dedicated cardiac coil was used. Functional imaging was done using Fiesta sequences. 2,3, and 4 chamber views were done to assess for RWMA's. Modified Simpson's rule using a short axis stack was used to calculate an ejection fraction on a dedicated work Conservation officer, nature. The patient received 14 cc of Gadavist. After 10 minutes inversion recovery sequences were used to assess for infiltration and scar tissue. CONTRAST:  14 cc  of Gadavist FINDINGS: Left ventricle: -Normal size -Low normal systolic function.  Akinesis of apex -LV apical thrombus measuring 42mm x 8mm -Mild nonspecific ECV elevation (30%) -Subendocardial LGE in mid to apical anteroseptum as well as part of the mid to apical anterior wall and the apical inferior wall and apex. LGE >50% transmural at apex, suggesting nonviability. Remainder of myocardium appears viable. LV EF: 51% (Normal 56-78%) Absolute volumes: LV EDV: 127mL (Normal 77-195 mL) LV ESV: 59mL (Normal 19-72 mL) LV SV: 47mL (Normal 51-133 mL) CO: 4.3L/min (Normal 2.8-8.8 L/min) Indexed volumes: LV EDV: 79mL/sq-m (Normal 47-92 mL/sq-m) LV ESV: 58mL/sq-m (Normal 13-30 mL/sq-m) LV  SV: 76mL/sq-m (Normal 32-62 mL/sq-m) CI: 1.8L/min/sq-m (Normal 1.7-4.2 L/min/sq-m) Right ventricle: Normal size and systolic function RV EF:  59% (Normal 47-74%) Absolute volumes: RV EDV: 127mL (Normal 88-227 mL) RV ESV: 86mL (Normal 23-103 mL) RV SV: 23mL (Normal 52-138 mL) CO: 4.6L/min (Normal 2.8-8.8 L/min) Indexed volumes: RV EDV: 38mL/sq-m (Normal 55-105 mL/sq-m) RV ESV: 93mL/sq-m (Normal 15-43 mL/sq-m) RV SV: 68mL/sq-m (Normal 32-64 mL/sq-m) CI: 2.0L/min/sq-m (Normal 1.7-4.2 L/min/sq-m) Left atrium: Normal size Right atrium: Normal size Mitral valve: Trivial regurgitation Aortic valve: No regurgitation Tricuspid valve: No regurgitation Pulmonic valve: No regurgitation Aorta: Normal proximal ascending aorta Coronary arteries: Normal origins Pericardium: Normal IMPRESSION: 1. LV apical thrombus measuring 33mm x 55mm 2. Subendocardial late gadolinium enhancement consistent with prior infarct in part of the mid to apical anterior wall, mid anteroseptal wall, apical septal/inferior walls, and apex. LGE is less than 50% transmural in the mid anteroseptal/anterior walls and apical septal/anterior/inferior walls, suggesting viability. LGE is greater than 50% transmural at the apex, suggesting apex is not viable. 3. Normal LV size with low normal systolic function (EF 73%). Akinesis of apex. 4.  Normal RV size and systolic function (EF 42%) Electronically Signed   By: Oswaldo Milian M.D.   On: 12/19/2020 19:05   ECHOCARDIOGRAM COMPLETE  Result Date: 12/17/2020    ECHOCARDIOGRAM REPORT   Patient Name:   Karl Morgan Date of Exam: 12/17/2020 Medical Rec #:  [id number]      Height:       72.0 in  Accession #:    [id number]     Weight:       245.1 lb Date of Birth:  1961-02-20      BSA:          2.323 m Patient Age:    32 years       BP:           122/72 mmHg Patient Gender: M              HR:           72 bpm. Exam Location:  Inpatient Procedure: 2D Echo, Cardiac Doppler and Color Doppler Indications:    Stroke  434.91 / I163.9  History:        Patient has prior history of Echocardiogram examinations, most                 recent 08/03/2019. Previous Myocardial Infarction, Stroke, Mitral                 Valve Disease; Risk Factors:Hypertension, Diabetes and                 Dyslipidemia. Substance abuse ([place]) (From Hx).  Sonographer:    Leavy Cella RDCS Sonographer#2:  Alvino Chapel RCS Referring Phys: [phone] MCNEILL P KIRKPATRICK IMPRESSIONS  1. Definity contrast utilized. There is a nonmobile LV mural thrombus (approximately 1.2 x 0.5 cm) in region of akinesis involving the apical septal wall. Reported to covering team at 5:10 PM.  2. Left ventricular ejection fraction, by estimation, is 60 to 65%. The left ventricle has normal function. The left ventricle demonstrates regional wall motion abnormalities (see scoring diagram/findings for description). There is mild left ventricular  hypertrophy. Left ventricular diastolic parameters are indeterminate.  3. Right ventricular systolic function is normal. The right ventricular size is normal. Tricuspid regurgitation signal is inadequate for assessing PA pressure.  4. The mitral valve is grossly normal. Trivial mitral valve regurgitation.  5. The aortic valve is tricuspid. Aortic valve regurgitation is not visualized.  6. The inferior vena cava is normal in size with greater than 50% respiratory variability, suggesting right atrial pressure of 3 mmHg. Comparison(s): Prior images reviewed side by side. LV mural thrombus newly identified. Prior study from May 2021 did have similar apical septal wall motion abnormality. FINDINGS  Left Ventricle: Left ventricular ejection fraction, by estimation, is 60 to 65%. The left ventricle has normal function. The left ventricle demonstrates regional wall motion abnormalities. Definity contrast agent was given IV to delineate the left ventricular endocardial borders. The left ventricular internal cavity size was normal in size. There is mild  left ventricular hypertrophy. Left ventricular diastolic parameters are indeterminate.  LV Wall Scoring: The apical septal segment and apex are akinetic. The apical anterior segment is hypokinetic. The anterior wall, entire lateral wall, anterior septum, entire inferior wall, mid inferoseptal segment, and basal inferoseptal segment are normal. Right Ventricle: The right ventricular size is normal. No increase in right ventricular wall thickness. Right ventricular systolic function is normal. Tricuspid regurgitation signal is inadequate for assessing PA pressure. Left Atrium: Left atrial size was normal in size. Right Atrium: Right atrial size was normal in size. Pericardium: There is no evidence of pericardial effusion. Presence of pericardial fat pad. Mitral Valve: The mitral valve is grossly normal. Trivial mitral valve regurgitation. Tricuspid Valve: The tricuspid valve is grossly normal. Tricuspid valve regurgitation is trivial. Aortic Valve: The aortic valve is tricuspid. There is mild aortic valve annular calcification. Aortic valve regurgitation is  not visualized. Pulmonic Valve: The pulmonic valve was not well visualized. Pulmonic valve regurgitation is not visualized. Aorta: The aortic root is normal in size and structure. Venous: The inferior vena cava is normal in size with greater than 50% respiratory variability, suggesting right atrial pressure of 3 mmHg. IAS/Shunts: No atrial level shunt detected by color flow Doppler.  LEFT VENTRICLE PLAX 2D LVIDd:         5.40 cm  Diastology LVIDs:         3.50 cm  LV e' medial:    6.85 cm/s LV PW:         1.10 cm  LV E/e' medial:  8.9 LV IVS:        1.10 cm  LV e' lateral:   7.83 cm/s LVOT diam:     2.00 cm  LV E/e' lateral: 7.8 LVOT Area:     3.14 cm  RIGHT VENTRICLE RV S prime:     15.90 cm/s TAPSE (M-mode): 2.4 cm LEFT ATRIUM             Index       RIGHT ATRIUM          Index LA diam:        3.50 cm 1.51 cm/m  RA Area:     9.85 cm LA Vol (A2C):   48.8 ml  21.01 ml/m RA Volume:   21.30 ml 9.17 ml/m LA Vol (A4C):   18.1 ml 7.79 ml/m LA Biplane Vol: 30.1 ml 12.96 ml/m   AORTA Ao Root diam: 2.80 cm MITRAL VALVE MV Area (PHT): 3.08 cm    SHUNTS MV Decel Time: 246 msec    Systemic Diam: 2.00 cm MV E velocity: 60.80 cm/s MV A velocity: 78.60 cm/s MV E/A ratio:  0.77 Rozann Lesches MD Electronically signed by Rozann Lesches MD Signature Date/Time: 12/17/2020/5:19:12 PM    Final    CUP PACEART REMOTE DEVICE CHECK  Result Date: 12/14/2020 ILR summary report received. Battery status OK. Normal device function. No new symptom, tachy, brady, or pause episodes. No new AF episodes. Monthly summary reports and ROV/PRN  IR PERCUTANEOUS ART THROMBECTOMY/INFUSION INTRACRANIAL INC DIAG ANGIO  Result Date: 12/19/2020 INDICATION: 60 year old male with a history acute stroke right M1/right MCA syndrome, presents for angiogram and possible mechanical thrombectomy EXAM: ULTRASOUND-GUIDED ACCESS RIGHT COMMON FEMORAL ARTERY CERVICAL AND CEREBRAL ANGIOGRAM MECHANICAL THROMBECTOMY RIGHT MCA FLAT PANEL CT ANGIO-SEAL FOR HEMOSTASIS COMPARISON:  CT IMAGING SAME DAY MEDICATIONS: None ANESTHESIA/SEDATION: The anesthesia team was present to provide general endotracheal tube anesthesia and for patient monitoring during the procedure. Intubation was performed in room 2 neuro IR. Left radial arterial line was performed by the anesthesia team. Interventional neuro radiology nursing staff was also present. CONTRAST:  90 cc Omni 240 FLUOROSCOPY TIME:  Fluoroscopy Time: 19 minutes 6 seconds (1588 mGy). COMPLICATIONS: SIR LEVEL B - Normal therapy, includes overnight admission for observation. Subarachnoid hemorrhage TECHNIQUE: Informed written consent was obtained from the patient's family after a thorough discussion of the procedural risks, benefits and alternatives. Specific risks discussed include: Bleeding, infection, contrast reaction, kidney injury/failure, need for further  procedure/surgery, arterial injury or dissection, embolization to new territory, intracranial hemorrhage (10-15% risk), neurologic deterioration, cardiopulmonary collapse, death. All questions were addressed. Maximal Sterile Barrier Technique was utilized including during the procedure including caps, mask, sterile gowns, sterile gloves, sterile drape, hand hygiene and skin antiseptic. A timeout was performed prior to the initiation of the procedure. The anesthesia team was present to provide general endotracheal  tube anesthesia and for patient monitoring during the procedure. Interventional neuro radiology nursing staff was also present. FINDINGS: Initial Findings: Right common carotid artery:  Normal course caliber and contour. Right external carotid artery: Patent with antegrade flow. Right internal carotid artery: Normal course caliber and contour of the cervical portion. Vertical and petrous segment patent with normal course caliber contour. Cavernous segment patent. Clinoid segment patent. Antegrade flow of the ophthalmic artery. Ophthalmic segment patent. Terminus patent. Right MCA: Occlusion was present at the branch point of superior and inferior. There had been migration of the M1 thrombus distally to the branch point, when compared to the baseline CT angiogram. The embolus had moved primarily into the inferior division, with flow maintained into the superior division on the initial angiogram. Right ACA: A 1 segment patent. Full perfusion of the right ACA territory with crossover flow into the left ACA territory via patent anterior communicating artery. Completion Findings: Right MCA: Restoration of flow through the right MCA, into the inferior and superior division branch point. The fragmented embolus entered the inferior division. The inferior division is the dominant division, with perfusion of the temporal lobe and the parietal lobe, with the superior segment perfusing essentially the frontal region  only. After treatment, there is restoration of the proximal flow, with slow flow within distal branches of the inferior division, affecting both parietal and temporal lobes, with TICI 2c flow: Near complete perfusion except for slow flow in a few distal cortical vessels/presence of small distal cortical emboli Left: Left common carotid artery:  Normal course caliber and contour. Left external carotid artery: Patent with antegrade flow. Left internal carotid artery: Normal course caliber and contour of the cervical portion. Mild atherosclerotic plaque at the origin of the left ICA without significant stenosis. Vertical and petrous segment patent with normal course caliber contour. Cavernous segment patent. Clinoid segment patent. Antegrade flow of the ophthalmic artery. Ophthalmic segment patent. Terminus patent. Left MCA: M1 segment patent. Insular and opercular segments patent. Unremarkable caliber and course of the cortical segments. Typical arterial, capillary/ parenchymal, and venous phase. Left ACA: A 1 segment patent. A 2 segment perfuses the right ACA territory. Flat panel CT: Small volume subarachnoid hemorrhage within the right sulci, [place] IIIC PROCEDURE: The anesthesia team was present to provide general endotracheal tube anesthesia and for patient monitoring during the procedure. Intubation was performed in negative pressure [place] in neuro IR holding. Interventional neuro radiology nursing staff was also present. Ultrasound survey of the right inguinal region was performed with images stored and sent to PACs. 11 blade scalpel was used to make a small incision. Blunt dissection was performed with US guidance. A micropuncture needle was used access the right common femoral artery under ultrasound. With excellent arterial blood flow returned, an .018 micro wire was passed through the needle, observed to enter the abdominal aorta under fluoroscopy. The needle was removed, and a micropuncture sheath was  placed over the wire. The inner dilator and wire were removed, and an 035 wire was advanced under fluoroscopy into the abdominal aorta. The sheath was removed and a 25cm 68F straight vascular sheath was placed. The dilator was removed and the sheath was flushed. Sheath was attached to pressurized and heparinized saline bag for constant forward flow. A coaxial system was then advanced over the 035 wire. This included a 95cm 087 "Walrus" balloon guide with coaxial 125cm Berenstein diagnostic catheter. This was advanced to the proximal descending thoracic aorta. Wire was then removed. Double flush of the catheter was  performed. Catheter was then used to select the innominate artery. Angiogram was performed. Using roadmap technique, the catheter was advanced over a standard glide wire into right cervical ICA, with distal position achieved of the balloon guide. The diagnostic catheter and the wire were removed. Formal angiogram was performed. Road map function was used once the occluded vessel was identified. Copious back flush was performed and the balloon catheter was attached to heparinized and pressurized saline bag for forward flow. A second coaxial system was then advanced through the balloon catheter, which included the selected intermediate catheter, microcatheter, and microwire. In this scenario, the set up included a zoom 71 aspiration catheter, a Trevo Provue18 microcatheter, and 014 synchro soft wire. This system was advanced through the balloon guide catheter under the road-map function, with adequate back-flush at the rotating hemostatic valve at that back end of the balloon guide. Once the wire and microcatheter were into the laceral segment, we attach heparinized saline pressured flush to the guide. Microcatheter and the intermediate catheter system were advanced through the terminal ICA and MCA gently to the proximal M1 segment, and were advanced up to but not through the occluded segment. The zoom  catheter was then advanced over the microwire into the proximal M1 segment. The microcatheter microwire were then removed. The proprietary engine was attached to the hub of the aspiration catheter confirming return of flow. Catheter was then gently advanced into the embolus/thrombus at the site of occlusion. Approximately 1 minute of time was observed and then the catheter was withdrawn. Catheter was withdrawn into the balloon guide with free aspiration performed at the hub of the aspiration catheter. Angiogram was performed, which confirmed restoration of flow through the proximal inferior segment, with distal migration of the thrombus into a distal branch. We elected to attempt a second pass. 2nd pass: Coaxial system was advanced through the balloon guide, which included a zoom 71 aspiration catheter, a Trevo Provue18 microcatheter, and 014 synchro soft wire. The system was advanced through the balloon guide catheter under the road-map function, with adequate back-flush at the rotating hemostatic valve at that back end of the balloon guide. Microcatheter and the intermediate catheter system were advanced through the terminal ICA and MCA to the level of the bifurcation of superior/inferior division. The micro wire was then carefully advanced into the inferior segment and through the occluded segment. Microcatheter was then manipulated through the occluded segment and the wire was removed with saline drip at the hub. Blood was then aspirated through the hub of the microcatheter, and a gentle contrast injection was performed confirming intraluminal position within the inferior division. A rotating hemostatic valve was then attached to the back end of the microcatheter, and a pressurized and heparinized saline bag was attached to the catheter. 4 x 40 solitaire device was then selected. Back flush was achieved at the rotating hemostatic valve, and then the device was gently advanced through the microcatheter to the  distal end. The retriever was then unsheathed by withdrawing the microcatheter under fluoroscopy. Once the retriever was completely unsheathed, the microcatheter was carefully stripped from the delivery device. Control angiogram was performed from the intermediate catheter. A 3 minute time interval was observed. Constant aspiration using the proprietary engine was then performed at the intermediate catheter, as the retriever was gently and slowly withdrawn with fluoroscopic observation. Once the retriever was "corked" within the tip of the intermediate catheter, both were removed from the system. Free aspiration was confirmed at the hub of the balloon guide  catheter, with free blood return confirmed. The balloon was then deflated, and a control angiogram was performed. Improved flow, with again distal migration within the inferior division. We elected to attempt a third pass. 3rd pass: The 014 synchro soft wire was loaded through a zoom 35 aspiration catheter. Roadmap angiogram was performed. The aspiration catheter and the microwire were then advanced to the inferior segment, just proximal to the occlusion site. Once the aspiration catheter was just proximal to the occlusion site the microwire was removed. The rotating hemostatic valve was then removed from the hub, and the proprietary engine was attached directly to the hub of the zoom 35 catheter the pump was turned on confirming backflow and the catheter was advanced until cessation of flow was observed. Catheter was advanced through the site of the occlusion and then withdrawn. Upon withdrawal, aspiration was performed at the hub of the balloon guide catheter. Once the aspiration catheter was completely removed, aspiration was performed at the hub of the balloon guide confirming free return of blood. Angiogram was performed. There was improved flow through the site of the occlusion, however, again distal migration was observed within the affected division, with  again distal migration within the inferior division. We elected 1 final pass into the inferior division M3/M4 with the microcatheter. Fourth pass: A roadmap angiogram was performed. Through the balloon guide, a 160 cm Trevo Trak microcatheter was advanced with the synchro soft, into the distal aspect of the inferior division, targeting the residual, most proximal occlusive embolus of the inferior division branches. Once the microcatheter was distal to the site of the occlusion, the microwire was removed. Catheter aspiration was performed as the catheter was then withdrawn. Catheter was completely withdrawn from the system. Angiogram performed demonstrated persistent occlusion in the distal M3/M4 cortical branch of the inferior division. At this time we elected to terminate the case. Balloon guide was withdrawn. A JB 1 catheter was used to select the origin of the left CCA. Angiogram was performed. JB 1 catheter was then removed. The skin at the puncture site was then cleaned with Chlorhexidine. The 8 French sheath was removed and an 63F angioseal was deployed. Flat panel CT was performed. Patient was extubated once the CT was reviewed. Patient tolerated the procedure well and remained hemodynamically stable throughout. No complications were encountered and no significant blood loss encountered. IMPRESSION: Status post ultrasound guided access right common femoral artery for mechanical thrombectomy of right-sided MCA ELVO, with 4 passes achieving TICI 2c flow at the completion. Angiogram of the left ICA demonstrates no occlusive branches intracranial. Angio-Seal restored for hemostasis. Signed, Dulcy Fanny. Dellia Nims, RPVI [facility] Radiology PLAN: Patient extubated ICU status Target systolic blood pressure of 120-140 Right hip straight time 4 hours Frequent neurovascular checks Repeat neurologic imaging with CT and/MRI at the discretion of neurology team Electronically  Signed   By: Corrie Mckusick D.O.   On: 12/18/2020 21:30   CT HEAD CODE STROKE WO CONTRAST  Result Date: 12/17/2020 CLINICAL DATA:  Code stroke.  Left-sided weakness EXAM: CT HEAD WITHOUT CONTRAST TECHNIQUE: Contiguous axial images were obtained from the base of the skull through the vertex without intravenous contrast. COMPARISON:  08/02/2019 FINDINGS: Brain: Small chronic high right frontal cortex infarct. No convincing acute infarct. No acute hemorrhage. Remote bilateral cerebellar infarcts. No hydrocephalus or mass. Vascular: Hyperdense right M1 segment Skull: Negative Sinuses/Orbits: Negative Other: Critical Value/emergent results were called by telephone at the time of interpretation on 12/17/2020 at  5:24 am to provider [facility] , who verbally acknowledged these results. ASPECTS [facility] Stroke Program Early CT Score) - Ganglionic level infarction (caudate, lentiform nuclei, internal capsule, insula, M1-M3 cortex): 7 - Supraganglionic infarction (M4-M6 cortex): 3 when accounting for chronic cortex infarct Total score (0-10 with 10 being normal): 10 IMPRESSION: 1. Hyperdense right M1 segment, reference CTA. 2. No acute hemorrhage or visible acute infarct. 3. Remote right frontal cortex and bilateral cerebellar infarcts. Electronically Signed   By: Jorje Guild M.D.   On: 12/17/2020 05:25   IR ANGIO INTRA EXTRACRAN SEL COM CAROTID INNOMINATE UNI L MOD SED  Result Date: 12/19/2020 INDICATION: 60 year old male with a history acute stroke right M1/right MCA syndrome, presents for angiogram and possible mechanical thrombectomy EXAM: ULTRASOUND-GUIDED ACCESS RIGHT COMMON FEMORAL ARTERY CERVICAL AND CEREBRAL ANGIOGRAM MECHANICAL THROMBECTOMY RIGHT MCA FLAT PANEL CT ANGIO-SEAL FOR HEMOSTASIS COMPARISON:  CT IMAGING SAME DAY MEDICATIONS: None ANESTHESIA/SEDATION: The anesthesia team was present to provide general endotracheal tube anesthesia and for patient monitoring during the procedure. Intubation was  performed in room 2 neuro IR. Left radial arterial line was performed by the anesthesia team. Interventional neuro radiology nursing staff was also present. CONTRAST:  90 cc Omni 240 FLUOROSCOPY TIME:  Fluoroscopy Time: 19 minutes 6 seconds (1588 mGy). COMPLICATIONS: SIR LEVEL B - Normal therapy, includes overnight admission for observation. Subarachnoid hemorrhage TECHNIQUE: Informed written consent was obtained from the patient's family after a thorough discussion of the procedural risks, benefits and alternatives. Specific risks discussed include: Bleeding, infection, contrast reaction, kidney injury/failure, need for further procedure/surgery, arterial injury or dissection, embolization to new territory, intracranial hemorrhage (10-15% risk), neurologic deterioration, cardiopulmonary collapse, death. All questions were addressed. Maximal Sterile Barrier Technique was utilized including during the procedure including caps, mask, sterile gowns, sterile gloves, sterile drape, hand hygiene and skin antiseptic. A timeout was performed prior to the initiation of the procedure. The anesthesia team was present to provide general endotracheal tube anesthesia and for patient monitoring during the procedure. Interventional neuro radiology nursing staff was also present. FINDINGS: Initial Findings: Right common carotid artery:  Normal course caliber and contour. Right external carotid artery: Patent with antegrade flow. Right internal carotid artery: Normal course caliber and contour of the cervical portion. Vertical and petrous segment patent with normal course caliber contour. Cavernous segment patent. Clinoid segment patent. Antegrade flow of the ophthalmic artery. Ophthalmic segment patent. Terminus patent. Right MCA: Occlusion was present at the branch point of superior and inferior. There had been migration of the M1 thrombus distally to the branch point, when compared to the baseline CT angiogram. The embolus had  moved primarily into the inferior division, with flow maintained into the superior division on the initial angiogram. Right ACA: A 1 segment patent. Full perfusion of the right ACA territory with crossover flow into the left ACA territory via patent anterior communicating artery. Completion Findings: Right MCA: Restoration of flow through the right MCA, into the inferior and superior division branch point. The fragmented embolus entered the inferior division. The inferior division is the dominant division, with perfusion of the temporal lobe and the parietal lobe, with the superior segment perfusing essentially the frontal region only. After treatment, there is restoration of the proximal flow, with slow flow within distal branches of the inferior division, affecting both parietal and temporal lobes, with TICI 2c flow: Near complete perfusion except for slow flow in a few distal cortical vessels/presence of small distal cortical emboli Left: Left common carotid artery:  Normal course  caliber and contour. Left external carotid artery: Patent with antegrade flow. Left internal carotid artery: Normal course caliber and contour of the cervical portion. Mild atherosclerotic plaque at the origin of the left ICA without significant stenosis. Vertical and petrous segment patent with normal course caliber contour. Cavernous segment patent. Clinoid segment patent. Antegrade flow of the ophthalmic artery. Ophthalmic segment patent. Terminus patent. Left MCA: M1 segment patent. Insular and opercular segments patent. Unremarkable caliber and course of the cortical segments. Typical arterial, capillary/ parenchymal, and venous phase. Left ACA: A 1 segment patent. A 2 segment perfuses the right ACA territory. Flat panel CT: Small volume subarachnoid hemorrhage within the right sulci, [place] IIIC PROCEDURE: The anesthesia team was present to provide general endotracheal tube anesthesia and for patient monitoring during the  procedure. Intubation was performed in negative pressure [place] in neuro IR holding. Interventional neuro radiology nursing staff was also present. Ultrasound survey of the right inguinal region was performed with images stored and sent to PACs. 11 blade scalpel was used to make a small incision. Blunt dissection was performed with US guidance. A micropuncture needle was used access the right common femoral artery under ultrasound. With excellent arterial blood flow returned, an .018 micro wire was passed through the needle, observed to enter the abdominal aorta under fluoroscopy. The needle was removed, and a micropuncture sheath was placed over the wire. The inner dilator and wire were removed, and an 035 wire was advanced under fluoroscopy into the abdominal aorta. The sheath was removed and a 25cm 24F straight vascular sheath was placed. The dilator was removed and the sheath was flushed. Sheath was attached to pressurized and heparinized saline bag for constant forward flow. A coaxial system was then advanced over the 035 wire. This included a 95cm 087 "Walrus" balloon guide with coaxial 125cm Berenstein diagnostic catheter. This was advanced to the proximal descending thoracic aorta. Wire was then removed. Double flush of the catheter was performed. Catheter was then used to select the innominate artery. Angiogram was performed. Using roadmap technique, the catheter was advanced over a standard glide wire into right cervical ICA, with distal position achieved of the balloon guide. The diagnostic catheter and the wire were removed. Formal angiogram was performed. Road map function was used once the occluded vessel was identified. Copious back flush was performed and the balloon catheter was attached to heparinized and pressurized saline bag for forward flow. A second coaxial system was then advanced through the balloon catheter, which included the selected intermediate catheter, microcatheter, and microwire. In  this scenario, the set up included a zoom 71 aspiration catheter, a Trevo Provue18 microcatheter, and 014 synchro soft wire. This system was advanced through the balloon guide catheter under the road-map function, with adequate back-flush at the rotating hemostatic valve at that back end of the balloon guide. Once the wire and microcatheter were into the laceral segment, we attach heparinized saline pressured flush to the guide. Microcatheter and the intermediate catheter system were advanced through the terminal ICA and MCA gently to the proximal M1 segment, and were advanced up to but not through the occluded segment. The zoom catheter was then advanced over the microwire into the proximal M1 segment. The microcatheter microwire were then removed. The proprietary engine was attached to the hub of the aspiration catheter confirming return of flow. Catheter was then gently advanced into the embolus/thrombus at the site of occlusion. Approximately 1 minute of time was observed and then the catheter was withdrawn. Catheter was withdrawn  into the balloon guide with free aspiration performed at the hub of the aspiration catheter. Angiogram was performed, which confirmed restoration of flow through the proximal inferior segment, with distal migration of the thrombus into a distal branch. We elected to attempt a second pass. 2nd pass: Coaxial system was advanced through the balloon guide, which included a zoom 71 aspiration catheter, a Trevo Provue18 microcatheter, and 014 synchro soft wire. The system was advanced through the balloon guide catheter under the road-map function, with adequate back-flush at the rotating hemostatic valve at that back end of the balloon guide. Microcatheter and the intermediate catheter system were advanced through the terminal ICA and MCA to the level of the bifurcation of superior/inferior division. The micro wire was then carefully advanced into the inferior segment and through the  occluded segment. Microcatheter was then manipulated through the occluded segment and the wire was removed with saline drip at the hub. Blood was then aspirated through the hub of the microcatheter, and a gentle contrast injection was performed confirming intraluminal position within the inferior division. A rotating hemostatic valve was then attached to the back end of the microcatheter, and a pressurized and heparinized saline bag was attached to the catheter. 4 x 40 solitaire device was then selected. Back flush was achieved at the rotating hemostatic valve, and then the device was gently advanced through the microcatheter to the distal end. The retriever was then unsheathed by withdrawing the microcatheter under fluoroscopy. Once the retriever was completely unsheathed, the microcatheter was carefully stripped from the delivery device. Control angiogram was performed from the intermediate catheter. A 3 minute time interval was observed. Constant aspiration using the proprietary engine was then performed at the intermediate catheter, as the retriever was gently and slowly withdrawn with fluoroscopic observation. Once the retriever was "corked" within the tip of the intermediate catheter, both were removed from the system. Free aspiration was confirmed at the hub of the balloon guide catheter, with free blood return confirmed. The balloon was then deflated, and a control angiogram was performed. Improved flow, with again distal migration within the inferior division. We elected to attempt a third pass. 3rd pass: The 014 synchro soft wire was loaded through a zoom 35 aspiration catheter. Roadmap angiogram was performed. The aspiration catheter and the microwire were then advanced to the inferior segment, just proximal to the occlusion site. Once the aspiration catheter was just proximal to the occlusion site the microwire was removed. The rotating hemostatic valve was then removed from the hub, and the proprietary  engine was attached directly to the hub of the zoom 35 catheter the pump was turned on confirming backflow and the catheter was advanced until cessation of flow was observed. Catheter was advanced through the site of the occlusion and then withdrawn. Upon withdrawal, aspiration was performed at the hub of the balloon guide catheter. Once the aspiration catheter was completely removed, aspiration was performed at the hub of the balloon guide confirming free return of blood. Angiogram was performed. There was improved flow through the site of the occlusion, however, again distal migration was observed within the affected division, with again distal migration within the inferior division. We elected 1 final pass into the inferior division M3/M4 with the microcatheter. Fourth pass: A roadmap angiogram was performed. Through the balloon guide, a 160 cm Trevo Trak microcatheter was advanced with the synchro soft, into the distal aspect of the inferior division, targeting the residual, most proximal occlusive embolus of the inferior division branches. Once  the microcatheter was distal to the site of the occlusion, the microwire was removed. Catheter aspiration was performed as the catheter was then withdrawn. Catheter was completely withdrawn from the system. Angiogram performed demonstrated persistent occlusion in the distal M3/M4 cortical branch of the inferior division. At this time we elected to terminate the case. Balloon guide was withdrawn. A JB 1 catheter was used to select the origin of the left CCA. Angiogram was performed. JB 1 catheter was then removed. The skin at the puncture site was then cleaned with Chlorhexidine. The 8 French sheath was removed and an 51F angioseal was deployed. Flat panel CT was performed. Patient was extubated once the CT was reviewed. Patient tolerated the procedure well and remained hemodynamically stable throughout. No complications were encountered and no significant blood loss  encountered. IMPRESSION: Status post ultrasound guided access right common femoral artery for mechanical thrombectomy of right-sided MCA ELVO, with 4 passes achieving TICI 2c flow at the completion. Angiogram of the left ICA demonstrates no occlusive branches intracranial. Angio-Seal restored for hemostasis. Signed, Dulcy Fanny. Dellia Nims, RPVI [facility] Radiology PLAN: Patient extubated ICU status Target systolic blood pressure of 120-140 Right hip straight time 4 hours Frequent neurovascular checks Repeat neurologic imaging with CT and/MRI at the discretion of neurology team Electronically Signed   By: Corrie Mckusick D.O.   On: 12/18/2020 21:30      HISTORY OF PRESENT ILLNESS  This is a 60 yo L-handed gentleman with hx DM2, HTN, STEMI 2016, HL, recent admission to [facility] for acute ischemic R MCA stroke s/p thrombectomy for R M1 occlusion. He initially presented to [facility] 12/17/20 with R MCA syndrome NIHSS = 17. He underwent thrombectomy for R M1 occlusion. He improved significantly and was discharged with only residual mild L facial droop and some mild aphasia. He was started on lovenox bridge to warfarin for anticoagulation for LV thrombus. He is currently taking warfarin 5mg  qhs. He presented today from his cardiologist's office where he was found to have an INR>10 and reported a headache. Head CT upon arrival to ED showed new small acute hemorrhages in the right frontal and temporal lobes in the region of his recent evolving R MCA territory stroke. Inferior R frontal lobe shows edema but there is no midline shift. Chronic R frontal and bilateral cerebellar infarcts. He was neurologically at his baseline with stable exam. He was admitted to the neurologic ICU for further work up,  monitoring and care.   HOSPITAL COURSE   Hemorrhagic transformation due to supratherapeutic INR CT head new small acute hemorrhagic conversion within the right frontal and  temporal lobes in the region of recent right MCA stroke. Repeat CT stable hemorrhagic transformation INR > 10 -> vitamin K and Kcentra-> 6.6-> 1.2 Currently off antithrombotics Neurologic exam remained stable. Patient adamantly requested discharge home.  Hold off antithrombotics for now, may consider repeat CT in 5 to 7 days.  If hemorrhagic conversion of disorder or near obstruction, will consider DOAC    LV thrombus TTE showed nonmobile LV mural thrombus (approximately 1.2 x 0.5 cm) in region of akinesis involving the apical septal wall.  Cardiac MRI confirmed LV apical thrombus Loop recorder so far no A. fib 08/2019 TEE showed EF 50 to 55%, no regional wall motion abnormalities, no PFO Hx of CAD s/p stents Discharged 9/20 with Coumadin bridged with Lovenox INR supratherapeutic > 10 -> vitamin K and Kcentra-> 6.6-> 1.2 Cardiology follow-up visit scheduled 01/02/2021 with Dr.  Caldwell at [place] off antithrombotics for now, repeat CT Head on Thursday Oct 6th and plan to see Dr. Manuella Ghazi afterward same day.  If follow up  imaging conducive consider DOAC.    History of stroke 02/2015, left arm weakness, difficulty walking, MRI showed right motor strip small infarct.  EF 60 to 65%, MRA head and neck right VA occlusion.  A1c 7.2, LDL 57, discharged with DAPT 08/2019 admitted for right superior cerebellum infarct with hemorrhagic transformation.  CT head and neck right VA chronic occlusion.  EF 65 to 70%.  TEE showed EF 50 to 55%, no regional wall motion abnormalities, no PFO.  Loop recorder placed.  LDL 75, A1c 8.7, discharged with DAPT and Lipitor. Followed with Dr. Leonie Man and Janett Billow NP at [facility], loop recorder negative for A. fib. 12/2020 admitted for left-sided weakness and aphasia.  CT no acute abnormality, but old right frontal and bilateral cerebellar infarcts.  Hyperdense right MCA sign.  CTA head and neck showed right M1 occlusion.  Status post IR with TICI2c.  MRI post op right M1  patent.  2D echo showed EF 60 to 65%, but LV thrombus.  Cardiac MRI confirmed LV thrombus.  LDL 49, A1c 6.7.  Discharged with Coumadin in bridge with Lovenox.   Diabetes HgbA1c 6.7, goal < 7.0 controlled CBG monitoring SSI On heart healthy and carb diet DM education and close PCP follow up   Hypertension Home meds including metoprolol Stable Hold off metoprolol for now due to bradycardia Long term BP goal normotensive   Hyperlipidemia Home meds: Lipitor 80 and Zetia 10 LDL 49, goal < 70 Continue home Lipitor 80 and Zetia 10 Continue statin at discharge   Other Stroke Risk Factors Obesity, BMI 33.25 Coronary artery disease/MI s/p stenting   Other Active Problems CKD IIIA, creatinine 1.25->1.43, encourage p.o. intake.R     DISCHARGE EXAM Blood pressure 116/75, pulse 68, temperature 98.1 F (36.7 C), temperature source Oral, resp. rate 12, height 6\' 1"  (1.854 m), weight 106 kg, SpO2 92 %.  General - Well nourished, well developed, in no apparent distress.   Ophthalmologic - fundi not visualized due to noncooperation.   Cardiovascular - Regular rhythm and rate.   Mental Status -  Level of arousal and orientation to time, place, and person were intact. Language including expression, naming, repetition, comprehension was assessed and found intact except occasional word finding difficulty. Fund of Knowledge was assessed and was intact.   Cranial Nerves II - XII - II - Visual field intact OU. III, IV, VI - Extraocular movements intact. V - Facial sensation intact bilaterally. VII - Facial movement intact bilaterally. VIII - Hearing & vestibular intact bilaterally. X - Palate elevates symmetrically. XI - Chin turning & shoulder shrug intact bilaterally. XII - Tongue protrusion intact.   Motor Strength - The patient's strength was normal in all extremities and pronator drift was absent.  Bulk was normal and fasciculations were absent.   Motor Tone - Muscle tone was  assessed at the neck and appendages and was normal.   Reflexes - The patient's reflexes were symmetrical in all extremities and he had no pathological reflexes.   Sensory - Light touch, temperature/pinprick were assessed and were symmetrical.     Coordination - The patient had normal movements in the hands and feet with no ataxia or dysmetria.  Tremor was absent.   Gait and Station - deferred.  Discharge Diet       There are no active orders of  the following types: Diet, Nourishments.   liquids  DISCHARGE PLAN Disposition:  Home DISCONTINUE WARFARIN, NO ANTICOAGULANTS OR ANTIPLATELETS FOR THE PRESENT PENDING FURTHER IMAGING Ongoing stroke risk factor control by Primary Care Physician at time of discharge Follow-up PCP McLean-Scocuzza, Nino Glow, MD in 2 weeks.  Follow up with Dr. Manuella Ghazi on Oct 6th after repeat Head CT (order sent). Dr. Erlinda Hong to arrange.   35 minutes were spent preparing discharge.  This plan of care was directed by Dr. Erlinda Hong.    ATTENDING NOTE: I reviewed above note and agree with the assessment and plan. Pt was seen and examined.   Pt lying in bed, wife at the bedside.  No acute event overnight, neuro stable.  He is eager to go home.  We will set up neuro follow-up with Dr. Manuella Ghazi at [facility] within next week and repeat CT next Thursday.  If hemorrhagic transformation of disorder or near obstruction, will start Eliquis for stroke prevention due to LV thrombus.  He has appoint with his cardiologist at [facility] tomorrow.  Continue other home medication except Coumadin.  For detailed assessment and plan, please refer to above as I have made changes wherever appropriate.   Rosalin Hawking, MD PhD Stroke Neurology 01/01/2021 6:44 PM

## 2021-01-02 DIAGNOSIS — I208 Other forms of angina pectoris: Secondary | ICD-10-CM | POA: Diagnosis not present

## 2021-01-02 DIAGNOSIS — I25119 Atherosclerotic heart disease of native coronary artery with unspecified angina pectoris: Secondary | ICD-10-CM | POA: Diagnosis not present

## 2021-01-02 DIAGNOSIS — I639 Cerebral infarction, unspecified: Secondary | ICD-10-CM | POA: Diagnosis not present

## 2021-01-02 DIAGNOSIS — I2510 Atherosclerotic heart disease of native coronary artery without angina pectoris: Secondary | ICD-10-CM | POA: Diagnosis not present

## 2021-01-02 LAB — URINE CULTURE: Culture: 60000 — AB

## 2021-01-03 NOTE — Progress Notes (Signed)
ED Antimicrobial Stewardship Positive Culture Follow Up   Karl Morgan is an 60 y.o. male who presented to [facility] on 12/30/2020 with a chief complaint of coagulation disorder. Pt had slight dysuria while at [facility] after foley was discontinued but did not have any further symptoms when transferred to [facility]. Called pt to see if still have symptoms as ED provider stated to treat if pt still continuing to have symptoms. Pt denies any further symptoms and states he is doing much better.   Chief Complaint  Patient presents with   Coagulation Disorder    Recent Results (from the past 720 hour(s))  Resp Panel by RT-PCR (Flu A&B, Covid) Nasopharyngeal Swab     Status: None   Collection Time: 12/17/20  5:06 AM   Specimen: Nasopharyngeal Swab; Nasopharyngeal(NP) swabs in vial transport medium  Result Value Ref Range Status   SARS Coronavirus 2 by RT PCR NEGATIVE NEGATIVE Final    Comment: (NOTE) SARS-CoV-2 target nucleic acids are NOT DETECTED.  The SARS-CoV-2 RNA is generally detectable in upper respiratory specimens during the acute phase of infection. The lowest concentration of SARS-CoV-2 viral copies this assay can detect is 138 copies/mL. A negative result does not preclude SARS-Cov-2 infection and should not be used as the sole basis for treatment or other patient management decisions. A negative result may occur with  improper specimen collection/handling, submission of specimen other than nasopharyngeal swab, presence of viral mutation(s) within the areas targeted by this assay, and inadequate number of viral copies(<138 copies/mL). A negative result must be combined with clinical observations, patient history, and epidemiological information. The expected result is Negative.  Fact Sheet for Patients:  EntrepreneurPulse.com.au  Fact Sheet for Healthcare Providers:  IncredibleEmployment.be  This test is no t yet approved or cleared by the  [place] FDA and  has been authorized for detection and/or diagnosis of SARS-CoV-2 by FDA under an Emergency Use Authorization (EUA). This EUA will remain  in effect (meaning this test can be used) for the duration of the COVID-19 declaration under Section 564(b)(1) of the Act, 21 U.S.C.section 360bbb-3(b)(1), unless the authorization is terminated  or revoked sooner.       Influenza A by PCR NEGATIVE NEGATIVE Final   Influenza B by PCR NEGATIVE NEGATIVE Final    Comment: (NOTE) The Xpert Xpress SARS-CoV-2/FLU/RSV plus assay is intended as an aid in the diagnosis of influenza from Nasopharyngeal swab specimens and should not be used as a sole basis for treatment. Nasal washings and aspirates are unacceptable for Xpert Xpress SARS-CoV-2/FLU/RSV testing.  Fact Sheet for Patients: EntrepreneurPulse.com.au  Fact Sheet for Healthcare Providers: IncredibleEmployment.be  This test is not yet approved or cleared by the [place] FDA and has been authorized for detection and/or diagnosis of SARS-CoV-2 by FDA under an Emergency Use Authorization (EUA). This EUA will remain in effect (meaning this test can be used) for the duration of the COVID-19 declaration under Section 564(b)(1) of the Act, 21 U.S.C. section 360bbb-3(b)(1), unless the authorization is terminated or revoked.  Performed at [facility], [address], [place], [place] [address]   Urine Culture     Status: Abnormal   Collection Time: 12/30/20  2:20 PM   Specimen: Urine, Clean Catch  Result Value Ref Range Status   Specimen Description   Final    URINE, CLEAN CATCH Performed at [facility], [address] [place], [place] [address]    Special Requests   Final    NONE  Performed at [facility], [place], [place] [address]    Culture 60,000 COLONIES/mL STAPHYLOCOCCUS EPIDERMIDIS (A)  Final   Report Status 01/02/2021 FINAL   Final   Organism ID, Bacteria STAPHYLOCOCCUS EPIDERMIDIS (A)  Final      Susceptibility   Staphylococcus epidermidis - MIC*    CIPROFLOXACIN <=0.5 SENSITIVE Sensitive     GENTAMICIN <=0.5 SENSITIVE Sensitive     NITROFURANTOIN <=16 SENSITIVE Sensitive     OXACILLIN >=4 RESISTANT Resistant     TETRACYCLINE 2 SENSITIVE Sensitive     VANCOMYCIN 2 SENSITIVE Sensitive     TRIMETH/SULFA <=10 SENSITIVE Sensitive     CLINDAMYCIN <=0.25 SENSITIVE Sensitive     RIFAMPIN <=0.5 SENSITIVE Sensitive     Inducible Clindamycin NEGATIVE Sensitive     * 60,000 COLONIES/mL STAPHYLOCOCCUS EPIDERMIDIS  Resp Panel by RT-PCR (Flu A&B, Covid) Nasopharyngeal Swab     Status: None   Collection Time: 12/31/20  1:30 AM   Specimen: Nasopharyngeal Swab; Nasopharyngeal(NP) swabs in vial transport medium  Result Value Ref Range Status   SARS Coronavirus 2 by RT PCR NEGATIVE NEGATIVE Final    Comment: (NOTE) SARS-CoV-2 target nucleic acids are NOT DETECTED.  The SARS-CoV-2 RNA is generally detectable in upper respiratory specimens during the acute phase of infection. The lowest concentration of SARS-CoV-2 viral copies this assay can detect is 138 copies/mL. A negative result does not preclude SARS-Cov-2 infection and should not be used as the sole basis for treatment or other patient management decisions. A negative result may occur with  improper specimen collection/handling, submission of specimen other than nasopharyngeal swab, presence of viral mutation(s) within the areas targeted by this assay, and inadequate number of viral copies(<138 copies/mL). A negative result must be combined with clinical observations, patient history, and epidemiological information. The expected result is Negative.  Fact Sheet for Patients:  EntrepreneurPulse.com.au  Fact Sheet for Healthcare Providers:  IncredibleEmployment.be  This test is no t yet approved or cleared by the [place]  FDA and  has been authorized for detection and/or diagnosis of SARS-CoV-2 by FDA under an Emergency Use Authorization (EUA). This EUA will remain  in effect (meaning this test can be used) for the duration of the COVID-19 declaration under Section 564(b)(1) of the Act, 21 U.S.C.section 360bbb-3(b)(1), unless the authorization is terminated  or revoked sooner.       Influenza A by PCR NEGATIVE NEGATIVE Final   Influenza B by PCR NEGATIVE NEGATIVE Final    Comment: (NOTE) The Xpert Xpress SARS-CoV-2/FLU/RSV plus assay is intended as an aid in the diagnosis of influenza from Nasopharyngeal swab specimens and should not be used as a sole basis for treatment. Nasal washings and aspirates are unacceptable for Xpert Xpress SARS-CoV-2/FLU/RSV testing.  Fact Sheet for Patients: EntrepreneurPulse.com.au  Fact Sheet for Healthcare Providers: IncredibleEmployment.be  This test is not yet approved or cleared by the [place] FDA and has been authorized for detection and/or diagnosis of SARS-CoV-2 by FDA under an Emergency Use Authorization (EUA). This EUA will remain in effect (meaning this test can be used) for the duration of the COVID-19 declaration under Section 564(b)(1) of the Act, 21 U.S.C. section 360bbb-3(b)(1), unless the authorization is terminated or revoked.  Performed at [facility], [address], [place], [place] [address]     [x]  Patient discharged originally without antimicrobial agent and treatment is not indicated per discussion with ED provider (see above)  ED Provider: Dr. Timoteo Expose  Sherilyn Banker, PharmD  Clinical Pharmacist 01/03/2021 3:27 PM

## 2021-01-04 ENCOUNTER — Inpatient Hospital Stay: Payer: 59

## 2021-01-04 ENCOUNTER — Other Ambulatory Visit: Payer: Self-pay | Admitting: *Deleted

## 2021-01-04 ENCOUNTER — Inpatient Hospital Stay: Payer: 59 | Admitting: Oncology

## 2021-01-04 ENCOUNTER — Encounter: Payer: Self-pay | Admitting: Oncology

## 2021-01-04 ENCOUNTER — Other Ambulatory Visit: Payer: Self-pay

## 2021-01-04 VITALS — BP 129/84 | HR 66 | Temp 98.1°F | Resp 20 | Wt 237.9 lb

## 2021-01-04 DIAGNOSIS — I513 Intracardiac thrombosis, not elsewhere classified: Secondary | ICD-10-CM | POA: Insufficient documentation

## 2021-01-04 DIAGNOSIS — I639 Cerebral infarction, unspecified: Secondary | ICD-10-CM

## 2021-01-04 DIAGNOSIS — R4701 Aphasia: Secondary | ICD-10-CM | POA: Insufficient documentation

## 2021-01-04 DIAGNOSIS — Z8673 Personal history of transient ischemic attack (TIA), and cerebral infarction without residual deficits: Secondary | ICD-10-CM | POA: Insufficient documentation

## 2021-01-04 DIAGNOSIS — Z7984 Long term (current) use of oral hypoglycemic drugs: Secondary | ICD-10-CM | POA: Insufficient documentation

## 2021-01-04 LAB — C-REACTIVE PROTEIN: CRP: 0.8 mg/dL (ref <1.0)

## 2021-01-04 NOTE — Progress Notes (Signed)
pt believes he has a UTI; burns when urinating; recieved a call from stating WBC  levels are high so pt is somewhat nervous. Had a catheter when he was in the hospital: sx started while in the hospital. clot in his heart; believes that is what caused his third stroke

## 2021-01-04 NOTE — Patient Outreach (Signed)
[facility]) Care Management  01/04/2021  Karl Morgan [birth year] [id number]   Transition of care call  Referral received:12/31/20 Initial outreach:01/04/21 Insurance: [employer]    Subjective: Outreach call to patient , explained reason for the call, he reports being in a meeting with contractor for his home and unable to talk and request return call to me, has my  return call number .   Objective:  [name]  was hospitalized at [facility] 10/10- 01/01/21  for  new small acute hemorrhage in the right frontal and temporal lobes in the region of recent R MCA territory stroke.  Hemorrhagic transformation due to supratherapeutic INR, PMHx includes :Hypertension,  DM2, Hyperlipidemia, Apical thrombus, Acute Right MCA stroke s/p thrombectomy.  He was discharged to home on 01/01/21 without the need for home health services or DME.  Will  route unsuccessful outreach letter with [place] Management pamphlet and 24 Hour Nurse Line Magnet to [place] Management clinical pool to be mailed to patient's home address. Will plan return call in the next 4 business days if no return call.     Joylene Draft, RN, BSN  [place] Management Coordinator  [phone]- Mobile [phone]- Toll Free Main Office

## 2021-01-05 ENCOUNTER — Other Ambulatory Visit: Payer: Self-pay

## 2021-01-05 ENCOUNTER — Ambulatory Visit
Admission: RE | Admit: 2021-01-05 | Discharge: 2021-01-05 | Disposition: A | Discharge status: Home / Self Care | Payer: 59 | Source: Ambulatory Visit | Attending: Adult Health | Admitting: Adult Health

## 2021-01-05 DIAGNOSIS — Z8673 Personal history of transient ischemic attack (TIA), and cerebral infarction without residual deficits: Secondary | ICD-10-CM | POA: Diagnosis not present

## 2021-01-05 DIAGNOSIS — I6523 Occlusion and stenosis of bilateral carotid arteries: Secondary | ICD-10-CM | POA: Diagnosis not present

## 2021-01-05 DIAGNOSIS — I639 Cerebral infarction, unspecified: Secondary | ICD-10-CM | POA: Diagnosis not present

## 2021-01-05 LAB — LUPUS ANTICOAGULANT
DRVVT: 29.6 s (ref 0.0–47.0)
PTT Lupus Anticoagulant: 31.2 s (ref 0.0–51.9)
Thrombin Time: 18.9 s (ref 0.0–23.0)
dPT Confirm Ratio: 0.93 Ratio (ref 0.00–1.34)
dPT: 33.2 s (ref 0.0–47.6)

## 2021-01-05 LAB — ANA COMPREHENSIVE PANEL
Anti JO-1: <0.2 AI (ref 0.0–0.9)
Centromere Ab Screen: <0.2 AI (ref 0.0–0.9)
Chromatin Ab SerPl-aCnc: <0.2 AI (ref 0.0–0.9)
ENA SM Ab Ser-aCnc: <0.2 AI (ref 0.0–0.9)
Ribonucleic Protein: 0.2 AI (ref 0.0–0.9)
SSA (Ro) (ENA) Antibody, IgG: <0.2 AI (ref 0.0–0.9)
SSB (La) (ENA) Antibody, IgG: <0.2 AI (ref 0.0–0.9)
Scleroderma (Scl-70) (ENA) Antibody, IgG: 0.2 AI (ref 0.0–0.9)
ds DNA Ab: 1 IU/mL (ref 0–9)

## 2021-01-05 LAB — PROTEIN S PANEL
Protein S Activity: 65 % (ref 63–140)
Protein S Ag, Free: 84 % (ref 61–136)
Protein S Ag, Total: 64 % (ref 60–150)

## 2021-01-05 LAB — CARDIOLIPIN ANTIBODIES, IGG, IGM, IGA
Anticardiolipin IgA: <9 APL U/mL (ref 0–11)
Anticardiolipin IgG: <9 GPL U/mL (ref 0–14)
Anticardiolipin IgM: <9 MPL U/mL (ref 0–12)

## 2021-01-05 LAB — HEX PHASE PHOSPHOLIPID REFLEX

## 2021-01-05 LAB — ANTITHROMBIN III: AntiThromb III Func: 90 % (ref 75–120)

## 2021-01-05 LAB — PROTEIN C, TOTAL: Protein C, Total: 80 % (ref 60–150)

## 2021-01-05 LAB — HEXAGONAL PHASE PHOSPHOLIPID: Hex Phosph Neut Test: 3 s (ref 0–11)

## 2021-01-05 MED ORDER — ELIQUIS 5 MG PO TABS
ORAL_TABLET | ORAL | 7 refills | Status: DC
  Filled 2021-01-05: qty 60, 30d supply, fill #0
  Filled 2021-01-30: qty 60, 30d supply, fill #1
  Filled 2021-02-27: qty 60, 30d supply, fill #2
  Filled 2021-04-04: qty 60, 30d supply, fill #3
  Filled 2021-05-08: qty 60, 30d supply, fill #4
  Filled 2021-06-05: qty 60, 30d supply, fill #5
  Filled 2021-07-11: qty 60, 30d supply, fill #6
  Filled 2021-08-08: qty 60, 30d supply, fill #7
  Filled 2021-09-05: qty 60, 30d supply, fill #8
  Filled 2021-10-11: qty 60, 30d supply, fill #9
  Filled 2021-11-13: qty 60, 30d supply, fill #10
  Filled 2021-12-12: qty 60, 30d supply, fill #11

## 2021-01-06 ENCOUNTER — Encounter: Payer: Self-pay | Admitting: *Deleted

## 2021-01-06 ENCOUNTER — Other Ambulatory Visit: Payer: Self-pay | Admitting: *Deleted

## 2021-01-06 NOTE — Patient Outreach (Signed)
[facility]) Care Management  01/06/2021  Karl Morgan [birth year] [id number]  Transition of care call  Referral received:01/02/21 Initial outreach:01/04/21 Insurance: [place] [employer]    Subjective: Initial successful telephone call to patient's preferred number in order to complete transition of care assessment; 2 HIPAA identifiers verified. Explained purpose of call and completed transition of care assessment.  Karl Morgan states that he is recovering well. He discussed events of recent hospital readmission due to elevated INR, hemorrhage in the brain. He discussed still having difficulty with speech, word finding that has not changed since prior stroke and plans to continue outpatient speech therapy. He denies headache dizziness, weakness, just feeling tired,reports tolerating daily walks. He discussed follow up CT after discharge, per [place] neurology looks better and he has been prescribed Eliquis starting on today. Reviewed signs/symptoms of bleeding to seek medical attention for. He reports tolerating diet, denies bowel or bladder problems.  He reports taking it easy returning to work for half days reinforced balancing activity. Spouse is available to assist in recovery as needed.    Reviewed accessing the following [place] Benefits : He discussed  ongoing health issues of diabetes reports being under control after making lifestyle changes, has participated on [place] education programs in the past.and says he does not need a referral to one of the [place] chronic disease management programs.  He does not have the hospital indemnity He uses a [facility] outpatient pharmacy at [place].  Objective:  [name]  was hospitalized at [facility] 10/10- 01/01/21  for  new small acute hemorrhage in the right frontal and temporal lobes in the region of recent R MCA territory stroke.  Hemorrhagic transformation due to supratherapeutic  INR, PMHx includes :Hypertension,  DM2, Hyperlipidemia, Apical thrombus, Acute Right MCA stroke s/p thrombectomy.  He was discharged to home on 01/01/21 without the need for home health services, will continue outpatient speech therapy, no DME needs .   Assessment:  Patient voices good understanding of all discharge instructions.  See transition of care flowsheet for assessment details.   Plan:  Reviewed hospital discharge diagnosis of Stroke   and discharge treatment plan using hospital discharge instructions, assessing medication adherence, reviewing problems requiring provider notification, and discussing the importance of follow up with surgeon, primary care provider and/or specialists as directed. Reinforced signs symptoms of stroke, bleeding teachback to seek medical attention.   Reviewed [place] healthy lifestyle program information to receive discounted premium for  2024   Step 1: Get  your annual physical  Step 2: Complete your health assessment    Patient agreeable to call in the next week for follow up assessment  Thanked patient for their services to [facility].  Joylene Draft, RN, BSN  [place] Management Coordinator  [phone]- Mobile [phone]- Toll Free Main Office

## 2021-01-07 DIAGNOSIS — N3001 Acute cystitis with hematuria: Secondary | ICD-10-CM | POA: Diagnosis not present

## 2021-01-07 DIAGNOSIS — R300 Dysuria: Secondary | ICD-10-CM | POA: Diagnosis not present

## 2021-01-07 LAB — BETA-2-GLYCOPROTEIN I ABS, IGG/M/A
Beta-2 Glyco I IgG: <9 GPI IgG units (ref 0–20)
Beta-2-Glycoprotein I IgA: <9 GPI IgA units (ref 0–25)
Beta-2-Glycoprotein I IgM: <9 GPI IgM units (ref 0–32)

## 2021-01-07 NOTE — Progress Notes (Signed)
Hematology/Oncology Consult note [facility] Telephone:([phone] [phone]  Patient Care Team: McLean-Scocuzza, Nino Glow, MD as PCP - General (Internal Medicine) Alfonzo Feller, RN as [place] Management   Name of the patient: Karl Morgan  [id number]  28-Jan-1961    Reason for referral-history of recurrent stroke and recent LV thrombus   Referring physician-Dr. Caryl Bis  Date of visit: 01/07/21   History of presenting illness- Patient is a 60 year old male with a past medical history significant for hypertension diabetes hyperlipidemia and a prior history of stroke who was found to have a right MCA infarct on 12/20/2020 for which she underwent revascularization procedure.  Echocardiogram done at that time showed a nonmobile LV mural thrombus 1.2 x 0.5 cm in size in the region of akinesis involving the apical septal wall.  He was initially started on Lovenox and then bridged to Coumadin and discharged.  10 days later patient was admitted for supratherapeutic INR and had a repeat CT head which showed small acute hemorrhagic conversion within the right frontal and temporal lobes.  Coumadin was held with plans to restart anticoagulation soon in the near future after he has a repeat CT and cleared by neurology to restart anticoagulation.  Patient states that this is his second stroke and when he had his first stroke he did not have any evidence ofLV thrombus.  ECOG PS- 1  Pain scale- 0   Review of systems- Review of Systems  Constitutional:  Negative for chills, fever, malaise/fatigue and weight loss.  HENT:  Negative for congestion, ear discharge and nosebleeds.   Eyes:  Negative for blurred vision.  Respiratory:  Negative for cough, hemoptysis, sputum production, shortness of breath and wheezing.   Cardiovascular:  Negative for chest pain, palpitations, orthopnea and claudication.  Gastrointestinal:  Negative for abdominal  pain, blood in stool, constipation, diarrhea, heartburn, melena, nausea and vomiting.  Genitourinary:  Negative for dysuria, flank pain, frequency, hematuria and urgency.  Musculoskeletal:  Negative for back pain, joint pain and myalgias.  Skin:  Negative for rash.  Neurological:  Negative for dizziness, tingling, focal weakness, seizures, weakness and headaches.  Endo/Heme/Allergies:  Does not bruise/bleed easily.  Psychiatric/Behavioral:  Negative for depression and suicidal ideas. The patient does not have insomnia.    Allergies  Allergen Reactions   Other Other (See Comments)    Rybelsus ab pain at 3 mg, 7 mg n/v   Pravachol [Pravastatin Sodium] Other (See Comments)    Muscle aches    Patient Active Problem List   Diagnosis Date Noted   ICH (intracerebral hemorrhage) ([place]) 12/31/2020   Thrombocytopenia ([place]) 12/23/2020   Left ventricular apical thrombus    Hypertension associated with diabetes ([place]) 11/08/2020   Elevated TSH 05/10/2020   Gallbladder polyp 11/12/2019   Fatty liver 11/12/2019   Bilateral carotid artery stenosis 08/06/2019   Mitral valve insufficiency 08/06/2019   Cerebrovascular accident (CVA) due to embolism of cerebral artery ([place]) 08/06/2019   Intracranial atherosclerosis 08/06/2019   CKD (chronic kidney disease), stage IIIa 08/03/2019   Arthritis 10/21/2018   Obesity (BMI 30-39.9) 08/22/2017   Herpes simplex type 1 infection 02/21/2015   Recurrent oral herpes simplex infection 02/21/2015   History of stroke 02/16/2015   Arteriosclerosis of coronary artery 10/07/2014   Hypotestosteronism 10/07/2014   B12 deficiency 12/18/2011   Hypothyroidism 05/04/2011   Annual physical exam 04/05/2011   Diabetes mellitus type 2, controlled, with complications ([place]) 37/16/9678   Essential hypertension 04/04/2011   Hyperlipidemia 04/04/2011  Hypogonadism male 04/04/2011   Alopecia 04/04/2011   Erectile dysfunction 04/04/2011     Past Medical History:  Diagnosis  Date   Alopecia 04/04/2011   Diabetes mellitus    Erectile dysfunction 04/04/2011   Heart attack ([place]) 10/07/2014   Overview:  Status post STEMI, anterior myocardial infarction status post DES stent x 2 to the LAD, mid and distal.    Heart murmur    Hyperlipidemia 04/04/2011   Hypogonadism male 04/04/2011   Myocardial infarction [facility])    Stroke [facility])    Substance abuse ([place])    Type II or unspecified type diabetes mellitus without mention of complication, uncontrolled 04/04/2011   Unspecified essential hypertension 04/04/2011     Past Surgical History:  Procedure Laterality Date   BUBBLE STUDY  08/03/2019   Procedure: BUBBLE STUDY;  Surgeon: Fay Records, MD;  Location: [facility] ENDOSCOPY;  Service: Cardiovascular;;   CARDIAC CATHETERIZATION     IR ANGIO INTRA EXTRACRAN SEL COM CAROTID INNOMINATE UNI L MOD SED  12/17/2020   IR CT HEAD LTD  12/17/2020   IR PERCUTANEOUS ART THROMBECTOMY/INFUSION INTRACRANIAL INC DIAG ANGIO  12/17/2020   IR US GUIDE VASC ACCESS RIGHT  12/17/2020   LOOP RECORDER INSERTION N/A 08/03/2019   Procedure: LOOP RECORDER INSERTION;  Surgeon: Evans Lance, MD;  Location: [facility];  Service: Cardiovascular;  Laterality: N/A;   RADIOLOGY WITH ANESTHESIA N/A 12/17/2020   Procedure: IR WITH ANESTHESIA;  Surgeon: Luanne Bras, MD;  Location: [place];  Service: Radiology;  Laterality: N/A;   TEE WITHOUT CARDIOVERSION N/A 08/03/2019   Procedure: TRANSESOPHAGEAL ECHOCARDIOGRAM (TEE);  Surgeon: Fay Records, MD;  Location: [facility] ENDOSCOPY;  Service: Cardiovascular;  Laterality: N/A;    Social History   Socioeconomic History   Marital status: Married    Spouse name: Not on file   Number of children: Not on file   Years of education: Not on file   Highest education level: Not on file  Occupational History   Occupation: OPERATIONS MANAGER of maintenance    Employer: [employer]  Tobacco Use   Smoking status: Never   Smokeless tobacco: Never  Vaping Use   Vaping Use: Never  used  Substance and Sexual Activity   Alcohol use: No    Comment: former etoh abuse in 1980s    Drug use: No   Sexual activity: Yes  Other Topics Concern   Not on file  Social History Narrative   Caffienated drinks-no   Seat belt use often-yes   Regular Exercise-no   Smoke alarm in the home-yes   Firearms/guns in the home-yes   History of physical abuse-no      Married 2 kids daughter and son    Oldest of siblings has 2 brothers and 1 sister    Never smoker    Works Physicist, 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       DPR wife Vickie             Social Determinants of Radio broadcast assistant Strain: Not on file  Food Insecurity: Not on file  Transportation Needs: Not on file  Physical Activity: Not on file  Stress: Not on file  Social Connections: Not on file  Intimate Partner Violence: Not on file     Family History  Problem Relation Age of Onset   Hypertension Father    Dementia Father  died age 19 in 2019   Memory loss Father    Emphysema Mother    COPD Mother    Thyroid disease Mother    Cancer Neg Hx    Diabetes Neg Hx    Heart disease Neg Hx    Stroke Neg Hx      Current Outpatient Medications:    atorvastatin (LIPITOR) 80 MG tablet, Take 1 tablet (80 mg total) by mouth daily., Disp: 90 tablet, Rfl: 0   dapagliflozin propanediol (FARXIGA) 10 MG TABS tablet, Take 1 tablet (10 mg total) by mouth daily., Disp: 30 tablet, Rfl: 0   ezetimibe (ZETIA) 10 MG tablet, Take 1 tablet (10 mg total) by mouth daily., Disp: 90 tablet, Rfl: 0   glimepiride (AMARYL) 2 MG tablet, TAKE 1 TABLET (2 MG TOTAL) BY MOUTH 2 (TWO) TIMES DAILY WITH A MEAL. (Patient taking differently: Take 2 mg by mouth daily with breakfast.), Disp: 180 tablet, Rfl: 3   levothyroxine (SYNTHROID) 75 MCG tablet, Take 1 tablet (75 mcg total) by mouth daily., Disp: 90 tablet, Rfl: 3   lisinopril (ZESTRIL) 40 MG tablet, TAKE 1 TABLET  (40 MG TOTAL) BY MOUTH DAILY., Disp: 90 tablet, Rfl: 3   metFORMIN (GLUCOPHAGE) 850 MG tablet, TAKE 1 TABLET BY MOUTH TWICE DAILY, Disp: 180 tablet, Rfl: 3   metoprolol succinate (TOPROL-XL) 50 MG 24 hr tablet, TAKE 1 TABLET BY MOUTH DAILY. TAKE WITH OR IMMEDIATELY FOLLOWING A MEAL., Disp: 90 tablet, Rfl: 3   pantoprazole (PROTONIX) 40 MG tablet, Take 1 tablet (40 mg total) by mouth daily., Disp: 90 tablet, Rfl: 0   apixaban (ELIQUIS) 5 MG TABS tablet, Take 1 tablet (5 mg total) by mouth every 12 (twelve) hours, Disp: 90 tablet, Rfl: 7   sitaGLIPtin (JANUVIA) 100 MG tablet, TAKE 1 TABLET (100 MG TOTAL) BY MOUTH DAILY. (Patient not taking: Reported on 01/04/2021), Disp: 90 tablet, Rfl: 3   valACYclovir (VALTREX) 1000 MG tablet, take 1 to 2 tablets by mouth every 12 hours for 2 to 3 days as needed for fever blisters (Patient not taking: Reported on 01/04/2021), Disp: 90 tablet, Rfl: 3   Physical exam:  Vitals:   01/04/21 1041  BP: 129/84  Pulse: 66  Resp: 20  Temp: 98.1 F (36.7 C)  SpO2: 100%  Weight: 237 lb 14.4 oz (107.9 kg)   Physical Exam Constitutional:      General: He is not in acute distress. Eyes:     Pupils: Pupils are equal, round, and reactive to light.  Cardiovascular:     Rate and Rhythm: Normal rate and regular rhythm.     Heart sounds: Normal heart sounds.  Pulmonary:     Effort: Pulmonary effort is normal.     Breath sounds: Normal breath sounds.  Abdominal:     General: Bowel sounds are normal.     Palpations: Abdomen is soft.  Skin:    General: Skin is warm and dry.  Neurological:     Mental Status: He is alert and oriented to person, place, and time.       CMP Latest Ref Rng & Units 01/01/2021  Glucose 70 - 99 mg/dL 124(H)  BUN 8 - 23 mg/dL 19  Creatinine 0.61 - 1.24 mg/dL 1.19  Sodium 135 - 145 mmol/L 138  Potassium 3.5 - 5.1 mmol/L 4.0  Chloride 98 - 111 mmol/L 106  CO2 22 - 32 mmol/L 23  Calcium 8.9 - 10.3 mg/dL 8.9  Total Protein 6.5 - 8.1 g/dL -  Total Bilirubin 0.3 - 1.2 mg/dL -  Alkaline Phos 38 - 126 U/L -  AST 15 - 41 U/L -  ALT 0 - 44 U/L -   CBC Latest Ref Rng & Units 01/01/2021  WBC 4.0 - 10.5 K/uL 9.7  Hemoglobin 13.0 - 17.0 g/dL 13.5  Hematocrit 39.0 - 52.0 % 40.1  Platelets 150 - 400 K/uL 250    No images are attached to the encounter.  CT Angio Head W or Wo Contrast  Result Date: 12/17/2020 CLINICAL DATA:  Left-sided weakness EXAM: CT ANGIOGRAPHY HEAD AND NECK TECHNIQUE: Multidetector CT imaging of the head and neck was performed using the standard protocol during bolus administration of intravenous contrast. Multiplanar CT image reconstructions and MIPs were obtained to evaluate the vascular anatomy. Carotid stenosis measurements (when applicable) are obtained utilizing NASCET criteria, using the distal internal carotid diameter as the denominator. CONTRAST:  53mL OMNIPAQUE IOHEXOL 350 MG/ML SOLN COMPARISON:  07/31/2019 FINDINGS: CTA NECK FINDINGS Aortic arch: Standard branching. Imaged portion shows no evidence of aneurysm or dissection. No significant stenosis of the major arch vessel origins. Right carotid system: Mixed density plaque at the bifurcation without flow limiting stenosis or ulceration. Left carotid system: Mixed density plaque at the bifurcation without flow limiting stenosis or ulceration. Vertebral arteries: No proximal subclavian stenosis. Strong left vertebral artery dominance. Skeleton: Degenerative endplate and facet spurring. Other neck: Negative Upper chest: Coronary calcification. Review of the MIP images confirms the above findings CTA HEAD FINDINGS Anterior circulation: Atheromatous calcification affecting the carotid siphons. Abrupt cut off at the distal right M1 segment with some downstream branch reconstitution. Negative for aneurysm Posterior circulation: Basilar is fed solely by the left vertebral artery. Fetal type bilateral PCA flow. No branch occlusion, beading, or aneurysm. Venous sinuses:  Unremarkable for the arterial phase Anatomic variants: As above Review of the MIP images confirms the above findings Critical Value/emergent results were called by telephone at the time of interpretation on 12/17/2020 at 5:24 am to provider [facility] , who verbally acknowledged these results. IMPRESSION: 1. Emergent large vessel occlusion at the right M1 segment. 2. Nonobstructive atherosclerosis of the cervical and intracranial carotids. Electronically Signed   By: Jorje Guild M.D.   On: 12/17/2020 05:30   CT HEAD WO CONTRAST (5MM)  Result Date: 01/06/2021 CLINICAL DATA:  Stroke.  Follow-up. EXAM: CT HEAD WITHOUT CONTRAST TECHNIQUE: Contiguous axial images were obtained from the base of the skull through the vertex without intravenous contrast. COMPARISON:  CT head without contrast 12/30/2020 FINDINGS: Brain: There is continued decreased conspicuity of hyperdense regions within the right hemisphere focal cortical area in the right frontal operculum is decreased in size. Blood in the posterior right sylvian fissure is decreased. No new areas of hemorrhage are present. A remote infarct is present in the high right frontal lobe. Expected evolution of infarct involving the more inferior right frontal lobe and a right insular cortex. No significant extension of the infarct is present. No mass effect is present. The ventricles are of normal size. No significant extraaxial fluid collection is present. A remote nonhemorrhagic infarct in the right cerebellum is again noted. Vascular: Atherosclerotic calcifications are present within the cavernous internal carotid arteries. No hyperdense vessel is present. Skull: Calvarium is intact. No focal lytic or blastic lesions are present. Sinuses/Orbits: A polyp or mucous retention cyst is present the right maxillary sinus. The paranasal sinuses and mastoid air cells are otherwise clear. The globes and orbits are within normal limits. IMPRESSION: 1. Continued decreased  conspicuity of hyperdense regions within the right hemisphere compatible with resolving areas of hemorrhage. 2. Expected evolution of infarct involving the more inferior right frontal lobe, right insular cortex and right temporal lobe. 3. No significant extension of the infarct. 4. Remote infarct in the high right frontal lobe. 5. Remote nonhemorrhagic infarct of the right cerebellum. 6. Atherosclerosis. Electronically Signed   By: San Morelle M.D.   On: 01/06/2021 15:30   CT Head Wo Contrast  Result Date: 12/30/2020 CLINICAL DATA:  Stroke follow-up EXAM: CT HEAD WITHOUT CONTRAST TECHNIQUE: Contiguous axial images were obtained from the base of the skull through the vertex without intravenous contrast. COMPARISON:  Head CT 12/30/2020 FINDINGS: Brain: Decreased conspicuity recently identified areas small volume hemorrhage in the right hemisphere. Hypodensity of the inferior right frontal lobe is unchanged. Unchanged appearance of old bilateral cerebellar infarcts. No midline shift or other mass effect. Vascular: No abnormal hyperdensity of the major intracranial arteries or dural venous sinuses. No intracranial atherosclerosis. Skull: The visualized skull base, calvarium and extracranial soft tissues are normal. Sinuses/Orbits: No fluid levels or advanced mucosal thickening of the visualized paranasal sinuses. No mastoid or middle ear effusion. The orbits are normal. IMPRESSION: 1. Decreased conspicuity of recently identified areas of small volume hemorrhage in the right hemisphere. 2. Unchanged appearance of inferior right frontal lobe infarct. 3. Unchanged appearance of old bilateral cerebellar infarcts. Electronically Signed   By: Ulyses Jarred M.D.   On: 12/30/2020 20:04   CT HEAD WO CONTRAST (5MM)  Result Date: 12/30/2020 CLINICAL DATA:  Headache, uncomplicated (Ped 1-61W) Eval for bleeding, mild headache 4 days, INR > 10 with recent stroke. EXAM: CT HEAD WITHOUT CONTRAST TECHNIQUE: Contiguous  axial images were obtained from the base of the skull through the vertex without intravenous contrast. COMPARISON:  12/17/2020. FINDINGS: Brain: Evolving infarcts within the right MCA territory. Multiple small acute hemorrhages in the region of evolving infarct in the right frontal and temporal lobes. For example, small hemorrhage in the right frontal lobe (series 3, image 19) and small hemorrhage in the superior right temporal lobe (series 5, image 21). Additional faint acute hemorrhage in the inferior right frontal lobe (series 3, image 14). Edema in the inferior right frontal lobe is increased without midline shift. Remote right frontal and bilateral cerebellar infarcts. No hydrocephalus. No mass lesion. Vascular: Hyperdense vessel identified. Calcific intracranial atherosclerosis. Skull: No acute fracture. Sinuses/Orbits: Partially imaged right maxillary sinus retention cyst. Unremarkable orbits. Other: No mastoid effusions. IMPRESSION: 1. Multiple new small acute intraparenchymal hemorrhages within the right frontal and temporal lobes in the region of the evolving right MCA territory infarct, detailed above. 2. Edema in the inferior right frontal lobe is increased without midline shift. 3. Chronic right frontal and bilateral cerebellar infarcts. Findings discussed with Dr. Quinn Axe via telephone at 1:57 PM. Also discussed with Dr. Jacqualine Code at 2:00 PM. Electronically Signed   By: Margaretha Sheffield M.D.   On: 12/30/2020 14:10   CT Angio Neck W and/or Wo Contrast  Result Date: 12/17/2020 CLINICAL DATA:  Left-sided weakness EXAM: CT ANGIOGRAPHY HEAD AND NECK TECHNIQUE: Multidetector CT imaging of the head and neck was performed using the standard protocol during bolus administration of intravenous contrast. Multiplanar CT image reconstructions and MIPs were obtained to evaluate the vascular anatomy. Carotid stenosis measurements (when applicable) are obtained utilizing NASCET criteria, using the distal internal  carotid diameter as the denominator. CONTRAST:  40mL OMNIPAQUE IOHEXOL 350 MG/ML SOLN COMPARISON:  07/31/2019 FINDINGS: CTA NECK FINDINGS Aortic arch: Standard  branching. Imaged portion shows no evidence of aneurysm or dissection. No significant stenosis of the major arch vessel origins. Right carotid system: Mixed density plaque at the bifurcation without flow limiting stenosis or ulceration. Left carotid system: Mixed density plaque at the bifurcation without flow limiting stenosis or ulceration. Vertebral arteries: No proximal subclavian stenosis. Strong left vertebral artery dominance. Skeleton: Degenerative endplate and facet spurring. Other neck: Negative Upper chest: Coronary calcification. Review of the MIP images confirms the above findings CTA HEAD FINDINGS Anterior circulation: Atheromatous calcification affecting the carotid siphons. Abrupt cut off at the distal right M1 segment with some downstream branch reconstitution. Negative for aneurysm Posterior circulation: Basilar is fed solely by the left vertebral artery. Fetal type bilateral PCA flow. No branch occlusion, beading, or aneurysm. Venous sinuses: Unremarkable for the arterial phase Anatomic variants: As above Review of the MIP images confirms the above findings Critical Value/emergent results were called by telephone at the time of interpretation on 12/17/2020 at 5:24 am to provider [facility] , who verbally acknowledged these results. IMPRESSION: 1. Emergent large vessel occlusion at the right M1 segment. 2. Nonobstructive atherosclerosis of the cervical and intracranial carotids. Electronically Signed   By: Jorje Guild M.D.   On: 12/17/2020 05:30   MR ANGIO HEAD WO CONTRAST  Result Date: 12/17/2020 CLINICAL DATA:  Stroke follow-up. Status post thrombectomy for treatment of M1 occlusion. EXAM: MRI HEAD WITHOUT CONTRAST MRA HEAD WITHOUT CONTRAST TECHNIQUE: Multiplanar, multi-echo pulse sequences of the brain and surrounding structures  were acquired without intravenous contrast. Angiographic images of the Circle of Willis were acquired using MRA technique without intravenous contrast. COMPARISON:  Head CT and CTA 12/17/2020.  Head MRI 08/01/2019. FINDINGS: MRI HEAD FINDINGS Brain: There is a small to moderate-sized acute right MCA infarct involving portions of the temporal, frontal, and parietal lobes and insula with associated petechial hemorrhage. A small chronic right frontal cortical infarct is again noted, and there are chronic bilateral cerebellar infarcts with associated chronic blood products. The ventricles are normal in size. No mass, midline shift, or extra-axial fluid collection is identified. Vascular: More fully evaluated below. Skull and upper cervical spine: Unremarkable bone marrow signal. Sinuses/Orbits: Unremarkable orbits. Mild mucosal thickening in the paranasal sinuses. Small right and trace left mastoid effusions. Other: None. MRA HEAD FINDINGS Anterior circulation: The internal carotid arteries are widely patent from skull base to carotid termini. ACAs and MCAs are patent without evidence of a proximal branch occlusion or significant proximal stenosis. Specifically, the recanalized right M1 segment remains patent without an underlying stenosis. No aneurysm is identified. Posterior circulation: The included intracranial portion of the left vertebral artery is widely patent and supplies the basilar. The right vertebral artery is not visualized and was shown to be hypoplastic on the earlier CTA. The basilar artery is widely patent. There is a large right posterior communicating artery. 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 No significant proximal PCA stenosis is evident. No aneurysm is identified. Anatomic variants: As above. IMPRESSION: 1. Small to moderate-sized acute right MCA  infarct. 2. Chronic right frontal and bilateral cerebellar infarcts. 3. Negative head MRA. Electronically Signed   By: Logan Bores M.D.   On: 12/17/2020 14:55   MR BRAIN WO CONTRAST  Result Date: 12/17/2020 CLINICAL DATA:  Stroke follow-up. Status post thrombectomy for treatment of M1 occlusion. EXAM: MRI HEAD WITHOUT CONTRAST MRA HEAD WITHOUT CONTRAST  TECHNIQUE: Multiplanar, multi-echo pulse sequences of the brain and surrounding structures were acquired without intravenous contrast. Angiographic images of the Circle of Willis were acquired using MRA technique without intravenous contrast. COMPARISON:  Head CT and CTA 12/17/2020.  Head MRI 08/01/2019. FINDINGS: MRI HEAD FINDINGS Brain: There is a small to moderate-sized acute right MCA infarct involving portions of the temporal, frontal, and parietal lobes and insula with associated petechial hemorrhage. A small chronic right frontal cortical infarct is again noted, and there are chronic bilateral cerebellar infarcts with associated chronic blood products. The ventricles are normal in size. No mass, midline shift, or extra-axial fluid collection is identified. Vascular: More fully evaluated below. Skull and upper cervical spine: Unremarkable bone marrow signal. Sinuses/Orbits: Unremarkable orbits. Mild mucosal thickening in the paranasal sinuses. Small right and trace left mastoid effusions. Other: None. MRA HEAD FINDINGS Anterior circulation: The internal carotid arteries are widely patent from skull base to carotid termini. ACAs and MCAs are patent without evidence of a proximal branch occlusion or significant proximal stenosis. Specifically, the recanalized right M1 segment remains patent without an underlying stenosis. No aneurysm is identified. Posterior circulation: The included intracranial portion of the left vertebral artery is widely patent and supplies the basilar. The right vertebral artery is not visualized and was shown to be hypoplastic on the  earlier CTA. The basilar artery is widely patent. There is a large right posterior communicating artery. 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 No significant proximal PCA stenosis is evident. No aneurysm is identified. Anatomic variants: As above. IMPRESSION: 1. Small to moderate-sized acute right MCA infarct. 2. Chronic right frontal and bilateral cerebellar infarcts. 3. Negative head MRA. Electronically Signed   By: Logan Bores M.D.   On: 12/17/2020 14:55   IR CT Head Ltd  Result Date: 12/19/2020 INDICATION: 60 year old male with a history acute stroke right M1/right MCA syndrome, presents for angiogram and possible mechanical thrombectomy EXAM: ULTRASOUND-GUIDED ACCESS RIGHT COMMON FEMORAL ARTERY CERVICAL AND CEREBRAL ANGIOGRAM MECHANICAL THROMBECTOMY RIGHT MCA FLAT PANEL CT ANGIO-SEAL FOR HEMOSTASIS COMPARISON:  CT IMAGING SAME DAY MEDICATIONS: None ANESTHESIA/SEDATION: The anesthesia team was present to provide general endotracheal tube anesthesia and for patient monitoring during the procedure. Intubation was performed in room 2 neuro IR. Left radial arterial line was performed by the anesthesia team. Interventional neuro radiology nursing staff was also present. CONTRAST:  90 cc Omni 240 FLUOROSCOPY TIME:  Fluoroscopy Time: 19 minutes 6 seconds (1588 mGy). COMPLICATIONS: SIR LEVEL B - Normal therapy, includes overnight admission for observation. Subarachnoid hemorrhage TECHNIQUE: Informed written consent was obtained from the patient's family after a thorough discussion of the procedural risks, benefits and alternatives. Specific risks discussed include: Bleeding, infection, contrast reaction, kidney injury/failure, need for further procedure/surgery, arterial injury or dissection, embolization to new territory, intracranial hemorrhage (10-15% risk), neurologic  deterioration, cardiopulmonary collapse, death. All questions were addressed. Maximal Sterile Barrier Technique was utilized including during the procedure including caps, mask, sterile gowns, sterile gloves, sterile drape, hand hygiene and skin antiseptic. A timeout was performed prior to the initiation of the procedure. The anesthesia team was present to provide general endotracheal tube anesthesia and for patient monitoring during the procedure. Interventional neuro radiology nursing staff was also present. FINDINGS: Initial Findings: Right common carotid artery:  Normal course caliber and contour. Right external carotid artery: Patent with antegrade flow. Right internal carotid artery: Normal course  caliber and contour of the cervical portion. Vertical and petrous segment patent with normal course caliber contour. Cavernous segment patent. Clinoid segment patent. Antegrade flow of the ophthalmic artery. Ophthalmic segment patent. Terminus patent. Right MCA: Occlusion was present at the branch point of superior and inferior. There had been migration of the M1 thrombus distally to the branch point, when compared to the baseline CT angiogram. The embolus had moved primarily into the inferior division, with flow maintained into the superior division on the initial angiogram. Right ACA: A 1 segment patent. Full perfusion of the right ACA territory with crossover flow into the left ACA territory via patent anterior communicating artery. Completion Findings: Right MCA: Restoration of flow through the right MCA, into the inferior and superior division branch point. The fragmented embolus entered the inferior division. The inferior division is the dominant division, with perfusion of the temporal lobe and the parietal lobe, with the superior segment perfusing essentially the frontal region only. After treatment, there is restoration of the proximal flow, with slow flow within distal branches of the inferior division,  affecting both parietal and temporal lobes, with TICI 2c flow: Near complete perfusion except for slow flow in a few distal cortical vessels/presence of small distal cortical emboli Left: Left common carotid artery:  Normal course caliber and contour. Left external carotid artery: Patent with antegrade flow. Left internal carotid artery: Normal course caliber and contour of the cervical portion. Mild atherosclerotic plaque at the origin of the left ICA without significant stenosis. Vertical and petrous segment patent with normal course caliber contour. Cavernous segment patent. Clinoid segment patent. Antegrade flow of the ophthalmic artery. Ophthalmic segment patent. Terminus patent. Left MCA: M1 segment patent. Insular and opercular segments patent. Unremarkable caliber and course of the cortical segments. Typical arterial, capillary/ parenchymal, and venous phase. Left ACA: A 1 segment patent. A 2 segment perfuses the right ACA territory. Flat panel CT: Small volume subarachnoid hemorrhage within the right sulci, [place] IIIC PROCEDURE: The anesthesia team was present to provide general endotracheal tube anesthesia and for patient monitoring during the procedure. Intubation was performed in negative pressure [place] in neuro IR holding. Interventional neuro radiology nursing staff was also present. Ultrasound survey of the right inguinal region was performed with images stored and sent to PACs. 11 blade scalpel was used to make a small incision. Blunt dissection was performed with US guidance. A micropuncture needle was used access the right common femoral artery under ultrasound. With excellent arterial blood flow returned, an .018 micro wire was passed through the needle, observed to enter the abdominal aorta under fluoroscopy. The needle was removed, and a micropuncture sheath was placed over the wire. The inner dilator and wire were removed, and an 035 wire was advanced under fluoroscopy into the abdominal  aorta. The sheath was removed and a 25cm 48F straight vascular sheath was placed. The dilator was removed and the sheath was flushed. Sheath was attached to pressurized and heparinized saline bag for constant forward flow. A coaxial system was then advanced over the 035 wire. This included a 95cm 087 "Walrus" balloon guide with coaxial 125cm Berenstein diagnostic catheter. This was advanced to the proximal descending thoracic aorta. Wire was then removed. Double flush of the catheter was performed. Catheter was then used to select the innominate artery. Angiogram was performed. Using roadmap technique, the catheter was advanced over a standard glide wire into right cervical ICA, with distal position achieved of the balloon guide. The diagnostic catheter and the wire were  removed. Formal angiogram was performed. Road map function was used once the occluded vessel was identified. Copious back flush was performed and the balloon catheter was attached to heparinized and pressurized saline bag for forward flow. A second coaxial system was then advanced through the balloon catheter, which included the selected intermediate catheter, microcatheter, and microwire. In this scenario, the set up included a zoom 71 aspiration catheter, a Trevo Provue18 microcatheter, and 014 synchro soft wire. This system was advanced through the balloon guide catheter under the road-map function, with adequate back-flush at the rotating hemostatic valve at that back end of the balloon guide. Once the wire and microcatheter were into the laceral segment, we attach heparinized saline pressured flush to the guide. Microcatheter and the intermediate catheter system were advanced through the terminal ICA and MCA gently to the proximal M1 segment, and were advanced up to but not through the occluded segment. The zoom catheter was then advanced over the microwire into the proximal M1 segment. The microcatheter microwire were then removed. The  proprietary engine was attached to the hub of the aspiration catheter confirming return of flow. Catheter was then gently advanced into the embolus/thrombus at the site of occlusion. Approximately 1 minute of time was observed and then the catheter was withdrawn. Catheter was withdrawn into the balloon guide with free aspiration performed at the hub of the aspiration catheter. Angiogram was performed, which confirmed restoration of flow through the proximal inferior segment, with distal migration of the thrombus into a distal branch. We elected to attempt a second pass. 2nd pass: Coaxial system was advanced through the balloon guide, which included a zoom 71 aspiration catheter, a Trevo Provue18 microcatheter, and 014 synchro soft wire. The system was advanced through the balloon guide catheter under the road-map function, with adequate back-flush at the rotating hemostatic valve at that back end of the balloon guide. Microcatheter and the intermediate catheter system were advanced through the terminal ICA and MCA to the level of the bifurcation of superior/inferior division. The micro wire was then carefully advanced into the inferior segment and through the occluded segment. Microcatheter was then manipulated through the occluded segment and the wire was removed with saline drip at the hub. Blood was then aspirated through the hub of the microcatheter, and a gentle contrast injection was performed confirming intraluminal position within the inferior division. A rotating hemostatic valve was then attached to the back end of the microcatheter, and a pressurized and heparinized saline bag was attached to the catheter. 4 x 40 solitaire device was then selected. Back flush was achieved at the rotating hemostatic valve, and then the device was gently advanced through the microcatheter to the distal end. The retriever was then unsheathed by withdrawing the microcatheter under fluoroscopy. Once the retriever was  completely unsheathed, the microcatheter was carefully stripped from the delivery device. Control angiogram was performed from the intermediate catheter. A 3 minute time interval was observed. Constant aspiration using the proprietary engine was then performed at the intermediate catheter, as the retriever was gently and slowly withdrawn with fluoroscopic observation. Once the retriever was "corked" within the tip of the intermediate catheter, both were removed from the system. Free aspiration was confirmed at the hub of the balloon guide catheter, with free blood return confirmed. The balloon was then deflated, and a control angiogram was performed. Improved flow, with again distal migration within the inferior division. We elected to attempt a third pass. 3rd pass: The 014 synchro soft wire was loaded through  a zoom 35 aspiration catheter. Roadmap angiogram was performed. The aspiration catheter and the microwire were then advanced to the inferior segment, just proximal to the occlusion site. Once the aspiration catheter was just proximal to the occlusion site the microwire was removed. The rotating hemostatic valve was then removed from the hub, and the proprietary engine was attached directly to the hub of the zoom 35 catheter the pump was turned on confirming backflow and the catheter was advanced until cessation of flow was observed. Catheter was advanced through the site of the occlusion and then withdrawn. Upon withdrawal, aspiration was performed at the hub of the balloon guide catheter. Once the aspiration catheter was completely removed, aspiration was performed at the hub of the balloon guide confirming free return of blood. Angiogram was performed. There was improved flow through the site of the occlusion, however, again distal migration was observed within the affected division, with again distal migration within the inferior division. We elected 1 final pass into the inferior division M3/M4 with the  microcatheter. Fourth pass: A roadmap angiogram was performed. Through the balloon guide, a 160 cm Trevo Trak microcatheter was advanced with the synchro soft, into the distal aspect of the inferior division, targeting the residual, most proximal occlusive embolus of the inferior division branches. Once the microcatheter was distal to the site of the occlusion, the microwire was removed. Catheter aspiration was performed as the catheter was then withdrawn. Catheter was completely withdrawn from the system. Angiogram performed demonstrated persistent occlusion in the distal M3/M4 cortical branch of the inferior division. At this time we elected to terminate the case. Balloon guide was withdrawn. A JB 1 catheter was used to select the origin of the left CCA. Angiogram was performed. JB 1 catheter was then removed. The skin at the puncture site was then cleaned with Chlorhexidine. The 8 French sheath was removed and an 48F angioseal was deployed. Flat panel CT was performed. Patient was extubated once the CT was reviewed. Patient tolerated the procedure well and remained hemodynamically stable throughout. No complications were encountered and no significant blood loss encountered. IMPRESSION: Status post ultrasound guided access right common femoral artery for mechanical thrombectomy of right-sided MCA ELVO, with 4 passes achieving TICI 2c flow at the completion. Angiogram of the left ICA demonstrates no occlusive branches intracranial. Angio-Seal restored for hemostasis. Signed, Dulcy Fanny. Dellia Nims, RPVI [facility] Radiology PLAN: Patient extubated ICU status Target systolic blood pressure of 120-140 Right hip straight time 4 hours Frequent neurovascular checks Repeat neurologic imaging with CT and/MRI at the discretion of neurology team Electronically Signed   By: Corrie Mckusick D.O.   On: 12/18/2020 21:30   IR US Guide Vasc Access Right  Result Date:  12/19/2020 INDICATION: 60 year old male with a history acute stroke right M1/right MCA syndrome, presents for angiogram and possible mechanical thrombectomy EXAM: ULTRASOUND-GUIDED ACCESS RIGHT COMMON FEMORAL ARTERY CERVICAL AND CEREBRAL ANGIOGRAM MECHANICAL THROMBECTOMY RIGHT MCA FLAT PANEL CT ANGIO-SEAL FOR HEMOSTASIS COMPARISON:  CT IMAGING SAME DAY MEDICATIONS: None ANESTHESIA/SEDATION: The anesthesia team was present to provide general endotracheal tube anesthesia and for patient monitoring during the procedure. Intubation was performed in room 2 neuro IR. Left radial arterial line was performed by the anesthesia team. Interventional neuro radiology nursing staff was also present. CONTRAST:  90 cc Omni 240 FLUOROSCOPY TIME:  Fluoroscopy Time: 19 minutes 6 seconds (1588 mGy). COMPLICATIONS: SIR LEVEL B - Normal therapy, includes overnight admission for observation. Subarachnoid hemorrhage TECHNIQUE: Informed written  consent was obtained from the patient's family after a thorough discussion of the procedural risks, benefits and alternatives. Specific risks discussed include: Bleeding, infection, contrast reaction, kidney injury/failure, need for further procedure/surgery, arterial injury or dissection, embolization to new territory, intracranial hemorrhage (10-15% risk), neurologic deterioration, cardiopulmonary collapse, death. All questions were addressed. Maximal Sterile Barrier Technique was utilized including during the procedure including caps, mask, sterile gowns, sterile gloves, sterile drape, hand hygiene and skin antiseptic. A timeout was performed prior to the initiation of the procedure. The anesthesia team was present to provide general endotracheal tube anesthesia and for patient monitoring during the procedure. Interventional neuro radiology nursing staff was also present. FINDINGS: Initial Findings: Right common carotid artery:  Normal course caliber and contour. Right external carotid artery:  Patent with antegrade flow. Right internal carotid artery: Normal course caliber and contour of the cervical portion. Vertical and petrous segment patent with normal course caliber contour. Cavernous segment patent. Clinoid segment patent. Antegrade flow of the ophthalmic artery. Ophthalmic segment patent. Terminus patent. Right MCA: Occlusion was present at the branch point of superior and inferior. There had been migration of the M1 thrombus distally to the branch point, when compared to the baseline CT angiogram. The embolus had moved primarily into the inferior division, with flow maintained into the superior division on the initial angiogram. Right ACA: A 1 segment patent. Full perfusion of the right ACA territory with crossover flow into the left ACA territory via patent anterior communicating artery. Completion Findings: Right MCA: Restoration of flow through the right MCA, into the inferior and superior division branch point. The fragmented embolus entered the inferior division. The inferior division is the dominant division, with perfusion of the temporal lobe and the parietal lobe, with the superior segment perfusing essentially the frontal region only. After treatment, there is restoration of the proximal flow, with slow flow within distal branches of the inferior division, affecting both parietal and temporal lobes, with TICI 2c flow: Near complete perfusion except for slow flow in a few distal cortical vessels/presence of small distal cortical emboli Left: Left common carotid artery:  Normal course caliber and contour. Left external carotid artery: Patent with antegrade flow. Left internal carotid artery: Normal course caliber and contour of the cervical portion. Mild atherosclerotic plaque at the origin of the left ICA without significant stenosis. Vertical and petrous segment patent with normal course caliber contour. Cavernous segment patent. Clinoid segment patent. Antegrade flow of the ophthalmic  artery. Ophthalmic segment patent. Terminus patent. Left MCA: M1 segment patent. Insular and opercular segments patent. Unremarkable caliber and course of the cortical segments. Typical arterial, capillary/ parenchymal, and venous phase. Left ACA: A 1 segment patent. A 2 segment perfuses the right ACA territory. Flat panel CT: Small volume subarachnoid hemorrhage within the right sulci, [place] IIIC PROCEDURE: The anesthesia team was present to provide general endotracheal tube anesthesia and for patient monitoring during the procedure. Intubation was performed in negative pressure [place] in neuro IR holding. Interventional neuro radiology nursing staff was also present. Ultrasound survey of the right inguinal region was performed with images stored and sent to PACs. 11 blade scalpel was used to make a small incision. Blunt dissection was performed with US guidance. A micropuncture needle was used access the right common femoral artery under ultrasound. With excellent arterial blood flow returned, an .018 micro wire was passed through the needle, observed to enter the abdominal aorta under fluoroscopy. The needle was removed, and a micropuncture sheath was placed over the wire.  The inner dilator and wire were removed, and an 035 wire was advanced under fluoroscopy into the abdominal aorta. The sheath was removed and a 25cm 50F straight vascular sheath was placed. The dilator was removed and the sheath was flushed. Sheath was attached to pressurized and heparinized saline bag for constant forward flow. A coaxial system was then advanced over the 035 wire. This included a 95cm 087 "Walrus" balloon guide with coaxial 125cm Berenstein diagnostic catheter. This was advanced to the proximal descending thoracic aorta. Wire was then removed. Double flush of the catheter was performed. Catheter was then used to select the innominate artery. Angiogram was performed. Using roadmap technique, the catheter was advanced over a  standard glide wire into right cervical ICA, with distal position achieved of the balloon guide. The diagnostic catheter and the wire were removed. Formal angiogram was performed. Road map function was used once the occluded vessel was identified. Copious back flush was performed and the balloon catheter was attached to heparinized and pressurized saline bag for forward flow. A second coaxial system was then advanced through the balloon catheter, which included the selected intermediate catheter, microcatheter, and microwire. In this scenario, the set up included a zoom 71 aspiration catheter, a Trevo Provue18 microcatheter, and 014 synchro soft wire. This system was advanced through the balloon guide catheter under the road-map function, with adequate back-flush at the rotating hemostatic valve at that back end of the balloon guide. Once the wire and microcatheter were into the laceral segment, we attach heparinized saline pressured flush to the guide. Microcatheter and the intermediate catheter system were advanced through the terminal ICA and MCA gently to the proximal M1 segment, and were advanced up to but not through the occluded segment. The zoom catheter was then advanced over the microwire into the proximal M1 segment. The microcatheter microwire were then removed. The proprietary engine was attached to the hub of the aspiration catheter confirming return of flow. Catheter was then gently advanced into the embolus/thrombus at the site of occlusion. Approximately 1 minute of time was observed and then the catheter was withdrawn. Catheter was withdrawn into the balloon guide with free aspiration performed at the hub of the aspiration catheter. Angiogram was performed, which confirmed restoration of flow through the proximal inferior segment, with distal migration of the thrombus into a distal branch. We elected to attempt a second pass. 2nd pass: Coaxial system was advanced through the balloon guide, which  included a zoom 71 aspiration catheter, a Trevo Provue18 microcatheter, and 014 synchro soft wire. The system was advanced through the balloon guide catheter under the road-map function, with adequate back-flush at the rotating hemostatic valve at that back end of the balloon guide. Microcatheter and the intermediate catheter system were advanced through the terminal ICA and MCA to the level of the bifurcation of superior/inferior division. The micro wire was then carefully advanced into the inferior segment and through the occluded segment. Microcatheter was then manipulated through the occluded segment and the wire was removed with saline drip at the hub. Blood was then aspirated through the hub of the microcatheter, and a gentle contrast injection was performed confirming intraluminal position within the inferior division. A rotating hemostatic valve was then attached to the back end of the microcatheter, and a pressurized and heparinized saline bag was attached to the catheter. 4 x 40 solitaire device was then selected. Back flush was achieved at the rotating hemostatic valve, and then the device was gently advanced through the microcatheter to  the distal end. The retriever was then unsheathed by withdrawing the microcatheter under fluoroscopy. Once the retriever was completely unsheathed, the microcatheter was carefully stripped from the delivery device. Control angiogram was performed from the intermediate catheter. A 3 minute time interval was observed. Constant aspiration using the proprietary engine was then performed at the intermediate catheter, as the retriever was gently and slowly withdrawn with fluoroscopic observation. Once the retriever was "corked" within the tip of the intermediate catheter, both were removed from the system. Free aspiration was confirmed at the hub of the balloon guide catheter, with free blood return confirmed. The balloon was then deflated, and a control angiogram was  performed. Improved flow, with again distal migration within the inferior division. We elected to attempt a third pass. 3rd pass: The 014 synchro soft wire was loaded through a zoom 35 aspiration catheter. Roadmap angiogram was performed. The aspiration catheter and the microwire were then advanced to the inferior segment, just proximal to the occlusion site. Once the aspiration catheter was just proximal to the occlusion site the microwire was removed. The rotating hemostatic valve was then removed from the hub, and the proprietary engine was attached directly to the hub of the zoom 35 catheter the pump was turned on confirming backflow and the catheter was advanced until cessation of flow was observed. Catheter was advanced through the site of the occlusion and then withdrawn. Upon withdrawal, aspiration was performed at the hub of the balloon guide catheter. Once the aspiration catheter was completely removed, aspiration was performed at the hub of the balloon guide confirming free return of blood. Angiogram was performed. There was improved flow through the site of the occlusion, however, again distal migration was observed within the affected division, with again distal migration within the inferior division. We elected 1 final pass into the inferior division M3/M4 with the microcatheter. Fourth pass: A roadmap angiogram was performed. Through the balloon guide, a 160 cm Trevo Trak microcatheter was advanced with the synchro soft, into the distal aspect of the inferior division, targeting the residual, most proximal occlusive embolus of the inferior division branches. Once the microcatheter was distal to the site of the occlusion, the microwire was removed. Catheter aspiration was performed as the catheter was then withdrawn. Catheter was completely withdrawn from the system. Angiogram performed demonstrated persistent occlusion in the distal M3/M4 cortical branch of the inferior division. At this time we  elected to terminate the case. Balloon guide was withdrawn. A JB 1 catheter was used to select the origin of the left CCA. Angiogram was performed. JB 1 catheter was then removed. The skin at the puncture site was then cleaned with Chlorhexidine. The 8 French sheath was removed and an 27F angioseal was deployed. Flat panel CT was performed. Patient was extubated once the CT was reviewed. Patient tolerated the procedure well and remained hemodynamically stable throughout. No complications were encountered and no significant blood loss encountered. IMPRESSION: Status post ultrasound guided access right common femoral artery for mechanical thrombectomy of right-sided MCA ELVO, with 4 passes achieving TICI 2c flow at the completion. Angiogram of the left ICA demonstrates no occlusive branches intracranial. Angio-Seal restored for hemostasis. Signed, Dulcy Fanny. Dellia Nims, RPVI [facility] Interventional Radiology Specialists [facility] Radiology PLAN: Patient extubated ICU status Target systolic blood pressure of 120-140 Right hip straight time 4 hours Frequent neurovascular checks Repeat neurologic imaging with CT and/MRI at the discretion of neurology team Electronically Signed   By: Corrie Mckusick D.O.   On:  12/18/2020 21:30   MR CARDIAC MORPHOLOGY W WO CONTRAST  Result Date: 12/19/2020 CLINICAL DATA:  Evaluate for LV thrombus EXAM: CARDIAC MRI TECHNIQUE: The patient was scanned on a 1.5 Tesla Siemens magnet. A dedicated cardiac coil was used. Functional imaging was done using Fiesta sequences. 2,3, and 4 chamber views were done to assess for RWMA's. Modified Simpson's rule using a short axis stack was used to calculate an ejection fraction on a dedicated work Conservation officer, nature. The patient received 14 cc of Gadavist. After 10 minutes inversion recovery sequences were used to assess for infiltration and scar tissue. CONTRAST:  14 cc  of Gadavist FINDINGS: Left ventricle: -Normal size -Low normal systolic  function.  Akinesis of apex -LV apical thrombus measuring 90mm x 40mm -Mild nonspecific ECV elevation (30%) -Subendocardial LGE in mid to apical anteroseptum as well as part of the mid to apical anterior wall and the apical inferior wall and apex. LGE >50% transmural at apex, suggesting nonviability. Remainder of myocardium appears viable. LV EF: 51% (Normal 56-78%) Absolute volumes: LV EDV: 140mL (Normal 77-195 mL) LV ESV: 41mL (Normal 19-72 mL) LV SV: 65mL (Normal 51-133 mL) CO: 4.3L/min (Normal 2.8-8.8 L/min) Indexed volumes: LV EDV: 36mL/sq-m (Normal 47-92 mL/sq-m) LV ESV: 37mL/sq-m (Normal 13-30 mL/sq-m) LV SV: 58mL/sq-m (Normal 32-62 mL/sq-m) CI: 1.8L/min/sq-m (Normal 1.7-4.2 L/min/sq-m) Right ventricle: Normal size and systolic function RV EF:  59% (Normal 47-74%) Absolute volumes: RV EDV: 165mL (Normal 88-227 mL) RV ESV: 16mL (Normal 23-103 mL) RV SV: 51mL (Normal 52-138 mL) CO: 4.6L/min (Normal 2.8-8.8 L/min) Indexed volumes: RV EDV: 36mL/sq-m (Normal 55-105 mL/sq-m) RV ESV: 51mL/sq-m (Normal 15-43 mL/sq-m) RV SV: 50mL/sq-m (Normal 32-64 mL/sq-m) CI: 2.0L/min/sq-m (Normal 1.7-4.2 L/min/sq-m) Left atrium: Normal size Right atrium: Normal size Mitral valve: Trivial regurgitation Aortic valve: No regurgitation Tricuspid valve: No regurgitation Pulmonic valve: No regurgitation Aorta: Normal proximal ascending aorta Coronary arteries: Normal origins Pericardium: Normal IMPRESSION: 1. LV apical thrombus measuring 57mm x 34mm 2. Subendocardial late gadolinium enhancement consistent with prior infarct in part of the mid to apical anterior wall, mid anteroseptal wall, apical septal/inferior walls, and apex. LGE is less than 50% transmural in the mid anteroseptal/anterior walls and apical septal/anterior/inferior walls, suggesting viability. LGE is greater than 50% transmural at the apex, suggesting apex is not viable. 3. Normal LV size with low normal systolic function (EF 54%). Akinesis of apex. 4.  Normal RV size  and systolic function (EF 00%) Electronically Signed   By: Oswaldo Milian M.D.   On: 12/19/2020 19:05   ECHOCARDIOGRAM COMPLETE  Result Date: 12/17/2020    ECHOCARDIOGRAM REPORT   Patient Name:   ARNOL MCGIBBON [place] Date of Exam: 12/17/2020 Medical Rec #:  [id number]      Height:       72.0 in Accession #:    [id number]     Weight:       245.1 lb Date of Birth:  1960/05/23      BSA:          2.323 m Patient Age:    71 years       BP:           122/72 mmHg Patient Gender: M              HR:           72 bpm. Exam Location:  Inpatient Procedure: 2D Echo, Cardiac Doppler and Color Doppler Indications:    Stroke 434.91 / I163.9  History:  Patient has prior history of Echocardiogram examinations, most                 recent 08/03/2019. Previous Myocardial Infarction, Stroke, Mitral                 Valve Disease; Risk Factors:Hypertension, Diabetes and                 Dyslipidemia. Substance abuse ([place]) (From Hx).  Sonographer:    Leavy Cella RDCS Sonographer#2:  Alvino Chapel RCS Referring Phys: [phone] MCNEILL P KIRKPATRICK IMPRESSIONS  1. Definity contrast utilized. There is a nonmobile LV mural thrombus (approximately 1.2 x 0.5 cm) in region of akinesis involving the apical septal wall. Reported to covering team at 5:10 PM.  2. Left ventricular ejection fraction, by estimation, is 60 to 65%. The left ventricle has normal function. The left ventricle demonstrates regional wall motion abnormalities (see scoring diagram/findings for description). There is mild left ventricular  hypertrophy. Left ventricular diastolic parameters are indeterminate.  3. Right ventricular systolic function is normal. The right ventricular size is normal. Tricuspid regurgitation signal is inadequate for assessing PA pressure.  4. The mitral valve is grossly normal. Trivial mitral valve regurgitation.  5. The aortic valve is tricuspid. Aortic valve regurgitation is not visualized.  6. The inferior vena cava is normal in size with  greater than 50% respiratory variability, suggesting right atrial pressure of 3 mmHg. Comparison(s): Prior images reviewed side by side. LV mural thrombus newly identified. Prior study from May 2021 did have similar apical septal wall motion abnormality. FINDINGS  Left Ventricle: Left ventricular ejection fraction, by estimation, is 60 to 65%. The left ventricle has normal function. The left ventricle demonstrates regional wall motion abnormalities. Definity contrast agent was given IV to delineate the left ventricular endocardial borders. The left ventricular internal cavity size was normal in size. There is mild left ventricular hypertrophy. Left ventricular diastolic parameters are indeterminate.  LV Wall Scoring: The apical septal segment and apex are akinetic. The apical anterior segment is hypokinetic. The anterior wall, entire lateral wall, anterior septum, entire inferior wall, mid inferoseptal segment, and basal inferoseptal segment are normal. Right Ventricle: The right ventricular size is normal. No increase in right ventricular wall thickness. Right ventricular systolic function is normal. Tricuspid regurgitation signal is inadequate for assessing PA pressure. Left Atrium: Left atrial size was normal in size. Right Atrium: Right atrial size was normal in size. Pericardium: There is no evidence of pericardial effusion. Presence of pericardial fat pad. Mitral Valve: The mitral valve is grossly normal. Trivial mitral valve regurgitation. Tricuspid Valve: The tricuspid valve is grossly normal. Tricuspid valve regurgitation is trivial. Aortic Valve: The aortic valve is tricuspid. There is mild aortic valve annular calcification. Aortic valve regurgitation is not visualized. Pulmonic Valve: The pulmonic valve was not well visualized. Pulmonic valve regurgitation is not visualized. Aorta: The aortic root is normal in size and structure. Venous: The inferior vena cava is normal in size with greater than 50%  respiratory variability, suggesting right atrial pressure of 3 mmHg. IAS/Shunts: No atrial level shunt detected by color flow Doppler.  LEFT VENTRICLE PLAX 2D LVIDd:         5.40 cm  Diastology LVIDs:         3.50 cm  LV e' medial:    6.85 cm/s LV PW:         1.10 cm  LV E/e' medial:  8.9 LV IVS:        1.10  cm  LV e' lateral:   7.83 cm/s LVOT diam:     2.00 cm  LV E/e' lateral: 7.8 LVOT Area:     3.14 cm  RIGHT VENTRICLE RV S prime:     15.90 cm/s TAPSE (M-mode): 2.4 cm LEFT ATRIUM             Index       RIGHT ATRIUM          Index LA diam:        3.50 cm 1.51 cm/m  RA Area:     9.85 cm LA Vol (A2C):   48.8 ml 21.01 ml/m RA Volume:   21.30 ml 9.17 ml/m LA Vol (A4C):   18.1 ml 7.79 ml/m LA Biplane Vol: 30.1 ml 12.96 ml/m   AORTA Ao Root diam: 2.80 cm MITRAL VALVE MV Area (PHT): 3.08 cm    SHUNTS MV Decel Time: 246 msec    Systemic Diam: 2.00 cm MV E velocity: 60.80 cm/s MV A velocity: 78.60 cm/s MV E/A ratio:  0.77 Rozann Lesches MD Electronically signed by Rozann Lesches MD Signature Date/Time: 12/17/2020/5:19:12 PM    Final    IR PERCUTANEOUS ART THROMBECTOMY/INFUSION INTRACRANIAL INC DIAG ANGIO  Result Date: 12/19/2020 INDICATION: 60 year old male with a history acute stroke right M1/right MCA syndrome, presents for angiogram and possible mechanical thrombectomy EXAM: ULTRASOUND-GUIDED ACCESS RIGHT COMMON FEMORAL ARTERY CERVICAL AND CEREBRAL ANGIOGRAM MECHANICAL THROMBECTOMY RIGHT MCA FLAT PANEL CT ANGIO-SEAL FOR HEMOSTASIS COMPARISON:  CT IMAGING SAME DAY MEDICATIONS: None ANESTHESIA/SEDATION: The anesthesia team was present to provide general endotracheal tube anesthesia and for patient monitoring during the procedure. Intubation was performed in room 2 neuro IR. Left radial arterial line was performed by the anesthesia team. Interventional neuro radiology nursing staff was also present. CONTRAST:  90 cc Omni 240 FLUOROSCOPY TIME:  Fluoroscopy Time: 19 minutes 6 seconds (1588 mGy). COMPLICATIONS:  SIR LEVEL B - Normal therapy, includes overnight admission for observation. Subarachnoid hemorrhage TECHNIQUE: Informed written consent was obtained from the patient's family after a thorough discussion of the procedural risks, benefits and alternatives. Specific risks discussed include: Bleeding, infection, contrast reaction, kidney injury/failure, need for further procedure/surgery, arterial injury or dissection, embolization to new territory, intracranial hemorrhage (10-15% risk), neurologic deterioration, cardiopulmonary collapse, death. All questions were addressed. Maximal Sterile Barrier Technique was utilized including during the procedure including caps, mask, sterile gowns, sterile gloves, sterile drape, hand hygiene and skin antiseptic. A timeout was performed prior to the initiation of the procedure. The anesthesia team was present to provide general endotracheal tube anesthesia and for patient monitoring during the procedure. Interventional neuro radiology nursing staff was also present. FINDINGS: Initial Findings: Right common carotid artery:  Normal course caliber and contour. Right external carotid artery: Patent with antegrade flow. Right internal carotid artery: Normal course caliber and contour of the cervical portion. Vertical and petrous segment patent with normal course caliber contour. Cavernous segment patent. Clinoid segment patent. Antegrade flow of the ophthalmic artery. Ophthalmic segment patent. Terminus patent. Right MCA: Occlusion was present at the branch point of superior and inferior. There had been migration of the M1 thrombus distally to the branch point, when compared to the baseline CT angiogram. The embolus had moved primarily into the inferior division, with flow maintained into the superior division on the initial angiogram. Right ACA: A 1 segment patent. Full perfusion of the right ACA territory with crossover flow into the left ACA territory via patent anterior  communicating artery. Completion Findings: Right MCA: Restoration  of flow through the right MCA, into the inferior and superior division branch point. The fragmented embolus entered the inferior division. The inferior division is the dominant division, with perfusion of the temporal lobe and the parietal lobe, with the superior segment perfusing essentially the frontal region only. After treatment, there is restoration of the proximal flow, with slow flow within distal branches of the inferior division, affecting both parietal and temporal lobes, with TICI 2c flow: Near complete perfusion except for slow flow in a few distal cortical vessels/presence of small distal cortical emboli Left: Left common carotid artery:  Normal course caliber and contour. Left external carotid artery: Patent with antegrade flow. Left internal carotid artery: Normal course caliber and contour of the cervical portion. Mild atherosclerotic plaque at the origin of the left ICA without significant stenosis. Vertical and petrous segment patent with normal course caliber contour. Cavernous segment patent. Clinoid segment patent. Antegrade flow of the ophthalmic artery. Ophthalmic segment patent. Terminus patent. Left MCA: M1 segment patent. Insular and opercular segments patent. Unremarkable caliber and course of the cortical segments. Typical arterial, capillary/ parenchymal, and venous phase. Left ACA: A 1 segment patent. A 2 segment perfuses the right ACA territory. Flat panel CT: Small volume subarachnoid hemorrhage within the right sulci, [place] IIIC PROCEDURE: The anesthesia team was present to provide general endotracheal tube anesthesia and for patient monitoring during the procedure. Intubation was performed in negative pressure [place] in neuro IR holding. Interventional neuro radiology nursing staff was also present. Ultrasound survey of the right inguinal region was performed with images stored and sent to PACs. 11 blade scalpel was  used to make a small incision. Blunt dissection was performed with US guidance. A micropuncture needle was used access the right common femoral artery under ultrasound. With excellent arterial blood flow returned, an .018 micro wire was passed through the needle, observed to enter the abdominal aorta under fluoroscopy. The needle was removed, and a micropuncture sheath was placed over the wire. The inner dilator and wire were removed, and an 035 wire was advanced under fluoroscopy into the abdominal aorta. The sheath was removed and a 25cm 35F straight vascular sheath was placed. The dilator was removed and the sheath was flushed. Sheath was attached to pressurized and heparinized saline bag for constant forward flow. A coaxial system was then advanced over the 035 wire. This included a 95cm 087 "Walrus" balloon guide with coaxial 125cm Berenstein diagnostic catheter. This was advanced to the proximal descending thoracic aorta. Wire was then removed. Double flush of the catheter was performed. Catheter was then used to select the innominate artery. Angiogram was performed. Using roadmap technique, the catheter was advanced over a standard glide wire into right cervical ICA, with distal position achieved of the balloon guide. The diagnostic catheter and the wire were removed. Formal angiogram was performed. Road map function was used once the occluded vessel was identified. Copious back flush was performed and the balloon catheter was attached to heparinized and pressurized saline bag for forward flow. A second coaxial system was then advanced through the balloon catheter, which included the selected intermediate catheter, microcatheter, and microwire. In this scenario, the set up included a zoom 71 aspiration catheter, a Trevo Provue18 microcatheter, and 014 synchro soft wire. This system was advanced through the balloon guide catheter under the road-map function, with adequate back-flush at the rotating hemostatic  valve at that back end of the balloon guide. Once the wire and microcatheter were into the laceral segment, we attach  heparinized saline pressured flush to the guide. Microcatheter and the intermediate catheter system were advanced through the terminal ICA and MCA gently to the proximal M1 segment, and were advanced up to but not through the occluded segment. The zoom catheter was then advanced over the microwire into the proximal M1 segment. The microcatheter microwire were then removed. The proprietary engine was attached to the hub of the aspiration catheter confirming return of flow. Catheter was then gently advanced into the embolus/thrombus at the site of occlusion. Approximately 1 minute of time was observed and then the catheter was withdrawn. Catheter was withdrawn into the balloon guide with free aspiration performed at the hub of the aspiration catheter. Angiogram was performed, which confirmed restoration of flow through the proximal inferior segment, with distal migration of the thrombus into a distal branch. We elected to attempt a second pass. 2nd pass: Coaxial system was advanced through the balloon guide, which included a zoom 71 aspiration catheter, a Trevo Provue18 microcatheter, and 014 synchro soft wire. The system was advanced through the balloon guide catheter under the road-map function, with adequate back-flush at the rotating hemostatic valve at that back end of the balloon guide. Microcatheter and the intermediate catheter system were advanced through the terminal ICA and MCA to the level of the bifurcation of superior/inferior division. The micro wire was then carefully advanced into the inferior segment and through the occluded segment. Microcatheter was then manipulated through the occluded segment and the wire was removed with saline drip at the hub. Blood was then aspirated through the hub of the microcatheter, and a gentle contrast injection was performed confirming intraluminal  position within the inferior division. A rotating hemostatic valve was then attached to the back end of the microcatheter, and a pressurized and heparinized saline bag was attached to the catheter. 4 x 40 solitaire device was then selected. Back flush was achieved at the rotating hemostatic valve, and then the device was gently advanced through the microcatheter to the distal end. The retriever was then unsheathed by withdrawing the microcatheter under fluoroscopy. Once the retriever was completely unsheathed, the microcatheter was carefully stripped from the delivery device. Control angiogram was performed from the intermediate catheter. A 3 minute time interval was observed. Constant aspiration using the proprietary engine was then performed at the intermediate catheter, as the retriever was gently and slowly withdrawn with fluoroscopic observation. Once the retriever was "corked" within the tip of the intermediate catheter, both were removed from the system. Free aspiration was confirmed at the hub of the balloon guide catheter, with free blood return confirmed. The balloon was then deflated, and a control angiogram was performed. Improved flow, with again distal migration within the inferior division. We elected to attempt a third pass. 3rd pass: The 014 synchro soft wire was loaded through a zoom 35 aspiration catheter. Roadmap angiogram was performed. The aspiration catheter and the microwire were then advanced to the inferior segment, just proximal to the occlusion site. Once the aspiration catheter was just proximal to the occlusion site the microwire was removed. The rotating hemostatic valve was then removed from the hub, and the proprietary engine was attached directly to the hub of the zoom 35 catheter the pump was turned on confirming backflow and the catheter was advanced until cessation of flow was observed. Catheter was advanced through the site of the occlusion and then withdrawn. Upon withdrawal,  aspiration was performed at the hub of the balloon guide catheter. Once the aspiration catheter was completely removed,  aspiration was performed at the hub of the balloon guide confirming free return of blood. Angiogram was performed. There was improved flow through the site of the occlusion, however, again distal migration was observed within the affected division, with again distal migration within the inferior division. We elected 1 final pass into the inferior division M3/M4 with the microcatheter. Fourth pass: A roadmap angiogram was performed. Through the balloon guide, a 160 cm Trevo Trak microcatheter was advanced with the synchro soft, into the distal aspect of the inferior division, targeting the residual, most proximal occlusive embolus of the inferior division branches. Once the microcatheter was distal to the site of the occlusion, the microwire was removed. Catheter aspiration was performed as the catheter was then withdrawn. Catheter was completely withdrawn from the system. Angiogram performed demonstrated persistent occlusion in the distal M3/M4 cortical branch of the inferior division. At this time we elected to terminate the case. Balloon guide was withdrawn. A JB 1 catheter was used to select the origin of the left CCA. Angiogram was performed. JB 1 catheter was then removed. The skin at the puncture site was then cleaned with Chlorhexidine. The 8 French sheath was removed and an 28F angioseal was deployed. Flat panel CT was performed. Patient was extubated once the CT was reviewed. Patient tolerated the procedure well and remained hemodynamically stable throughout. No complications were encountered and no significant blood loss encountered. IMPRESSION: Status post ultrasound guided access right common femoral artery for mechanical thrombectomy of right-sided MCA ELVO, with 4 passes achieving TICI 2c flow at the completion. Angiogram of the left ICA demonstrates no occlusive branches intracranial.  Angio-Seal restored for hemostasis. Signed, Dulcy Fanny. Dellia Nims, RPVI Vascular and Interventional Radiology Specialists [facility] Radiology PLAN: Patient extubated ICU status Target systolic blood pressure of 120-140 Right hip straight time 4 hours Frequent neurovascular checks Repeat neurologic imaging with CT and/MRI at the discretion of neurology team Electronically Signed   By: Corrie Mckusick D.O.   On: 12/18/2020 21:30   CT HEAD CODE STROKE WO CONTRAST  Result Date: 12/17/2020 CLINICAL DATA:  Code stroke.  Left-sided weakness EXAM: CT HEAD WITHOUT CONTRAST TECHNIQUE: Contiguous axial images were obtained from the base of the skull through the vertex without intravenous contrast. COMPARISON:  08/02/2019 FINDINGS: Brain: Small chronic high right frontal cortex infarct. No convincing acute infarct. No acute hemorrhage. Remote bilateral cerebellar infarcts. No hydrocephalus or mass. Vascular: Hyperdense right M1 segment Skull: Negative Sinuses/Orbits: Negative Other: Critical Value/emergent results were called by telephone at the time of interpretation on 12/17/2020 at 5:24 am to provider [facility] , who verbally acknowledged these results. ASPECTS [facility] Stroke Program Early CT Score) - Ganglionic level infarction (caudate, lentiform nuclei, internal capsule, insula, M1-M3 cortex): 7 - Supraganglionic infarction (M4-M6 cortex): 3 when accounting for chronic cortex infarct Total score (0-10 with 10 being normal): 10 IMPRESSION: 1. Hyperdense right M1 segment, reference CTA. 2. No acute hemorrhage or visible acute infarct. 3. Remote right frontal cortex and bilateral cerebellar infarcts. Electronically Signed   By: Jorje Guild M.D.   On: 12/17/2020 05:25   IR ANGIO INTRA EXTRACRAN SEL COM CAROTID INNOMINATE UNI L MOD SED  Result Date: 12/19/2020 INDICATION: 60 year old male with a history acute stroke right M1/right MCA syndrome, presents for angiogram and possible mechanical thrombectomy EXAM:  ULTRASOUND-GUIDED ACCESS RIGHT COMMON FEMORAL ARTERY CERVICAL AND CEREBRAL ANGIOGRAM MECHANICAL THROMBECTOMY RIGHT MCA FLAT PANEL CT ANGIO-SEAL FOR HEMOSTASIS COMPARISON:  CT IMAGING SAME DAY MEDICATIONS: None ANESTHESIA/SEDATION: The anesthesia team was  present to provide general endotracheal tube anesthesia and for patient monitoring during the procedure. Intubation was performed in room 2 neuro IR. Left radial arterial line was performed by the anesthesia team. Interventional neuro radiology nursing staff was also present. CONTRAST:  90 cc Omni 240 FLUOROSCOPY TIME:  Fluoroscopy Time: 19 minutes 6 seconds (1588 mGy). COMPLICATIONS: SIR LEVEL B - Normal therapy, includes overnight admission for observation. Subarachnoid hemorrhage TECHNIQUE: Informed written consent was obtained from the patient's family after a thorough discussion of the procedural risks, benefits and alternatives. Specific risks discussed include: Bleeding, infection, contrast reaction, kidney injury/failure, need for further procedure/surgery, arterial injury or dissection, embolization to new territory, intracranial hemorrhage (10-15% risk), neurologic deterioration, cardiopulmonary collapse, death. All questions were addressed. Maximal Sterile Barrier Technique was utilized including during the procedure including caps, mask, sterile gowns, sterile gloves, sterile drape, hand hygiene and skin antiseptic. A timeout was performed prior to the initiation of the procedure. The anesthesia team was present to provide general endotracheal tube anesthesia and for patient monitoring during the procedure. Interventional neuro radiology nursing staff was also present. FINDINGS: Initial Findings: Right common carotid artery:  Normal course caliber and contour. Right external carotid artery: Patent with antegrade flow. Right internal carotid artery: Normal course caliber and contour of the cervical portion. Vertical and petrous segment patent with normal  course caliber contour. Cavernous segment patent. Clinoid segment patent. Antegrade flow of the ophthalmic artery. Ophthalmic segment patent. Terminus patent. Right MCA: Occlusion was present at the branch point of superior and inferior. There had been migration of the M1 thrombus distally to the branch point, when compared to the baseline CT angiogram. The embolus had moved primarily into the inferior division, with flow maintained into the superior division on the initial angiogram. Right ACA: A 1 segment patent. Full perfusion of the right ACA territory with crossover flow into the left ACA territory via patent anterior communicating artery. Completion Findings: Right MCA: Restoration of flow through the right MCA, into the inferior and superior division branch point. The fragmented embolus entered the inferior division. The inferior division is the dominant division, with perfusion of the temporal lobe and the parietal lobe, with the superior segment perfusing essentially the frontal region only. After treatment, there is restoration of the proximal flow, with slow flow within distal branches of the inferior division, affecting both parietal and temporal lobes, with TICI 2c flow: Near complete perfusion except for slow flow in a few distal cortical vessels/presence of small distal cortical emboli Left: Left common carotid artery:  Normal course caliber and contour. Left external carotid artery: Patent with antegrade flow. Left internal carotid artery: Normal course caliber and contour of the cervical portion. Mild atherosclerotic plaque at the origin of the left ICA without significant stenosis. Vertical and petrous segment patent with normal course caliber contour. Cavernous segment patent. Clinoid segment patent. Antegrade flow of the ophthalmic artery. Ophthalmic segment patent. Terminus patent. Left MCA: M1 segment patent. Insular and opercular segments patent. Unremarkable caliber and course of the cortical  segments. Typical arterial, capillary/ parenchymal, and venous phase. Left ACA: A 1 segment patent. A 2 segment perfuses the right ACA territory. Flat panel CT: Small volume subarachnoid hemorrhage within the right sulci, [place] IIIC PROCEDURE: The anesthesia team was present to provide general endotracheal tube anesthesia and for patient monitoring during the procedure. Intubation was performed in negative pressure [place] in neuro IR holding. Interventional neuro radiology nursing staff was also present. Ultrasound survey of the right inguinal region was  performed with images stored and sent to PACs. 11 blade scalpel was used to make a small incision. Blunt dissection was performed with US guidance. A micropuncture needle was used access the right common femoral artery under ultrasound. With excellent arterial blood flow returned, an .018 micro wire was passed through the needle, observed to enter the abdominal aorta under fluoroscopy. The needle was removed, and a micropuncture sheath was placed over the wire. The inner dilator and wire were removed, and an 035 wire was advanced under fluoroscopy into the abdominal aorta. The sheath was removed and a 25cm 64F straight vascular sheath was placed. The dilator was removed and the sheath was flushed. Sheath was attached to pressurized and heparinized saline bag for constant forward flow. A coaxial system was then advanced over the 035 wire. This included a 95cm 087 "Walrus" balloon guide with coaxial 125cm Berenstein diagnostic catheter. This was advanced to the proximal descending thoracic aorta. Wire was then removed. Double flush of the catheter was performed. Catheter was then used to select the innominate artery. Angiogram was performed. Using roadmap technique, the catheter was advanced over a standard glide wire into right cervical ICA, with distal position achieved of the balloon guide. The diagnostic catheter and the wire were removed. Formal angiogram was  performed. Road map function was used once the occluded vessel was identified. Copious back flush was performed and the balloon catheter was attached to heparinized and pressurized saline bag for forward flow. A second coaxial system was then advanced through the balloon catheter, which included the selected intermediate catheter, microcatheter, and microwire. In this scenario, the set up included a zoom 71 aspiration catheter, a Trevo Provue18 microcatheter, and 014 synchro soft wire. This system was advanced through the balloon guide catheter under the road-map function, with adequate back-flush at the rotating hemostatic valve at that back end of the balloon guide. Once the wire and microcatheter were into the laceral segment, we attach heparinized saline pressured flush to the guide. Microcatheter and the intermediate catheter system were advanced through the terminal ICA and MCA gently to the proximal M1 segment, and were advanced up to but not through the occluded segment. The zoom catheter was then advanced over the microwire into the proximal M1 segment. The microcatheter microwire were then removed. The proprietary engine was attached to the hub of the aspiration catheter confirming return of flow. Catheter was then gently advanced into the embolus/thrombus at the site of occlusion. Approximately 1 minute of time was observed and then the catheter was withdrawn. Catheter was withdrawn into the balloon guide with free aspiration performed at the hub of the aspiration catheter. Angiogram was performed, which confirmed restoration of flow through the proximal inferior segment, with distal migration of the thrombus into a distal branch. We elected to attempt a second pass. 2nd pass: Coaxial system was advanced through the balloon guide, which included a zoom 71 aspiration catheter, a Trevo Provue18 microcatheter, and 014 synchro soft wire. The system was advanced through the balloon guide catheter under the  road-map function, with adequate back-flush at the rotating hemostatic valve at that back end of the balloon guide. Microcatheter and the intermediate catheter system were advanced through the terminal ICA and MCA to the level of the bifurcation of superior/inferior division. The micro wire was then carefully advanced into the inferior segment and through the occluded segment. Microcatheter was then manipulated through the occluded segment and the wire was removed with saline drip at the hub. Blood was then aspirated  through the hub of the microcatheter, and a gentle contrast injection was performed confirming intraluminal position within the inferior division. A rotating hemostatic valve was then attached to the back end of the microcatheter, and a pressurized and heparinized saline bag was attached to the catheter. 4 x 40 solitaire device was then selected. Back flush was achieved at the rotating hemostatic valve, and then the device was gently advanced through the microcatheter to the distal end. The retriever was then unsheathed by withdrawing the microcatheter under fluoroscopy. Once the retriever was completely unsheathed, the microcatheter was carefully stripped from the delivery device. Control angiogram was performed from the intermediate catheter. A 3 minute time interval was observed. Constant aspiration using the proprietary engine was then performed at the intermediate catheter, as the retriever was gently and slowly withdrawn with fluoroscopic observation. Once the retriever was "corked" within the tip of the intermediate catheter, both were removed from the system. Free aspiration was confirmed at the hub of the balloon guide catheter, with free blood return confirmed. The balloon was then deflated, and a control angiogram was performed. Improved flow, with again distal migration within the inferior division. We elected to attempt a third pass. 3rd pass: The 014 synchro soft wire was loaded through a  zoom 35 aspiration catheter. Roadmap angiogram was performed. The aspiration catheter and the microwire were then advanced to the inferior segment, just proximal to the occlusion site. Once the aspiration catheter was just proximal to the occlusion site the microwire was removed. The rotating hemostatic valve was then removed from the hub, and the proprietary engine was attached directly to the hub of the zoom 35 catheter the pump was turned on confirming backflow and the catheter was advanced until cessation of flow was observed. Catheter was advanced through the site of the occlusion and then withdrawn. Upon withdrawal, aspiration was performed at the hub of the balloon guide catheter. Once the aspiration catheter was completely removed, aspiration was performed at the hub of the balloon guide confirming free return of blood. Angiogram was performed. There was improved flow through the site of the occlusion, however, again distal migration was observed within the affected division, with again distal migration within the inferior division. We elected 1 final pass into the inferior division M3/M4 with the microcatheter. Fourth pass: A roadmap angiogram was performed. Through the balloon guide, a 160 cm Trevo Trak microcatheter was advanced with the synchro soft, into the distal aspect of the inferior division, targeting the residual, most proximal occlusive embolus of the inferior division branches. Once the microcatheter was distal to the site of the occlusion, the microwire was removed. Catheter aspiration was performed as the catheter was then withdrawn. Catheter was completely withdrawn from the system. Angiogram performed demonstrated persistent occlusion in the distal M3/M4 cortical branch of the inferior division. At this time we elected to terminate the case. Balloon guide was withdrawn. A JB 1 catheter was used to select the origin of the left CCA. Angiogram was performed. JB 1 catheter was then removed.  The skin at the puncture site was then cleaned with Chlorhexidine. The 8 French sheath was removed and an 43F angioseal was deployed. Flat panel CT was performed. Patient was extubated once the CT was reviewed. Patient tolerated the procedure well and remained hemodynamically stable throughout. No complications were encountered and no significant blood loss encountered. IMPRESSION: Status post ultrasound guided access right common femoral artery for mechanical thrombectomy of right-sided MCA ELVO, with 4 passes achieving TICI 2c flow  at the completion. Angiogram of the left ICA demonstrates no occlusive branches intracranial. Angio-Seal restored for hemostasis. Signed, Dulcy Fanny. Dellia Nims, RPVI [facility] Radiology PLAN: Patient extubated ICU status Target systolic blood pressure of 120-140 Right hip straight time 4 hours Frequent neurovascular checks Repeat neurologic imaging with CT and/MRI at the discretion of neurology team Electronically Signed   By: Corrie Mckusick D.O.   On: 12/18/2020 21:30    Assessment and plan- Patient is a 60 y.o. male with a history of recent right MCA stroke in the setting of LV thrombus referred for consideration for possible hypercoagulable work-up  Patient's most recent stroke in September 2020 clearly showed a LV thrombus in the region ofApical hypokinesis.  It is likely that his stroke was secondary to this thrombus which has formed in an area of reduced contractility.  However patient states that he has had a prior stroke as well when there was no evidence of LV thrombus.  Discussed with the patient that typically arterial thrombi are not secondary to hypercoagulable disorders and usually LV thrombus is seen in the setting of reduced cardiac contractility.  However given that he had another cryptogenic stroke back in 2016 as well I will proceed with hypercoagulable work-up including testing for antiphospholipid antibody syndrome,  protein C, protein S, Antithrombin III, factor V Leiden and prothrombin gene mutation.  I will also check ANA comprehensive panel.  In person or video visit with the patient in 2 weeks time.  Decision regarding what anticoagulation should be offered and for how long will be deferred to cardiology and neurology.    Thank you for this kind referral and the opportunity to participate in the care of this patient   Visit Diagnosis 1. Recurrent strokes ([place])     Dr. Randa Evens, MD, MPH [facility] [phone] 01/07/2021

## 2021-01-09 LAB — FACTOR 5 LEIDEN

## 2021-01-09 LAB — PROTHROMBIN GENE MUTATION

## 2021-01-10 DIAGNOSIS — H9193 Unspecified hearing loss, bilateral: Secondary | ICD-10-CM | POA: Diagnosis not present

## 2021-01-10 DIAGNOSIS — Z8673 Personal history of transient ischemic attack (TIA), and cerebral infarction without residual deficits: Secondary | ICD-10-CM | POA: Diagnosis not present

## 2021-01-11 ENCOUNTER — Ambulatory Visit: Payer: 59 | Attending: Neurology | Admitting: Speech Pathology

## 2021-01-11 ENCOUNTER — Other Ambulatory Visit: Payer: Self-pay

## 2021-01-11 DIAGNOSIS — R4701 Aphasia: Secondary | ICD-10-CM

## 2021-01-11 NOTE — Therapy (Signed)
[place] MAIN [facility] SERVICES [address] [place], [place], [address] Phone: [phone]   Fax:  [phone]  Speech Language Pathology Treatment  Patient Details  Name: Karl Morgan MRN: [id number] Date of Birth: 17-Oct-1960 Referring Provider (SLP): Rosalin Hawking, MD   Encounter Date: 01/11/2021   End of Session - 01/11/21 1235     Visit Number 2    Number of Visits 25    Date for SLP Re-Evaluation 03/26/21    Authorization - Visit Number 2    Progress Note Due on Visit 10    SLP Start Time 1100    SLP Stop Time  1150    SLP Time Calculation (min) 50 min    Activity Tolerance Patient tolerated treatment well             Past Medical History:  Diagnosis Date   Alopecia 04/04/2011   Diabetes mellitus    Erectile dysfunction 04/04/2011   Heart attack ([place]) 10/07/2014   Overview:  Status post STEMI, anterior myocardial infarction status post DES stent x 2 to the LAD, mid and distal.    Heart murmur    Hyperlipidemia 04/04/2011   Hypogonadism male 04/04/2011   Myocardial infarction [facility])    Stroke [facility])    Substance abuse ([place])    Type II or unspecified type diabetes mellitus without mention of complication, uncontrolled 04/04/2011   Unspecified essential hypertension 04/04/2011    Past Surgical History:  Procedure Laterality Date   BUBBLE STUDY  08/03/2019   Procedure: BUBBLE STUDY;  Surgeon: Fay Records, MD;  Location: [facility] ENDOSCOPY;  Service: Cardiovascular;;   CARDIAC CATHETERIZATION     IR ANGIO INTRA EXTRACRAN SEL COM CAROTID INNOMINATE UNI L MOD SED  12/17/2020   IR CT HEAD LTD  12/17/2020   IR PERCUTANEOUS ART THROMBECTOMY/INFUSION INTRACRANIAL INC DIAG ANGIO  12/17/2020   IR US GUIDE VASC ACCESS RIGHT  12/17/2020   LOOP RECORDER INSERTION N/A 08/03/2019   Procedure: LOOP RECORDER INSERTION;  Surgeon: Evans Lance, MD;  Location: [place] CV LAB;  Service: Cardiovascular;  Laterality: N/A;   RADIOLOGY WITH ANESTHESIA N/A  12/17/2020   Procedure: IR WITH ANESTHESIA;  Surgeon: Luanne Bras, MD;  Location: [place];  Service: Radiology;  Laterality: N/A;   TEE WITHOUT CARDIOVERSION N/A 08/03/2019   Procedure: TRANSESOPHAGEAL ECHOCARDIOGRAM (TEE);  Surgeon: Fay Records, MD;  Location: [place];  Service: Cardiovascular;  Laterality: N/A;    There were no vitals filed for this visit.   Subjective Assessment - 01/11/21 1107     Subjective "This is not a power-through situation."                   ADULT SLP TREATMENT - 01/11/21 1209       General Information   Behavior/Cognition Alert;Pleasant mood;Cooperative    HPI Patient is a 60 year old male admitted at [facility] 12/17/20 s/p R MCA CVA due to M1 occlusion, s/p IR with revascularization, embolic pattern, secondary to LV thrombus. PMH DM, HTN. 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 Evaluated by SLP during acute stay and found to have mild, high-level fluent aphasia. Readmitted 12/30/20 to 01/01/21 and found to have new small acute hemorrhages in right frontal and temporal lobes, LV thrombus.      Treatment Provided   Treatment provided Cognitive-Linquistic      Pain Assessment   Pain Assessment No/denies pain      Cognitive-Linquistic Treatment   Treatment focused  on Aphasia;Patient/family/caregiver education    Skilled Treatment Pt denies change in communication/swallowing function. Saw Dr. Manuella Ghazi yesterday and has been referred for ENT evaluation due to concern for hearing loss. Continues with mild aphasia, paraphasias in conversation. Pt often aware that "something is wrong" but needs assistance correcting (eg, "fusser sools" instead of "suffer fools"). Continued with writing assessment, self-formulation at paragraph level. 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 Required extended time, able to correct 37% of errors independently with re-reading, 75% with min cues. Unaware of 2/8 errors (spelling). Trained pt in using strategy of  verbalizing while writing/typing/editing to increase speed/accuracy while drafting mock email on laptop. Education re: speech-to-text and text-to-speech options to consider for computer and smartphone. Pt returned demonstration for reading texts aloud with text-to-speech.      Assessment / Recommendations / Plan   Plan Continue with current plan of care      Progression Toward Goals   Progression toward goals Progressing toward goals              SLP Education - 01/11/21 1235     Education Details speech-to-text, text-to-speech, read composition aloud to assist with error ID/correction    Person(s) Educated Patient    Methods Explanation    Comprehension Verbalized understanding              SLP Short Term Goals - 12/27/20 1322       SLP SHORT TERM GOAL #1   Title Patient will use compensations for aphasia in 10 minute mod-complex conversation with rare min cues.    Time 10    Period --   sessions   Status New      SLP SHORT TERM GOAL #2   Title Patient will ID verbal errors and attempt correction >90% of the time.    Time 10    Period --   sessions   Status New      SLP SHORT TERM GOAL #3   Title Pt will request repeats of verbal information when auditory breakdowns occur, >90% of the time.    Time 10    Period --   sessions   Status New      SLP SHORT TERM GOAL #4   Title Pt will complete further assessment of reading comprehension.    Time 10    Period --   sessions   Status New              SLP Long Term Goals - 12/27/20 1331       SLP LONG TERM GOAL #1   Title Pt will use compensations for aphasia in 20 minute complex conversation, repairing breakdowns as necessary with minimal listener cues (facial expression).    Time 12    Period Weeks    Status New    Target Date 03/26/21      SLP LONG TERM GOAL #2   Title Pt will recall >90% of key details from 20 minute presentation, using strategies/accessibility features for comprehension  (note-taking, speech-to-text).    Time 12    Period Weeks    Status New    Target Date 03/26/21      SLP LONG TERM GOAL #3   Title Pt will demonstrate reading comprehension for complex multiparagraph material >90% with use of compensations allowed (text-to-speech).    Time 12    Period Weeks    Status New    Target Date 03/26/21      SLP LONG TERM GOAL #4  Title Pt will generate typed or written responses to workplace scenarios (emails, policies, etc) >49% accuracy within a reasonable amount of time, with use of compensations/accessibility features.    Time 12    Period Weeks    Status New    Target Date 03/26/21      SLP LONG TERM GOAL #5   Title Pt will report fewer communication breakdowns than prior to ST    Baseline ~15 per day    Time 12    Period Weeks    Status New    Target Date 03/26/21              Plan - 01/11/21 1236     Clinical Impression Statement Karl Morgan presents with mild, high-level fluent aphasia with expressive > receptive deficits. 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 Question mild auditory comprehension/processing impairments vs hearing loss; pt has been referred for ENT assessment by neurologist. Speech is fluent, with notable paraphasias but more often hesitations and restarts/rephrasing due to wordfinding difficulty. Pt reports written communication is frustrating for him. Goals/plan of care remains appropriate despite recent hospitalization. 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 He appears highly motivated for improvements. I recommend skilled ST to improve language for verbal/written communication, and auditory/reading comprehension, to meet demands of workplace and social/community activities.    Speech Therapy Frequency --   1-2 times per week   Duration 12 weeks    Treatment/Interventions Language facilitation;Environmental controls;Cueing hierarchy;SLP  instruction and feedback;Compensatory techniques;Functional tasks;Compensatory strategies;Internal/external aids;Multimodal communcation approach;Patient/family education    Potential to Achieve Goals Good    SLP Home Exercise Plan provided    Consulted and Agree with Plan of Care Patient             Patient will benefit from skilled therapeutic intervention in order to improve the following deficits and impairments:   Aphasia    Problem List Patient Active Problem List   Diagnosis Date Noted   ICH (intracerebral hemorrhage) ([place]) 12/31/2020   Thrombocytopenia ([place]) 12/23/2020   Left ventricular apical thrombus    Hypertension associated with diabetes ([place]) 11/08/2020   Elevated TSH 05/10/2020   Gallbladder polyp 11/12/2019   Fatty liver 11/12/2019   Bilateral carotid artery stenosis 08/06/2019   Mitral valve insufficiency 08/06/2019   Cerebrovascular accident (CVA) due to embolism of cerebral artery ([place]) 08/06/2019   Intracranial atherosclerosis 08/06/2019   CKD (chronic kidney disease), stage IIIa 08/03/2019   Arthritis 10/21/2018   Obesity (BMI 30-39.9) 08/22/2017   Herpes simplex type 1 infection 02/21/2015   Recurrent oral herpes simplex infection 02/21/2015   History of stroke 02/16/2015   Arteriosclerosis of coronary artery 10/07/2014   Hypotestosteronism 10/07/2014   B12 deficiency 12/18/2011   Hypothyroidism 05/04/2011   Annual physical exam 04/05/2011   Diabetes mellitus type 2, controlled, with complications ([place]) 67/59/1638   Essential hypertension 04/04/2011   Hyperlipidemia 04/04/2011   Hypogonadism male 04/04/2011   Alopecia 04/04/2011   Erectile dysfunction 04/04/2011   Deneise Lever, [place], CCC-SLP Speech-Language Pathologist  Aliene Altes 01/11/2021, 12:38 PM  [place] [address] [place], [place], [address] Phone: [phone]   Fax:  [phone]   Name: Karl Morgan MRN:  [id number] Date of Birth: June 03, 1960

## 2021-01-11 NOTE — Patient Instructions (Signed)
You can ask IT if there is any Speech-to-text software that you can use for dictating your emails. Hotel manager is what physicians use, but I don't think it can be used on email).

## 2021-01-12 ENCOUNTER — Other Ambulatory Visit: Payer: Self-pay | Admitting: *Deleted

## 2021-01-12 NOTE — Patient Outreach (Addendum)
[place] [facility]) Care Management  01/12/2021  Karl Morgan Jun 10, 1960 [id number]   Transition of care follow up call/case closure   Transition of care call   Referral received:01/02/21 Initial outreach:01/04/21 Insurance: [facility]   Monthly telephonic UMR case management review with [facility] RNCMs and Dr Alonza Bogus. Update provided to include:  Medical Conditions Update: [name]  was hospitalized at [facility] Comorbid[facility] 10/10- 01/01/21  for  new small acute hemorrhage in the right frontal and temporal lobes in the region of recent R MCA territory stroke.  Hemorrhagic transformation due to supratherapeutic INR, PMHx includes :Hypertension,  DM2, Hyperlipidemia, Apical thrombus, Acute Right MCA stroke s/p thrombectomy.  He was discharged to home on 01/01/21 without the need for home health services or DME.  Follow up Discussed plan for follow up TOC call on today and reinforcment of Stroke management, signs symptoms and action plan.      Subjective Successful follow up call to patient he reports feeling much better over the last 2 days, he is working half days.  He discussed recent UTI and completing antibiotic no new symptoms. He discussed recent follow up with neurology, continues to take eliquis, reviewed signs symptoms of bleeding and stroke to seek immediate medical attention, he states that is if fully aware to that.  Patient denies any new questions, education or resources needs.   Plan Will plan [facility] care management case closure no ongoing care management needs identified.    Joylene Draft, RN, BSN  [place] Management Coordinator  [phone]- Mobile [phone]- Toll Free Main Office

## 2021-01-13 ENCOUNTER — Other Ambulatory Visit: Payer: Self-pay | Admitting: *Deleted

## 2021-01-13 NOTE — Patient Outreach (Signed)
[facility]) Care Management  01/13/2021  Karl Morgan 20-Jan-1961 [id number]  Multidisciplinary case discussion    Patient case was discussed at multidisciplinary case discussion   Update provided to include:   Medical Conditions Update [name]  was hospitalized at [facility] 10/10- 01/01/21  for  new small acute hemorrhage in the right frontal and temporal lobes in the region of recent R MCA territory stroke.  Hemorrhagic transformation due to supratherapeutic INR, PMHx includes :Hypertension,  DM2, Hyperlipidemia, Apical thrombus, Acute Right MCA stroke s/p thrombectomy.  He was discharged to home on 01/01/21 without the need for home health services or DME.  Plan/Follow up  No ongoing care management is needs identified, patient has been education on stroke symptoms and action plan, signs symptoms of bleeding.   Joylene Draft, RN, BSN  [place] Management Coordinator  [phone]- Mobile [phone]- Toll Free Main Office

## 2021-01-16 ENCOUNTER — Ambulatory Visit (INDEPENDENT_AMBULATORY_CARE_PROVIDER_SITE_OTHER): Payer: 59

## 2021-01-16 DIAGNOSIS — I6340 Cerebral infarction due to embolism of unspecified cerebral artery: Secondary | ICD-10-CM

## 2021-01-18 ENCOUNTER — Ambulatory Visit: Payer: 59 | Admitting: Speech Pathology

## 2021-01-18 ENCOUNTER — Inpatient Hospital Stay (HOSPITAL_BASED_OUTPATIENT_CLINIC_OR_DEPARTMENT_OTHER): Payer: 59 | Admitting: Oncology

## 2021-01-18 ENCOUNTER — Other Ambulatory Visit: Payer: Self-pay

## 2021-01-18 ENCOUNTER — Encounter: Payer: Self-pay | Admitting: Oncology

## 2021-01-18 VITALS — BP 132/93 | HR 64 | Temp 97.6°F | Resp 18 | Wt 237.7 lb

## 2021-01-18 DIAGNOSIS — D72829 Elevated white blood cell count, unspecified: Secondary | ICD-10-CM | POA: Insufficient documentation

## 2021-01-18 DIAGNOSIS — Z23 Encounter for immunization: Secondary | ICD-10-CM | POA: Insufficient documentation

## 2021-01-18 DIAGNOSIS — I2119 ST elevation (STEMI) myocardial infarction involving other coronary artery of inferior wall: Secondary | ICD-10-CM | POA: Diagnosis not present

## 2021-01-18 DIAGNOSIS — R079 Chest pain, unspecified: Secondary | ICD-10-CM | POA: Insufficient documentation

## 2021-01-18 DIAGNOSIS — IMO0002 Reserved for concepts with insufficient information to code with codable children: Secondary | ICD-10-CM | POA: Insufficient documentation

## 2021-01-18 DIAGNOSIS — R4701 Aphasia: Secondary | ICD-10-CM

## 2021-01-18 DIAGNOSIS — I6340 Cerebral infarction due to embolism of unspecified cerebral artery: Secondary | ICD-10-CM

## 2021-01-18 DIAGNOSIS — I513 Intracardiac thrombosis, not elsewhere classified: Secondary | ICD-10-CM

## 2021-01-18 LAB — CUP PACEART REMOTE DEVICE CHECK
Date Time Interrogation Session: 20221019142941
Implantable Pulse Generator Implant Date: 20210503

## 2021-01-18 NOTE — Therapy (Signed)
[place] MAIN [facility] SERVICES [address] [place], [place], [address] Phone: [phone]   Fax:  [phone]  Speech Language Pathology Treatment  Patient Details  Name: Karl Morgan MRN: [id number] Date of Birth: [birth year] Referring Provider (SLP): Rosalin Hawking, MD   Encounter Date: 01/18/2021   End of Session - 01/18/21 1257     Visit Number 3    Number of Visits 25    Date for SLP Re-Evaluation 03/26/21    Authorization - Visit Number 3    Progress Note Due on Visit 10    SLP Start Time 1000    SLP Stop Time  1100    SLP Time Calculation (min) 60 min    Activity Tolerance Patient tolerated treatment well             Past Medical History:  Diagnosis Date   Alopecia 04/04/2011   Diabetes mellitus    Erectile dysfunction 04/04/2011   Heart attack ([place]) 10/07/2014   Overview:  Status post STEMI, anterior myocardial infarction status post DES stent x 2 to the LAD, mid and distal.    Heart murmur    Hyperlipidemia 04/04/2011   Hypogonadism male 04/04/2011   Myocardial infarction [facility])    Stroke [facility])    Substance abuse ([place])    Type II or unspecified type diabetes mellitus without mention of complication, uncontrolled 04/04/2011   Unspecified essential hypertension 04/04/2011    Past Surgical History:  Procedure Laterality Date   BUBBLE STUDY  08/03/2019   Procedure: BUBBLE STUDY;  Surgeon: Fay Records, MD;  Location: [facility] ENDOSCOPY;  Service: Cardiovascular;;   CARDIAC CATHETERIZATION     IR ANGIO INTRA EXTRACRAN SEL COM CAROTID INNOMINATE UNI L MOD SED  12/17/2020   IR CT HEAD LTD  12/17/2020   IR PERCUTANEOUS ART THROMBECTOMY/INFUSION INTRACRANIAL INC DIAG ANGIO  12/17/2020   IR US GUIDE VASC ACCESS RIGHT  12/17/2020   LOOP RECORDER INSERTION N/A 08/03/2019   Procedure: LOOP RECORDER INSERTION;  Surgeon: Evans Lance, MD;  Location: [place] CV LAB;  Service: Cardiovascular;  Laterality: N/A;   RADIOLOGY WITH ANESTHESIA N/A  12/17/2020   Procedure: IR WITH ANESTHESIA;  Surgeon: Luanne Bras, MD;  Location: [place];  Service: Radiology;  Laterality: N/A;   TEE WITHOUT CARDIOVERSION N/A 08/03/2019   Procedure: TRANSESOPHAGEAL ECHOCARDIOGRAM (TEE);  Surgeon: Fay Records, MD;  Location: [place];  Service: Cardiovascular;  Laterality: N/A;    There were no vitals filed for this visit.   Subjective Assessment - 01/18/21 1252     Subjective "I felt like I stumbled through it," (meeting agenda)    Currently in Pain? No/denies                   ADULT SLP TREATMENT - 01/18/21 1253       General Information   Behavior/Cognition Alert;Pleasant mood;Cooperative    HPI Patient is a 60 year old male admitted at [facility] 12/17/20 s/p R MCA CVA due to M1 occlusion, s/p IR with revascularization, embolic pattern, secondary to LV thrombus. PMH DM, HTN. 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 Evaluated by SLP during acute stay and found to have mild, high-level fluent aphasia. Readmitted 12/30/20 to 01/01/21 and found to have new small acute hemorrhages in right frontal and temporal lobes, LV thrombus.      Cognitive-Linquistic Treatment   Treatment focused on Aphasia;Patient/family/caregiver education    Skilled Treatment Pt reports verbal communication continues to improve;  noticed he is better when communicating one-on-one. Discussed his meeting agenda and SLP provided feedback, strategies such as preplanning, scripting, or writing keywords to reduce wordfinding difficulties during group discussion. Demonstrated use of speech-to-text on computer for writing emails, and text-to-speech for proofreading in [name]. Used VNEST for sentence generation; pt reviewed for errors and made corrections, min-mod cues necessary for 3 errors in 4 sentences (8-12 words). Cues for slower rate; pt independently saying sentence aloud while writing as instructed during previous session. Facilitated mod-complex conversation (small towns in  [place], [place]), with use of map as visual aid. Pt had minimal verbal error x1 which he self-corrected.      Assessment / Recommendations / Plan   Plan Continue with current plan of care      Progression Toward Goals   Progression toward goals Progressing toward goals              SLP Education - 01/18/21 1257     Education Details preplan agendas    Person(s) Educated Patient    Methods Explanation;Handout    Comprehension Verbalized understanding;Need further instruction              SLP Short Term Goals - 12/27/20 1322       SLP SHORT TERM GOAL #1   Title Patient will use compensations for aphasia in 10 minute mod-complex conversation with rare min cues.    Time 10    Period --   sessions   Status New      SLP SHORT TERM GOAL #2   Title Patient will ID verbal errors and attempt correction >90% of the time.    Time 10    Period --   sessions   Status New      SLP SHORT TERM GOAL #3   Title Pt will request repeats of verbal information when auditory breakdowns occur, >90% of the time.    Time 10    Period --   sessions   Status New      SLP SHORT TERM GOAL #4   Title Pt will complete further assessment of reading comprehension.    Time 10    Period --   sessions   Status New              SLP Long Term Goals - 12/27/20 1331       SLP LONG TERM GOAL #1   Title Pt will use compensations for aphasia in 20 minute complex conversation, repairing breakdowns as necessary with minimal listener cues (facial expression).    Time 12    Period Weeks    Status New    Target Date 03/26/21      SLP LONG TERM GOAL #2   Title Pt will recall >90% of key details from 20 minute presentation, using strategies/accessibility features for comprehension (note-taking, speech-to-text).    Time 12    Period Weeks    Status New    Target Date 03/26/21      SLP LONG TERM GOAL #3   Title Pt will demonstrate reading comprehension for complex multiparagraph material >90%  with use of compensations allowed (text-to-speech).    Time 12    Period Weeks    Status New    Target Date 03/26/21      SLP LONG TERM GOAL #4   Title Pt will generate typed or written responses to workplace scenarios (emails, policies, etc) >41% accuracy within a reasonable amount of time, with use of compensations/accessibility features.  Time 12    Period Weeks    Status New    Target Date 03/26/21      SLP LONG TERM GOAL #5   Title Pt will report fewer communication breakdowns than prior to ST    Baseline ~15 per day    Time 12    Period Weeks    Status New    Target Date 03/26/21              Plan - 01/18/21 1258     Clinical Impression Statement [name] presents with mild, high-level fluent aphasia with expressive > receptive deficits. 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 Auditory comprehension appears functional during mod complex conversation today; pt with minimal hesitations/substitutions in conversational speech. Reports more difficulty communicating under time pressure or in group settings. 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 He appears highly motivated for improvements. I recommend skilled ST to improve language for verbal/written communication, and auditory/reading comprehension, to meet demands of workplace and social/community activities.    Speech Therapy Frequency --   1-2 times per week   Duration 12 weeks    Treatment/Interventions Language facilitation;Environmental controls;Cueing hierarchy;SLP instruction and feedback;Compensatory techniques;Functional tasks;Compensatory strategies;Internal/external aids;Multimodal communcation approach;Patient/family education    Potential to Achieve Goals Good    SLP Home Exercise Plan provided    Consulted and Agree with Plan of Care Patient             Patient will benefit from skilled therapeutic intervention in order to  improve the following deficits and impairments:   Aphasia    Problem List Patient Active Problem List   Diagnosis Date Noted   Need for vaccination 01/18/2021   Elevation of level of transaminase or lactic acid dehydrogenase (LDH) 01/18/2021   Elevated WBC count 01/18/2021   Chest pain 01/18/2021   Acute inferior myocardial infarction ([place]) 01/18/2021   ICH (intracerebral hemorrhage) ([place]) 12/31/2020   Thrombocytopenia ([place]) 12/23/2020   Left ventricular apical thrombus    Hypertension associated with diabetes ([place]) 11/08/2020   Elevated TSH 05/10/2020   Gallbladder polyp 11/12/2019   Fatty liver 11/12/2019   Bilateral carotid artery stenosis 08/06/2019   Mitral valve insufficiency 08/06/2019   Cerebrovascular accident (CVA) due to embolism of cerebral artery ([place]) 08/06/2019   Intracranial atherosclerosis 08/06/2019   CKD (chronic kidney disease), stage IIIa 08/03/2019   Arthritis 10/21/2018   Obesity (BMI 30-39.9) 08/22/2017   Herpes simplex type 1 infection 02/21/2015   Recurrent oral herpes simplex infection 02/21/2015   History of stroke 02/16/2015   Arteriosclerosis of coronary artery 10/07/2014   Hypotestosteronism 10/07/2014   B12 deficiency 12/18/2011   Hypothyroidism 05/04/2011   Annual physical exam 04/05/2011   Diabetes mellitus type 2, controlled, with complications ([place]) 95/28/4132   Essential hypertension 04/04/2011   Hyperlipidemia 04/04/2011   Hypogonadism male 04/04/2011   Alopecia 04/04/2011   Erectile dysfunction 04/04/2011   Deneise Lever, [place], CCC-SLP Speech-Language Pathologist  Aliene Altes 01/18/2021, 1:00 PM  [place] MAIN [facility] SERVICES [address] [place], [place], [address] Phone: [phone]   Fax:  [phone]   Name: Caylon F San Morgan MRN: [id number] Date of Birth: [birth year]

## 2021-01-18 NOTE — Progress Notes (Signed)
Hematology/Oncology Consult note [facility]  [phone] [phone]  Patient Care Team: McLean-Scocuzza, Nino Glow, MD as PCP - General (Internal Medicine)   Name of the patient: Karl Morgan  [id number]  [birth year]   Date of visit: 01/18/21  Diagnosis-history of recurrent strokes  Chief complaint/ Reason for visit-discuss hypercoagulable work-up and further management  Heme/Onc history: Patient is a 60 year old male with a past medical history significant for hypertension diabetes hyperlipidemia and a prior history of stroke who was found to have a right MCA infarct on 12/20/2020 for which she underwent revascularization procedure.  Echocardiogram done at that time showed a nonmobile LV mural thrombus 1.2 x 0.5 cm in size in the region of akinesis involving the apical septal wall.  He was initially started on Lovenox and then bridged to Coumadin and discharged.  10 days later patient was admitted for supratherapeutic INR and had a repeat CT head which showed small acute hemorrhagic conversion within the right frontal and temporal lobes.  Coumadin was held with plans to restart anticoagulation soon in the near future after he has a repeat CT and cleared by neurology to restart anticoagulation.   Patient states that this is his second stroke and when he had his first stroke he did not have any evidence ofLV thrombus.  Patient being followed by cardiology and neurology.  He is currently on Eliquis  Results of hypercoagulable work-up did not show any evidence of factor V Leiden and prothrombin gene mutation.  Protein C, protein S, Antithrombin III levels normal.  Antiphospholipid antibody panel unremarkable.  Autoimmune panel and CRP negative.  Interval history-patient continues to have intermittent motor aphasia due to recent stroke but continues to function independently without any significant residual neurological deficits.  He is currently on  Eliquis  ECOG PS- 1 Pain scale- 0   Review of systems- Review of Systems  Constitutional:  Negative for chills, fever, malaise/fatigue and weight loss.  HENT:  Negative for congestion, ear discharge and nosebleeds.   Eyes:  Negative for blurred vision.  Respiratory:  Negative for cough, hemoptysis, sputum production, shortness of breath and wheezing.   Cardiovascular:  Negative for chest pain, palpitations, orthopnea and claudication.  Gastrointestinal:  Negative for abdominal pain, blood in stool, constipation, diarrhea, heartburn, melena, nausea and vomiting.  Genitourinary:  Negative for dysuria, flank pain, frequency, hematuria and urgency.  Musculoskeletal:  Negative for back pain, joint pain and myalgias.  Skin:  Negative for rash.  Neurological:  Positive for speech change. Negative for dizziness, tingling, focal weakness, seizures, weakness and headaches.  Endo/Heme/Allergies:  Does not bruise/bleed easily.  Psychiatric/Behavioral:  Negative for depression and suicidal ideas. The patient does not have insomnia.      Allergies  Allergen Reactions   Other Other (See Comments)    Rybelsus ab pain at 3 mg, 7 mg n/v   Pravachol [Pravastatin Sodium] Other (See Comments)    Muscle aches     Past Medical History:  Diagnosis Date   Alopecia 04/04/2011   Diabetes mellitus    Erectile dysfunction 04/04/2011   Heart attack ([place]) 10/07/2014   Overview:  Status post STEMI, anterior myocardial infarction status post DES stent x 2 to the LAD, mid and distal.    Heart murmur    Hyperlipidemia 04/04/2011   Hypogonadism male 04/04/2011   Myocardial infarction [facility])    Stroke ([place])    Substance abuse ([place])    Type II or unspecified type diabetes mellitus without mention  of complication, uncontrolled 04/04/2011   Unspecified essential hypertension 04/04/2011     Past Surgical History:  Procedure Laterality Date   BUBBLE STUDY  08/03/2019   Procedure: BUBBLE STUDY;  Surgeon: Fay Records, MD;   Location: [facility] ENDOSCOPY;  Service: Cardiovascular;;   CARDIAC CATHETERIZATION     IR ANGIO INTRA EXTRACRAN SEL COM CAROTID INNOMINATE UNI L MOD SED  12/17/2020   IR CT HEAD LTD  12/17/2020   IR PERCUTANEOUS ART THROMBECTOMY/INFUSION INTRACRANIAL INC DIAG ANGIO  12/17/2020   IR US GUIDE VASC ACCESS RIGHT  12/17/2020   LOOP RECORDER INSERTION N/A 08/03/2019   Procedure: LOOP RECORDER INSERTION;  Surgeon: Evans Lance, MD;  Location: [place] CV LAB;  Service: Cardiovascular;  Laterality: N/A;   RADIOLOGY WITH ANESTHESIA N/A 12/17/2020   Procedure: IR WITH ANESTHESIA;  Surgeon: Luanne Bras, MD;  Location: [place];  Service: Radiology;  Laterality: N/A;   TEE WITHOUT CARDIOVERSION N/A 08/03/2019   Procedure: TRANSESOPHAGEAL ECHOCARDIOGRAM (TEE);  Surgeon: Fay Records, MD;  Location: [facility] ENDOSCOPY;  Service: Cardiovascular;  Laterality: N/A;    Social History   Socioeconomic History   Marital status: Married    Spouse name: Not on file   Number of children: Not on file   Years of education: Not on file   Highest education level: Not on file  Occupational History   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  Tobacco Use   Smoking status: Never   Smokeless tobacco: Never  Vaping Use   Vaping Use: Never used  Substance and Sexual Activity   Alcohol use: No    Comment: former etoh abuse in 1980s    Drug use: No   Sexual activity: Yes  Other Topics Concern   Not on file  Social History Narrative   Caffienated drinks-no   Seat belt use often-yes   Regular Exercise-no   Smoke alarm in the home-yes   Firearms/guns in the home-yes   History of physical abuse-no      Married 2 kids daughter and son    Oldest of siblings has 2 brothers and 1 sister    Never smoker    Works Physicist, medical and [employer]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       DPR wife Vickie             Social Determinants of Adult nurse Strain: Not on Art therapist Insecurity: Not on file  Transportation Needs: Not on file  Physical Activity: Not on file  Stress: Not on file  Social Connections: Not on file  Intimate Partner Violence: Not on file    Family History  Problem Relation Age of Onset   Hypertension Father    Dementia Father        died age 69 in 19-Jun-2017   Memory loss Father    Emphysema Mother    COPD Mother    Thyroid disease Mother    Cancer Neg Hx    Diabetes Neg Hx    Heart disease Neg Hx    Stroke Neg Hx      Current Outpatient Medications:    apixaban (ELIQUIS) 5 MG TABS tablet, Take 1 tablet (5 mg total) by mouth every 12 (twelve) hours, Disp: 90 tablet, Rfl: 7   atorvastatin (LIPITOR) 80 MG tablet, Take 1 tablet (80 mg total) by mouth daily., Disp: 90 tablet, Rfl: 0  dapagliflozin propanediol (FARXIGA) 10 MG TABS tablet, Take 1 tablet (10 mg total) by mouth daily., Disp: 30 tablet, Rfl: 0   ezetimibe (ZETIA) 10 MG tablet, Take 1 tablet (10 mg total) by mouth daily., Disp: 90 tablet, Rfl: 0   glimepiride (AMARYL) 2 MG tablet, TAKE 1 TABLET (2 MG TOTAL) BY MOUTH 2 (TWO) TIMES DAILY WITH A MEAL. (Patient taking differently: Take 2 mg by mouth daily with breakfast.), Disp: 180 tablet, Rfl: 3   levothyroxine (SYNTHROID) 75 MCG tablet, Take 1 tablet (75 mcg total) by mouth daily., Disp: 90 tablet, Rfl: 3   lisinopril (ZESTRIL) 40 MG tablet, TAKE 1 TABLET (40 MG TOTAL) BY MOUTH DAILY., Disp: 90 tablet, Rfl: 3   metFORMIN (GLUCOPHAGE) 850 MG tablet, TAKE 1 TABLET BY MOUTH TWICE DAILY, Disp: 180 tablet, Rfl: 3   metoprolol succinate (TOPROL-XL) 50 MG 24 hr tablet, TAKE 1 TABLET BY MOUTH DAILY. TAKE WITH OR IMMEDIATELY FOLLOWING A MEAL., Disp: 90 tablet, Rfl: 3   pantoprazole (PROTONIX) 40 MG tablet, Take 1 tablet (40 mg total) by mouth daily., Disp: 90 tablet, Rfl: 0   sitaGLIPtin (JANUVIA) 100 MG tablet, TAKE 1 TABLET (100 MG TOTAL) BY MOUTH DAILY., Disp: 90 tablet, Rfl: 3    valACYclovir (VALTREX) 1000 MG tablet, take 1 to 2 tablets by mouth every 12 hours for 2 to 3 days as needed for fever blisters (Patient not taking: Reported on 01/18/2021), Disp: 90 tablet, Rfl: 3  Physical exam:  Vitals:   01/18/21 0840  BP: (!) 132/93  Pulse: 64  Resp: 18  Temp: 97.6 F (36.4 C)  SpO2: 99%  Weight: 237 lb 11.2 oz (107.8 kg)   Physical Exam Constitutional:      General: He is not in acute distress. Cardiovascular:     Rate and Rhythm: Normal rate and regular rhythm.  Pulmonary:     Effort: Pulmonary effort is normal.  Skin:    General: Skin is warm and dry.  Neurological:     Mental Status: He is alert and oriented to person, place, and time.     Comments: Occasional word finding difficulty     CMP Latest Ref Rng & Units 01/01/2021  Glucose 70 - 99 mg/dL 124(H)  BUN 8 - 23 mg/dL 19  Creatinine 0.61 - 1.24 mg/dL 1.19  Sodium 135 - 145 mmol/L 138  Potassium 3.5 - 5.1 mmol/L 4.0  Chloride 98 - 111 mmol/L 106  CO2 22 - 32 mmol/L 23  Calcium 8.9 - 10.3 mg/dL 8.9  Total Protein 6.5 - 8.1 g/dL -  Total Bilirubin 0.3 - 1.2 mg/dL -  Alkaline Phos 38 - 126 U/L -  AST 15 - 41 U/L -  ALT 0 - 44 U/L -   CBC Latest Ref Rng & Units 01/01/2021  WBC 4.0 - 10.5 K/uL 9.7  Hemoglobin 13.0 - 17.0 g/dL 13.5  Hematocrit 39.0 - 52.0 % 40.1  Platelets 150 - 400 K/uL 250    No images are attached to the encounter.  CT HEAD WO CONTRAST (5MM)  Result Date: 01/06/2021 CLINICAL DATA:  Stroke.  Follow-up. EXAM: CT HEAD WITHOUT CONTRAST TECHNIQUE: Contiguous axial images were obtained from the base of the skull through the vertex without intravenous contrast. COMPARISON:  CT head without contrast 12/30/2020 FINDINGS: Brain: There is continued decreased conspicuity of hyperdense regions within the right hemisphere focal cortical area in the right frontal operculum is decreased in size. Blood in the posterior right sylvian fissure is decreased. No new  areas of hemorrhage are  present. A remote infarct is present in the high right frontal lobe. Expected evolution of infarct involving the more inferior right frontal lobe and a right insular cortex. No significant extension of the infarct is present. No mass effect is present. The ventricles are of normal size. No significant extraaxial fluid collection is present. A remote nonhemorrhagic infarct in the right cerebellum is again noted. Vascular: Atherosclerotic calcifications are present within the cavernous internal carotid arteries. No hyperdense vessel is present. Skull: Calvarium is intact. No focal lytic or blastic lesions are present. Sinuses/Orbits: A polyp or mucous retention cyst is present the right maxillary sinus. The paranasal sinuses and mastoid air cells are otherwise clear. The globes and orbits are within normal limits. IMPRESSION: 1. Continued decreased conspicuity of hyperdense regions within the right hemisphere compatible with resolving areas of hemorrhage. 2. Expected evolution of infarct involving the more inferior right frontal lobe, right insular cortex and right temporal lobe. 3. No significant extension of the infarct. 4. Remote infarct in the high right frontal lobe. 5. Remote nonhemorrhagic infarct of the right cerebellum. 6. Atherosclerosis. Electronically Signed   By: San Morelle M.D.   On: 01/06/2021 15:30   CT Head Wo Contrast  Result Date: 12/30/2020 CLINICAL DATA:  Stroke follow-up EXAM: CT HEAD WITHOUT CONTRAST TECHNIQUE: Contiguous axial images were obtained from the base of the skull through the vertex without intravenous contrast. COMPARISON:  Head CT 12/30/2020 FINDINGS: Brain: Decreased conspicuity recently identified areas small volume hemorrhage in the right hemisphere. Hypodensity of the inferior right frontal lobe is unchanged. Unchanged appearance of old bilateral cerebellar infarcts. No midline shift or other mass effect. Vascular: No abnormal hyperdensity of the major intracranial  arteries or dural venous sinuses. No intracranial atherosclerosis. Skull: The visualized skull base, calvarium and extracranial soft tissues are normal. Sinuses/Orbits: No fluid levels or advanced mucosal thickening of the visualized paranasal sinuses. No mastoid or middle ear effusion. The orbits are normal. IMPRESSION: 1. Decreased conspicuity of recently identified areas of small volume hemorrhage in the right hemisphere. 2. Unchanged appearance of inferior right frontal lobe infarct. 3. Unchanged appearance of old bilateral cerebellar infarcts. Electronically Signed   By: Ulyses Jarred M.D.   On: 12/30/2020 20:04   CT HEAD WO CONTRAST (5MM)  Result Date: 12/30/2020 CLINICAL DATA:  Headache, uncomplicated (Ped [id number]) Eval for bleeding, mild headache 4 days, INR > 10 with recent stroke. EXAM: CT HEAD WITHOUT CONTRAST TECHNIQUE: Contiguous axial images were obtained from the base of the skull through the vertex without intravenous contrast. COMPARISON:  12/17/2020. FINDINGS: Brain: Evolving infarcts within the right MCA territory. Multiple small acute hemorrhages in the region of evolving infarct in the right frontal and temporal lobes. For example, small hemorrhage in the right frontal lobe (series 3, image 19) and small hemorrhage in the superior right temporal lobe (series 5, image 21). Additional faint acute hemorrhage in the inferior right frontal lobe (series 3, image 14). Edema in the inferior right frontal lobe is increased without midline shift. Remote right frontal and bilateral cerebellar infarcts. No hydrocephalus. No mass lesion. Vascular: Hyperdense vessel identified. Calcific intracranial atherosclerosis. Skull: No acute fracture. Sinuses/Orbits: Partially imaged right maxillary sinus retention cyst. Unremarkable orbits. Other: No mastoid effusions. IMPRESSION: 1. Multiple new small acute intraparenchymal hemorrhages within the right frontal and temporal lobes in the region of the evolving right  MCA territory infarct, detailed above. 2. Edema in the inferior right frontal lobe is increased without midline shift. 3. Chronic  right frontal and bilateral cerebellar infarcts. Findings discussed with Dr. Quinn Axe via telephone at 1:57 PM. Also discussed with Dr. Jacqualine Code at 2:00 PM. Electronically Signed   By: Margaretha Sheffield M.D.   On: 12/30/2020 14:10     Assessment and plan- Patient is a 60 y.o. male with left ventricular thrombus and recent CVA referred for hypercoagulable work-up  Again explained to the patient the differences between venous thrombi and arterial thromboembolic events such as strokes.  Thrombophilias are typically associated with venous thrombosis rather than arterial thromboembolic events.  Patient's hypercoagulable work-up including factor V Leiden, prothrombin gene mutation, protein C protein S Antithrombin III levels as well as antiphospholipid antibody panel testing was negative.  Vasculitis panel negative.  Suspect his present stroke was secondary due to his left ventricular thrombus which developed in an area of hypokinetic left heart.  Further anticoagulation management as per cardiology and neurology.  Patient does not require any follow-up with hematology at this time   Visit Diagnosis 1. Left ventricular apical thrombus   2. Acute inferior myocardial infarction ([place])   3. Cerebrovascular accident (CVA) due to embolism of cerebral artery ([place])      Dr. Randa Evens, MD, MPH [facility] at [facility] [phone] 01/18/2021 10:11 AM

## 2021-01-18 NOTE — Patient Instructions (Addendum)
Try preplanning your agendas for when you speak in front of a group. Write down some details or key words so that you have a "jumping off point."   It's very normal to have more difficulty with wordfinding when you are under time pressure or in front of a group.   Try using  + h to help you dictate your emails. You can also use the "review" feature in Windows to read your composition aloud and check for errors.   When you make a mistake, other people might be tempted to "bail you out." Let them know that you want a chance to correct it, because this helps your brain's recovery.

## 2021-01-19 DIAGNOSIS — I152 Hypertension secondary to endocrine disorders: Secondary | ICD-10-CM | POA: Diagnosis not present

## 2021-01-19 DIAGNOSIS — E1169 Type 2 diabetes mellitus with other specified complication: Secondary | ICD-10-CM | POA: Diagnosis not present

## 2021-01-19 DIAGNOSIS — E1159 Type 2 diabetes mellitus with other circulatory complications: Secondary | ICD-10-CM | POA: Diagnosis not present

## 2021-01-19 DIAGNOSIS — E119 Type 2 diabetes mellitus without complications: Secondary | ICD-10-CM | POA: Diagnosis not present

## 2021-01-23 DIAGNOSIS — E119 Type 2 diabetes mellitus without complications: Secondary | ICD-10-CM | POA: Diagnosis not present

## 2021-01-23 LAB — HM DIABETES EYE EXAM

## 2021-01-24 NOTE — Progress Notes (Signed)
Carelink Summary Report / Loop Recorder 

## 2021-01-25 ENCOUNTER — Ambulatory Visit: Payer: 59 | Admitting: Speech Pathology

## 2021-01-25 ENCOUNTER — Other Ambulatory Visit: Payer: Self-pay

## 2021-01-25 DIAGNOSIS — R4701 Aphasia: Secondary | ICD-10-CM | POA: Diagnosis not present

## 2021-01-25 NOTE — Patient Instructions (Signed)
Homework: Write down a list of your key points for discussion/ objectives you want to address with your contractor. Use this as a reference/reminder when you are in your meeting.

## 2021-01-25 NOTE — Therapy (Signed)
[place] MAIN [facility] SERVICES [address] [place], [place], [address] Phone: [phone]   Fax:  [phone]  Speech Language Pathology Treatment  Patient Details  Name: Karl Morgan MRN: [id number] Date of Birth: 07-Jul-1960 Referring Provider (SLP): Rosalin Hawking, MD   Encounter Date: 01/25/2021   End of Session - 01/25/21 1235     Visit Number 4    Number of Visits 25    Date for SLP Re-Evaluation 03/26/21    Authorization - Visit Number 4    Progress Note Due on Visit 10    SLP Start Time 1004    SLP Stop Time  1100    SLP Time Calculation (min) 56 min    Activity Tolerance Patient tolerated treatment well             Past Medical History:  Diagnosis Date   Alopecia 04/04/2011   Diabetes mellitus    Erectile dysfunction 04/04/2011   Heart attack ([place]) 10/07/2014   Overview:  Status post STEMI, anterior myocardial infarction status post DES stent x 2 to the LAD, mid and distal.    Heart murmur    Hyperlipidemia 04/04/2011   Hypogonadism male 04/04/2011   Myocardial infarction [facility])    Stroke [facility])    Substance abuse ([place])    Type II or unspecified type diabetes mellitus without mention of complication, uncontrolled 04/04/2011   Unspecified essential hypertension 04/04/2011    Past Surgical History:  Procedure Laterality Date   BUBBLE STUDY  08/03/2019   Procedure: BUBBLE STUDY;  Surgeon: Fay Records, MD;  Location: [facility] ENDOSCOPY;  Service: Cardiovascular;;   CARDIAC CATHETERIZATION     IR ANGIO INTRA EXTRACRAN SEL COM CAROTID INNOMINATE UNI L MOD SED  12/17/2020   IR CT HEAD LTD  12/17/2020   IR PERCUTANEOUS ART THROMBECTOMY/INFUSION INTRACRANIAL INC DIAG ANGIO  12/17/2020   IR US GUIDE VASC ACCESS RIGHT  12/17/2020   LOOP RECORDER INSERTION N/A 08/03/2019   Procedure: LOOP RECORDER INSERTION;  Surgeon: Evans Lance, MD;  Location: [place] CV LAB;  Service: Cardiovascular;  Laterality: N/A;   RADIOLOGY WITH ANESTHESIA N/A  12/17/2020   Procedure: IR WITH ANESTHESIA;  Surgeon: Luanne Bras, MD;  Location: [place];  Service: Radiology;  Laterality: N/A;   TEE WITHOUT CARDIOVERSION N/A 08/03/2019   Procedure: TRANSESOPHAGEAL ECHOCARDIOGRAM (TEE);  Surgeon: Fay Records, MD;  Location: [place];  Service: Cardiovascular;  Laterality: N/A;    There were no vitals filed for this visit.          ADULT SLP TREATMENT - 01/25/21 1234       General Information   Behavior/Cognition Alert;Pleasant mood;Cooperative    HPI Patient is a 60 year old male admitted at [facility] 12/17/20 s/p R MCA CVA due to M1 occlusion, s/p IR with revascularization, embolic pattern, secondary to LV thrombus. PMH DM, HTN. 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 Evaluated by SLP during acute stay and found to have mild, high-level fluent aphasia. Readmitted 12/30/20 to 01/01/21 and found to have new small acute hemorrhages in right frontal and temporal lobes, LV thrombus.      Cognitive-Linquistic Treatment   Treatment focused on Aphasia;Patient/family/caregiver education    Skilled Treatment Targeted mod complex conversation and error correction; pt self-corrected 95% of errors. Education, reminder x2 for slowing rate in conversation. Pt did not need any repeats for comprehension. Education on using self-advocacy to reduce time pressure in conversations. Pt reports not being able to  make as many "quips" or witty comments, which he noticed when at a social gathering with his spouse. Has upcoming meeting with a contractor and reports concern about being able to make all of his points. SLP requested for pt to generate list of key items for discussion/talking points prior to his meeting.      Assessment / Recommendations / Plan   Plan Continue with current plan of care      Progression Toward Goals   Progression toward goals Progressing toward goals                SLP Short Term Goals - 12/27/20 1322       SLP SHORT TERM GOAL #1    Title Patient will use compensations for aphasia in 10 minute mod-complex conversation with rare min cues.    Time 10    Period --   sessions   Status New      SLP SHORT TERM GOAL #2   Title Patient will ID verbal errors and attempt correction >90% of the time.    Time 10    Period --   sessions   Status New      SLP SHORT TERM GOAL #3   Title Pt will request repeats of verbal information when auditory breakdowns occur, >90% of the time.    Time 10    Period --   sessions   Status New      SLP SHORT TERM GOAL #4   Title Pt will complete further assessment of reading comprehension.    Time 10    Period --   sessions   Status New              SLP Long Term Goals - 12/27/20 1331       SLP LONG TERM GOAL #1   Title Pt will use compensations for aphasia in 20 minute complex conversation, repairing breakdowns as necessary with minimal listener cues (facial expression).    Time 12    Period Weeks    Status New    Target Date 03/26/21      SLP LONG TERM GOAL #2   Title Pt will recall >90% of key details from 20 minute presentation, using strategies/accessibility features for comprehension (note-taking, speech-to-text).    Time 12    Period Weeks    Status New    Target Date 03/26/21      SLP LONG TERM GOAL #3   Title Pt will demonstrate reading comprehension for complex multiparagraph material >90% with use of compensations allowed (text-to-speech).    Time 12    Period Weeks    Status New    Target Date 03/26/21      SLP LONG TERM GOAL #4   Title Pt will generate typed or written responses to workplace scenarios (emails, policies, etc) >77% accuracy within a reasonable amount of time, with use of compensations/accessibility features.    Time 12    Period Weeks    Status New    Target Date 03/26/21      SLP LONG TERM GOAL #5   Title Pt will report fewer communication breakdowns than prior to ST    Baseline ~15 per day    Time 12    Period Weeks    Status New     Target Date 03/26/21              Plan - 01/25/21 1234     Clinical Impression Statement [name] presents with  mild, high-level fluent aphasia with expressive > receptive deficits. 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 Auditory comprehension appears functional during mod complex conversation today; pt self-corrected 95% of errors in conversation. Tends to make more errors with rapid rate of speech. Reports more difficulty communicating under time pressure or in group settings. 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 He appears highly motivated for improvements. I recommend skilled ST to improve language for verbal/written communication, and auditory/reading comprehension, to meet demands of workplace and social/community activities.    Speech Therapy Frequency --   1-2 times per week   Duration 12 weeks    Treatment/Interventions Language facilitation;Environmental controls;Cueing hierarchy;SLP instruction and feedback;Compensatory techniques;Functional tasks;Compensatory strategies;Internal/external aids;Multimodal communcation approach;Patient/family education    Potential to Achieve Goals Good    SLP Home Exercise Plan provided    Consulted and Agree with Plan of Care Patient             Patient will benefit from skilled therapeutic intervention in order to improve the following deficits and impairments:   Aphasia    Problem List Patient Active Problem List   Diagnosis Date Noted   Need for vaccination 01/18/2021   Elevation of level of transaminase or lactic acid dehydrogenase (LDH) 01/18/2021   Elevated WBC count 01/18/2021   Chest pain 01/18/2021   Acute inferior myocardial infarction ([place]) 01/18/2021   ICH (intracerebral hemorrhage) ([place]) 12/31/2020   Thrombocytopenia ([place]) 12/23/2020   Left ventricular apical thrombus    Hypertension associated with diabetes ([place]) 11/08/2020    Elevated TSH 05/10/2020   Gallbladder polyp 11/12/2019   Fatty liver 11/12/2019   Bilateral carotid artery stenosis 08/06/2019   Mitral valve insufficiency 08/06/2019   Cerebrovascular accident (CVA) due to embolism of cerebral artery ([place]) 08/06/2019   Intracranial atherosclerosis 08/06/2019   CKD (chronic kidney disease), stage IIIa 08/03/2019   Arthritis 10/21/2018   Obesity (BMI 30-39.9) 08/22/2017   Herpes simplex type 1 infection 02/21/2015   Recurrent oral herpes simplex infection 02/21/2015   History of stroke 02/16/2015   Arteriosclerosis of coronary artery 10/07/2014   Hypotestosteronism 10/07/2014   B12 deficiency 12/18/2011   Hypothyroidism 05/04/2011   Annual physical exam 04/05/2011   Diabetes mellitus type 2, controlled, with complications ([place]) 18/56/3149   Essential hypertension 04/04/2011   Hyperlipidemia 04/04/2011   Hypogonadism male 04/04/2011   Alopecia 04/04/2011   Erectile dysfunction 04/04/2011   Deneise Lever, [place], CCC-SLP Speech-Language Pathologist  Aliene Altes 01/25/2021, 12:36 PM  [facility] [address] [place], [place], [address] Phone: [phone]   Fax:  [phone]   Name: Winfred F [place] Morgan MRN: [id number] Date of Birth: 05/20/1960

## 2021-01-27 ENCOUNTER — Other Ambulatory Visit: Payer: Self-pay

## 2021-01-27 MED ORDER — GLIMEPIRIDE 2 MG PO TABS
ORAL_TABLET | ORAL | 3 refills | Status: DC
  Filled 2021-01-27: qty 135, 90d supply, fill #0
  Filled 2021-06-19 – 2021-07-28 (×2): qty 135, 90d supply, fill #1
  Filled 2021-10-06: qty 135, 90d supply, fill #2
  Filled 2021-12-12: qty 135, 90d supply, fill #3

## 2021-01-30 ENCOUNTER — Encounter: Payer: Self-pay | Admitting: Internal Medicine

## 2021-01-30 ENCOUNTER — Other Ambulatory Visit: Payer: Self-pay

## 2021-01-31 ENCOUNTER — Ambulatory Visit: Payer: 59 | Attending: Neurology | Admitting: Speech Pathology

## 2021-01-31 ENCOUNTER — Other Ambulatory Visit: Payer: Self-pay

## 2021-01-31 ENCOUNTER — Encounter: Payer: Self-pay | Admitting: Internal Medicine

## 2021-01-31 DIAGNOSIS — R4701 Aphasia: Secondary | ICD-10-CM | POA: Insufficient documentation

## 2021-02-01 NOTE — Therapy (Signed)
[place] MAIN [facility] SERVICES [address] [place], [place], [address] Phone: [phone]   Fax:  [phone]  Speech Language Pathology Treatment  Patient Details  Name: Karl Morgan MRN: [id number] Date of Birth: [birth year] Referring Provider (SLP): Rosalin Hawking, MD   Encounter Date: 01/31/2021   End of Session - 02/01/21 0835     Visit Number 5    Number of Visits 25    Date for SLP Re-Evaluation 03/26/21    Authorization - Visit Number 4    Progress Note Due on Visit 10    SLP Start Time 1600    SLP Stop Time  1700    SLP Time Calculation (min) 60 min    Activity Tolerance Patient tolerated treatment well             Past Medical History:  Diagnosis Date   Alopecia 04/04/2011   Diabetes mellitus    Erectile dysfunction 04/04/2011   Heart attack ([place]) 10/07/2014   Overview:  Status post STEMI, anterior myocardial infarction status post DES stent x 2 to the LAD, mid and distal.    Heart murmur    Hyperlipidemia 04/04/2011   Hypogonadism male 04/04/2011   Myocardial infarction [facility])    Stroke [facility])    Substance abuse ([place])    Type II or unspecified type diabetes mellitus without mention of complication, uncontrolled 04/04/2011   Unspecified essential hypertension 04/04/2011    Past Surgical History:  Procedure Laterality Date   BUBBLE STUDY  08/03/2019   Procedure: BUBBLE STUDY;  Surgeon: Fay Records, MD;  Location: [facility] ENDOSCOPY;  Service: Cardiovascular;;   CARDIAC CATHETERIZATION     IR ANGIO INTRA EXTRACRAN SEL COM CAROTID INNOMINATE UNI L MOD SED  12/17/2020   IR CT HEAD LTD  12/17/2020   IR PERCUTANEOUS ART THROMBECTOMY/INFUSION INTRACRANIAL INC DIAG ANGIO  12/17/2020   IR US GUIDE VASC ACCESS RIGHT  12/17/2020   LOOP RECORDER INSERTION N/A 08/03/2019   Procedure: LOOP RECORDER INSERTION;  Surgeon: Evans Lance, MD;  Location: [place] CV LAB;  Service: Cardiovascular;  Laterality: N/A;   RADIOLOGY WITH ANESTHESIA N/A  12/17/2020   Procedure: IR WITH ANESTHESIA;  Surgeon: Luanne Bras, MD;  Location: [place];  Service: Radiology;  Laterality: N/A;   TEE WITHOUT CARDIOVERSION N/A 08/03/2019   Procedure: TRANSESOPHAGEAL ECHOCARDIOGRAM (TEE);  Surgeon: Fay Records, MD;  Location: [place];  Service: Cardiovascular;  Laterality: N/A;    There were no vitals filed for this visit.   Subjective Assessment - 02/01/21 0830     Subjective "What is that kid's game... side and heek?"    Currently in Pain? No/denies                   ADULT SLP TREATMENT - 02/01/21 0831       General Information   Behavior/Cognition Alert;Pleasant mood;Cooperative    HPI Patient is a 60 year old male admitted at [facility] 12/17/20 s/p R MCA CVA due to M1 occlusion, s/p IR with revascularization, embolic pattern, secondary to LV thrombus. PMH DM, HTN. 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 Evaluated by SLP during acute stay and found to have mild, high-level fluent aphasia. Readmitted 12/30/20 to 01/01/21 and found to have new small acute hemorrhages in right frontal and temporal lobes, LV thrombus.      Cognitive-Linquistic Treatment   Treatment focused on Aphasia;Patient/family/caregiver education    Skilled Treatment Patient reports "it's a bad afternoon." Speech is  worse at the end of the day. Just came from a meeting with administrators where he had to present. Reports he was working on a spreadsheet until the last minute. Utilizing written agenda which he feels is helpful. Education on energy conservation and creating rest breaks with minimal language demand prior to more linguistically-demanding activities such as presentations. Targeted conversation, with pt requiring cues initially, as well as rare min cues throughout, to slow rate. As he did this, pt aware of/able to correct or provide appropriate replacement for errors/anomia 90% of the time. Reports he has been using dictation for emails and this has been helpful.       Assessment / Recommendations / Plan   Plan Continue with current plan of care      Progression Toward Goals   Progression toward goals Progressing toward goals              SLP Education - 02/01/21 0834     Education Details energy conservation, slow down    Person(s) Educated Patient    Methods Explanation    Comprehension Verbalized understanding;Need further instruction              SLP Short Term Goals - 12/27/20 1322       SLP SHORT TERM GOAL #1   Title Patient will use compensations for aphasia in 10 minute mod-complex conversation with rare min cues.    Time 10    Period --   sessions   Status New      SLP SHORT TERM GOAL #2   Title Patient will ID verbal errors and attempt correction >90% of the time.    Time 10    Period --   sessions   Status New      SLP SHORT TERM GOAL #3   Title Pt will request repeats of verbal information when auditory breakdowns occur, >90% of the time.    Time 10    Period --   sessions   Status New      SLP SHORT TERM GOAL #4   Title Pt will complete further assessment of reading comprehension.    Time 10    Period --   sessions   Status New              SLP Long Term Goals - 12/27/20 1331       SLP LONG TERM GOAL #1   Title Pt will use compensations for aphasia in 20 minute complex conversation, repairing breakdowns as necessary with minimal listener cues (facial expression).    Time 12    Period Weeks    Status New    Target Date 03/26/21      SLP LONG TERM GOAL #2   Title Pt will recall >90% of key details from 20 minute presentation, using strategies/accessibility features for comprehension (note-taking, speech-to-text).    Time 12    Period Weeks    Status New    Target Date 03/26/21      SLP LONG TERM GOAL #3   Title Pt will demonstrate reading comprehension for complex multiparagraph material >90% with use of compensations allowed (text-to-speech).    Time 12    Period Weeks    Status New     Target Date 03/26/21      SLP LONG TERM GOAL #4   Title Pt will generate typed or written responses to workplace scenarios (emails, policies, etc) >37% accuracy within a reasonable amount of time, with use of compensations/accessibility features.  Time 12    Period Weeks    Status New    Target Date 03/26/21      SLP LONG TERM GOAL #5   Title Pt will report fewer communication breakdowns than prior to ST    Baseline ~15 per day    Time 12    Period Weeks    Status New    Target Date 03/26/21              Plan - 02/01/21 0835     Clinical Impression Statement Karl Morgan presents with mild, high-level fluent aphasia with expressive > receptive deficits. 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 Auditory comprehension appears functional during mod complex conversation today. With higher stress/time pressure, pt making more errors in conversation; once he slowed rate pt self-corrected 90% of errors in conversation. 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 He appears highly motivated for improvements. I recommend skilled ST to improve language for verbal/written communication, and auditory/reading comprehension, to meet demands of workplace and social/community activities.    Speech Therapy Frequency --   1-2 times per week   Duration 12 weeks    Treatment/Interventions Language facilitation;Environmental controls;Cueing hierarchy;SLP instruction and feedback;Compensatory techniques;Functional tasks;Compensatory strategies;Internal/external aids;Multimodal communcation approach;Patient/family education    Potential to Achieve Goals Good    SLP Home Exercise Plan provided    Consulted and Agree with Plan of Care Patient             Patient will benefit from skilled therapeutic intervention in order to improve the following deficits and impairments:   Aphasia    Problem List Patient Active Problem List    Diagnosis Date Noted   Need for vaccination 01/18/2021   Elevation of level of transaminase or lactic acid dehydrogenase (LDH) 01/18/2021   Elevated WBC count 01/18/2021   Chest pain 01/18/2021   Acute inferior myocardial infarction ([place]) 01/18/2021   ICH (intracerebral hemorrhage) ([place]) 12/31/2020   Thrombocytopenia ([place]) 12/23/2020   Left ventricular apical thrombus    Hypertension associated with diabetes ([place]) 11/08/2020   Elevated TSH 05/10/2020   Gallbladder polyp 11/12/2019   Fatty liver 11/12/2019   Bilateral carotid artery stenosis 08/06/2019   Mitral valve insufficiency 08/06/2019   Cerebrovascular accident (CVA) due to embolism of cerebral artery ([place]) 08/06/2019   Intracranial atherosclerosis 08/06/2019   CKD (chronic kidney disease), stage IIIa 08/03/2019   Arthritis 10/21/2018   Obesity (BMI 30-39.9) 08/22/2017   Herpes simplex type 1 infection 02/21/2015   Recurrent oral herpes simplex infection 02/21/2015   History of stroke 02/16/2015   Arteriosclerosis of coronary artery 10/07/2014   Hypotestosteronism 10/07/2014   B12 deficiency 12/18/2011   Hypothyroidism 05/04/2011   Annual physical exam 04/05/2011   Diabetes mellitus type 2, controlled, with complications ([place]) 88/32/5498   Essential hypertension 04/04/2011   Hyperlipidemia 04/04/2011   Hypogonadism male 04/04/2011   Alopecia 04/04/2011   Erectile dysfunction 04/04/2011   Deneise Lever, [place], [place] Speech-Language Pathologist  Aliene Altes 02/01/2021, 8:36 AM  [facility] [address] [place], [place], [address] Phone: [phone]   Fax:  [phone]   Name: Karl Morgan MRN: [id number] Date of Birth: Mar 22, 1961

## 2021-02-06 ENCOUNTER — Other Ambulatory Visit: Payer: Self-pay

## 2021-02-07 ENCOUNTER — Ambulatory Visit: Payer: 59 | Admitting: Speech Pathology

## 2021-02-09 ENCOUNTER — Ambulatory Visit: Payer: 59 | Admitting: Speech Pathology

## 2021-02-13 ENCOUNTER — Ambulatory Visit: Payer: 59 | Admitting: Speech Pathology

## 2021-02-16 ENCOUNTER — Encounter: Payer: 59 | Admitting: Speech Pathology

## 2021-02-20 ENCOUNTER — Ambulatory Visit (INDEPENDENT_AMBULATORY_CARE_PROVIDER_SITE_OTHER): Payer: 59

## 2021-02-20 ENCOUNTER — Other Ambulatory Visit: Payer: Self-pay

## 2021-02-20 DIAGNOSIS — I6340 Cerebral infarction due to embolism of unspecified cerebral artery: Secondary | ICD-10-CM | POA: Diagnosis not present

## 2021-02-20 LAB — CUP PACEART REMOTE DEVICE CHECK
Date Time Interrogation Session: 20221120230716
Implantable Pulse Generator Implant Date: 20210503

## 2021-02-21 ENCOUNTER — Ambulatory Visit: Payer: 59 | Admitting: Speech Pathology

## 2021-02-22 DIAGNOSIS — Z23 Encounter for immunization: Secondary | ICD-10-CM | POA: Diagnosis not present

## 2021-02-22 DIAGNOSIS — R079 Chest pain, unspecified: Secondary | ICD-10-CM | POA: Diagnosis not present

## 2021-02-22 DIAGNOSIS — I208 Other forms of angina pectoris: Secondary | ICD-10-CM | POA: Diagnosis not present

## 2021-02-22 DIAGNOSIS — R0602 Shortness of breath: Secondary | ICD-10-CM | POA: Diagnosis not present

## 2021-02-27 ENCOUNTER — Other Ambulatory Visit: Payer: Self-pay

## 2021-02-28 ENCOUNTER — Ambulatory Visit: Payer: 59 | Admitting: Speech Pathology

## 2021-02-28 NOTE — Progress Notes (Signed)
Carelink Summary Report / Loop Recorder 

## 2021-03-02 ENCOUNTER — Other Ambulatory Visit: Payer: Self-pay

## 2021-03-02 ENCOUNTER — Ambulatory Visit: Payer: 59 | Attending: Neurology | Admitting: Speech Pathology

## 2021-03-02 DIAGNOSIS — R4701 Aphasia: Secondary | ICD-10-CM | POA: Insufficient documentation

## 2021-03-02 NOTE — Therapy (Signed)
Visit arrived, no charge as no treatment provided as pt reports ready for d/c at this time.   SPEECH THERAPY DISCHARGE SUMMARY  Visits from Start of Care: 6  Current functional level related to goals / functional outcomes: Patient requests d/c as he is pleased with current functional level. Met 3/4 STGs and 4/5 LTGs.   Remaining deficits: Mild anomic aphasia persists   Education / Equipment: Compensations for aphasia, activities to support language recovery, accessibility features   Patient agrees to discharge. Patient goals were partially met. Patient is being discharged due to being pleased with the current functional level.Marland Kitchen   SLP Short Term Goals - 03/02/21 1631       SLP SHORT TERM GOAL #1   Title Patient will use compensations for aphasia in 10 minute mod-complex conversation with rare min cues.    Time 10    Period --   sessions   Status Achieved      SLP SHORT TERM GOAL #2   Title Patient will ID verbal errors and attempt correction >90% of the time.    Time 10    Period --   sessions   Status Achieved      SLP SHORT TERM GOAL #3   Title Pt will request repeats of verbal information when auditory breakdowns occur, >90% of the time.    Time 10    Period --   sessions   Status Achieved      SLP SHORT TERM GOAL #4   Title Pt will complete further assessment of reading comprehension.    Time 10    Period --   sessions   Status Deferred             SLP Long Term Goals - 03/02/21 1631       SLP LONG TERM GOAL #1   Title Pt will use compensations for aphasia in 20 minute complex conversation, repairing breakdowns as necessary with minimal listener cues (facial expression).    Time 12    Period Weeks    Status Achieved      SLP LONG TERM GOAL #2   Title Pt will recall >90% of key details from 20 minute presentation, using strategies/accessibility features for comprehension (note-taking, speech-to-text).    Baseline patient reports functioning well in  meetings and recalling details, using notes    Time 12    Period Weeks    Status Achieved      SLP LONG TERM GOAL #3   Title Pt will demonstrate reading comprehension for complex multiparagraph material >90% with use of compensations allowed (text-to-speech).    Baseline Patient reports reading emails and reports functionally    Time 12    Period Weeks    Status Achieved      SLP LONG TERM GOAL #4   Title Pt will generate typed or written responses to workplace scenarios (emails, policies, etc) >90% accuracy within a reasonable amount of time, with use of compensations/accessibility features.    Baseline takes longer to write emails but is compensating well, pleased with current functional level    Time 12    Period Weeks    Status Not Met      SLP LONG TERM GOAL #5   Title Pt will report fewer communication breakdowns than prior to ST    Baseline ~15 per day    Time 12    Period Weeks    Status Achieved  Deneise Lever, MS, [place] MAIN [facility] SERVICES [address] [place], [place], [address] Phone: [phone]   Fax:  [phone]  Patient Details  Name: Karl Morgan MRN: [id number] Date of Birth: 23-Jul-1960 Referring Provider:  Rosalin Hawking, MD  Encounter Date: 03/02/2021   Aliene Altes 03/02/2021, 4:30 PM  [facility] [address] [place], [place], [address] Phone: [phone]   Fax:  [phone]

## 2021-03-06 ENCOUNTER — Other Ambulatory Visit: Payer: Self-pay | Admitting: Internal Medicine

## 2021-03-06 ENCOUNTER — Other Ambulatory Visit: Payer: Self-pay

## 2021-03-06 MED ORDER — DAPAGLIFLOZIN PROPANEDIOL 10 MG PO TABS
10.0000 mg | ORAL_TABLET | Freq: Every day | ORAL | 3 refills | Status: DC
  Filled 2021-03-06: qty 90, 90d supply, fill #0
  Filled 2021-06-05: qty 90, 90d supply, fill #1
  Filled 2021-08-08 – 2021-09-05 (×2): qty 90, 90d supply, fill #2
  Filled 2021-10-06 – 2021-12-05 (×2): qty 90, 90d supply, fill #3

## 2021-03-07 ENCOUNTER — Other Ambulatory Visit: Payer: Self-pay

## 2021-03-10 DIAGNOSIS — E291 Testicular hypofunction: Secondary | ICD-10-CM | POA: Diagnosis not present

## 2021-03-13 ENCOUNTER — Other Ambulatory Visit: Payer: Self-pay | Admitting: Internal Medicine

## 2021-03-13 ENCOUNTER — Other Ambulatory Visit: Payer: Self-pay

## 2021-03-13 MED ORDER — PANTOPRAZOLE SODIUM 40 MG PO TBEC
40.0000 mg | DELAYED_RELEASE_TABLET | Freq: Every day | ORAL | 0 refills | Status: DC
Start: 2021-03-13 — End: 2021-06-05
  Filled 2021-03-13: qty 90, 90d supply, fill #0

## 2021-03-14 ENCOUNTER — Other Ambulatory Visit: Payer: Self-pay

## 2021-03-17 DIAGNOSIS — R079 Chest pain, unspecified: Secondary | ICD-10-CM | POA: Diagnosis not present

## 2021-03-17 DIAGNOSIS — R0602 Shortness of breath: Secondary | ICD-10-CM | POA: Diagnosis not present

## 2021-03-22 ENCOUNTER — Telehealth: Payer: Self-pay | Admitting: Internal Medicine

## 2021-03-22 ENCOUNTER — Ambulatory Visit: Payer: 59 | Admitting: Internal Medicine

## 2021-03-22 DIAGNOSIS — E291 Testicular hypofunction: Secondary | ICD-10-CM | POA: Diagnosis not present

## 2021-03-22 DIAGNOSIS — N5201 Erectile dysfunction due to arterial insufficiency: Secondary | ICD-10-CM | POA: Diagnosis not present

## 2021-03-22 NOTE — Telephone Encounter (Signed)
Patient no-showed today's appointment; appointment was for 03/22/21, provider notified for review of record. Letter sent for patient to call in and re-schedule.

## 2021-03-24 ENCOUNTER — Ambulatory Visit (INDEPENDENT_AMBULATORY_CARE_PROVIDER_SITE_OTHER): Payer: 59

## 2021-03-24 DIAGNOSIS — I6340 Cerebral infarction due to embolism of unspecified cerebral artery: Secondary | ICD-10-CM | POA: Diagnosis not present

## 2021-03-27 ENCOUNTER — Other Ambulatory Visit: Payer: Self-pay

## 2021-03-27 ENCOUNTER — Ambulatory Visit
Admission: EM | Admit: 2021-03-27 | Discharge: 2021-03-27 | Disposition: A | Discharge status: Home / Self Care | Payer: 59 | Attending: Emergency Medicine | Admitting: Emergency Medicine

## 2021-03-27 ENCOUNTER — Encounter: Payer: Self-pay | Admitting: Emergency Medicine

## 2021-03-27 DIAGNOSIS — I10 Essential (primary) hypertension: Secondary | ICD-10-CM | POA: Diagnosis not present

## 2021-03-27 DIAGNOSIS — J0100 Acute maxillary sinusitis, unspecified: Secondary | ICD-10-CM

## 2021-03-27 LAB — CUP PACEART REMOTE DEVICE CHECK
Date Time Interrogation Session: 20221223230258
Implantable Pulse Generator Implant Date: 20210503

## 2021-03-27 MED ORDER — AMOXICILLIN 875 MG PO TABS: 875.0000 mg | ORAL_TABLET | Freq: Two times a day (BID) | ORAL | 0 refills | Status: AC

## 2021-03-27 NOTE — Discharge Instructions (Addendum)
Take the amoxicillin as directed.  Follow up with your primary care provider if your symptoms are not improving.    Your blood pressure is elevated today at 135/98.  Please have this rechecked by your primary care provider in 2-4 weeks.

## 2021-03-27 NOTE — ED Triage Notes (Signed)
Pt c/o cough and nasal congestion x 3 days

## 2021-03-27 NOTE — ED Provider Notes (Signed)
Karl Morgan    CSN: [id number] Arrival date & time: 03/27/21  0805      History   Chief Complaint Chief Complaint  Patient presents with   Cough   Nasal Congestion    HPI Karl Morgan is a 60 y.o. male.  Patient presents with history of sinus congestion and congestion; worse with cough, postnasal drip, and sinus pressure x 3-4 days.  No fever, rash, chest pain, shortness of breath, or other symptoms.  Treatment at home with Tylenol taken yesterday.  His medical history includes hypertension, diabetes, stroke, CKD, MI.  The history is provided by the patient and medical records.   Past Medical History:  Diagnosis Date   Alopecia 04/04/2011   Diabetes mellitus    Erectile dysfunction 04/04/2011   Heart attack ([place]) 10/07/2014   Overview:  Status post STEMI, anterior myocardial infarction status post DES stent x 2 to the LAD, mid and distal.    Heart murmur    Hyperlipidemia 04/04/2011   Hypogonadism male 04/04/2011   Myocardial infarction [facility])    Stroke [facility])    Substance abuse ([place])    Type II or unspecified type diabetes mellitus without mention of complication, uncontrolled 04/04/2011   Unspecified essential hypertension 04/04/2011    Patient Active Problem List   Diagnosis Date Noted   Need for vaccination 01/18/2021   Elevation of level of transaminase or lactic acid dehydrogenase (LDH) 01/18/2021   Elevated WBC count 01/18/2021   Chest pain 01/18/2021   Acute inferior myocardial infarction ([place]) 01/18/2021   ICH (intracerebral hemorrhage) ([place]) 12/31/2020   Thrombocytopenia ([place]) 12/23/2020   Left ventricular apical thrombus    Hypertension associated with diabetes ([place]) 11/08/2020   Elevated TSH 05/10/2020   Gallbladder polyp 11/12/2019   Fatty liver 11/12/2019   Bilateral carotid artery stenosis 08/06/2019   Mitral valve insufficiency 08/06/2019   Cerebrovascular accident (CVA) due to embolism of cerebral artery ([place]) 08/06/2019   Intracranial  atherosclerosis 08/06/2019   CKD (chronic kidney disease), stage IIIa 08/03/2019   Arthritis 10/21/2018   Obesity (BMI 30-39.9) 08/22/2017   Herpes simplex type 1 infection 02/21/2015   Recurrent oral herpes simplex infection 02/21/2015   History of stroke 02/16/2015   Arteriosclerosis of coronary artery 10/07/2014   Hypotestosteronism 10/07/2014   B12 deficiency 12/18/2011   Hypothyroidism 05/04/2011   Annual physical exam 04/05/2011   Diabetes mellitus type 2, controlled, with complications ([place]) 88/41/6606   Essential hypertension 04/04/2011   Hyperlipidemia 04/04/2011   Hypogonadism male 04/04/2011   Alopecia 04/04/2011   Erectile dysfunction 04/04/2011    Past Surgical History:  Procedure Laterality Date   BUBBLE STUDY  08/03/2019   Procedure: BUBBLE STUDY;  Surgeon: Fay Records, MD;  Location: [facility] ENDOSCOPY;  Service: Cardiovascular;;   CARDIAC CATHETERIZATION     IR ANGIO INTRA EXTRACRAN SEL COM CAROTID INNOMINATE UNI L MOD SED  12/17/2020   IR CT HEAD LTD  12/17/2020   IR PERCUTANEOUS ART THROMBECTOMY/INFUSION INTRACRANIAL INC DIAG ANGIO  12/17/2020   IR US GUIDE VASC ACCESS RIGHT  12/17/2020   LOOP RECORDER INSERTION N/A 08/03/2019   Procedure: LOOP RECORDER INSERTION;  Surgeon: Evans Lance, MD;  Location: [place] CV LAB;  Service: Cardiovascular;  Laterality: N/A;   RADIOLOGY WITH ANESTHESIA N/A 12/17/2020   Procedure: IR WITH ANESTHESIA;  Surgeon: Luanne Bras, MD;  Location: [place];  Service: Radiology;  Laterality: N/A;   TEE WITHOUT CARDIOVERSION N/A 08/03/2019   Procedure: TRANSESOPHAGEAL ECHOCARDIOGRAM (TEE);  Surgeon:  Fay Records, MD;  Location: [facility] ENDOSCOPY;  Service: Cardiovascular;  Laterality: N/A;       Home Medications    Prior to Admission medications   Medication Sig Start Date End Date Taking? Authorizing Provider  amoxicillin (AMOXIL) 875 MG tablet Take 1 tablet (875 mg total) by mouth 2 (two) times daily for 7 days. 03/27/21 04/03/21 Yes  Sharion Balloon, NP  apixaban (ELIQUIS) 5 MG TABS tablet Take 1 tablet (5 mg total) by mouth every 12 (twelve) hours 01/05/21     atorvastatin (LIPITOR) 80 MG tablet Take 1 tablet (80 mg total) by mouth daily. 12/21/20   Vonzella Nipple, NP  dapagliflozin propanediol (FARXIGA) 10 MG TABS tablet Take 1 tablet (10 mg total) by mouth daily. 03/06/21   McLean-Scocuzza, Nino Glow, MD  ezetimibe (ZETIA) 10 MG tablet Take 1 tablet (10 mg total) by mouth daily. 12/21/20   Vonzella Nipple, NP  glimepiride (AMARYL) 2 MG tablet TAKE 1 TABLET (2 MG TOTAL) BY MOUTH 2 (TWO) TIMES DAILY WITH A MEAL. Patient taking differently: Take 2 mg by mouth daily with breakfast. 05/10/20 05/10/21  McLean-Scocuzza, Nino Glow, MD  glimepiride (AMARYL) 2 MG tablet Take 1 tablet (2 mg total) by mouth daily with breakfast , 1/2 a tab (1mg ) daily with supper. 01/27/21     levothyroxine (SYNTHROID) 75 MCG tablet Take 1 tablet (75 mcg total) by mouth daily. 12/20/20   Vonzella Nipple, NP  lisinopril (ZESTRIL) 40 MG tablet TAKE 1 TABLET (40 MG TOTAL) BY MOUTH DAILY. 12/20/20 12/20/21  Vonzella Nipple, NP  metFORMIN (GLUCOPHAGE) 850 MG tablet TAKE 1 TABLET BY MOUTH TWICE DAILY 12/20/20 12/20/21  Vonzella Nipple, NP  metoprolol succinate (TOPROL-XL) 50 MG 24 hr tablet TAKE 1 TABLET BY MOUTH DAILY. TAKE WITH OR IMMEDIATELY FOLLOWING A MEAL. 12/20/20 12/20/21  Vonzella Nipple, NP  pantoprazole (PROTONIX) 40 MG tablet Take 1 tablet (40 mg total) by mouth daily. 03/13/21   McLean-Scocuzza, Nino Glow, MD  sitaGLIPtin (JANUVIA) 100 MG tablet TAKE 1 TABLET (100 MG TOTAL) BY MOUTH DAILY. 12/20/20 12/20/21  Vonzella Nipple, NP  valACYclovir (VALTREX) 1000 MG tablet take 1 to 2 tablets by mouth every 12 hours for 2 to 3 days as needed for fever blisters Patient not taking: Reported on 01/18/2021 12/20/20   Vonzella Nipple, NP    Family History Family History  Problem Relation Age of Onset   Hypertension Father    Dementia Father         died age 17 in 07-13-17   Memory loss Father    Emphysema Mother    COPD Mother    Thyroid disease Mother    Cancer Neg Hx    Diabetes Neg Hx    Heart disease Neg Hx    Stroke Neg Hx     Social History Social History   Tobacco Use   Smoking status: Never   Smokeless tobacco: Never  Vaping Use   Vaping Use: Never used  Substance Use Topics   Alcohol use: No    Comment: former etoh abuse in 1980s    Drug use: No     Allergies   Other and Pravachol [pravastatin sodium]   Review of Systems Review of Systems  Constitutional:  Negative for chills and fever.  HENT:  Positive for congestion, postnasal drip and rhinorrhea. Negative for ear pain and sore throat.   Respiratory:  Positive for cough. Negative for shortness of breath.   Cardiovascular:  Negative for chest pain and palpitations.  Gastrointestinal:  Negative for diarrhea and vomiting.  Skin:  Negative for color change and rash.  All other systems reviewed and are negative.   Physical Exam Triage Vital Signs ED Triage Vitals  Enc Vitals Group     BP 03/27/21 0822 (!) 135/98     Pulse Rate 03/27/21 0822 85     Resp 03/27/21 0822 16     Temp 03/27/21 0822 98.8 F (37.1 C)     Temp Source 03/27/21 0822 Oral     SpO2 03/27/21 0822 96 %     Weight --      Height --      Head Circumference --      Peak Flow --      Pain Score 03/27/21 0820 0     Pain Loc --      Pain Edu? --      Excl. in [place]? --    No data found.  Updated Vital Signs BP (!) 135/98 (BP Location: Left Arm) Comment: Patient has not taken BP meds today   Pulse 85    Temp 98.8 F (37.1 C) (Oral)    Resp 16    SpO2 96%   Visual Acuity Right Eye Distance:   Left Eye Distance:   Bilateral Distance:    Right Eye Near:   Left Eye Near:    Bilateral Near:     Physical Exam Vitals and nursing note reviewed.  Constitutional:      General: He is not in acute distress.    Appearance: He is well-developed. He is obese.  HENT:     Right Ear:  Tympanic membrane normal.     Left Ear: Tympanic membrane normal.     Nose: Congestion present.     Mouth/Throat:     Mouth: Mucous membranes are moist.     Pharynx: Oropharynx is clear.  Cardiovascular:     Rate and Rhythm: Normal rate and regular rhythm.     Heart sounds: Normal heart sounds.  Pulmonary:     Effort: Pulmonary effort is normal. No respiratory distress.     Breath sounds: Normal breath sounds.  Musculoskeletal:     Cervical back: Neck supple.  Skin:    General: Skin is warm and dry.  Neurological:     Mental Status: He is alert.  Psychiatric:        Mood and Affect: Mood normal.        Behavior: Behavior normal.     UC Treatments / Results  Labs (all labs ordered are listed, but only abnormal results are displayed) Labs Reviewed - No data to display  EKG   Radiology No results found.  Procedures Procedures (including critical care time)  Medications Ordered in UC Medications - No data to display  Initial Impression / Assessment and Plan / UC Course  I have reviewed the triage vital signs and the nursing notes.  Pertinent labs & imaging results that were available during my care of the patient were reviewed by me and considered in my medical decision making (see chart for details).   Acute sinusitis, Elevated blood pressure with HTN.   Patient has been symptomatic for 2 weeks and is getting worse.  Treating with amoxicillin.  Instructed him to follow-up with PCP if his symptoms are not improving.  Also discussed his blood pressure is elevated today and needs to be rechecked by his PCP in 2 to 4 weeks.  Education provided  on managing hypertension.  He agrees to plan of care.  Final Clinical Impressions(s) / UC Diagnoses   Final diagnoses:  Acute non-recurrent maxillary sinusitis  Elevated blood pressure reading in office with diagnosis of hypertension     Discharge Instructions      Take the amoxicillin as directed.  Follow up with your  primary care provider if your symptoms are not improving.    Your blood pressure is elevated today at 135/98.  Please have this rechecked by your primary care provider in 2-4 weeks.          ED Prescriptions     Medication Sig Dispense Auth. Provider   amoxicillin (AMOXIL) 875 MG tablet Take 1 tablet (875 mg total) by mouth 2 (two) times daily for 7 days. 14 tablet Sharion Balloon, NP      PDMP not reviewed this encounter.   Sharion Balloon, NP 03/27/21 [phone]

## 2021-03-29 ENCOUNTER — Other Ambulatory Visit: Payer: Self-pay

## 2021-03-29 NOTE — Patient Outreach (Signed)
[facility]) Care Management  03/29/2021  Karl Morgan September 19, 1960 [id number]   Telephone outreach to patient to obtain mRS was successfully completed. MRS=1   [place] Care Management Assistant

## 2021-03-30 ENCOUNTER — Other Ambulatory Visit: Payer: Self-pay

## 2021-03-30 DIAGNOSIS — E1169 Type 2 diabetes mellitus with other specified complication: Secondary | ICD-10-CM | POA: Diagnosis not present

## 2021-03-30 DIAGNOSIS — E039 Hypothyroidism, unspecified: Secondary | ICD-10-CM | POA: Diagnosis not present

## 2021-03-30 DIAGNOSIS — E119 Type 2 diabetes mellitus without complications: Secondary | ICD-10-CM | POA: Diagnosis not present

## 2021-03-30 DIAGNOSIS — E1159 Type 2 diabetes mellitus with other circulatory complications: Secondary | ICD-10-CM | POA: Diagnosis not present

## 2021-03-30 MED ORDER — MOUNJARO 2.5 MG/0.5ML ~~LOC~~ SOAJ
SUBCUTANEOUS | 5 refills | Status: DC
  Filled 2021-03-30: qty 2, 28d supply, fill #0

## 2021-04-04 ENCOUNTER — Other Ambulatory Visit: Payer: Self-pay

## 2021-04-04 NOTE — Progress Notes (Signed)
Carelink Summary Report / Loop Recorder 

## 2021-04-18 ENCOUNTER — Other Ambulatory Visit: Payer: Self-pay

## 2021-04-21 ENCOUNTER — Other Ambulatory Visit: Payer: Self-pay

## 2021-04-21 MED ORDER — MOUNJARO 5 MG/0.5ML ~~LOC~~ SOAJ
SUBCUTANEOUS | 12 refills | Status: DC
  Filled 2021-04-21: qty 2, 28d supply, fill #0
  Filled 2021-05-26: qty 2, 28d supply, fill #1

## 2021-05-01 ENCOUNTER — Ambulatory Visit (INDEPENDENT_AMBULATORY_CARE_PROVIDER_SITE_OTHER): Payer: 59

## 2021-05-01 DIAGNOSIS — I6340 Cerebral infarction due to embolism of unspecified cerebral artery: Secondary | ICD-10-CM | POA: Diagnosis not present

## 2021-05-01 LAB — CUP PACEART REMOTE DEVICE CHECK
Date Time Interrogation Session: 20230129230716
Implantable Pulse Generator Implant Date: 20210503

## 2021-05-02 ENCOUNTER — Other Ambulatory Visit: Payer: Self-pay | Admitting: Internal Medicine

## 2021-05-02 ENCOUNTER — Other Ambulatory Visit: Payer: Self-pay

## 2021-05-02 MED ORDER — EZETIMIBE 10 MG PO TABS
10.0000 mg | ORAL_TABLET | Freq: Every day | ORAL | 0 refills | Status: DC
  Filled 2021-05-02: qty 90, 90d supply, fill #0

## 2021-05-02 MED ORDER — ATORVASTATIN CALCIUM 80 MG PO TABS
80.0000 mg | ORAL_TABLET | Freq: Every day | ORAL | 0 refills | Status: DC
  Filled 2021-05-02: qty 90, 90d supply, fill #0

## 2021-05-08 ENCOUNTER — Other Ambulatory Visit: Payer: Self-pay

## 2021-05-08 NOTE — Progress Notes (Signed)
Carelink Summary Report / Loop Recorder 

## 2021-05-15 DIAGNOSIS — E1149 Type 2 diabetes mellitus with other diabetic neurological complication: Secondary | ICD-10-CM | POA: Diagnosis not present

## 2021-05-15 DIAGNOSIS — Z8673 Personal history of transient ischemic attack (TIA), and cerebral infarction without residual deficits: Secondary | ICD-10-CM | POA: Diagnosis not present

## 2021-05-16 ENCOUNTER — Ambulatory Visit: Payer: 59 | Admitting: Internal Medicine

## 2021-05-23 ENCOUNTER — Telehealth: Payer: Self-pay | Admitting: Internal Medicine

## 2021-05-23 ENCOUNTER — Ambulatory Visit: Payer: 59 | Admitting: Internal Medicine

## 2021-05-23 NOTE — Telephone Encounter (Signed)
Patient no-showed today's appointment; appointment was for 05/23/21, provider notified for review of record. Letter sent for patient to call in and re-schedule.

## 2021-05-25 ENCOUNTER — Other Ambulatory Visit: Payer: Self-pay

## 2021-05-26 ENCOUNTER — Other Ambulatory Visit: Payer: Self-pay

## 2021-05-29 ENCOUNTER — Other Ambulatory Visit: Payer: Self-pay

## 2021-06-05 ENCOUNTER — Ambulatory Visit (INDEPENDENT_AMBULATORY_CARE_PROVIDER_SITE_OTHER): Payer: 59

## 2021-06-05 ENCOUNTER — Other Ambulatory Visit: Payer: Self-pay | Admitting: Internal Medicine

## 2021-06-05 ENCOUNTER — Other Ambulatory Visit: Payer: Self-pay

## 2021-06-05 DIAGNOSIS — I6340 Cerebral infarction due to embolism of unspecified cerebral artery: Secondary | ICD-10-CM

## 2021-06-05 LAB — CUP PACEART REMOTE DEVICE CHECK
Date Time Interrogation Session: 20230303230346
Implantable Pulse Generator Implant Date: 20210503

## 2021-06-05 MED ORDER — PANTOPRAZOLE SODIUM 40 MG PO TBEC
40.0000 mg | DELAYED_RELEASE_TABLET | Freq: Every day | ORAL | 0 refills | Status: DC
  Filled 2021-06-05: qty 90, 90d supply, fill #0

## 2021-06-06 ENCOUNTER — Other Ambulatory Visit: Payer: Self-pay

## 2021-06-13 NOTE — Progress Notes (Signed)
Carelink Summary Report / Loop Recorder 

## 2021-06-19 ENCOUNTER — Other Ambulatory Visit: Payer: Self-pay

## 2021-06-20 ENCOUNTER — Other Ambulatory Visit: Payer: Self-pay

## 2021-06-20 ENCOUNTER — Encounter: Payer: Self-pay | Admitting: Internal Medicine

## 2021-06-20 ENCOUNTER — Ambulatory Visit: Payer: 59 | Admitting: Internal Medicine

## 2021-06-20 VITALS — BP 114/80 | HR 74 | Temp 97.5°F | Ht 73.0 in | Wt 232.1 lb

## 2021-06-20 DIAGNOSIS — I10 Essential (primary) hypertension: Secondary | ICD-10-CM

## 2021-06-20 DIAGNOSIS — E039 Hypothyroidism, unspecified: Secondary | ICD-10-CM

## 2021-06-20 DIAGNOSIS — R4701 Aphasia: Secondary | ICD-10-CM

## 2021-06-20 DIAGNOSIS — E1165 Type 2 diabetes mellitus with hyperglycemia: Secondary | ICD-10-CM | POA: Diagnosis not present

## 2021-06-20 DIAGNOSIS — I513 Intracardiac thrombosis, not elsewhere classified: Secondary | ICD-10-CM | POA: Diagnosis not present

## 2021-06-20 DIAGNOSIS — E162 Hypoglycemia, unspecified: Secondary | ICD-10-CM | POA: Diagnosis not present

## 2021-06-20 DIAGNOSIS — Z8673 Personal history of transient ischemic attack (TIA), and cerebral infarction without residual deficits: Secondary | ICD-10-CM | POA: Diagnosis not present

## 2021-06-20 DIAGNOSIS — I639 Cerebral infarction, unspecified: Secondary | ICD-10-CM

## 2021-06-20 MED ORDER — LISINOPRIL 40 MG PO TABS
ORAL_TABLET | Freq: Every day | ORAL | 3 refills | Status: DC
  Filled 2021-06-20 – 2021-08-07 (×2): qty 90, 90d supply, fill #0
  Filled 2021-10-30: qty 90, 90d supply, fill #1

## 2021-06-20 MED ORDER — LEVOTHYROXINE SODIUM 75 MCG PO TABS
75.0000 ug | ORAL_TABLET | Freq: Every day | ORAL | 3 refills | Status: DC
  Filled 2021-06-20 – 2021-09-05 (×2): qty 90, 90d supply, fill #0
  Filled 2021-12-05: qty 90, 90d supply, fill #1

## 2021-06-20 MED ORDER — ATORVASTATIN CALCIUM 80 MG PO TABS
80.0000 mg | ORAL_TABLET | Freq: Every day | ORAL | 3 refills | Status: DC
  Filled 2021-06-20 – 2021-07-25 (×2): qty 90, 90d supply, fill #0
  Filled 2021-10-23: qty 90, 90d supply, fill #1

## 2021-06-20 MED ORDER — METFORMIN HCL 850 MG PO TABS
ORAL_TABLET | Freq: Two times a day (BID) | ORAL | 3 refills | Status: DC
  Filled 2021-06-20 – 2021-08-30 (×2): qty 180, 90d supply, fill #0
  Filled 2021-12-05: qty 180, 90d supply, fill #1

## 2021-06-20 MED ORDER — PANTOPRAZOLE SODIUM 40 MG PO TBEC
40.0000 mg | DELAYED_RELEASE_TABLET | Freq: Every day | ORAL | 3 refills | Status: DC
  Filled 2021-06-20 – 2021-09-05 (×2): qty 90, 90d supply, fill #0
  Filled 2021-12-05: qty 90, 90d supply, fill #1

## 2021-06-20 MED ORDER — EZETIMIBE 10 MG PO TABS
10.0000 mg | ORAL_TABLET | Freq: Every day | ORAL | 3 refills | Status: DC
Start: 2021-06-20 — End: 2021-12-21
  Filled 2021-06-20 – 2021-08-07 (×2): qty 90, 90d supply, fill #0
  Filled 2021-10-30: qty 90, 90d supply, fill #1

## 2021-06-20 MED ORDER — METOPROLOL SUCCINATE ER 50 MG PO TB24
ORAL_TABLET | Freq: Every day | ORAL | 3 refills | Status: DC
  Filled 2021-06-20 – 2021-08-30 (×2): qty 90, 90d supply, fill #0
  Filled 2021-11-27: qty 90, 90d supply, fill #1

## 2021-06-20 MED ORDER — VALACYCLOVIR HCL 1 G PO TABS
ORAL_TABLET | ORAL | 3 refills | Status: DC
Start: 2021-06-20 — End: 2021-12-21
  Filled 2021-06-20: qty 90, 45d supply, fill #0

## 2021-06-20 NOTE — Patient Instructions (Addendum)
Stop Glimeperide and Januvia  ?Over the counter glucose tablets  ?Pneumonia 23 vaccine due 11/08/21 ? ?Urology follow up every August  ? ?Hypoglycemia ?Hypoglycemia occurs when the level of sugar (glucose) in the blood is too low. Hypoglycemia can happen in people who have or do not have diabetes. It can develop quickly, and it can be a medical emergency. For most people, a blood glucose level below 70 mg/dL (3.9 mmol/L) is considered hypoglycemia. ?Glucose is a type of sugar that provides the body's main source of energy. Certain hormones (insulin and glucagon) control the level of glucose in the blood. Insulin lowers blood glucose, and glucagon raises blood glucose. Hypoglycemia can result from having too much insulin in the bloodstream, or from not eating enough food that contains glucose. You may also have reactive hypoglycemia, which happens within 4 hours after eating a meal. ?What are the causes? ?Hypoglycemia occurs most often in people who have diabetes and may be caused by: ?Diabetes medicine. ?Not eating enough, or not eating often enough. ?Increased physical activity. ?Drinking alcohol on an empty stomach. ?If you do not have diabetes, hypoglycemia may be caused by: ?A tumor in the pancreas. ?Not eating enough, or not eating for long periods at a time (fasting). ?A severe infection or illness. ?Problems after having bariatric surgery. ?Organ failure, such as kidney or liver failure. ?Certain medicines. ?What increases the risk? ?Hypoglycemia is more likely to develop in people who: ?Have diabetes and take medicines to lower blood glucose. ?Abuse alcohol. ?Have a severe illness. ?What are the signs or symptoms? ?Symptoms vary depending on whether the condition is mild, moderate, or severe. ?Mild hypoglycemia ?Hunger. ?Sweating and feeling clammy. ?Dizziness or feeling light-headed. ?Sleepiness or restless sleep. ?Nausea. ?Increased heart rate. ?Headache. ?Blurry vision. ?Mood changes, such as irritability  or anxiety. ?Tingling or numbness around the mouth, lips, or tongue. ?Moderate hypoglycemia ?Confusion and poor judgment. ?Behavior changes. ?Weakness. ?Irregular heartbeat. ?A change in coordination. ?Severe hypoglycemia ?Severe hypoglycemia is a medical emergency. It can cause: ?Fainting. ?Seizures. ?Loss of consciousness (coma). ?Death. ?How is this diagnosed? ?Hypoglycemia is diagnosed with a blood test to measure your blood glucose level. This blood test is done while you are having symptoms. Your health care provider may also do a physical exam and review your medical history. ?How is this treated? ?This condition can be treated by immediately eating or drinking something that contains sugar with 15 grams of fast-acting carbohydrate, such as: ?4 oz (120 mL) of fruit juice. ?4 oz (120 mL) of regular soda (not diet soda). ?Several pieces of hard candy. Check food labels to find out how many pieces to eat for 15 grams. ?1 Tbsp (15 mL) of sugar or honey. ?4 glucose tablets. ?1 tube of glucose gel. ?Treating hypoglycemia if you have diabetes ?If you are alert and able to swallow safely, follow the 15:15 rule: ?Take 15 grams of a fast-acting carbohydrate. Talk with your health care provider about how much you should take. Options for getting 15 grams of fast-acting carbohydrate include: ?Glucose tablets (take 4 tablets). ?Several pieces of hard candy. Check food labels to find out how many pieces to eat for 15 grams. ?4 oz (120 mL) of fruit juice. ?4 oz (120 mL) of regular soda (not diet soda). ?1 Tbsp (15 mL) of sugar or honey. ?1 tube of glucose gel. ?Check your blood glucose 15 minutes after you take the carbohydrate. ?If the repeat blood glucose level is still at or below 70 mg/dL (3.9 mmol/L), take   15 grams of a carbohydrate again. ?If your blood glucose level does not increase above 70 mg/dL (3.9 mmol/L) after 3 tries, seek emergency medical care. ?After your blood glucose level returns to normal, eat a meal  or a snack within 1 hour. ? ?Treating severe hypoglycemia ?Severe hypoglycemia is when your blood glucose level is below 54 mg/dL (3 mmol/L). Severe hypoglycemia is a medical emergency. Get medical help right away. ?If you have severe hypoglycemia and you cannot eat or drink, you will need to be given glucagon. A family member or close friend should learn how to check your blood glucose and how to give you glucagon. Ask your health care provider if you need to have an emergency glucagon kit available. ?Severe hypoglycemia may need to be treated in a hospital. The treatment may include getting glucose through an IV. You may also need treatment for the cause of your hypoglycemia. ?Follow these instructions at home: ?General instructions ?Take over-the-counter and prescription medicines only as told by your health care provider. ?Monitor your blood glucose as told by your health care provider. ?If you drink alcohol: ?Limit how much you have to: ?0-1 drink a day for women who are not pregnant. ?0-2 drinks a day for men. ?Know how much alcohol is in your drink. In the [place] one drink equals one 12 oz bottle of beer (355 mL), one 5 oz glass of wine (148 mL), or one 1? oz glass of hard liquor (44 mL). ?Be sure to eat food along with drinking alcohol. ?Be aware that alcohol is absorbed quickly and may have lingering effects that may result in hypoglycemia later. Be sure to do ongoing glucose monitoring. ?Keep all follow-up visits. This is important. ?If you have diabetes: ?Always have a fast-acting carbohydrate (15 grams) option with you to treat low blood glucose. ?Follow your diabetes management plan as directed by your health care provider. Make sure you: ?Know the symptoms of hypoglycemia. It is important to treat it right away to prevent it from becoming severe. ?Check your blood glucose as often as told. Always check before and after exercise. ?Always check your blood glucose before you drive a motorized  vehicle. ?Take your medicines as told. ?Follow your meal plan. Eat on time, and do not skip meals. ?Share your diabetes management plan with people in your workplace, school, and household. ?Carry a medical alert card or wear medical alert jewelry. ?Where to find more information ?American Diabetes Association: www.diabetes.org ?Contact a health care provider if: ?You have problems keeping your blood glucose in your target range. ?You have frequent episodes of hypoglycemia. ?Get help right away if: ?You continue to have hypoglycemia symptoms after eating or drinking something that contains 15 grams of fast-acting carbohydrate, and you cannot get your blood glucose above 70 mg/dL (3.9 mmol/L) while following the 15:15 rule. ?Your blood glucose is below 54 mg/dL (3 mmol/L). ?You have a seizure. ?You faint. ?These symptoms may represent a serious problem that is an emergency. Do not wait to see if the symptoms will go away. Get medical help right away. Call your local emergency services (911 in the [place] Do not drive yourself to the hospital. ?Summary ?Hypoglycemia occurs when the level of sugar (glucose) in the blood is too low. ?Hypoglycemia can happen in people who have or do not have diabetes. It can develop quickly, and it can be a medical emergency. ?Make sure you know the symptoms of hypoglycemia and how to treat it. ?Always have a fast-acting carbohydrate option  with you to treat low blood sugar. ?This information is not intended to replace advice given to you by your health care provider. Make sure you discuss any questions you have with your health care provider. ?Document Revised: 02/18/2020 Document Reviewed: 02/18/2020 ?Elsevier Patient Education ? [place]. ? ?

## 2021-06-20 NOTE — Progress Notes (Addendum)
Chief Complaint  ?Patient presents with  ? Follow-up  ? ?F/u  ?1. [place] stroke 12/17/20 and he was on coumadin per pt coumadin 5 mg most days and 2.5 mg on Fridays and INR was 6.6-10 supratherapeutic and he had to go to the hospital urgently due to supratherapeutic INR. This as his 3rd stroke +LV thrombus established [facility] neurology, cardiology Dr. Clayborn Bigness. He is currently on eliquis 5 mg bid he is off plavix and aspirin  ?MRI  ?1. Small to moderate-sized acute right MCA infarct. ?2. Chronic right frontal and bilateral cerebellar infarcts. ?3. Negative head MRA. ?  ?His deficits include aphagia I.e trouble with his speech or thinking of words while speaking or names. He was depressed right after stroke but is doing better  ? ? ?2. DM 2 now on mounjaro 5 mg weekly A1c 03/30/21 was 9.6 to 6.7 12/18/20 he has endocrine f/u 08/17/21 he is supposed to increase to 7.5 weekly he is also on farxiga 10 mg qd, metformin 850 mg bid, amaryl 2 mg qam and 1 mg qpm and Januvia 100 mg qd he does report lows to the 49 to 50s at times  ?Advised pt to stop Januvia 100 mg qd and amaryl 2 mg am and 1 mg qhs  ? ? ?Review of Systems  ?Constitutional:  Negative for weight loss.  ?HENT:  Negative for hearing loss.   ?Eyes:  Negative for blurred vision.  ?Respiratory:  Negative for shortness of breath.   ?Cardiovascular:  Negative for chest pain.  ?Gastrointestinal:  Negative for abdominal pain and blood in stool.  ?Musculoskeletal:  Negative for back pain.  ?Skin:  Negative for rash.  ?Neurological:  Negative for headaches.  ?     +aphasia due to stroke  ?Psychiatric/Behavioral:  Negative for depression.   ?Past Medical History:  ?Diagnosis Date  ? Alopecia 04/04/2011  ? Diabetes mellitus   ? Erectile dysfunction 04/04/2011  ? Heart attack ([place]) 10/07/2014  ? Overview:  Status post STEMI, anterior myocardial infarction status post DES stent x 2 to the LAD, mid and distal.   ? Heart murmur   ? Hyperlipidemia 04/04/2011  ? Hypogonadism male  04/04/2011  ? Myocardial infarction [facility])   ? Stroke [facility])   ? x 3 as of 05/2021 last stroke 12/17/20 with aphasia  ? Substance abuse ([place])   ? Type II or unspecified type diabetes mellitus without mention of complication, uncontrolled 04/04/2011  ? Unspecified essential hypertension 04/04/2011  ? ?Past Surgical History:  ?Procedure Laterality Date  ? BUBBLE STUDY  08/03/2019  ? Procedure: BUBBLE STUDY;  Surgeon: Fay Records, MD;  Location: [place];  Service: Cardiovascular;;  ? CARDIAC CATHETERIZATION    ? IR ANGIO INTRA EXTRACRAN SEL COM CAROTID INNOMINATE UNI L MOD SED  12/17/2020  ? IR CT HEAD LTD  12/17/2020  ? IR PERCUTANEOUS ART THROMBECTOMY/INFUSION INTRACRANIAL INC DIAG ANGIO  12/17/2020  ? IR US GUIDE VASC ACCESS RIGHT  12/17/2020  ? LOOP RECORDER INSERTION N/A 08/03/2019  ? Procedure: LOOP RECORDER INSERTION;  Surgeon: Evans Lance, MD;  Location: [place] [facility];  Service: Cardiovascular;  Laterality: N/A;  ? RADIOLOGY WITH ANESTHESIA N/A 12/17/2020  ? Procedure: IR WITH ANESTHESIA;  Surgeon: Luanne Bras, MD;  Location: [place];  Service: Radiology;  Laterality: N/A;  ? TEE WITHOUT CARDIOVERSION N/A 08/03/2019  ? Procedure: TRANSESOPHAGEAL ECHOCARDIOGRAM (TEE);  Surgeon: Fay Records, MD;  Location: [place];  Service: Cardiovascular;  Laterality: N/A;  ? ?Family History  ?  Problem Relation Age of Onset  ? Hypertension Father   ? Dementia Father   ?     died age 54 in Jul 09, 2017  ? Memory loss Father   ? Emphysema Mother   ? COPD Mother   ? Thyroid disease Mother   ? Cancer Neg Hx   ? Diabetes Neg Hx   ? Heart disease Neg Hx   ? Stroke Neg Hx   ? ?Social History  ? ?Socioeconomic History  ? Marital status: Married  ?  Spouse name: Not on file  ? Number of children: Not on file  ? Years of education: Not on file  ? Highest education level: Not on file  ?Occupational History  ? Occupation: OPERATIONS MANAGER of maintenance  ?  Employer:   ?Tobacco Use  ? Smoking status: Never  ? Smokeless  tobacco: Never  ?Vaping Use  ? Vaping Use: Never used  ?Substance and Sexual Activity  ? Alcohol use: No  ?  Comment: former etoh abuse in 1980s   ? Drug use: No  ? Sexual activity: Yes  ?Other Topics Concern  ? Not on file  ?Social History Narrative  ? Caffienated drinks-no  ? Seat belt use often-yes  ? Regular Exercise-no  ? Smoke alarm in the home-yes  ? Firearms/guns in the home-yes  ? History of physical abuse-no  ?   ? Married 2 kids daughter and son   ? Oldest of siblings has 2 brothers and 1 sister   ? Never smoker   ? 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 works in Education administrator   ?   ? DPR wife Vickie   ?   ?   ?   ? ?Social Determinants of Health  ? ?Financial Resource Strain: Not on file  ?Food Insecurity: Not on file  ?Transportation Needs: Not on file  ?Physical Activity: Not on file  ?Stress: Not on file  ?Social Connections: Not on file  ?Intimate Partner Violence: Not on file  ? ?Current Meds  ?Medication Sig  ? apixaban (ELIQUIS) 5 MG TABS tablet Take 1 tablet (5 mg total) by mouth every 12 (twelve) hours  ? dapagliflozin propanediol (FARXIGA) 10 MG TABS tablet Take 1 tablet (10 mg total) by mouth daily.  ? glimepiride (AMARYL) 2 MG tablet Take 1 tablet (2 mg total) by mouth daily with breakfast , 1/2 a tab ('1mg'$ ) daily with supper.  ? sitaGLIPtin (JANUVIA) 100 MG tablet TAKE 1 TABLET (100 MG TOTAL) BY MOUTH DAILY.  ? tirzepatide (MOUNJARO) 5 MG/0.5ML Pen Inject 0.5 mLs (5 mg total) subcutaneously every 7 (seven) days  ? [DISCONTINUED] atorvastatin (LIPITOR) 80 MG tablet Take 1 tablet (80 mg total) by mouth daily.  ? [DISCONTINUED] ezetimibe (ZETIA) 10 MG tablet Take 1 tablet (10 mg total) by mouth daily.  ? [DISCONTINUED] levothyroxine (SYNTHROID) 75 MCG tablet Take 1 tablet (75 mcg total) by mouth daily.  ? [DISCONTINUED] lisinopril (ZESTRIL) 40 MG tablet TAKE 1 TABLET (40 MG TOTAL) BY MOUTH DAILY.  ? [DISCONTINUED] metFORMIN (GLUCOPHAGE) 850 MG  tablet TAKE 1 TABLET BY MOUTH TWICE DAILY  ? [DISCONTINUED] metoprolol succinate (TOPROL-XL) 50 MG 24 hr tablet TAKE 1 TABLET BY MOUTH DAILY. TAKE WITH OR IMMEDIATELY FOLLOWING A MEAL.  ? [DISCONTINUED] pantoprazole (PROTONIX) 40 MG tablet Take 1 tablet (40 mg total) by mouth daily.  ? [DISCONTINUED] valACYclovir (VALTREX) 1000 MG tablet take 1 to 2 tablets by mouth every 12 hours  for 2 to 3 days as needed for fever blisters  ? ?Allergies  ?Allergen Reactions  ? Other Other (See Comments)  ?  Rybelsus ab pain at 3 mg, 7 mg n/v  ? Pravachol [Pravastatin Sodium] Other (See Comments)  ?  Muscle aches  ? ?Recent Results (from the past 2160 hour(s))  ?CUP PACEART REMOTE DEVICE CHECK     Status: None  ? Collection Time: 03/24/21 11:02 PM  ?Result Value Ref Range  ? Date Time Interrogation Session [phone]   ? Pulse Insurance claims handler MERM   ? Pulse Gen Model [id number] LINQ II   ? Pulse Gen Serial Number [id number]   ? Clinic Name [facility]   ? Implantable Pulse Generator Type ICM/ILR   ? Implantable Pulse Generator Implant Date [id number]   ? Eval Rhythm SR at 60 bpm   ?CUP PACEART REMOTE DEVICE CHECK     Status: None  ? Collection Time: 04/30/21 11:07 PM  ?Result Value Ref Range  ? Date Time Interrogation Session [phone]   ? Pulse Insurance claims handler MERM   ? Pulse Gen Model [id number] LINQ II   ? Pulse Gen Serial Number [id number]   ? Clinic Name [facility]   ? Implantable Pulse Generator Type ICM/ILR   ? Implantable Pulse Generator Implant Date [id number]   ?CUP PACEART REMOTE DEVICE CHECK     Status: None  ? Collection Time: 06/02/21 11:03 PM  ?Result Value Ref Range  ? Date Time Interrogation Session [phone]   ? Pulse Insurance claims handler MERM   ? Pulse Gen Model [id number] LINQ II   ? Pulse Gen Serial Number [id number]   ? Clinic Name [facility]   ? Implantable Pulse Generator Type ICM/ILR   ? Implantable Pulse Generator Implant Date [id number]   ? ?Objective  ?Body mass index is 30.62  kg/m?. ?Wt Readings from Last 3 Encounters:  ?06/20/21 232 lb 1.9 oz (105.3 kg)  ?01/18/21 237 lb 11.2 oz (107.8 kg)  ?01/04/21 237 lb 14.4 oz (107.9 kg)  ? ?Temp Readings from Last 3 Encounters:  ?06/20/21 (!) 97.5 ?F (36.

## 2021-06-21 ENCOUNTER — Other Ambulatory Visit: Payer: Self-pay

## 2021-06-21 ENCOUNTER — Encounter: Payer: Self-pay | Admitting: Internal Medicine

## 2021-06-21 DIAGNOSIS — R4701 Aphasia: Secondary | ICD-10-CM | POA: Insufficient documentation

## 2021-06-21 MED ORDER — MOUNJARO 7.5 MG/0.5ML ~~LOC~~ SOAJ
SUBCUTANEOUS | 6 refills | Status: DC
Start: 2021-06-21 — End: 2021-12-21
  Filled 2021-06-21: qty 2, 28d supply, fill #0

## 2021-06-23 ENCOUNTER — Other Ambulatory Visit: Payer: Self-pay

## 2021-06-28 ENCOUNTER — Other Ambulatory Visit: Payer: Self-pay

## 2021-06-28 MED ORDER — MOUNJARO 5 MG/0.5ML ~~LOC~~ SOAJ
SUBCUTANEOUS | 12 refills | Status: DC
  Filled 2021-06-28: qty 2, 28d supply, fill #0

## 2021-06-28 NOTE — Progress Notes (Signed)
Faxed via epic routing

## 2021-06-30 ENCOUNTER — Other Ambulatory Visit: Payer: Self-pay

## 2021-07-10 ENCOUNTER — Ambulatory Visit (INDEPENDENT_AMBULATORY_CARE_PROVIDER_SITE_OTHER): Payer: 59

## 2021-07-10 DIAGNOSIS — I6340 Cerebral infarction due to embolism of unspecified cerebral artery: Secondary | ICD-10-CM | POA: Diagnosis not present

## 2021-07-11 ENCOUNTER — Other Ambulatory Visit: Payer: Self-pay

## 2021-07-11 LAB — CUP PACEART REMOTE DEVICE CHECK
Date Time Interrogation Session: 20230405230615
Implantable Pulse Generator Implant Date: 20210503

## 2021-07-25 ENCOUNTER — Other Ambulatory Visit: Payer: Self-pay

## 2021-07-25 NOTE — Progress Notes (Signed)
Carelink Summary Report / Loop Recorder 

## 2021-07-28 ENCOUNTER — Other Ambulatory Visit: Payer: Self-pay

## 2021-07-31 DIAGNOSIS — L570 Actinic keratosis: Secondary | ICD-10-CM | POA: Diagnosis not present

## 2021-07-31 DIAGNOSIS — A630 Anogenital (venereal) warts: Secondary | ICD-10-CM | POA: Diagnosis not present

## 2021-07-31 DIAGNOSIS — L821 Other seborrheic keratosis: Secondary | ICD-10-CM | POA: Diagnosis not present

## 2021-07-31 DIAGNOSIS — L578 Other skin changes due to chronic exposure to nonionizing radiation: Secondary | ICD-10-CM | POA: Diagnosis not present

## 2021-08-01 IMAGING — CT CT HEAD W/O CM
3 of 4 series · 13 of 47 positions shown, 15 images · non-contrast
Comparison: Brain MRI 08/01/2019

CLINICAL DATA: Stroke follow-up

EXAM:
CT HEAD WITHOUT CONTRAST
TECHNIQUE: Contiguous axial images were obtained from the base of the skull
through the vertex without intravenous contrast.

[Series 3: head without · axial · non-contrast · 0.45mm/px · z∈[-131,+9]mm · 7 of 38 slices shown, 9 images]
[im 5/38  brain]
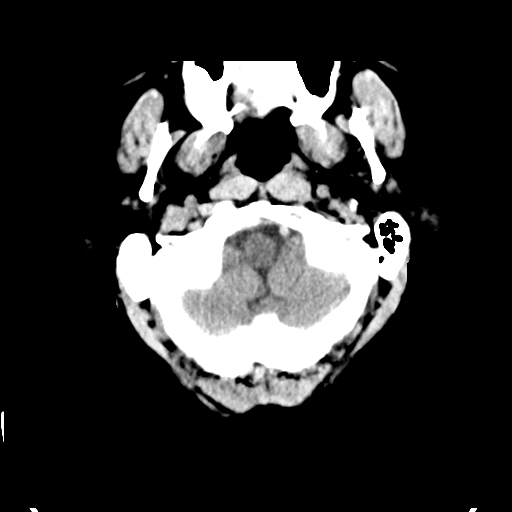
[im 5/38  bone]
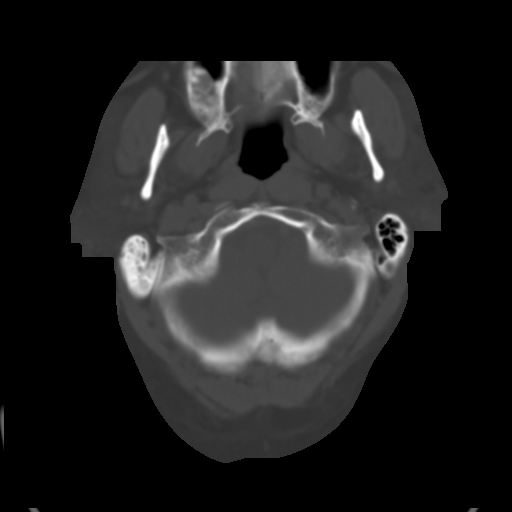
[im 10/38  brain]
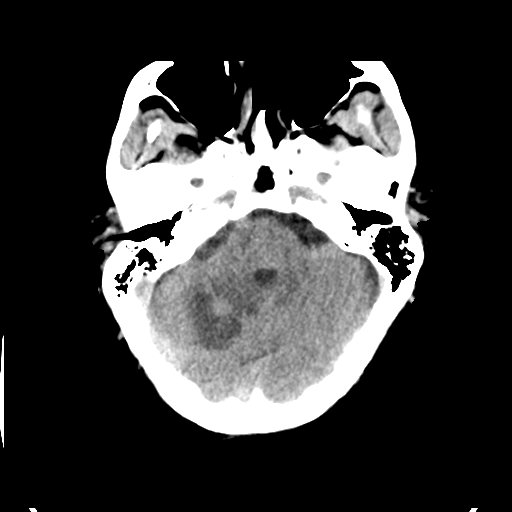
[im 14/38  brain]
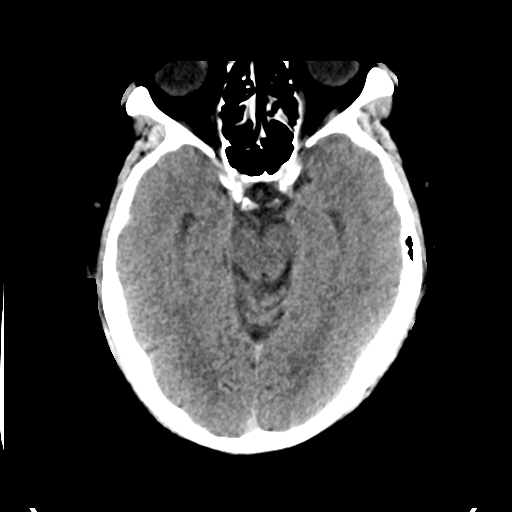
[im 19/38  brain]
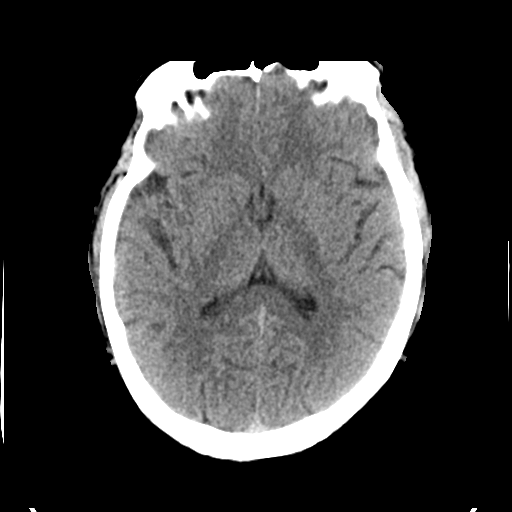
[im 24/38  brain]
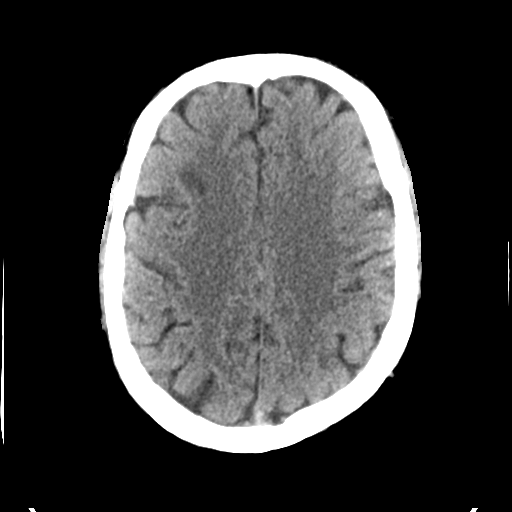
[im 24/38  bone]
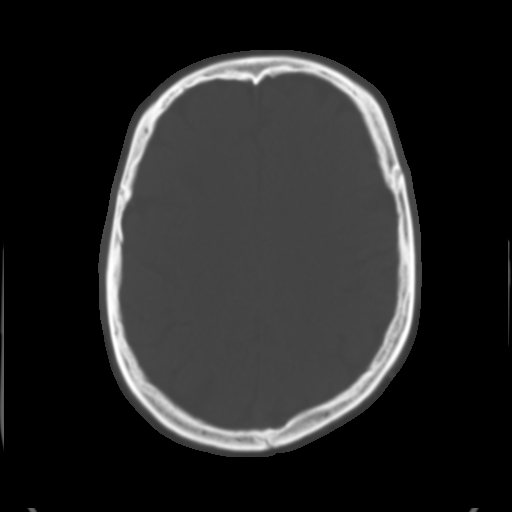
[im 28/38  brain]
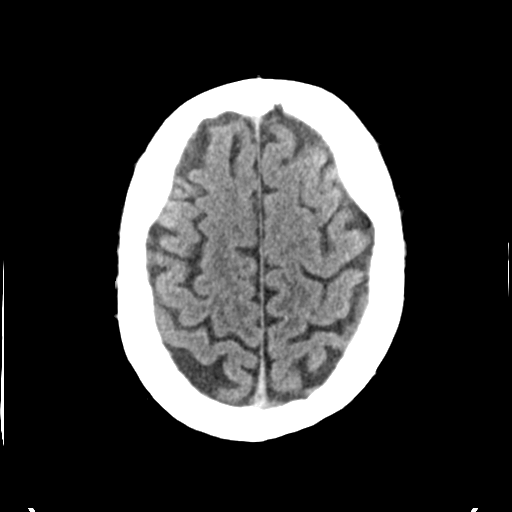
[im 33/38  brain]
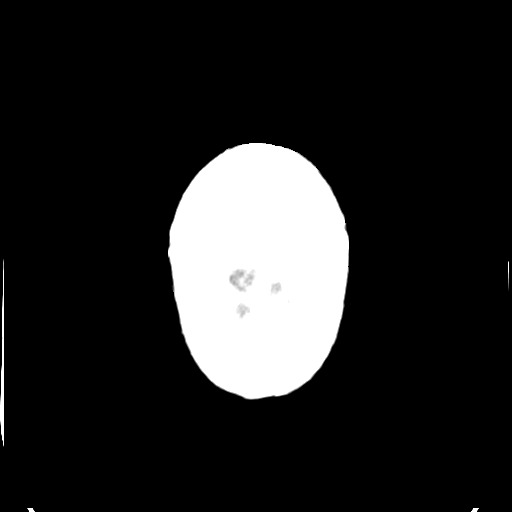

[Series 5: head without cor · coronal · non-contrast · 0.36mm/px · 3 of 73 slices shown]
[im 25/73  brain]
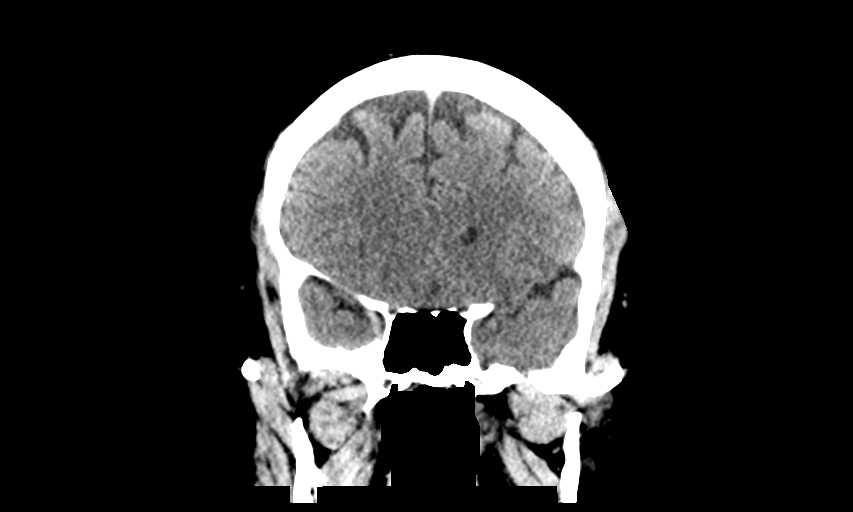
[im 33/73  brain]
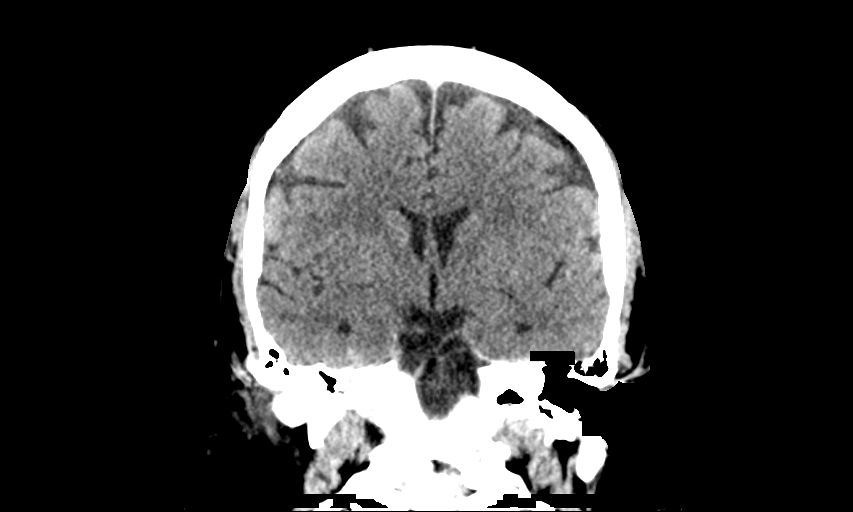
[im 41/73  brain]
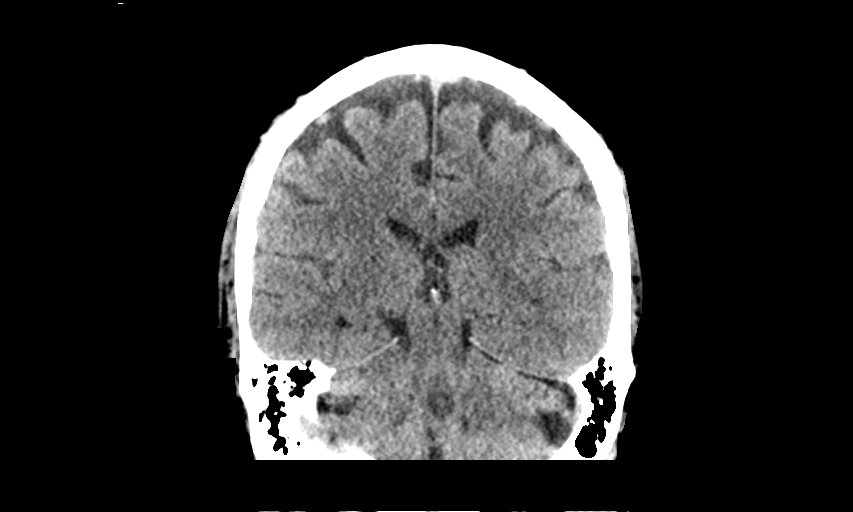

[Series 6: head without sag · sagittal · non-contrast · 0.40mm/px · 3 of 67 slices shown]
[im 23/67  brain]
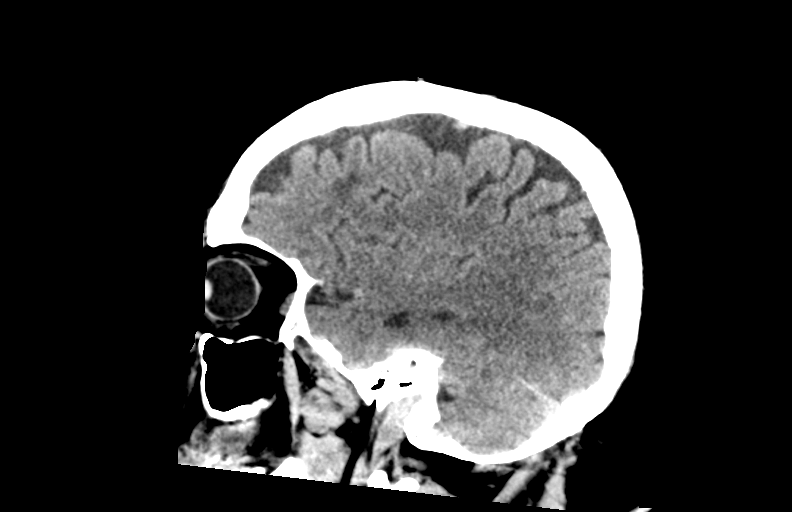
[im 34/67  brain]
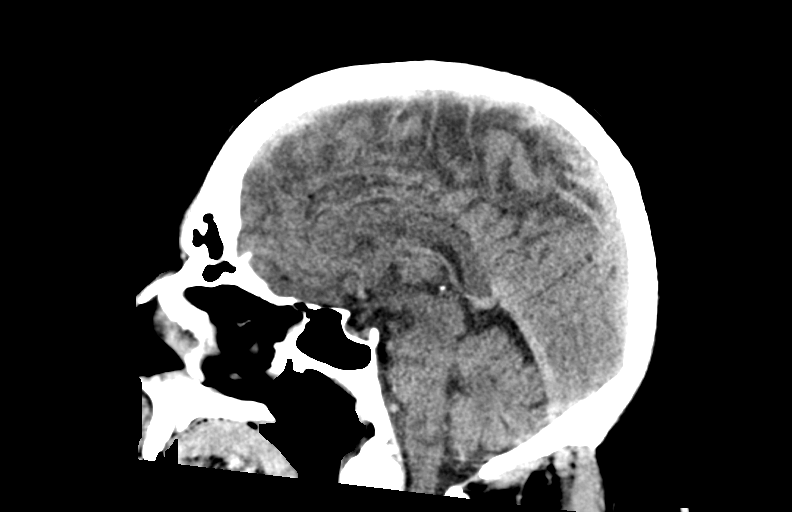
[im 45/67  brain]
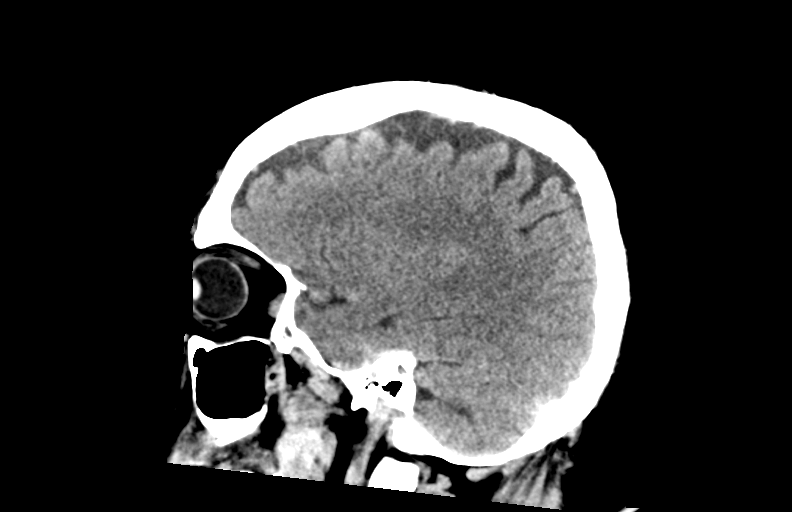

[13 of 47 positions shown; findings below may reference images not displayed]

FINDINGS: Brain: Small area of central hemorrhage within the right cerebellar
infarct is unchanged compared to the earlier MRI. There is no new
site of hemorrhage or ischemia. There is mild mass effect within the
posterior fossa but no hydrocephalus.

Vascular: No abnormal hyperdensity of the major intracranial
arteries or dural venous sinuses. No intracranial atherosclerosis.

Skull: The visualized skull base, calvarium and extracranial soft
tissues are normal.

Sinuses/Orbits: No fluid levels or advanced mucosal thickening of
the visualized paranasal sinuses. No mastoid or middle ear effusion.
The orbits are normal.
IMPRESSION: Unchanged small area of central hemorrhage within the right
cerebellar infarct. No new site of hemorrhage or ischemia.

## 2021-08-07 ENCOUNTER — Other Ambulatory Visit: Payer: Self-pay

## 2021-08-08 ENCOUNTER — Other Ambulatory Visit: Payer: Self-pay

## 2021-08-14 ENCOUNTER — Ambulatory Visit: Payer: 59

## 2021-08-14 LAB — CUP PACEART REMOTE DEVICE CHECK
Date Time Interrogation Session: 20230510231217
Implantable Pulse Generator Implant Date: 20210503

## 2021-08-17 DIAGNOSIS — E785 Hyperlipidemia, unspecified: Secondary | ICD-10-CM | POA: Diagnosis not present

## 2021-08-17 DIAGNOSIS — E559 Vitamin D deficiency, unspecified: Secondary | ICD-10-CM | POA: Diagnosis not present

## 2021-08-17 DIAGNOSIS — E1169 Type 2 diabetes mellitus with other specified complication: Secondary | ICD-10-CM | POA: Diagnosis not present

## 2021-08-17 DIAGNOSIS — E039 Hypothyroidism, unspecified: Secondary | ICD-10-CM | POA: Diagnosis not present

## 2021-08-17 DIAGNOSIS — I152 Hypertension secondary to endocrine disorders: Secondary | ICD-10-CM | POA: Diagnosis not present

## 2021-08-17 DIAGNOSIS — Z79899 Other long term (current) drug therapy: Secondary | ICD-10-CM | POA: Diagnosis not present

## 2021-08-17 DIAGNOSIS — E1159 Type 2 diabetes mellitus with other circulatory complications: Secondary | ICD-10-CM | POA: Diagnosis not present

## 2021-08-30 ENCOUNTER — Other Ambulatory Visit: Payer: Self-pay

## 2021-08-31 ENCOUNTER — Other Ambulatory Visit: Payer: Self-pay

## 2021-09-05 ENCOUNTER — Other Ambulatory Visit: Payer: Self-pay

## 2021-09-06 ENCOUNTER — Other Ambulatory Visit: Payer: Self-pay

## 2021-09-07 ENCOUNTER — Other Ambulatory Visit: Payer: Self-pay

## 2021-09-08 ENCOUNTER — Other Ambulatory Visit: Payer: Self-pay

## 2021-09-18 ENCOUNTER — Ambulatory Visit (INDEPENDENT_AMBULATORY_CARE_PROVIDER_SITE_OTHER): Payer: 59

## 2021-09-18 DIAGNOSIS — I6340 Cerebral infarction due to embolism of unspecified cerebral artery: Secondary | ICD-10-CM

## 2021-09-18 DIAGNOSIS — E291 Testicular hypofunction: Secondary | ICD-10-CM | POA: Diagnosis not present

## 2021-09-19 LAB — CUP PACEART REMOTE DEVICE CHECK
Date Time Interrogation Session: 20230612230115
Implantable Pulse Generator Implant Date: 20210503

## 2021-09-25 DIAGNOSIS — N5201 Erectile dysfunction due to arterial insufficiency: Secondary | ICD-10-CM | POA: Diagnosis not present

## 2021-09-25 DIAGNOSIS — E291 Testicular hypofunction: Secondary | ICD-10-CM | POA: Diagnosis not present

## 2021-10-05 NOTE — Progress Notes (Signed)
Carelink Summary Report / Loop Recorder 

## 2021-10-06 ENCOUNTER — Other Ambulatory Visit: Payer: Self-pay

## 2021-10-06 MED ORDER — GLIMEPIRIDE 2 MG PO TABS
ORAL_TABLET | ORAL | 3 refills | Status: DC
  Filled 2021-10-06 – 2021-12-12 (×2): qty 180, 90d supply, fill #0

## 2021-10-09 ENCOUNTER — Other Ambulatory Visit: Payer: Self-pay

## 2021-10-11 ENCOUNTER — Other Ambulatory Visit: Payer: Self-pay

## 2021-10-11 DIAGNOSIS — I236 Thrombosis of atrium, auricular appendage, and ventricle as current complications following acute myocardial infarction: Secondary | ICD-10-CM | POA: Diagnosis not present

## 2021-10-11 DIAGNOSIS — I240 Acute coronary thrombosis not resulting in myocardial infarction: Secondary | ICD-10-CM | POA: Diagnosis not present

## 2021-10-11 DIAGNOSIS — Z8673 Personal history of transient ischemic attack (TIA), and cerebral infarction without residual deficits: Secondary | ICD-10-CM | POA: Diagnosis not present

## 2021-10-22 LAB — CUP PACEART REMOTE DEVICE CHECK
Date Time Interrogation Session: 20230715230102
Implantable Pulse Generator Implant Date: 20210503

## 2021-10-23 ENCOUNTER — Ambulatory Visit (INDEPENDENT_AMBULATORY_CARE_PROVIDER_SITE_OTHER): Payer: 59

## 2021-10-23 ENCOUNTER — Other Ambulatory Visit: Payer: Self-pay

## 2021-10-23 DIAGNOSIS — I6340 Cerebral infarction due to embolism of unspecified cerebral artery: Secondary | ICD-10-CM | POA: Diagnosis not present

## 2021-10-26 DIAGNOSIS — I240 Acute coronary thrombosis not resulting in myocardial infarction: Secondary | ICD-10-CM | POA: Diagnosis not present

## 2021-10-30 ENCOUNTER — Other Ambulatory Visit: Payer: Self-pay

## 2021-11-13 ENCOUNTER — Other Ambulatory Visit: Payer: Self-pay

## 2021-11-13 DIAGNOSIS — H9193 Unspecified hearing loss, bilateral: Secondary | ICD-10-CM | POA: Diagnosis not present

## 2021-11-13 DIAGNOSIS — Z8673 Personal history of transient ischemic attack (TIA), and cerebral infarction without residual deficits: Secondary | ICD-10-CM | POA: Diagnosis not present

## 2021-11-13 DIAGNOSIS — N529 Male erectile dysfunction, unspecified: Secondary | ICD-10-CM | POA: Diagnosis not present

## 2021-11-22 NOTE — Progress Notes (Signed)
Carelink Summary Report / Loop Recorder 

## 2021-11-27 ENCOUNTER — Other Ambulatory Visit: Payer: Self-pay

## 2021-12-05 ENCOUNTER — Other Ambulatory Visit: Payer: Self-pay

## 2021-12-12 ENCOUNTER — Other Ambulatory Visit: Payer: Self-pay

## 2021-12-12 DIAGNOSIS — K5900 Constipation, unspecified: Secondary | ICD-10-CM | POA: Diagnosis not present

## 2021-12-13 ENCOUNTER — Other Ambulatory Visit: Payer: Self-pay

## 2021-12-18 ENCOUNTER — Other Ambulatory Visit
Admission: RE | Admit: 2021-12-18 | Discharge: 2021-12-18 | Disposition: A | Discharge status: Home / Self Care | Payer: 59 | Attending: Unknown Physician Specialty | Admitting: Unknown Physician Specialty

## 2021-12-18 DIAGNOSIS — K5900 Constipation, unspecified: Secondary | ICD-10-CM | POA: Insufficient documentation

## 2021-12-19 LAB — THYROID PANEL WITH TSH
Free Thyroxine Index: 2.6 (ref 1.2–4.9)
T3 Uptake Ratio: 30 % (ref 24–39)
T4, Total: 8.8 ug/dL (ref 4.5–12.0)
TSH: 2.33 u[IU]/mL (ref 0.450–4.500)

## 2021-12-20 ENCOUNTER — Ambulatory Visit (INDEPENDENT_AMBULATORY_CARE_PROVIDER_SITE_OTHER): Payer: 59

## 2021-12-20 DIAGNOSIS — Z79899 Other long term (current) drug therapy: Secondary | ICD-10-CM | POA: Diagnosis not present

## 2021-12-20 DIAGNOSIS — I152 Hypertension secondary to endocrine disorders: Secondary | ICD-10-CM | POA: Diagnosis not present

## 2021-12-20 DIAGNOSIS — E785 Hyperlipidemia, unspecified: Secondary | ICD-10-CM | POA: Diagnosis not present

## 2021-12-20 DIAGNOSIS — I6340 Cerebral infarction due to embolism of unspecified cerebral artery: Secondary | ICD-10-CM | POA: Diagnosis not present

## 2021-12-20 DIAGNOSIS — E1169 Type 2 diabetes mellitus with other specified complication: Secondary | ICD-10-CM | POA: Diagnosis not present

## 2021-12-20 DIAGNOSIS — E559 Vitamin D deficiency, unspecified: Secondary | ICD-10-CM | POA: Diagnosis not present

## 2021-12-20 DIAGNOSIS — E039 Hypothyroidism, unspecified: Secondary | ICD-10-CM | POA: Diagnosis not present

## 2021-12-20 DIAGNOSIS — E1159 Type 2 diabetes mellitus with other circulatory complications: Secondary | ICD-10-CM | POA: Diagnosis not present

## 2021-12-20 LAB — BASIC METABOLIC PANEL
BUN: 19 (ref 4–21)
CO2: 22 (ref 13–22)
Chloride: 108 (ref 99–108)
Creatinine: 1.2 (ref 0.6–1.3)
Glucose: 176
Potassium: 4.6 mEq/L (ref 3.5–5.1)
Sodium: 138 (ref 137–147)

## 2021-12-20 LAB — HEMOGLOBIN A1C: Hemoglobin A1C: 7.8

## 2021-12-20 LAB — HEPATIC FUNCTION PANEL
ALT: 25 U/L (ref 10–40)
AST: 21 (ref 14–40)
Alkaline Phosphatase: 87 (ref 25–125)
Bilirubin, Total: 0.6

## 2021-12-20 LAB — CUP PACEART REMOTE DEVICE CHECK
Date Time Interrogation Session: 20230919230313
Implantable Pulse Generator Implant Date: 20210503

## 2021-12-20 LAB — COMPREHENSIVE METABOLIC PANEL
Calcium: 9.3 (ref 8.7–10.7)
eGFR: 61

## 2021-12-21 ENCOUNTER — Other Ambulatory Visit: Payer: Self-pay

## 2021-12-21 ENCOUNTER — Ambulatory Visit (INDEPENDENT_AMBULATORY_CARE_PROVIDER_SITE_OTHER): Payer: 59 | Admitting: Internal Medicine

## 2021-12-21 ENCOUNTER — Encounter: Payer: Self-pay | Admitting: Internal Medicine

## 2021-12-21 VITALS — BP 120/64 | HR 56 | Temp 97.6°F | Ht 73.0 in | Wt 233.8 lb

## 2021-12-21 DIAGNOSIS — E538 Deficiency of other specified B group vitamins: Secondary | ICD-10-CM

## 2021-12-21 DIAGNOSIS — Z0000 Encounter for general adult medical examination without abnormal findings: Secondary | ICD-10-CM

## 2021-12-21 DIAGNOSIS — E1165 Type 2 diabetes mellitus with hyperglycemia: Secondary | ICD-10-CM

## 2021-12-21 DIAGNOSIS — I152 Hypertension secondary to endocrine disorders: Secondary | ICD-10-CM

## 2021-12-21 DIAGNOSIS — M6208 Separation of muscle (nontraumatic), other site: Secondary | ICD-10-CM

## 2021-12-21 DIAGNOSIS — E1159 Type 2 diabetes mellitus with other circulatory complications: Secondary | ICD-10-CM | POA: Diagnosis not present

## 2021-12-21 DIAGNOSIS — I10 Essential (primary) hypertension: Secondary | ICD-10-CM | POA: Diagnosis not present

## 2021-12-21 MED ORDER — EZETIMIBE 10 MG PO TABS
10.0000 mg | ORAL_TABLET | Freq: Every day | ORAL | 3 refills | Status: DC
  Filled 2021-12-21 – 2022-01-29 (×2): qty 90, 90d supply, fill #0
  Filled 2022-04-23: qty 90, 90d supply, fill #1
  Filled 2022-07-23: qty 90, 90d supply, fill #2
  Filled 2022-10-29: qty 90, 90d supply, fill #3

## 2021-12-21 MED ORDER — METFORMIN HCL 850 MG PO TABS
ORAL_TABLET | Freq: Two times a day (BID) | ORAL | 3 refills | Status: DC
  Filled 2021-12-21: qty 180, fill #0
  Filled 2022-03-05: qty 180, 90d supply, fill #0
  Filled 2022-06-04: qty 180, 90d supply, fill #1
  Filled 2022-09-10: qty 180, 90d supply, fill #2
  Filled 2022-12-17: qty 180, 90d supply, fill #3

## 2021-12-21 MED ORDER — DAPAGLIFLOZIN PROPANEDIOL 10 MG PO TABS
10.0000 mg | ORAL_TABLET | Freq: Every day | ORAL | 3 refills | Status: DC
  Filled 2021-12-21 – 2022-03-05 (×2): qty 90, 90d supply, fill #0
  Filled 2022-06-04: qty 30, 30d supply, fill #1
  Filled 2022-06-07 – 2022-07-02 (×2): qty 30, 30d supply, fill #2
  Filled 2022-08-06: qty 30, 30d supply, fill #3
  Filled 2022-08-28: qty 30, 30d supply, fill #4
  Filled 2022-10-02: qty 30, 30d supply, fill #5
  Filled 2022-10-29: qty 30, 30d supply, fill #6
  Filled 2022-12-06 (×2): qty 30, 30d supply, fill #7

## 2021-12-21 MED ORDER — VALACYCLOVIR HCL 1 G PO TABS
ORAL_TABLET | ORAL | 3 refills | Status: DC
  Filled 2021-12-21: qty 90, fill #0

## 2021-12-21 MED ORDER — "SYRINGE/NEEDLE (DISP) 25G X 1-1/2"" 1 ML MISC"
1.0000 | 3 refills | Status: DC
  Filled 2021-12-21: qty 12, fill #0

## 2021-12-21 MED ORDER — CYANOCOBALAMIN 1000 MCG/ML IJ SOLN
1000.0000 ug | Freq: Once | INTRAMUSCULAR | Status: AC
  Administered 2021-12-21: 1000 ug via INTRAMUSCULAR

## 2021-12-21 MED ORDER — ATORVASTATIN CALCIUM 80 MG PO TABS
80.0000 mg | ORAL_TABLET | Freq: Every day | ORAL | 3 refills | Status: DC
  Filled 2021-12-21 – 2022-01-29 (×2): qty 90, 90d supply, fill #0
  Filled 2022-04-23: qty 90, 90d supply, fill #1
  Filled 2022-07-23: qty 90, 90d supply, fill #2
  Filled 2022-10-22: qty 90, 90d supply, fill #3

## 2021-12-21 MED ORDER — CYANOCOBALAMIN 1000 MCG/ML IJ SOLN
1000.0000 ug | INTRAMUSCULAR | 2 refills | Status: DC
  Filled 2021-12-21: qty 3, 90d supply, fill #0

## 2021-12-21 MED ORDER — GLIMEPIRIDE 2 MG PO TABS
ORAL_TABLET | ORAL | 3 refills | Status: DC
  Filled 2021-12-21: qty 180, fill #0

## 2021-12-21 MED ORDER — LEVOTHYROXINE SODIUM 75 MCG PO TABS
75.0000 ug | ORAL_TABLET | Freq: Every day | ORAL | 3 refills | Status: DC
  Filled 2021-12-21 – 2022-03-05 (×2): qty 90, 90d supply, fill #0
  Filled 2022-06-04: qty 30, 30d supply, fill #1
  Filled 2022-07-02: qty 30, 30d supply, fill #2
  Filled 2022-08-06: qty 30, 30d supply, fill #3
  Filled 2022-08-28: qty 30, 30d supply, fill #4
  Filled 2022-10-08: qty 30, 30d supply, fill #5
  Filled 2022-10-29: qty 30, 30d supply, fill #6
  Filled 2022-12-06 (×2): qty 30, 30d supply, fill #7

## 2021-12-21 MED ORDER — METOPROLOL SUCCINATE ER 50 MG PO TB24
ORAL_TABLET | Freq: Every day | ORAL | 3 refills | Status: DC
  Filled 2021-12-21: qty 90, fill #0
  Filled 2022-02-26: qty 90, 90d supply, fill #0
  Filled 2022-05-29: qty 90, 90d supply, fill #1
  Filled 2022-08-28: qty 90, 90d supply, fill #2
  Filled 2022-11-28: qty 90, 90d supply, fill #3

## 2021-12-21 MED ORDER — PANTOPRAZOLE SODIUM 40 MG PO TBEC
40.0000 mg | DELAYED_RELEASE_TABLET | Freq: Every day | ORAL | 3 refills | Status: DC
  Filled 2021-12-21 – 2022-03-05 (×2): qty 90, 90d supply, fill #0
  Filled 2022-06-04: qty 90, 90d supply, fill #1
  Filled 2022-09-04: qty 90, 90d supply, fill #2
  Filled 2022-12-06 (×2): qty 90, 90d supply, fill #3

## 2021-12-21 MED ORDER — LISINOPRIL 40 MG PO TABS
ORAL_TABLET | Freq: Every day | ORAL | 3 refills | Status: DC
  Filled 2021-12-21: qty 90, fill #0
  Filled 2022-01-29: qty 90, 90d supply, fill #0
  Filled 2022-05-01: qty 90, 90d supply, fill #1
  Filled 2022-07-23: qty 90, 90d supply, fill #2
  Filled 2022-10-29: qty 90, 90d supply, fill #3

## 2021-12-21 NOTE — Patient Instructions (Addendum)
Zoster Vaccine, Recombinant injection- x 2 doses   What is this medication? ZOSTER VACCINE (ZOS ter vak SEEN) is a vaccine used to reduce the risk of getting shingles. This vaccine is not used to treat shingles or nerve pain from shingles. This medicine may be used for other purposes; ask your health care provider or pharmacist if you have questions. COMMON BRAND NAME(S): [facility] What should I tell my care team before I take this medication? They need to know if you have any of these conditions: cancer immune system problems an unusual or allergic reaction to Zoster vaccine, other medications, foods, dyes, or preservatives pregnant or trying to get pregnant breast-feeding How should I use this medication? This vaccine is injected into a muscle. It is given by a health care provider. A copy of Vaccine Information Statements will be given before each vaccination. Be sure to read this information carefully each time. This sheet may change often. Talk to your health care provider about the use of this vaccine in children. This vaccine is not approved for use in children. Overdosage: If you think you have taken too much of this medicine contact a poison control center or emergency room at once. NOTE: This medicine is only for you. Do not share this medicine with others. What if I miss a dose? Keep appointments for follow-up (booster) doses. It is important not to miss your dose. Call your health care provider if you are unable to keep an appointment. What may interact with this medication? medicines that suppress your immune system medicines to treat cancer steroid medicines like prednisone or cortisone This list may not describe all possible interactions. Give your health care provider a list of all the medicines, herbs, non-prescription drugs, or dietary supplements you use. Also tell them if you smoke, drink alcohol, or use illegal drugs. Some items may interact with your medicine. What  should I watch for while using this medication? Visit your health care provider regularly. This vaccine, like all vaccines, may not fully protect everyone. What side effects may I notice from receiving this medication? Side effects that you should report to your doctor or health care professional as soon as possible: allergic reactions (skin rash, itching or hives; swelling of the face, lips, or tongue) trouble breathing Side effects that usually do not require medical attention (report these to your doctor or health care professional if they continue or are bothersome): chills headache fever nausea pain, redness, or irritation at site where injected tiredness vomiting This list may not describe all possible side effects. Call your doctor for medical advice about side effects. You may report side effects to FDA at 1-800-FDA-1088. Where should I keep my medication? This vaccine is only given by a health care provider. It will not be stored at home. NOTE: This sheet is a summary. It may not cover all possible information. If you have questions about this medicine, talk to your doctor, pharmacist, or health care provider.  2023 Elsevier/Gold Standard (2021-02-17 00:00:00)    Diastasis Recti  Diastasis recti is a condition in which the muscles of the abdomen (rectus abdominis muscles) become thin and separate. The result is a wider space between the muscles of the right and left abdomen (abdominal muscles). This wider space between the muscles may cause a bulge in the middle of the abdomen. This bulge may be noticed when a person is straining or when he or she sits up after lying down. Diastasis recti can affect men and women. It is  most common among pregnant women, babies, people with obesity, and people who have had abdominal surgery. Exercise or surgery may help correct this condition. What are the causes? Common causes of this condition include: Pregnancy. As the uterus grows in size,  it puts pressure on the abdominal muscles, causing the muscles to separate. Obesity. Excess fat puts pressure on abdominal muscles. Weight lifting. Some exercises of the abdomen. Advanced age. Genetics. Having had surgery on the abdomen before. What increases the risk? This condition is more likely to develop in: Women. Newborns, especially newborns who are born early (prematurely). What are the signs or symptoms? Common symptoms of this condition include: A bulge in the middle of your abdomen. You will notice it most when you sit up or strain. Pain in your low back, hips, or the area between your hip bones (pelvis). Constipation. Being unable to control when you urinate (urinary incontinence). Bloating. Poor posture. How is this diagnosed? This condition is diagnosed with a physical exam. During the exam, your health care provider will ask you to lie flat on your back and do a crunch or half sit-up. If you have diastasis recti, a bulge will appear lengthwise between your abdominal muscles in the center of your abdomen. Your health care provider will measure the gap between your muscles with one of the following: A medical device used to measure the space between two objects (caliper). A tape measure. CT scan. Ultrasound. Finger spaces. Your health care provider will measure the space using his or her fingers. How is this treated? If your muscle separation is not too large, you may not need treatment. However, if you are a woman who plans to become pregnant again, you should treat this condition before your next pregnancy. Treatment may include: Physical therapy exercises to strengthen and tighten your abdominal muscles. Lifestyle changes such as weight loss and exercise. Over-the-counter pain medicines as needed. Surgery to correct the separation. Follow these instructions at home: Activity Return to your normal activities as told by your health care provider. Ask your health care  provider what activities are safe for you. Do exercises as told by your health care provider. Make sure you are doing your exercises and movements correctly when lifting weights or doing exercises using your abdominal muscles or the muscles in the center of your body that give stability (core muscles). Proper form can help to prevent this condition from happening again. General instructions If you are overweight, ask your health care provider for help with weight loss. Losing even a small amount of weight can help to improve your diastasis recti. Take over-the-counter or prescription medicines only as told by your health care provider. Do not strain. Straining can make the separation worse. Examples of straining include: Pushing hard to have a bowel movement, such as when you have constipation. Lifting heavy objects or lifting children. Standing up and sitting down. You may need to take these actions to prevent or treat constipation: Drink enough fluid to keep your urine pale yellow. Take over-the-counter or prescription medicines. Eat foods that are high in fiber, such as beans, whole grains, and fresh fruits and vegetables. Limit foods that are high in fat and processed sugars, such as fried or sweet foods. Keep all follow-up visits. This is important. Contact a health care provider if: You notice a new bulge in your abdomen. Get help right away if: You experience severe discomfort in your abdomen. You develop severe abdominal pain along with nausea, vomiting, or a fever. Summary  Diastasis recti is a condition in which the muscles of the abdomen (rectus abdominismuscles) become thin and separate. You may notice a bulge in your abdomen because the space has widened between the muscles of the right and left abdomen. The most common symptom is a bulge in the middle of your abdomen. You will notice it most when you sit up or strain. This condition is diagnosed with a physical exam. If the  muscle separation is not too big, you may not need treatment. Otherwise, you may need to do physical therapy or have surgery. This information is not intended to replace advice given to you by your health care provider. Make sure you discuss any questions you have with your health care provider. Document Revised: 11/20/2019 Document Reviewed: 11/20/2019 Elsevier Patient Education  [place].

## 2021-12-21 NOTE — Progress Notes (Signed)
Chief Complaint  Patient presents with   Annual Exam   Annual  Dm 2 with htn a1c 12/20/21 7.8 and BP controlled with h/o 3 strokes f/u cardiology Dr. Clayborn Bigness and neurology Dr. Manuella Ghazi  He had to stop mounjaro he did lose 8 lbs but had n/v, irregular bowel habits I.e constipation/diarrhea he saw GI Dr.Butler in [place] 2 weeks ago and he stated to increase his fiber and stop GLP medicaiton  On amaryl 2 mg in am and '1mg'$  supper  Metformin 850 mg bid Farxiga 10 mg qd  Lis 40, lipitor 80 and zetia 10   Hypothyroidism on levo 75 mcg qd TSH normal 9/202/23  [facility] following for this and DM 2   B12 def 168 labs 12/20/21 endocrine   Review of Systems  Constitutional:  Negative for weight loss.  HENT:  Negative for hearing loss.   Eyes:  Negative for blurred vision.  Respiratory:  Negative for shortness of breath.   Cardiovascular:  Negative for chest pain.  Gastrointestinal:  Negative for abdominal pain and blood in stool.  Genitourinary:  Negative for dysuria.  Musculoskeletal:  Negative for falls and joint pain.  Skin:  Negative for rash.  Neurological:  Negative for headaches.  Psychiatric/Behavioral:  Negative for depression.    Past Medical History:  Diagnosis Date   Alopecia 04/04/2011   Diabetes mellitus    Erectile dysfunction 04/04/2011   Heart attack ([place]) 10/07/2014   Overview:  Status post STEMI, anterior myocardial infarction status post DES stent x 2 to the LAD, mid and distal.    Heart murmur    Hyperlipidemia 04/04/2011   Hypogonadism male 04/04/2011   Myocardial infarction [facility])    Stroke ([place])    x 3 as of 07-03-21 last stroke 12/17/20 with aphasia   Substance abuse ([place])    Type II or unspecified type diabetes mellitus without mention of complication, uncontrolled 04/04/2011   Unspecified essential hypertension 04/04/2011   Past Surgical History:  Procedure Laterality Date   BUBBLE STUDY  08/03/2019   Procedure: BUBBLE STUDY;  Surgeon: Fay Records, MD;   Location[facility] ENDOSCOPY;  Service: Cardiovascular;;   CARDIAC CATHETERIZATION     IR ANGIO INTRA EXTRACRAN SEL COM CAROTID INNOMINATE UNI L MOD SED  12/17/2020   IR CT HEAD LTD  12/17/2020   IR PERCUTANEOUS ART THROMBECTOMY/INFUSION INTRACRANIAL INC DIAG ANGIO  12/17/2020   IR US GUIDE VASC ACCESS RIGHT  12/17/2020   LOOP RECORDER INSERTION N/A 08/03/2019   Procedure: LOOP RECORDER INSERTION;  Surgeon: Evans Lance, MD;  Location: [place] CV LAB;  Service: Cardiovascular;  Laterality: N/A;   RADIOLOGY WITH ANESTHESIA N/A 12/17/2020   Procedure: IR WITH ANESTHESIA;  Surgeon: Luanne Bras, MD;  Location: [place];  Service: Radiology;  Laterality: N/A;   TEE WITHOUT CARDIOVERSION N/A 08/03/2019   Procedure: TRANSESOPHAGEAL ECHOCARDIOGRAM (TEE);  Surgeon: Fay Records, MD;  Location: [facility] ENDOSCOPY;  Service: Cardiovascular;  Laterality: N/A;   Family History  Problem Relation Age of Onset   Hypertension Father    Dementia Father        died age 61 in 07/03/17   Memory loss Father    Emphysema Mother    COPD Mother    Thyroid disease Mother    Cancer Neg Hx    Diabetes Neg Hx    Heart disease Neg Hx    Stroke Neg Hx    Social History   Socioeconomic History   Marital status: Married  Spouse name: Not on file   Number of children: Not on file   Years of education: Not on file   Highest education level: Not on file  Occupational History   Occupation: OPERATIONS MANAGER of maintenance    Employer: [employer]  Tobacco Use   Smoking status: Never   Smokeless tobacco: Never  Vaping Use   Vaping Use: Never used  Substance and Sexual Activity   Alcohol use: No    Comment: former etoh abuse in 41s    Drug use: No   Sexual activity: Yes  Other Topics Concern   Not on file  Social History Narrative   Caffienated drinks-no   Seat belt use often-yes   Regular Exercise-no   Smoke alarm in the home-yes   Firearms/guns in the home-yes   History of physical abuse-no      Married  2 kids daughter and son    Oldest of siblings has 2 brothers and 1 sister    Never smoker    Works Physicist, medical and [place] wants to retire at 61 y.o. works in Education administrator       DPR wife Vickie             Social Determinants of Radio broadcast assistant Strain: Not on file  Food Insecurity: Not on file  Transportation Needs: Not on file  Physical Activity: Not on file  Stress: Not on file  Social Connections: Not on file  Intimate Partner Violence: Not on file   Current Meds  Medication Sig   apixaban (ELIQUIS) 5 MG TABS tablet Take 1 tablet (5 mg total) by mouth every 12 (twelve) hours   cyanocobalamin (VITAMIN B12) 1000 MCG/ML injection Inject 1 mL (1,000 mcg total) into the muscle every 30 (thirty) days.   Syringe/Needle, Disp, 25G X 1-1/2" 1 ML MISC 1 Device by Does not apply route every 30 (thirty) days.   [DISCONTINUED] atorvastatin (LIPITOR) 80 MG tablet Take 1 tablet (80 mg total) by mouth daily.   [DISCONTINUED] dapagliflozin propanediol (FARXIGA) 10 MG TABS tablet Take 1 tablet (10 mg total) by mouth daily.   [DISCONTINUED] ezetimibe (ZETIA) 10 MG tablet Take 1 tablet (10 mg total) by mouth daily.   [DISCONTINUED] glimepiride (AMARYL) 2 MG tablet TAKE 1 TABLET (2 MG TOTAL) BY MOUTH 2 (TWO) TIMES DAILY WITH A MEAL.   [DISCONTINUED] levothyroxine (SYNTHROID) 75 MCG tablet Take 1 tablet (75 mcg total) by mouth daily.   [DISCONTINUED] lisinopril (ZESTRIL) 40 MG tablet TAKE 1 TABLET (40 MG TOTAL) BY MOUTH DAILY.   [DISCONTINUED] metFORMIN (GLUCOPHAGE) 850 MG tablet TAKE 1 TABLET BY MOUTH TWICE DAILY   [DISCONTINUED] metoprolol succinate (TOPROL-XL) 50 MG 24 hr tablet TAKE 1 TABLET BY MOUTH DAILY. TAKE WITH OR IMMEDIATELY FOLLOWING A MEAL.   [DISCONTINUED] pantoprazole (PROTONIX) 40 MG tablet Take 1 tablet (40 mg total) by mouth daily. 30 min before food with lunch or dinner   [DISCONTINUED] valACYclovir (VALTREX) 1000 MG tablet take 1 to 2  tablets by mouth every 12 hours for 2 to 3 days as needed for fever blisters   Allergies  Allergen Reactions   Mounjaro [Tirzepatide]     Constipation/diarrhea    Other Other (See Comments)    Rybelsus ab pain at 3 mg, 7 mg n/v   Pravachol [Pravastatin Sodium] Other (See Comments)    Muscle aches   Recent Results (from the past 2160 hour(s))  CUP PACEART REMOTE DEVICE CHECK  Status: None   Collection Time: 10/14/21 11:01 PM  Result Value Ref Range   Date Time Interrogation Session [phone]    Pulse Generator Manufacturer MERM    Pulse Gen Model LNQ22 LINQ II    Pulse Gen Serial Number [id number]    Clinic Name [facility]    Implantable Pulse Generator Type ICM/ILR    Implantable Pulse Generator Implant Date [id number]    Eval Rhythm SR at 60 bpm   Thyroid Panel With TSH     Status: None   Collection Time: 12/18/21  2:58 PM  Result Value Ref Range   TSH 2.330 0.450 - 4.500 uIU/mL   T4, Total 8.8 4.5 - 12.0 ug/dL   T3 Uptake Ratio 30 24 - 39 %   Free Thyroxine Index 2.6 1.2 - 4.9    Comment: (NOTE) Performed At: [facility] [place], [place] [id number] Rush Farmer MD MH:[id number]   CUP PACEART REMOTE DEVICE CHECK     Status: None   Collection Time: 12/19/21 11:03 PM  Result Value Ref Range   Date Time Interrogation Session [phone]    Pulse Generator Manufacturer MERM    Pulse Gen Model LNQ22 LINQ II    Pulse Gen Serial Number [id number] [place] Clinic Name [facility]    Implantable Pulse Generator Type ICM/ILR    Implantable Pulse Generator Implant Date [id number]   Hemoglobin A1c     Status: None   Collection Time: 12/20/21 12:00 AM  Result Value Ref Range   Hemoglobin A1C 7.8    Objective  Body mass index is 30.85 kg/m. Wt Readings from Last 3 Encounters:  12/21/21 233 lb 12.8 oz (106.1 kg)  06/20/21 232 lb 1.9 oz (105.3 kg)  01/18/21 237 lb 11.2 oz (107.8 kg)   Temp Readings from Last 3 Encounters:  12/21/21 97.6 F  (36.4 C) (Oral)  06/20/21 (!) 97.5 F (36.4 C) (Temporal)  03/27/21 98.8 F (37.1 C) (Oral)   BP Readings from Last 3 Encounters:  12/21/21 120/64  06/20/21 114/80  03/27/21 (!) 135/98   Pulse Readings from Last 3 Encounters:  12/21/21 (!) 56  06/20/21 74  03/27/21 85    Physical Exam Vitals and nursing note reviewed.  Constitutional:      Appearance: Normal appearance. He is well-developed and well-groomed.  HENT:     Head: Normocephalic and atraumatic.  Eyes:     Conjunctiva/sclera: Conjunctivae normal.     Pupils: Pupils are equal, round, and reactive to light.  Cardiovascular:     Rate and Rhythm: Normal rate and regular rhythm.     Heart sounds: Normal heart sounds.  Pulmonary:     Effort: Pulmonary effort is normal. No respiratory distress.     Breath sounds: Normal breath sounds.  Abdominal:     Tenderness: There is no abdominal tenderness.     Comments: Diastasis recti   Skin:    General: Skin is warm and moist.  Neurological:     General: No focal deficit present.     Mental Status: He is alert and oriented to person, place, and time. Mental status is at baseline.     Sensory: Sensation is intact.     Motor: Motor function is intact.     Coordination: Coordination is intact.     Gait: Gait is intact. Gait normal.  Psychiatric:        Attention and Perception: Attention and perception normal.        Mood and  Affect: Mood and affect normal.        Speech: Speech normal.        Behavior: Behavior normal. Behavior is cooperative.        Thought Content: Thought content normal.        Cognition and Memory: Cognition and memory normal.        Judgment: Judgment normal.     Assessment  Plan  Annual physical exam See below  B12 deficiency - Plan: cyanocobalamin (VITAMIN B12) 1000 MCG/ML injection, Syringe/Needle, Disp, 25G X 1-1/2" 1 ML MISC, cyanocobalamin (VITAMIN B12) injection 1,000 mcg  Diastasis of rectus abdominis  Hypertension associated with  diabetes a1c 7.8 12/20/21  On amaryl 2 mg in am and '1mg'$  supper  Metformin 850 mg bid Farxiga 10 mg qd  Lis 40, lipitor 80 and zetia 10   HM Flu shot ? Had at work late 12/2021  tdap had 06/25/15 Disc shingrix prev will get in the future  Prevnar 20 consider 11/08/25   pna 23 had 04/05/14  Prevnar utd 11/08/20  Pfizer 4/4 last 05/31/20  will get booster in the future   PSA, ED, low T with Dr. Tonette Bihari [facility] saw w/in the last few months 2020 get records  09/15/18 [facility] appt low T PSA 09/15/18 1.28, T was 1003.6  -on testosterone Q1-2 weeks per urology  -appt 12/30/19 seen Dr. Diona Fanti PSA 09/23/19 PSA 1.01 and testosterone 232 as of 12/30/19 stopped testosterone on clomid rec f/u endocrine F/u urology 10/2020 and Q6 months cc'ed them 06/20/21 to get last PSA   Colonoscopy 02/04/15 Dr. Melina Copa diverticulosis f/u in 5 years [place] due 02/2020 pt to call and schedule  -sent ROI Dr. Melina Copa 04/20/20 diverticulosis otherwise normal f/u in 10 year Dr. Nehemiah Settle [facility] 08/22/10 +polyps  Colonoscopy due 04/20/2030    Eye exam 01/23/21   Hep C negative     Dermatology Dr. Nehemiah Massed due to f/u in 10 or 02/2019 pt will call for appt as of 02/24/2019  -seen [place] derm in 2021 ROI Dr. Michele Mcalpine 11/26/19 [place], alopecia totalis rec wheat belly diet   Skin cancer screening ln2 arms in 2022 and f/u 1 year    Never smoker Former etoh abuse in 9s but quit in 49s   rec healthy diet and exercise   Urology as of 2021 had prostate exam f/u Dr. Marzetta Board  PSA was 1.47 on 8.12.2022  Next visit should be in 2-3 mos--our office should be contacting him as of 05/2021 urology    [facility] endocrine Dr. Honor Junes  Provider: Dr. Olivia Mackie McLean-Scocuzza-Internal Medicine

## 2021-12-26 DIAGNOSIS — I236 Thrombosis of atrium, auricular appendage, and ventricle as current complications following acute myocardial infarction: Secondary | ICD-10-CM | POA: Diagnosis not present

## 2021-12-26 DIAGNOSIS — Z8673 Personal history of transient ischemic attack (TIA), and cerebral infarction without residual deficits: Secondary | ICD-10-CM | POA: Diagnosis not present

## 2021-12-26 DIAGNOSIS — I240 Acute coronary thrombosis not resulting in myocardial infarction: Secondary | ICD-10-CM | POA: Diagnosis not present

## 2021-12-26 DIAGNOSIS — R0602 Shortness of breath: Secondary | ICD-10-CM | POA: Diagnosis not present

## 2021-12-26 DIAGNOSIS — I208 Other forms of angina pectoris: Secondary | ICD-10-CM | POA: Diagnosis not present

## 2021-12-26 DIAGNOSIS — G459 Transient cerebral ischemic attack, unspecified: Secondary | ICD-10-CM | POA: Diagnosis not present

## 2021-12-26 DIAGNOSIS — I25119 Atherosclerotic heart disease of native coronary artery with unspecified angina pectoris: Secondary | ICD-10-CM | POA: Diagnosis not present

## 2021-12-26 DIAGNOSIS — I639 Cerebral infarction, unspecified: Secondary | ICD-10-CM | POA: Diagnosis not present

## 2021-12-26 DIAGNOSIS — R079 Chest pain, unspecified: Secondary | ICD-10-CM | POA: Diagnosis not present

## 2021-12-27 DIAGNOSIS — E785 Hyperlipidemia, unspecified: Secondary | ICD-10-CM | POA: Diagnosis not present

## 2021-12-27 DIAGNOSIS — E1159 Type 2 diabetes mellitus with other circulatory complications: Secondary | ICD-10-CM | POA: Diagnosis not present

## 2021-12-27 DIAGNOSIS — I152 Hypertension secondary to endocrine disorders: Secondary | ICD-10-CM | POA: Diagnosis not present

## 2021-12-27 DIAGNOSIS — E039 Hypothyroidism, unspecified: Secondary | ICD-10-CM | POA: Diagnosis not present

## 2021-12-27 DIAGNOSIS — E1169 Type 2 diabetes mellitus with other specified complication: Secondary | ICD-10-CM | POA: Diagnosis not present

## 2021-12-27 DIAGNOSIS — E119 Type 2 diabetes mellitus without complications: Secondary | ICD-10-CM | POA: Diagnosis not present

## 2022-01-02 NOTE — Progress Notes (Signed)
Carelink Summary Report / Loop Recorder 

## 2022-01-08 ENCOUNTER — Other Ambulatory Visit: Payer: Self-pay

## 2022-01-08 MED ORDER — ELIQUIS 5 MG PO TABS
ORAL_TABLET | ORAL | 7 refills | Status: DC
  Filled 2022-01-08: qty 60, 30d supply, fill #0
  Filled 2022-02-13: qty 60, 30d supply, fill #1
  Filled 2022-03-19: qty 60, 30d supply, fill #2
  Filled 2022-04-23: qty 60, 30d supply, fill #3
  Filled 2022-05-21: qty 60, 30d supply, fill #4
  Filled 2022-06-18: qty 60, 30d supply, fill #5
  Filled 2022-07-23: qty 60, 30d supply, fill #6
  Filled 2022-08-22: qty 60, 30d supply, fill #7
  Filled 2022-09-17: qty 60, 30d supply, fill #8
  Filled 2022-10-22: qty 60, 30d supply, fill #9
  Filled 2022-11-28: qty 60, 30d supply, fill #10
  Filled 2022-12-26 – 2022-12-27 (×2): qty 60, 30d supply, fill #0

## 2022-01-09 ENCOUNTER — Other Ambulatory Visit: Payer: Self-pay

## 2022-01-11 DIAGNOSIS — K5900 Constipation, unspecified: Secondary | ICD-10-CM | POA: Diagnosis not present

## 2022-01-22 ENCOUNTER — Ambulatory Visit (INDEPENDENT_AMBULATORY_CARE_PROVIDER_SITE_OTHER): Payer: 59

## 2022-01-22 DIAGNOSIS — I6340 Cerebral infarction due to embolism of unspecified cerebral artery: Secondary | ICD-10-CM

## 2022-01-23 LAB — CUP PACEART REMOTE DEVICE CHECK
Date Time Interrogation Session: 20231022230943
Implantable Pulse Generator Implant Date: 20210503

## 2022-01-29 ENCOUNTER — Other Ambulatory Visit: Payer: Self-pay

## 2022-02-12 NOTE — Progress Notes (Signed)
Carelink Summary Report / Loop Recorder 

## 2022-02-13 ENCOUNTER — Other Ambulatory Visit: Payer: Self-pay

## 2022-02-26 ENCOUNTER — Other Ambulatory Visit: Payer: Self-pay

## 2022-02-26 ENCOUNTER — Ambulatory Visit (INDEPENDENT_AMBULATORY_CARE_PROVIDER_SITE_OTHER): Payer: 59

## 2022-02-26 DIAGNOSIS — I6340 Cerebral infarction due to embolism of unspecified cerebral artery: Secondary | ICD-10-CM

## 2022-02-26 LAB — CUP PACEART REMOTE DEVICE CHECK
Date Time Interrogation Session: 20231126230632
Implantable Pulse Generator Implant Date: 20210503

## 2022-03-05 ENCOUNTER — Other Ambulatory Visit: Payer: Self-pay

## 2022-03-19 ENCOUNTER — Other Ambulatory Visit: Payer: Self-pay

## 2022-03-22 ENCOUNTER — Other Ambulatory Visit: Payer: Self-pay

## 2022-03-23 ENCOUNTER — Other Ambulatory Visit: Payer: Self-pay

## 2022-03-23 MED ORDER — GLIMEPIRIDE 2 MG PO TABS
2.0000 mg | ORAL_TABLET | ORAL | 3 refills | Status: DC
  Filled 2022-03-23: qty 180, 90d supply, fill #0
  Filled 2022-06-18: qty 180, 90d supply, fill #1

## 2022-03-27 LAB — MICROALBUMIN, URINE: Microalb, Ur: 13.7

## 2022-03-30 DIAGNOSIS — N5201 Erectile dysfunction due to arterial insufficiency: Secondary | ICD-10-CM | POA: Diagnosis not present

## 2022-04-03 ENCOUNTER — Ambulatory Visit (INDEPENDENT_AMBULATORY_CARE_PROVIDER_SITE_OTHER): Payer: 59

## 2022-04-03 DIAGNOSIS — I6340 Cerebral infarction due to embolism of unspecified cerebral artery: Secondary | ICD-10-CM

## 2022-04-03 LAB — CUP PACEART REMOTE DEVICE CHECK
Date Time Interrogation Session: 20240101230556
Implantable Pulse Generator Implant Date: 20210503

## 2022-04-06 NOTE — Progress Notes (Signed)
Carelink Summary Report / Loop Recorder 

## 2022-04-20 DIAGNOSIS — E119 Type 2 diabetes mellitus without complications: Secondary | ICD-10-CM | POA: Diagnosis not present

## 2022-04-20 DIAGNOSIS — H02831 Dermatochalasis of right upper eyelid: Secondary | ICD-10-CM | POA: Diagnosis not present

## 2022-04-20 LAB — HM DIABETES EYE EXAM

## 2022-04-23 ENCOUNTER — Other Ambulatory Visit: Payer: Self-pay

## 2022-04-30 NOTE — Progress Notes (Signed)
Carelink Summary Report / Loop Recorder

## 2022-05-01 ENCOUNTER — Other Ambulatory Visit: Payer: Self-pay

## 2022-05-02 DIAGNOSIS — E785 Hyperlipidemia, unspecified: Secondary | ICD-10-CM | POA: Diagnosis not present

## 2022-05-02 DIAGNOSIS — E039 Hypothyroidism, unspecified: Secondary | ICD-10-CM | POA: Diagnosis not present

## 2022-05-02 DIAGNOSIS — E1159 Type 2 diabetes mellitus with other circulatory complications: Secondary | ICD-10-CM | POA: Diagnosis not present

## 2022-05-02 DIAGNOSIS — E1169 Type 2 diabetes mellitus with other specified complication: Secondary | ICD-10-CM | POA: Diagnosis not present

## 2022-05-02 DIAGNOSIS — I152 Hypertension secondary to endocrine disorders: Secondary | ICD-10-CM | POA: Diagnosis not present

## 2022-05-02 LAB — HM DIABETES FOOT EXAM: HM Diabetic Foot Exam: NORMAL

## 2022-05-02 LAB — HEMOGLOBIN A1C: Hemoglobin A1C: 7.4

## 2022-05-06 LAB — CUP PACEART REMOTE DEVICE CHECK
Date Time Interrogation Session: 20240203230332
Implantable Pulse Generator Implant Date: 20210503

## 2022-05-07 ENCOUNTER — Ambulatory Visit: Payer: Commercial Managed Care - PPO

## 2022-05-07 DIAGNOSIS — I6340 Cerebral infarction due to embolism of unspecified cerebral artery: Secondary | ICD-10-CM

## 2022-05-21 ENCOUNTER — Other Ambulatory Visit: Payer: Self-pay

## 2022-05-29 ENCOUNTER — Other Ambulatory Visit: Payer: Self-pay

## 2022-06-04 ENCOUNTER — Other Ambulatory Visit: Payer: Self-pay

## 2022-06-05 ENCOUNTER — Other Ambulatory Visit: Payer: Self-pay

## 2022-06-07 ENCOUNTER — Other Ambulatory Visit: Payer: Self-pay

## 2022-06-11 ENCOUNTER — Ambulatory Visit: Payer: Commercial Managed Care - PPO

## 2022-06-11 DIAGNOSIS — I6340 Cerebral infarction due to embolism of unspecified cerebral artery: Secondary | ICD-10-CM | POA: Diagnosis not present

## 2022-06-11 LAB — CUP PACEART REMOTE DEVICE CHECK
Date Time Interrogation Session: 20240310230123
Implantable Pulse Generator Implant Date: 20210503

## 2022-06-18 ENCOUNTER — Other Ambulatory Visit: Payer: Self-pay

## 2022-06-19 NOTE — Progress Notes (Signed)
Carelink Summary Report / Loop Recorder 

## 2022-06-20 NOTE — Progress Notes (Signed)
SUBJECTIVE:   Chief Complaint  Patient presents with   Transitions Of Care   HPI Patient presents to clinic for transfer of care  No acute concerns today  HTN Asymptomatic.  Currently on lisinopril 40 mg daily and metoprolol XL 50 mg daily.  Tolerating well.  Follows with cardiology.  Denies any headaches, visual changes, chest pain, shortness of breath or lower extremity edema.  DM Type 2 Asymptomatic.  Recent A1c 7.4 on 01/24.  Follows with endocrinology at [facility] clinic.  Currently on glimepiride 2 mg twice daily, metformin 850 mg twice daily and Farxiga 10 mg daily.  Has stopped Mounjaro secondary to GI side effect  History of left ventricular apical thrombus/CAD/history MI Reports thrombus now resolved and currently on apixaban 5 mg twice daily.  Follows with cardiology no yearly follow-up.  History of multiple CVA/history ICH/Intracranial atherosclerosis Posterior right MCA 2016, right superior cerebellar infarct 2021, right MCA infarct status post tPA x 2 and left M1 endovascular clot retrieval in 9/22.  Previously on Coumadin and now on Eliquis and statin therapy.  Likely secondary to left ventricular thrombus now being followed by cardiology and noted to have improvement.  Completed speech therapy and reports significant improvement although he still can tell there is a difference with getting his words out.  Follows with Dr. Manuella Ghazi at [place] clinic, next follow-up due 11/2022.  Hypothyroid Asymptomatic.  Currently on levothyroxine 75 mcg daily.  Follows with endocrinology at [facility] clinic.  Hyperlipidemia On atorvastatin 80 mg daily and Zetia 10 mg daily.  Tolerating medication well.  PERTINENT PMH / PSH: Hypertension Diabetes type 2 Hyperlipidemia Hypothyroidism History of CVA on apixaban History of left ventricular mural thrombus History of MI History of GERD   OBJECTIVE:  BP 130/80   Pulse 66   Temp 97.6 F (36.4 C) (Oral)   Ht 6' (1.829 m)   Wt 233  lb 6.4 oz (105.9 kg)   SpO2 97%   BMI 31.65 kg/m    Physical Exam Vitals reviewed.  Constitutional:      General: He is not in acute distress.    Appearance: He is normal weight. He is not ill-appearing.  HENT:     Head: Normocephalic.  Eyes:     Conjunctiva/sclera: Conjunctivae normal.  Neck:     Thyroid: No thyromegaly or thyroid tenderness.  Cardiovascular:     Rate and Rhythm: Normal rate and regular rhythm.     Pulses: Normal pulses.  Pulmonary:     Effort: Pulmonary effort is normal.     Breath sounds: Normal breath sounds.  Abdominal:     General: Bowel sounds are normal.  Neurological:     Mental Status: He is alert. Mental status is at baseline.  Psychiatric:        Mood and Affect: Mood normal.        Behavior: Behavior normal.        Thought Content: Thought content normal.        Judgment: Judgment normal.     ASSESSMENT/PLAN:  Hypertension associated with diabetes ([place]) Assessment & Plan: Chronic.  Stable.  Tolerating medication well.  Well-controlled Continue lisinopril 40 mg daily Continue metoprolol XL 50 mg daily Follow-up with cardiology and endocrinology as scheduled.   Left ventricular apical thrombus Assessment & Plan: History of left ventricular apical thrombus.  Appears to have resolved with apixaban per recent cardiology note Continue apixaban 5 mg twice daily Continue to follow-up with cardiology as scheduled.   Intracranial atherosclerosis Assessment &  Plan: Chronic.  Follows with [facility] clinic, Dr. Pete Glatter neurology Continue statin and Zetia.   Controlled type 2 diabetes mellitus with complication, without long-term current use of insulin (HCC) Assessment & Plan: Chronic.  Stable.  Recent A1c 7.4.  Not at goal less than 7.  Self discontinued Mounjaro secondary to GI side effects. Continue metformin 850 twice daily Continue glimepiride 2 mg twice daily Continue Farxiga 10 mg daily, could consider increasing dose given GFR greater  than 60. Follow-up with endocrinology as scheduled.   Mixed hyperlipidemia Assessment & Plan: Chronic.  Stable. On statin therapy and tolerating well.  No myalgias. Continue atorvastatin 80 mg daily and Zetia 10 mg daily Repeat lipids annually.    Recurrent oral herpes simplex infection Assessment & Plan: Chronic.  Oral HSV Refill valacyclovir 1 g take 2 tablets 12 hours apart x 24 hours on initiation of breakout.   Orders: -     valACYclovir HCl; take 1 to 2 tablets by mouth every 12 hours for 2 to 3 days as needed for fever blisters  Dispense: 90 tablet; Refill: 3   PDMP reviewed  Return in about 6 months (around 12/22/2022).  Carollee Leitz, MD

## 2022-06-20 NOTE — Patient Instructions (Incomplete)
It was a pleasure meeting you today. Thank you for allowing me to take part in your health care.  Our goals for today as we discussed include:  Recommend Shingles vaccine.  This is a 2 dose series and can be given at your local pharmacy.  Please talk to your pharmacist about this.   Recommend Hepatitis B vaccine. 2 dose series     If you have any questions or concerns, please do not hesitate to call the office at [phone].  I look forward to our next visit and until then take care and stay safe.  Regards,   Dana Allan, MD   [facility]

## 2022-06-21 ENCOUNTER — Encounter: Payer: Self-pay | Admitting: Family Medicine

## 2022-06-21 ENCOUNTER — Other Ambulatory Visit: Payer: Self-pay

## 2022-06-21 ENCOUNTER — Ambulatory Visit: Payer: Commercial Managed Care - PPO | Admitting: Family Medicine

## 2022-06-21 VITALS — BP 130/80 | HR 66 | Temp 97.6°F | Ht 72.0 in | Wt 233.4 lb

## 2022-06-21 DIAGNOSIS — E118 Type 2 diabetes mellitus with unspecified complications: Secondary | ICD-10-CM | POA: Diagnosis not present

## 2022-06-21 DIAGNOSIS — I672 Cerebral atherosclerosis: Secondary | ICD-10-CM

## 2022-06-21 DIAGNOSIS — I152 Hypertension secondary to endocrine disorders: Secondary | ICD-10-CM | POA: Diagnosis not present

## 2022-06-21 DIAGNOSIS — I513 Intracardiac thrombosis, not elsewhere classified: Secondary | ICD-10-CM | POA: Diagnosis not present

## 2022-06-21 DIAGNOSIS — E1159 Type 2 diabetes mellitus with other circulatory complications: Secondary | ICD-10-CM | POA: Diagnosis not present

## 2022-06-21 DIAGNOSIS — E782 Mixed hyperlipidemia: Secondary | ICD-10-CM

## 2022-06-21 DIAGNOSIS — B0089 Other herpesviral infection: Secondary | ICD-10-CM | POA: Diagnosis not present

## 2022-06-21 MED ORDER — VALACYCLOVIR HCL 1 G PO TABS
ORAL_TABLET | ORAL | 3 refills | Status: AC
  Filled 2022-06-21: qty 90, 30d supply, fill #0
  Filled 2022-12-20: qty 90, 30d supply, fill #1

## 2022-06-26 ENCOUNTER — Encounter: Payer: Self-pay | Admitting: Family Medicine

## 2022-06-26 NOTE — Assessment & Plan Note (Signed)
History of left ventricular apical thrombus.  Appears to have resolved with apixaban per recent cardiology note Continue apixaban 5 mg twice daily Continue to follow-up with cardiology as scheduled.

## 2022-06-26 NOTE — Assessment & Plan Note (Signed)
Chronic.  Stable. On statin therapy and tolerating well.  No myalgias. Continue atorvastatin 80 mg daily and Zetia 10 mg daily Repeat lipids annually.

## 2022-06-26 NOTE — Assessment & Plan Note (Signed)
Chronic.  Stable.  Recent A1c 7.4.  Not at goal less than 7.  Self discontinued Mounjaro secondary to GI side effects. Continue metformin 850 twice daily Continue glimepiride 2 mg twice daily Continue Farxiga 10 mg daily, could consider increasing dose given GFR greater than 60. Follow-up with endocrinology as scheduled.

## 2022-06-26 NOTE — Assessment & Plan Note (Signed)
Chronic.  Follows with [facility] clinic, Dr. Pete Glatter neurology Continue statin and Zetia.

## 2022-06-26 NOTE — Assessment & Plan Note (Signed)
Chronic.  Stable.  Tolerating medication well.  Well-controlled Continue lisinopril 40 mg daily Continue metoprolol XL 50 mg daily Follow-up with cardiology and endocrinology as scheduled.

## 2022-06-26 NOTE — Assessment & Plan Note (Signed)
Chronic.  Oral HSV Refill valacyclovir 1 g take 2 tablets 12 hours apart x 24 hours on initiation of breakout.

## 2022-07-02 ENCOUNTER — Other Ambulatory Visit: Payer: Self-pay

## 2022-07-05 ENCOUNTER — Other Ambulatory Visit: Payer: Self-pay

## 2022-07-11 ENCOUNTER — Other Ambulatory Visit: Payer: Self-pay

## 2022-07-16 ENCOUNTER — Ambulatory Visit (INDEPENDENT_AMBULATORY_CARE_PROVIDER_SITE_OTHER): Payer: Commercial Managed Care - PPO

## 2022-07-16 DIAGNOSIS — I6340 Cerebral infarction due to embolism of unspecified cerebral artery: Secondary | ICD-10-CM | POA: Diagnosis not present

## 2022-07-16 LAB — CUP PACEART REMOTE DEVICE CHECK
Date Time Interrogation Session: 20240414230500
Implantable Pulse Generator Implant Date: 20210503

## 2022-07-18 NOTE — Progress Notes (Signed)
Carelink Summary Report / Loop Recorder 

## 2022-07-23 ENCOUNTER — Other Ambulatory Visit: Payer: Self-pay

## 2022-08-06 ENCOUNTER — Other Ambulatory Visit: Payer: Self-pay

## 2022-08-17 NOTE — Progress Notes (Signed)
Carelink Summary Report / Loop Recorder 

## 2022-08-20 ENCOUNTER — Ambulatory Visit (INDEPENDENT_AMBULATORY_CARE_PROVIDER_SITE_OTHER): Payer: Commercial Managed Care - PPO

## 2022-08-20 DIAGNOSIS — I6340 Cerebral infarction due to embolism of unspecified cerebral artery: Secondary | ICD-10-CM | POA: Diagnosis not present

## 2022-08-20 LAB — CUP PACEART REMOTE DEVICE CHECK
Date Time Interrogation Session: 20240517230544
Implantable Pulse Generator Implant Date: 20210503

## 2022-08-21 DIAGNOSIS — E119 Type 2 diabetes mellitus without complications: Secondary | ICD-10-CM | POA: Diagnosis not present

## 2022-08-22 ENCOUNTER — Other Ambulatory Visit: Payer: Self-pay

## 2022-08-22 MED ORDER — ZOSTER VAC RECOMB ADJUVANTED 50 MCG/0.5ML IM SUSR
0.5000 mL | Freq: Once | INTRAMUSCULAR | 1 refills | Status: AC
  Filled 2022-08-24: qty 0.5, 1d supply, fill #0
  Filled 2022-11-28: qty 0.5, 1d supply, fill #1

## 2022-08-24 ENCOUNTER — Other Ambulatory Visit: Payer: Self-pay

## 2022-08-28 ENCOUNTER — Other Ambulatory Visit: Payer: Self-pay

## 2022-08-30 ENCOUNTER — Other Ambulatory Visit: Payer: Self-pay

## 2022-09-04 ENCOUNTER — Other Ambulatory Visit: Payer: Self-pay

## 2022-09-10 ENCOUNTER — Other Ambulatory Visit: Payer: Self-pay

## 2022-09-11 ENCOUNTER — Other Ambulatory Visit: Payer: Self-pay

## 2022-09-14 NOTE — Progress Notes (Signed)
Carelink Summary Report / Loop Recorder 

## 2022-09-17 ENCOUNTER — Other Ambulatory Visit: Payer: Self-pay

## 2022-09-17 MED ORDER — GLIMEPIRIDE 2 MG PO TABS
2.0000 mg | ORAL_TABLET | Freq: Two times a day (BID) | ORAL | 3 refills | Status: DC
  Filled 2022-09-17: qty 180, 90d supply, fill #0

## 2022-09-20 ENCOUNTER — Ambulatory Visit (INDEPENDENT_AMBULATORY_CARE_PROVIDER_SITE_OTHER): Payer: Commercial Managed Care - PPO

## 2022-09-20 DIAGNOSIS — I6340 Cerebral infarction due to embolism of unspecified cerebral artery: Secondary | ICD-10-CM

## 2022-09-20 LAB — CUP PACEART REMOTE DEVICE CHECK
Date Time Interrogation Session: 20240619230043
Implantable Pulse Generator Implant Date: 20210503

## 2022-10-02 ENCOUNTER — Other Ambulatory Visit: Payer: Self-pay

## 2022-10-08 ENCOUNTER — Other Ambulatory Visit: Payer: Self-pay

## 2022-10-10 NOTE — Progress Notes (Signed)
Carelink Summary Report / Loop Recorder 

## 2022-10-22 ENCOUNTER — Ambulatory Visit (INDEPENDENT_AMBULATORY_CARE_PROVIDER_SITE_OTHER): Payer: Commercial Managed Care - PPO

## 2022-10-22 ENCOUNTER — Other Ambulatory Visit: Payer: Self-pay

## 2022-10-22 DIAGNOSIS — I6340 Cerebral infarction due to embolism of unspecified cerebral artery: Secondary | ICD-10-CM

## 2022-10-22 LAB — CUP PACEART REMOTE DEVICE CHECK
Date Time Interrogation Session: 20240721230431
Implantable Pulse Generator Implant Date: 20210503

## 2022-10-29 ENCOUNTER — Other Ambulatory Visit: Payer: Self-pay

## 2022-10-29 DIAGNOSIS — E119 Type 2 diabetes mellitus without complications: Secondary | ICD-10-CM | POA: Diagnosis not present

## 2022-10-29 DIAGNOSIS — M791 Myalgia, unspecified site: Secondary | ICD-10-CM | POA: Diagnosis not present

## 2022-10-29 DIAGNOSIS — S29012A Strain of muscle and tendon of back wall of thorax, initial encounter: Secondary | ICD-10-CM | POA: Diagnosis not present

## 2022-10-29 DIAGNOSIS — S29019A Strain of muscle and tendon of unspecified wall of thorax, initial encounter: Secondary | ICD-10-CM | POA: Diagnosis not present

## 2022-10-31 ENCOUNTER — Other Ambulatory Visit: Payer: Self-pay

## 2022-11-01 ENCOUNTER — Other Ambulatory Visit: Payer: Self-pay

## 2022-11-01 NOTE — Progress Notes (Signed)
Carelink Summary Report / Loop Recorder 

## 2022-11-02 DIAGNOSIS — M7712 Lateral epicondylitis, left elbow: Secondary | ICD-10-CM | POA: Diagnosis not present

## 2022-11-02 DIAGNOSIS — M25512 Pain in left shoulder: Secondary | ICD-10-CM | POA: Diagnosis not present

## 2022-11-02 DIAGNOSIS — M7582 Other shoulder lesions, left shoulder: Secondary | ICD-10-CM | POA: Diagnosis not present

## 2022-11-02 DIAGNOSIS — S46812A Strain of other muscles, fascia and tendons at shoulder and upper arm level, left arm, initial encounter: Secondary | ICD-10-CM | POA: Diagnosis not present

## 2022-11-02 DIAGNOSIS — M47812 Spondylosis without myelopathy or radiculopathy, cervical region: Secondary | ICD-10-CM | POA: Diagnosis not present

## 2022-11-05 NOTE — Therapy (Signed)
OUTPATIENT PHYSICAL THERAPY SHOULDER EVALUATION   Patient Name: Karl Morgan [place] MRN: [id number] DOB:[birth year], 62 y.o., male Today's Date: 11/06/2022  END OF SESSION:  PT End of Session - 11/06/22 1530     Visit Number 1    Number of Visits 16    Date for PT Re-Evaluation 01/01/23    Authorization Type 1/10    PT Start Time 1445    PT Stop Time 1529    PT Time Calculation (min) 44 min    Activity Tolerance Patient tolerated treatment well    Behavior During Therapy [facility] for tasks assessed/performed             Past Medical History:  Diagnosis Date   Alopecia 04/04/2011   Diabetes mellitus    Erectile dysfunction 04/04/2011   Heart attack (HCC) 10/07/2014   Overview:  Status post STEMI, anterior myocardial infarction status post DES stent x 2 to the LAD, mid and distal.    Heart murmur    Hyperlipidemia 04/04/2011   Hypogonadism male 04/04/2011   Myocardial infarction [facility])    Stroke (HCC)    x 3 as of 05/2021 last stroke 12/17/20 with aphasia   Substance abuse (HCC)    Type II or unspecified type diabetes mellitus without mention of complication, uncontrolled 04/04/2011   Unspecified essential hypertension 04/04/2011   Past Surgical History:  Procedure Laterality Date   BUBBLE STUDY  08/03/2019   Procedure: BUBBLE STUDY;  Surgeon: Pricilla Riffle, MD;  Location: [facility] ENDOSCOPY;  Service: Cardiovascular;;   CARDIAC CATHETERIZATION     IR ANGIO INTRA EXTRACRAN SEL COM CAROTID INNOMINATE UNI L MOD SED  12/17/2020   IR CT HEAD LTD  12/17/2020   IR PERCUTANEOUS ART THROMBECTOMY/INFUSION INTRACRANIAL INC DIAG ANGIO  12/17/2020   IR US GUIDE VASC ACCESS RIGHT  12/17/2020   LOOP RECORDER INSERTION N/A 08/03/2019   Procedure: LOOP RECORDER INSERTION;  Surgeon: Marinus Maw, MD;  Location: [facility] INVASIVE CV LAB;  Service: Cardiovascular;  Laterality: N/A;   RADIOLOGY WITH ANESTHESIA N/A 12/17/2020   Procedure: IR WITH ANESTHESIA;  Surgeon: Julieanne Cotton, MD;  Location: [facility] OR;   Service: Radiology;  Laterality: N/A;   TEE WITHOUT CARDIOVERSION N/A 08/03/2019   Procedure: TRANSESOPHAGEAL ECHOCARDIOGRAM (TEE);  Surgeon: Pricilla Riffle, MD;  Location: [facility] ENDOSCOPY;  Service: Cardiovascular;  Laterality: N/A;   Patient Active Problem List   Diagnosis Date Noted   Diastasis of rectus abdominis 12/21/2021   Aphasia 06/21/2021   Need for vaccination 01/18/2021   Elevation of level of transaminase or lactic acid dehydrogenase (LDH) 01/18/2021   Elevated WBC count 01/18/2021   Chest pain 01/18/2021   Acute inferior myocardial infarction (HCC) 01/18/2021   ICH (intracerebral hemorrhage) (HCC) 12/31/2020   Left ventricular apical thrombus    Hypertension associated with diabetes (HCC) 11/08/2020   Gallbladder polyp 11/12/2019   Fatty liver 11/12/2019   Bilateral carotid artery stenosis 08/06/2019   Mitral valve insufficiency 08/06/2019   Cerebrovascular accident (CVA) due to embolism of cerebral artery (HCC) 08/06/2019   Intracranial atherosclerosis 08/06/2019   Arthritis 10/21/2018   Obesity (BMI 30-39.9) 08/22/2017   Herpes simplex type 1 infection 02/21/2015   Recurrent oral herpes simplex infection 02/21/2015   History of stroke 02/16/2015   Arteriosclerosis of coronary artery 10/07/2014   Hypotestosteronism 10/07/2014   B12 deficiency 12/18/2011   Hypothyroidism 05/04/2011   Annual physical exam 04/05/2011   Diabetes mellitus type 2, controlled, with complications (HCC) 04/04/2011   Essential hypertension 04/04/2011  Hyperlipidemia 04/04/2011   Hypogonadism male 04/04/2011   Alopecia 04/04/2011   Erectile dysfunction 04/04/2011    PCP: Dana Allan MD  REFERRING PROVIDER: Dana Allan MD  REFERRING DIAG: strain on L shoulder, spondylosis without myelopathy cervical region, other shoulder lesions L shoulder, lateral epicondylitis L elbow   THERAPY DIAG:  Acute pain of left shoulder  Abnormal posture  Rationale for Evaluation and Treatment:  Rehabilitation  ONSET DATE: 3 weeks ago   SUBJECTIVE:                                                                                                                                                                                      SUBJECTIVE STATEMENT: Patient presents for L shoulder pain.  Hand dominance: Left  PERTINENT HISTORY: Patient presnts for L shoulder pain and spondylosis of cervical region with additional left elbow lateral epicondylitis. PMH includes alopecia, DM, ED, HA, HLD, MI, stroke x 3, substance abuse, Type II DM, and HTN. Patient drives 150 miles a day. Patient had an injection last Friday and has had a cortisone injection. Has been having pain for over three weeks.  Pain radiates to elbow.   PAIN:  Are you having pain? Yes: NPRS scale: 2/10 Pain location: L upper trap to Forearm  Pain description: throbby  Aggravating factors: driving Relieving factors: massage  Worst pain: 6/10 Least pain: 2/10   PRECAUTIONS: None  RED FLAGS: None  WEIGHT BEARING RESTRICTIONS: No  FALLS:  Has patient fallen in last 6 months? No  LIVING ENVIRONMENT: Lives with: lives with their spouse Lives in: House/apartment  Has following equipment at home: None  OCCUPATION: Driving long distances, at desk, leaning forward,   PLOF: Independent  PATIENT GOALS:to decrease pain  NEXT MD VISIT:   OBJECTIVE:   DIAGNOSTIC FINDINGS:  Unable to be seen via [facility] system, in [place] system   PATIENT SURVEYS:  FOTO 67  COGNITION: Overall cognitive status: Within functional limits for tasks assessed      POSTURE: Forward head, rounded shoulders  UPPER EXTREMITY ROM:   Active ROM Right eval Left eval  Shoulder flexion 140 128  Shoulder extension 90 43  Shoulder abduction 145 115  (Blank rows = not tested)  UPPER EXTREMITY MMT:  MMT Right eval Left eval  Shoulder flexion 5 4  Shoulder extension 5 4  Shoulder abduction 5 4-*  Elbow flexion 5 4  Elbow  extension 5 4  (Blank rows = not tested)  SHOULDER SPECIAL TESTS: Impingement tests: Neer impingement test: positive , Hawkins/Kennedy impingement test: positive , and Painful arc test: positive  SLAP lesions: Biceps load test: negative Instability tests: Apprehension test: negative and Sulcus  sign: negative Rotator cuff assessment: Full can test: positive for pain Biceps assessment: Yergason's test: negative  JOINT MOBILITY TESTING:  Hypomobile painful with muscle guarding  PALPATION:  Tension of L upper trap, L subscap, L bicep; L wrist flexors and extensors.    TODAY'S TREATMENT:                                                                                                                                         DATE: 11/06/22 Eval  Trigger Point Dry Needling (TDN), unbilled Education performed with patient regarding potential benefit of TDN. Reviewed precautions and risks with patient. Reviewed special precautions/risks over lung fields which include pneumothorax. Reviewed signs and symptoms of pneumothorax and advised pt to go to ER immediately if these symptoms develop advise them of dry needling treatment. Extensive time spent with pt to ensure full understanding of TDN risks. Pt provided verbal consent to treatment. TDN performed to  with 0.25 x 40 single needle placements with local twitch response (LTR). Pistoning technique utilized. Improved pain-free motion following intervention. L upper trap and wrist extensors/flexors x2 minutes     PATIENT EDUCATION: Education details: goals, POC, HEP Person educated: Patient Education method: Explanation, Demonstration, Tactile cues, Verbal cues, and Handouts Education comprehension: verbalized understanding, returned demonstration, verbal cues required, tactile cues required, and needs further education  HOME EXERCISE PROGRAM: Access Code: [email or web address] URL: [email or web address] Date: 11/06/2022 Prepared by: Precious Bard  Exercises - Seated Scapular Retraction  - 1 x daily - 7 x weekly - 2 sets - 10 reps - 5 hold - Supine Chin Tuck  - 1 x daily - 7 x weekly - 2 sets - 10 reps - 5 hold - Seated Upper Trapezius Stretch  - 1 x daily - 7 x weekly - 2 sets - 2 reps - 30 hold  ASSESSMENT:  CLINICAL IMPRESSION: Patient is a 62 y.o. male who was seen today for physical therapy evaluation and treatment for  L shoulder pain and spondylosis of cervical region with additional left elbow lateral epicondylitis.  Patient has signs and symptoms indicative of impingement secondary to postural limitations. He has significant forward head rounded shoulder posture that is aggravated from prolonged commute and work task requirements. Significant muscle guarding is noted in L UE and trap region. Patient introduced to TDN for initially upper trap release and elbow release. Patient will retire in 7 weeks and would like to be pain free by retirement. Patient will benefit from skilled physical therapy to reduce pain and improve mobility.   OBJECTIVE IMPAIRMENTS: decreased knowledge of condition, decreased mobility, decreased ROM, decreased strength, hypomobility, increased fascial restrictions, impaired perceived functional ability, increased muscle spasms, impaired flexibility, impaired UE functional use, improper body mechanics, postural dysfunction, and pain.   ACTIVITY LIMITATIONS: carrying, lifting, bending, reach over head, and caring for others  PARTICIPATION LIMITATIONS: meal prep, cleaning, laundry, driving, shopping, community activity, occupation,  and yard work  PERSONAL FACTORS: Age, Past/current experiences, Profession, Time since onset of injury/illness/exacerbation, and 3+ comorbidities: alopecia, DM, ED, HA, HLD, MI, stroke x 3, substance abuse, Type II DM, and HTN.  are also affecting patient's functional outcome.   REHAB POTENTIAL: Good  CLINICAL DECISION MAKING: Evolving/moderate complexity  EVALUATION  COMPLEXITY: Moderate   GOALS: Goals reviewed with patient? Yes  SHORT TERM GOALS: Target date: 12/04/2022    Patient will be independent in home exercise program to improve strength/mobility for better functional independence with ADLs. Baseline: Goal status: INITIAL    LONG TERM GOALS: Target date: 01/01/2023    Patient will increase FOTO score to equal to or greater than   72  to demonstrate statistically significant improvement in mobility and quality of life.  Baseline: 8/6 : 67%  Goal status: INITIAL  2.  Patient will report a worst pain of 3/10 on VAS in  L shoulder  to improve tolerance with work ,driving, and reduced symptoms with activities.  Baseline: 8/6: 6/10  Goal status: INITIAL  3.  Patient will improve shoulder AROM to > 140 degrees of flexion, scaption, and abduction for improved ability to perform overhead activities. Baseline: 8/6: see above  Goal status: INITIAL  4.  Patient will tolerate sitting unsupported demonstrating erect sitting posture with minimal thoracic kyphosis for 15+ minutes to demonstrate improved back extensor strength and improved sitting tolerance.  Baseline: 8/6: forward head, rounded shoulders   Goal status: INITIAL     PLAN:  PT FREQUENCY: 2x/week  PT DURATION: 8 weeks  PLANNED INTERVENTIONS: Therapeutic exercises, Therapeutic activity, Neuromuscular re-education, Balance training, Gait training, Patient/Family education, Self Care, Joint mobilization, Vestibular training, Canalith repositioning, Visual/preceptual remediation/compensation, Dry Needling, Electrical stimulation, Spinal manipulation, Cryotherapy, Moist heat, Taping, Traction, Manual therapy, and Re-evaluation  PLAN FOR NEXT SESSION: posture training, reduce tension of upper trap, teach home E stim unit if he brings it.    Precious Bard, PT 11/06/2022, 4:40 PM

## 2022-11-06 ENCOUNTER — Ambulatory Visit: Payer: Commercial Managed Care - PPO | Attending: Student

## 2022-11-06 DIAGNOSIS — R4701 Aphasia: Secondary | ICD-10-CM | POA: Diagnosis not present

## 2022-11-06 DIAGNOSIS — R293 Abnormal posture: Secondary | ICD-10-CM | POA: Diagnosis not present

## 2022-11-06 DIAGNOSIS — M25512 Pain in left shoulder: Secondary | ICD-10-CM | POA: Insufficient documentation

## 2022-11-08 NOTE — Therapy (Signed)
OUTPATIENT PHYSICAL THERAPY SHOULDER TREATMENT   Patient Name: Karl Morgan MRN: [id number] DOB:December 25, 1960, 62 y.o., male Today's Date: 11/12/2022  END OF SESSION:  PT End of Session - 11/12/22 1058     Visit Number 2    Number of Visits 16    Date for PT Re-Evaluation 01/01/23    Authorization Type 2/10    PT Start Time 1100    PT Stop Time 1144    PT Time Calculation (min) 44 min    Activity Tolerance Patient tolerated treatment well    Behavior During Therapy [facility] for tasks assessed/performed              Past Medical History:  Diagnosis Date   Alopecia 04/04/2011   Diabetes mellitus    Erectile dysfunction 04/04/2011   Heart attack (HCC) 10/07/2014   Overview:  Status post STEMI, anterior myocardial infarction status post DES stent x 2 to the LAD, mid and distal.    Heart murmur    Hyperlipidemia 04/04/2011   Hypogonadism male 04/04/2011   Myocardial infarction [facility])    Stroke (HCC)    x 3 as of 05/2021 last stroke 12/17/20 with aphasia   Substance abuse (HCC)    Type II or unspecified type diabetes mellitus without mention of complication, uncontrolled 04/04/2011   Unspecified essential hypertension 04/04/2011   Past Surgical History:  Procedure Laterality Date   BUBBLE STUDY  08/03/2019   Procedure: BUBBLE STUDY;  Surgeon: Pricilla Riffle, MD;  Location: [facility] ENDOSCOPY;  Service: Cardiovascular;;   CARDIAC CATHETERIZATION     IR ANGIO INTRA EXTRACRAN SEL COM CAROTID INNOMINATE UNI L MOD SED  12/17/2020   IR CT HEAD LTD  12/17/2020   IR PERCUTANEOUS ART THROMBECTOMY/INFUSION INTRACRANIAL INC DIAG ANGIO  12/17/2020   IR US GUIDE VASC ACCESS RIGHT  12/17/2020   LOOP RECORDER INSERTION N/A 08/03/2019   Procedure: LOOP RECORDER INSERTION;  Surgeon: Marinus Maw, MD;  Location: [facility] INVASIVE CV LAB;  Service: Cardiovascular;  Laterality: N/A;   RADIOLOGY WITH ANESTHESIA N/A 12/17/2020   Procedure: IR WITH ANESTHESIA;  Surgeon: Julieanne Cotton, MD;  Location: [facility] OR;   Service: Radiology;  Laterality: N/A;   TEE WITHOUT CARDIOVERSION N/A 08/03/2019   Procedure: TRANSESOPHAGEAL ECHOCARDIOGRAM (TEE);  Surgeon: Pricilla Riffle, MD;  Location: [facility] ENDOSCOPY;  Service: Cardiovascular;  Laterality: N/A;   Patient Active Problem List   Diagnosis Date Noted   Diastasis of rectus abdominis 12/21/2021   Aphasia 06/21/2021   Need for vaccination 01/18/2021   Elevation of level of transaminase or lactic acid dehydrogenase (LDH) 01/18/2021   Elevated WBC count 01/18/2021   Chest pain 01/18/2021   Acute inferior myocardial infarction (HCC) 01/18/2021   ICH (intracerebral hemorrhage) (HCC) 12/31/2020   Left ventricular apical thrombus    Hypertension associated with diabetes (HCC) 11/08/2020   Gallbladder polyp 11/12/2019   Fatty liver 11/12/2019   Bilateral carotid artery stenosis 08/06/2019   Mitral valve insufficiency 08/06/2019   Cerebrovascular accident (CVA) due to embolism of cerebral artery (HCC) 08/06/2019   Intracranial atherosclerosis 08/06/2019   Arthritis 10/21/2018   Obesity (BMI 30-39.9) 08/22/2017   Herpes simplex type 1 infection 02/21/2015   Recurrent oral herpes simplex infection 02/21/2015   History of stroke 02/16/2015   Arteriosclerosis of coronary artery 10/07/2014   Hypotestosteronism 10/07/2014   B12 deficiency 12/18/2011   Hypothyroidism 05/04/2011   Annual physical exam 04/05/2011   Diabetes mellitus type 2, controlled, with complications (HCC) 04/04/2011   Essential hypertension 04/04/2011  Hyperlipidemia 04/04/2011   Hypogonadism male 04/04/2011   Alopecia 04/04/2011   Erectile dysfunction 04/04/2011    PCP: Dana Allan MD  REFERRING PROVIDER: Dana Allan MD  REFERRING DIAG: strain on L shoulder, spondylosis without myelopathy cervical region, other shoulder lesions L shoulder, lateral epicondylitis L elbow   THERAPY DIAG:  Acute pain of left shoulder  Abnormal posture  Rationale for Evaluation and Treatment:  Rehabilitation  ONSET DATE: 3 weeks ago   SUBJECTIVE:                                                                                                                                                                                      SUBJECTIVE STATEMENT: Patient presents for L shoulder pain. Has been compliant with HEP.  Hand dominance: Left  PERTINENT HISTORY: Patient presnts for L shoulder pain and spondylosis of cervical region with additional left elbow lateral epicondylitis. PMH includes alopecia, DM, ED, HA, HLD, MI, stroke x 3, substance abuse, Type II DM, and HTN. Patient drives 150 miles a day. Patient had an injection last Friday and has had a cortisone injection. Has been having pain for over three weeks.  Pain radiates to elbow.   PAIN:  Are you having pain? Yes: NPRS scale: 2/10 Pain location: L upper trap to Forearm  Pain description: throbby  Aggravating factors: driving Relieving factors: massage  Worst pain: 6/10 Least pain: 2/10   PRECAUTIONS: None  RED FLAGS: None  WEIGHT BEARING RESTRICTIONS: No  FALLS:  Has patient fallen in last 6 months? No  LIVING ENVIRONMENT: Lives with: lives with their spouse Lives in: House/apartment  Has following equipment at home: None  OCCUPATION: Driving long distances, at desk, leaning forward,   PLOF: Independent  PATIENT GOALS:to decrease pain  NEXT MD VISIT:   OBJECTIVE:   DIAGNOSTIC FINDINGS:  Unable to be seen via [facility] system, in [place] system   PATIENT SURVEYS:  FOTO 67  COGNITION: Overall cognitive status: Within functional limits for tasks assessed      POSTURE: Forward head, rounded shoulders  UPPER EXTREMITY ROM:   Active ROM Right eval Left eval  Shoulder flexion 140 128  Shoulder extension 90 43  Shoulder abduction 145 115  (Blank rows = not tested)  UPPER EXTREMITY MMT:  MMT Right eval Left eval  Shoulder flexion 5 4  Shoulder extension 5 4  Shoulder abduction 5 4-*   Elbow flexion 5 4  Elbow extension 5 4  (Blank rows = not tested)  SHOULDER SPECIAL TESTS: Impingement tests: Neer impingement test: positive , Hawkins/Kennedy impingement test: positive , and Painful arc test: positive  SLAP lesions: Biceps load test: negative Instability tests:  Apprehension test: negative and Sulcus sign: negative Rotator cuff assessment: Full can test: positive for pain Biceps assessment: Yergason's test: negative  JOINT MOBILITY TESTING:  Hypomobile painful with muscle guarding  PALPATION:  Tension of L upper trap, L subscap, L bicep; L wrist flexors and extensors.    TODAY'S TREATMENT:                                                                                                                                         DATE: 11/12/22 Manual: Bicep trigger point with movement L bicep x 4 minutes  STM with implementation of effleurage and ptrissage to L scapula and muscle attachments x 19 minutes    TherEx: Scapular stabilization 2x 30 seconds Scapular punches 10x GTB lat pull down 10x each side  Education on posture at home and driving.   Trigger Point Dry Needling (TDN), unbilled Education performed with patient regarding potential benefit of TDN. Reviewed precautions and risks with patient. Reviewed special precautions/risks over lung fields which include pneumothorax. Reviewed signs and symptoms of pneumothorax and advised pt to go to ER immediately if these symptoms develop advise them of dry needling treatment. Extensive time spent with pt to ensure full understanding of TDN risks. Pt provided verbal consent to treatment. TDN performed to  with 0.25 x 40 single needle placements with local twitch response (LTR). Pistoning technique utilized. Improved pain-free motion following intervention. L upper trap and wrist extensors/flexors x2 minutes     PATIENT EDUCATION: Education details: goals, POC, HEP Person educated: Patient Education method:  Explanation, Demonstration, Tactile cues, Verbal cues, and Handouts Education comprehension: verbalized understanding, returned demonstration, verbal cues required, tactile cues required, and needs further education  HOME EXERCISE PROGRAM: Access Code: [email or web address] URL: [email or web address] Date: 11/06/2022 Prepared by: Precious Bard  Exercises - Seated Scapular Retraction  - 1 x daily - 7 x weekly - 2 sets - 10 reps - 5 hold - Supine Chin Tuck  - 1 x daily - 7 x weekly - 2 sets - 10 reps - 5 hold - Seated Upper Trapezius Stretch  - 1 x daily - 7 x weekly - 2 sets - 2 reps - 30 hold  ASSESSMENT:  CLINICAL IMPRESSION: Patient has multiple larger trigger points of rhomboids and traps that is released with extensive STM. Patient reports decreased pain by end of session. Education on posture while driving and at home performed. Patient eager to progress functional pain free mobility.  Patient will retire in 7 weeks and would like to be pain free by retirement. Patient will benefit from skilled physical therapy to reduce pain and improve mobility.   OBJECTIVE IMPAIRMENTS: decreased knowledge of condition, decreased mobility, decreased ROM, decreased strength, hypomobility, increased fascial restrictions, impaired perceived functional ability, increased muscle spasms, impaired flexibility, impaired UE functional use, improper body mechanics, postural dysfunction, and pain.   ACTIVITY LIMITATIONS: carrying, lifting, bending, reach over head,  and caring for others  PARTICIPATION LIMITATIONS: meal prep, cleaning, laundry, driving, shopping, community activity, occupation, and yard work  PERSONAL FACTORS: Age, Past/current experiences, Profession, Time since onset of injury/illness/exacerbation, and 3+ comorbidities: alopecia, DM, ED, HA, HLD, MI, stroke x 3, substance abuse, Type II DM, and HTN.  are also affecting patient's functional outcome.   REHAB POTENTIAL: Good  CLINICAL DECISION  MAKING: Evolving/moderate complexity  EVALUATION COMPLEXITY: Moderate   GOALS: Goals reviewed with patient? Yes  SHORT TERM GOALS: Target date: 12/04/2022    Patient will be independent in home exercise program to improve strength/mobility for better functional independence with ADLs. Baseline: Goal status: INITIAL    LONG TERM GOALS: Target date: 01/01/2023    Patient will increase FOTO score to equal to or greater than   72  to demonstrate statistically significant improvement in mobility and quality of life.  Baseline: 8/6 : 67%  Goal status: INITIAL  2.  Patient will report a worst pain of 3/10 on VAS in  L shoulder  to improve tolerance with work ,driving, and reduced symptoms with activities.  Baseline: 8/6: 6/10  Goal status: INITIAL  3.  Patient will improve shoulder AROM to > 140 degrees of flexion, scaption, and abduction for improved ability to perform overhead activities. Baseline: 8/6: see above  Goal status: INITIAL  4.  Patient will tolerate sitting unsupported demonstrating erect sitting posture with minimal thoracic kyphosis for 15+ minutes to demonstrate improved back extensor strength and improved sitting tolerance.  Baseline: 8/6: forward head, rounded shoulders   Goal status: INITIAL     PLAN:  PT FREQUENCY: 2x/week  PT DURATION: 8 weeks  PLANNED INTERVENTIONS: Therapeutic exercises, Therapeutic activity, Neuromuscular re-education, Balance training, Gait training, Patient/Family education, Self Care, Joint mobilization, Vestibular training, Canalith repositioning, Visual/preceptual remediation/compensation, Dry Needling, Electrical stimulation, Spinal manipulation, Cryotherapy, Moist heat, Taping, Traction, Manual therapy, and Re-evaluation  PLAN FOR NEXT SESSION: posture training, reduce tension of upper trap, teach home E stim unit if he brings it.    Precious Bard, PT 11/12/2022, 11:46 AM

## 2022-11-12 ENCOUNTER — Ambulatory Visit: Payer: Commercial Managed Care - PPO

## 2022-11-12 DIAGNOSIS — R293 Abnormal posture: Secondary | ICD-10-CM | POA: Diagnosis not present

## 2022-11-12 DIAGNOSIS — R4701 Aphasia: Secondary | ICD-10-CM | POA: Diagnosis not present

## 2022-11-12 DIAGNOSIS — M25512 Pain in left shoulder: Secondary | ICD-10-CM | POA: Diagnosis not present

## 2022-11-13 NOTE — Therapy (Signed)
OUTPATIENT PHYSICAL THERAPY SHOULDER TREATMENT   Patient Name: Karl Morgan MRN: [id number] DOB:1960/05/25, 62 y.o., male Today's Date: 11/14/2022  END OF SESSION:  PT End of Session - 11/14/22 1101     Visit Number 3    Number of Visits 16    Date for PT Re-Evaluation 01/01/23    Authorization Type 3/10    PT Start Time 1101    PT Stop Time 1143    PT Time Calculation (min) 42 min    Activity Tolerance Patient tolerated treatment well    Behavior During Therapy [facility] for tasks assessed/performed               Past Medical History:  Diagnosis Date   Alopecia 04/04/2011   Diabetes mellitus    Erectile dysfunction 04/04/2011   Heart attack (HCC) 10/07/2014   Overview:  Status post STEMI, anterior myocardial infarction status post DES stent x 2 to the LAD, mid and distal.    Heart murmur    Hyperlipidemia 04/04/2011   Hypogonadism male 04/04/2011   Myocardial infarction [facility])    Stroke (HCC)    x 3 as of 05/2021 last stroke 12/17/20 with aphasia   Substance abuse (HCC)    Type II or unspecified type diabetes mellitus without mention of complication, uncontrolled 04/04/2011   Unspecified essential hypertension 04/04/2011   Past Surgical History:  Procedure Laterality Date   BUBBLE STUDY  08/03/2019   Procedure: BUBBLE STUDY;  Surgeon: Pricilla Riffle, MD;  Location: [facility] ENDOSCOPY;  Service: Cardiovascular;;   CARDIAC CATHETERIZATION     IR ANGIO INTRA EXTRACRAN SEL COM CAROTID INNOMINATE UNI L MOD SED  12/17/2020   IR CT HEAD LTD  12/17/2020   IR PERCUTANEOUS ART THROMBECTOMY/INFUSION INTRACRANIAL INC DIAG ANGIO  12/17/2020   IR US GUIDE VASC ACCESS RIGHT  12/17/2020   LOOP RECORDER INSERTION N/A 08/03/2019   Procedure: LOOP RECORDER INSERTION;  Surgeon: Marinus Maw, MD;  Location: [facility] INVASIVE CV LAB;  Service: Cardiovascular;  Laterality: N/A;   RADIOLOGY WITH ANESTHESIA N/A 12/17/2020   Procedure: IR WITH ANESTHESIA;  Surgeon: Julieanne Cotton, MD;  Location: [facility] OR;   Service: Radiology;  Laterality: N/A;   TEE WITHOUT CARDIOVERSION N/A 08/03/2019   Procedure: TRANSESOPHAGEAL ECHOCARDIOGRAM (TEE);  Surgeon: Pricilla Riffle, MD;  Location: [facility] ENDOSCOPY;  Service: Cardiovascular;  Laterality: N/A;   Patient Active Problem List   Diagnosis Date Noted   Diastasis of rectus abdominis 12/21/2021   Aphasia 06/21/2021   Need for vaccination 01/18/2021   Elevation of level of transaminase or lactic acid dehydrogenase (LDH) 01/18/2021   Elevated WBC count 01/18/2021   Chest pain 01/18/2021   Acute inferior myocardial infarction (HCC) 01/18/2021   ICH (intracerebral hemorrhage) (HCC) 12/31/2020   Left ventricular apical thrombus    Hypertension associated with diabetes (HCC) 11/08/2020   Gallbladder polyp 11/12/2019   Fatty liver 11/12/2019   Bilateral carotid artery stenosis 08/06/2019   Mitral valve insufficiency 08/06/2019   Cerebrovascular accident (CVA) due to embolism of cerebral artery (HCC) 08/06/2019   Intracranial atherosclerosis 08/06/2019   Arthritis 10/21/2018   Obesity (BMI 30-39.9) 08/22/2017   Herpes simplex type 1 infection 02/21/2015   Recurrent oral herpes simplex infection 02/21/2015   History of stroke 02/16/2015   Arteriosclerosis of coronary artery 10/07/2014   Hypotestosteronism 10/07/2014   B12 deficiency 12/18/2011   Hypothyroidism 05/04/2011   Annual physical exam 04/05/2011   Diabetes mellitus type 2, controlled, with complications (HCC) 04/04/2011   Essential hypertension  04/04/2011   Hyperlipidemia 04/04/2011   Hypogonadism male 04/04/2011   Alopecia 04/04/2011   Erectile dysfunction 04/04/2011    PCP: Dana Allan MD  REFERRING PROVIDER: Dana Allan MD  REFERRING DIAG: strain on L shoulder, spondylosis without myelopathy cervical region, other shoulder lesions L shoulder, lateral epicondylitis L elbow   THERAPY DIAG:  Acute pain of left shoulder  Abnormal posture  Rationale for Evaluation and Treatment:  Rehabilitation  ONSET DATE: 3 weeks ago   SUBJECTIVE:                                                                                                                                                                                      SUBJECTIVE STATEMENT: Patient reports he is doing well, having some improvements.  Hand dominance: Left  PERTINENT HISTORY: Patient presnts for L shoulder pain and spondylosis of cervical region with additional left elbow lateral epicondylitis. PMH includes alopecia, DM, ED, HA, HLD, MI, stroke x 3, substance abuse, Type II DM, and HTN. Patient drives 150 miles a day. Patient had an injection last Friday and has had a cortisone injection. Has been having pain for over three weeks.  Pain radiates to elbow.   PAIN:  Are you having pain? Yes: NPRS scale: 2/10 Pain location: L upper trap to Forearm  Pain description: throbby  Aggravating factors: driving Relieving factors: massage  Worst pain: 6/10 Least pain: 2/10   PRECAUTIONS: None  RED FLAGS: None  WEIGHT BEARING RESTRICTIONS: No  FALLS:  Has patient fallen in last 6 months? No  LIVING ENVIRONMENT: Lives with: lives with their spouse Lives in: House/apartment  Has following equipment at home: None  OCCUPATION: Driving long distances, at desk, leaning forward,   PLOF: Independent  PATIENT GOALS:to decrease pain  NEXT MD VISIT:   OBJECTIVE:   DIAGNOSTIC FINDINGS:  Unable to be seen via [facility] system, in [place] system   PATIENT SURVEYS:  FOTO 67  COGNITION: Overall cognitive status: Within functional limits for tasks assessed      POSTURE: Forward head, rounded shoulders  UPPER EXTREMITY ROM:   Active ROM Right eval Left eval  Shoulder flexion 140 128  Shoulder extension 90 43  Shoulder abduction 145 115  (Blank rows = not tested)  UPPER EXTREMITY MMT:  MMT Right eval Left eval  Shoulder flexion 5 4  Shoulder extension 5 4  Shoulder abduction 5 4-*  Elbow  flexion 5 4  Elbow extension 5 4  (Blank rows = not tested)  SHOULDER SPECIAL TESTS: Impingement tests: Neer impingement test: positive , Hawkins/Kennedy impingement test: positive , and Painful arc test: positive  SLAP lesions: Biceps load test: negative Instability  tests: Apprehension test: negative and Sulcus sign: negative Rotator cuff assessment: Full can test: positive for pain Biceps assessment: Yergason's test: negative  JOINT MOBILITY TESTING:  Hypomobile painful with muscle guarding  PALPATION:  Tension of L upper trap, L subscap, L bicep; L wrist flexors and extensors.    TODAY'S TREATMENT:                                                                                                                                         DATE: 11/14/22 Manual: STM with implementation of effleurage and ptrissage to L scapula and muscle attachments , L bicep x 23 minutes    TherEx: Review new HEP: Access Code: [email or web address] URL: [email or web address] Date: 11/14/2022 Prepared by: Precious Bard  Exercises - Seated Scapular Retraction  - 1 x daily - 7 x weekly - 2 sets - 10 reps - 5 hold - Supine Chin Tuck  - 1 x daily - 7 x weekly - 2 sets - 10 reps - 5 hold - Seated Upper Trapezius Stretch  - 1 x daily - 7 x weekly - 2 sets - 2 reps - 30 hold - Corner Pec Major Stretch  - 1 x daily - 7 x weekly - 2 sets - 2 reps - 30 hold - Cervical Retraction at Wall  - 1 x daily - 7 x weekly - 2 sets - 10 reps - 5 hold - Wall Angels  - 1 x daily - 7 x weekly - 2 sets - 10 reps - 5 hold - Shoulder Flexion at Wall  - 1 x daily - 7 x weekly - 2 sets - 10 reps - 5 hold     PATIENT EDUCATION: Education details: goals, POC, HEP Person educated: Patient Education method: Explanation, Demonstration, Tactile cues, Verbal cues, and Handouts Education comprehension: verbalized understanding, returned demonstration, verbal cues required, tactile cues required, and needs further  education  HOME EXERCISE PROGRAM: Access Code: [email or web address] URL: [email or web address] Date: 11/06/2022 Prepared by: Precious Bard  Exercises - Seated Scapular Retraction  - 1 x daily - 7 x weekly - 2 sets - 10 reps - 5 hold - Supine Chin Tuck  - 1 x daily - 7 x weekly - 2 sets - 10 reps - 5 hold - Seated Upper Trapezius Stretch  - 1 x daily - 7 x weekly - 2 sets - 2 reps - 30 hold  Access Code: [email or web address] URL: [email or web address] Date: 11/14/2022 Prepared by: Precious Bard  Exercises - Seated Scapular Retraction  - 1 x daily - 7 x weekly - 2 sets - 10 reps - 5 hold - Supine Chin Tuck  - 1 x daily - 7 x weekly - 2 sets - 10 reps - 5 hold - Seated Upper Trapezius Stretch  - 1 x daily - 7 x weekly - 2 sets - 2 reps - 30 hold - Corner Pec Major Stretch  -  1 x daily - 7 x weekly - 2 sets - 2 reps - 30 hold - Cervical Retraction at Wall  - 1 x daily - 7 x weekly - 2 sets - 10 reps - 5 hold - Wall Angels  - 1 x daily - 7 x weekly - 2 sets - 10 reps - 5 hold - Shoulder Flexion at Wall  - 1 x daily - 7 x weekly - 2 sets - 10 reps - 5 hold  ASSESSMENT:  CLINICAL IMPRESSION: Patient tolerates progression of HEP program. He is highly motivated for pain regression strategies. He has significant thoracic kyphosis and forward head rounded shoulders which can be seen in wall posture exercises. Patient has improved posture by end of session indicating improved alignment.  Patient will benefit from skilled physical therapy to reduce pain and improve mobility.   OBJECTIVE IMPAIRMENTS: decreased knowledge of condition, decreased mobility, decreased ROM, decreased strength, hypomobility, increased fascial restrictions, impaired perceived functional ability, increased muscle spasms, impaired flexibility, impaired UE functional use, improper body mechanics, postural dysfunction, and pain.   ACTIVITY LIMITATIONS: carrying, lifting, bending, reach over head, and caring for  others  PARTICIPATION LIMITATIONS: meal prep, cleaning, laundry, driving, shopping, community activity, occupation, and yard work  PERSONAL FACTORS: Age, Past/current experiences, Profession, Time since onset of injury/illness/exacerbation, and 3+ comorbidities: alopecia, DM, ED, HA, HLD, MI, stroke x 3, substance abuse, Type II DM, and HTN.  are also affecting patient's functional outcome.   REHAB POTENTIAL: Good  CLINICAL DECISION MAKING: Evolving/moderate complexity  EVALUATION COMPLEXITY: Moderate   GOALS: Goals reviewed with patient? Yes  SHORT TERM GOALS: Target date: 12/04/2022    Patient will be independent in home exercise program to improve strength/mobility for better functional independence with ADLs. Baseline: Goal status: INITIAL    LONG TERM GOALS: Target date: 01/01/2023    Patient will increase FOTO score to equal to or greater than   72  to demonstrate statistically significant improvement in mobility and quality of life.  Baseline: 8/6 : 67%  Goal status: INITIAL  2.  Patient will report a worst pain of 3/10 on VAS in  L shoulder  to improve tolerance with work ,driving, and reduced symptoms with activities.  Baseline: 8/6: 6/10  Goal status: INITIAL  3.  Patient will improve shoulder AROM to > 140 degrees of flexion, scaption, and abduction for improved ability to perform overhead activities. Baseline: 8/6: see above  Goal status: INITIAL  4.  Patient will tolerate sitting unsupported demonstrating erect sitting posture with minimal thoracic kyphosis for 15+ minutes to demonstrate improved back extensor strength and improved sitting tolerance.  Baseline: 8/6: forward head, rounded shoulders   Goal status: INITIAL     PLAN:  PT FREQUENCY: 2x/week  PT DURATION: 8 weeks  PLANNED INTERVENTIONS: Therapeutic exercises, Therapeutic activity, Neuromuscular re-education, Balance training, Gait training, Patient/Family education, Self Care, Joint  mobilization, Vestibular training, Canalith repositioning, Visual/preceptual remediation/compensation, Dry Needling, Electrical stimulation, Spinal manipulation, Cryotherapy, Moist heat, Taping, Traction, Manual therapy, and Re-evaluation  PLAN FOR NEXT SESSION: posture training, reduce tension of upper trap, teach home E stim unit if he brings it.    Precious Bard, PT 11/14/2022, 12:45 PM

## 2022-11-14 ENCOUNTER — Ambulatory Visit: Payer: Commercial Managed Care - PPO

## 2022-11-14 DIAGNOSIS — M25512 Pain in left shoulder: Secondary | ICD-10-CM | POA: Diagnosis not present

## 2022-11-14 DIAGNOSIS — R293 Abnormal posture: Secondary | ICD-10-CM | POA: Diagnosis not present

## 2022-11-14 DIAGNOSIS — R4701 Aphasia: Secondary | ICD-10-CM | POA: Diagnosis not present

## 2022-11-15 ENCOUNTER — Encounter (INDEPENDENT_AMBULATORY_CARE_PROVIDER_SITE_OTHER): Payer: Self-pay

## 2022-11-19 ENCOUNTER — Ambulatory Visit: Payer: Commercial Managed Care - PPO

## 2022-11-19 DIAGNOSIS — M25512 Pain in left shoulder: Secondary | ICD-10-CM | POA: Diagnosis not present

## 2022-11-19 DIAGNOSIS — R4701 Aphasia: Secondary | ICD-10-CM

## 2022-11-19 DIAGNOSIS — R293 Abnormal posture: Secondary | ICD-10-CM | POA: Diagnosis not present

## 2022-11-19 NOTE — Therapy (Signed)
OUTPATIENT PHYSICAL THERAPY SHOULDER TREATMENT   Patient Name: Karl Morgan MRN: [id number] DOB:02-29-1961, 62 y.o., male Today's Date: 11/19/2022  END OF SESSION:   PT End of Session - 11/19/22 1457     Visit Number 4    Number of Visits 16    Date for PT Re-Evaluation 01/01/23    Authorization Type 4/10    PT Start Time 1555    PT Stop Time 1630    PT Time Calculation (min) 35 min    Activity Tolerance Patient tolerated treatment well    Behavior During Therapy [facility] for tasks assessed/performed                Past Medical History:  Diagnosis Date   Alopecia 04/04/2011   Diabetes mellitus    Erectile dysfunction 04/04/2011   Heart attack (HCC) 10/07/2014   Overview:  Status post STEMI, anterior myocardial infarction status post DES stent x 2 to the LAD, mid and distal.    Heart murmur    Hyperlipidemia 04/04/2011   Hypogonadism male 04/04/2011   Myocardial infarction [facility])    Stroke (HCC)    x 3 as of 05/2021 last stroke 12/17/20 with aphasia   Substance abuse (HCC)    Type II or unspecified type diabetes mellitus without mention of complication, uncontrolled 04/04/2011   Unspecified essential hypertension 04/04/2011   Past Surgical History:  Procedure Laterality Date   BUBBLE STUDY  08/03/2019   Procedure: BUBBLE STUDY;  Surgeon: Pricilla Riffle, MD;  Location: [facility] ENDOSCOPY;  Service: Cardiovascular;;   CARDIAC CATHETERIZATION     IR ANGIO INTRA EXTRACRAN SEL COM CAROTID INNOMINATE UNI L MOD SED  12/17/2020   IR CT HEAD LTD  12/17/2020   IR PERCUTANEOUS ART THROMBECTOMY/INFUSION INTRACRANIAL INC DIAG ANGIO  12/17/2020   IR US GUIDE VASC ACCESS RIGHT  12/17/2020   LOOP RECORDER INSERTION N/A 08/03/2019   Procedure: LOOP RECORDER INSERTION;  Surgeon: Marinus Maw, MD;  Location: [facility] INVASIVE CV LAB;  Service: Cardiovascular;  Laterality: N/A;   RADIOLOGY WITH ANESTHESIA N/A 12/17/2020   Procedure: IR WITH ANESTHESIA;  Surgeon: Julieanne Cotton, MD;  Location: [facility] OR;   Service: Radiology;  Laterality: N/A;   TEE WITHOUT CARDIOVERSION N/A 08/03/2019   Procedure: TRANSESOPHAGEAL ECHOCARDIOGRAM (TEE);  Surgeon: Pricilla Riffle, MD;  Location: [facility] ENDOSCOPY;  Service: Cardiovascular;  Laterality: N/A;   Patient Active Problem List   Diagnosis Date Noted   Diastasis of rectus abdominis 12/21/2021   Aphasia 06/21/2021   Need for vaccination 01/18/2021   Elevation of level of transaminase or lactic acid dehydrogenase (LDH) 01/18/2021   Elevated WBC count 01/18/2021   Chest pain 01/18/2021   Acute inferior myocardial infarction (HCC) 01/18/2021   ICH (intracerebral hemorrhage) (HCC) 12/31/2020   Left ventricular apical thrombus    Hypertension associated with diabetes (HCC) 11/08/2020   Gallbladder polyp 11/12/2019   Fatty liver 11/12/2019   Bilateral carotid artery stenosis 08/06/2019   Mitral valve insufficiency 08/06/2019   Cerebrovascular accident (CVA) due to embolism of cerebral artery (HCC) 08/06/2019   Intracranial atherosclerosis 08/06/2019   Arthritis 10/21/2018   Obesity (BMI 30-39.9) 08/22/2017   Herpes simplex type 1 infection 02/21/2015   Recurrent oral herpes simplex infection 02/21/2015   History of stroke 02/16/2015   Arteriosclerosis of coronary artery 10/07/2014   Hypotestosteronism 10/07/2014   B12 deficiency 12/18/2011   Hypothyroidism 05/04/2011   Annual physical exam 04/05/2011   Diabetes mellitus type 2, controlled, with complications (HCC) 04/04/2011  Essential hypertension 04/04/2011   Hyperlipidemia 04/04/2011   Hypogonadism male 04/04/2011   Alopecia 04/04/2011   Erectile dysfunction 04/04/2011    PCP: Dana Allan MD  REFERRING PROVIDER: Dana Allan MD  REFERRING DIAG: strain on L shoulder, spondylosis without myelopathy cervical region, other shoulder lesions L shoulder, lateral epicondylitis L elbow   THERAPY DIAG:  Acute pain of left shoulder  Abnormal posture  Aphasia  Rationale for Evaluation and  Treatment: Rehabilitation  ONSET DATE: 3 weeks ago   SUBJECTIVE:                                                                                                                                                                                      SUBJECTIVE STATEMENT:  Pt reports things are better than when they started, but it's still aggravating as hell.    Hand dominance: Left  PERTINENT HISTORY: Patient presnts for L shoulder pain and spondylosis of cervical region with additional left elbow lateral epicondylitis. PMH includes alopecia, DM, ED, HA, HLD, MI, stroke x 3, substance abuse, Type II DM, and HTN. Patient drives 150 miles a day. Patient had an injection last Friday and has had a cortisone injection. Has been having pain for over three weeks.  Pain radiates to elbow.   PAIN:  Are you having pain? Yes: NPRS scale: 2-3/10 Pain location: L upper trap to Forearm  Pain description: throbby  Aggravating factors: driving Relieving factors: massage  Worst pain: 6/10 Least pain: 2/10   PRECAUTIONS: None  RED FLAGS: None  WEIGHT BEARING RESTRICTIONS: No  FALLS:  Has patient fallen in last 6 months? No  LIVING ENVIRONMENT: Lives with: lives with their spouse Lives in: House/apartment  Has following equipment at home: None  OCCUPATION: Driving long distances, at desk, leaning forward,   PLOF: Independent  PATIENT GOALS:to decrease pain  NEXT MD VISIT:   OBJECTIVE:   DIAGNOSTIC FINDINGS:  Unable to be seen via [facility] system, in [place] system   PATIENT SURVEYS:  FOTO 67  COGNITION: Overall cognitive status: Within functional limits for tasks assessed      POSTURE: Forward head, rounded shoulders  UPPER EXTREMITY ROM:   Active ROM Right eval Left eval  Shoulder flexion 140 128  Shoulder extension 90 43  Shoulder abduction 145 115  (Blank rows = not tested)  UPPER EXTREMITY MMT:  MMT Right eval Left eval  Shoulder flexion 5 4  Shoulder  extension 5 4  Shoulder abduction 5 4-*  Elbow flexion 5 4  Elbow extension 5 4  (Blank rows = not tested)  SHOULDER SPECIAL TESTS: Impingement tests: Neer impingement test: positive , Hawkins/Kennedy impingement test: positive ,  and Painful arc test: positive  SLAP lesions: Biceps load test: negative Instability tests: Apprehension test: negative and Sulcus sign: negative Rotator cuff assessment: Full can test: positive for pain Biceps assessment: Yergason's test: negative  JOINT MOBILITY TESTING:  Hypomobile painful with muscle guarding  PALPATION:  Tension of L upper trap, L subscap, L bicep; L wrist flexors and extensors.    TODAY'S TREATMENT: DATE: 11/19/22   Manual:  Bicep trigger point with movement L bicep x 4 minutes  STM with implementation of effleurage and ptrissage to L scapula and muscle attachments x 19 minutes     TherEx:  Scapular stabilization 2x 30 seconds Scapular punches 10x Prone scapular retractions, 2x10 Standing lower trap setting at wall, 2x10 Prone scapular retraction Y's, 2x10    Trigger Point Dry Needling (TDN), unbilled Education performed with patient regarding potential benefit of TDN. Reviewed precautions and risks with patient. Reviewed special precautions/risks over lung fields which include pneumothorax. Reviewed signs and symptoms of pneumothorax and advised pt to go to ER immediately if these symptoms develop advise them of dry needling treatment. Extensive time spent with pt to ensure full understanding of TDN risks. Pt provided verbal consent to treatment. TDN performed to  with 0.25 x 40 single needle placements with local twitch response (LTR). Pistoning technique utilized. Improved pain-free motion following intervention. L upper trap and L infraspinatus x2 minutes    PATIENT EDUCATION: Education details: goals, POC, HEP Person educated: Patient Education method: Explanation, Demonstration, Tactile cues, Verbal cues, and  Handouts Education comprehension: verbalized understanding, returned demonstration, verbal cues required, tactile cues required, and needs further education  HOME EXERCISE PROGRAM: Access Code: [email or web address] URL: [email or web address] Date: 11/06/2022 Prepared by: Precious Bard  Exercises - Seated Scapular Retraction  - 1 x daily - 7 x weekly - 2 sets - 10 reps - 5 hold - Supine Chin Tuck  - 1 x daily - 7 x weekly - 2 sets - 10 reps - 5 hold - Seated Upper Trapezius Stretch  - 1 x daily - 7 x weekly - 2 sets - 2 reps - 30 hold  Access Code: [email or web address] URL: [email or web address] Date: 11/14/2022 Prepared by: Precious Bard  Exercises - Seated Scapular Retraction  - 1 x daily - 7 x weekly - 2 sets - 10 reps - 5 hold - Supine Chin Tuck  - 1 x daily - 7 x weekly - 2 sets - 10 reps - 5 hold - Seated Upper Trapezius Stretch  - 1 x daily - 7 x weekly - 2 sets - 2 reps - 30 hold - Corner Pec Major Stretch  - 1 x daily - 7 x weekly - 2 sets - 2 reps - 30 hold - Cervical Retraction at Wall  - 1 x daily - 7 x weekly - 2 sets - 10 reps - 5 hold - Wall Angels  - 1 x daily - 7 x weekly - 2 sets - 10 reps - 5 hold - Shoulder Flexion at Wall  - 1 x daily - 7 x weekly - 2 sets - 10 reps - 5 hold  ASSESSMENT:  CLINICAL IMPRESSION:  Pt responded well to the introduced exercises today as well as the manual therapy approach including the dry needling.  Pt did have noticeable trigger points that were released and pt educated on drinking water to assist with reduced soreness.  Pt continues to demonstrate good strength, however does have some weakness in the lower traps that prevent adequate posture when in  standing and sitting position.   Pt will continue to benefit from skilled therapy to address remaining deficits in order to improve overall QoL and return to PLOF.     OBJECTIVE IMPAIRMENTS: decreased knowledge of condition, decreased mobility, decreased ROM, decreased strength,  hypomobility, increased fascial restrictions, impaired perceived functional ability, increased muscle spasms, impaired flexibility, impaired UE functional use, improper body mechanics, postural dysfunction, and pain.   ACTIVITY LIMITATIONS: carrying, lifting, bending, reach over head, and caring for others  PARTICIPATION LIMITATIONS: meal prep, cleaning, laundry, driving, shopping, community activity, occupation, and yard work  PERSONAL FACTORS: Age, Past/current experiences, Profession, Time since onset of injury/illness/exacerbation, and 3+ comorbidities: alopecia, DM, ED, HA, HLD, MI, stroke x 3, substance abuse, Type II DM, and HTN.  are also affecting patient's functional outcome.   REHAB POTENTIAL: Good  CLINICAL DECISION MAKING: Evolving/moderate complexity  EVALUATION COMPLEXITY: Moderate   GOALS: Goals reviewed with patient? Yes  SHORT TERM GOALS: Target date: 12/04/2022  Patient will be independent in home exercise program to improve strength/mobility for better functional independence with ADLs. Baseline: Goal status: INITIAL    LONG TERM GOALS: Target date: 01/01/2023  Patient will increase FOTO score to equal to or greater than   72  to demonstrate statistically significant improvement in mobility and quality of life.  Baseline: 8/6 : 67%  Goal status: INITIAL  2.  Patient will report a worst pain of 3/10 on VAS in  L shoulder  to improve tolerance with work ,driving, and reduced symptoms with activities.  Baseline: 8/6: 6/10  Goal status: INITIAL  3.  Patient will improve shoulder AROM to > 140 degrees of flexion, scaption, and abduction for improved ability to perform overhead activities. Baseline: 8/6: see above  Goal status: INITIAL  4.  Patient will tolerate sitting unsupported demonstrating erect sitting posture with minimal thoracic kyphosis for 15+ minutes to demonstrate improved back extensor strength and improved sitting tolerance.  Baseline: 8/6: forward  head, rounded shoulders   Goal status: INITIAL     PLAN:  PT FREQUENCY: 2x/week  PT DURATION: 8 weeks  PLANNED INTERVENTIONS: Therapeutic exercises, Therapeutic activity, Neuromuscular re-education, Balance training, Gait training, Patient/Family education, Self Care, Joint mobilization, Vestibular training, Canalith repositioning, Visual/preceptual remediation/compensation, Dry Needling, Electrical stimulation, Spinal manipulation, Cryotherapy, Moist heat, Taping, Traction, Manual therapy, and Re-evaluation  PLAN FOR NEXT SESSION: continue with postural stability exercises and DN as appropriate.   Phineas Real, PT 11/19/2022, 2:57 PM

## 2022-11-20 NOTE — Therapy (Signed)
OUTPATIENT PHYSICAL THERAPY SHOULDER TREATMENT   Patient Name: Karl Morgan MRN: [id number] DOB:1960-04-19, 62 y.o., male Today's Date: 11/21/2022  END OF SESSION:   PT End of Session - 11/21/22 1529     Visit Number 5    Number of Visits 16    Date for PT Re-Evaluation 01/01/23    Authorization Type 5/10    PT Start Time 1530    PT Stop Time 1614    PT Time Calculation (min) 44 min    Activity Tolerance Patient tolerated treatment well    Behavior During Therapy [facility] for tasks assessed/performed                 Past Medical History:  Diagnosis Date   Alopecia 04/04/2011   Diabetes mellitus    Erectile dysfunction 04/04/2011   Heart attack (HCC) 10/07/2014   Overview:  Status post STEMI, anterior myocardial infarction status post DES stent x 2 to the LAD, mid and distal.    Heart murmur    Hyperlipidemia 04/04/2011   Hypogonadism male 04/04/2011   Myocardial infarction [facility])    Stroke (HCC)    x 3 as of 05/2021 last stroke 12/17/20 with aphasia   Substance abuse (HCC)    Type II or unspecified type diabetes mellitus without mention of complication, uncontrolled 04/04/2011   Unspecified essential hypertension 04/04/2011   Past Surgical History:  Procedure Laterality Date   BUBBLE STUDY  08/03/2019   Procedure: BUBBLE STUDY;  Surgeon: Pricilla Riffle, MD;  Location: [facility] ENDOSCOPY;  Service: Cardiovascular;;   CARDIAC CATHETERIZATION     IR ANGIO INTRA EXTRACRAN SEL COM CAROTID INNOMINATE UNI L MOD SED  12/17/2020   IR CT HEAD LTD  12/17/2020   IR PERCUTANEOUS ART THROMBECTOMY/INFUSION INTRACRANIAL INC DIAG ANGIO  12/17/2020   IR US GUIDE VASC ACCESS RIGHT  12/17/2020   LOOP RECORDER INSERTION N/A 08/03/2019   Procedure: LOOP RECORDER INSERTION;  Surgeon: Marinus Maw, MD;  Location: [facility] INVASIVE CV LAB;  Service: Cardiovascular;  Laterality: N/A;   RADIOLOGY WITH ANESTHESIA N/A 12/17/2020   Procedure: IR WITH ANESTHESIA;  Surgeon: Julieanne Cotton, MD;  Location: [facility]  OR;  Service: Radiology;  Laterality: N/A;   TEE WITHOUT CARDIOVERSION N/A 08/03/2019   Procedure: TRANSESOPHAGEAL ECHOCARDIOGRAM (TEE);  Surgeon: Pricilla Riffle, MD;  Location: [facility] ENDOSCOPY;  Service: Cardiovascular;  Laterality: N/A;   Patient Active Problem List   Diagnosis Date Noted   Diastasis of rectus abdominis 12/21/2021   Aphasia 06/21/2021   Need for vaccination 01/18/2021   Elevation of level of transaminase or lactic acid dehydrogenase (LDH) 01/18/2021   Elevated WBC count 01/18/2021   Chest pain 01/18/2021   Acute inferior myocardial infarction (HCC) 01/18/2021   ICH (intracerebral hemorrhage) (HCC) 12/31/2020   Left ventricular apical thrombus    Hypertension associated with diabetes (HCC) 11/08/2020   Gallbladder polyp 11/12/2019   Fatty liver 11/12/2019   Bilateral carotid artery stenosis 08/06/2019   Mitral valve insufficiency 08/06/2019   Cerebrovascular accident (CVA) due to embolism of cerebral artery (HCC) 08/06/2019   Intracranial atherosclerosis 08/06/2019   Arthritis 10/21/2018   Obesity (BMI 30-39.9) 08/22/2017   Herpes simplex type 1 infection 02/21/2015   Recurrent oral herpes simplex infection 02/21/2015   History of stroke 02/16/2015   Arteriosclerosis of coronary artery 10/07/2014   Hypotestosteronism 10/07/2014   B12 deficiency 12/18/2011   Hypothyroidism 05/04/2011   Annual physical exam 04/05/2011   Diabetes mellitus type 2, controlled, with complications (HCC) 04/04/2011  Essential hypertension 04/04/2011   Hyperlipidemia 04/04/2011   Hypogonadism male 04/04/2011   Alopecia 04/04/2011   Erectile dysfunction 04/04/2011    PCP: Dana Allan MD  REFERRING PROVIDER: Dana Allan MD  REFERRING DIAG: strain on L shoulder, spondylosis without myelopathy cervical region, other shoulder lesions L shoulder, lateral epicondylitis L elbow   THERAPY DIAG:  Acute pain of left shoulder  Abnormal posture  Rationale for Evaluation and Treatment:  Rehabilitation  ONSET DATE: 3 weeks ago   SUBJECTIVE:                                                                                                                                                                                      SUBJECTIVE STATEMENT: Patient reports he is noticing an improvement each session   Hand dominance: Left  PERTINENT HISTORY: Patient presnts for L shoulder pain and spondylosis of cervical region with additional left elbow lateral epicondylitis. PMH includes alopecia, DM, ED, HA, HLD, MI, stroke x 3, substance abuse, Type II DM, and HTN. Patient drives 150 miles a day. Patient had an injection last Friday and has had a cortisone injection. Has been having pain for over three weeks.  Pain radiates to elbow.   PAIN:  Are you having pain? Yes: NPRS scale: 2-3/10 Pain location: L upper trap to Forearm  Pain description: throbby  Aggravating factors: driving Relieving factors: massage  Worst pain: 6/10 Least pain: 2/10   PRECAUTIONS: None  RED FLAGS: None  WEIGHT BEARING RESTRICTIONS: No  FALLS:  Has patient fallen in last 6 months? No  LIVING ENVIRONMENT: Lives with: lives with their spouse Lives in: House/apartment  Has following equipment at home: None  OCCUPATION: Driving long distances, at desk, leaning forward,   PLOF: Independent  PATIENT GOALS:to decrease pain  NEXT MD VISIT:   OBJECTIVE:   DIAGNOSTIC FINDINGS:  Unable to be seen via [facility] system, in [place] system   PATIENT SURVEYS:  FOTO 67  COGNITION: Overall cognitive status: Within functional limits for tasks assessed      POSTURE: Forward head, rounded shoulders  UPPER EXTREMITY ROM:   Active ROM Right eval Left eval  Shoulder flexion 140 128  Shoulder extension 90 43  Shoulder abduction 145 115  (Blank rows = not tested)  UPPER EXTREMITY MMT:  MMT Right eval Left eval  Shoulder flexion 5 4  Shoulder extension 5 4  Shoulder abduction 5 4-*  Elbow  flexion 5 4  Elbow extension 5 4  (Blank rows = not tested)  SHOULDER SPECIAL TESTS: Impingement tests: Neer impingement test: positive , Hawkins/Kennedy impingement test: positive , and Painful arc test: positive  SLAP lesions: Biceps load  test: negative Instability tests: Apprehension test: negative and Sulcus sign: negative Rotator cuff assessment: Full can test: positive for pain Biceps assessment: Yergason's test: negative  JOINT MOBILITY TESTING:  Hypomobile painful with muscle guarding  PALPATION:  Tension of L upper trap, L subscap, L bicep; L wrist flexors and extensors.    TODAY'S TREATMENT: DATE: 11/21/22   Manual:  STM with implementation of effleurage and ptrissage to L pectoral and anterior delt  muscle attachments x 14 minutes   AP mobilization to L shoulder 3x30 seconds   TherEx: GTB lat pulldown 10x   On the wall: -Standing lower trap setting at wall, 2x10 -Wall angels 10x  -chin tucks into wall 10x -PVC pipe overhead raise 10x  -three way pec stretch 30 seconds each position    Trigger Point Dry Needling (TDN), unbilled Education performed with patient regarding potential benefit of TDN. Reviewed precautions and risks with patient. Reviewed special precautions/risks over lung fields which include pneumothorax. Reviewed signs and symptoms of pneumothorax and advised pt to go to ER immediately if these symptoms develop advise them of dry needling treatment. Extensive time spent with pt to ensure full understanding of TDN risks. Pt provided verbal consent to treatment. TDN performed to  with 0.25 x 40 single needle placements with local twitch response (LTR). Pistoning technique utilized. Improved pain-free motion following intervention. L upper trap and  x2 minutes    PATIENT EDUCATION: Education details: goals, POC, HEP Person educated: Patient Education method: Explanation, Demonstration, Tactile cues, Verbal cues, and Handouts Education  comprehension: verbalized understanding, returned demonstration, verbal cues required, tactile cues required, and needs further education  HOME EXERCISE PROGRAM: Access Code: [email or web address] URL: [email or web address] Date: 11/06/2022 Prepared by: Precious Bard  Exercises - Seated Scapular Retraction  - 1 x daily - 7 x weekly - 2 sets - 10 reps - 5 hold - Supine Chin Tuck  - 1 x daily - 7 x weekly - 2 sets - 10 reps - 5 hold - Seated Upper Trapezius Stretch  - 1 x daily - 7 x weekly - 2 sets - 2 reps - 30 hold  Access Code: [email or web address] URL: [email or web address] Date: 11/14/2022 Prepared by: Precious Bard  Exercises - Seated Scapular Retraction  - 1 x daily - 7 x weekly - 2 sets - 10 reps - 5 hold - Supine Chin Tuck  - 1 x daily - 7 x weekly - 2 sets - 10 reps - 5 hold - Seated Upper Trapezius Stretch  - 1 x daily - 7 x weekly - 2 sets - 2 reps - 30 hold - Corner Pec Major Stretch  - 1 x daily - 7 x weekly - 2 sets - 2 reps - 30 hold - Cervical Retraction at Wall  - 1 x daily - 7 x weekly - 2 sets - 10 reps - 5 hold - Wall Angels  - 1 x daily - 7 x weekly - 2 sets - 10 reps - 5 hold - Shoulder Flexion at Wall  - 1 x daily - 7 x weekly - 2 sets - 10 reps - 5 hold  ASSESSMENT:  CLINICAL IMPRESSION:  Patient tolerates focused strengthening and postural exercises well with no pain increase. He is very challenged with overhead motions at the wall but is able to perform with increased effort. Pectoral stretch advanced to three positions. Patient remains highly motivated throughout session.   Pt will continue to benefit from skilled therapy to address remaining deficits in order to improve  overall QoL and return to PLOF.     OBJECTIVE IMPAIRMENTS: decreased knowledge of condition, decreased mobility, decreased ROM, decreased strength, hypomobility, increased fascial restrictions, impaired perceived functional ability, increased muscle spasms, impaired flexibility, impaired  UE functional use, improper body mechanics, postural dysfunction, and pain.   ACTIVITY LIMITATIONS: carrying, lifting, bending, reach over head, and caring for others  PARTICIPATION LIMITATIONS: meal prep, cleaning, laundry, driving, shopping, community activity, occupation, and yard work  PERSONAL FACTORS: Age, Past/current experiences, Profession, Time since onset of injury/illness/exacerbation, and 3+ comorbidities: alopecia, DM, ED, HA, HLD, MI, stroke x 3, substance abuse, Type II DM, and HTN.  are also affecting patient's functional outcome.   REHAB POTENTIAL: Good  CLINICAL DECISION MAKING: Evolving/moderate complexity  EVALUATION COMPLEXITY: Moderate   GOALS: Goals reviewed with patient? Yes  SHORT TERM GOALS: Target date: 12/04/2022  Patient will be independent in home exercise program to improve strength/mobility for better functional independence with ADLs. Baseline: Goal status: INITIAL    LONG TERM GOALS: Target date: 01/01/2023  Patient will increase FOTO score to equal to or greater than   72  to demonstrate statistically significant improvement in mobility and quality of life.  Baseline: 8/6 : 67%  Goal status: INITIAL  2.  Patient will report a worst pain of 3/10 on VAS in  L shoulder  to improve tolerance with work ,driving, and reduced symptoms with activities.  Baseline: 8/6: 6/10  Goal status: INITIAL  3.  Patient will improve shoulder AROM to > 140 degrees of flexion, scaption, and abduction for improved ability to perform overhead activities. Baseline: 8/6: see above  Goal status: INITIAL  4.  Patient will tolerate sitting unsupported demonstrating erect sitting posture with minimal thoracic kyphosis for 15+ minutes to demonstrate improved back extensor strength and improved sitting tolerance.  Baseline: 8/6: forward head, rounded shoulders   Goal status: INITIAL     PLAN:  PT FREQUENCY: 2x/week  PT DURATION: 8 weeks  PLANNED INTERVENTIONS:  Therapeutic exercises, Therapeutic activity, Neuromuscular re-education, Balance training, Gait training, Patient/Family education, Self Care, Joint mobilization, Vestibular training, Canalith repositioning, Visual/preceptual remediation/compensation, Dry Needling, Electrical stimulation, Spinal manipulation, Cryotherapy, Moist heat, Taping, Traction, Manual therapy, and Re-evaluation  PLAN FOR NEXT SESSION: continue with postural stability exercises and DN as appropriate.   Precious Bard, PT 11/21/2022, 5:14 PM

## 2022-11-21 ENCOUNTER — Ambulatory Visit: Payer: Commercial Managed Care - PPO

## 2022-11-21 DIAGNOSIS — M25512 Pain in left shoulder: Secondary | ICD-10-CM | POA: Diagnosis not present

## 2022-11-21 DIAGNOSIS — R293 Abnormal posture: Secondary | ICD-10-CM | POA: Diagnosis not present

## 2022-11-21 DIAGNOSIS — R4701 Aphasia: Secondary | ICD-10-CM | POA: Diagnosis not present

## 2022-11-22 ENCOUNTER — Ambulatory Visit (INDEPENDENT_AMBULATORY_CARE_PROVIDER_SITE_OTHER): Payer: Commercial Managed Care - PPO

## 2022-11-22 DIAGNOSIS — I6340 Cerebral infarction due to embolism of unspecified cerebral artery: Secondary | ICD-10-CM

## 2022-11-22 NOTE — Therapy (Addendum)
OUTPATIENT PHYSICAL THERAPY SHOULDER TREATMENT   Patient Name: Karl Morgan MRN: [id number] DOB:April 11, 1960, 62 y.o., male Today's Date: 11/26/2022  END OF SESSION:   PT End of Session - 11/26/22 1012     Visit Number 6    Number of Visits 16    Date for PT Re-Evaluation 01/01/23    Authorization Type 6/10    PT Start Time 1015    PT Stop Time 1059    PT Time Calculation (min) 44 min    Activity Tolerance Patient tolerated treatment well    Behavior During Therapy [facility] for tasks assessed/performed                  Past Medical History:  Diagnosis Date   Alopecia 04/04/2011   Diabetes mellitus    Erectile dysfunction 04/04/2011   Heart attack (HCC) 10/07/2014   Overview:  Status post STEMI, anterior myocardial infarction status post DES stent x 2 to the LAD, mid and distal.    Heart murmur    Hyperlipidemia 04/04/2011   Hypogonadism male 04/04/2011   Myocardial infarction [facility])    Stroke (HCC)    x 3 as of 05/2021 last stroke 12/17/20 with aphasia   Substance abuse (HCC)    Type II or unspecified type diabetes mellitus without mention of complication, uncontrolled 04/04/2011   Unspecified essential hypertension 04/04/2011   Past Surgical History:  Procedure Laterality Date   BUBBLE STUDY  08/03/2019   Procedure: BUBBLE STUDY;  Surgeon: Pricilla Riffle, MD;  Location: [facility] ENDOSCOPY;  Service: Cardiovascular;;   CARDIAC CATHETERIZATION     IR ANGIO INTRA EXTRACRAN SEL COM CAROTID INNOMINATE UNI L MOD SED  12/17/2020   IR CT HEAD LTD  12/17/2020   IR PERCUTANEOUS ART THROMBECTOMY/INFUSION INTRACRANIAL INC DIAG ANGIO  12/17/2020   IR US GUIDE VASC ACCESS RIGHT  12/17/2020   LOOP RECORDER INSERTION N/A 08/03/2019   Procedure: LOOP RECORDER INSERTION;  Surgeon: Marinus Maw, MD;  Location: [facility] INVASIVE CV LAB;  Service: Cardiovascular;  Laterality: N/A;   RADIOLOGY WITH ANESTHESIA N/A 12/17/2020   Procedure: IR WITH ANESTHESIA;  Surgeon: Julieanne Cotton, MD;  Location: [facility]  OR;  Service: Radiology;  Laterality: N/A;   TEE WITHOUT CARDIOVERSION N/A 08/03/2019   Procedure: TRANSESOPHAGEAL ECHOCARDIOGRAM (TEE);  Surgeon: Pricilla Riffle, MD;  Location: [facility] ENDOSCOPY;  Service: Cardiovascular;  Laterality: N/A;   Patient Active Problem List   Diagnosis Date Noted   Diastasis of rectus abdominis 12/21/2021   Aphasia 06/21/2021   Need for vaccination 01/18/2021   Elevation of level of transaminase or lactic acid dehydrogenase (LDH) 01/18/2021   Elevated WBC count 01/18/2021   Chest pain 01/18/2021   Acute inferior myocardial infarction (HCC) 01/18/2021   ICH (intracerebral hemorrhage) (HCC) 12/31/2020   Left ventricular apical thrombus    Hypertension associated with diabetes (HCC) 11/08/2020   Gallbladder polyp 11/12/2019   Fatty liver 11/12/2019   Bilateral carotid artery stenosis 08/06/2019   Mitral valve insufficiency 08/06/2019   Cerebrovascular accident (CVA) due to embolism of cerebral artery (HCC) 08/06/2019   Intracranial atherosclerosis 08/06/2019   Arthritis 10/21/2018   Obesity (BMI 30-39.9) 08/22/2017   Herpes simplex type 1 infection 02/21/2015   Recurrent oral herpes simplex infection 02/21/2015   History of stroke 02/16/2015   Arteriosclerosis of coronary artery 10/07/2014   Hypotestosteronism 10/07/2014   B12 deficiency 12/18/2011   Hypothyroidism 05/04/2011   Annual physical exam 04/05/2011   Diabetes mellitus type 2, controlled, with complications (HCC) 04/04/2011  Essential hypertension 04/04/2011   Hyperlipidemia 04/04/2011   Hypogonadism male 04/04/2011   Alopecia 04/04/2011   Erectile dysfunction 04/04/2011    PCP: Dana Allan MD  REFERRING PROVIDER: Dana Allan MD  REFERRING DIAG: strain on L shoulder, spondylosis without myelopathy cervical region, other shoulder lesions L shoulder, lateral epicondylitis L elbow   THERAPY DIAG:  Acute pain of left shoulder  Abnormal posture  Rationale for Evaluation and Treatment:  Rehabilitation  ONSET DATE: 3 weeks ago   SUBJECTIVE:                                                                                                                                                                                      SUBJECTIVE STATEMENT: Patient reports his pain is primarily resolved. Wants to go on a hold due to having no pain.   Hand dominance: Left  PERTINENT HISTORY: Patient presnts for L shoulder pain and spondylosis of cervical region with additional left elbow lateral epicondylitis. PMH includes alopecia, DM, ED, HA, HLD, MI, stroke x 3, substance abuse, Type II DM, and HTN. Patient drives 150 miles a day. Patient had an injection last Friday and has had a cortisone injection. Has been having pain for over three weeks.  Pain radiates to elbow.   PAIN:  Are you having pain? Yes: NPRS scale: 2-3/10 Pain location: L upper trap to Forearm  Pain description: throbby  Aggravating factors: driving Relieving factors: massage  Worst pain: 6/10 Least pain: 2/10   PRECAUTIONS: None  RED FLAGS: None  WEIGHT BEARING RESTRICTIONS: No  FALLS:  Has patient fallen in last 6 months? No  LIVING ENVIRONMENT: Lives with: lives with their spouse Lives in: House/apartment  Has following equipment at home: None  OCCUPATION: Driving long distances, at desk, leaning forward,   PLOF: Independent  PATIENT GOALS:to decrease pain  NEXT MD VISIT:   OBJECTIVE:   DIAGNOSTIC FINDINGS:  Unable to be seen via [facility] system, in [place] system   PATIENT SURVEYS:  FOTO 67  COGNITION: Overall cognitive status: Within functional limits for tasks assessed      POSTURE: Forward head, rounded shoulders  UPPER EXTREMITY ROM:   Active ROM Right eval Left eval L 8/26  Shoulder flexion 140 128 139  Shoulder extension 90 43 74  Shoulder abduction 145 115 140  (Blank rows = not tested)  UPPER EXTREMITY MMT:  MMT Right eval Left eval  Shoulder flexion 5 4   Shoulder extension 5 4  Shoulder abduction 5 4-*  Elbow flexion 5 4  Elbow extension 5 4  (Blank rows = not tested)  SHOULDER SPECIAL TESTS: Impingement tests: Neer impingement test: positive , Hawkins/Kennedy  impingement test: positive , and Painful arc test: positive  SLAP lesions: Biceps load test: negative Instability tests: Apprehension test: negative and Sulcus sign: negative Rotator cuff assessment: Full can test: positive for pain Biceps assessment: Yergason's test: negative  JOINT MOBILITY TESTING:  Hypomobile painful with muscle guarding  PALPATION:  Tension of L upper trap, L subscap, L bicep; L wrist flexors and extensors.    TODAY'S TREATMENT: DATE: 11/26/22   Manual:  STM with implementation of effleurage and ptrissage to L scapular musculature and posterior delt  muscle attachments x 20 minutes   AP mobilization to L shoulder 3x30 seconds Suboccipital release 30 seconds   TherEx:  -scapular pushups 10x  -ball circle 10x clockwise, 10x counterclockwise      PATIENT EDUCATION: Education details: goals, POC, HEP Person educated: Patient Education method: Explanation, Demonstration, Tactile cues, Verbal cues, and Handouts Education comprehension: verbalized understanding, returned demonstration, verbal cues required, tactile cues required, and needs further education  HOME EXERCISE PROGRAM: Access Code: [email or web address] URL: [email or web address] Date: 11/06/2022 Prepared by: Precious Bard  Exercises - Seated Scapular Retraction  - 1 x daily - 7 x weekly - 2 sets - 10 reps - 5 hold - Supine Chin Tuck  - 1 x daily - 7 x weekly - 2 sets - 10 reps - 5 hold - Seated Upper Trapezius Stretch  - 1 x daily - 7 x weekly - 2 sets - 2 reps - 30 hold  Access Code: [email or web address] URL: [email or web address] Date: 11/14/2022 Prepared by: Precious Bard  Exercises - Seated Scapular Retraction  - 1 x daily - 7 x weekly - 2 sets - 10 reps - 5 hold -  Supine Chin Tuck  - 1 x daily - 7 x weekly - 2 sets - 10 reps - 5 hold - Seated Upper Trapezius Stretch  - 1 x daily - 7 x weekly - 2 sets - 2 reps - 30 hold - Corner Pec Major Stretch  - 1 x daily - 7 x weekly - 2 sets - 2 reps - 30 hold - Cervical Retraction at Wall  - 1 x daily - 7 x weekly - 2 sets - 10 reps - 5 hold - Wall Angels  - 1 x daily - 7 x weekly - 2 sets - 10 reps - 5 hold - Shoulder Flexion at Wall  - 1 x daily - 7 x weekly - 2 sets - 10 reps - 5 hold  ASSESSMENT:  CLINICAL IMPRESSION:    Patient will be placed on a brief hold period pending no pain return. Patient is agreeable to plan and will be compliant with HEP. Pt will continue to benefit from skilled therapy to address remaining deficits in order to improve overall QoL and return to PLOF.     OBJECTIVE IMPAIRMENTS: decreased knowledge of condition, decreased mobility, decreased ROM, decreased strength, hypomobility, increased fascial restrictions, impaired perceived functional ability, increased muscle spasms, impaired flexibility, impaired UE functional use, improper body mechanics, postural dysfunction, and pain.   ACTIVITY LIMITATIONS: carrying, lifting, bending, reach over head, and caring for others  PARTICIPATION LIMITATIONS: meal prep, cleaning, laundry, driving, shopping, community activity, occupation, and yard work  PERSONAL FACTORS: Age, Past/current experiences, Profession, Time since onset of injury/illness/exacerbation, and 3+ comorbidities: alopecia, DM, ED, HA, HLD, MI, stroke x 3, substance abuse, Type II DM, and HTN.  are also affecting patient's functional outcome.   REHAB POTENTIAL: Good  CLINICAL DECISION MAKING: Evolving/moderate complexity  EVALUATION COMPLEXITY: Moderate   GOALS:  Goals reviewed with patient? Yes  SHORT TERM GOALS: Target date: 12/04/2022  Patient will be independent in home exercise program to improve strength/mobility for better functional independence with  ADLs. Baseline: Goal status: INITIAL    LONG TERM GOALS: Target date: 01/01/2023  Patient will increase FOTO score to equal to or greater than   72  to demonstrate statistically significant improvement in mobility and quality of life.  Baseline: 8/6 : 67%  8/26: 76%  Goal status: MET  2.  Patient will report a worst pain of 3/10 on VAS in  L shoulder  to improve tolerance with work ,driving, and reduced symptoms with activities.  Baseline: 8/6: 6/10 8/26: 4-5/10  Goal status: Partially Met   3.  Patient will improve shoulder AROM to > 140 degrees of flexion, scaption, and abduction for improved ability to perform overhead activities. Baseline: 8/6: see above 8/26: see above Goal status: Partially Met   4.  Patient will tolerate sitting unsupported demonstrating erect sitting posture with minimal thoracic kyphosis for 15+ minutes to demonstrate improved back extensor strength and improved sitting tolerance.  Baseline: 8/6: forward head, rounded shoulders  8/26: improved ability to remain seated upright.  Goal status: partially met     PLAN:  PT FREQUENCY: 2x/week  PT DURATION: 8 weeks  PLANNED INTERVENTIONS: Therapeutic exercises, Therapeutic activity, Neuromuscular re-education, Balance training, Gait training, Patient/Family education, Self Care, Joint mobilization, Vestibular training, Canalith repositioning, Visual/preceptual remediation/compensation, Dry Needling, Electrical stimulation, Spinal manipulation, Cryotherapy, Moist heat, Taping, Traction, Manual therapy, and Re-evaluation  PLAN FOR NEXT SESSION: continue with postural stability exercises and DN as appropriate.   Precious Bard, PT 11/26/2022, 11:00 AM

## 2022-11-23 LAB — CUP PACEART REMOTE DEVICE CHECK
Date Time Interrogation Session: 20240822230641
Implantable Pulse Generator Implant Date: 20210503

## 2022-11-26 ENCOUNTER — Ambulatory Visit: Payer: Commercial Managed Care - PPO

## 2022-11-26 DIAGNOSIS — R293 Abnormal posture: Secondary | ICD-10-CM | POA: Diagnosis not present

## 2022-11-26 DIAGNOSIS — M25512 Pain in left shoulder: Secondary | ICD-10-CM

## 2022-11-26 DIAGNOSIS — R4701 Aphasia: Secondary | ICD-10-CM | POA: Diagnosis not present

## 2022-11-28 ENCOUNTER — Ambulatory Visit: Payer: Commercial Managed Care - PPO

## 2022-11-28 ENCOUNTER — Other Ambulatory Visit: Payer: Self-pay

## 2022-11-30 NOTE — Progress Notes (Signed)
Carelink Summary Report / Loop Recorder 

## 2022-12-06 ENCOUNTER — Other Ambulatory Visit: Payer: Self-pay

## 2022-12-17 ENCOUNTER — Other Ambulatory Visit: Payer: Self-pay

## 2022-12-19 DIAGNOSIS — E1159 Type 2 diabetes mellitus with other circulatory complications: Secondary | ICD-10-CM | POA: Diagnosis not present

## 2022-12-19 DIAGNOSIS — E785 Hyperlipidemia, unspecified: Secondary | ICD-10-CM | POA: Diagnosis not present

## 2022-12-19 DIAGNOSIS — E039 Hypothyroidism, unspecified: Secondary | ICD-10-CM | POA: Diagnosis not present

## 2022-12-19 DIAGNOSIS — E538 Deficiency of other specified B group vitamins: Secondary | ICD-10-CM | POA: Diagnosis not present

## 2022-12-19 DIAGNOSIS — E1169 Type 2 diabetes mellitus with other specified complication: Secondary | ICD-10-CM | POA: Diagnosis not present

## 2022-12-19 DIAGNOSIS — E119 Type 2 diabetes mellitus without complications: Secondary | ICD-10-CM | POA: Diagnosis not present

## 2022-12-19 DIAGNOSIS — I152 Hypertension secondary to endocrine disorders: Secondary | ICD-10-CM | POA: Diagnosis not present

## 2022-12-19 LAB — BASIC METABOLIC PANEL
Creatinine: 1.3 (ref 0.6–1.3)
Glucose: 198
Sodium: 138 (ref 137–147)

## 2022-12-19 LAB — PROTEIN / CREATININE RATIO, URINE
Albumin, U: <7
Creatinine, Urine: 48.9

## 2022-12-19 LAB — COMPREHENSIVE METABOLIC PANEL: eGFR: 62

## 2022-12-19 LAB — HEMOGLOBIN A1C: Hemoglobin A1C: 8.2

## 2022-12-19 LAB — MICROALBUMIN / CREATININE URINE RATIO: Microalb Creat Ratio: <14.3

## 2022-12-20 ENCOUNTER — Other Ambulatory Visit: Payer: Self-pay

## 2022-12-24 ENCOUNTER — Ambulatory Visit (INDEPENDENT_AMBULATORY_CARE_PROVIDER_SITE_OTHER): Payer: Commercial Managed Care - PPO

## 2022-12-24 ENCOUNTER — Ambulatory Visit (INDEPENDENT_AMBULATORY_CARE_PROVIDER_SITE_OTHER): Payer: Commercial Managed Care - PPO | Admitting: Family Medicine

## 2022-12-24 ENCOUNTER — Encounter: Payer: Self-pay | Admitting: Family Medicine

## 2022-12-24 VITALS — BP 120/70 | HR 67 | Temp 97.7°F | Resp 16 | Ht 72.0 in | Wt 233.1 lb

## 2022-12-24 DIAGNOSIS — I236 Thrombosis of atrium, auricular appendage, and ventricle as current complications following acute myocardial infarction: Secondary | ICD-10-CM | POA: Diagnosis not present

## 2022-12-24 DIAGNOSIS — I152 Hypertension secondary to endocrine disorders: Secondary | ICD-10-CM

## 2022-12-24 DIAGNOSIS — Z0000 Encounter for general adult medical examination without abnormal findings: Secondary | ICD-10-CM | POA: Diagnosis not present

## 2022-12-24 DIAGNOSIS — Z8673 Personal history of transient ischemic attack (TIA), and cerebral infarction without residual deficits: Secondary | ICD-10-CM | POA: Diagnosis not present

## 2022-12-24 DIAGNOSIS — E118 Type 2 diabetes mellitus with unspecified complications: Secondary | ICD-10-CM | POA: Diagnosis not present

## 2022-12-24 DIAGNOSIS — M47812 Spondylosis without myelopathy or radiculopathy, cervical region: Secondary | ICD-10-CM | POA: Diagnosis not present

## 2022-12-24 DIAGNOSIS — I639 Cerebral infarction, unspecified: Secondary | ICD-10-CM | POA: Diagnosis not present

## 2022-12-24 DIAGNOSIS — I10 Essential (primary) hypertension: Secondary | ICD-10-CM | POA: Diagnosis not present

## 2022-12-24 DIAGNOSIS — E538 Deficiency of other specified B group vitamins: Secondary | ICD-10-CM | POA: Diagnosis not present

## 2022-12-24 DIAGNOSIS — I240 Acute coronary thrombosis not resulting in myocardial infarction: Secondary | ICD-10-CM | POA: Diagnosis not present

## 2022-12-24 DIAGNOSIS — E1169 Type 2 diabetes mellitus with other specified complication: Secondary | ICD-10-CM

## 2022-12-24 DIAGNOSIS — E785 Hyperlipidemia, unspecified: Secondary | ICD-10-CM | POA: Diagnosis not present

## 2022-12-24 DIAGNOSIS — E559 Vitamin D deficiency, unspecified: Secondary | ICD-10-CM | POA: Diagnosis not present

## 2022-12-24 DIAGNOSIS — I619 Nontraumatic intracerebral hemorrhage, unspecified: Secondary | ICD-10-CM | POA: Diagnosis not present

## 2022-12-24 DIAGNOSIS — M7582 Other shoulder lesions, left shoulder: Secondary | ICD-10-CM | POA: Diagnosis not present

## 2022-12-24 DIAGNOSIS — Z23 Encounter for immunization: Secondary | ICD-10-CM

## 2022-12-24 DIAGNOSIS — Z7984 Long term (current) use of oral hypoglycemic drugs: Secondary | ICD-10-CM

## 2022-12-24 DIAGNOSIS — E1159 Type 2 diabetes mellitus with other circulatory complications: Secondary | ICD-10-CM | POA: Diagnosis not present

## 2022-12-24 DIAGNOSIS — G459 Transient cerebral ischemic attack, unspecified: Secondary | ICD-10-CM | POA: Diagnosis not present

## 2022-12-24 DIAGNOSIS — E669 Obesity, unspecified: Secondary | ICD-10-CM | POA: Diagnosis not present

## 2022-12-24 DIAGNOSIS — R5383 Other fatigue: Secondary | ICD-10-CM | POA: Diagnosis not present

## 2022-12-24 DIAGNOSIS — R001 Bradycardia, unspecified: Secondary | ICD-10-CM | POA: Diagnosis not present

## 2022-12-24 DIAGNOSIS — I25119 Atherosclerotic heart disease of native coronary artery with unspecified angina pectoris: Secondary | ICD-10-CM | POA: Diagnosis not present

## 2022-12-24 DIAGNOSIS — S46812D Strain of other muscles, fascia and tendons at shoulder and upper arm level, left arm, subsequent encounter: Secondary | ICD-10-CM | POA: Diagnosis not present

## 2022-12-24 DIAGNOSIS — I6340 Cerebral infarction due to embolism of unspecified cerebral artery: Secondary | ICD-10-CM | POA: Diagnosis not present

## 2022-12-24 NOTE — Patient Instructions (Addendum)
It was a pleasure meeting you today. Thank you for allowing me to take part in your health care.  Our goals for today as we discussed include:  Congratulations on your retirement  Recent thyroid normal  Follow up with Endocrinology for A1c management  Check with Cardiology about switching from Metoprolol XL to Carvedilol. Metoprolol can increase fatigue. I have sent a message as well for recommendations  Flu vaccine today  Follow up  as needed  If you have any questions or concerns, please do not hesitate to call the office at [phone].  I look forward to our next visit and until then take care and stay safe.  Regards,   Dana Allan, MD   [facility]

## 2022-12-24 NOTE — Progress Notes (Signed)
SUBJECTIVE:   Chief Complaint  Patient presents with   Annual Exam   HPI Patient presents to clinic for annual physical  No acute concerns today Retired last week  HTN Asymptomatic.  Compliant with lisinopril 40 mg daily and metoprolol XL 50 mg daily.  Has some fatigue.  Follows with cardiology.  Denies any headaches, visual changes, chest pain, shortness of breath or lower extremity edema.  DM Type 2 Asymptomatic.  A1c increased from 7.4 to 8.2.  Currently on glimepiride 2 mg twice daily, metformin 850 mg twice daily and Farxiga 10 mg daily.  Follows with endocrinology at [facility] clinic. Opts for lifestyle changes  History of left ventricular apical thrombus/CAD/history MI Reports thrombus now resolved and currently on apixaban 5 mg twice daily.  Follows with cardiology no yearly follow-up.  History of multiple CVA/history ICH/Intracranial atherosclerosis On Eliquis and statin therapy.  Follows with Dr. Sherryll Burger at [place] [facility], next follow-up due 11/2022.  Hypothyroid Asymptomatic.  Currently on levothyroxine 75 mcg daily.  Follows with endocrinology at [facility] clinic.  Hyperlipidemia On atorvastatin 80 mg daily and Zetia 10 mg daily.  Tolerating medication well.  PERTINENT PMH / PSH: Hypertension Diabetes type 2 Hyperlipidemia Hypothyroidism History of CVA on apixaban History of left ventricular mural thrombus History of MI History of GERD   OBJECTIVE:  BP 120/70   Pulse 67   Temp 97.7 F (36.5 C)   Resp 16   Ht 6' (1.829 m)   Wt 233 lb 2 oz (105.7 kg)   SpO2 99%   BMI 31.62 kg/m    Physical Exam Vitals reviewed.  Constitutional:      General: He is not in acute distress.    Appearance: He is normal weight. He is not ill-appearing.  HENT:     Head: Normocephalic.  Eyes:     Conjunctiva/sclera: Conjunctivae normal.  Neck:     Thyroid: No thyromegaly or thyroid tenderness.  Cardiovascular:     Rate and Rhythm: Normal rate and regular rhythm.      Pulses: Normal pulses.  Pulmonary:     Effort: Pulmonary effort is normal.     Breath sounds: Normal breath sounds.  Abdominal:     General: Bowel sounds are normal.  Neurological:     Mental Status: He is alert. Mental status is at baseline.  Psychiatric:        Mood and Affect: Mood normal.        Behavior: Behavior normal.        Thought Content: Thought content normal.        Judgment: Judgment normal.       12/24/2022    2:54 PM 06/21/2022    1:09 PM 12/21/2021   10:59 AM 11/08/2020    8:51 AM 11/06/2019    8:38 AM  Depression screen PHQ 2/9  Decreased Interest 0 0 0 0 0  Down, Depressed, Hopeless 0 0 0 0 0  PHQ - 2 Score 0 0 0 0 0  Altered sleeping 0      Tired, decreased energy 1      Change in appetite 0      Feeling bad or failure about yourself  0      Trouble concentrating 0      Moving slowly or fidgety/restless 0      Suicidal thoughts 0      PHQ-9 Score 1        ASSESSMENT/PLAN:  Annual physical exam Assessment & Plan: Annual labs reviewed  Colonoscopy up to date PSA with next labs PHQ9/GAD screening complete Shingles up to date Hepatitis C/HIV screening completed Tetanus up to date Recommend PNA 20 Flu Vaccine today   Need for influenza vaccination -     Flu vaccine trivalent PF, 6mos and older(Flulaval,Afluria,Fluarix,Fluzone)  B12 deficiency -     Vitamin B12  Vitamin D deficiency Assessment & Plan: Check Vitamin D level   Orders: -     VITAMIN D 25 Hydroxy (Vit-D Deficiency, Fractures)  Other fatigue Assessment & Plan: Possible side effect of BB Recommend  switch to Carvedilol Patient requesting discussion with cards before switch Check CBC, Vit B 12, Vit D today  Orders: -     IBC + Ferritin -     CBC  Controlled type 2 diabetes mellitus with complication, without long-term current use of insulin (HCC) Assessment & Plan: Chronic.  Stable.  Recent A1c increased 8.2   Continue metformin 850 twice daily Continue glimepiride 2 mg  twice daily Continue Farxiga 10 mg daily Follow-up with endocrinology as scheduled.   Hypertension associated with diabetes [facility]) Assessment & Plan: Chronic.  Stable.  Well-controlled Continue lisinopril 40 mg daily Discontinue metoprolol XL 50 mg daily due to fatigue Start Carvedilol 6.25 mg BID, messaged Dr Mariah Milling and ok with change Follow-up with cardiology as scheduled.   Hyperlipidemia associated with type 2 diabetes mellitus (HCC) Assessment & Plan: Chronic.  Stable. LDL at goal On statin therapy and tolerating well.  No myalgias. Continue atorvastatin 80 mg daily and Zetia 10 mg daily      PDMP reviewed  Return if symptoms worsen or fail to improve, for PCP.  Dana Allan, MD

## 2022-12-25 LAB — CBC
HCT: 43.9 % (ref 39.0–52.0)
Hemoglobin: 14.7 g/dL (ref 13.0–17.0)
MCHC: 33.5 g/dL (ref 30.0–36.0)
MCV: 97.6 fl (ref 78.0–100.0)
Platelets: 166 10*3/uL (ref 150.0–400.0)
RBC: 4.5 Mil/uL (ref 4.22–5.81)
RDW: 14.6 % (ref 11.5–15.5)
WBC: 8.5 10*3/uL (ref 4.0–10.5)

## 2022-12-25 LAB — IBC + FERRITIN
Ferritin: 87.1 ng/mL (ref 22.0–322.0)
Iron: 95 ug/dL (ref 42–165)
Saturation Ratios: 28.3 % (ref 20.0–50.0)
TIBC: 336 ug/dL (ref 250.0–450.0)
Transferrin: 240 mg/dL (ref 212.0–360.0)

## 2022-12-25 LAB — VITAMIN D 25 HYDROXY (VIT D DEFICIENCY, FRACTURES): VITD: 38.87 ng/mL (ref 30.00–100.00)

## 2022-12-25 LAB — VITAMIN B12: Vitamin B-12: 253 pg/mL (ref 211–911)

## 2022-12-26 ENCOUNTER — Other Ambulatory Visit (HOSPITAL_COMMUNITY): Payer: Self-pay

## 2022-12-26 ENCOUNTER — Other Ambulatory Visit: Payer: Self-pay

## 2022-12-26 ENCOUNTER — Encounter: Payer: Self-pay | Admitting: Family Medicine

## 2022-12-26 ENCOUNTER — Other Ambulatory Visit: Payer: Self-pay | Admitting: Family Medicine

## 2022-12-26 ENCOUNTER — Telehealth: Payer: Self-pay | Admitting: Family Medicine

## 2022-12-26 DIAGNOSIS — E1165 Type 2 diabetes mellitus with hyperglycemia: Secondary | ICD-10-CM

## 2022-12-26 DIAGNOSIS — I10 Essential (primary) hypertension: Secondary | ICD-10-CM

## 2022-12-26 LAB — CUP PACEART REMOTE DEVICE CHECK
Date Time Interrogation Session: 20240924230056
Implantable Pulse Generator Implant Date: 20210503

## 2022-12-26 MED ORDER — LISINOPRIL 40 MG PO TABS
40.0000 mg | ORAL_TABLET | Freq: Every day | ORAL | 3 refills | Status: DC
  Filled 2022-12-26: qty 90, fill #0
  Filled 2023-01-14: qty 90, 90d supply, fill #0
  Filled 2023-04-29: qty 90, 90d supply, fill #1
  Filled 2023-07-22: qty 90, 90d supply, fill #2
  Filled 2023-10-21: qty 90, 90d supply, fill #3

## 2022-12-26 MED ORDER — ATORVASTATIN CALCIUM 80 MG PO TABS
80.0000 mg | ORAL_TABLET | Freq: Every day | ORAL | 3 refills | Status: DC
  Filled 2022-12-26 – 2023-01-14 (×2): qty 90, 90d supply, fill #0
  Filled 2023-04-29: qty 90, 90d supply, fill #1
  Filled 2023-07-22: qty 90, 90d supply, fill #2
  Filled 2023-10-21: qty 90, 90d supply, fill #3

## 2022-12-26 MED ORDER — PANTOPRAZOLE SODIUM 40 MG PO TBEC
40.0000 mg | DELAYED_RELEASE_TABLET | Freq: Every day | ORAL | 3 refills | Status: DC
  Filled 2022-12-26 – 2023-03-04 (×2): qty 90, 90d supply, fill #0
  Filled 2023-06-10: qty 90, 90d supply, fill #1
  Filled 2023-08-29: qty 90, 90d supply, fill #2
  Filled 2023-12-03: qty 90, 90d supply, fill #3

## 2022-12-26 MED ORDER — EZETIMIBE 10 MG PO TABS
10.0000 mg | ORAL_TABLET | Freq: Every day | ORAL | 3 refills | Status: DC
  Filled 2022-12-26 – 2023-01-14 (×2): qty 90, 90d supply, fill #0
  Filled 2023-04-29: qty 90, 90d supply, fill #1
  Filled 2023-07-29: qty 90, 90d supply, fill #2
  Filled 2023-10-28: qty 90, 90d supply, fill #3

## 2022-12-26 MED ORDER — GLIMEPIRIDE 2 MG PO TABS
ORAL_TABLET | ORAL | 3 refills | Status: DC
  Filled 2022-12-26 – 2022-12-27 (×2): qty 180, 90d supply, fill #0

## 2022-12-26 MED ORDER — CARVEDILOL 6.25 MG PO TABS
6.2500 mg | ORAL_TABLET | Freq: Two times a day (BID) | ORAL | 3 refills | Status: DC
Start: 2022-12-26 — End: 2023-04-29
  Filled 2022-12-26 – 2022-12-27 (×2): qty 60, 30d supply, fill #0
  Filled 2023-01-25: qty 180, 90d supply, fill #1

## 2022-12-26 NOTE — Telephone Encounter (Signed)
Patient called and would like office to call him back. He states that he tried to get his metoprolol succinate (TOPROL-XL) 50 MG 24 hr tablet refilled and his pharmacy told him this medication was stopped. He would like to know what medication he should be taking now and if any new medications have been called in. He also would like to know his lab results.

## 2022-12-26 NOTE — Telephone Encounter (Signed)
Pt wanted to know about lab results, and needs medication refill. Wanted to know if he need to take B-12 injections. He needs medication sent to [facility].

## 2022-12-27 ENCOUNTER — Other Ambulatory Visit (HOSPITAL_BASED_OUTPATIENT_CLINIC_OR_DEPARTMENT_OTHER): Payer: Self-pay

## 2022-12-27 ENCOUNTER — Other Ambulatory Visit: Payer: Self-pay

## 2022-12-27 ENCOUNTER — Other Ambulatory Visit (HOSPITAL_COMMUNITY): Payer: Self-pay

## 2023-01-01 ENCOUNTER — Other Ambulatory Visit (HOSPITAL_COMMUNITY): Payer: Self-pay

## 2023-01-01 ENCOUNTER — Encounter: Payer: Self-pay | Admitting: Family Medicine

## 2023-01-01 ENCOUNTER — Other Ambulatory Visit: Payer: Self-pay | Admitting: Family Medicine

## 2023-01-01 ENCOUNTER — Other Ambulatory Visit: Payer: Self-pay | Admitting: Internal Medicine

## 2023-01-01 DIAGNOSIS — E559 Vitamin D deficiency, unspecified: Secondary | ICD-10-CM | POA: Insufficient documentation

## 2023-01-01 DIAGNOSIS — Z23 Encounter for immunization: Secondary | ICD-10-CM | POA: Insufficient documentation

## 2023-01-01 DIAGNOSIS — R5383 Other fatigue: Secondary | ICD-10-CM | POA: Insufficient documentation

## 2023-01-01 NOTE — Assessment & Plan Note (Signed)
Annual labs reviewed Colonoscopy up to date PSA with next labs PHQ9/GAD screening complete Shingles up to date Hepatitis C/HIV screening completed Tetanus up to date Recommend PNA 20 Flu Vaccine today

## 2023-01-01 NOTE — Assessment & Plan Note (Signed)
Chronic.  Stable. LDL at goal On statin therapy and tolerating well.  No myalgias. Continue atorvastatin 80 mg daily and Zetia 10 mg daily

## 2023-01-01 NOTE — Assessment & Plan Note (Signed)
Chronic.  Stable.  Recent A1c increased 8.2   Continue metformin 850 twice daily Continue glimepiride 2 mg twice daily Continue Farxiga 10 mg daily Follow-up with endocrinology as scheduled.

## 2023-01-01 NOTE — Assessment & Plan Note (Addendum)
Chronic.  Stable.  Well-controlled Continue lisinopril 40 mg daily Discontinue metoprolol XL 50 mg daily due to fatigue Start Carvedilol 6.25 mg BID, messaged Dr Mariah Milling and ok with change Follow-up with cardiology as scheduled.

## 2023-01-01 NOTE — Assessment & Plan Note (Signed)
Check Vitamin D level 

## 2023-01-01 NOTE — Assessment & Plan Note (Signed)
Possible side effect of BB Recommend  switch to Carvedilol Patient requesting discussion with cards before switch Check CBC, Vit B 12, Vit D today

## 2023-01-04 ENCOUNTER — Other Ambulatory Visit: Payer: Self-pay | Admitting: Family Medicine

## 2023-01-04 ENCOUNTER — Telehealth: Payer: Self-pay | Admitting: Family Medicine

## 2023-01-04 ENCOUNTER — Other Ambulatory Visit (HOSPITAL_COMMUNITY): Payer: Self-pay

## 2023-01-04 MED ORDER — LEVOTHYROXINE SODIUM 75 MCG PO TABS
75.0000 ug | ORAL_TABLET | Freq: Every day | ORAL | 3 refills | Status: DC
  Filled 2023-01-04 – 2023-01-07 (×2): qty 90, 90d supply, fill #0
  Filled 2023-04-01: qty 90, 90d supply, fill #1

## 2023-01-04 NOTE — Telephone Encounter (Signed)
Patient needing prescription sent to [facility] he is out of medication.

## 2023-01-04 NOTE — Progress Notes (Signed)
Carelink Summary Report / Loop Recorder 

## 2023-01-05 ENCOUNTER — Other Ambulatory Visit (HOSPITAL_COMMUNITY): Payer: Self-pay

## 2023-01-07 ENCOUNTER — Telehealth: Payer: Self-pay | Admitting: Family Medicine

## 2023-01-07 ENCOUNTER — Other Ambulatory Visit (HOSPITAL_COMMUNITY): Payer: Self-pay

## 2023-01-07 ENCOUNTER — Other Ambulatory Visit: Payer: Self-pay

## 2023-01-07 ENCOUNTER — Telehealth: Payer: Self-pay

## 2023-01-07 DIAGNOSIS — E118 Type 2 diabetes mellitus with unspecified complications: Secondary | ICD-10-CM

## 2023-01-07 MED ORDER — DAPAGLIFLOZIN PROPANEDIOL 10 MG PO TABS
10.0000 mg | ORAL_TABLET | Freq: Every day | ORAL | 0 refills | Status: DC
Start: 2023-01-07 — End: 2023-01-25
  Filled 2023-01-07 – 2023-01-14 (×2): qty 30, 30d supply, fill #0

## 2023-01-07 NOTE — Telephone Encounter (Signed)
Patient needs a refill on his medication. The name is dapagliflozin propanediol (FARXIGA) 10 MG TABS tablet. The pharmacy he uses is [facility].

## 2023-01-07 NOTE — Telephone Encounter (Signed)
Prescription Request  01/07/2023  LOV: Visit date not found  What is the name of the medication or equipment? dapagliflozin propanediol (FARXIGA) 10 MG TABS tablet and levothyroxine (SYNTHROID) 75 MCG tablet   Have you contacted your pharmacy to request a refill? No   Which pharmacy would you like this sent to?   [place] [facility] [address] [place] [address] Phone: [phone] Fax: [phone]    Patient notified that their request is being sent to the clinical staff for review and that they should receive a response within 2 business days.   Please advise at Mobile [phone] (mobile)  Patient is very concerned and wants to be sure our processes for medication refills are working.  Patient states he retired from [employer] and has moved to [place].  Patient states he would like to keep his doctors here because he likes them, but he does not want to have an issue with getting his medications.  Patient states he has been trying to get this medication refilled for the past week.  Patient states he is using the [facility] because they handle the mail orders.  Patient states he would like to confirm that we are getting the requests from [facility].  Patient states he would like to have someone call him today and confirm that Dr. Dana Allan has sent these prescription refill request to [facility].  Patient states he is out of both of these medications.  Patient states he would like for [place] to switch all of his prescriptions to [facility].  Patient states he would like for Dr. Clent Ridges to write the prescriptions for as many refills as she is comfortable with.  Please call today when prescriptions have been sent.

## 2023-01-07 NOTE — Telephone Encounter (Signed)
Refill sent to [facility].

## 2023-01-07 NOTE — Telephone Encounter (Signed)
Called pt to advised that Dr. Clent Ridges wanted his dm medication refill to go to endo. When I called to advised him of this information the patient got upset and started yelling at me and saying that he does not understand why [place] [facility] filled it and not that Dr. Clent Ridges took her spot she does not want to fill it. I did advise I would check and see if she would send 30 days in for the pt and he stated " good that will give me 30 days to find another provider."

## 2023-01-08 ENCOUNTER — Other Ambulatory Visit: Payer: Self-pay

## 2023-01-08 ENCOUNTER — Encounter: Payer: Self-pay | Admitting: Pharmacist

## 2023-01-11 ENCOUNTER — Other Ambulatory Visit: Payer: Self-pay

## 2023-01-14 ENCOUNTER — Other Ambulatory Visit (HOSPITAL_COMMUNITY): Payer: Self-pay

## 2023-01-14 ENCOUNTER — Other Ambulatory Visit: Payer: Self-pay

## 2023-01-15 ENCOUNTER — Other Ambulatory Visit (HOSPITAL_COMMUNITY): Payer: Self-pay

## 2023-01-16 ENCOUNTER — Other Ambulatory Visit (HOSPITAL_COMMUNITY): Payer: Self-pay

## 2023-01-18 ENCOUNTER — Other Ambulatory Visit: Payer: Self-pay

## 2023-01-24 ENCOUNTER — Ambulatory Visit (INDEPENDENT_AMBULATORY_CARE_PROVIDER_SITE_OTHER): Payer: Commercial Managed Care - PPO

## 2023-01-24 DIAGNOSIS — I6340 Cerebral infarction due to embolism of unspecified cerebral artery: Secondary | ICD-10-CM

## 2023-01-24 DIAGNOSIS — N5201 Erectile dysfunction due to arterial insufficiency: Secondary | ICD-10-CM | POA: Diagnosis not present

## 2023-01-24 LAB — CUP PACEART REMOTE DEVICE CHECK
Date Time Interrogation Session: 20241023230909
Implantable Pulse Generator Implant Date: 20210503

## 2023-01-25 ENCOUNTER — Other Ambulatory Visit: Payer: Self-pay

## 2023-01-25 ENCOUNTER — Other Ambulatory Visit (HOSPITAL_COMMUNITY): Payer: Self-pay

## 2023-01-25 MED ORDER — GLIMEPIRIDE 2 MG PO TABS
2.0000 mg | ORAL_TABLET | Freq: Two times a day (BID) | ORAL | 3 refills | Status: DC
  Filled 2023-04-01: qty 180, 90d supply, fill #0
  Filled 2023-07-01: qty 180, 90d supply, fill #1
  Filled 2023-10-01: qty 180, 90d supply, fill #2
  Filled 2024-01-07: qty 180, 90d supply, fill #3

## 2023-01-25 MED ORDER — METFORMIN HCL 850 MG PO TABS
850.0000 mg | ORAL_TABLET | Freq: Two times a day (BID) | ORAL | 3 refills | Status: DC
  Filled 2023-03-18: qty 180, 90d supply, fill #0
  Filled 2023-06-24: qty 180, 90d supply, fill #1
  Filled 2023-09-16: qty 180, 90d supply, fill #2
  Filled 2023-12-24: qty 180, 90d supply, fill #3

## 2023-01-25 MED ORDER — ELIQUIS 5 MG PO TABS
5.0000 mg | ORAL_TABLET | Freq: Two times a day (BID) | ORAL | 3 refills | Status: DC
  Filled 2023-01-25: qty 180, 90d supply, fill #0
  Filled 2023-02-25 – 2023-04-29 (×2): qty 180, 90d supply, fill #1
  Filled 2023-08-05: qty 180, 90d supply, fill #2
  Filled 2023-11-18: qty 180, 90d supply, fill #3

## 2023-01-25 MED ORDER — LEVOTHYROXINE SODIUM 75 MCG PO TABS
75.0000 ug | ORAL_TABLET | Freq: Every day | ORAL | 4 refills | Status: DC
  Filled 2023-07-01: qty 90, 90d supply, fill #0
  Filled 2023-10-01: qty 90, 90d supply, fill #1
  Filled 2023-12-24: qty 90, 90d supply, fill #2

## 2023-01-25 MED ORDER — FARXIGA 10 MG PO TABS
10.0000 mg | ORAL_TABLET | Freq: Every day | ORAL | 3 refills | Status: DC
  Filled 2023-02-04: qty 90, 90d supply, fill #0
  Filled 2023-04-29: qty 90, 90d supply, fill #1
  Filled 2023-08-05: qty 90, 90d supply, fill #2
  Filled 2023-11-06: qty 90, 90d supply, fill #3

## 2023-02-04 ENCOUNTER — Other Ambulatory Visit (HOSPITAL_COMMUNITY): Payer: Self-pay

## 2023-02-04 ENCOUNTER — Other Ambulatory Visit: Payer: Self-pay

## 2023-02-08 ENCOUNTER — Other Ambulatory Visit: Payer: Self-pay | Admitting: Urology

## 2023-02-11 ENCOUNTER — Other Ambulatory Visit: Payer: Self-pay

## 2023-02-11 ENCOUNTER — Other Ambulatory Visit (HOSPITAL_COMMUNITY): Payer: Self-pay

## 2023-02-12 ENCOUNTER — Other Ambulatory Visit (HOSPITAL_COMMUNITY): Payer: Self-pay

## 2023-02-12 NOTE — Progress Notes (Signed)
Carelink Summary Report / Loop Recorder 

## 2023-02-13 DIAGNOSIS — I10 Essential (primary) hypertension: Secondary | ICD-10-CM | POA: Diagnosis not present

## 2023-02-13 DIAGNOSIS — E1149 Type 2 diabetes mellitus with other diabetic neurological complication: Secondary | ICD-10-CM | POA: Diagnosis not present

## 2023-02-13 DIAGNOSIS — I2510 Atherosclerotic heart disease of native coronary artery without angina pectoris: Secondary | ICD-10-CM | POA: Diagnosis not present

## 2023-02-13 DIAGNOSIS — Z1331 Encounter for screening for depression: Secondary | ICD-10-CM | POA: Diagnosis not present

## 2023-02-13 DIAGNOSIS — E7800 Pure hypercholesterolemia, unspecified: Secondary | ICD-10-CM | POA: Diagnosis not present

## 2023-02-14 ENCOUNTER — Other Ambulatory Visit (HOSPITAL_COMMUNITY): Payer: Self-pay

## 2023-02-15 ENCOUNTER — Other Ambulatory Visit (HOSPITAL_COMMUNITY): Payer: Self-pay

## 2023-02-18 DIAGNOSIS — S46812D Strain of other muscles, fascia and tendons at shoulder and upper arm level, left arm, subsequent encounter: Secondary | ICD-10-CM | POA: Diagnosis not present

## 2023-02-18 DIAGNOSIS — M7582 Other shoulder lesions, left shoulder: Secondary | ICD-10-CM | POA: Diagnosis not present

## 2023-02-18 DIAGNOSIS — M25812 Other specified joint disorders, left shoulder: Secondary | ICD-10-CM | POA: Diagnosis not present

## 2023-02-19 ENCOUNTER — Other Ambulatory Visit (HOSPITAL_COMMUNITY): Payer: Self-pay

## 2023-02-19 ENCOUNTER — Other Ambulatory Visit: Payer: Self-pay

## 2023-02-25 ENCOUNTER — Other Ambulatory Visit (HOSPITAL_COMMUNITY): Payer: Self-pay

## 2023-02-25 ENCOUNTER — Ambulatory Visit: Payer: Commercial Managed Care - PPO

## 2023-02-25 DIAGNOSIS — I6340 Cerebral infarction due to embolism of unspecified cerebral artery: Secondary | ICD-10-CM | POA: Diagnosis not present

## 2023-02-26 LAB — CUP PACEART REMOTE DEVICE CHECK
Date Time Interrogation Session: 20241125230116
Implantable Pulse Generator Implant Date: 20210503

## 2023-03-04 ENCOUNTER — Other Ambulatory Visit: Payer: Self-pay

## 2023-03-04 ENCOUNTER — Other Ambulatory Visit (HOSPITAL_COMMUNITY): Payer: Self-pay

## 2023-03-15 ENCOUNTER — Other Ambulatory Visit (HOSPITAL_BASED_OUTPATIENT_CLINIC_OR_DEPARTMENT_OTHER): Payer: Self-pay

## 2023-03-18 ENCOUNTER — Other Ambulatory Visit (HOSPITAL_COMMUNITY): Payer: Self-pay

## 2023-03-18 ENCOUNTER — Other Ambulatory Visit: Payer: Self-pay

## 2023-03-21 NOTE — Progress Notes (Signed)
Carelink Summary Report / Loop Recorder 

## 2023-03-21 NOTE — Addendum Note (Signed)
Addended by: Geralyn Flash D on: 03/21/2023 01:29 PM   Modules accepted: Orders

## 2023-03-23 DIAGNOSIS — J019 Acute sinusitis, unspecified: Secondary | ICD-10-CM | POA: Diagnosis not present

## 2023-03-23 DIAGNOSIS — Z20822 Contact with and (suspected) exposure to covid-19: Secondary | ICD-10-CM | POA: Diagnosis not present

## 2023-03-28 ENCOUNTER — Ambulatory Visit (INDEPENDENT_AMBULATORY_CARE_PROVIDER_SITE_OTHER): Payer: Commercial Managed Care - PPO

## 2023-03-28 DIAGNOSIS — I6340 Cerebral infarction due to embolism of unspecified cerebral artery: Secondary | ICD-10-CM | POA: Diagnosis not present

## 2023-03-28 LAB — CUP PACEART REMOTE DEVICE CHECK
Date Time Interrogation Session: 20241225230141
Implantable Pulse Generator Implant Date: 20210503

## 2023-04-01 ENCOUNTER — Other Ambulatory Visit: Payer: Self-pay

## 2023-04-01 ENCOUNTER — Other Ambulatory Visit (HOSPITAL_COMMUNITY): Payer: Self-pay

## 2023-04-16 NOTE — Progress Notes (Addendum)
Anesthesia Review:  PCP: Rosezena Sensor- LOV  02/13/23  Endocrinologist- O' Connell LOV 04/18/23.  Cardiologist : DR Juliann Pares LOV 12/24/22  Endocrinology- DR Gershon Crane  Neurology- DR Sherryll Burger- LOV 11/13/21  11/13/24Lanora Manis Sever ?   Device check- 03/28/23  Chest x-ray : EKG : 12/24/22- [facility] - have requested  tracing  fax from Dr Juliann Pares office on 04/22/23. Rereq on 04/25/23.  REceived 12 lead EKG tracing on 04/25/23 in Media tab dated 04/25/23.   MR Card- 12/19/20  Echo : 12/17/2020 MR Card- 12/19/20  Stress test: Cardiac Cath :  Activity level: can do a flight of stairs without difficudtly  Sleep Study/ CPAP : has sleep apnea no cpap  Fasting Blood Sugar :      / Checks Blood Sugar -- times a day:   Blood Thinner/ Instructions /Last Dose: ASA / Instructions/ Last Dose :    Eliquis- pt states he was given verbal instructons per DR [facility] and DR Candace Cruise unsure of how many days at preop.  PT instructed to call Dr Lafonda Mosses and/or DR Juliann Pares to clarify.  PT voiced understanding.,    In Media dated 04/23/23- from DR [employer] hold Eliquis instructons for 5 days.      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  Hx of stroke - hx of aphasia at times- per pt sometimes he cannot get a word out.    AT time of preop appt pt wanting to know how much procedure was going to cost him.  Gave tp phone number for anesthesia and billing.     PT has loop recorder.  Last device check 03/28/23 in Media Tab

## 2023-04-18 DIAGNOSIS — E785 Hyperlipidemia, unspecified: Secondary | ICD-10-CM | POA: Diagnosis not present

## 2023-04-18 DIAGNOSIS — E039 Hypothyroidism, unspecified: Secondary | ICD-10-CM | POA: Diagnosis not present

## 2023-04-18 DIAGNOSIS — I152 Hypertension secondary to endocrine disorders: Secondary | ICD-10-CM | POA: Diagnosis not present

## 2023-04-18 DIAGNOSIS — E1159 Type 2 diabetes mellitus with other circulatory complications: Secondary | ICD-10-CM | POA: Diagnosis not present

## 2023-04-18 DIAGNOSIS — E1165 Type 2 diabetes mellitus with hyperglycemia: Secondary | ICD-10-CM | POA: Diagnosis not present

## 2023-04-18 DIAGNOSIS — Z794 Long term (current) use of insulin: Secondary | ICD-10-CM | POA: Diagnosis not present

## 2023-04-18 DIAGNOSIS — E1169 Type 2 diabetes mellitus with other specified complication: Secondary | ICD-10-CM | POA: Diagnosis not present

## 2023-04-22 ENCOUNTER — Encounter (HOSPITAL_COMMUNITY)
Admission: RE | Admit: 2023-04-22 | Discharge: 2023-04-22 | Disposition: A | Discharge status: Home / Self Care | Payer: Commercial Managed Care - PPO | Source: Ambulatory Visit | Attending: Urology | Admitting: Urology

## 2023-04-22 ENCOUNTER — Other Ambulatory Visit: Payer: Self-pay

## 2023-04-22 ENCOUNTER — Encounter (HOSPITAL_COMMUNITY): Payer: Self-pay

## 2023-04-22 VITALS — BP 138/82 | HR 73 | Temp 98.0°F | Resp 16 | Ht 72.0 in | Wt 230.0 lb

## 2023-04-22 DIAGNOSIS — Z01810 Encounter for preprocedural cardiovascular examination: Secondary | ICD-10-CM | POA: Diagnosis present

## 2023-04-22 DIAGNOSIS — H5213 Myopia, bilateral: Secondary | ICD-10-CM | POA: Diagnosis not present

## 2023-04-22 DIAGNOSIS — H43813 Vitreous degeneration, bilateral: Secondary | ICD-10-CM | POA: Diagnosis not present

## 2023-04-22 DIAGNOSIS — Z01812 Encounter for preprocedural laboratory examination: Secondary | ICD-10-CM | POA: Diagnosis present

## 2023-04-22 DIAGNOSIS — Z01818 Encounter for other preprocedural examination: Secondary | ICD-10-CM | POA: Diagnosis not present

## 2023-04-22 DIAGNOSIS — E119 Type 2 diabetes mellitus without complications: Secondary | ICD-10-CM | POA: Diagnosis not present

## 2023-04-22 DIAGNOSIS — H2513 Age-related nuclear cataract, bilateral: Secondary | ICD-10-CM | POA: Diagnosis not present

## 2023-04-22 DIAGNOSIS — H02831 Dermatochalasis of right upper eyelid: Secondary | ICD-10-CM | POA: Diagnosis not present

## 2023-04-22 HISTORY — DX: Sleep apnea, unspecified: G47.30

## 2023-04-22 HISTORY — DX: Gastro-esophageal reflux disease without esophagitis: K21.9

## 2023-04-22 HISTORY — DX: Unspecified osteoarthritis, unspecified site: M19.90

## 2023-04-22 LAB — CBC
HCT: 43.8 % (ref 39.0–52.0)
Hemoglobin: 14.3 g/dL (ref 13.0–17.0)
MCH: 31.6 pg (ref 26.0–34.0)
MCHC: 32.6 g/dL (ref 30.0–36.0)
MCV: 96.7 fL (ref 80.0–100.0)
Platelets: 171 10*3/uL (ref 150–400)
RBC: 4.53 MIL/uL (ref 4.22–5.81)
RDW: 12.8 % (ref 11.5–15.5)
WBC: 7.3 10*3/uL (ref 4.0–10.5)
nRBC: 0 % (ref 0.0–0.2)

## 2023-04-22 LAB — BASIC METABOLIC PANEL
Anion gap: 9 (ref 5–15)
BUN: 15 mg/dL (ref 8–23)
CO2: 19 mmol/L — ABNORMAL LOW (ref 22–32)
Calcium: 8.8 mg/dL — ABNORMAL LOW (ref 8.9–10.3)
Chloride: 112 mmol/L — ABNORMAL HIGH (ref 98–111)
Creatinine, Ser: 1.22 mg/dL (ref 0.61–1.24)
GFR, Estimated: >60 mL/min (ref >60)
Glucose, Bld: 184 mg/dL — ABNORMAL HIGH (ref 70–99)
Potassium: 4.3 mmol/L (ref 3.5–5.1)
Sodium: 140 mmol/L (ref 135–145)

## 2023-04-22 LAB — GLUCOSE, CAPILLARY: Glucose-Capillary: 165 mg/dL — ABNORMAL HIGH (ref 70–99)

## 2023-04-22 LAB — HEMOGLOBIN A1C
Hgb A1c MFr Bld: 6.8 % — ABNORMAL HIGH (ref 4.8–5.6)
Mean Plasma Glucose: 148.46 mg/dL

## 2023-04-23 LAB — URINE CULTURE: Culture: NO GROWTH

## 2023-04-23 NOTE — Progress Notes (Addendum)
Anesthesia Chart Review   Case: [id number] Date/Time: 05/07/23 0715   Procedure: INSERTION OF INFLATABLE PENILE PROSTHESIS - 105 MINUTES NEEDED FOR CASE   Anesthesia type: General   Pre-op diagnosis: ERECTILE DYSFUNCTION   Location: [facility] ROOM 03 / [facility]   Surgeons: Despina Arias, MD       DISCUSSION:63 y.o. never smoker with h/o HTN, sleep apnea, Stroke, atrial fibrillation, CAD s/p 2 stents 2014, DM II, erectile dysfunction scheduled for above procedure 05/07/2023 with Dr. Traci Sermon.   Pt last seen by cardiology 12/24/2022.  Stable at this visit with 1 year follow up.   Clearance from cardiologist Dr. Juliann Pares received.  Pt can hold Eliquis 5 days prior to procedure.  VS: There were no vitals taken for this visit.  PROVIDERS: Sever, Olene Craven, DO is PCP   Marciano Sequin, MD is Cardiologist  LABS: Labs reviewed: Acceptable for surgery. (all labs ordered are listed, but only abnormal results are displayed)  Labs Reviewed - No data to display   IMAGES:   EKG:   CV: Echo 12/17/2020 1. Definity contrast utilized. There is a nonmobile LV mural thrombus  (approximately 1.2 x 0.5 cm) in region of akinesis involving the apical  septal wall. Reported to covering team at 5:10 PM.   2. Left ventricular ejection fraction, by estimation, is 60 to 65%. The  left ventricle has normal function. The left ventricle demonstrates  regional wall motion abnormalities (see scoring diagram/findings for  description). There is mild left ventricular   hypertrophy. Left ventricular diastolic parameters are indeterminate.   3. Right ventricular systolic function is normal. The right ventricular  size is normal. Tricuspid regurgitation signal is inadequate for assessing  PA pressure.   4. The mitral valve is grossly normal. Trivial mitral valve  regurgitation.   5. The aortic valve is tricuspid. Aortic valve regurgitation is not  visualized.   6. The inferior vena cava is normal in  size with greater than 50%  respiratory variability, suggesting right atrial pressure of 3 mmHg.  Past Medical History:  Diagnosis Date   Alopecia 04/04/2011   Arthritis    Diabetes mellitus    Erectile dysfunction 04/04/2011   GERD (gastroesophageal reflux disease)    Heart attack (HCC) 10/07/2014   Overview:  Status post STEMI, anterior myocardial infarction status post DES stent x 2 to the LAD, mid and distal.    Heart murmur    as a chld   Hyperlipidemia 04/04/2011   Hypogonadism male 04/04/2011   Myocardial infarction [facility])    Sleep apnea    Stroke (HCC)    x 3 as of 05/2021 last stroke 12/17/20 with aphasia   Type II or unspecified type diabetes mellitus without mention of complication, uncontrolled 04/04/2011   Unspecified essential hypertension 04/04/2011    Past Surgical History:  Procedure Laterality Date   BUBBLE STUDY  08/03/2019   Procedure: BUBBLE STUDY;  Surgeon: Pricilla Riffle, MD;  Location: [facility] ENDOSCOPY;  Service: Cardiovascular;;   CARDIAC CATHETERIZATION     cardiac stents      COLONOSCOPY     IR ANGIO INTRA EXTRACRAN SEL COM CAROTID INNOMINATE UNI L MOD SED  12/17/2020   IR CT HEAD LTD  12/17/2020   IR PERCUTANEOUS ART THROMBECTOMY/INFUSION INTRACRANIAL INC DIAG ANGIO  12/17/2020   IR US GUIDE VASC ACCESS RIGHT  12/17/2020   LOOP RECORDER INSERTION N/A 08/03/2019   Procedure: LOOP RECORDER INSERTION;  Surgeon: Marinus Maw, MD;  Location: [facility] INVASIVE CV  LAB;  Service: Cardiovascular;  Laterality: N/A;   RADIOLOGY WITH ANESTHESIA N/A 12/17/2020   Procedure: IR WITH ANESTHESIA;  Surgeon: Julieanne Cotton, MD;  Location: [facility] OR;  Service: Radiology;  Laterality: N/A;   TEE WITHOUT CARDIOVERSION N/A 08/03/2019   Procedure: TRANSESOPHAGEAL ECHOCARDIOGRAM (TEE);  Surgeon: Pricilla Riffle, MD;  Location: [facility] ENDOSCOPY;  Service: Cardiovascular;  Laterality: N/A;    MEDICATIONS: No current facility-administered medications for this encounter.    alprostadil  (EDEX) 40 MCG injection   apixaban (ELIQUIS) 5 MG TABS tablet   atorvastatin (LIPITOR) 80 MG tablet   carvedilol (COREG) 6.25 MG tablet   ezetimibe (ZETIA) 10 MG tablet   FARXIGA 10 MG TABS tablet   glimepiride (AMARYL) 2 MG tablet   levothyroxine (SYNTHROID) 75 MCG tablet   lisinopril (ZESTRIL) 40 MG tablet   metFORMIN (GLUCOPHAGE) 850 MG tablet   Multiple Vitamins-Minerals (MULTIVITAMIN WITH MINERALS) tablet   pantoprazole (PROTONIX) 40 MG tablet   sildenafil (VIAGRA) 100 MG tablet   valACYclovir (VALTREX) 1000 MG tablet    [facility], PA-C [facility] Pre-Surgical Testing [phone]

## 2023-04-29 ENCOUNTER — Other Ambulatory Visit (HOSPITAL_COMMUNITY): Payer: Self-pay

## 2023-04-29 ENCOUNTER — Other Ambulatory Visit: Payer: Self-pay | Admitting: Family Medicine

## 2023-04-29 ENCOUNTER — Encounter: Payer: Self-pay | Admitting: Internal Medicine

## 2023-04-29 ENCOUNTER — Other Ambulatory Visit: Payer: Self-pay

## 2023-04-29 ENCOUNTER — Ambulatory Visit (INDEPENDENT_AMBULATORY_CARE_PROVIDER_SITE_OTHER): Payer: Commercial Managed Care - PPO

## 2023-04-29 DIAGNOSIS — I6340 Cerebral infarction due to embolism of unspecified cerebral artery: Secondary | ICD-10-CM

## 2023-04-29 DIAGNOSIS — I10 Essential (primary) hypertension: Secondary | ICD-10-CM

## 2023-04-29 LAB — CUP PACEART REMOTE DEVICE CHECK
Date Time Interrogation Session: 20250126230110
Implantable Pulse Generator Implant Date: 20210503

## 2023-04-29 MED ORDER — CARVEDILOL 6.25 MG PO TABS
6.2500 mg | ORAL_TABLET | Freq: Two times a day (BID) | ORAL | 3 refills | Status: DC
  Filled 2023-04-29: qty 60, 30d supply, fill #0
  Filled 2023-05-29: qty 60, 30d supply, fill #1
  Filled 2023-06-24: qty 60, 30d supply, fill #2
  Filled 2023-07-29: qty 60, 30d supply, fill #3

## 2023-04-30 ENCOUNTER — Other Ambulatory Visit: Payer: Self-pay

## 2023-04-30 ENCOUNTER — Other Ambulatory Visit (HOSPITAL_COMMUNITY): Payer: Self-pay

## 2023-05-01 ENCOUNTER — Other Ambulatory Visit: Payer: Self-pay

## 2023-05-02 DIAGNOSIS — Z8673 Personal history of transient ischemic attack (TIA), and cerebral infarction without residual deficits: Secondary | ICD-10-CM | POA: Diagnosis not present

## 2023-05-02 DIAGNOSIS — Z7901 Long term (current) use of anticoagulants: Secondary | ICD-10-CM | POA: Diagnosis not present

## 2023-05-02 DIAGNOSIS — N5201 Erectile dysfunction due to arterial insufficiency: Secondary | ICD-10-CM | POA: Diagnosis not present

## 2023-05-02 DIAGNOSIS — I10 Essential (primary) hypertension: Secondary | ICD-10-CM | POA: Diagnosis not present

## 2023-05-06 MED ORDER — GENTAMICIN SULFATE 40 MG/ML IJ SOLN
5.0000 mg/kg | INTRAVENOUS | Status: AC
  Administered 2023-05-07: 440 mg via INTRAVENOUS
  Filled 2023-05-06: qty 11

## 2023-05-06 NOTE — Anesthesia Preprocedure Evaluation (Signed)
Anesthesia Evaluation  Patient identified by MRN, date of birth, ID band Patient awake    Reviewed: Allergy & Precautions, NPO status , Patient's Chart, lab work & pertinent test results, reviewed documented beta blocker date and time   Airway Mallampati: II  TM Distance: >3 FB Neck ROM: Full    Dental no notable dental hx.    Pulmonary sleep apnea    Pulmonary exam normal breath sounds clear to auscultation       Cardiovascular hypertension, Pt. on medications and Pt. on home beta blockers + CAD, + Past MI and + Cardiac Stents  Normal cardiovascular exam+ Valvular Problems/Murmurs  Rhythm:Regular Rate:Normal  Echocardiogram 2D complete: (10/26/2021) INTERPRETATION NORMAL LEFT VENTRICULAR SYSTOLIC FUNCTION WITH AN ESTIMATED EF = >55 % NORMAL RIGHT VENTRICULAR SYSTOLIC FUNCTION MILD VALVULAR REGURGITATION (See above) NO VALVULAR STENOSIS No evidence of LV thrombus     Cardiac MRI 12/2020 MPRESSION: 1. LV apical thrombus measuring 11mm x 6mm 2. Subendocardial late gadolinium enhancement consistent with prior infarct in part of the mid to apical anterior wall, mid anteroseptal wall, apical septal/inferior walls, and apex. LGE is less than 50% transmural in the mid anteroseptal/anterior walls and apical septal/anterior/inferior walls, suggesting viability. LGE is greater than 50% transmural at the apex, suggesting apex is not viable. 3. Normal LV size with low normal systolic function (EF 51%). Akinesis of apex. 4.  Normal RV size and systolic function (EF 59%)   Echo 2022  1. Definity contrast utilized. There is a nonmobile LV mural thrombus (approximately 1.2 x 0.5 cm) in region of akinesis involving the apical septal wall. Reported to covering team at 5:10 PM.   2. Left ventricular ejection fraction, by estimation, is 60 to 65%. The left ventricle has normal function. The left ventricle demonstrates regional wall motion  abnormalities (see scoring diagram/findings for description). There is mild left ventricular hypertrophy. Left ventricular diastolic parameters are indeterminate.   3. Right ventricular systolic function is normal. The right ventricular size is normal. Tricuspid regurgitation signal is inadequate for assessing PA pressure.   4. The mitral valve is grossly normal. Trivial mitral valve regurgitation.   5. The aortic valve is tricuspid. Aortic valve regurgitation is not visualized.   6. The inferior vena cava is normal in size with greater than 50% respiratory variability, suggesting right atrial pressure of 3 mmHg.   Comparison(s): Prior images reviewed side by side. LV mural thrombus newly  identified. Prior study from May 2021 did have similar apical septal wall motion abnormality.     Neuro/Psych CVA    GI/Hepatic Neg liver ROS,GERD  Medicated and Controlled,,  Endo/Other  diabetes, Type 2, Oral Hypoglycemic AgentsHypothyroidism  Obesity   Renal/GU Renal InsufficiencyRenal disease     Musculoskeletal  (+) Arthritis ,    Abdominal  (+) + obese  Peds  Hematology  (+) Blood dyscrasia (Plavix)   Anesthesia Other Findings Day of surgery medications reviewed with the patient.  Reproductive/Obstetrics                             Anesthesia Physical Anesthesia Plan  ASA: 3  Anesthesia Plan: General   Post-op Pain Management: Tylenol PO (pre-op)*   Induction: Intravenous  PONV Risk Score and Plan: 2 and Dexamethasone and Ondansetron  Airway Management Planned: LMA  Additional Equipment: None  Intra-op Plan:   Post-operative Plan: Extubation in OR  Informed Consent: I have reviewed the patients History and Physical, chart, labs and  discussed the procedure including the risks, benefits and alternatives for the proposed anesthesia with the patient or authorized representative who has indicated his/her understanding and acceptance.     Dental  advisory given  Plan Discussed with: CRNA  Anesthesia Plan Comments:         Anesthesia Quick Evaluation

## 2023-05-07 ENCOUNTER — Ambulatory Visit (HOSPITAL_BASED_OUTPATIENT_CLINIC_OR_DEPARTMENT_OTHER): Payer: Commercial Managed Care - PPO | Admitting: Physician Assistant

## 2023-05-07 ENCOUNTER — Other Ambulatory Visit: Payer: Self-pay

## 2023-05-07 ENCOUNTER — Encounter (HOSPITAL_COMMUNITY): Payer: Self-pay | Admitting: Urology

## 2023-05-07 ENCOUNTER — Encounter (HOSPITAL_COMMUNITY)
Admission: RE | Disposition: A | Discharge status: Home / Self Care | Payer: Self-pay | Source: Home / Self Care | Attending: Urology

## 2023-05-07 ENCOUNTER — Ambulatory Visit (HOSPITAL_COMMUNITY): Payer: Commercial Managed Care - PPO | Admitting: Physician Assistant

## 2023-05-07 ENCOUNTER — Other Ambulatory Visit (HOSPITAL_COMMUNITY): Payer: Self-pay

## 2023-05-07 ENCOUNTER — Ambulatory Visit (HOSPITAL_COMMUNITY)
Admission: RE | Admit: 2023-05-07 | Discharge: 2023-05-07 | Disposition: A | Discharge status: Home / Self Care | Payer: Commercial Managed Care - PPO | Attending: Urology | Admitting: Urology

## 2023-05-07 DIAGNOSIS — G4730 Sleep apnea, unspecified: Secondary | ICD-10-CM | POA: Diagnosis not present

## 2023-05-07 DIAGNOSIS — N528 Other male erectile dysfunction: Secondary | ICD-10-CM | POA: Diagnosis not present

## 2023-05-07 DIAGNOSIS — I10 Essential (primary) hypertension: Secondary | ICD-10-CM | POA: Diagnosis not present

## 2023-05-07 DIAGNOSIS — Z7902 Long term (current) use of antithrombotics/antiplatelets: Secondary | ICD-10-CM | POA: Insufficient documentation

## 2023-05-07 DIAGNOSIS — Z8673 Personal history of transient ischemic attack (TIA), and cerebral infarction without residual deficits: Secondary | ICD-10-CM | POA: Insufficient documentation

## 2023-05-07 DIAGNOSIS — I2510 Atherosclerotic heart disease of native coronary artery without angina pectoris: Secondary | ICD-10-CM | POA: Insufficient documentation

## 2023-05-07 DIAGNOSIS — E669 Obesity, unspecified: Secondary | ICD-10-CM | POA: Insufficient documentation

## 2023-05-07 DIAGNOSIS — E039 Hypothyroidism, unspecified: Secondary | ICD-10-CM | POA: Insufficient documentation

## 2023-05-07 DIAGNOSIS — K219 Gastro-esophageal reflux disease without esophagitis: Secondary | ICD-10-CM | POA: Insufficient documentation

## 2023-05-07 DIAGNOSIS — Z79899 Other long term (current) drug therapy: Secondary | ICD-10-CM | POA: Insufficient documentation

## 2023-05-07 DIAGNOSIS — E119 Type 2 diabetes mellitus without complications: Secondary | ICD-10-CM | POA: Insufficient documentation

## 2023-05-07 DIAGNOSIS — I252 Old myocardial infarction: Secondary | ICD-10-CM | POA: Diagnosis not present

## 2023-05-07 DIAGNOSIS — Z01818 Encounter for other preprocedural examination: Secondary | ICD-10-CM

## 2023-05-07 DIAGNOSIS — Z6831 Body mass index (BMI) 31.0-31.9, adult: Secondary | ICD-10-CM | POA: Diagnosis not present

## 2023-05-07 DIAGNOSIS — Z7984 Long term (current) use of oral hypoglycemic drugs: Secondary | ICD-10-CM | POA: Diagnosis not present

## 2023-05-07 DIAGNOSIS — Z955 Presence of coronary angioplasty implant and graft: Secondary | ICD-10-CM | POA: Diagnosis not present

## 2023-05-07 DIAGNOSIS — N529 Male erectile dysfunction, unspecified: Secondary | ICD-10-CM

## 2023-05-07 HISTORY — PX: PENILE PROSTHESIS IMPLANT: SHX240

## 2023-05-07 LAB — GLUCOSE, CAPILLARY
Glucose-Capillary: 199 mg/dL — ABNORMAL HIGH (ref 70–99)
Glucose-Capillary: 145 mg/dL — ABNORMAL HIGH (ref 70–99)
Glucose-Capillary: 158 mg/dL — ABNORMAL HIGH (ref 70–99)

## 2023-05-07 SURGERY — INSERTION, PENILE PROSTHESIS
Anesthesia: General | Site: Scrotum

## 2023-05-07 MED ORDER — INSULIN ASPART 100 UNIT/ML IJ SOLN
0.0000 [IU] | INTRAMUSCULAR | Status: AC | PRN
  Administered 2023-05-07: 4 [IU] via SUBCUTANEOUS
  Administered 2023-05-07: 2 [IU] via SUBCUTANEOUS
  Filled 2023-05-07: qty 1

## 2023-05-07 MED ORDER — MUPIROCIN 2 % EX OINT: 1.0000 | TOPICAL_OINTMENT | Freq: Once | CUTANEOUS | Status: DC

## 2023-05-07 MED ORDER — CHLORHEXIDINE GLUCONATE 0.12 % MT SOLN
15.0000 mL | Freq: Once | OROMUCOSAL | Status: AC
  Administered 2023-05-07: 15 mL via OROMUCOSAL

## 2023-05-07 MED ORDER — OXYCODONE HCL 5 MG PO TABS
5.0000 mg | ORAL_TABLET | Freq: Once | ORAL | Status: AC
  Administered 2023-05-07: 5 mg via ORAL

## 2023-05-07 MED ORDER — PHENYLEPHRINE HCL-NACL 20-0.9 MG/250ML-% IV SOLN
INTRAVENOUS | Status: DC | PRN
  Administered 2023-05-07: 25 ug/min via INTRAVENOUS

## 2023-05-07 MED ORDER — ACETAMINOPHEN 500 MG PO TABS
1000.0000 mg | ORAL_TABLET | Freq: Four times a day (QID) | ORAL | 0 refills | Status: AC
  Filled 2023-05-07: qty 50, 7d supply, fill #0

## 2023-05-07 MED ORDER — ONDANSETRON HCL 4 MG/2ML IJ SOLN
INTRAMUSCULAR | Status: AC
  Filled 2023-05-07: qty 2

## 2023-05-07 MED ORDER — IRRISEPT - 450ML BOTTLE WITH 0.05% CHG IN STERILE WATER, USP 99.95% OPTIME
TOPICAL | Status: DC | PRN
  Administered 2023-05-07 (×2): 450 mL via TOPICAL

## 2023-05-07 MED ORDER — OXYCODONE HCL 5 MG PO TABS
5.0000 mg | ORAL_TABLET | Freq: Four times a day (QID) | ORAL | 0 refills | Status: AC | PRN
  Filled 2023-05-07: qty 16, 4d supply, fill #0

## 2023-05-07 MED ORDER — DROPERIDOL 2.5 MG/ML IJ SOLN
0.6250 mg | Freq: Once | INTRAMUSCULAR | Status: AC | PRN
  Administered 2023-05-07: 0.625 mg via INTRAVENOUS

## 2023-05-07 MED ORDER — SULFAMETHOXAZOLE-TRIMETHOPRIM 800-160 MG PO TABS
1.0000 | ORAL_TABLET | Freq: Two times a day (BID) | ORAL | 0 refills | Status: AC
  Filled 2023-05-07: qty 14, 7d supply, fill #0

## 2023-05-07 MED ORDER — OXYCODONE HCL 5 MG PO TABS
ORAL_TABLET | ORAL | Status: AC
  Filled 2023-05-07: qty 1

## 2023-05-07 MED ORDER — EPHEDRINE 5 MG/ML INJ
INTRAVENOUS | Status: AC
  Filled 2023-05-07: qty 5

## 2023-05-07 MED ORDER — DEXAMETHASONE SODIUM PHOSPHATE 10 MG/ML IJ SOLN
INTRAMUSCULAR | Status: DC | PRN
  Administered 2023-05-07: 4 mg via INTRAVENOUS

## 2023-05-07 MED ORDER — VANCOMYCIN HCL 1500 MG/300ML IV SOLN
1500.0000 mg | INTRAVENOUS | Status: AC
  Administered 2023-05-07: 1500 mg via INTRAVENOUS
  Filled 2023-05-07: qty 300

## 2023-05-07 MED ORDER — MEPERIDINE HCL 50 MG/ML IJ SOLN: 6.2500 mg | INTRAMUSCULAR | Status: DC | PRN

## 2023-05-07 MED ORDER — EPHEDRINE SULFATE-NACL 50-0.9 MG/10ML-% IV SOSY
PREFILLED_SYRINGE | INTRAVENOUS | Status: DC | PRN
  Administered 2023-05-07: 5 mg via INTRAVENOUS

## 2023-05-07 MED ORDER — DROPERIDOL 2.5 MG/ML IJ SOLN
INTRAMUSCULAR | Status: AC
  Filled 2023-05-07: qty 2

## 2023-05-07 MED ORDER — MIDAZOLAM HCL 2 MG/2ML IJ SOLN
INTRAMUSCULAR | Status: DC | PRN
  Administered 2023-05-07: 2 mg via INTRAVENOUS

## 2023-05-07 MED ORDER — FLUCONAZOLE IN SODIUM CHLORIDE 200-0.9 MG/100ML-% IV SOLN
200.0000 mg | INTRAVENOUS | Status: DC
  Administered 2023-05-07: 200 mg via INTRAVENOUS
  Filled 2023-05-07: qty 100

## 2023-05-07 MED ORDER — ACETAMINOPHEN 500 MG PO TABS
1000.0000 mg | ORAL_TABLET | Freq: Once | ORAL | Status: AC
  Administered 2023-05-07: 1000 mg via ORAL
  Filled 2023-05-07: qty 2

## 2023-05-07 MED ORDER — LIDOCAINE HCL 1 % IJ SOLN
INTRAMUSCULAR | Status: AC
  Filled 2023-05-07: qty 20

## 2023-05-07 MED ORDER — FENTANYL CITRATE (PF) 250 MCG/5ML IJ SOLN
INTRAMUSCULAR | Status: AC
  Filled 2023-05-07: qty 5

## 2023-05-07 MED ORDER — ORAL CARE MOUTH RINSE: 15.0000 mL | Freq: Once | OROMUCOSAL | Status: AC

## 2023-05-07 MED ORDER — LIDOCAINE HCL 1 % IJ SOLN
INTRAMUSCULAR | Status: DC | PRN
  Administered 2023-05-07: 10 mL

## 2023-05-07 MED ORDER — CHLORHEXIDINE GLUCONATE 4 % EX SOLN: Freq: Once | CUTANEOUS | Status: DC

## 2023-05-07 MED ORDER — STERILE WATER FOR IRRIGATION IR SOLN
Status: DC | PRN
  Administered 2023-05-07: 1000 mL

## 2023-05-07 MED ORDER — PHENYLEPHRINE HCL (PRESSORS) 10 MG/ML IV SOLN
INTRAVENOUS | Status: AC
Start: 2023-05-07 — End: ?
  Filled 2023-05-07: qty 1

## 2023-05-07 MED ORDER — BUPIVACAINE HCL (PF) 0.5 % IJ SOLN
INTRAMUSCULAR | Status: AC
Start: 2023-05-07 — End: ?
  Filled 2023-05-07: qty 30

## 2023-05-07 MED ORDER — FENTANYL CITRATE (PF) 100 MCG/2ML IJ SOLN
INTRAMUSCULAR | Status: DC | PRN
  Administered 2023-05-07: 25 ug via INTRAVENOUS
  Administered 2023-05-07: 75 ug via INTRAVENOUS
  Administered 2023-05-07 (×3): 50 ug via INTRAVENOUS

## 2023-05-07 MED ORDER — PROPOFOL 10 MG/ML IV BOLUS
INTRAVENOUS | Status: DC | PRN
  Administered 2023-05-07: 150 mg via INTRAVENOUS

## 2023-05-07 MED ORDER — PROPOFOL 10 MG/ML IV BOLUS
INTRAVENOUS | Status: AC
  Filled 2023-05-07: qty 20

## 2023-05-07 MED ORDER — LIDOCAINE 2% (20 MG/ML) 5 ML SYRINGE
INTRAMUSCULAR | Status: DC | PRN
  Administered 2023-05-07: 60 mg via INTRAVENOUS

## 2023-05-07 MED ORDER — SODIUM CHLORIDE 0.9 % IR SOLN
Status: DC | PRN
  Administered 2023-05-07: 1000 mL

## 2023-05-07 MED ORDER — ONDANSETRON HCL 4 MG/2ML IJ SOLN
INTRAMUSCULAR | Status: DC | PRN
  Administered 2023-05-07: 4 mg via INTRAVENOUS

## 2023-05-07 MED ORDER — MIDAZOLAM HCL 2 MG/2ML IJ SOLN
INTRAMUSCULAR | Status: AC
Start: 2023-05-07 — End: ?
  Filled 2023-05-07: qty 2

## 2023-05-07 MED ORDER — PHENYLEPHRINE HCL (PRESSORS) 10 MG/ML IV SOLN
INTRAVENOUS | Status: AC
  Filled 2023-05-07: qty 1

## 2023-05-07 MED ORDER — DEXAMETHASONE SODIUM PHOSPHATE 10 MG/ML IJ SOLN
INTRAMUSCULAR | Status: AC
  Filled 2023-05-07: qty 1

## 2023-05-07 MED ORDER — LACTATED RINGERS IV SOLN: INTRAVENOUS | Status: DC

## 2023-05-07 MED ORDER — LIDOCAINE HCL (PF) 2 % IJ SOLN
INTRAMUSCULAR | Status: AC
  Filled 2023-05-07: qty 5

## 2023-05-07 MED ORDER — CELECOXIB 200 MG PO CAPS
200.0000 mg | ORAL_CAPSULE | Freq: Two times a day (BID) | ORAL | 1 refills | Status: AC
  Filled 2023-05-07: qty 14, 7d supply, fill #0

## 2023-05-07 MED ORDER — HYDROMORPHONE HCL 1 MG/ML IJ SOLN: 0.2500 mg | INTRAMUSCULAR | Status: DC | PRN

## 2023-05-07 MED ORDER — BUPIVACAINE HCL 0.5 % IJ SOLN
INTRAMUSCULAR | Status: DC | PRN
  Administered 2023-05-07: 10 mL

## 2023-05-07 SURGICAL SUPPLY — 49 items
BAG URINE DRAIN 2000ML AR STRL (UROLOGICAL SUPPLIES) IMPLANT
BLADE SURG 15 STRL LF DISP TIS (BLADE) ×1 IMPLANT
BNDG GAUZE DERMACEA FLUFF 4 (GAUZE/BANDAGES/DRESSINGS) ×1 IMPLANT
BRIEF MESH DISP LRG (UNDERPADS AND DIAPERS) ×1 IMPLANT
CATH COUDE 5CC RIBBED (CATHETERS) ×1 IMPLANT
CHLORAPREP W/TINT 26 (MISCELLANEOUS) ×2 IMPLANT
COVER MAYO STAND STRL (DRAPES) ×1 IMPLANT
COVER SURGICAL LIGHT HANDLE (MISCELLANEOUS) ×1 IMPLANT
DERMABOND ADVANCED .7 DNX12 (GAUZE/BANDAGES/DRESSINGS) ×1 IMPLANT
DRAIN CHANNEL 10F 3/8 F FF (DRAIN) IMPLANT
DRAPE INCISE IOBAN 66X45 STRL (DRAPES) ×1 IMPLANT
DRAPE LAPAROTOMY T 98X78 PEDS (DRAPES) ×1 IMPLANT
DRSG TEGADERM 4X4.75 (GAUZE/BANDAGES/DRESSINGS) ×1 IMPLANT
ELECT REM PT RETURN 15FT ADLT (MISCELLANEOUS) ×1 IMPLANT
EVACUATOR SILICONE 100CC (DRAIN) IMPLANT
GLOVE BIO SURGEON STRL SZ7 (GLOVE) ×1 IMPLANT
GLOVE BIOGEL PI IND STRL 7.0 (GLOVE) ×1 IMPLANT
GOWN STRL REUS W/ TWL XL LVL3 (GOWN DISPOSABLE) ×1 IMPLANT
HOLDER FOLEY CATH W/STRAP (MISCELLANEOUS) IMPLANT
IMPL RTE STACKING CX LGX1.5 (Breast) IMPLANT
IMPLANT RTE STACKING CX LGX1.5 (Breast) ×1 IMPLANT
JET LAVAGE IRRISEPT WOUND (IRRIGATION / IRRIGATOR)
KIT ACCESSORY AMS 700 PUMP (Urological Implant) IMPLANT
KIT BASIN OR (CUSTOM PROCEDURE TRAY) ×1 IMPLANT
KIT TURNOVER KIT A (KITS) IMPLANT
LAVAGE JET IRRISEPT WOUND (IRRIGATION / IRRIGATOR) IMPLANT
NDL HYPO 22X1.5 SAFETY MO (MISCELLANEOUS) ×1 IMPLANT
NEEDLE HYPO 22X1.5 SAFETY MO (MISCELLANEOUS) ×1
NS IRRIG 1000ML POUR BTL (IV SOLUTION) ×1 IMPLANT
PACK GENERAL/GYN (CUSTOM PROCEDURE TRAY) ×1 IMPLANT
PLUG CATH AND CAP STRL 200 (CATHETERS) ×1 IMPLANT
PUMP PENILE AMS 700CX MS 12X21 (Erectile Restoration) IMPLANT
RESERVOIR FLAT IZ 100ML (Miscellaneous) IMPLANT
RETRACTOR DEEP SCROTAL PENILE (MISCELLANEOUS) IMPLANT
RETRACTOR WILSON SYSTEM (INSTRUMENTS) IMPLANT
SET COLLECT BLD 21X.75 12 PB G (NEEDLE) ×1 IMPLANT
SURGILUBE 2OZ TUBE FLIPTOP (MISCELLANEOUS) IMPLANT
SUT ETHILON 3 0 PS 1 (SUTURE) IMPLANT
SUT MNCRL AB 4-0 PS2 18 (SUTURE) ×1 IMPLANT
SUT VIC AB 2-0 UR5 27 (SUTURE) IMPLANT
SUT VIC AB 2-0 UR6 27 (SUTURE) ×4 IMPLANT
SUT VIC AB 3-0 SH 27X BRD (SUTURE) ×2 IMPLANT
SYR 10ML LL (SYRINGE) ×2 IMPLANT
SYR 20ML LL LF (SYRINGE) ×1 IMPLANT
SYR 50ML LL SCALE MARK (SYRINGE) ×3 IMPLANT
SYR CONTROL 10ML LL (SYRINGE) ×1 IMPLANT
TOWEL GREEN STERILE FF (TOWEL DISPOSABLE) ×1 IMPLANT
TOWEL OR 17X26 10 PK STRL BLUE (TOWEL DISPOSABLE) ×1 IMPLANT
WATER STERILE IRR 500ML POUR (IV SOLUTION) ×1 IMPLANT

## 2023-05-07 NOTE — Anesthesia Procedure Notes (Signed)
 Procedure Name: LMA Insertion Date/Time: 05/07/2023 7:40 AM  Performed by: Dasie Nena PARAS, CRNAPre-anesthesia Checklist: Patient identified, Emergency Drugs available, Suction available, Patient being monitored and Timeout performed Patient Re-evaluated:Patient Re-evaluated prior to induction Oxygen Delivery Method: Circle system utilized Preoxygenation: Pre-oxygenation with 100% oxygen Induction Type: IV induction Ventilation: Mask ventilation without difficulty LMA: LMA inserted LMA Size: 5.0 Number of attempts: 2 Placement Confirmation: positive ETCO2 and breath sounds checked- equal and bilateral Tube secured with: Tape Dental Injury: Teeth and Oropharynx as per pre-operative assessment

## 2023-05-07 NOTE — Transfer of Care (Signed)
 Immediate Anesthesia Transfer of Care Note  Patient: Karl [place]  Procedure(s) Performed: INSERTION OF INFLATABLE PENILE PROSTHESIS (Scrotum)  Patient Location: PACU  Anesthesia Type:General  Level of Consciousness: awake and alert   Airway & Oxygen Therapy: Patient Spontanous Breathing and c/o need to urinate - urinal in place.    Post-op Assessment: Report given to RN and Post -op Vital signs reviewed and stable  Post vital signs: Reviewed and stable  Last Vitals:  Vitals Value Taken Time  BP 147/90 05/07/23 0935  Temp    Pulse 74 05/07/23 0938  Resp 18 05/07/23 0938  SpO2 92 % 05/07/23 0938  Vitals shown include unfiled device data.  Last Pain:  Vitals:   05/07/23 0541  TempSrc: Oral  PainSc:          Complications: No notable events documented.

## 2023-05-07 NOTE — Discharge Instructions (Signed)

## 2023-05-07 NOTE — Op Note (Signed)
 PATIENT:  Karl Morgan  PRE-OPERATIVE DIAGNOSIS:  Organic erectile dysfunction  POST-OPERATIVE DIAGNOSIS:  Same  PROCEDURE:   3 piece inflatable penile prosthesis (BS/AMS) Injection of pharmacoagent into penis  SURGEON:  Herlene Foot MD  ASST: Lynwood Butters, MD  INDICATION: He has had long-standing organic erectile dysfunction and refractory to other modes of treatment. He has elected to proceed with prosthesis implantation.  ANESTHESIA:  General  EBL:  Minimal  Device: 3 piece AMS CX 700: 100 cc reservoir, 21 cm cylinders and 1.5 cm rear-tip extenders on right and left sides  LOCAL MEDICATIONS USED:  None  SPECIMEN: None  DISPOSITION OF SPECIMEN:  N/A  Description of procedure: The patient was taken to the major operating room, placed on the table and administered general anesthesia in the supine position. His genitalia was then prepped with chlorhexidine  x 2. He was draped in the usual sterile fashion, and I used Ioban on the field. An official timeout was then performed.  A dorsal penile block was performed. A butterfly needle was then used to inject normal saline into the penis to give an artificial erection. There was no clinically significant curvature or deformity. I then injected 10 cc of lidocaine /marcaine  into the penis.   A 14 French coude catheter was then placed in the bladder and the bladder was drained and the catheter was plugged. A midline penoscrotal incision was then made and the dissection was carried down to the corpora and urethra. The lonestar retractor was positioned so as to have excellent exposure. 2-0 Vicryl sutures were then placed proximally in each corpus cavernosum to serve as stay sutures. An incision was then made in the corpus cavernosum first on the left-hand side with the bovie. Tamra were used to gently dilate the opening. I then dilated the corpus cavernosum with the a 12 Fr brooks dilator distally and proximally. Field goal post tests were  performed and there was no evidence of perforations or crossover. I then irrigated the corpus cavernosum with antibiotic solution and measured the distance proximally and distally from the stay suture and was found to be 11.5 and 11 cm, respectively.I then turned my attention to the contralateral corpus cavernosum and placed my stay sutures, made my corporotomy and dilated the corpus cavernosum in an identical fashion. This was measured and also was found to be 12 cm proximally and 10 distally. It was irrigated with anastomotic solution as was the scrotum. I then chose an 21 cm cylinder set with 1.5 cm rear-tip extenders and these were prepped while I prepared the site for reservoir placement.  I then digitally probed into the Left external inguinal ring. My finger was used to poke through the posterior wall of the ring. I used my finger to ensure I was in the appropriate space, and to clear room for the reservoir. I irrigated the space with anastomotic solution and then placed the reservoir in this location. I then filled the reservoir with 98 cc of sterile saline, and checked to confirm proper position. There was minimal backpressure with the reservoir max-filled.  Attention was redirected to the corporotomies where the cylinders were then placed by first fixing the suture to the distal aspect of the right cylinder to a straight needle. This was then loaded on the [facility] inserter and passed through the corporotomy and distally. I then advanced the straight needle with the Furlow inserter out through the glans and this was grasped with a hemostat and pulled through the glans and the suture was  secured with a hemostat. I then performed an identical maneuver on the contralateral side. After this was performed I irrigated both corpus cavernosum; there was no evidence of urethral perforation. I inserted the distal portion of the cylinder through the corporotomies and pulled this to the end of the corpora with the  suture. The proximal aspect with the rear-tip extender was then passed through the corporotomy and into the seated position on each side. I then connected reservoir tubing to a syringe filled with sterile saline and inflated the device. I noted a good straight erection with both cylinders equidistant under the glans and no buckling of the cylinders. I therefore deflated the device and closed the corporotomies with used my previously placed stay sutures.   I then grasped the scrotal skin in the midline with a babcock, and used a hemostat to dissect down to the dependent-most portion of the scrotum. The nasal speculum was inserted into this space, and facilitated placement of the pump. The cylinder was then connected to the pump after excising the excess tubing with appropriate shodded hemostats in place and then I used the supplied connectors to make the connection. I then again cycled the device with the pump and it cycled properly. I deflated the device and pumped it up about three quarters of the way to aid with hemostasis. I irrigated the wound one last time with antibiotic irrigation and then closed the deep scrotal tissue over the tubing and pump with running 3-0 monocryl suture. I placed a 10 Fr blake drain over the corporotomies. A second layer was then closed over this first layer with running 3- 0 monocryl, and running skin suture w/ 4-0 monocryl performed. Incision dressed with dermabond.  A mummy wrap was applied. The catheter was connected to closed system drainage, and drain connected to suction bulb and the patient was awakened and taken recovery room in stable and satisfactory condition. He tolerated the procedure well and there were no intraoperative complications. Needle sponge and instrument counts were correct at the end of the operation.

## 2023-05-07 NOTE — Anesthesia Postprocedure Evaluation (Signed)
 Anesthesia Post Note  Patient: Karl Morgan  Procedure(s) Performed: INSERTION OF INFLATABLE PENILE PROSTHESIS (Scrotum)     Patient location during evaluation: PACU Anesthesia Type: General Level of consciousness: sedated and patient cooperative Pain management: pain level controlled Vital Signs Assessment: post-procedure vital signs reviewed and stable Respiratory status: spontaneous breathing Cardiovascular status: stable Anesthetic complications: no   No notable events documented.  Last Vitals:  Vitals:   05/07/23 1000 05/07/23 1032  BP: (!) 143/95 (!) 132/90  Pulse: (!) 56 (!) 56  Resp: 10   Temp: 36.7 C   SpO2: 99% 96%    Last Pain:  Vitals:   05/07/23 1000  TempSrc:   PainSc: 2                  [name]

## 2023-05-07 NOTE — H&P (Signed)
 H&P  History of Present Illness: Karl Morgan is a 63 y.o. year old M who presents today for insertion of an inflatable penile prosthesis  No acute complaints  Past Medical History:  Diagnosis Date   Alopecia 04/04/2011   Arthritis    Diabetes mellitus    Erectile dysfunction 04/04/2011   GERD (gastroesophageal reflux disease)    Heart attack (HCC) 10/07/2014   Overview:  Status post STEMI, anterior myocardial infarction status post DES stent x 2 to the LAD, mid and distal.    Heart murmur    as a chld   Hyperlipidemia 04/04/2011   Hypogonadism male 04/04/2011   Myocardial infarction [facility])    Sleep apnea    Stroke (HCC)    x 3 as of 05/2021 last stroke 12/17/20 with aphasia   Type II or unspecified type diabetes mellitus without mention of complication, uncontrolled 04/04/2011   Unspecified essential hypertension 04/04/2011    Past Surgical History:  Procedure Laterality Date   BUBBLE STUDY  08/03/2019   Procedure: BUBBLE STUDY;  Surgeon: Okey Vina GAILS, MD;  Location: [facility] ENDOSCOPY;  Service: Cardiovascular;;   CARDIAC CATHETERIZATION     cardiac stents      COLONOSCOPY     IR ANGIO INTRA EXTRACRAN SEL COM CAROTID INNOMINATE UNI L MOD SED  12/17/2020   IR CT HEAD LTD  12/17/2020   IR PERCUTANEOUS ART THROMBECTOMY/INFUSION INTRACRANIAL INC DIAG ANGIO  12/17/2020   IR US  GUIDE VASC ACCESS RIGHT  12/17/2020   LOOP RECORDER INSERTION N/A 08/03/2019   Procedure: LOOP RECORDER INSERTION;  Surgeon: Waddell Danelle ORN, MD;  Location: [facility] INVASIVE CV LAB;  Service: Cardiovascular;  Laterality: N/A;   RADIOLOGY WITH ANESTHESIA N/A 12/17/2020   Procedure: IR WITH ANESTHESIA;  Surgeon: Dolphus Carrion, MD;  Location: [facility] OR;  Service: Radiology;  Laterality: N/A;   TEE WITHOUT CARDIOVERSION N/A 08/03/2019   Procedure: TRANSESOPHAGEAL ECHOCARDIOGRAM (TEE);  Surgeon: Okey Vina GAILS, MD;  Location: [facility] ENDOSCOPY;  Service: Cardiovascular;  Laterality: N/A;    Home Medications:  Current  Meds  Medication Sig   alprostadil (EDEX) 40 MCG injection 40 mcg by Intracavitary route as needed for erectile dysfunction. use no more than 3 times per week   apixaban  (ELIQUIS ) 5 MG TABS tablet Take 1 tablet (5 mg total) by mouth every 12 (twelve) hours.   atorvastatin  (LIPITOR ) 80 MG tablet Take 1 tablet (80 mg total) by mouth daily.   carvedilol  (COREG ) 6.25 MG tablet Take 1 tablet (6.25 mg total) by mouth 2 (two) times daily with a meal.   ezetimibe  (ZETIA ) 10 MG tablet Take 1 tablet (10 mg total) by mouth daily.   FARXIGA  10 MG TABS tablet Take 1 tablet (10 mg total) by mouth daily.   glimepiride  (AMARYL ) 2 MG tablet Take 1 tablet (2 mg total) by mouth 2 (two) times daily.   levothyroxine  (SYNTHROID ) 75 MCG tablet Take 1 tablet (75 mcg total) by mouth daily.   lisinopril  (ZESTRIL ) 40 MG tablet Take 1 tablet (40 mg total) by mouth daily.   metFORMIN  (GLUCOPHAGE ) 850 MG tablet Take 1 tablet (850 mg total) by mouth 2 (two) times daily.   Multiple Vitamins-Minerals (MULTIVITAMIN WITH MINERALS) tablet Take 1 tablet by mouth daily. One a day Men 50+   pantoprazole  (PROTONIX ) 40 MG tablet Take 1 tablet (40 mg total) by mouth daily. 30 min before food with lunch or dinner   sildenafil  (VIAGRA ) 100 MG tablet Take 100 mg by mouth daily as needed  for erectile dysfunction.   valACYclovir  (VALTREX ) 1000 MG tablet take 1 to 2 tablets by mouth every 12 hours for 2 to 3 days as needed for fever blisters   [DISCONTINUED] carvedilol  (COREG ) 6.25 MG tablet Take 1 tablet (6.25 mg total) by mouth 2 (two) times daily with a meal.    Allergies:  Allergies  Allergen Reactions   Mounjaro  [Tirzepatide ]     Constipation/diarrhea    Other Other (See Comments)    Rybelsus  ab pain at 3 mg, 7 mg n/v   Pravachol [Pravastatin Sodium] Other (See Comments)    Muscle aches    Family History  Problem Relation Age of Onset   Hypertension Father    Dementia Father        died age 65 in 03/28/2018   Memory loss Father     Emphysema Mother    COPD Mother    Thyroid  disease Mother    Cancer Neg Hx    Diabetes Neg Hx    Heart disease Neg Hx    Stroke Neg Hx     Social History:  reports that he has never smoked. He has never used smokeless tobacco. He reports that he does not drink alcohol and does not use drugs.  ROS: A complete review of systems was performed.  All systems are negative except for pertinent findings as noted.  Physical Exam:  Vital signs in last 24 hours: Temp:  [98.3 F (36.8 C)] 98.3 F (36.8 C) (02/04 0541) Pulse Rate:  [79] 79 (02/04 0541) Resp:  [16] 16 (02/04 0541) BP: (162)/(85) 162/85 (02/04 0541) SpO2:  [98 %] 98 % (02/04 0541) Weight:  [104.3 kg] 104.3 kg (02/04 0539) Constitutional:  Alert and oriented, No acute distress Cardiovascular: Regular rate and rhythm Respiratory: Normal respiratory effort, Lungs clear bilaterally GI: Abdomen is soft, nontender, nondistended, no abdominal masses Lymphatic: No lymphadenopathy Neurologic: Grossly intact, no focal deficits Psychiatric: Normal mood and affect   Laboratory Data:  No results for input(s): WBC, HGB, HCT, PLT in the last 72 hours.  No results for input(s): NA, K, CL, GLUCOSE, BUN, CALCIUM , CREATININE in the last 72 hours.  Invalid input(s): CO3   Results for orders placed or performed during the hospital encounter of 05/07/23 (from the past 24 hours)  Glucose, capillary     Status: Abnormal   Collection Time: 05/07/23  5:38 AM  Result Value Ref Range   Glucose-Capillary 199 (H) 70 - 99 mg/dL   Comment 1 Notify RN    Comment 2 Document in Chart    No results found for this or any previous visit (from the past 240 hours).  Renal Function: No results for input(s): CREATININE in the last 168 hours. Estimated Creatinine Clearance: 77.4 mL/min (by C-G formula based on SCr of 1.22 mg/dL).  Radiologic Imaging: No results found.  Assessment:  Karl Morgan is a 63 y.o. year old M  with ED refractory to other medical treatments  Plan:  To OR as planned for IPP. Procedure and risks reviewed, including but not limited to bleeding, infection, implant infection, implant malfunction, implant malplacement, erosion, damage to adjacent structures, pain, urinary retention. All questions answered   Herlene Foot, MD 05/07/2023, 7:19 AM  [facility] Pager: [phone]

## 2023-05-10 ENCOUNTER — Encounter (HOSPITAL_COMMUNITY): Payer: Self-pay | Admitting: Urology

## 2023-05-10 DIAGNOSIS — N529 Male erectile dysfunction, unspecified: Secondary | ICD-10-CM | POA: Diagnosis not present

## 2023-05-29 ENCOUNTER — Other Ambulatory Visit (HOSPITAL_COMMUNITY): Payer: Self-pay

## 2023-05-30 ENCOUNTER — Ambulatory Visit (INDEPENDENT_AMBULATORY_CARE_PROVIDER_SITE_OTHER): Payer: Commercial Managed Care - PPO

## 2023-05-30 DIAGNOSIS — I6340 Cerebral infarction due to embolism of unspecified cerebral artery: Secondary | ICD-10-CM

## 2023-05-30 LAB — CUP PACEART REMOTE DEVICE CHECK
Date Time Interrogation Session: 20250227054052
Implantable Pulse Generator Implant Date: 20210503

## 2023-06-02 ENCOUNTER — Encounter: Payer: Self-pay | Admitting: Internal Medicine

## 2023-06-05 NOTE — Progress Notes (Signed)
 [facility] Summary Report / Loop Recorder

## 2023-06-10 ENCOUNTER — Other Ambulatory Visit (HOSPITAL_COMMUNITY): Payer: Self-pay

## 2023-06-17 DIAGNOSIS — E1149 Type 2 diabetes mellitus with other diabetic neurological complication: Secondary | ICD-10-CM | POA: Diagnosis not present

## 2023-06-17 DIAGNOSIS — R5381 Other malaise: Secondary | ICD-10-CM | POA: Diagnosis not present

## 2023-06-17 DIAGNOSIS — I10 Essential (primary) hypertension: Secondary | ICD-10-CM | POA: Diagnosis not present

## 2023-06-24 ENCOUNTER — Other Ambulatory Visit (HOSPITAL_COMMUNITY): Payer: Self-pay

## 2023-07-01 ENCOUNTER — Other Ambulatory Visit (HOSPITAL_COMMUNITY): Payer: Self-pay

## 2023-07-01 NOTE — Progress Notes (Signed)
 [facility] Summary Report / Loop Recorder

## 2023-07-04 ENCOUNTER — Ambulatory Visit (INDEPENDENT_AMBULATORY_CARE_PROVIDER_SITE_OTHER)

## 2023-07-04 DIAGNOSIS — I6340 Cerebral infarction due to embolism of unspecified cerebral artery: Secondary | ICD-10-CM

## 2023-07-04 LAB — CUP PACEART REMOTE DEVICE CHECK
Date Time Interrogation Session: 20250402230330
Implantable Pulse Generator Implant Date: 20210503

## 2023-07-05 ENCOUNTER — Encounter: Payer: Self-pay | Admitting: Internal Medicine

## 2023-07-22 ENCOUNTER — Other Ambulatory Visit (HOSPITAL_COMMUNITY): Payer: Self-pay

## 2023-07-29 ENCOUNTER — Other Ambulatory Visit (HOSPITAL_COMMUNITY): Payer: Self-pay

## 2023-08-05 ENCOUNTER — Other Ambulatory Visit (HOSPITAL_COMMUNITY): Payer: Self-pay

## 2023-08-06 ENCOUNTER — Other Ambulatory Visit: Payer: Self-pay

## 2023-08-08 ENCOUNTER — Ambulatory Visit (INDEPENDENT_AMBULATORY_CARE_PROVIDER_SITE_OTHER)

## 2023-08-08 DIAGNOSIS — M7701 Medial epicondylitis, right elbow: Secondary | ICD-10-CM | POA: Diagnosis not present

## 2023-08-08 DIAGNOSIS — I6340 Cerebral infarction due to embolism of unspecified cerebral artery: Secondary | ICD-10-CM | POA: Diagnosis not present

## 2023-08-08 DIAGNOSIS — M130 Polyarthritis, unspecified: Secondary | ICD-10-CM | POA: Diagnosis not present

## 2023-08-08 DIAGNOSIS — Z860100 Personal history of colon polyps, unspecified: Secondary | ICD-10-CM | POA: Diagnosis not present

## 2023-08-08 LAB — CUP PACEART REMOTE DEVICE CHECK
Date Time Interrogation Session: 20250507230116
Implantable Pulse Generator Implant Date: 20210503

## 2023-08-12 ENCOUNTER — Encounter: Payer: Self-pay | Admitting: Internal Medicine

## 2023-08-12 NOTE — Progress Notes (Signed)
 [facility] Summary Report / Loop Recorder

## 2023-08-29 ENCOUNTER — Other Ambulatory Visit (HOSPITAL_COMMUNITY): Payer: Self-pay

## 2023-08-29 ENCOUNTER — Other Ambulatory Visit: Payer: Self-pay

## 2023-09-05 ENCOUNTER — Other Ambulatory Visit (HOSPITAL_COMMUNITY): Payer: Self-pay

## 2023-09-05 MED ORDER — CARVEDILOL 6.25 MG PO TABS
6.2500 mg | ORAL_TABLET | Freq: Two times a day (BID) | ORAL | 3 refills | Status: DC
  Filled 2023-09-05: qty 180, 90d supply, fill #0

## 2023-09-06 ENCOUNTER — Other Ambulatory Visit: Payer: Self-pay

## 2023-09-06 ENCOUNTER — Other Ambulatory Visit (HOSPITAL_COMMUNITY): Payer: Self-pay

## 2023-09-11 DIAGNOSIS — L570 Actinic keratosis: Secondary | ICD-10-CM | POA: Diagnosis not present

## 2023-09-11 DIAGNOSIS — L578 Other skin changes due to chronic exposure to nonionizing radiation: Secondary | ICD-10-CM | POA: Diagnosis not present

## 2023-09-11 DIAGNOSIS — L821 Other seborrheic keratosis: Secondary | ICD-10-CM | POA: Diagnosis not present

## 2023-09-12 ENCOUNTER — Ambulatory Visit (INDEPENDENT_AMBULATORY_CARE_PROVIDER_SITE_OTHER)

## 2023-09-12 ENCOUNTER — Encounter

## 2023-09-12 DIAGNOSIS — I6340 Cerebral infarction due to embolism of unspecified cerebral artery: Secondary | ICD-10-CM

## 2023-09-12 LAB — CUP PACEART REMOTE DEVICE CHECK
Date Time Interrogation Session: 20250611230325
Implantable Pulse Generator Implant Date: 20210503

## 2023-09-13 NOTE — Progress Notes (Signed)
 [facility] Summary Report / Loop Recorder

## 2023-09-15 ENCOUNTER — Ambulatory Visit: Payer: Self-pay | Admitting: Internal Medicine

## 2023-09-16 ENCOUNTER — Other Ambulatory Visit (HOSPITAL_COMMUNITY): Payer: Self-pay

## 2023-10-01 ENCOUNTER — Other Ambulatory Visit (HOSPITAL_COMMUNITY): Payer: Self-pay

## 2023-10-03 DIAGNOSIS — K5900 Constipation, unspecified: Secondary | ICD-10-CM | POA: Diagnosis not present

## 2023-10-14 ENCOUNTER — Ambulatory Visit: Payer: Self-pay | Admitting: Internal Medicine

## 2023-10-14 ENCOUNTER — Ambulatory Visit

## 2023-10-14 DIAGNOSIS — I6340 Cerebral infarction due to embolism of unspecified cerebral artery: Secondary | ICD-10-CM | POA: Diagnosis not present

## 2023-10-14 LAB — CUP PACEART REMOTE DEVICE CHECK
Date Time Interrogation Session: 20250713231314
Implantable Pulse Generator Implant Date: 20210503

## 2023-10-16 ENCOUNTER — Other Ambulatory Visit (HOSPITAL_COMMUNITY): Payer: Self-pay

## 2023-10-16 DIAGNOSIS — I152 Hypertension secondary to endocrine disorders: Secondary | ICD-10-CM | POA: Diagnosis not present

## 2023-10-16 DIAGNOSIS — E1159 Type 2 diabetes mellitus with other circulatory complications: Secondary | ICD-10-CM | POA: Diagnosis not present

## 2023-10-16 DIAGNOSIS — Z794 Long term (current) use of insulin: Secondary | ICD-10-CM | POA: Diagnosis not present

## 2023-10-16 DIAGNOSIS — E1165 Type 2 diabetes mellitus with hyperglycemia: Secondary | ICD-10-CM | POA: Diagnosis not present

## 2023-10-16 DIAGNOSIS — E039 Hypothyroidism, unspecified: Secondary | ICD-10-CM | POA: Diagnosis not present

## 2023-10-16 DIAGNOSIS — E1169 Type 2 diabetes mellitus with other specified complication: Secondary | ICD-10-CM | POA: Diagnosis not present

## 2023-10-16 DIAGNOSIS — E785 Hyperlipidemia, unspecified: Secondary | ICD-10-CM | POA: Diagnosis not present

## 2023-10-16 MED ORDER — CARVEDILOL 12.5 MG PO TABS
12.5000 mg | ORAL_TABLET | Freq: Two times a day (BID) | ORAL | 4 refills | Status: AC
  Filled 2023-10-16: qty 180, 90d supply, fill #0

## 2023-10-17 ENCOUNTER — Encounter

## 2023-10-21 ENCOUNTER — Other Ambulatory Visit (HOSPITAL_COMMUNITY): Payer: Self-pay

## 2023-10-28 ENCOUNTER — Other Ambulatory Visit: Payer: Self-pay

## 2023-11-05 NOTE — Progress Notes (Signed)
 [facility] Summary Report / Loop Recorder

## 2023-11-06 ENCOUNTER — Other Ambulatory Visit (HOSPITAL_COMMUNITY): Payer: Self-pay

## 2023-11-06 ENCOUNTER — Other Ambulatory Visit: Payer: Self-pay

## 2023-11-14 ENCOUNTER — Ambulatory Visit (INDEPENDENT_AMBULATORY_CARE_PROVIDER_SITE_OTHER)

## 2023-11-14 DIAGNOSIS — I6340 Cerebral infarction due to embolism of unspecified cerebral artery: Secondary | ICD-10-CM | POA: Diagnosis not present

## 2023-11-14 LAB — CUP PACEART REMOTE DEVICE CHECK
Date Time Interrogation Session: 20250813230453
Implantable Pulse Generator Implant Date: 20210503

## 2023-11-15 ENCOUNTER — Ambulatory Visit: Payer: Self-pay | Admitting: Internal Medicine

## 2023-11-18 ENCOUNTER — Other Ambulatory Visit (HOSPITAL_COMMUNITY): Payer: Self-pay

## 2023-11-21 ENCOUNTER — Encounter

## 2023-12-03 ENCOUNTER — Other Ambulatory Visit (HOSPITAL_COMMUNITY): Payer: Self-pay

## 2023-12-16 ENCOUNTER — Ambulatory Visit (INDEPENDENT_AMBULATORY_CARE_PROVIDER_SITE_OTHER)

## 2023-12-16 DIAGNOSIS — I6340 Cerebral infarction due to embolism of unspecified cerebral artery: Secondary | ICD-10-CM

## 2023-12-17 LAB — CUP PACEART REMOTE DEVICE CHECK
Date Time Interrogation Session: 20250914230315
Implantable Pulse Generator Implant Date: 20210503

## 2023-12-20 ENCOUNTER — Ambulatory Visit: Payer: Self-pay | Admitting: Internal Medicine

## 2023-12-23 NOTE — Progress Notes (Signed)
 Remote Loop Recorder Transmission

## 2023-12-24 ENCOUNTER — Other Ambulatory Visit (HOSPITAL_COMMUNITY): Payer: Self-pay

## 2023-12-25 ENCOUNTER — Other Ambulatory Visit (HOSPITAL_COMMUNITY): Payer: Self-pay

## 2023-12-25 NOTE — Progress Notes (Signed)
 Remote Loop Recorder Transmission

## 2023-12-26 ENCOUNTER — Encounter

## 2023-12-27 ENCOUNTER — Other Ambulatory Visit (HOSPITAL_COMMUNITY): Payer: Self-pay

## 2023-12-27 ENCOUNTER — Other Ambulatory Visit: Payer: Self-pay

## 2023-12-27 DIAGNOSIS — I10 Essential (primary) hypertension: Secondary | ICD-10-CM | POA: Diagnosis not present

## 2023-12-27 DIAGNOSIS — Z1331 Encounter for screening for depression: Secondary | ICD-10-CM | POA: Diagnosis not present

## 2023-12-27 DIAGNOSIS — Z136 Encounter for screening for cardiovascular disorders: Secondary | ICD-10-CM | POA: Diagnosis not present

## 2023-12-27 DIAGNOSIS — Z125 Encounter for screening for malignant neoplasm of prostate: Secondary | ICD-10-CM | POA: Diagnosis not present

## 2023-12-27 DIAGNOSIS — E669 Obesity, unspecified: Secondary | ICD-10-CM | POA: Diagnosis not present

## 2023-12-27 DIAGNOSIS — N183 Chronic kidney disease, stage 3 unspecified: Secondary | ICD-10-CM | POA: Diagnosis not present

## 2023-12-27 DIAGNOSIS — I639 Cerebral infarction, unspecified: Secondary | ICD-10-CM | POA: Diagnosis not present

## 2023-12-27 DIAGNOSIS — Z23 Encounter for immunization: Secondary | ICD-10-CM | POA: Diagnosis not present

## 2023-12-27 DIAGNOSIS — E1149 Type 2 diabetes mellitus with other diabetic neurological complication: Secondary | ICD-10-CM | POA: Diagnosis not present

## 2023-12-27 DIAGNOSIS — Z1159 Encounter for screening for other viral diseases: Secondary | ICD-10-CM | POA: Diagnosis not present

## 2023-12-27 DIAGNOSIS — Z114 Encounter for screening for human immunodeficiency virus [HIV]: Secondary | ICD-10-CM | POA: Diagnosis not present

## 2023-12-27 DIAGNOSIS — Z1330 Encounter for screening examination for mental health and behavioral disorders, unspecified: Secondary | ICD-10-CM | POA: Diagnosis not present

## 2023-12-27 DIAGNOSIS — I213 ST elevation (STEMI) myocardial infarction of unspecified site: Secondary | ICD-10-CM | POA: Diagnosis not present

## 2023-12-27 DIAGNOSIS — Z0000 Encounter for general adult medical examination without abnormal findings: Secondary | ICD-10-CM | POA: Diagnosis not present

## 2023-12-27 DIAGNOSIS — I2510 Atherosclerotic heart disease of native coronary artery without angina pectoris: Secondary | ICD-10-CM | POA: Diagnosis not present

## 2023-12-27 MED ORDER — PANTOPRAZOLE SODIUM 40 MG PO TBEC
40.0000 mg | DELAYED_RELEASE_TABLET | Freq: Every day | ORAL | 3 refills | Status: AC
  Filled 2023-12-27 – 2024-02-26 (×2): qty 90, 90d supply, fill #0

## 2023-12-27 MED ORDER — CARVEDILOL 12.5 MG PO TABS
12.5000 mg | ORAL_TABLET | Freq: Two times a day (BID) | ORAL | 3 refills | Status: AC
  Filled 2023-12-27: qty 180, 90d supply, fill #0

## 2023-12-27 MED ORDER — SILDENAFIL CITRATE 100 MG PO TABS
100.0000 mg | ORAL_TABLET | Freq: Every day | ORAL | 1 refills | Status: AC | PRN
  Filled 2023-12-27: qty 6, 30d supply, fill #0

## 2023-12-27 MED ORDER — VALACYCLOVIR HCL 1 G PO TABS
1000.0000 mg | ORAL_TABLET | Freq: Every day | ORAL | 1 refills | Status: AC | PRN
  Filled 2023-12-27: qty 30, 30d supply, fill #0

## 2023-12-27 MED ORDER — EZETIMIBE 10 MG PO TABS
10.0000 mg | ORAL_TABLET | Freq: Every day | ORAL | 3 refills | Status: AC
  Filled 2023-12-27 – 2024-01-27 (×2): qty 90, 90d supply, fill #0
  Filled 2024-04-29: qty 90, 90d supply, fill #1

## 2023-12-27 MED ORDER — ATORVASTATIN CALCIUM 80 MG PO TABS
80.0000 mg | ORAL_TABLET | Freq: Every day | ORAL | 3 refills | Status: AC
  Filled 2023-12-27: qty 90, 90d supply, fill #0
  Filled 2024-04-22: qty 90, 90d supply, fill #1

## 2023-12-27 MED ORDER — LISINOPRIL 40 MG PO TABS
40.0000 mg | ORAL_TABLET | Freq: Every day | ORAL | 3 refills | Status: AC
  Filled 2023-12-27: qty 90, 90d supply, fill #0
  Filled 2024-04-22: qty 90, 90d supply, fill #1

## 2024-01-01 ENCOUNTER — Other Ambulatory Visit: Payer: Self-pay

## 2024-01-01 ENCOUNTER — Other Ambulatory Visit (HOSPITAL_COMMUNITY): Payer: Self-pay

## 2024-01-07 ENCOUNTER — Other Ambulatory Visit (HOSPITAL_COMMUNITY): Payer: Self-pay

## 2024-01-08 NOTE — Progress Notes (Signed)
 Remote Loop Recorder Transmission

## 2024-01-15 ENCOUNTER — Ambulatory Visit (INDEPENDENT_AMBULATORY_CARE_PROVIDER_SITE_OTHER)

## 2024-01-15 DIAGNOSIS — I6340 Cerebral infarction due to embolism of unspecified cerebral artery: Secondary | ICD-10-CM

## 2024-01-16 ENCOUNTER — Other Ambulatory Visit: Payer: Self-pay

## 2024-01-16 ENCOUNTER — Ambulatory Visit: Payer: Self-pay | Admitting: Internal Medicine

## 2024-01-16 ENCOUNTER — Encounter

## 2024-01-16 DIAGNOSIS — K219 Gastro-esophageal reflux disease without esophagitis: Secondary | ICD-10-CM | POA: Diagnosis not present

## 2024-01-16 DIAGNOSIS — I4891 Unspecified atrial fibrillation: Secondary | ICD-10-CM | POA: Diagnosis not present

## 2024-01-16 DIAGNOSIS — I10 Essential (primary) hypertension: Secondary | ICD-10-CM | POA: Diagnosis not present

## 2024-01-16 DIAGNOSIS — I639 Cerebral infarction, unspecified: Secondary | ICD-10-CM | POA: Diagnosis not present

## 2024-01-16 DIAGNOSIS — I219 Acute myocardial infarction, unspecified: Secondary | ICD-10-CM | POA: Diagnosis not present

## 2024-01-16 DIAGNOSIS — N183 Chronic kidney disease, stage 3 unspecified: Secondary | ICD-10-CM | POA: Diagnosis not present

## 2024-01-16 DIAGNOSIS — I513 Intracardiac thrombosis, not elsewhere classified: Secondary | ICD-10-CM | POA: Diagnosis not present

## 2024-01-16 DIAGNOSIS — E669 Obesity, unspecified: Secondary | ICD-10-CM | POA: Diagnosis not present

## 2024-01-16 DIAGNOSIS — E1149 Type 2 diabetes mellitus with other diabetic neurological complication: Secondary | ICD-10-CM | POA: Diagnosis not present

## 2024-01-16 DIAGNOSIS — I25119 Atherosclerotic heart disease of native coronary artery with unspecified angina pectoris: Secondary | ICD-10-CM | POA: Diagnosis not present

## 2024-01-16 LAB — CUP PACEART REMOTE DEVICE CHECK
Date Time Interrogation Session: 20251014230509
Implantable Pulse Generator Implant Date: 20210503

## 2024-01-16 MED ORDER — AMLODIPINE BESYLATE 5 MG PO TABS
5.0000 mg | ORAL_TABLET | Freq: Two times a day (BID) | ORAL | 11 refills | Status: AC | PRN
  Filled 2024-01-16 (×2): qty 60, 30d supply, fill #0
  Filled 2024-02-18: qty 180, 90d supply, fill #1

## 2024-01-17 NOTE — Progress Notes (Signed)
 Remote Loop Recorder Transmission

## 2024-01-27 ENCOUNTER — Other Ambulatory Visit: Payer: Self-pay

## 2024-01-27 ENCOUNTER — Other Ambulatory Visit (HOSPITAL_COMMUNITY): Payer: Self-pay

## 2024-01-28 ENCOUNTER — Other Ambulatory Visit (HOSPITAL_COMMUNITY): Payer: Self-pay

## 2024-01-29 ENCOUNTER — Other Ambulatory Visit: Payer: Self-pay

## 2024-01-29 ENCOUNTER — Other Ambulatory Visit (HOSPITAL_COMMUNITY): Payer: Self-pay

## 2024-01-29 MED ORDER — GLIMEPIRIDE 2 MG PO TABS
2.0000 mg | ORAL_TABLET | Freq: Two times a day (BID) | ORAL | 3 refills | Status: AC
  Filled 2024-01-29 – 2024-04-06 (×2): qty 200, 100d supply, fill #0

## 2024-01-29 MED ORDER — METFORMIN HCL 850 MG PO TABS
850.0000 mg | ORAL_TABLET | Freq: Two times a day (BID) | ORAL | 3 refills | Status: AC
  Filled 2024-01-29: qty 200, 100d supply, fill #0
  Filled 2024-03-23: qty 180, 90d supply, fill #0

## 2024-01-29 MED ORDER — ELIQUIS 5 MG PO TABS
5.0000 mg | ORAL_TABLET | Freq: Two times a day (BID) | ORAL | 3 refills | Status: AC
  Filled 2024-01-29: qty 60, 30d supply, fill #0
  Filled 2024-01-30: qty 180, 90d supply, fill #0

## 2024-01-29 MED ORDER — FARXIGA 10 MG PO TABS
10.0000 mg | ORAL_TABLET | Freq: Every day | ORAL | 3 refills | Status: AC
  Filled 2024-01-29 – 2024-01-30 (×2): qty 90, 90d supply, fill #0
  Filled 2024-04-29 (×2): qty 90, 90d supply, fill #1

## 2024-01-29 MED ORDER — LEVOTHYROXINE SODIUM 75 MCG PO TABS
75.0000 ug | ORAL_TABLET | Freq: Every day | ORAL | 3 refills | Status: AC
  Filled 2024-01-29: qty 100, 100d supply, fill #0
  Filled 2024-03-23: qty 90, 90d supply, fill #0

## 2024-01-30 ENCOUNTER — Other Ambulatory Visit: Payer: Self-pay

## 2024-01-30 ENCOUNTER — Other Ambulatory Visit (HOSPITAL_COMMUNITY): Payer: Self-pay

## 2024-01-30 ENCOUNTER — Encounter

## 2024-02-06 ENCOUNTER — Other Ambulatory Visit: Payer: Self-pay

## 2024-02-15 ENCOUNTER — Encounter

## 2024-02-16 ENCOUNTER — Ambulatory Visit: Attending: Internal Medicine

## 2024-02-16 DIAGNOSIS — I6340 Cerebral infarction due to embolism of unspecified cerebral artery: Secondary | ICD-10-CM | POA: Diagnosis not present

## 2024-02-17 ENCOUNTER — Encounter

## 2024-02-17 LAB — CUP PACEART REMOTE DEVICE CHECK
Date Time Interrogation Session: 20251115230310
Implantable Pulse Generator Implant Date: 20210503

## 2024-02-18 ENCOUNTER — Other Ambulatory Visit: Payer: Self-pay

## 2024-02-18 NOTE — Progress Notes (Signed)
 Remote Loop Recorder Transmission

## 2024-02-19 ENCOUNTER — Ambulatory Visit: Payer: Self-pay | Admitting: Internal Medicine

## 2024-02-24 DIAGNOSIS — E669 Obesity, unspecified: Secondary | ICD-10-CM | POA: Diagnosis not present

## 2024-02-24 DIAGNOSIS — I10 Essential (primary) hypertension: Secondary | ICD-10-CM | POA: Diagnosis not present

## 2024-02-24 DIAGNOSIS — E1149 Type 2 diabetes mellitus with other diabetic neurological complication: Secondary | ICD-10-CM | POA: Diagnosis not present

## 2024-02-26 ENCOUNTER — Other Ambulatory Visit: Payer: Self-pay

## 2024-02-26 ENCOUNTER — Other Ambulatory Visit (HOSPITAL_COMMUNITY): Payer: Self-pay

## 2024-03-05 ENCOUNTER — Encounter

## 2024-03-17 ENCOUNTER — Encounter

## 2024-03-18 ENCOUNTER — Ambulatory Visit

## 2024-03-18 DIAGNOSIS — I6340 Cerebral infarction due to embolism of unspecified cerebral artery: Secondary | ICD-10-CM

## 2024-03-19 ENCOUNTER — Encounter

## 2024-03-19 LAB — CUP PACEART REMOTE DEVICE CHECK
Date Time Interrogation Session: 20251216230029
Implantable Pulse Generator Implant Date: 20210503

## 2024-03-19 NOTE — Progress Notes (Signed)
 Remote Loop Recorder Transmission

## 2024-03-23 ENCOUNTER — Other Ambulatory Visit: Payer: Self-pay

## 2024-03-23 ENCOUNTER — Other Ambulatory Visit (HOSPITAL_COMMUNITY): Payer: Self-pay

## 2024-03-24 ENCOUNTER — Other Ambulatory Visit: Payer: Self-pay

## 2024-03-25 ENCOUNTER — Other Ambulatory Visit: Payer: Self-pay

## 2024-03-27 ENCOUNTER — Other Ambulatory Visit: Payer: Self-pay

## 2024-03-29 ENCOUNTER — Ambulatory Visit: Payer: Self-pay | Admitting: Internal Medicine

## 2024-03-30 ENCOUNTER — Other Ambulatory Visit: Payer: Self-pay

## 2024-03-30 ENCOUNTER — Other Ambulatory Visit (HOSPITAL_COMMUNITY): Payer: Self-pay

## 2024-04-06 ENCOUNTER — Other Ambulatory Visit (HOSPITAL_COMMUNITY): Payer: Self-pay

## 2024-04-07 ENCOUNTER — Other Ambulatory Visit (HOSPITAL_COMMUNITY): Payer: Self-pay

## 2024-04-09 ENCOUNTER — Encounter

## 2024-04-17 ENCOUNTER — Encounter

## 2024-04-18 ENCOUNTER — Ambulatory Visit: Attending: Cardiology

## 2024-04-18 DIAGNOSIS — I6340 Cerebral infarction due to embolism of unspecified cerebral artery: Secondary | ICD-10-CM | POA: Diagnosis not present

## 2024-04-20 ENCOUNTER — Encounter

## 2024-04-21 LAB — CUP PACEART REMOTE DEVICE CHECK
Date Time Interrogation Session: 20260116230503
Implantable Pulse Generator Implant Date: 20210503

## 2024-04-22 ENCOUNTER — Other Ambulatory Visit (HOSPITAL_COMMUNITY): Payer: Self-pay

## 2024-04-23 ENCOUNTER — Ambulatory Visit: Payer: Self-pay | Admitting: Cardiology

## 2024-04-23 NOTE — Progress Notes (Signed)
 Remote Loop Recorder Transmission

## 2024-04-29 ENCOUNTER — Other Ambulatory Visit (HOSPITAL_COMMUNITY): Payer: Self-pay

## 2024-04-29 ENCOUNTER — Other Ambulatory Visit: Payer: Self-pay

## 2024-04-30 ENCOUNTER — Other Ambulatory Visit: Payer: Self-pay

## 2024-05-14 ENCOUNTER — Encounter

## 2024-05-18 ENCOUNTER — Encounter

## 2024-05-19 ENCOUNTER — Ambulatory Visit

## 2024-05-21 ENCOUNTER — Encounter

## 2024-06-18 ENCOUNTER — Encounter

## 2024-06-19 ENCOUNTER — Ambulatory Visit

## 2024-06-22 ENCOUNTER — Encounter

## 2024-07-19 ENCOUNTER — Encounter

## 2024-07-20 ENCOUNTER — Ambulatory Visit

## 2024-07-23 ENCOUNTER — Encounter

## 2024-08-19 ENCOUNTER — Encounter

## 2024-08-20 ENCOUNTER — Ambulatory Visit

## 2024-08-25 ENCOUNTER — Encounter

## 2024-08-27 ENCOUNTER — Encounter

## 2024-09-19 ENCOUNTER — Encounter

## 2024-09-20 ENCOUNTER — Ambulatory Visit

## 2024-09-24 ENCOUNTER — Encounter

## 2024-10-01 ENCOUNTER — Encounter

## 2024-11-05 ENCOUNTER — Encounter
